# Patient Record
Sex: Male | Born: 1950 | Race: White | Hispanic: No | Marital: Married | Smoking: Never smoker
Health system: Southern US, Community
[De-identification: ages and names within clinical notes are randomized; demographics above are authoritative.]

## PROBLEM LIST (undated history)

## (undated) DIAGNOSIS — I483 Typical atrial flutter: Secondary | ICD-10-CM

## (undated) DIAGNOSIS — M4800 Spinal stenosis, site unspecified: Secondary | ICD-10-CM

## (undated) DIAGNOSIS — M1990 Unspecified osteoarthritis, unspecified site: Secondary | ICD-10-CM

## (undated) DIAGNOSIS — F419 Anxiety disorder, unspecified: Secondary | ICD-10-CM

## (undated) DIAGNOSIS — I509 Heart failure, unspecified: Secondary | ICD-10-CM

## (undated) DIAGNOSIS — I252 Old myocardial infarction: Secondary | ICD-10-CM

## (undated) DIAGNOSIS — I2510 Atherosclerotic heart disease of native coronary artery without angina pectoris: Secondary | ICD-10-CM

## (undated) DIAGNOSIS — I4819 Other persistent atrial fibrillation: Secondary | ICD-10-CM

## (undated) DIAGNOSIS — U071 COVID-19: Secondary | ICD-10-CM

## (undated) DIAGNOSIS — E669 Obesity, unspecified: Secondary | ICD-10-CM

## (undated) DIAGNOSIS — E785 Hyperlipidemia, unspecified: Secondary | ICD-10-CM

## (undated) DIAGNOSIS — Z87442 Personal history of urinary calculi: Secondary | ICD-10-CM

## (undated) DIAGNOSIS — K219 Gastro-esophageal reflux disease without esophagitis: Secondary | ICD-10-CM

## (undated) DIAGNOSIS — I10 Essential (primary) hypertension: Secondary | ICD-10-CM

## (undated) DIAGNOSIS — I480 Paroxysmal atrial fibrillation: Secondary | ICD-10-CM

## (undated) DIAGNOSIS — G4730 Sleep apnea, unspecified: Secondary | ICD-10-CM

## (undated) DIAGNOSIS — R0600 Dyspnea, unspecified: Secondary | ICD-10-CM

## (undated) HISTORY — DX: Atherosclerotic heart disease of native coronary artery without angina pectoris: I25.10

## (undated) HISTORY — DX: Obesity, unspecified: E66.9

## (undated) HISTORY — DX: Essential (primary) hypertension: I10

## (undated) HISTORY — DX: Other persistent atrial fibrillation: I48.19

## (undated) HISTORY — PX: CHOLECYSTECTOMY: SHX55

## (undated) HISTORY — PX: OTHER SURGICAL HISTORY: SHX169

## (undated) HISTORY — DX: Hyperlipidemia, unspecified: E78.5

## (undated) HISTORY — DX: Old myocardial infarction: I25.2

## (undated) HISTORY — PX: LUMBAR LAMINECTOMY: SHX95

## (undated) HISTORY — PX: CORONARY ARTERY BYPASS GRAFT: SHX141

## (undated) HISTORY — DX: Sleep apnea, unspecified: G47.30

## (undated) HISTORY — DX: Unspecified osteoarthritis, unspecified site: M19.90

## (undated) HISTORY — PX: CARPAL TUNNEL RELEASE: SHX101

## (undated) HISTORY — DX: Spinal stenosis, site unspecified: M48.00

---

## 2002-10-05 DIAGNOSIS — I252 Old myocardial infarction: Secondary | ICD-10-CM

## 2002-10-05 HISTORY — DX: Old myocardial infarction: I25.2

## 2009-10-05 HISTORY — PX: BACK SURGERY: SHX140

## 2010-08-01 ENCOUNTER — Encounter: Admission: RE | Admit: 2010-08-01 | Discharge: 2010-08-01 | Payer: Self-pay | Admitting: Neurosurgery

## 2010-08-21 ENCOUNTER — Ambulatory Visit: Payer: Self-pay | Admitting: Cardiovascular Disease

## 2010-08-21 ENCOUNTER — Inpatient Hospital Stay (HOSPITAL_COMMUNITY)
Admission: RE | Admit: 2010-08-21 | Discharge: 2010-08-26 | Payer: Self-pay | Source: Home / Self Care | Admitting: Neurosurgery

## 2010-08-21 ENCOUNTER — Ambulatory Visit: Payer: Self-pay | Admitting: Internal Medicine

## 2010-08-25 HISTORY — PX: TRANSTHORACIC ECHOCARDIOGRAM: SHX275

## 2010-09-08 ENCOUNTER — Ambulatory Visit: Payer: Self-pay | Admitting: Internal Medicine

## 2010-09-08 DIAGNOSIS — F329 Major depressive disorder, single episode, unspecified: Secondary | ICD-10-CM

## 2010-09-08 DIAGNOSIS — E785 Hyperlipidemia, unspecified: Secondary | ICD-10-CM | POA: Insufficient documentation

## 2010-09-08 DIAGNOSIS — I2510 Atherosclerotic heart disease of native coronary artery without angina pectoris: Secondary | ICD-10-CM | POA: Insufficient documentation

## 2010-09-08 DIAGNOSIS — I10 Essential (primary) hypertension: Secondary | ICD-10-CM | POA: Insufficient documentation

## 2010-09-08 DIAGNOSIS — I509 Heart failure, unspecified: Secondary | ICD-10-CM | POA: Insufficient documentation

## 2010-09-08 DIAGNOSIS — F32A Depression, unspecified: Secondary | ICD-10-CM | POA: Insufficient documentation

## 2010-09-08 DIAGNOSIS — K219 Gastro-esophageal reflux disease without esophagitis: Secondary | ICD-10-CM | POA: Insufficient documentation

## 2010-09-10 ENCOUNTER — Ambulatory Visit: Payer: Self-pay | Admitting: Cardiology

## 2010-09-10 ENCOUNTER — Encounter: Payer: Self-pay | Admitting: Internal Medicine

## 2010-09-11 DIAGNOSIS — I219 Acute myocardial infarction, unspecified: Secondary | ICD-10-CM | POA: Insufficient documentation

## 2010-09-11 DIAGNOSIS — M4800 Spinal stenosis, site unspecified: Secondary | ICD-10-CM | POA: Insufficient documentation

## 2010-09-11 DIAGNOSIS — M1990 Unspecified osteoarthritis, unspecified site: Secondary | ICD-10-CM | POA: Insufficient documentation

## 2010-09-12 ENCOUNTER — Ambulatory Visit: Payer: Self-pay | Admitting: Pulmonary Disease

## 2010-09-12 ENCOUNTER — Encounter: Payer: Self-pay | Admitting: Pulmonary Disease

## 2010-09-12 ENCOUNTER — Ambulatory Visit (HOSPITAL_BASED_OUTPATIENT_CLINIC_OR_DEPARTMENT_OTHER)
Admission: RE | Admit: 2010-09-12 | Discharge: 2010-09-12 | Payer: Self-pay | Source: Home / Self Care | Attending: Pulmonary Disease | Admitting: Pulmonary Disease

## 2010-09-12 DIAGNOSIS — G4733 Obstructive sleep apnea (adult) (pediatric): Secondary | ICD-10-CM | POA: Insufficient documentation

## 2010-09-22 ENCOUNTER — Telehealth (INDEPENDENT_AMBULATORY_CARE_PROVIDER_SITE_OTHER): Payer: Self-pay | Admitting: *Deleted

## 2010-10-01 ENCOUNTER — Ambulatory Visit: Payer: Self-pay | Admitting: Pulmonary Disease

## 2010-10-13 ENCOUNTER — Telehealth: Payer: Self-pay | Admitting: Pulmonary Disease

## 2010-10-16 ENCOUNTER — Ambulatory Visit: Payer: Self-pay | Admitting: Cardiology

## 2010-11-04 NOTE — Assessment & Plan Note (Signed)
Summary: New / [facility] / [facility] sched appt/cd   Vital Signs:  Patient profile:   60 year old male Height:      66 inches Weight:      233 pounds BMI:     37.74 O2 Sat:      97 % on Room air Temp:     98.0 degrees F oral Pulse rate:   56 / minute Pulse rhythm:   regular BP sitting:   130 / 66  (left arm) Cuff size:   large  Vitals Entered By: Bill Salinas CMA (September 08, 2010 4:02 PM)  O2 Flow:  Room air  Primary Care Provider:  Etta Grandchild MD   History of Present Illness: New to me he needs a new PCP. He is one month s/p microsurgery of L3 L4 with Dr. Lovell Sheehan for spinal stenosis and had an episode of CHF post-op but is doing much better now.  Hypertension History:      He complains of peripheral edema, but denies headache, chest pain, palpitations, orthopnea, PND, visual symptoms, neurologic problems, syncope, and side effects from treatment.  He notes no problems with any antihypertensive medication side effects.        Positive major cardiovascular risk factors include male age 105 years old or older, hyperlipidemia, and hypertension.  Negative major cardiovascular risk factors include negative family history for ischemic heart disease and non-tobacco-user status.        Positive history for target organ damage include ASHD (either angina/prior MI/prior CABG) and cardiac end organ damage (either CHF or LVH).  Further assessment for target organ damage reveals no history of stroke/TIA, peripheral vascular disease, renal insufficiency, or hypertensive retinopathy.     Preventive Screening-Counseling & Management  Alcohol-Tobacco     Alcohol drinks/day: <1     Alcohol type: beer     >5/day in last 3 mos: no     Alcohol Counseling: not indicated; use of alcohol is not excessive or problematic     Feels need to cut down: no     Feels annoyed by complaints: no     Feels guilty re: drinking: no     Needs 'eye opener' in am: no     Smoking Status: never     Tobacco  Counseling: not indicated; no tobacco use  Caffeine-Diet-Exercise     Does Patient Exercise: no  Hep-HIV-STD-Contraception     Hepatitis Risk: no risk noted     HIV Risk: no risk noted     STD Risk: no risk noted      Sexual History:  currently monogamous.        Drug Use:  no.        Blood Transfusions:  no.    Clinical Review Panels:  Prevention   Last Colonoscopy:  normal (10/05/2005)  Immunizations   Last Tetanus Booster:  Tdap (09/08/2010)   Last Flu Vaccine:  Historical (07/05/2010)   Last Pneumovax:  Historical (08/26/2010)  Diabetes Management   Last Flu Vaccine:  Historical (07/05/2010)   Last Pneumovax:  Historical (08/26/2010)   Medications Prior to Update: 1)  None  Current Medications (verified): 1)  Metoprolol Succinate 50 Mg Xr24h-Tab (Metoprolol Succinate) .... Take 1 Tablet By Mouth Two Times A Day 2)  Amlodipine Besylate 5 Mg Tabs (Amlodipine Besylate) .... Take 1 Tablet By Mouth Two Times A Day 3)  Lipitor 20 Mg Tabs (Atorvastatin Calcium) .... Take 1 Tablet By Mouth Two Times A Day 4)  Diovan  320 Mg Tabs (Valsartan) 5)  Citalopram Hydrobromide 20 Mg Tabs (Citalopram Hydrobromide) .... Take 1 Tablet By Mouth Once A Day 6)  Ecotrin 325 Mg Tbec (Aspirin) .... Take 1 Tablet By Mouth Once A Day 7)  Omeprazole 20 Mg Tbec (Omeprazole) .... 3 Qdtab 8)  Fish Oil 1000 9)  Coq-10 400mg  .... Take 1 Tablet By Mouth Once A Day 10)  Centrium Silver .... Take 1 Tablet By Mouth Once A Day 11)  Furosemide 40 Mg Tabs (Furosemide) .... One By Mouth Two Times A Day  Allergies (verified): 1)  ! Vancomycin  Past History:  Past Medical History: Congestive heart failure Coronary artery disease Depression GERD Hyperlipidemia Hypertension  Past Surgical History: Coronary artery bypass graft Cholecystectomy Hemorrhoidectomy Lumbar laminectomy  Family History: Family History Breast cancer 1st degree relative <50 Family History High cholesterol Family  History Hypertension Family History of Stroke M 1st degree relative <50  Social History: Occupation: Personnel officer Married Never Smoked Alcohol use-yes Drug use-no Regular exercise-no Smoking Status:  never Drug Use:  no Does Patient Exercise:  no Hepatitis Risk:  no risk noted HIV Risk:  no risk noted STD Risk:  no risk noted Sexual History:  currently monogamous Blood Transfusions:  no  Review of Systems       The patient complains of weight gain and peripheral edema.  The patient denies anorexia, fever, weight loss, chest pain, syncope, dyspnea on exertion, prolonged cough, headaches, hemoptysis, abdominal pain, hematuria, unusual weight change, and enlarged lymph nodes.    Physical Exam  General:  alert, well-developed, well-nourished, well-hydrated, appropriate dress, normal appearance, healthy-appearing, cooperative to examination, and overweight-appearing.   Head:  normocephalic, atraumatic, no abnormalities observed, and no abnormalities palpated.   Eyes:  vision grossly intact, pupils equal, pupils round, and pupils reactive to light.   Mouth:  Oral mucosa and oropharynx without lesions or exudates.  Teeth in good repair. Neck:  supple, full ROM, no masses, no thyromegaly, no thyroid nodules or tenderness, no JVD, normal carotid upstroke, no carotid bruits, no cervical lymphadenopathy, and no neck tenderness.   Lungs:  normal respiratory effort, no intercostal retractions, no accessory muscle use, normal breath sounds, no dullness, no fremitus, no crackles, and no wheezes.   Heart:  normal rate, regular rhythm, no murmur, no gallop, no rub, and no JVD.   Abdomen:  soft, non-tender, normal bowel sounds, no distention, no masses, no guarding, no rigidity, no rebound tenderness, no abdominal hernia, no inguinal hernia, no hepatomegaly, and no splenomegaly.   Msk:  normal ROM, no joint tenderness, no joint swelling, no joint warmth, no redness over joints, no joint deformities, no  joint instability, and no crepitation.   Pulses:  R and L carotid,radial,femoral,dorsalis pedis and posterior tibial pulses are full and equal bilaterally Extremities:  1+ left pedal edema and 1+ right pedal edema.   Neurologic:  alert & oriented X3, cranial nerves II-XII intact, strength normal in all extremities, sensation intact to light touch, sensation intact to pinprick, gait normal, DTRs symmetrical and normal, and Romberg negative.   Skin:  turgor normal, color normal, no rashes, no suspicious lesions, no ecchymoses, no petechiae, no purpura, no ulcerations, and no edema.   Cervical Nodes:  no anterior cervical adenopathy and no posterior cervical adenopathy.   Psych:  Cognition and judgment appear intact. Alert and cooperative with normal attention span and concentration. No apparent delusions, illusions, hallucinations   Impression & Recommendations:  Problem # 1:  HYPERTENSION (ICD-401.9) Assessment Improved  His  updated medication list for this problem includes:    Metoprolol Succinate 50 Mg Xr24h-tab (Metoprolol succinate) .Marland Kitchen... Take 1 tablet by mouth two times a day    Amlodipine Besylate 5 Mg Tabs (Amlodipine besylate) .Marland Kitchen... Take 1 tablet by mouth two times a day    Diovan 320 Mg Tabs (Valsartan)    Furosemide 40 Mg Tabs (Furosemide) ..... One by mouth two times a day  BP today: 130/66  10 Yr Risk Heart Disease: [name]  Problem # 2:  CONGESTIVE HEART FAILURE (ICD-428.0) Assessment: Unchanged  His updated medication list for this problem includes:    Metoprolol Succinate 50 Mg Xr24h-tab (Metoprolol succinate) .Marland Kitchen... Take 1 tablet by mouth two times a day    Diovan 320 Mg Tabs (Valsartan)    Ecotrin 325 Mg Tbec (Aspirin) .Marland Kitchen... Take 1 tablet by mouth once a day    Furosemide 40 Mg Tabs (Furosemide) ..... One by mouth two times a day  Complete Medication List: 1)  Metoprolol Succinate 50 Mg Xr24h-tab (Metoprolol succinate) .... Take 1 tablet by mouth two times a day 2)   Amlodipine Besylate 5 Mg Tabs (Amlodipine besylate) .... Take 1 tablet by mouth two times a day 3)  Lipitor 20 Mg Tabs (Atorvastatin calcium) .... Take 1 tablet by mouth two times a day 4)  Diovan 320 Mg Tabs (Valsartan) 5)  Citalopram Hydrobromide 20 Mg Tabs (Citalopram hydrobromide) .... Take 1 tablet by mouth once a day 6)  Ecotrin 325 Mg Tbec (Aspirin) .... Take 1 tablet by mouth once a day 7)  Omeprazole 20 Mg Tbec (Omeprazole) .... 3 qdtab 8)  Fish Oil 1000  9)  Coq-10 400mg   .... Take 1 tablet by mouth once a day 10)  Centrium Silver  .... Take 1 tablet by mouth once a day 11)  Furosemide 40 Mg Tabs (Furosemide) .... One by mouth two times a day  Other Orders: Tdap => 91yrs IM (16109) Admin 1st Vaccine (60454)  Hypertension Assessment/Plan:      The patient's hypertensive risk group is category C: Target organ damage and/or diabetes.  Today's blood pressure is 130/66.  His blood pressure goal is < 140/90.  Immunization & Chemoprophylaxis:    Tetanus vaccine: Tdap  (09/08/2010)    Influenza vaccine: Historical  (07/05/2010)    Pneumovax: Historical  (08/26/2010)  Patient Instructions: 1)  Please schedule a follow-up appointment in 3 months. 2)  Limit your Sodium (Salt) to less than 4 grams a day (slightly less than 1 teaspoon) to prevent fluid retention, swelling, or worsening or symptoms. 3)  It is important that you exercise regularly at least 20 minutes 5 times a week. If you develop chest pain, have severe difficulty breathing, or feel very tired , stop exercising immediately and seek medical attention. 4)  You need to lose weight. Consider a lower calorie diet and regular exercise.  5)  Check your Blood Pressure regularly. If it is above 130/80: you should make an appointment.   Orders Added: 1)  Tdap => 79yrs IM [90715] 2)  Admin 1st Vaccine [90471] 3)  New Patient Level III [99203]   Immunization History:  Influenza Immunization History:    Influenza:  historical  (07/05/2010)  Pneumovax Immunization History:    Pneumovax:  historical (08/26/2010)  Immunizations Administered:  Tetanus Vaccine:    Vaccine Type: Tdap    Site: left deltoid    Mfr: GlaxoSmithKline    Dose: 0.5 ml    Route: IM    Given by: Alvy Beal  Archie CMA    Exp. Date: 07/24/2012    Lot #: [id number]    VIS given: 08/22/08 version given September 08, 2010.   Immunization History:  Influenza Immunization History:    Influenza:  Historical (07/05/2010)  Pneumovax Immunization History:    Pneumovax:  Historical (08/26/2010)  Immunizations Administered:  Tetanus Vaccine:    Vaccine Type: Tdap    Site: left deltoid    Mfr: GlaxoSmithKline    Dose: 0.5 ml    Route: IM    Given by: Rock Nephew CMA    Exp. Date: 07/24/2012    Lot #: [id number]    VIS given: 08/22/08 version given September 08, 2010.  Preventive Care Screening  Colonoscopy:    Date:  10/05/2005    Results:  normal   Last Tetanus Booster:    Date:  10/05/1993    Results:  Historical

## 2010-11-05 ENCOUNTER — Ambulatory Visit: Payer: Self-pay | Admitting: Pulmonary Disease

## 2010-11-05 ENCOUNTER — Ambulatory Visit: Admit: 2010-11-05 | Payer: Self-pay | Admitting: Pulmonary Disease

## 2010-11-06 ENCOUNTER — Telehealth: Payer: Self-pay | Admitting: Pulmonary Disease

## 2010-11-06 NOTE — Assessment & Plan Note (Signed)
Summary: consult for possible osa   Visit Type:  Initial Consult Copy to:  [name] Peter MD Primary Sala Tague/Referring Skyelar Halliday:  Etta Grandchild MD  CC:  Sleep Consult. pt c/o not sleeping and snoring. Marland Kitchen  History of Present Illness: The pt is a 60y/lo male who I have been asked to see for possible osa.  He recently had back surgery, complicated by CHF, and the question was raised about osa.  The pt has been noted to have loud snoring, pauses in his breathing during sleep, and gasping arousals.  He goes to bed at 10pm, and arises at 515am.  He has frequent awakenings at night, and is not rested in the am's upon arising.  He works as an Personnel officer, and stays very busy.  However, he has definite sleep pressure during the day with periods of inactivity.  He will doze watching tv, reading, and has sleep pressure while driving longer distances.  His weight is up 20 pounds over the last 2 years, and his epworth score today is abnormal at 12.  Current Medications (verified): 1)  Metoprolol Succinate 50 Mg Xr24h-Tab (Metoprolol Succinate) .... Take 1 Tablet By Mouth Two Times A Day 2)  Amlodipine Besylate 5 Mg Tabs (Amlodipine Besylate) .... Take 1 Tablet By Mouth Two Times A Day 3)  Lipitor 20 Mg Tabs (Atorvastatin Calcium) .... Once Daily 4)  Diovan 320 Mg Tabs (Valsartan) .... Once Daily 5)  Citalopram Hydrobromide 20 Mg Tabs (Citalopram Hydrobromide) .... Take 1 Tablet By Mouth Once A Day 6)  Ecotrin 325 Mg Tbec (Aspirin) .... Take 1 Tablet By Mouth Once A Day 7)  Omeprazole 20 Mg Tbec (Omeprazole) .... One Tablet Once Every 3 Days 8)  Fish Oil 1000 Mg Caps (Omega-3 Fatty Acids) .... 3 Once Daily 9)  Coq-10 400mg  .... Take 1 Tablet By Mouth Once A Day 10)  Centrium Silver .... Take 1 Tablet By Mouth Once A Day 11)  Furosemide 40 Mg Tabs (Furosemide) .... Once Daily  Allergies (verified): 1)  ! 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 Family History Breast cancer:mother Family History High cholesterol Family History Hypertension: father and mother Family History of Stroke M 1st degree relative <50 CAD--father CVA--father  Social History: Reviewed history from 09/08/2010 and no changes required. Occupation: Personnel officer Married Never Smoked Alcohol use-yes Drug use-no Regular exercise-no  Review of Systems       The patient complains of shortness of breath with activity, acid heartburn, weight change, itching, anxiety, hand/feet swelling, and joint stiffness or pain.  The patient denies shortness of breath at rest, productive cough, non-productive cough, coughing up blood, chest pain, irregular heartbeats, indigestion, loss of appetite, abdominal pain, difficulty swallowing, sore throat, tooth/dental problems, headaches, nasal congestion/difficulty breathing through nose, sneezing, ear ache, depression, rash, change in color of mucus, and fever.    Vital Signs:  Patient profile:   60 year old male Height:      66 inches Weight:      230.25 pounds BMI:     37.30 O2 Sat:  97 % on Room air Temp:     98.1 degrees F oral Pulse rate:   53 / minute BP sitting:   122 / 70  (left arm) Cuff size:   large  Vitals Entered By: Carver Fila (September 12, 2010 8:46 AM)  O2 Flow:  Room air CC: Sleep Consult. pt c/o not sleeping, snoring.  Comments meds and allergies  updated Phone number updated Carver Fila  September 12, 2010 8:46 AM    Physical Exam  General:  obese male in nad Eyes:  PERRLA and EOMI.   Nose:  narrowed with turbinate hypertrophy, no discharge or purulence Mouth:  small post. pharyngeal space, elongation of soft palate and uvula. Neck:  no jvd, tmg, LN Lungs:  clear to auscultation, no wheezing or crackles. Heart:  rrr, no mrg Abdomen:  soft and nontender, bs+ Extremities:  mild pedal edema, no cyanosis  pulses intact distally Neurologic:  alert and oriented, does not appear sleepy currently. moves all 4.   Impression & Recommendations:  Problem # 1:  OBSTRUCTIVE SLEEP APNEA (ICD-327.23) the pt's history is very suspicious for significant osa.  He has loud snoring, nonrestorative sleep, daytime sleepiness, and is obese.  He also has underlying CV disease that puts him at risk for complications.  I have had a long discussion with the pt about sleep apnea, including its impact on QOL and CV health.  I think he needs to have a sleep study for diagnosis, and the pt is agreeable.    Medications Added to Medication List This Visit: 1)  Lipitor 20 Mg Tabs (Atorvastatin calcium) .... Once daily 2)  Diovan 320 Mg Tabs (Valsartan) .... Once daily 3)  Omeprazole 20 Mg Tbec (Omeprazole) .... One tablet once every 3 days 4)  Fish Oil 1000 Mg Caps (Omega-3 fatty acids) .... 3 once daily 5)  Furosemide 40 Mg Tabs (Furosemide) .... Once daily  Other Orders: Sleep Disorder Referral (Sleep Disorder) Consultation Level IV (16109)  Patient Instructions: 1)  will schedule for sleep study, and see you back afterwards to review the results. 2)  work on weight loss

## 2010-11-06 NOTE — Assessment & Plan Note (Signed)
Summary: rov to review sleep study   Visit Type:  Follow-up Copy to:  [name] Peter MD Primary Provider/Referring Provider:  Etta Grandchild MD  CC:  pt here to discuss sleep study results..  History of Present Illness: the pt comes in for review of his recent sleep study.  He was found to have an AHI of 72/hr and desat as low as 79%.  I have reviewed the study in detail with he and his wife, and answered all of their questions.    Current Medications (verified): 1)  Metoprolol Succinate 50 Mg Xr24h-Tab (Metoprolol Succinate) .... Take 1 Tablet By Mouth Two Times A Day 2)  Amlodipine Besylate 5 Mg Tabs (Amlodipine Besylate) .... Take 1 Tablet By Mouth Two Times A Day 3)  Lipitor 20 Mg Tabs (Atorvastatin Calcium) .... Once Daily 4)  Diovan 320 Mg Tabs (Valsartan) .... Once Daily 5)  Citalopram Hydrobromide 20 Mg Tabs (Citalopram Hydrobromide) .... Take 1 Tablet By Mouth Once A Day 6)  Ecotrin 325 Mg Tbec (Aspirin) .... Take 1 Tablet By Mouth Once A Day 7)  Omeprazole 20 Mg Tbec (Omeprazole) .... One Tablet Once Every 3 Days 8)  Fish Oil 1000 Mg Caps (Omega-3 Fatty Acids) .... 3 Once Daily 9)  Coq-10 400mg  .... Take 1 Tablet By Mouth Once A Day 10)  Centrium Silver .... Take 1 Tablet By Mouth Once A Day 11)  Furosemide 40 Mg Tabs (Furosemide) .... Once Daily  Allergies (verified): 1)  ! Vancomycin  Review of Systems       The patient complains of joint stiffness or pain.  The patient denies shortness of breath with activity, shortness of breath at rest, productive cough, non-productive cough, coughing up blood, chest pain, irregular heartbeats, acid heartburn, indigestion, loss of appetite, weight change, abdominal pain, difficulty swallowing, sore throat, tooth/dental problems, headaches, nasal congestion/difficulty breathing through nose, sneezing, itching, ear ache, anxiety, depression, hand/feet swelling, rash, change in color of mucus, and fever.    Vital Signs:  Patient profile:    60 year old male Height:      66 inches Weight:      231.13 pounds O2 Sat:      94 % on Room air Temp:     97.6 degrees F oral Pulse rate:   51 / minute BP sitting:   122 / 58  (left arm) Cuff size:   large  Vitals Entered By: Carver Fila (October 01, 2010 4:11 PM)  O2 Flow:  Room air CC: pt here to discuss sleep study results. Comments meds and allergies updated Phone number updated Carver Fila  October 01, 2010 4:16 PM     Physical Exam  General:  ow male in nad Nose:  no purulence or discharge noted. Extremities:  no significant edema, no cyanosis  Neurologic:  alert, oriented, moves all 4.   Impression & Recommendations:  Problem # 1:  OBSTRUCTIVE SLEEP APNEA (ICD-327.23) the pt has severe osa by his recent sleep study.  I have recommended treatment with cpap while working on weight loss, and the pt is agreeable.  I will set the patient up on cpap at a moderate pressure level to allow for desensitization, and will troubleshoot the device over the next 4-6weeks if needed.  The pt is to call me if having issues with tolerance.  Will then optimize the pressure once patient is able to wear cpap on a consistent basis.  Other Orders: Est. Patient Level III (04540) DME Referral (DME)  Patient  Instructions: 1)  will start on cpap at a moderate level.  Please call if having issues with tolerance 2)  work on weight loss 3)  followup with me in 5 weeks.

## 2010-11-06 NOTE — Progress Notes (Signed)
Summary: sleep test results---APPT SCHEDULED  Phone Note Call from Patient Call back at Home Phone [phone] Call back at Work Phone    Caller: Patient Call For: clance Reason for Call: Lab or Test Results Summary of Call: Request test results. Initial call taken by: Darletta Moll,  September 22, 2010 3:17 PM  Follow-up for Phone Call        Pt had sleep study doen on 09-12-10 and is requesting results. Please advsie if the results are in [facility] look-at folder. Thanks. Carron Curie CMA  September 22, 2010 4:30 PM   [facility], have you read this pt's sleep study results?  Please advise.  Thanks.  Aundra Millet Reynolds LPN  September 23, 2010 11:25 AM   Additional Follow-up for Phone Call Additional follow up Details #1::        go ahead and make a f/u visit with him to review.Marland KitchenMarland Kitchen?next week.  can add in my RN approval if needed. Additional Follow-up by: Barbaraann Share MD,  September 23, 2010 12:33 PM    Additional Follow-up for Phone Call Additional follow up Details #2::    Pt will come for OV to discuss sleep results on Wed., 10/01/2010 @ 4pm with KC.Michel Bickers [facility]  September 23, 2010 1:16 PM

## 2010-11-06 NOTE — Letter (Signed)
Summary: [facility]   Imported By: Lester Lathrop 09/22/2010 10:20:30  _____________________________________________________________________  External Attachment:    Type:   Image     Comment:   External Document

## 2010-11-06 NOTE — Progress Notes (Signed)
Summary: Sinus and eye infection  Phone Note Call from Patient Call back at Home Phone [phone] Call back at [facility] [phone]   Caller: Patient Summary of Call: Patient says using CPAP has resulted in sinus and eye infection.  [name] [name] at [facility] instructed patient to call Dr Shelle Iron for care.  Patient wants call back. Initial call taken by: Leonette Monarch,  October 13, 2010 2:11 PM  Follow-up for Phone Call        Spoke with pt.  He states that he has started using CPAP 3 days ago.  He states that ever since he started using he has had "eyellitis"- red, swollen eyes and also "sinuses are hurting"- he says that his face seems to be swollen when he wakes up, esp around his eyes.  He states that he has "stopped up my nose" before going to sleep and this has helped some.  Pls advise thanks Follow-up by: Vernie Murders,  October 13, 2010 3:43 PM  Additional Follow-up for Phone Call Additional follow up Details #1::        make sure that he has turned his heater on his humidifier up to keep nose moist.  The most common cause of "eyelitis"  is pulling his mask too tight or if it is too loose and blowing air into his eyes.  Needs to try adjusting his mask, turning heat up on humidifier, and using visine for his eyes.  If continues, needs ov. Additional Follow-up by: Barbaraann Share MD,  October 13, 2010 5:19 PM    Additional Follow-up for Phone Call Additional follow up Details #2::    Pt aware of above. Pt states that Mardelle Matte advised to try a new hose. Pt will try new hose and KC recs and will callback if needed. [name] [name] CMA  October 13, 2010 5:22 PM

## 2010-11-12 NOTE — Progress Notes (Signed)
Summary: nos appt  Phone Note Call from Patient   Caller: [email or web address]  Call For: [name] Summary of Call: Rsc nos from 2/1 to 2/14. Initial call taken by: Darletta Moll,  November 06, 2010 10:14 AM

## 2010-11-18 ENCOUNTER — Ambulatory Visit (INDEPENDENT_AMBULATORY_CARE_PROVIDER_SITE_OTHER): Payer: BC Managed Care – PPO | Admitting: Pulmonary Disease

## 2010-11-18 ENCOUNTER — Encounter: Payer: Self-pay | Admitting: Pulmonary Disease

## 2010-11-18 DIAGNOSIS — G4733 Obstructive sleep apnea (adult) (pediatric): Secondary | ICD-10-CM

## 2010-11-19 ENCOUNTER — Encounter: Payer: Self-pay | Admitting: Pulmonary Disease

## 2010-12-02 NOTE — Letter (Signed)
Summary: CMN for CPAP Supplies/[facility]  CMN for CPAP Supplies/[facility]   Imported By: Sherian Rein 11/26/2010 11:40:27  _____________________________________________________________________  External Attachment:    Type:   Image     Comment:   External Document

## 2010-12-02 NOTE — Assessment & Plan Note (Signed)
Summary: rov for management of osa   Copy to:  [name] Peter MD Primary Provider/Referring Provider:  Etta Grandchild MD  CC:  f/u appt for OSA.  States he is wearing his cpap machine every night.  Approx 7 hours per night.  States he still has problems with air blowing in his "sinuses and eyes" but states it has improved.  Marland Kitchen  History of Present Illness: the pt comes in today for f/u of his osa.  He has been on cpap since last visit, and feels that his sleep and daytime alertness is much improved.  He has been wearing cpap compliantly by his download, and his AHI is much improved.  He has no significant mask leaks.  He does c/o air "shooting into my tear ducts", causing eye irritation.  He also has been having ongoing nasal congestion despite adjustments to his heated humidifier.  He has known nasal obstruction, but has not responded to nasal ICS in the past.  He feels these irritate his nose more than anything else.    Medications Prior to Update: 1)  Metoprolol Succinate 50 Mg Xr24h-Tab (Metoprolol Succinate) .... Take 1 Tablet By Mouth Two Times A Day 2)  Amlodipine Besylate 5 Mg Tabs (Amlodipine Besylate) .... Take 1 Tablet By Mouth Two Times A Day 3)  Lipitor 20 Mg Tabs (Atorvastatin Calcium) .... Once Daily 4)  Diovan 320 Mg Tabs (Valsartan) .... Once Daily 5)  Citalopram Hydrobromide 20 Mg Tabs (Citalopram Hydrobromide) .... Take 1 Tablet By Mouth Once A Day 6)  Ecotrin 325 Mg Tbec (Aspirin) .... Take 1 Tablet By Mouth Once A Day 7)  Omeprazole 20 Mg Tbec (Omeprazole) .... One Tablet Once Every 3 Days 8)  Fish Oil 1000 Mg Caps (Omega-3 Fatty Acids) .... 3 Once Daily 9)  Coq-10 400mg  .... Take 1 Tablet By Mouth Once A Day 10)  Centrium Silver .... Take 1 Tablet By Mouth Once A Day 11)  Furosemide 40 Mg Tabs (Furosemide) .... Once Daily  Allergies (verified): 1)  ! Vancomycin  Past History:  Past medical, surgical, family and social histories (including risk factors) reviewed, and no  changes noted (except as noted below).  Past Medical History: Reviewed history from 09/12/2010 and no changes required.  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 Coronary artery bypass graft Cholecystectomy Hemorrhoidectomy Lumbar laminectomy biltaeral carpal tunnel surgery fistula surgery  Family History: Reviewed history from 09/12/2010 and no changes required. Family History Breast cancer:mother Family History High cholesterol Family History Hypertension: father and mother Family History of Stroke M 1st degree relative <50 CAD--father CVA--father  Social History: Reviewed history from 09/08/2010 and no changes required. Occupation: Personnel officer Married Never Smoked Alcohol use-yes Drug use-no Regular exercise-no  Review of Systems       The patient complains of nasal congestion/difficulty breathing through nose.  The patient denies shortness of breath with activity, shortness of breath at rest, productive cough, non-productive cough, coughing up blood, chest pain, irregular heartbeats, acid heartburn, indigestion, loss of appetite, weight change, abdominal pain, difficulty swallowing, sore throat, tooth/dental problems, headaches, sneezing, itching, ear ache, anxiety, depression, hand/feet swelling, joint stiffness or pain, rash, change in color of mucus, and fever.    Vital Signs:  Patient profile:   60 year old male Height:      66 inches Weight:      233.50 pounds O2 Sat:      96 % on Room air Temp:     97.7 degrees F oral Pulse rate:   48 / minute BP sitting:    134 / 72  (right arm) Cuff size:   large  Vitals Entered By: Arman Filter LPN (November 18, 2010 10:45 AM)  O2 Flow:  Room air CC: f/u appt for OSA.  States he is wearing his cpap machine every night.  Approx 7 hours per night.  States he still has problems with air blowing in his "sinuses and eyes" but states it has improved.   Comments Medications reviewed with patient Arman Filter LPN  November 18, 2010 10:52 AM    Physical Exam  General:  ow male in nad Nose:  significant edema or turbs, with significant obstruction bilat.   no skin breakdown or pressure necrosis from cpap mask Mouth:  clear, no exudates. Lungs:  clear Heart:  rrr Extremities:  mild edema, no cyanosis  Neurologic:  alert, appears more awake, moves all 4 without defiict.   Impression & Recommendations:  Problem # 1:  OBSTRUCTIVE SLEEP APNEA (ICD-327.23) the pt has adapted well to the cpap, and feels he is symptomatically much improved.  He is having issues with nasal congestion and "air thru tear duct", both of which I suspect are due to his anatomical abnormalities in his nose with obvious obstruction.  He has adjusted the heat on his humidifier appropriately.  We can work on nasal hygiene, but ultimately may require surgery to fix this issue.  He will consider.  In the meantime, will work on pressure optimization with the auto setting on his machine. Care Plan:  At this point, will arrange for the patient's machine to be changed over to auto mode for 2 weeks to optimize their pressure.  I will review the downloaded data once sent by dme, and also evaluate for compliance, leaks, and residual osa.  I will call the patient and dme to discuss the results, and have the patient's machine set appropriately.  This will serve as the pt's cpap pressure titration.  Other Orders: Est. Patient Level IV (16109) DME Referral (DME)  Patient Instructions: 1)  try afrin over the counter, 2 sprays each nostril each night  before putting cpap on. 2)  think about having nasal surgery to allow cpap to work more effectively without issues 3)  work on weight loss 4)  will have your dme put your machine on auto for 2 weeks with download to me to optimize your pressure. 5)  if you are doing well, followup with me in 6mos.

## 2010-12-16 LAB — CBC
MCH: 29.2 pg (ref 26.0–34.0)
MCHC: 34 g/dL (ref 30.0–36.0)
MCHC: 34.9 g/dL (ref 30.0–36.0)
Platelets: 203 10*3/uL (ref 150–400)
Platelets: 260 10*3/uL (ref 150–400)
RDW: 13.2 % (ref 11.5–15.5)

## 2010-12-16 LAB — MRSA PCR SCREENING: MRSA by PCR: NEGATIVE

## 2010-12-16 LAB — BASIC METABOLIC PANEL
BUN: 28 mg/dL — ABNORMAL HIGH (ref 6–23)
CO2: 28 mEq/L (ref 19–32)
CO2: 31 mEq/L (ref 19–32)
CO2: 32 mEq/L (ref 19–32)
Calcium: 8.6 mg/dL (ref 8.4–10.5)
Calcium: 9.9 mg/dL (ref 8.4–10.5)
Chloride: 98 mEq/L (ref 96–112)
Creatinine, Ser: 0.96 mg/dL (ref 0.4–1.5)
Creatinine, Ser: 1.51 mg/dL — ABNORMAL HIGH (ref 0.4–1.5)
GFR calc Af Amer: >60 mL/min (ref >60)
GFR calc Af Amer: >60 mL/min (ref >60)
GFR calc Af Amer: >60 mL/min (ref >60)
GFR calc non Af Amer: 48 mL/min — ABNORMAL LOW (ref >60)
GFR calc non Af Amer: >60 mL/min (ref >60)
GFR calc non Af Amer: >60 mL/min (ref >60)
Glucose, Bld: 154 mg/dL — ABNORMAL HIGH (ref 70–99)
Glucose, Bld: 93 mg/dL (ref 70–99)
Glucose, Bld: 99 mg/dL (ref 70–99)
Potassium: 3.4 mEq/L — ABNORMAL LOW (ref 3.5–5.1)
Potassium: 4.2 mEq/L (ref 3.5–5.1)
Sodium: 137 mEq/L (ref 135–145)
Sodium: 139 mEq/L (ref 135–145)

## 2010-12-16 LAB — COMPREHENSIVE METABOLIC PANEL
ALT: 16 U/L (ref 0–53)
Albumin: 2.9 g/dL — ABNORMAL LOW (ref 3.5–5.2)
Alkaline Phosphatase: 64 U/L (ref 39–117)
Glucose, Bld: 123 mg/dL — ABNORMAL HIGH (ref 70–99)
Potassium: 3.8 mEq/L (ref 3.5–5.1)
Sodium: 137 mEq/L (ref 135–145)
Total Protein: 6.2 g/dL (ref 6.0–8.3)

## 2010-12-16 LAB — BLOOD GAS, ARTERIAL
Acid-Base Excess: 5.3 mmol/L — ABNORMAL HIGH (ref 0.0–2.0)
Acid-Base Excess: 9.2 mmol/L — ABNORMAL HIGH (ref 0.0–2.0)
Bicarbonate: 28.9 mEq/L — ABNORMAL HIGH (ref 20.0–24.0)
Bicarbonate: 30.8 mEq/L — ABNORMAL HIGH (ref 20.0–24.0)
Bicarbonate: 33.5 mEq/L — ABNORMAL HIGH (ref 20.0–24.0)
Drawn by: 29017
O2 Content: 4 L/min
O2 Saturation: 94.6 %
Patient temperature: 98.6
TCO2: 32.6 mmol/L (ref 0–100)
TCO2: 35 mmol/L (ref 0–100)
pCO2 arterial: 48.7 mmHg — ABNORMAL HIGH (ref 35.0–45.0)
pCO2 arterial: 59.3 mmHg (ref 35.0–45.0)
pO2, Arterial: 74.6 mmHg — ABNORMAL LOW (ref 80.0–100.0)
pO2, Arterial: 82.2 mmHg (ref 80.0–100.0)

## 2010-12-16 LAB — D-DIMER, QUANTITATIVE: D-Dimer, Quant: 0.51 ug/mL-FEU — ABNORMAL HIGH (ref 0.00–0.48)

## 2010-12-16 LAB — CARDIAC PANEL(CRET KIN+CKTOT+MB+TROPI)
CK, MB: 19.9 ng/mL (ref 0.3–4.0)
CK, MB: 7.2 ng/mL (ref 0.3–4.0)
Total CK: 418 U/L — ABNORMAL HIGH (ref 7–232)
Troponin I: 0.03 ng/mL (ref 0.00–0.06)

## 2010-12-16 LAB — SURGICAL PCR SCREEN: Staphylococcus aureus: NEGATIVE

## 2010-12-16 LAB — BRAIN NATRIURETIC PEPTIDE: Pro B Natriuretic peptide (BNP): 310 pg/mL — ABNORMAL HIGH (ref 0.0–100.0)

## 2010-12-16 LAB — GLUCOSE, CAPILLARY: Glucose-Capillary: 117 mg/dL — ABNORMAL HIGH (ref 70–99)

## 2011-01-05 ENCOUNTER — Other Ambulatory Visit: Payer: Self-pay | Admitting: Pulmonary Disease

## 2011-01-05 DIAGNOSIS — G4733 Obstructive sleep apnea (adult) (pediatric): Secondary | ICD-10-CM

## 2011-01-19 ENCOUNTER — Telehealth: Payer: Self-pay | Admitting: Pulmonary Disease

## 2011-01-19 DIAGNOSIS — G4733 Obstructive sleep apnea (adult) (pediatric): Secondary | ICD-10-CM

## 2011-01-19 NOTE — Telephone Encounter (Signed)
Spoke w/ pt and advised order was sent to decrease pressure. Pt verbalized understanding and states he will give this a try and see if it helps.

## 2011-01-19 NOTE — Telephone Encounter (Signed)
Pt can be reached at [phone]. Pt states since cpap pressure was increased he can only sleep approx 3-4 hours at night. He says he can't get his mask tight enough and it is so tight that it has made his cheeks sore and the bridge of his nose sore. Air comes out the sides of the mask. Pls advise.

## 2011-01-19 NOTE — Telephone Encounter (Signed)
Let pt know that order sent to decrease pressure to 13cm

## 2011-01-19 NOTE — Telephone Encounter (Signed)
Called, spoke with pt.  States this is a pressure issue.  States he tries to tighten the mask so it does not leak from the pressure.  Even the times the mask is not leaking, states he still does not sleep as well as he was.  Requesting pressure to be decreased.  Ok with call back tomorrow once order has been placed.

## 2011-01-19 NOTE — Telephone Encounter (Signed)
We can decrease his cpap pressure a little bit and see how he does.  Is it a mask leak issue, or is it that he cannot tolerate the pressure?  If it is a mask leak issue, it is better to leave him on optimal pressure and work on a better mask fit.

## 2011-01-19 NOTE — Telephone Encounter (Signed)
lmomtcb x1 

## 2011-01-19 NOTE — Telephone Encounter (Signed)
Patient phoned stated that he was returning a call to triage, he can be [phone] .Vedia Coffer

## 2011-02-04 ENCOUNTER — Telehealth: Payer: Self-pay | Admitting: Cardiology

## 2011-02-04 NOTE — Telephone Encounter (Signed)
CALL PT REGARDING SWELLING IN BOTTOM OF LEGS. PT TAKING HIS FLUID PILL AND SAID LAST TIME HE WAS TOLD TO INCREASE IT DIDN'T REALLY MAKE THAT BIG OF A DIFFERENCE. MENTIONED THAT SUPPORT HOSE HAD BEEN SPOKEN ABOUT BUT HE NEEDS A SCRIPT FOR THIS. CHART PLACED IN BOX.

## 2011-02-04 NOTE — Telephone Encounter (Addendum)
Called stating he is continuing to have swelling in ankles and calf. States in AM swelling down; but by afternoon swollen again. Has reduced salt intake; keeping legs elevated. States when called in March and increased dose of Lasix x 3 days could not tell difference in decrease swelling. States he had back surgery in Nov and has problems since then. Per Dr. [name] decrease Amlodipine to 5 mg daily. BP has been 135/72 as average. Advised to continue to monitor BP, watch salt intake and keep legs elevated. Will call back if doesn't improve.

## 2011-02-14 ENCOUNTER — Other Ambulatory Visit: Payer: Self-pay | Admitting: Pulmonary Disease

## 2011-02-14 DIAGNOSIS — G4733 Obstructive sleep apnea (adult) (pediatric): Secondary | ICD-10-CM

## 2011-02-19 ENCOUNTER — Other Ambulatory Visit: Payer: Self-pay | Admitting: *Deleted

## 2011-02-19 MED ORDER — FUROSEMIDE 40 MG PO TABS: 40.0000 mg | ORAL_TABLET | Freq: Every day | ORAL | Status: DC

## 2011-02-19 NOTE — Telephone Encounter (Signed)
escribe medication per fax request  

## 2011-04-28 ENCOUNTER — Encounter: Payer: Self-pay | Admitting: Cardiology

## 2011-04-29 ENCOUNTER — Encounter: Payer: Self-pay | Admitting: Cardiology

## 2011-04-29 ENCOUNTER — Ambulatory Visit (INDEPENDENT_AMBULATORY_CARE_PROVIDER_SITE_OTHER): Payer: BC Managed Care – PPO | Admitting: Cardiology

## 2011-04-29 VITALS — BP 154/78 | HR 60 | Ht 66.0 in | Wt 238.8 lb

## 2011-04-29 DIAGNOSIS — I2510 Atherosclerotic heart disease of native coronary artery without angina pectoris: Secondary | ICD-10-CM

## 2011-04-29 DIAGNOSIS — I509 Heart failure, unspecified: Secondary | ICD-10-CM

## 2011-04-29 DIAGNOSIS — I10 Essential (primary) hypertension: Secondary | ICD-10-CM

## 2011-04-29 MED ORDER — FUROSEMIDE 40 MG PO TABS: 40.0000 mg | ORAL_TABLET | Freq: Every day | ORAL | Status: DC

## 2011-04-29 MED ORDER — METOPROLOL SUCCINATE ER 100 MG PO TB24: 100.0000 mg | ORAL_TABLET | Freq: Every day | ORAL | Status: DC

## 2011-04-29 MED ORDER — HYDROCHLOROTHIAZIDE 25 MG PO TABS: 25.0000 mg | ORAL_TABLET | Freq: Every day | ORAL | Status: DC

## 2011-04-29 MED ORDER — VALSARTAN 320 MG PO TABS: 320.0000 mg | ORAL_TABLET | Freq: Every day | ORAL | Status: DC

## 2011-04-29 MED ORDER — AMLODIPINE BESYLATE 5 MG PO TABS: 5.0000 mg | ORAL_TABLET | Freq: Every day | ORAL | Status: DC

## 2011-04-29 MED ORDER — ATORVASTATIN CALCIUM 20 MG PO TABS: 20.0000 mg | ORAL_TABLET | Freq: Every day | ORAL | Status: DC

## 2011-04-29 NOTE — Assessment & Plan Note (Signed)
Details of his prior surgery or not available. He did have a normal nuclear stress test in 2011. He remains asymptomatic. We need to focus on continued risk factor modification.

## 2011-04-29 NOTE — Assessment & Plan Note (Signed)
History of acute volume overload following back surgery. This is felt to be related to IV fluids and diastolic dysfunction. He has had no recurrent symptoms.

## 2011-04-29 NOTE — Progress Notes (Signed)
[name] [name] Date of Birth: 21-Sep-1951   History of Present Illness: Then he is seen today for followup. He still is not fully recover from his back surgery in November. He was making fairly good progress and able to ride a stationary bike and he broke a bone in his right foot. He has not been able to exercise. He admits that he is eating too much. He is also drinking soft drinks. His blood pressure at home has been running anywhere from 135-155 systolic. His back is still bothering him a fair amount. He has gained 10 pounds. He was having problems with increased lower extremity swelling in March. With reduction in his amlodipine dose this has improved. He states he is not eating any salt.  Current Outpatient Prescriptions on File Prior to Visit  Medication Sig Dispense Refill  . aspirin 325 MG EC tablet Take 325 mg by mouth daily.       . citalopram (CELEXA) 20 MG tablet Take 20 mg by mouth daily.        . Coenzyme Q10 (COQ10 PO) Take 400 mg by mouth daily.       . fish oil-omega-3 fatty acids 1000 MG capsule Take 3 g by mouth daily.        . hydrochlorothiazide 25 MG tablet Take 1 tablet (25 mg total) by mouth daily.  30 tablet  5  . Multiple Vitamin (MULTIVITAMIN) tablet Take 1 tablet by mouth daily.        Marland Kitchen omeprazole (PRILOSEC) 20 MG capsule Take 20 mg by mouth daily.        Marland Kitchen DISCONTD: amLODipine (NORVASC) 5 MG tablet Take 5 mg by mouth daily.        Marland Kitchen DISCONTD: atorvastatin (LIPITOR) 20 MG tablet Take 20 mg by mouth daily.        Marland Kitchen DISCONTD: furosemide (LASIX) 40 MG tablet Take 1 tablet (40 mg total) by mouth daily.  30 tablet  5  . DISCONTD: valsartan (DIOVAN) 320 MG tablet Take 320 mg by mouth daily.        Marland Kitchen DISCONTD: metoprolol (TOPROL-XL) 50 MG 24 hr tablet Take 50 mg by mouth 2 (two) times daily.          Allergies  Allergen Reactions  . Percocet (Oxycodone-Acetaminophen)   . Vancomycin     Past Medical History  Diagnosis Date  . Sleep apnea     on CPAP  .  Coronary artery disease   . MI, old 2004  . Hypertension   . Obesity   . OA (osteoarthritis)   . DJD (degenerative joint disease)   . Spinal stenosis   . Edema     Past Surgical History  Procedure Date  . Coronary artery bypass graft   . Back surgery 2011  . Transthoracic echocardiogram 08/25/2010    EF 55-60%  . Right foot fracture     History  Smoking status  . Never Smoker   Smokeless tobacco  . Not on file    History  Alcohol Use No    Family History  Problem Relation Age of Onset  . Breast cancer Mother   . Hypertension Mother   . Coronary artery disease Father   . Stroke Father   . Hypertension Father     Review of Systems: As noted in history of present illness.  All other systems were reviewed and are negative.  Physical Exam: BP 154/78  Pulse 60  Ht 5\' 6"  (1.676 m)  Wt 238 lb 12.8 oz (108.319 kg)  BMI 38.54 kg/m2 He is an obese white male in no acute distress. His HEENT exam is unremarkable. He has no JVD or bruits. Lungs are clear. Cardiac exam reveals a regular rate and rhythm without gallop, murmur, or click. Abdomen is obese, soft, nontender. He has no edema. His pedal pulses are good. He is wearing a soft bruit on the right foot. He is alert and oriented x3. Cranial nerves II through XII are intact. LABORATORY DATA:   Assessment / Plan:

## 2011-04-29 NOTE — Assessment & Plan Note (Signed)
Blood pressure is suboptimally controlled on multiple medications. I think we need to focus on lifestyle modification with increased aerobic activity and weight loss. We discussed at appropriate dietary measures for weight loss. We will followup again in 6 months. If his blood pressure is not improved we may need to consider adding additional medication. I will not increase his amlodipine further since this contributed to his swelling.

## 2011-04-29 NOTE — Patient Instructions (Signed)
Continue current medications.  You need to lose weight. Cut out the sodas and restrict your carbohydrate intake. Decrease your portion sizes.  Try and increase your aerobic activity.  I will see you again in 6 months with fasting lab work.

## 2011-05-19 ENCOUNTER — Ambulatory Visit (INDEPENDENT_AMBULATORY_CARE_PROVIDER_SITE_OTHER): Payer: BC Managed Care – PPO | Admitting: Pulmonary Disease

## 2011-05-19 ENCOUNTER — Encounter: Payer: Self-pay | Admitting: Pulmonary Disease

## 2011-05-19 VITALS — BP 148/70 | HR 53 | Temp 97.6°F | Ht 66.0 in | Wt 241.4 lb

## 2011-05-19 DIAGNOSIS — G4733 Obstructive sleep apnea (adult) (pediatric): Secondary | ICD-10-CM

## 2011-05-19 NOTE — Progress Notes (Signed)
  Subjective:    Patient ID: [name] [name], male    DOB: April 23, 1951, 60 y.o.   MRN: [id number]  HPI Patient comes in today for follow up of his known sleep apnea.  His been wearing CPAP compliantly at his optimal pressure, and has seen significant improvement in his sleep and daytime alertness.  He has had no significant issues with pressure or mask fit.  His current mask will leak at times, but this usually resolves with her cleaning or changing mask.  His wife states that she has not heard any breakthrough snoring or significant apnea.   Review of Systems  Constitutional: Negative for fever and unexpected weight change.  HENT: Negative for ear pain, nosebleeds, congestion, sore throat, rhinorrhea, sneezing, trouble swallowing, dental problem, postnasal drip and sinus pressure.   Eyes: Negative for redness and itching.  Respiratory: Negative for cough, chest tightness, shortness of breath and wheezing.   Cardiovascular: Negative for palpitations and leg swelling.  Gastrointestinal: Negative for nausea and vomiting.  Genitourinary: Negative for dysuria.  Musculoskeletal: Negative for joint swelling.  Skin: Negative for rash.  Neurological: Negative for headaches.  Hematological: Bruises/bleeds easily.  Psychiatric/Behavioral: Negative for dysphoric mood. The patient is not nervous/anxious.        Objective:   Physical Exam Obese male in nad No skin breakdown or pressure necrosis from cpap mask LE without edema, no cyanosis noted Alert, moves all 4.  Does not appear sleepy.        Assessment & Plan:

## 2011-05-19 NOTE — Assessment & Plan Note (Signed)
The patient is doing very well with his CPAP therapy, and is satisfied with his sleep and daytime alertness.  He is having no significant issues with his machine or mask.  I've encouraged him to work aggressively on weight loss, and to keep up with supplies on his CPAP machine.  He will follow up with me in one year.

## 2011-05-19 NOTE — Patient Instructions (Signed)
Continue with cpap, keep up with supplies and mask changes Work on weight loss followup with me in one year.

## 2011-09-11 ENCOUNTER — Ambulatory Visit (INDEPENDENT_AMBULATORY_CARE_PROVIDER_SITE_OTHER): Payer: BC Managed Care – PPO | Admitting: Cardiology

## 2011-09-11 ENCOUNTER — Encounter: Payer: Self-pay | Admitting: Cardiology

## 2011-09-11 VITALS — BP 172/86 | HR 60 | Ht 67.0 in | Wt 245.0 lb

## 2011-09-11 DIAGNOSIS — E785 Hyperlipidemia, unspecified: Secondary | ICD-10-CM

## 2011-09-11 DIAGNOSIS — I10 Essential (primary) hypertension: Secondary | ICD-10-CM

## 2011-09-11 DIAGNOSIS — I2510 Atherosclerotic heart disease of native coronary artery without angina pectoris: Secondary | ICD-10-CM

## 2011-09-11 DIAGNOSIS — E669 Obesity, unspecified: Secondary | ICD-10-CM

## 2011-09-11 LAB — LIPID PANEL
LDL Cholesterol: 97 mg/dL (ref 0–99)
Total CHOL/HDL Ratio: 4
Triglycerides: 97 mg/dL (ref 0.0–149.0)

## 2011-09-11 LAB — BASIC METABOLIC PANEL
BUN: 18 mg/dL (ref 6–23)
CO2: 26 mEq/L (ref 19–32)
Chloride: 107 mEq/L (ref 96–112)
Creatinine, Ser: 1 mg/dL (ref 0.4–1.5)
Potassium: 3.9 mEq/L (ref 3.5–5.1)

## 2011-09-11 LAB — HEPATIC FUNCTION PANEL
ALT: 29 U/L (ref 0–53)
AST: 26 U/L (ref 0–37)
Bilirubin, Direct: 0.1 mg/dL (ref 0.0–0.3)
Total Protein: 7.9 g/dL (ref 6.0–8.3)

## 2011-09-11 MED ORDER — FUROSEMIDE 40 MG PO TABS: 40.0000 mg | ORAL_TABLET | Freq: Every day | ORAL | Status: DC

## 2011-09-11 MED ORDER — METOPROLOL SUCCINATE ER 50 MG PO TB24: ORAL_TABLET | ORAL | Status: DC

## 2011-09-11 MED ORDER — HYDROCHLOROTHIAZIDE 25 MG PO TABS: 25.0000 mg | ORAL_TABLET | Freq: Every day | ORAL | Status: DC

## 2011-09-11 MED ORDER — IRBESARTAN 300 MG PO TABS: 300.0000 mg | ORAL_TABLET | Freq: Every day | ORAL | Status: DC

## 2011-09-11 MED ORDER — ATORVASTATIN CALCIUM 20 MG PO TABS: 20.0000 mg | ORAL_TABLET | Freq: Every day | ORAL | Status: DC

## 2011-09-11 MED ORDER — AMLODIPINE BESYLATE 5 MG PO TABS: 5.0000 mg | ORAL_TABLET | Freq: Every day | ORAL | Status: DC

## 2011-09-11 NOTE — Assessment & Plan Note (Signed)
He has chronic chest pain which has not changed significantly since his bypass surgery in 2004. He had a normal nuclear stress test in 2011. I recommended that he notify us if he has any change in the pattern of his symptoms. I had recommended followup in 6 months but the patient states he will not come back for a year and that if he has any problems he will notify us. The patient has very poor insight into his medical condition and I expect him to do poorly.

## 2011-09-11 NOTE — Progress Notes (Signed)
[name] [name] Date of Birth: Jul 21, 1951   History of Present Illness: [name] [name] is seen today for followup. He complains of persistent back pain that limits his activity. 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 He complains of a lot of arthritic pain. His blood pressures at home have been consistently elevated in the 150-160 systolic range. He eats out a lot. His sodium intake is high. He does have chronic chest pain on daily basis that is unchanged from his previous coronary bypass surgery.  Current Outpatient Prescriptions on File Prior to Visit  Medication Sig Dispense Refill  . aspirin 325 MG EC tablet Take 325 mg by mouth daily.       . citalopram (CELEXA) 20 MG tablet Take 20 mg by mouth daily.        . Coenzyme Q10 (COQ10 PO) Take 400 mg by mouth daily.       . fish oil-omega-3 fatty acids 1000 MG capsule Take 3 g by mouth daily.        . Multiple Vitamin (MULTIVITAMIN) tablet Take 1 tablet by mouth daily.        Marland Kitchen omeprazole (PRILOSEC) 20 MG capsule Take 20 mg by mouth daily as needed.       Marland Kitchen DISCONTD: amLODipine (NORVASC) 5 MG tablet Take 1 tablet (5 mg total) by mouth daily.  30 tablet  5  . DISCONTD: atorvastatin (LIPITOR) 20 MG tablet Take 1 tablet (20 mg total) by mouth daily.  30 tablet  5  . DISCONTD: furosemide (LASIX) 40 MG tablet Take 1 tablet (40 mg total) by mouth daily.  30 tablet  5  . DISCONTD: valsartan (DIOVAN) 320 MG tablet Take 1 tablet (320 mg total) by mouth daily.  30 tablet  5  . DISCONTD: hydrochlorothiazide 25 MG tablet Take 1 tablet (25 mg total) by mouth daily.  30 tablet  5    Allergies  Allergen Reactions  . Vancomycin     Past Medical History  Diagnosis Date  . Sleep apnea     on CPAP  . Coronary artery disease   . MI, old 2004  . Hypertension   . Obesity   . OA (osteoarthritis)   . DJD (degenerative joint disease)   . Spinal stenosis   . Edema     Past Surgical History  Procedure Date  . Coronary artery  bypass graft   . Back surgery 2011  . Transthoracic echocardiogram 08/25/2010    EF 55-60%  . Right foot fracture   . Hemorrhoidectomy   . Lumbar laminectomy   . Carpal tunnel release     bilateral  . Fistula surgery     History  Smoking status  . Never Smoker   Smokeless tobacco  . Not on file    History  Alcohol Use No    Family History  Problem Relation Age of Onset  . Breast cancer Mother   . Hypertension Mother   . Coronary artery disease Father   . Stroke Father   . Hypertension Father     Review of Systems: As noted in history of present illness.  All other systems were reviewed and are negative.  Physical Exam: BP 172/86  Pulse 60  Ht 5\' 7"  (1.702 m)  Wt 245 lb (111.131 kg)  BMI 38.37 kg/m2 He is an obese white male in no acute distress. His HEENT exam is unremarkable. He has no JVD or bruits. Lungs are clear.  Cardiac exam reveals a regular rate and rhythm without gallop, murmur, or click. Abdomen is obese, soft, nontender. He has no edema. His pedal pulses are good. He is wearing a soft bruit on the right foot. He is alert and oriented x3. Cranial nerves II through XII are intact. LABORATORY DATA:   Assessment / Plan:

## 2011-09-11 NOTE — Assessment & Plan Note (Signed)
We will follow up on fasting lab work today including chemistries and a lipid panel. Have encouraged him to lose weight. Given his limited activity I think he will have little success given his inability to make dietary changes.

## 2011-09-11 NOTE — Patient Instructions (Addendum)
We will check blood work today.  We will resume HCTZ 25 mg daily.  I will see you again in 1 year.

## 2011-09-11 NOTE — Assessment & Plan Note (Signed)
Blood pressure is poorly controlled. I recommended resuming HCTZ 25 mg daily. For formulary coverage we will switch his Diovan to Avapro 300 mg daily. He is already on high-dose Toprol and Norvasc. We discussed possibly adding additional therapy but he states that he has had high blood pressure since he was 60 years old and no doctor has ever been able to control it. He seems very poorly motivated to make any lifestyle changes. When we discussed his diet he states that he is doing the best that he can despite knowing that he is eating too much salt and fat. I discussed the deleterious effects this is having on his health with his increased weight gain and poor blood pressure control. He has no motivation to make any changes.

## 2011-09-16 ENCOUNTER — Telehealth: Payer: Self-pay | Admitting: *Deleted

## 2011-09-16 ENCOUNTER — Telehealth: Payer: Self-pay | Admitting: Cardiology

## 2011-09-16 NOTE — Telephone Encounter (Signed)
Fu call  Pt wants to talk to you again about blood work results

## 2011-09-16 NOTE — Telephone Encounter (Signed)
Notified of lab results. Will send to Dr. Sanda Linger

## 2011-09-16 NOTE — Telephone Encounter (Signed)
Called back to find out what glucose was. Advised was 82. Will mail him copy of labs

## 2011-09-16 NOTE — Telephone Encounter (Signed)
Message copied by Lorayne Bender on Wed Sep 16, 2011  4:35 PM ------      Message from: [name], PETER M      Created: Fri Sep 11, 2011  6:31 PM       Chemistries look good. Lipids not bad but LDL goal is 70. Needs to work on his diet and weight loss. Continue same meds.      Theron Arista [name]

## 2011-10-05 DIAGNOSIS — R079 Chest pain, unspecified: Secondary | ICD-10-CM

## 2011-10-19 ENCOUNTER — Ambulatory Visit (INDEPENDENT_AMBULATORY_CARE_PROVIDER_SITE_OTHER): Payer: BC Managed Care – PPO | Admitting: Internal Medicine

## 2011-10-19 VITALS — BP 138/76 | HR 52 | Temp 98.6°F | Wt 245.0 lb

## 2011-10-19 DIAGNOSIS — I10 Essential (primary) hypertension: Secondary | ICD-10-CM

## 2011-10-19 DIAGNOSIS — I2510 Atherosclerotic heart disease of native coronary artery without angina pectoris: Secondary | ICD-10-CM

## 2011-10-19 DIAGNOSIS — F3289 Other specified depressive episodes: Secondary | ICD-10-CM

## 2011-10-19 DIAGNOSIS — F329 Major depressive disorder, single episode, unspecified: Secondary | ICD-10-CM

## 2011-10-19 MED ORDER — CITALOPRAM HYDROBROMIDE 40 MG PO TABS: 40.0000 mg | ORAL_TABLET | Freq: Every day | ORAL | Status: DC

## 2011-10-19 NOTE — Patient Instructions (Signed)

## 2011-10-19 NOTE — Progress Notes (Signed)
  Subjective:    Patient ID: [name] [name], male    DOB: [birth year], 61 y.o.   MRN: [id number]  Hypertension This is a chronic problem. The current episode started more than 1 year ago. The problem has been gradually improving since onset. The problem is controlled. Pertinent negatives include no anxiety, blurred vision, chest pain, headaches, malaise/fatigue, neck pain, orthopnea, palpitations, peripheral edema, PND, shortness of breath or sweats. Past treatments include beta blockers, angiotensin blockers, calcium channel blockers and diuretics. The current treatment provides significant improvement. Compliance problems include exercise and diet.  Hypertensive end-organ damage includes CAD/MI and heart failure.      Review of Systems  Constitutional: Negative.  Negative for malaise/fatigue.  HENT: Negative.  Negative for neck pain.   Eyes: Negative.  Negative for blurred vision.  Respiratory: Negative for cough, chest tightness, shortness of breath, wheezing and stridor.   Cardiovascular: Negative for chest pain, palpitations, orthopnea, leg swelling and PND.  Gastrointestinal: Negative.   Genitourinary: Negative.   Musculoskeletal: Negative.  Negative for myalgias, back pain, joint swelling, arthralgias and gait problem.  Skin: Negative.   Neurological: Negative for headaches.  Psychiatric/Behavioral: Positive for dysphoric mood and agitation (irritable, he wants to try a higher dose of celexa). Negative for suicidal ideas, hallucinations, behavioral problems, confusion, sleep disturbance, self-injury and decreased concentration. The patient is not nervous/anxious and is not hyperactive.        Objective:   Physical Exam  Vitals reviewed. Constitutional: He is oriented to person, place, and time. He appears well-developed and well-nourished. No distress.  HENT:  Head: Normocephalic and atraumatic.  Mouth/Throat: Oropharynx is clear and moist. No oropharyngeal exudate.  Eyes:  Conjunctivae are normal. Right eye exhibits no discharge. Left eye exhibits no discharge. No scleral icterus.  Neck: Normal range of motion. Neck supple. No JVD present. No tracheal deviation present. No thyromegaly present.  Cardiovascular: Normal rate, regular rhythm, normal heart sounds and intact distal pulses.  Exam reveals no gallop and no friction rub.   No murmur heard. Pulmonary/Chest: Effort normal and breath sounds normal. No stridor. No respiratory distress. He has no wheezes. He has no rales. He exhibits no tenderness.  Abdominal: Soft. Bowel sounds are normal. He exhibits no distension and no mass. There is no tenderness. There is no rebound and no guarding.  Musculoskeletal: Normal range of motion. He exhibits no edema and no tenderness.  Lymphadenopathy:    He has no cervical adenopathy.  Neurological: He is oriented to person, place, and time.  Skin: Skin is warm and dry. No rash noted. He is not diaphoretic. No erythema. No pallor.  Psychiatric: He has a normal mood and affect. His behavior is normal. Judgment and thought content normal.     Lab Results  Component Value Date   WBC 12.6* 08/22/2010   HGB 11.8* 08/22/2010   HCT 34.7* 08/22/2010   PLT 203 08/22/2010   GLUCOSE 82 09/11/2011   CHOL 155 09/11/2011   TRIG 97.0 09/11/2011   HDL 38.80* 09/11/2011   LDLCALC 97 09/11/2011   ALT 29 09/11/2011   AST 26 09/11/2011   NA 141 09/11/2011   K 3.9 09/11/2011   CL 107 09/11/2011   CREATININE 1.0 09/11/2011   BUN 18 09/11/2011   CO2 26 09/11/2011   TSH 2.038 08/23/2010       Assessment & Plan:

## 2011-10-20 ENCOUNTER — Encounter: Payer: Self-pay | Admitting: Internal Medicine

## 2011-10-20 NOTE — Assessment & Plan Note (Signed)
Will try the higher dose of celexa

## 2011-10-20 NOTE — Assessment & Plan Note (Signed)
He was admitted recently at [facility] in [place] and he tells me that everything is ok

## 2011-10-20 NOTE — Assessment & Plan Note (Signed)
His Bp is well controlled 

## 2011-10-29 ENCOUNTER — Encounter: Payer: Self-pay | Admitting: Cardiology

## 2011-10-29 ENCOUNTER — Ambulatory Visit (INDEPENDENT_AMBULATORY_CARE_PROVIDER_SITE_OTHER): Payer: BC Managed Care – PPO | Admitting: Cardiology

## 2011-10-29 VITALS — BP 135/73 | HR 55 | Ht 67.0 in | Wt 244.0 lb

## 2011-10-29 DIAGNOSIS — Z951 Presence of aortocoronary bypass graft: Secondary | ICD-10-CM

## 2011-10-29 NOTE — Patient Instructions (Signed)
Continue all current medications. Your physician wants you to follow up in:  1 year.  You will receive a reminder letter in the mail one-two months in advance.  If you don't receive a letter, please call our office to schedule the follow up appointment   

## 2011-10-31 ENCOUNTER — Encounter: Payer: Self-pay | Admitting: Cardiology

## 2011-10-31 DIAGNOSIS — Z951 Presence of aortocoronary bypass graft: Secondary | ICD-10-CM | POA: Insufficient documentation

## 2011-10-31 NOTE — Progress Notes (Signed)
[name] Bottoms, MD, [facility] ABIM Board Certified in Adult Cardiovascular Medicine,Internal Medicine and Critical Care Medicine    CC: followup patient with recent hospitalization for chest pain  HPI:  The patient is a 61 year old male, former patient of Dr. [name].  He is status post coronary bypass grafting 2004 several years ago had a followup catheterization in [place], which showed nonobstructive coronary artery disease.  He was ruled out for myocardial infarction during her recent admission.  He had very atypical chest pain and shoulder pain.  There was no objective evidence of ischemia.  The patient has a long-standing history of chronic back pain which is dealing well with.  We decided he did not need any further agnostic testing after his recent hospitalization.  He has had no recurrent chest pain.  He has no shortness of breath.  He is unable to take nitroglycerin due to drop in blood pressure and side effects.  He is compliant with his medical regimen.  Financially he is in a difficult situation but is able to comply with his meds.  PMH: reviewed and listed in Problem List in Electronic Records (and see below) Past Medical History  Diagnosis Date  . Sleep apnea     on CPAP  . Coronary artery disease   . MI, old 2004  . Hypertension   . Obesity   . OA (osteoarthritis)   . DJD (degenerative joint disease)   . Spinal stenosis   . Edema    Past Surgical History  Procedure Date  . Coronary artery bypass graft     2004  . Back surgery 2011  . Transthoracic echocardiogram 08/25/2010    EF 55-60%  . Right foot fracture   . Hemorrhoidectomy   . Lumbar laminectomy   . Carpal tunnel release     bilateral  . Fistula surgery     Allergies/SH/FHX : available in Electronic Records for review  Allergies  Allergen Reactions  . Vancomycin Other (See Comments)    Was informed after heart surgery ? Breathing problem/patient unsure   History   Social History  . Marital Status:  Married    Spouse Name: [name]    Number of Children: 0  . Years of Education: [name]   Occupational History  . electrician    Social History Main Topics  . Smoking status: Never Smoker   . Smokeless tobacco: Never Used  . Alcohol Use: No  . Drug Use: No  . Sexually Active: Yes   Other Topics Concern  . Not on file   Social History Narrative  . No narrative on file   Family History  Problem Relation Age of Onset  . Breast cancer Mother   . Hypertension Mother   . Coronary artery disease Father   . Stroke Father   . Hypertension Father     Medications: Current Outpatient Prescriptions  Medication Sig Dispense Refill  . amLODipine (NORVASC) 5 MG tablet Take 1 tablet (5 mg total) by mouth daily.  30 tablet  11  . aspirin 325 MG EC tablet Take 325 mg by mouth daily.       Marland Kitchen atorvastatin (LIPITOR) 20 MG tablet Take 1 tablet (20 mg total) by mouth daily.  30 tablet  11  . citalopram (CELEXA) 40 MG tablet Take 1 tablet (40 mg total) by mouth daily.  90 tablet  3  . Coenzyme Q10 (COQ10 PO) Take 400 mg by mouth daily.       . fish oil-omega-3 fatty  acids 1000 MG capsule Take 3 g by mouth daily.        . furosemide (LASIX) 40 MG tablet Take 1 tablet (40 mg total) by mouth daily.  30 tablet  11  . hydrochlorothiazide (HYDRODIURIL) 25 MG tablet Take 1 tablet (25 mg total) by mouth daily.  30 tablet  11  . irbesartan (AVAPRO) 300 MG tablet Take 1 tablet (300 mg total) by mouth at bedtime.  30 tablet  11  . metoprolol (TOPROL-XL) 50 MG 24 hr tablet Take 50 mg twice a day  60 tablet  11  . Multiple Vitamin (MULTIVITAMIN) tablet Take 1 tablet by mouth daily.        Marland Kitchen omeprazole (PRILOSEC) 20 MG capsule Take 20 mg by mouth daily as needed.       Marland Kitchen DISCONTD: valsartan (DIOVAN) 320 MG tablet Take 1 tablet (320 mg total) by mouth daily.  30 tablet  5    ROS: No nausea or vomiting. No fever or chills.No melena or hematochezia.No bleeding.No claudication  Physical Exam: BP 135/73  Pulse 55   Ht 5\' 7"  (1.702 m)  Wt 244 lb (110.678 kg)  BMI 38.22 kg/m2 General:overweight white male in no distress Neck:normal carotid upstroke and no carotid bruits.no thyromegaly.  No nodular thyroid.  JVD is 5 cm Lungs:clear breath sounds bilaterally.  No wheezing Cardiac:regular rate and rhythm with normal S1, S2 and no murmur, rubs or gallops Vascular:no edema.  Normal distal pulses bilaterally Skin:warm and dry Physcologic:Normal affect  12lead ECG:not obtained Limited bedside ECHO:[name]   Patient Active Problem List  Diagnoses  . HYPERLIPIDEMIA  . DEPRESSION  . OBSTRUCTIVE SLEEP APNEA  . HYPERTENSION  . MYOCARDIAL INFARCTION  . CORONARY ARTERY DISEASE  . CONGESTIVE HEART FAILURE-normal ejection fraction 55-60%  . GERD  . DEGENERATIVE JOINT DISEASE  . SPINAL STENOSIS  . Status post coronary artery bypass graftingstatus post coronary bypass grafting 2004    PLAN   Patient is doing well after his recent hospitalization.  He has had no recurrent chest pain.  He is unable to tolerate nitroglycerin.  We make no adjustments in his medications.  There is no indication for formal ischemia testing, but I did tell the patient that he needs to adhere to therapeutic lifestyle changes.  Lipid panel can be followed by his primary care physician

## 2012-05-24 ENCOUNTER — Encounter: Payer: Self-pay | Admitting: Pulmonary Disease

## 2012-05-24 ENCOUNTER — Ambulatory Visit (INDEPENDENT_AMBULATORY_CARE_PROVIDER_SITE_OTHER): Payer: BC Managed Care – PPO | Admitting: Pulmonary Disease

## 2012-05-24 VITALS — BP 112/72 | HR 45 | Temp 97.8°F | Ht 66.0 in | Wt 234.6 lb

## 2012-05-24 DIAGNOSIS — G4733 Obstructive sleep apnea (adult) (pediatric): Secondary | ICD-10-CM

## 2012-05-24 NOTE — Progress Notes (Signed)
  Subjective:    Patient ID: [name] [name], male    DOB: 04-19-1951, 61 y.o.   MRN: [id number]  HPI The patient comes in today for followup of his known sleep apnea.  He is wearing CPAP compliantly, and is having no issues with his pressure or mask.  He feels that he is sleeping well, and has excellent daytime alertness.  He has lost 7 pounds since last visit.   Review of Systems  Constitutional: Negative for fever and unexpected weight change.  HENT: Negative for ear pain, nosebleeds, congestion, sore throat, rhinorrhea, sneezing, trouble swallowing, dental problem, postnasal drip and sinus pressure.   Eyes: Negative for redness and itching.  Respiratory: Negative for cough, chest tightness, shortness of breath and wheezing.   Cardiovascular: Negative for palpitations and leg swelling.  Gastrointestinal: Negative for nausea and vomiting.  Genitourinary: Negative for dysuria.  Musculoskeletal: Negative for joint swelling.  Skin: Negative for rash.  Neurological: Negative for headaches.  Hematological: Bruises/bleeds easily.       From medication  Psychiatric/Behavioral: Negative for dysphoric mood. The patient is nervous/anxious.        Kept under control with medication       Objective:   Physical Exam Overweight male in no acute distress No skin breakdown or pressure necrosis from the CPAP mask Lower extremities without significant edema, no cyanosis Alert and oriented, moves all 4 extremities.  Does not appear to be sleepy.       Assessment & Plan:

## 2012-05-24 NOTE — Assessment & Plan Note (Signed)
The patient is doing extremely well with CPAP, and feels that it has greatly helped his sleep and daytime alertness.  He has lost 7 pounds since the last visit, and I have asked him to continue with this.  I have also encouraged him to keep up with his mask changes and supplies.

## 2012-05-24 NOTE — Patient Instructions (Addendum)
Continue with cpap Work on weight loss.  You are doing great! followup with me in one year, but call if having issues with cpap.

## 2012-06-20 ENCOUNTER — Other Ambulatory Visit: Payer: Self-pay | Admitting: Internal Medicine

## 2012-08-19 ENCOUNTER — Other Ambulatory Visit: Payer: Self-pay | Admitting: *Deleted

## 2012-08-19 DIAGNOSIS — E785 Hyperlipidemia, unspecified: Secondary | ICD-10-CM

## 2012-08-19 DIAGNOSIS — Z79899 Other long term (current) drug therapy: Secondary | ICD-10-CM

## 2012-08-25 ENCOUNTER — Other Ambulatory Visit: Payer: Self-pay | Admitting: *Deleted

## 2012-08-25 ENCOUNTER — Other Ambulatory Visit: Payer: Self-pay | Admitting: Cardiology

## 2012-08-25 MED ORDER — FUROSEMIDE 40 MG PO TABS: 40.0000 mg | ORAL_TABLET | Freq: Every day | ORAL | Status: DC

## 2012-08-25 MED ORDER — HYDROCHLOROTHIAZIDE 25 MG PO TABS: 25.0000 mg | ORAL_TABLET | Freq: Every day | ORAL | Status: DC

## 2012-08-26 ENCOUNTER — Telehealth: Payer: Self-pay | Admitting: *Deleted

## 2012-08-26 NOTE — Telephone Encounter (Signed)
Message copied by Eustace Moore on Fri Aug 26, 2012  1:02 PM ------      Message from: Rollene Rotunda      Created: Thu Aug 25, 2012  7:59 PM       Lipids are OK but I will review further with him at the upcoming appt.

## 2012-08-26 NOTE — Telephone Encounter (Signed)
Patient informed. 

## 2012-09-07 ENCOUNTER — Encounter: Payer: Self-pay | Admitting: Internal Medicine

## 2012-09-07 ENCOUNTER — Ambulatory Visit (INDEPENDENT_AMBULATORY_CARE_PROVIDER_SITE_OTHER): Payer: BC Managed Care – PPO | Admitting: Internal Medicine

## 2012-09-07 VITALS — BP 120/80 | HR 60 | Temp 97.0°F | Resp 16 | Wt 241.5 lb

## 2012-09-07 DIAGNOSIS — F3289 Other specified depressive episodes: Secondary | ICD-10-CM

## 2012-09-07 DIAGNOSIS — F329 Major depressive disorder, single episode, unspecified: Secondary | ICD-10-CM

## 2012-09-07 DIAGNOSIS — E785 Hyperlipidemia, unspecified: Secondary | ICD-10-CM

## 2012-09-07 DIAGNOSIS — I10 Essential (primary) hypertension: Secondary | ICD-10-CM

## 2012-09-07 MED ORDER — CITALOPRAM HYDROBROMIDE 40 MG PO TABS
ORAL_TABLET | ORAL | Status: DC
Start: 2012-09-07 — End: 2013-08-11

## 2012-09-07 NOTE — Assessment & Plan Note (Signed)
He is tolerating lipitor well No labs were done at his request

## 2012-09-07 NOTE — Patient Instructions (Signed)

## 2012-09-07 NOTE — Assessment & Plan Note (Signed)
He is doing well on celexa 

## 2012-09-07 NOTE — Progress Notes (Signed)
Subjective:    Patient ID: [name] [name], male    DOB: [birth year], 61 y.o.   MRN: [id number]  Hypertension This is a chronic problem. The current episode started more than 1 year ago. The problem has been gradually improving since onset. The problem is controlled. Associated symptoms include peripheral edema. Pertinent negatives include no anxiety, blurred vision, chest pain, headaches, neck pain, orthopnea, palpitations, PND, shortness of breath or sweats. Risk factors for coronary artery disease include obesity. Past treatments include calcium channel blockers, diuretics and beta blockers. The current treatment provides significant improvement. Compliance problems include exercise and diet.  Hypertensive end-organ damage includes CAD/MI.      Review of Systems  Constitutional: Positive for unexpected weight change (weight gain). Negative for fever, chills, diaphoresis, activity change, appetite change and fatigue.  HENT: Negative.  Negative for neck pain.   Eyes: Negative.  Negative for blurred vision.  Respiratory: Negative for cough, chest tightness, shortness of breath, wheezing and stridor.   Cardiovascular: Negative for chest pain, palpitations, orthopnea and PND.  Gastrointestinal: Negative for nausea, vomiting, abdominal pain, diarrhea and constipation.  Genitourinary: Negative.   Musculoskeletal: Positive for arthralgias (hands). Negative for myalgias, back pain, joint swelling and gait problem.  Skin: Negative for color change, pallor, rash and wound.  Neurological: Negative for dizziness, syncope, weakness and headaches.  Hematological: Negative for adenopathy. Does not bruise/bleed easily.  Psychiatric/Behavioral: Negative for suicidal ideas, hallucinations, behavioral problems, confusion, sleep disturbance, self-injury, dysphoric mood, decreased concentration and agitation. The patient is not nervous/anxious and is not hyperactive.        Objective:   Physical Exam  Vitals  reviewed. Constitutional: He is oriented to person, place, and time. He appears well-developed and well-nourished. No distress.  HENT:  Head: Normocephalic and atraumatic.  Mouth/Throat: Oropharynx is clear and moist. No oropharyngeal exudate.  Eyes: Conjunctivae normal are normal. Right eye exhibits no discharge. Left eye exhibits no discharge. No scleral icterus.  Neck: Normal range of motion. Neck supple. No JVD present. No tracheal deviation present. No thyromegaly present.  Cardiovascular: Normal rate, regular rhythm, normal heart sounds and intact distal pulses.  Exam reveals no gallop and no friction rub.   No murmur heard. Pulmonary/Chest: Effort normal and breath sounds normal. No stridor. No respiratory distress. He has no wheezes. He has no rales. He exhibits no tenderness.  Abdominal: Soft. Bowel sounds are normal. He exhibits no distension and no mass. There is no tenderness. There is no rebound and no guarding.  Musculoskeletal: Normal range of motion. He exhibits edema (1+ pitting edema in BLE). He exhibits no tenderness.  Lymphadenopathy:    He has no cervical adenopathy.  Neurological: He is oriented to person, place, and time.  Skin: Skin is warm and dry. No rash noted. He is not diaphoretic. No erythema. No pallor.  Psychiatric: He has a normal mood and affect. His behavior is normal. Judgment and thought content normal.     Lab Results  Component Value Date   WBC 12.6* 08/22/2010   HGB 11.8* 08/22/2010   HCT 34.7* 08/22/2010   PLT 203 08/22/2010   GLUCOSE 82 09/11/2011   CHOL 155 09/11/2011   TRIG 97.0 09/11/2011   HDL 38.80* 09/11/2011   LDLCALC 97 09/11/2011   ALT 29 09/11/2011   AST 26 09/11/2011   NA 141 09/11/2011   K 3.9 09/11/2011   CL 107 09/11/2011   CREATININE 1.0 09/11/2011   BUN 18 09/11/2011   CO2 26 09/11/2011   TSH  2.038 08/23/2010       Assessment & Plan:

## 2012-09-07 NOTE — Assessment & Plan Note (Addendum)
His BP is well controlled No labs were done today at his request He was not willing to do a physical today, did not agree to having a PSA done

## 2012-09-08 ENCOUNTER — Ambulatory Visit: Payer: BC Managed Care – PPO | Admitting: Cardiology

## 2012-09-08 ENCOUNTER — Encounter: Payer: Self-pay | Admitting: Physician Assistant

## 2012-09-08 ENCOUNTER — Ambulatory Visit (INDEPENDENT_AMBULATORY_CARE_PROVIDER_SITE_OTHER): Payer: BC Managed Care – PPO | Admitting: Physician Assistant

## 2012-09-08 VITALS — BP 151/77 | HR 55 | Ht 66.0 in | Wt 240.0 lb

## 2012-09-08 DIAGNOSIS — I10 Essential (primary) hypertension: Secondary | ICD-10-CM

## 2012-09-08 DIAGNOSIS — E785 Hyperlipidemia, unspecified: Secondary | ICD-10-CM | POA: Insufficient documentation

## 2012-09-08 DIAGNOSIS — I2510 Atherosclerotic heart disease of native coronary artery without angina pectoris: Secondary | ICD-10-CM

## 2012-09-08 DIAGNOSIS — R001 Bradycardia, unspecified: Secondary | ICD-10-CM | POA: Insufficient documentation

## 2012-09-08 MED ORDER — AMLODIPINE BESYLATE 5 MG PO TABS: 5.0000 mg | ORAL_TABLET | Freq: Every day | ORAL | Status: DC

## 2012-09-08 MED ORDER — ASPIRIN EC 81 MG PO TBEC: 162.0000 mg | DELAYED_RELEASE_TABLET | Freq: Every day | ORAL | Status: DC

## 2012-09-08 MED ORDER — FUROSEMIDE 40 MG PO TABS: 40.0000 mg | ORAL_TABLET | Freq: Every day | ORAL | Status: DC

## 2012-09-08 MED ORDER — IRBESARTAN 300 MG PO TABS: 300.0000 mg | ORAL_TABLET | Freq: Every day | ORAL | Status: DC

## 2012-09-08 MED ORDER — METOPROLOL SUCCINATE ER 50 MG PO TB24: ORAL_TABLET | ORAL | Status: DC

## 2012-09-08 MED ORDER — ATORVASTATIN CALCIUM 20 MG PO TABS: 20.0000 mg | ORAL_TABLET | Freq: Every day | ORAL | Status: DC

## 2012-09-08 MED ORDER — HYDROCHLOROTHIAZIDE 25 MG PO TABS: 25.0000 mg | ORAL_TABLET | Freq: Every day | ORAL | Status: DC

## 2012-09-08 NOTE — Patient Instructions (Signed)
   Decrease Aspirin to 162mg  daily  Refills sent to pharmacy on cardiac medications Continue all current medications. Your physician wants you to follow up in:  1 year.  You will receive a reminder letter in the mail one-two months in advance.  If you don't receive a letter, please call our office to schedule the follow up appointment

## 2012-09-08 NOTE — Progress Notes (Signed)
Primary Cardiologist: Lewayne Bunting, MD   HPI: Scheduled annual followup.  Patient reports no significant change from baseline level of exercise tolerance. Of note, he says he has "severe arthritis", and consequently has chest pain "all the time". However, he denies any symptoms reminiscent of his initial presentation in 2004, prior to undergoing CABG at [facility].   12-lead EKG today, reviewed by me, indicates SB 55 bpm; NSST changes  Allergies  Allergen Reactions  . Vancomycin Other (See Comments)    Was informed after heart surgery ? Breathing problem/patient unsure    Current Outpatient Prescriptions  Medication Sig Dispense Refill  . amLODipine (NORVASC) 5 MG tablet Take 1 tablet (5 mg total) by mouth daily.  30 tablet  11  . aspirin 81 MG tablet Take 2 tablets (162 mg total) by mouth daily.      Marland Kitchen atorvastatin (LIPITOR) 20 MG tablet Take 1 tablet (20 mg total) by mouth daily.  30 tablet  11  . citalopram (CELEXA) 40 MG tablet Take 1 tablet every day  90 tablet  3  . Coenzyme Q10 (COQ10 PO) Take 400 mg by mouth daily.       . fish oil-omega-3 fatty acids 1000 MG capsule Take 3 g by mouth daily.        . furosemide (LASIX) 40 MG tablet Take 1 tablet (40 mg total) by mouth daily.  30 tablet  11  . hydrochlorothiazide (HYDRODIURIL) 25 MG tablet Take 1 tablet (25 mg total) by mouth daily.  30 tablet  11  . irbesartan (AVAPRO) 300 MG tablet Take 1 tablet (300 mg total) by mouth at bedtime.  30 tablet  11  . metoprolol succinate (TOPROL-XL) 50 MG 24 hr tablet Take 50 mg twice a day  60 tablet  11  . Multiple Vitamin (MULTIVITAMIN) tablet Take 1 tablet by mouth daily.        Marland Kitchen omeprazole (PRILOSEC) 20 MG capsule Take 20 mg by mouth daily as needed.       . [DISCONTINUED] amLODipine (NORVASC) 5 MG tablet Take 1 tablet (5 mg total) by mouth daily.  30 tablet  11  . [DISCONTINUED] aspirin 325 MG EC tablet Take 325 mg by mouth daily.       . [DISCONTINUED] atorvastatin (LIPITOR) 20 MG tablet Take  1 tablet (20 mg total) by mouth daily.  30 tablet  11  . [DISCONTINUED] furosemide (LASIX) 40 MG tablet Take 1 tablet (40 mg total) by mouth daily.  30 tablet  11  . [DISCONTINUED] hydrochlorothiazide (HYDRODIURIL) 25 MG tablet Take 1 tablet (25 mg total) by mouth daily.  30 tablet  1  . [DISCONTINUED] irbesartan (AVAPRO) 300 MG tablet Take 1 tablet (300 mg total) by mouth at bedtime.  30 tablet  11  . [DISCONTINUED] metoprolol (TOPROL-XL) 50 MG 24 hr tablet Take 50 mg twice a day  60 tablet  11  . [DISCONTINUED] valsartan (DIOVAN) 320 MG tablet Take 1 tablet (320 mg total) by mouth daily.  30 tablet  5    Past Medical History  Diagnosis Date  . Sleep apnea     on CPAP  . Coronary artery disease     CABG, 2004 [facility])  . MI, old 2004  . Hypertension   . Obesity   . OA (osteoarthritis)   . DJD (degenerative joint disease)   . Spinal stenosis   . Edema     Past Surgical History  Procedure Date  . Coronary artery bypass graft  2004  . Back surgery 2011  . Transthoracic echocardiogram 08/25/2010    EF 55-60%  . Right foot fracture   . Hemorrhoidectomy   . Lumbar laminectomy   . Carpal tunnel release     bilateral  . Fistula surgery     History   Social History  . Marital Status: Married    Spouse Name: [name]    Number of Children: 0  . Years of Education: [name]   Occupational History  . electrician    Social History Main Topics  . Smoking status: Never Smoker   . Smokeless tobacco: Never Used  . Alcohol Use: No  . Drug Use: No  . Sexually Active: Yes   Other Topics Concern  . Not on file   Social History Narrative  . No narrative on file    Family History  Problem Relation Age of Onset  . Breast cancer Mother   . Hypertension Mother   . Coronary artery disease Father   . Stroke Father   . Hypertension Father     ROS: no nausea, vomiting; no fever, chills; no melena, hematochezia; no claudication  PHYSICAL EXAM: BP 151/77  Pulse 55  Ht 5\' 6"   (1.676 m)  Wt 240 lb (108.863 kg)  BMI 38.74 kg/m2 GENERAL: 61 year old male, obese; NAD HEENT: NCAT, PERRLA, EOMI; sclera clear; no xanthelasma NECK: palpable bilateral carotid pulses, no bruits; no JVD; no TM LUNGS: CTA bilaterally CARDIAC: RRR (S1, S2); no significant murmurs; no rubs or gallops ABDOMEN: Protuberant EXTREMETIES: Trace bilateral peripheral edema SKIN: warm/dry; no obvious rash/lesions MUSCULOSKELETAL: no joint deformity NEURO: no focal deficit; NL affect   EKG: reviewed and available in Electronic Records   ASSESSMENT & PLAN:  CORONARY ARTERY DISEASE Quiescent on current medication regimen. Patient reports having had a negative stress test, per Dr. Hyacinth Meeker in [place], approximately 2 years ago, prior to undergoing lower back surgery. Therefore, no further workup indicated and we will reassess clinical status in one year, at which time he will establish with Dr. Antoine Poche. Of note, he agreed to cut back on ASA to 162, given that he has severe arthritis. If his chronic pain worsens with this reduced dose of ASA, however, then we've agreed to go back to the full dose. Patient does report some easy bruisability on this full dose, however, but no overt bleeding.  HYPERLIPIDEMIA We reviewed the results of the recent lipid profile, notable for LDL 93. I suggested increasing Lipitor to 40 daily; however, patient declined, citing adverse effects from the current dose of Lipitor. I then suggested switching to low dose Crestor, but pointing out that this is not available in generic form. He declined this recommendation, as well. Therefore, we agreed to continue with current dose Lipitor, while emphasizing the importance of a low saturated fat diet.  HYPERTENSION Followed by primary M.D.    Gene Kasra Melvin, PAC

## 2012-09-08 NOTE — Assessment & Plan Note (Signed)
We reviewed the results of the recent lipid profile, notable for LDL 93. I suggested increasing Lipitor to 40 daily; however, patient declined, citing adverse effects from the current dose of Lipitor. I then suggested switching to low dose Crestor, but pointing out that this is not available in generic form. He declined this recommendation, as well. Therefore, we agreed to continue with current dose Lipitor, while emphasizing the importance of a low saturated fat diet.

## 2012-09-08 NOTE — Assessment & Plan Note (Signed)
Followed by primary M.D. 

## 2012-09-08 NOTE — Assessment & Plan Note (Signed)
Quiescent on current medication regimen. Patient reports having had a negative stress test, per Dr. Hyacinth Meeker in [place], approximately 2 years ago, prior to undergoing lower back surgery. Therefore, no further workup indicated and we will reassess clinical status in one year, at which time he will establish with Dr. Antoine Poche. Of note, he agreed to cut back on ASA to 162, given that he has severe arthritis. If his chronic pain worsens with this reduced dose of ASA, however, then we've agreed to go back to the full dose. Patient does report some easy bruisability on this full dose, however, but no overt bleeding.

## 2012-10-25 ENCOUNTER — Ambulatory Visit: Payer: BC Managed Care – PPO | Admitting: Cardiology

## 2013-05-29 ENCOUNTER — Other Ambulatory Visit: Payer: Self-pay | Admitting: *Deleted

## 2013-05-29 ENCOUNTER — Telehealth: Payer: Self-pay | Admitting: Pulmonary Disease

## 2013-05-29 DIAGNOSIS — G4733 Obstructive sleep apnea (adult) (pediatric): Secondary | ICD-10-CM

## 2013-05-29 DIAGNOSIS — Z79899 Other long term (current) drug therapy: Secondary | ICD-10-CM

## 2013-05-29 DIAGNOSIS — E785 Hyperlipidemia, unspecified: Secondary | ICD-10-CM

## 2013-05-29 NOTE — Telephone Encounter (Signed)
Order has been sent to [facility]. Nothing further needed.  

## 2013-05-29 NOTE — Progress Notes (Signed)
Per patient request, fasting lab orders faxed to [facility]. Patient request to have this done before visit.

## 2013-05-30 ENCOUNTER — Ambulatory Visit (INDEPENDENT_AMBULATORY_CARE_PROVIDER_SITE_OTHER): Payer: BC Managed Care – PPO | Admitting: Pulmonary Disease

## 2013-05-30 ENCOUNTER — Encounter: Payer: Self-pay | Admitting: Pulmonary Disease

## 2013-05-30 VITALS — BP 128/82 | HR 52 | Temp 97.0°F | Ht 67.0 in | Wt 241.0 lb

## 2013-05-30 DIAGNOSIS — G4733 Obstructive sleep apnea (adult) (pediatric): Secondary | ICD-10-CM

## 2013-05-30 NOTE — Patient Instructions (Addendum)
Continue on cpap, and keep up with mask changes and supplies. Work on weight loss followup with me in one year.  

## 2013-05-30 NOTE — Progress Notes (Signed)
  Subjective:    Patient ID: [name] [name], male    DOB: 10-02-1951, 62 y.o.   MRN: [id number]  HPI Patient comes in today for followup of his obstructive sleep apnea.  He is wearing CPAP compliantly, and feels that he sleeps very well with the device with excellent daytime alertness.  He is due for a mask change and supplies, and tells me that his machine is working properly.  Of note, his weight is up 7 pounds since last visit.   Review of Systems  Constitutional: Negative for fever and unexpected weight change.  HENT: Negative for ear pain, nosebleeds, congestion, sore throat, rhinorrhea, sneezing, trouble swallowing, dental problem, postnasal drip and sinus pressure.   Eyes: Negative for redness and itching.  Respiratory: Negative for cough, chest tightness, shortness of breath and wheezing.   Cardiovascular: Negative for palpitations and leg swelling.  Gastrointestinal: Negative for nausea and vomiting.  Genitourinary: Negative for dysuria.  Musculoskeletal: Negative for joint swelling.  Skin: Negative for rash.  Neurological: Negative for headaches.  Hematological: Does not bruise/bleed easily.  Psychiatric/Behavioral: Negative for dysphoric mood. The patient is not nervous/anxious.        Objective:   Physical Exam Obese male in no acute distress Nose without purulence or discharge noted No skin breakdown or pressure necrosis from the CPAP mask Neck without lymphadenopathy or thyromegaly Lower extremities with minimal edema, no cyanosis Alert and oriented, does not appear to be sleepy, moves all 4 extremities.       Assessment & Plan:

## 2013-05-30 NOTE — Assessment & Plan Note (Signed)
The patient is wearing CPAP compliantly, and feels that it is helping his sleep and daytime alertness.  I've asked him to keep up with his mask changes and supplies, and needs to work aggressively on weight loss.  He will see me again in one year

## 2013-06-20 ENCOUNTER — Ambulatory Visit
Admission: RE | Admit: 2013-06-20 | Discharge: 2013-06-20 | Disposition: A | Discharge status: Home / Self Care | Payer: BC Managed Care – PPO | Source: Ambulatory Visit | Attending: Rheumatology | Admitting: Rheumatology

## 2013-06-20 ENCOUNTER — Other Ambulatory Visit: Payer: Self-pay | Admitting: Rheumatology

## 2013-06-20 DIAGNOSIS — M549 Dorsalgia, unspecified: Secondary | ICD-10-CM

## 2013-07-31 ENCOUNTER — Ambulatory Visit (INDEPENDENT_AMBULATORY_CARE_PROVIDER_SITE_OTHER): Payer: BC Managed Care – PPO | Admitting: Cardiology

## 2013-07-31 ENCOUNTER — Encounter: Payer: Self-pay | Admitting: Cardiology

## 2013-07-31 VITALS — BP 132/74 | HR 59 | Ht 66.0 in | Wt 244.1 lb

## 2013-07-31 DIAGNOSIS — I10 Essential (primary) hypertension: Secondary | ICD-10-CM

## 2013-07-31 DIAGNOSIS — E785 Hyperlipidemia, unspecified: Secondary | ICD-10-CM

## 2013-07-31 DIAGNOSIS — I25810 Atherosclerosis of coronary artery bypass graft(s) without angina pectoris: Secondary | ICD-10-CM

## 2013-07-31 MED ORDER — HYDROCHLOROTHIAZIDE 25 MG PO TABS: 25.0000 mg | ORAL_TABLET | Freq: Every day | ORAL | Status: DC

## 2013-07-31 MED ORDER — AMLODIPINE BESYLATE 5 MG PO TABS: 5.0000 mg | ORAL_TABLET | Freq: Every day | ORAL | Status: DC

## 2013-07-31 MED ORDER — FUROSEMIDE 40 MG PO TABS: 40.0000 mg | ORAL_TABLET | ORAL | Status: DC | PRN

## 2013-07-31 MED ORDER — METOPROLOL SUCCINATE ER 50 MG PO TB24: ORAL_TABLET | ORAL | Status: DC

## 2013-07-31 MED ORDER — IRBESARTAN 300 MG PO TABS: 300.0000 mg | ORAL_TABLET | Freq: Every day | ORAL | Status: DC

## 2013-07-31 MED ORDER — ATORVASTATIN CALCIUM 20 MG PO TABS: 20.0000 mg | ORAL_TABLET | Freq: Every day | ORAL | Status: DC

## 2013-07-31 NOTE — Patient Instructions (Signed)
Your physician recommends that you schedule a follow-up appointment in: 1 year with [name]. You should receive a letter in the mail in 10 months. If you do not receive this letter by August 2015 call our office to schedule this appointment.   Your physician has recommended you make the following change in your medication:  Change : Lasix 1 tablet by mouth if needed for swelling  Continue all other medications the same.   Refills for your cardiac medicine has been sent to your pharmacy.

## 2013-07-31 NOTE — Progress Notes (Signed)
Clinical Summary [name] [name] is a 62 y.o.male seen today for follow up of the following problems.   1. CAD - prior 2 vessel CABG in 2004 at [facility] - apparently had follow up cath in 2006 from [place] with patent grafts. Reports negative stress test in 2011 by Dr Hyacinth Meeker in [place].   - chronic atypical muskoloskeltal chest pain that is unchanged.  - no SOB or DOE. Fairly sedentary lifestyle because of chronic back pain, no significant symptoms at his regular level of activity.  - compliant with meds: ASA, atorva, irbresartan, toprol XL  2. HTN - checks bp at home every 2-3 weeks. Typically around 120s/70s. - compliant with meds  3. Hyperlipidemia - noted some side effects on higher lipitor doses, did not want to increase at last visit - reports labs drawn last week, results not in our system - compliant with current statin  4. OSA - compliant with CPAP  Past Medical History  Diagnosis Date  . Sleep apnea     on CPAP  . Coronary artery disease     2v CABG, 2004 [facility])  . MI, old 2004  . Hypertension   . Obesity   . OA (osteoarthritis)   . DJD (degenerative joint disease)   . Spinal stenosis   . Edema   . HLD (hyperlipidemia)   . Palpitations     Reported history of PVCs  . Chronic chest pain   . Chronic sinus bradycardia      Allergies  Allergen Reactions  . Vancomycin Other (See Comments)    Was informed after heart surgery ? Breathing problem/patient unsure     Current Outpatient Prescriptions  Medication Sig Dispense Refill  . amLODipine (NORVASC) 5 MG tablet Take 1 tablet (5 mg total) by mouth daily.  30 tablet  11  . aspirin 81 MG tablet Take 2 tablets (162 mg total) by mouth daily.      Marland Kitchen atorvastatin (LIPITOR) 20 MG tablet Take 1 tablet (20 mg total) by mouth daily.  30 tablet  11  . citalopram (CELEXA) 40 MG tablet Take 1 tablet every day  90 tablet  3  . Coenzyme Q10 (COQ10 PO) Take 400 mg by mouth daily.       . fish  oil-omega-3 fatty acids 1000 MG capsule Take 3 g by mouth daily.        . furosemide (LASIX) 40 MG tablet Take 1 tablet (40 mg total) by mouth daily.  30 tablet  11  . hydrochlorothiazide (HYDRODIURIL) 25 MG tablet Take 1 tablet (25 mg total) by mouth daily.  30 tablet  11  . irbesartan (AVAPRO) 300 MG tablet Take 1 tablet (300 mg total) by mouth at bedtime.  30 tablet  11  . metoprolol succinate (TOPROL-XL) 50 MG 24 hr tablet Take 50 mg twice a day  60 tablet  11  . Multiple Vitamin (MULTIVITAMIN) tablet Take 1 tablet by mouth daily.        Marland Kitchen omeprazole (PRILOSEC) 20 MG capsule Take 20 mg by mouth daily as needed.       . [DISCONTINUED] valsartan (DIOVAN) 320 MG tablet Take 1 tablet (320 mg total) by mouth daily.  30 tablet  5   No current facility-administered medications for this visit.     Past Surgical History  Procedure Laterality Date  . Coronary artery bypass graft      2004  . Back surgery  2011  . Transthoracic echocardiogram  08/25/2010  EF 55-60%  . Right foot fracture    . Hemorrhoidectomy    . Lumbar laminectomy    . Carpal tunnel release      bilateral  . Fistula surgery       Allergies  Allergen Reactions  . Vancomycin Other (See Comments)    Was informed after heart surgery ? Breathing problem/patient unsure      Family History  Problem Relation Age of Onset  . Breast cancer Mother   . Hypertension Mother   . Coronary artery disease Father   . Stroke Father   . Hypertension Father      Social History Mr. Moure reports that he has never smoked. He has never used smokeless tobacco. Mr. Thwaites reports that he does not drink alcohol.   Review of Systems CONSTITUTIONAL: No weight loss, fever, chills, weakness or fatigue.  HEENT: Eyes: No visual loss, blurred vision, double vision or yellow sclerae.No hearing loss, sneezing, congestion, runny nose or sore throat.  SKIN: No rash or itching.  CARDIOVASCULAR: per HPI RESPIRATORY: No shortness of  breath, cough or sputum.  GASTROINTESTINAL: No anorexia, nausea, vomiting or diarrhea. No abdominal pain or blood.  GENITOURINARY: No burning on urination, no polyuria NEUROLOGICAL: No headache, dizziness, syncope, paralysis, ataxia, numbness or tingling in the extremities. No change in bowel or bladder control.  MUSCULOSKELETAL: chronic back pain LYMPHATICS: No enlarged nodes. No history of splenectomy.  PSYCHIATRIC: No history of depression or anxiety.  ENDOCRINOLOGIC: No reports of sweating, cold or heat intolerance. No polyuria or polydipsia.  Marland Kitchen   Physical Examination p 59 bp 132/74 Wt 244 lbs BMI 39 Gen: resting comfortably, no acute distress HEENT: no scleral icterus, pupils equal round and reactive, no palptable cervical adenopathy,  CV: RRR, no m/r/g,no JVD, no carotid bruits Resp: Clear to auscultation bilaterally GI: abdomen is soft, non-tender, non-distended, normal bowel sounds, no hepatosplenomegaly MSK: extremities are warm, no edema.  Skin: warm, no rash Neuro:  no focal deficits Psych: appropriate affect      Assessment and Plan  1. CAD - no current symptoms - continue current medications, continue risk factor modficiation  2. HTN - at goal, continue current meds  3. Hyperlipidemia - will request recent labs to be sent over - continue current statin, patient reports previous side effects on high doses of statin  4. OSA - continue CPAP  Follow up 1 year      Antoine Poche, M.D., F.A.C.C.

## 2013-08-07 ENCOUNTER — Telehealth: Payer: Self-pay | Admitting: Cardiology

## 2013-08-07 NOTE — Telephone Encounter (Signed)
Patient is calling to see if he can get test results from labs that were done.

## 2013-08-09 NOTE — Telephone Encounter (Signed)
Pt informed of results. Pt requested a copy of labs to be sent to him. I mailed a copy to him 08-09-13 and informed pt that I would do so.       His LDL is above goal (it was 97), his goal is less than 70. We could get him there by increasing his lipitor, however he reports side effects on higher doses. He needs to continue diet and exercise to get it lower, or we may need to discuss increasing his statin dose at our follow up appointments.    Dina Rich MD

## 2013-08-11 ENCOUNTER — Encounter: Payer: Self-pay | Admitting: Internal Medicine

## 2013-08-11 ENCOUNTER — Ambulatory Visit: Payer: BC Managed Care – PPO | Admitting: Cardiology

## 2013-08-11 ENCOUNTER — Ambulatory Visit (INDEPENDENT_AMBULATORY_CARE_PROVIDER_SITE_OTHER): Payer: BC Managed Care – PPO | Admitting: Internal Medicine

## 2013-08-11 VITALS — BP 122/80 | HR 53 | Temp 97.6°F | Resp 16 | Ht 66.0 in | Wt 244.0 lb

## 2013-08-11 DIAGNOSIS — F3289 Other specified depressive episodes: Secondary | ICD-10-CM

## 2013-08-11 DIAGNOSIS — E785 Hyperlipidemia, unspecified: Secondary | ICD-10-CM

## 2013-08-11 DIAGNOSIS — F329 Major depressive disorder, single episode, unspecified: Secondary | ICD-10-CM

## 2013-08-11 DIAGNOSIS — I10 Essential (primary) hypertension: Secondary | ICD-10-CM

## 2013-08-11 DIAGNOSIS — Z0000 Encounter for general adult medical examination without abnormal findings: Secondary | ICD-10-CM

## 2013-08-11 MED ORDER — CITALOPRAM HYDROBROMIDE 40 MG PO TABS: ORAL_TABLET | ORAL | Status: DC

## 2013-08-11 NOTE — Assessment & Plan Note (Signed)
His BP is well controlled Today I will check his lytes and renal function 

## 2013-08-11 NOTE — Assessment & Plan Note (Signed)
Exam done, though it was limited at his request He refused a flu vax today Labs ordered He was referred for a screening colonoscopy Pt ed material was given

## 2013-08-11 NOTE — Progress Notes (Signed)
Pre visit review using our clinic review tool, if applicable. No additional management support is needed unless otherwise documented below in the visit note. 

## 2013-08-11 NOTE — Patient Instructions (Signed)
Health Maintenance, Males A healthy lifestyle and preventative care can promote health and wellness.  Maintain regular health, dental, and eye exams.  Eat a healthy diet. Foods like vegetables, fruits, whole grains, low-fat dairy products, and lean protein foods contain the nutrients you need without too many calories. Decrease your intake of foods high in solid fats, added sugars, and salt. Get information about a proper diet from your caregiver, if necessary.  Regular physical exercise is one of the most important things you can do for your health. Most adults should get at least 150 minutes of moderate-intensity exercise (any activity that increases your heart rate and causes you to sweat) each week. In addition, most adults need muscle-strengthening exercises on 2 or more days a week.   Maintain a healthy weight. The body mass index (BMI) is a screening tool to identify possible weight problems. It provides an estimate of body fat based on height and weight. Your caregiver can help determine your BMI, and can help you achieve or maintain a healthy weight. For adults 20 years and older:  A BMI below 18.5 is considered underweight.  A BMI of 18.5 to 24.9 is normal.  A BMI of 25 to 29.9 is considered overweight.  A BMI of 30 and above is considered obese.  Maintain normal blood lipids and cholesterol by exercising and minimizing your intake of saturated fat. Eat a balanced diet with plenty of fruits and vegetables. Blood tests for lipids and cholesterol should begin at age 20 and be repeated every 5 years. If your lipid or cholesterol levels are high, you are over 50, or you are a high risk for heart disease, you may need your cholesterol levels checked more frequently.Ongoing high lipid and cholesterol levels should be treated with medicines, if diet and exercise are not effective.  If you smoke, find out from your caregiver how to quit. If you do not use tobacco, do not start.  Lung  cancer screening is recommended for adults aged 55 80 years who are at high risk for developing lung cancer because of a history of smoking. Yearly low-dose computed tomography (CT) is recommended for people who have at least a 30-pack-year history of smoking and are a current smoker or have quit within the past 15 years. A pack year of smoking is smoking an average of 1 pack of cigarettes a day for 1 year (for example: 1 pack a day for 30 years or 2 packs a day for 15 years). Yearly screening should continue until the smoker has stopped smoking for at least 15 years. Yearly screening should also be stopped for people who develop a health problem that would prevent them from having lung cancer treatment.  If you choose to drink alcohol, do not exceed 2 drinks per day. One drink is considered to be 12 ounces (355 mL) of beer, 5 ounces (148 mL) of wine, or 1.5 ounces (44 mL) of liquor.  Avoid use of street drugs. Do not share needles with anyone. Ask for help if you need support or instructions about stopping the use of drugs.  High blood pressure causes heart disease and increases the risk of stroke. Blood pressure should be checked at least every 1 to 2 years. Ongoing high blood pressure should be treated with medicines if weight loss and exercise are not effective.  If you are 45 to 60 years old, ask your caregiver if you should take aspirin to prevent heart disease.  Diabetes screening involves taking a blood   sample to check your fasting blood sugar level. This should be done once every 3 years, after age 45, if you are within normal weight and without risk factors for diabetes. Testing should be considered at a younger age or be carried out more frequently if you are overweight and have at least 1 risk factor for diabetes.  Colorectal cancer can be detected and often prevented. Most routine colorectal cancer screening begins at the age of 50 and continues through age 75. However, your caregiver may  recommend screening at an earlier age if you have risk factors for colon cancer. On a yearly basis, your caregiver may provide home test kits to check for hidden blood in the stool. Use of a small camera at the end of a tube, to directly examine the colon (sigmoidoscopy or colonoscopy), can detect the earliest forms of colorectal cancer. Talk to your caregiver about this at age 50, when routine screening begins. Direct examination of the colon should be repeated every 5 to 10 years through age 75, unless early forms of pre-cancerous polyps or small growths are found.  Hepatitis C blood testing is recommended for all people born from 1945 through 1965 and any individual with known risks for hepatitis C.  Healthy men should no longer receive prostate-specific antigen (PSA) blood tests as part of routine cancer screening. Consult with your caregiver about prostate cancer screening.  Testicular cancer screening is not recommended for adolescents or adult males who have no symptoms. Screening includes self-exam, caregiver exam, and other screening tests. Consult with your caregiver about any symptoms you have or any concerns you have about testicular cancer.  Practice safe sex. Use condoms and avoid high-risk sexual practices to reduce the spread of sexually transmitted infections (STIs).  Use sunscreen. Apply sunscreen liberally and repeatedly throughout the day. You should seek shade when your shadow is shorter than you. Protect yourself by wearing long sleeves, pants, a wide-brimmed hat, and sunglasses year round, whenever you are outdoors.  Notify your caregiver of new moles or changes in moles, especially if there is a change in shape or color. Also notify your caregiver if a mole is larger than the size of a pencil eraser.  A one-time screening for abdominal aortic aneurysm (AAA) and surgical repair of large AAAs by sound wave imaging (ultrasonography) is recommended for ages 65 to 75 years who are  current or former smokers.  Stay current with your immunizations. Document Released: 03/19/2008 Document Revised: 01/16/2013 Document Reviewed: 02/16/2011 [employer] Patient Information 2014 [employer].  

## 2013-08-11 NOTE — Progress Notes (Signed)
Subjective:    Patient ID: [name] [name], male    DOB: [birth year], 62 y.o.   MRN: [id number]  Hypertension This is a chronic problem. The current episode started more than 1 year ago. The problem has been gradually improving since onset. The problem is controlled. Pertinent negatives include no anxiety, blurred vision, chest pain, headaches, malaise/fatigue, neck pain, orthopnea, palpitations, peripheral edema, PND, shortness of breath or sweats. Past treatments include calcium channel blockers, beta blockers, diuretics and angiotensin blockers. The current treatment provides significant improvement. Compliance problems include exercise and diet.       Review of Systems  Constitutional: Negative.  Negative for fever, chills, malaise/fatigue, diaphoresis and fatigue.  HENT: Negative.   Eyes: Negative.  Negative for blurred vision.  Respiratory: Negative.  Negative for cough, choking, chest tightness, shortness of breath and stridor.   Cardiovascular: Negative.  Negative for chest pain, palpitations, orthopnea, leg swelling and PND.  Gastrointestinal: Negative.  Negative for nausea, vomiting, abdominal pain, diarrhea, constipation and blood in stool.  Endocrine: Negative.   Genitourinary: Negative.   Musculoskeletal: Positive for arthralgias (knees) and back pain. Negative for gait problem, joint swelling, myalgias, neck pain and neck stiffness.  Skin: Negative.   Allergic/Immunologic: Negative.   Neurological: Negative.  Negative for headaches.  Hematological: Negative.  Negative for adenopathy. Does not bruise/bleed easily.  Psychiatric/Behavioral: Positive for dysphoric mood. Negative for suicidal ideas, hallucinations, behavioral problems, confusion, sleep disturbance, self-injury, decreased concentration and agitation. The patient is not nervous/anxious and is not hyperactive.        Objective:   Physical Exam  Vitals reviewed. Constitutional: He is oriented to person, place, and  time. He appears well-developed and well-nourished. No distress.  HENT:  Head: Normocephalic and atraumatic.  Mouth/Throat: Oropharynx is clear and moist. No oropharyngeal exudate.  Eyes: Conjunctivae are normal. Right eye exhibits no discharge. Left eye exhibits no discharge. No scleral icterus.  Neck: Normal range of motion. Neck supple. No JVD present. No tracheal deviation present. No thyromegaly present.  Cardiovascular: Normal rate, regular rhythm, normal heart sounds and intact distal pulses.  Exam reveals no gallop and no friction rub.   No murmur heard. Pulmonary/Chest: Effort normal and breath sounds normal. No stridor. No respiratory distress. He has no wheezes. He has no rales. He exhibits no tenderness.  Abdominal: Soft. Bowel sounds are normal. He exhibits no distension and no mass. There is no tenderness. There is no rebound and no guarding.  Genitourinary:  He did not agree to a full exam today  Musculoskeletal: Normal range of motion. He exhibits no edema and no tenderness.  Lymphadenopathy:    He has no cervical adenopathy.  Neurological: He is oriented to person, place, and time.  Skin: Skin is warm and dry. No rash noted. He is not diaphoretic. No erythema. No pallor.  Psychiatric: He has a normal mood and affect. His behavior is normal. Judgment and thought content normal.     Lab Results  Component Value Date   WBC 12.6* 08/22/2010   HGB 11.8* 08/22/2010   HCT 34.7* 08/22/2010   PLT 203 08/22/2010   GLUCOSE 82 09/11/2011   CHOL 155 09/11/2011   TRIG 97.0 09/11/2011   HDL 38.80* 09/11/2011   LDLCALC 97 09/11/2011   ALT 29 09/11/2011   AST 26 09/11/2011   NA 141 09/11/2011   K 3.9 09/11/2011   CL 107 09/11/2011   CREATININE 1.0 09/11/2011   BUN 18 09/11/2011   CO2 26 09/11/2011   TSH  2.038 08/23/2010       Assessment & Plan:

## 2013-08-11 NOTE — Assessment & Plan Note (Signed)
He is doing well on celexa 

## 2013-08-11 NOTE — Assessment & Plan Note (Signed)
FLP today 

## 2013-08-22 ENCOUNTER — Ambulatory Visit: Payer: BC Managed Care – PPO | Admitting: Cardiology

## 2013-09-15 ENCOUNTER — Encounter: Payer: Self-pay | Admitting: Internal Medicine

## 2014-01-31 ENCOUNTER — Telehealth: Payer: Self-pay | Admitting: Cardiology

## 2014-01-31 NOTE — Telephone Encounter (Signed)
LM on both #'s for pt to return call.

## 2014-01-31 NOTE — Telephone Encounter (Signed)
Informed pt that Allopurinol is not a medication that our MD's prescribed due to it not being a cardiac medication. Pt informed to contact PCP for medication. Pt verbalized understanding.

## 2014-01-31 NOTE — Telephone Encounter (Signed)
Daughter Roanna Raider) called today stating that he continues to have pain in right knee. History of gout. States that Dr. Hyacinth Meeker ([place], [place] -heart) Called in rx of Allopurinol 300 mg for patient. Patient is wanting to know if [name] can call this medication in for patient since his Old cardiologist gave to him. Please call patient at [phone]  [facility] pharmacy # [phone]

## 2014-05-08 ENCOUNTER — Telehealth: Payer: Self-pay | Admitting: Cardiology

## 2014-05-08 NOTE — Telephone Encounter (Signed)
[name] [name] has upcoming appt with [name] on 07-25-14. States we normally do blood work before appointment. Please advise and call patient.

## 2014-05-09 NOTE — Telephone Encounter (Signed)
Patient notified per last office note - no upcoming labs requested at that time.  Notified via voice mail.

## 2014-05-21 ENCOUNTER — Encounter: Payer: Self-pay | Admitting: Pulmonary Disease

## 2014-05-21 ENCOUNTER — Ambulatory Visit (INDEPENDENT_AMBULATORY_CARE_PROVIDER_SITE_OTHER): Payer: No Typology Code available for payment source | Admitting: Pulmonary Disease

## 2014-05-21 ENCOUNTER — Telehealth: Payer: Self-pay | Admitting: Internal Medicine

## 2014-05-21 VITALS — BP 116/82 | HR 52 | Temp 98.4°F | Ht 66.0 in | Wt 250.2 lb

## 2014-05-21 DIAGNOSIS — F329 Major depressive disorder, single episode, unspecified: Secondary | ICD-10-CM

## 2014-05-21 DIAGNOSIS — F3289 Other specified depressive episodes: Secondary | ICD-10-CM

## 2014-05-21 DIAGNOSIS — G4733 Obstructive sleep apnea (adult) (pediatric): Secondary | ICD-10-CM

## 2014-05-21 MED ORDER — CITALOPRAM HYDROBROMIDE 40 MG PO TABS: ORAL_TABLET | ORAL | Status: DC

## 2014-05-21 NOTE — Assessment & Plan Note (Signed)
The patient is doing very well with CPAP, and feels that it is helping his sleep and daytime alertness. I have asked him to continue on the device, to keep up with mask changes and supplies, and to work aggressively on weight loss. I will see him back in one year.

## 2014-05-21 NOTE — Telephone Encounter (Signed)
Pt came by to request Rx refill on CITALOPRAM HBR TABLETS. Pt states he is almost out. Please contact pt when request is reviewed.

## 2014-05-21 NOTE — Patient Instructions (Signed)
Stay on cpap, and keep up with mask changes and supplies. Work on weight loss the best you can followup with me again in one year.

## 2014-05-21 NOTE — Progress Notes (Signed)
   Subjective:    Patient ID: [name] [name], male    DOB: 29-Jun-1951, 63 y.o.   MRN: [id number]  HPI The patient comes in today for followup of his obstructive sleep apnea. He is wearing CPAP compliantly, and is having no issues with his mask fit or pressure. He sleeps well with the device, and feels that it helped his sleep and daytime alertness. He has gained 9 pounds since last visit, but has hurt his back and has been unable to do any type of activity.   Review of Systems  Constitutional: Negative for fever and unexpected weight change.  HENT: Negative for congestion, dental problem, ear pain, nosebleeds, postnasal drip, rhinorrhea, sinus pressure, sneezing, sore throat and trouble swallowing.   Eyes: Negative for redness and itching.  Respiratory: Negative for cough, chest tightness, shortness of breath and wheezing.   Cardiovascular: Negative for palpitations and leg swelling.  Gastrointestinal: Negative for nausea and vomiting.  Genitourinary: Negative for dysuria.  Musculoskeletal: Negative for joint swelling.  Skin: Negative for rash.  Neurological: Negative for headaches.  Hematological: Does not bruise/bleed easily.  Psychiatric/Behavioral: Negative for dysphoric mood. The patient is not nervous/anxious.        Objective:   Physical Exam Obese male in no acute distress Nose without purulence or discharge noted No skin breakdown or pressure necrosis from the CPAP mask Neck without lymphadenopathy or thyromegaly Lower extremities with mild edema, no cyanosis Alert and oriented, moves all 4 extremities.       Assessment & Plan:

## 2014-05-21 NOTE — Telephone Encounter (Signed)
Done, pt needs to schedule appt w/ PCP to receive further refills

## 2014-05-30 ENCOUNTER — Ambulatory Visit: Payer: BC Managed Care – PPO | Admitting: Pulmonary Disease

## 2014-06-15 ENCOUNTER — Encounter: Payer: Self-pay | Admitting: Internal Medicine

## 2014-06-15 ENCOUNTER — Ambulatory Visit (INDEPENDENT_AMBULATORY_CARE_PROVIDER_SITE_OTHER): Payer: PRIVATE HEALTH INSURANCE | Admitting: Internal Medicine

## 2014-06-15 VITALS — BP 138/62 | HR 52 | Temp 97.8°F | Resp 16 | Ht 66.0 in | Wt 247.0 lb

## 2014-06-15 DIAGNOSIS — K219 Gastro-esophageal reflux disease without esophagitis: Secondary | ICD-10-CM

## 2014-06-15 DIAGNOSIS — M5440 Lumbago with sciatica, unspecified side: Secondary | ICD-10-CM

## 2014-06-15 DIAGNOSIS — M545 Low back pain, unspecified: Secondary | ICD-10-CM

## 2014-06-15 DIAGNOSIS — I10 Essential (primary) hypertension: Secondary | ICD-10-CM

## 2014-06-15 DIAGNOSIS — Z23 Encounter for immunization: Secondary | ICD-10-CM

## 2014-06-15 DIAGNOSIS — E785 Hyperlipidemia, unspecified: Secondary | ICD-10-CM

## 2014-06-15 DIAGNOSIS — I219 Acute myocardial infarction, unspecified: Secondary | ICD-10-CM

## 2014-06-15 DIAGNOSIS — F329 Major depressive disorder, single episode, unspecified: Secondary | ICD-10-CM

## 2014-06-15 DIAGNOSIS — F3289 Other specified depressive episodes: Secondary | ICD-10-CM

## 2014-06-15 DIAGNOSIS — M4800 Spinal stenosis, site unspecified: Secondary | ICD-10-CM

## 2014-06-15 DIAGNOSIS — M5441 Lumbago with sciatica, right side: Secondary | ICD-10-CM

## 2014-06-15 MED ORDER — CITALOPRAM HYDROBROMIDE 40 MG PO TABS: ORAL_TABLET | ORAL | Status: DC

## 2014-06-15 NOTE — Assessment & Plan Note (Signed)
He saw a neurosurgeon and was told to have an MRI done as it appears that he has spinal stenosis MRI ordered

## 2014-06-15 NOTE — Progress Notes (Signed)
Pre visit review using our clinic review tool, if applicable. No additional management support is needed unless otherwise documented below in the visit note. 

## 2014-06-15 NOTE — Assessment & Plan Note (Signed)
His BP is well controlled Will check his lytes and renal function today

## 2014-06-15 NOTE — Assessment & Plan Note (Signed)
He is doing well on the statin Will recheck his FLP today 

## 2014-06-15 NOTE — Progress Notes (Signed)
Subjective:    Patient ID: [name] [name], male    DOB: 1951-06-20, 63 y.o.   MRN: [id number]  Back Pain This is a chronic problem. The current episode started more than 1 year ago. The problem occurs constantly. The problem has been gradually worsening since onset. The pain is present in the lumbar spine. The quality of the pain is described as aching and shooting. The pain radiates to the left thigh and right thigh. The pain is at a severity of 4/10. The pain is moderate. The pain is worse during the day. The symptoms are aggravated by bending, position and standing. Pertinent negatives include no abdominal pain, bladder incontinence, bowel incontinence, chest pain, dysuria, fever, headaches, leg pain, numbness, paresis, paresthesias, pelvic pain, perianal numbness, tingling, weakness or weight loss. Risk factors include obesity. He has tried NSAIDs and analgesics for the symptoms. The treatment provided moderate relief.      Review of Systems  Constitutional: Negative.  Negative for fever, chills, weight loss, diaphoresis, appetite change and fatigue.  HENT: Negative.   Eyes: Negative.   Respiratory: Negative.  Negative for cough, choking, chest tightness, shortness of breath and stridor.   Cardiovascular: Negative.  Negative for chest pain, palpitations and leg swelling.  Gastrointestinal: Negative.  Negative for nausea, vomiting, abdominal pain, diarrhea, constipation, blood in stool and bowel incontinence.  Endocrine: Negative.   Genitourinary: Negative.  Negative for bladder incontinence, dysuria and pelvic pain.  Musculoskeletal: Positive for arthralgias and back pain. Negative for gait problem, joint swelling, myalgias, neck pain and neck stiffness.  Skin: Negative.   Allergic/Immunologic: Negative.   Neurological: Negative.  Negative for tingling, weakness, numbness, headaches and paresthesias.  Hematological: Negative.  Negative for adenopathy. Does not bruise/bleed easily.    Psychiatric/Behavioral: Negative.        Objective:   Physical Exam  Vitals reviewed. Constitutional: He is oriented to person, place, and time. He appears well-developed and well-nourished. No distress.  HENT:  Head: Normocephalic and atraumatic.  Mouth/Throat: Oropharynx is clear and moist. No oropharyngeal exudate.  Eyes: Conjunctivae are normal. Right eye exhibits no discharge. Left eye exhibits no discharge. No scleral icterus.  Neck: Normal range of motion. Neck supple. No JVD present. No tracheal deviation present. No thyromegaly present.  Cardiovascular: Normal rate, regular rhythm, normal heart sounds and intact distal pulses.  Exam reveals no gallop and no friction rub.   No murmur heard. Pulmonary/Chest: Effort normal and breath sounds normal. No stridor. No respiratory distress. He has no wheezes. He has no rales. He exhibits no tenderness.  Abdominal: Soft. Bowel sounds are normal. He exhibits no distension and no mass. There is no tenderness. There is no rebound and no guarding.  Musculoskeletal: He exhibits no edema and no tenderness.       Lumbar back: He exhibits decreased range of motion. He exhibits no tenderness, no bony tenderness, no swelling, no edema, no deformity, no laceration, no pain, no spasm and normal pulse.  Lymphadenopathy:    He has no cervical adenopathy.  Neurological: He is alert and oriented to person, place, and time. He has normal strength. He displays abnormal reflex. He displays no atrophy and no tremor. No cranial nerve deficit or sensory deficit. He exhibits normal muscle tone. He displays a negative Romberg sign. He displays no seizure activity. Coordination and gait normal.  Reflex Scores:      Tricep reflexes are 1+ on the right side and 1+ on the left side.  Bicep reflexes are 1+ on the right side and 1+ on the left side.      Brachioradialis reflexes are 1+ on the right side and 1+ on the left side.      Patellar reflexes are 0 on the  right side and 0 on the left side.      Achilles reflexes are 1+ on the right side and 1+ on the left side. Neg SLR in BLE  Skin: Skin is warm and dry. No rash noted. He is not diaphoretic. No erythema. No pallor.  Psychiatric: He has a normal mood and affect. His behavior is normal. Judgment and thought content normal.     Lab Results  Component Value Date   WBC 12.6* 08/22/2010   HGB 11.8* 08/22/2010   HCT 34.7* 08/22/2010   PLT 203 08/22/2010   GLUCOSE 82 09/11/2011   CHOL 155 09/11/2011   TRIG 97.0 09/11/2011   HDL 38.80* 09/11/2011   LDLCALC 97 09/11/2011   ALT 29 09/11/2011   AST 26 09/11/2011   NA 141 09/11/2011   K 3.9 09/11/2011   CL 107 09/11/2011   CREATININE 1.0 09/11/2011   BUN 18 09/11/2011   CO2 26 09/11/2011   TSH 2.038 08/23/2010       Assessment & Plan:

## 2014-06-15 NOTE — Patient Instructions (Signed)
Back Pain, Adult Low back pain is very common. About 1 in 5 people have back pain.The cause of low back pain is rarely dangerous. The pain often gets better over time.About half of people with a sudden onset of back pain feel better in just 2 weeks. About 8 in 10 people feel better by 6 weeks.  CAUSES Some common causes of back pain include:  Strain of the muscles or ligaments supporting the spine.  Wear and tear (degeneration) of the spinal discs.  Arthritis.  Direct injury to the back. DIAGNOSIS Most of the time, the direct cause of low back pain is not known.However, back pain can be treated effectively even when the exact cause of the pain is unknown.Answering your caregiver's questions about your overall health and symptoms is one of the most accurate ways to make sure the cause of your pain is not dangerous. If your caregiver needs more information, he or she may order lab work or imaging tests (X-rays or MRIs).However, even if imaging tests show changes in your back, this usually does not require surgery. HOME CARE INSTRUCTIONS For many people, back pain returns.Since low back pain is rarely dangerous, it is often a condition that people can learn to manageon their own.   Remain active. It is stressful on the back to sit or stand in one place. Do not sit, drive, or stand in one place for more than 30 minutes at a time. Take short walks on level surfaces as soon as pain allows.Try to increase the length of time you walk each day.  Do not stay in bed.Resting more than 1 or 2 days can delay your recovery.  Do not avoid exercise or work.Your body is made to move.It is not dangerous to be active, even though your back may hurt.Your back will likely heal faster if you return to being active before your pain is gone.  Pay attention to your body when you bend and lift. Many people have less discomfortwhen lifting if they bend their knees, keep the load close to their bodies,and  avoid twisting. Often, the most comfortable positions are those that put less stress on your recovering back.  Find a comfortable position to sleep. Use a firm mattress and lie on your side with your knees slightly bent. If you lie on your back, put a pillow under your knees.  Only take over-the-counter or prescription medicines as directed by your caregiver. Over-the-counter medicines to reduce pain and inflammation are often the most helpful.Your caregiver may prescribe muscle relaxant drugs.These medicines help dull your pain so you can more quickly return to your normal activities and healthy exercise.  Put ice on the injured area.  Put ice in a plastic bag.  Place a towel between your skin and the bag.  Leave the ice on for 15-20 minutes, 03-04 times a day for the first 2 to 3 days. After that, ice and heat may be alternated to reduce pain and spasms.  Ask your caregiver about trying back exercises and gentle massage. This may be of some benefit.  Avoid feeling anxious or stressed.Stress increases muscle tension and can worsen back pain.It is important to recognize when you are anxious or stressed and learn ways to manage it.Exercise is a great option. SEEK MEDICAL CARE IF:  You have pain that is not relieved with rest or medicine.  You have pain that does not improve in 1 week.  You have new symptoms.  You are generally not feeling well. SEEK   IMMEDIATE MEDICAL CARE IF:   You have pain that radiates from your back into your legs.  You develop new bowel or bladder control problems.  You have unusual weakness or numbness in your arms or legs.  You develop nausea or vomiting.  You develop abdominal pain.  You feel faint. Document Released: 09/21/2005 Document Revised: 03/22/2012 Document Reviewed: 01/23/2014 Exit[employer] Patient Information 2015 [employer]. This information is not intended to replace advice given to you by your health care provider. Make sure you  discuss any questions you have with your health care provider.  

## 2014-06-27 ENCOUNTER — Ambulatory Visit
Admission: RE | Admit: 2014-06-27 | Discharge: 2014-06-27 | Disposition: A | Discharge status: Home / Self Care | Payer: PRIVATE HEALTH INSURANCE | Source: Ambulatory Visit | Attending: Internal Medicine | Admitting: Internal Medicine

## 2014-06-27 ENCOUNTER — Encounter: Payer: Self-pay | Admitting: Internal Medicine

## 2014-06-27 DIAGNOSIS — M5441 Lumbago with sciatica, right side: Secondary | ICD-10-CM

## 2014-06-29 DIAGNOSIS — M5416 Radiculopathy, lumbar region: Secondary | ICD-10-CM | POA: Insufficient documentation

## 2014-07-25 ENCOUNTER — Other Ambulatory Visit: Payer: Self-pay | Admitting: Cardiology

## 2014-07-25 ENCOUNTER — Encounter: Payer: Self-pay | Admitting: Cardiology

## 2014-07-25 ENCOUNTER — Ambulatory Visit (INDEPENDENT_AMBULATORY_CARE_PROVIDER_SITE_OTHER): Payer: PRIVATE HEALTH INSURANCE | Admitting: Cardiology

## 2014-07-25 VITALS — BP 153/80 | HR 51 | Ht 66.0 in | Wt 249.0 lb

## 2014-07-25 DIAGNOSIS — E785 Hyperlipidemia, unspecified: Secondary | ICD-10-CM

## 2014-07-25 DIAGNOSIS — R001 Bradycardia, unspecified: Secondary | ICD-10-CM

## 2014-07-25 DIAGNOSIS — I2510 Atherosclerotic heart disease of native coronary artery without angina pectoris: Secondary | ICD-10-CM

## 2014-07-25 DIAGNOSIS — I10 Essential (primary) hypertension: Secondary | ICD-10-CM

## 2014-07-25 LAB — COMPREHENSIVE METABOLIC PANEL
ALK PHOS: 79 U/L (ref 39–117)
ALT: 30 U/L (ref 0–53)
AST: 28 U/L (ref 0–37)
Albumin: 4.3 g/dL (ref 3.5–5.2)
BILIRUBIN TOTAL: 0.8 mg/dL (ref 0.2–1.2)
BUN: 19 mg/dL (ref 6–23)
CO2: 29 mEq/L (ref 19–32)
Calcium: 10 mg/dL (ref 8.4–10.5)
Chloride: 101 mEq/L (ref 96–112)
Creat: 0.98 mg/dL (ref 0.50–1.35)
Glucose, Bld: 99 mg/dL (ref 70–99)
Potassium: 5 mEq/L (ref 3.5–5.3)
SODIUM: 138 meq/L (ref 135–145)
TOTAL PROTEIN: 7.4 g/dL (ref 6.0–8.3)

## 2014-07-25 LAB — TSH: TSH: 2.948 u[IU]/mL (ref 0.350–4.500)

## 2014-07-25 LAB — LIPID PANEL
Cholesterol: 191 mg/dL (ref 0–200)
HDL: 39 mg/dL — ABNORMAL LOW (ref >39)
LDL CALC: 116 mg/dL — AB (ref 0–99)
Total CHOL/HDL Ratio: 4.9 Ratio
Triglycerides: 181 mg/dL — ABNORMAL HIGH (ref <150)
VLDL: 36 mg/dL (ref 0–40)

## 2014-07-25 LAB — HEMOGLOBIN A1C
Hgb A1c MFr Bld: 6 % — ABNORMAL HIGH (ref <5.7)
MEAN PLASMA GLUCOSE: 126 mg/dL — AB (ref <117)

## 2014-07-25 LAB — CBC
HCT: 42.2 % (ref 39.0–52.0)
Hemoglobin: 14.5 g/dL (ref 13.0–17.0)
MCH: 28.8 pg (ref 26.0–34.0)
MCHC: 34.4 g/dL (ref 30.0–36.0)
MCV: 83.7 fL (ref 78.0–100.0)
Platelets: 329 10*3/uL (ref 150–400)
RBC: 5.04 MIL/uL (ref 4.22–5.81)
RDW: 14 % (ref 11.5–15.5)
WBC: 8 10*3/uL (ref 4.0–10.5)

## 2014-07-25 MED ORDER — IRBESARTAN 300 MG PO TABS: 300.0000 mg | ORAL_TABLET | Freq: Every day | ORAL | Status: DC

## 2014-07-25 MED ORDER — FUROSEMIDE 40 MG PO TABS: 40.0000 mg | ORAL_TABLET | ORAL | Status: DC | PRN

## 2014-07-25 MED ORDER — METOPROLOL SUCCINATE ER 50 MG PO TB24: ORAL_TABLET | ORAL | Status: DC

## 2014-07-25 MED ORDER — AMLODIPINE BESYLATE 5 MG PO TABS: 5.0000 mg | ORAL_TABLET | Freq: Every day | ORAL | Status: DC

## 2014-07-25 MED ORDER — METOPROLOL SUCCINATE ER 50 MG PO TB24
ORAL_TABLET | ORAL | Status: DC
Start: 2014-07-25 — End: 2014-07-25

## 2014-07-25 MED ORDER — ATORVASTATIN CALCIUM 20 MG PO TABS: 20.0000 mg | ORAL_TABLET | Freq: Every day | ORAL | Status: DC

## 2014-07-25 MED ORDER — HYDROCHLOROTHIAZIDE 25 MG PO TABS: 25.0000 mg | ORAL_TABLET | Freq: Every day | ORAL | Status: DC

## 2014-07-25 NOTE — Patient Instructions (Addendum)
There were no changes to your medications. Continue as directed.  Labs for TSH, CMET, FLP, CBC, A1C Office will contact with results via phone or letter.   Refills sent to pharmacy. Your physician wants you to follow up in:  1 year.  You will receive a reminder letter in the mail one-two months in advance.  If you don't receive a letter, please call our office to schedule the follow up appointment.

## 2014-07-25 NOTE — Progress Notes (Addendum)
Clinical Summary [name] [name] is a [age] y.o.male seen today for follow up of the following medical problems.  1. CAD  - [facility]  - apparently had follow up cath in 2006 from [place] with patent grafts. Reports negative stress test in 2011 by Dr Hyacinth MeekerMiller in [place].  - chronic atypical muskoloskeltal chest pain that is unchanged.  - no SOB or DOE. Fairly sedentary lifestyle because of chronic back pain, no significant symptoms at his regular level of activity.  - compliant with meds  2. HTN  - checks bp occasionally, typically around 120-130s/60-70s.  - compliant with meds   3. Hyperlipidemia  - noted some side effects on higher lipitor doses, has tolerated lower dose well - no recent lipid panel in our system  4. OSA  - compliant with CPAP  Past Medical History  Diagnosis Date  . Sleep apnea     on CPAP  . Coronary artery disease     2v CABG, 2004 [facility]  . MI, old 2004  . Hypertension   . Obesity   . OA (osteoarthritis)   . DJD (degenerative joint disease)   . Spinal stenosis   . Edema   . HLD (hyperlipidemia)   . Palpitations     Reported history of PVCs  . Chronic chest pain   . Chronic sinus bradycardia      Allergies  Allergen Reactions  . Vancomycin Other (See Comments)    Was informed after heart surgery ? Breathing problem/patient unsure     Current Outpatient Prescriptions  Medication Sig Dispense Refill  . amLODipine (NORVASC) 5 MG tablet Take 1 tablet (5 mg total) by mouth daily.  30 tablet  11  . aspirin 81 MG tablet Take 2 tablets (162 mg total) by mouth daily.      Marland Kitchen. atorvastatin (LIPITOR) 20 MG tablet Take 1 tablet (20 mg total) by mouth daily.  30 tablet  11  . citalopram (CELEXA) 40 MG tablet Take 1 tablet every day  90 tablet  3  . Coenzyme Q10 (COQ10 PO) Take 400 mg by mouth daily.       . fish oil-omega-3 fatty acids 1000 MG capsule Take 3 g by mouth daily.        . furosemide (LASIX) 40  MG tablet Take 1 tablet (40 mg total) by mouth as needed (Swelling).  30 tablet  11  . hydrochlorothiazide (HYDRODIURIL) 25 MG tablet Take 1 tablet (25 mg total) by mouth daily.  30 tablet  11  . irbesartan (AVAPRO) 300 MG tablet Take 1 tablet (300 mg total) by mouth at bedtime.  30 tablet  11  . metoprolol succinate (TOPROL-XL) 50 MG 24 hr tablet Take 50 mg twice a day  60 tablet  11  . Multiple Vitamin (MULTIVITAMIN) tablet Take 1 tablet by mouth daily.        Marland Kitchen. omeprazole (PRILOSEC) 20 MG capsule Take 20 mg by mouth daily as needed.       . [DISCONTINUED] valsartan (DIOVAN) 320 MG tablet Take 1 tablet (320 mg total) by mouth daily.  30 tablet  5   No current facility-administered medications for this visit.     Past Surgical History  Procedure Laterality Date  . Coronary artery bypass graft      2004  . Back surgery  2011  . Transthoracic echocardiogram  08/25/2010    EF 55-60%  . Right foot fracture    .  Hemorrhoidectomy    . Lumbar laminectomy    . Carpal tunnel release      bilateral  . Fistula surgery       Allergies  Allergen Reactions  . Vancomycin Other (See Comments)    Was informed after heart surgery ? Breathing problem/patient unsure      Family History  Problem Relation Age of Onset  . Breast cancer Mother   . Hypertension Mother   . Coronary artery disease Father   . Stroke Father   . Hypertension Father      Social History [name] [name] reports that he has never smoked. He has never used smokeless tobacco. [name] [name] reports that he does not drink alcohol.   Review of Systems CONSTITUTIONAL: No weight loss, fever, chills, weakness or fatigue.  HEENT: Eyes: No visual loss, blurred vision, double vision or yellow sclerae.No hearing loss, sneezing, congestion, runny nose or sore throat.  SKIN: No rash or itching.  CARDIOVASCULAR: per HPI RESPIRATORY: No shortness of breath, cough or sputum.  GASTROINTESTINAL: No anorexia, nausea, vomiting or  diarrhea. No abdominal pain or blood.  GENITOURINARY: No burning on urination, no polyuria NEUROLOGICAL: No headache, dizziness, syncope, paralysis, ataxia, numbness or tingling in the extremities. No change in bowel or bladder control.  MUSCULOSKELETAL: + back pain, + bilateral knee pain LYMPHATICS: No enlarged nodes. No history of splenectomy.  PSYCHIATRIC: No history of depression or anxiety.  ENDOCRINOLOGIC: No reports of sweating, cold or heat intolerance. No polyuria or polydipsia.  Marland Kitchen   Physical Examination p 51 bp 153/80 Wt 249 lbs BMI 40 Gen: resting comfortably, no acute distress HEENT: no scleral icterus, pupils equal round and reactive, no palptable cervical adenopathy,  CV: RRR, no m/r/g, no JVD, no carotid bruits Resp: Clear to auscultation bilaterally GI: abdomen is soft, non-tender, non-distended, normal bowel sounds, no hepatosplenomegaly MSK: extremities are warm, no edema.  Skin: warm, no rash Neuro:  no focal deficits Psych: appropriate affect   07/25/14 Clinic EKG Sinus bradycardia, incomplete RBBB  Assessment and Plan  1. CAD  - no current symptoms  - continue current medications, continue risk factor modficiation   2. HTN  - elevated in clinic, at goal based on home numbers. Continue current meds  3. Hyperlipidemia  - continue current statin, patient reports previous side effects on high doses of statin   4. OSA  - continue CPAP  5. Sinus bradycardia - asymptomatic, no evidence of block on EKG. Continue current beta blocker dose at this time, if progresses likely wean in the future  F/u 1 year    [name] [name], M.D.

## 2014-07-26 ENCOUNTER — Telehealth: Payer: Self-pay | Admitting: *Deleted

## 2014-07-26 NOTE — Telephone Encounter (Signed)
Left message to return call 

## 2014-07-26 NOTE — Telephone Encounter (Signed)
Information relayed to patient

## 2014-07-26 NOTE — Telephone Encounter (Signed)
Message copied by Jerrye Beavers on Thu Jul 26, 2014  1:58 PM ------      Message from: [employer] F      Created: Thu Jul 26, 2014  1:14 PM       Labs overall look good. His cholesterol is above goal, however he has had side effects on higher doses of cholesterol medicine and therefore I would not make any changes. Continue to work on cutting back on fatty foods.            Dominga Ferry MD ------

## 2015-02-26 ENCOUNTER — Other Ambulatory Visit: Payer: Self-pay | Admitting: Internal Medicine

## 2015-03-25 ENCOUNTER — Telehealth: Payer: Self-pay | Admitting: *Deleted

## 2015-03-25 NOTE — Telephone Encounter (Signed)
Patient c/o chest pain rated #4 on a scale of 1-10 (10 being the greatest) with nausea. Patient said his BP is 137/83 & resting HR is 110 and feel like heart is racing. No c/o sob or dizziness. Patient said he did not have any nitroglycerin. Patient advised to go to the ED for chest pain with nausea. Patient verbalized understanding of plan.

## 2015-03-26 NOTE — Telephone Encounter (Signed)
Agree, he should be evaluated in ER  Dominga Ferry MD

## 2015-03-27 ENCOUNTER — Observation Stay (HOSPITAL_COMMUNITY)
Admission: EM | Admit: 2015-03-27 | Discharge: 2015-03-28 | Disposition: A | Discharge status: Home / Self Care | Payer: PRIVATE HEALTH INSURANCE | Attending: Cardiology | Admitting: Cardiology

## 2015-03-27 ENCOUNTER — Emergency Department (HOSPITAL_COMMUNITY): Payer: PRIVATE HEALTH INSURANCE

## 2015-03-27 ENCOUNTER — Encounter (HOSPITAL_COMMUNITY): Payer: Self-pay | Admitting: Emergency Medicine

## 2015-03-27 DIAGNOSIS — I2510 Atherosclerotic heart disease of native coronary artery without angina pectoris: Secondary | ICD-10-CM | POA: Diagnosis not present

## 2015-03-27 DIAGNOSIS — Z7982 Long term (current) use of aspirin: Secondary | ICD-10-CM | POA: Diagnosis not present

## 2015-03-27 DIAGNOSIS — I4891 Unspecified atrial fibrillation: Secondary | ICD-10-CM | POA: Diagnosis not present

## 2015-03-27 DIAGNOSIS — Z6841 Body Mass Index (BMI) 40.0 and over, adult: Secondary | ICD-10-CM | POA: Diagnosis not present

## 2015-03-27 DIAGNOSIS — I4510 Unspecified right bundle-branch block: Secondary | ICD-10-CM | POA: Diagnosis not present

## 2015-03-27 DIAGNOSIS — I252 Old myocardial infarction: Secondary | ICD-10-CM | POA: Diagnosis not present

## 2015-03-27 DIAGNOSIS — Z951 Presence of aortocoronary bypass graft: Secondary | ICD-10-CM | POA: Diagnosis not present

## 2015-03-27 DIAGNOSIS — R0789 Other chest pain: Secondary | ICD-10-CM | POA: Diagnosis not present

## 2015-03-27 DIAGNOSIS — G4733 Obstructive sleep apnea (adult) (pediatric): Secondary | ICD-10-CM | POA: Diagnosis not present

## 2015-03-27 DIAGNOSIS — Z881 Allergy status to other antibiotic agents status: Secondary | ICD-10-CM | POA: Insufficient documentation

## 2015-03-27 DIAGNOSIS — I10 Essential (primary) hypertension: Secondary | ICD-10-CM | POA: Diagnosis not present

## 2015-03-27 DIAGNOSIS — E785 Hyperlipidemia, unspecified: Secondary | ICD-10-CM | POA: Diagnosis present

## 2015-03-27 DIAGNOSIS — I480 Paroxysmal atrial fibrillation: Secondary | ICD-10-CM

## 2015-03-27 DIAGNOSIS — E669 Obesity, unspecified: Secondary | ICD-10-CM | POA: Insufficient documentation

## 2015-03-27 DIAGNOSIS — R0600 Dyspnea, unspecified: Secondary | ICD-10-CM

## 2015-03-27 DIAGNOSIS — G8929 Other chronic pain: Secondary | ICD-10-CM | POA: Diagnosis not present

## 2015-03-27 LAB — BASIC METABOLIC PANEL
ANION GAP: 13 (ref 5–15)
BUN: 18 mg/dL (ref 6–20)
CHLORIDE: 102 mmol/L (ref 101–111)
CO2: 24 mmol/L (ref 22–32)
Calcium: 10.3 mg/dL (ref 8.9–10.3)
Creatinine, Ser: 1.34 mg/dL — ABNORMAL HIGH (ref 0.61–1.24)
GFR calc Af Amer: >60 mL/min (ref >60)
GFR calc non Af Amer: 56 mL/min — ABNORMAL LOW (ref >60)
Glucose, Bld: 136 mg/dL — ABNORMAL HIGH (ref 65–99)
Potassium: 3.7 mmol/L (ref 3.5–5.1)
Sodium: 139 mmol/L (ref 135–145)

## 2015-03-27 LAB — MAGNESIUM: Magnesium: 1.8 mg/dL (ref 1.7–2.4)

## 2015-03-27 LAB — CBC
HEMATOCRIT: 45.8 % (ref 39.0–52.0)
HEMOGLOBIN: 16.1 g/dL (ref 13.0–17.0)
MCH: 29.1 pg (ref 26.0–34.0)
MCHC: 35.2 g/dL (ref 30.0–36.0)
MCV: 82.8 fL (ref 78.0–100.0)
PLATELETS: 317 10*3/uL (ref 150–400)
RBC: 5.53 MIL/uL (ref 4.22–5.81)
RDW: 13.7 % (ref 11.5–15.5)
WBC: 10.1 10*3/uL (ref 4.0–10.5)

## 2015-03-27 LAB — I-STAT TROPONIN, ED: Troponin i, poc: 0.01 ng/mL (ref 0.00–0.08)

## 2015-03-27 LAB — TROPONIN I: TROPONIN I: 0.06 ng/mL — AB (ref <0.031)

## 2015-03-27 LAB — BRAIN NATRIURETIC PEPTIDE: B Natriuretic Peptide: 69.9 pg/mL (ref 0.0–100.0)

## 2015-03-27 LAB — TSH: TSH: 4.606 u[IU]/mL — AB (ref 0.350–4.500)

## 2015-03-27 MED ORDER — DILTIAZEM LOAD VIA INFUSION
10.0000 mg | Freq: Once | INTRAVENOUS | Status: AC
  Administered 2015-03-27: 10 mg via INTRAVENOUS

## 2015-03-27 MED ORDER — ASPIRIN 325 MG PO TABS: 325.0000 mg | ORAL_TABLET | ORAL | Status: DC

## 2015-03-27 MED ORDER — FUROSEMIDE 10 MG/ML IJ SOLN
60.0000 mg | Freq: Once | INTRAMUSCULAR | Status: AC
  Administered 2015-03-27: 60 mg via INTRAVENOUS
  Filled 2015-03-27: qty 6

## 2015-03-27 MED ORDER — DEXTROSE 5 % IV SOLN
5.0000 mg/h | INTRAVENOUS | Status: DC
  Administered 2015-03-27: 5 mg/h via INTRAVENOUS

## 2015-03-27 MED ORDER — SODIUM CHLORIDE 0.9 % IV BOLUS (SEPSIS): 500.0000 mL | Freq: Once | INTRAVENOUS | Status: DC

## 2015-03-27 MED ORDER — NITROGLYCERIN 0.4 MG SL SUBL
0.4000 mg | SUBLINGUAL_TABLET | SUBLINGUAL | Status: DC | PRN
  Filled 2015-03-27: qty 1

## 2015-03-27 MED ORDER — LORAZEPAM 2 MG/ML IJ SOLN
0.5000 mg | Freq: Once | INTRAMUSCULAR | Status: AC
  Administered 2015-03-27: 0.5 mg via INTRAVENOUS
  Filled 2015-03-27: qty 1

## 2015-03-27 MED ORDER — LORAZEPAM 2 MG/ML IJ SOLN
1.0000 mg | Freq: Once | INTRAMUSCULAR | Status: AC
  Administered 2015-03-27: 1 mg via INTRAVENOUS
  Filled 2015-03-27: qty 1

## 2015-03-27 NOTE — ED Notes (Signed)
Pt requesting more ativan; reports feeling anxious. Cardizem increased per orders

## 2015-03-27 NOTE — ED Provider Notes (Signed)
CSN: [id number]     Arrival date & time 03/27/15  2058 History   First MD Initiated Contact with Patient 03/27/15 2115     Chief Complaint  Patient presents with  . Chest Pain     (Consider location/radiation/quality/duration/timing/severity/associated sxs/prior Treatment) HPI Patient presents with new dyspnea, chest pain, diaphoresis. Symptoms began earlier today, since onset has been persistent, worsening with exertion. No complete syncope, vomiting, confusion, disorientation. No fever, chills. Patient acknowledged a history of coronary disease, congestive heart failure, denies history of atrial fibrillation or other arrhythmia. He takes all medication as directed, including Lasix daily.  Past Medical History  Diagnosis Date  . Sleep apnea     on CPAP  . Coronary artery disease     2v CABG, 2004 [facility])  . MI, old 2004  . Hypertension   . Obesity   . OA (osteoarthritis)   . DJD (degenerative joint disease)   . Spinal stenosis   . Edema   . HLD (hyperlipidemia)   . Palpitations     Reported history of PVCs  . Chronic chest pain   . Chronic sinus bradycardia    Past Surgical History  Procedure Laterality Date  . Coronary artery bypass graft      2004  . Back surgery  2011  . Transthoracic echocardiogram  08/25/2010    EF 55-60%  . Right foot fracture    . Hemorrhoidectomy    . Lumbar laminectomy    . Carpal tunnel release      bilateral  . Fistula surgery     Family History  Problem Relation Age of Onset  . Breast cancer Mother   . Hypertension Mother   . Coronary artery disease Father   . Stroke Father   . Hypertension Father    History  Substance Use Topics  . Smoking status: Never Smoker   . Smokeless tobacco: Never Used  . Alcohol Use: No    Review of Systems  Constitutional:       Per HPI, otherwise negative  HENT:       Per HPI, otherwise negative  Respiratory:       Per HPI, otherwise negative  Cardiovascular:       Per HPI, otherwise  negative  Gastrointestinal: Negative for vomiting.  Endocrine:       Negative aside from HPI  Genitourinary:       Neg aside from HPI   Musculoskeletal:       Per HPI, otherwise negative  Skin: Positive for pallor.  Neurological: Positive for weakness. Negative for syncope.      Allergies  Vancomycin  Home Medications   Prior to Admission medications   Medication Sig Start Date End Date Taking? Authorizing Provider  amLODipine (NORVASC) 5 MG tablet Take 1 tablet (5 mg total) by mouth daily. 07/25/14  Yes Antoine Poche, MD  aspirin EC 81 MG tablet Take 81 mg by mouth 2 (two) times daily. 09/08/12  Yes Clide Deutscher Serpe, PA-C  atorvastatin (LIPITOR) 20 MG tablet Take 1 tablet (20 mg total) by mouth daily. 07/25/14  Yes Antoine Poche, MD  citalopram (CELEXA) 40 MG tablet Take 1 tablet by mouth every day 02/26/15  Yes Etta Grandchild, MD  Coenzyme Q10 (COQ10 PO) Take 400 mg by mouth daily.    Yes Historical Provider, MD  fish oil-omega-3 fatty acids 1000 MG capsule Take 3 g by mouth daily.     Yes Historical Provider, MD  furosemide (LASIX) 40 MG  tablet Take 1 tablet (40 mg total) by mouth as needed (Swelling). Patient taking differently: Take 40 mg by mouth daily.  07/25/14 07/25/15 Yes Antoine Poche, MD  hydrochlorothiazide (HYDRODIURIL) 25 MG tablet Take 1 tablet (25 mg total) by mouth daily. 07/25/14  Yes Antoine Poche, MD  irbesartan (AVAPRO) 300 MG tablet Take 1 tablet (300 mg total) by mouth at bedtime. 07/25/14 07/25/15 Yes Antoine Poche, MD  metoprolol succinate (TOPROL-XL) 50 MG 24 hr tablet Take 50 mg twice a day 07/25/14  Yes Antoine Poche, MD  Multiple Vitamin (MULTIVITAMIN) tablet Take 1 tablet by mouth daily.     Yes Historical Provider, MD  omeprazole (PRILOSEC) 20 MG capsule Take 20 mg by mouth 2 (two) times daily before a meal.    Yes Historical Provider, MD   BP 126/78 mmHg  Pulse 116  Temp(Src) 98.2 F (36.8 C) (Oral)  Resp 19  SpO2  96% Physical Exam  Constitutional: He is oriented to person, place, and time. He appears well-developed. No distress.  HENT:  Head: Normocephalic and atraumatic.  Eyes: Conjunctivae and EOM are normal.  Cardiovascular: An irregularly irregular rhythm present. Tachycardia present.   Pulmonary/Chest: Effort normal. No stridor. No respiratory distress.  Abdominal: He exhibits no distension.  Musculoskeletal: He exhibits no edema.  Neurological: He is alert and oriented to person, place, and time.  Skin: Skin is warm. He is diaphoretic.  Psychiatric: He has a normal mood and affect.  Nursing note and vitals reviewed.   ED Course  Procedures (including critical care time) Labs Review Labs Reviewed  BASIC METABOLIC PANEL - Abnormal; Notable for the following:    Glucose, Bld 136 (*)    Creatinine, Ser 1.34 (*)    GFR calc non Af Amer 56 (*)    All other components within normal limits  CBC  BRAIN NATRIURETIC PEPTIDE  TROPONIN I  MAGNESIUM  TSH  I-STAT TROPOININ, ED    Imaging Review Dg Chest Port 1 View  03/27/2015   CLINICAL DATA:  One day history of shortness of breath and chest pain  EXAM: PORTABLE CHEST - 1 VIEW  COMPARISON:  Chest radiograph August 23, 2010; chest CT August 23, 2010  FINDINGS: There is no edema or consolidation. Heart is upper normal in size with pulmonary vascularity within normal limits. Patient is status post coronary artery bypass grafting. No adenopathy. No pneumothorax. No appreciable bone lesions.  IMPRESSION: No edema or consolidation.   Electronically Signed   By: Bretta Bang III M.D.   On: 03/27/2015 21:49     EKG Interpretation   Date/Time:  Wednesday March 27 2015 21:06:36 EDT Ventricular Rate:  146 PR Interval:    QRS Duration: 92 QT Interval:  350 QTC Calculation: 545 R Axis:   61 Text Interpretation:  Atrial fibrillation with rapid ventricular response  Incomplete right bundle branch block ST depression, consider   subendocardial injury Abnormal ECG Atrial fibrillation with rapid  ventricular response Abnormal ekg Confirmed by Gerhard Munch  MD [phone])  on 03/27/2015 9:15:23 PM     On repeat exam the patient states that he feels better. Heart rate is now 115/120.  I discussed patient's case with our cardiology colleagues.  MDM   Final diagnoses:  Dyspnea   atrial fibrillation with rapid ventricular response This patient with history of coronary disease presents with new dyspnea, diaphoresis, weakness. Patient is found to have atrial fibrillation with rapid ventricular response. This is a new arrhythmia for the patient.  Patient improved substantially here after initiation of Cardizem drip, following initial bolus. Patient remained mildly tachycardic, both substantially diminished heart rate, improved clinical status. Patient required admission for further evaluation, management of his new arrhythmia.   CRITICAL CARE Performed by: Gerhard Munch Total critical care time: 40 Critical care time was exclusive of separately billable procedures and treating other patients. Critical care was necessary to treat or prevent imminent or life-threatening deterioration. Critical care was time spent personally by me on the following activities: development of treatment plan with patient and/or surrogate as well as nursing, discussions with consultants, evaluation of patient's response to treatment, examination of patient, obtaining history from patient or surrogate, ordering and performing treatments and interventions, ordering and review of laboratory studies, ordering and review of radiographic studies, pulse oximetry and re-evaluation of patient's condition.   Gerhard Munch, MD 03/27/15 2252

## 2015-03-27 NOTE — ED Notes (Signed)
Dr.Lockwood aware of pt's request for ativan

## 2015-03-27 NOTE — ED Notes (Signed)
Pt. reports central chest pain onset today with SOB and diaphoresis ,denies nausea or vomitting , history of CAD / CABG his cardiologist is Dr. Marylee Floras .

## 2015-03-28 ENCOUNTER — Observation Stay (HOSPITAL_COMMUNITY): Payer: PRIVATE HEALTH INSURANCE

## 2015-03-28 DIAGNOSIS — I480 Paroxysmal atrial fibrillation: Secondary | ICD-10-CM | POA: Diagnosis not present

## 2015-03-28 DIAGNOSIS — I4891 Unspecified atrial fibrillation: Secondary | ICD-10-CM

## 2015-03-28 DIAGNOSIS — R079 Chest pain, unspecified: Secondary | ICD-10-CM | POA: Diagnosis not present

## 2015-03-28 LAB — BASIC METABOLIC PANEL
ANION GAP: 11 (ref 5–15)
BUN: 18 mg/dL (ref 6–20)
CO2: 25 mmol/L (ref 22–32)
Calcium: 9.3 mg/dL (ref 8.9–10.3)
Chloride: 101 mmol/L (ref 101–111)
Creatinine, Ser: 0.98 mg/dL (ref 0.61–1.24)
GFR calc Af Amer: >60 mL/min (ref >60)
Glucose, Bld: 110 mg/dL — ABNORMAL HIGH (ref 65–99)
Potassium: 3.4 mmol/L — ABNORMAL LOW (ref 3.5–5.1)
SODIUM: 137 mmol/L (ref 135–145)

## 2015-03-28 LAB — MRSA PCR SCREENING: MRSA BY PCR: NEGATIVE

## 2015-03-28 LAB — TROPONIN I: TROPONIN I: 0.06 ng/mL — AB (ref <0.031)

## 2015-03-28 LAB — MAGNESIUM: MAGNESIUM: 2.3 mg/dL (ref 1.7–2.4)

## 2015-03-28 MED ORDER — DILTIAZEM HCL ER COATED BEADS 240 MG PO CP24
240.0000 mg | ORAL_CAPSULE | Freq: Every day | ORAL | Status: DC
  Administered 2015-03-28: 240 mg via ORAL
  Filled 2015-03-28: qty 1

## 2015-03-28 MED ORDER — MAGNESIUM SULFATE 2 GM/50ML IV SOLN
2.0000 g | Freq: Once | INTRAVENOUS | Status: AC
  Administered 2015-03-28: 2 g via INTRAVENOUS
  Filled 2015-03-28: qty 50

## 2015-03-28 MED ORDER — REGADENOSON 0.4 MG/5ML IV SOLN
INTRAVENOUS | Status: AC
  Administered 2015-03-28: 0.4 mg via INTRAVENOUS
  Filled 2015-03-28: qty 5

## 2015-03-28 MED ORDER — PANTOPRAZOLE SODIUM 40 MG PO TBEC
40.0000 mg | DELAYED_RELEASE_TABLET | Freq: Every day | ORAL | Status: DC
  Filled 2015-03-28: qty 1

## 2015-03-28 MED ORDER — POTASSIUM CHLORIDE ER 10 MEQ PO TBCR
40.0000 meq | EXTENDED_RELEASE_TABLET | Freq: Once | ORAL | Status: AC
  Administered 2015-03-28: 40 meq via ORAL
  Filled 2015-03-28: qty 4

## 2015-03-28 MED ORDER — ONDANSETRON HCL 4 MG/2ML IJ SOLN: 4.0000 mg | Freq: Four times a day (QID) | INTRAMUSCULAR | Status: DC | PRN

## 2015-03-28 MED ORDER — REGADENOSON 0.4 MG/5ML IV SOLN
0.4000 mg | Freq: Once | INTRAVENOUS | Status: AC
Start: 2015-03-28 — End: 2015-03-28
  Administered 2015-03-28: 0.4 mg via INTRAVENOUS
  Filled 2015-03-28: qty 5

## 2015-03-28 MED ORDER — APIXABAN 5 MG PO TABS: 5.0000 mg | ORAL_TABLET | Freq: Two times a day (BID) | ORAL | Status: DC

## 2015-03-28 MED ORDER — IRBESARTAN 300 MG PO TABS
300.0000 mg | ORAL_TABLET | Freq: Every day | ORAL | Status: DC
  Filled 2015-03-28: qty 1

## 2015-03-28 MED ORDER — ASPIRIN EC 81 MG PO TBEC
81.0000 mg | DELAYED_RELEASE_TABLET | Freq: Every day | ORAL | Status: DC
  Filled 2015-03-28: qty 1

## 2015-03-28 MED ORDER — NITROGLYCERIN 0.4 MG SL SUBL: 0.4000 mg | SUBLINGUAL_TABLET | SUBLINGUAL | Status: DC | PRN

## 2015-03-28 MED ORDER — APIXABAN 5 MG PO TABS
5.0000 mg | ORAL_TABLET | Freq: Two times a day (BID) | ORAL | Status: DC
  Administered 2015-03-28 (×2): 5 mg via ORAL
  Filled 2015-03-28 (×3): qty 1

## 2015-03-28 MED ORDER — METOPROLOL SUCCINATE ER 50 MG PO TB24
50.0000 mg | ORAL_TABLET | Freq: Two times a day (BID) | ORAL | Status: DC
  Administered 2015-03-28: 50 mg via ORAL
  Filled 2015-03-28 (×2): qty 1

## 2015-03-28 MED ORDER — DILTIAZEM HCL ER COATED BEADS 240 MG PO CP24: 240.0000 mg | ORAL_CAPSULE | Freq: Every day | ORAL | Status: DC

## 2015-03-28 MED ORDER — CITALOPRAM HYDROBROMIDE 40 MG PO TABS
40.0000 mg | ORAL_TABLET | Freq: Every day | ORAL | Status: DC
  Filled 2015-03-28: qty 1

## 2015-03-28 MED ORDER — ACETAMINOPHEN 325 MG PO TABS
650.0000 mg | ORAL_TABLET | ORAL | Status: DC | PRN
Start: 2015-03-28 — End: 2015-03-28

## 2015-03-28 MED ORDER — ATORVASTATIN CALCIUM 20 MG PO TABS
20.0000 mg | ORAL_TABLET | Freq: Every day | ORAL | Status: DC
  Filled 2015-03-28: qty 1

## 2015-03-28 MED ORDER — TECHNETIUM TC 99M SESTAMIBI GENERIC - CARDIOLITE
30.0000 | Freq: Once | INTRAVENOUS | Status: AC | PRN
  Administered 2015-03-28: 30 via INTRAVENOUS

## 2015-03-28 MED ORDER — ALPRAZOLAM 0.5 MG PO TABS
1.0000 mg | ORAL_TABLET | Freq: Once | ORAL | Status: AC
  Administered 2015-03-28: 1 mg via ORAL
  Filled 2015-03-28: qty 2

## 2015-03-28 MED ORDER — TECHNETIUM TC 99M SESTAMIBI GENERIC - CARDIOLITE
10.0000 | Freq: Once | INTRAVENOUS | Status: AC | PRN
  Administered 2015-03-28: 10 via INTRAVENOUS

## 2015-03-28 NOTE — Discharge Summary (Signed)
Discharge Summary   Patient ID: [name] [name],  MRN: [id number], DOB/AGE: 1951/06/23 64 y.o.  Admit date: 03/27/2015 Discharge date: 03/28/2015  Primary Care Provider: Sanda Linger Primary Cardiologist: [name]  Discharge Diagnoses Principal Problem:   Atrial fibrillation with RVR Active Problems:   Hyperlipidemia with target LDL less than 70   Obstructive sleep apnea   Essential hypertension   Coronary atherosclerosis   Allergies Allergies  Allergen Reactions  . Vancomycin Other (See Comments)    Was informed after heart surgery ? Breathing problem/patient unsure    Procedures  Echocardiogram 03/28/2015 LV EF: 50% -  55%  ------------------------------------------------------------------- Indications:   Atrial fibrillation - 427.31.  ------------------------------------------------------------------- History:  Risk factors: Obstructive sleep apnea. Hypertension. Dyslipidemia.  ------------------------------------------------------------------- Study Conclusions  - Left ventricle: The cavity size was normal. Wall thickness was increased in a pattern of mild LVH. Systolic function was normal. The estimated ejection fraction was in the range of 50% to 55%. Wall motion was normal; there were no regional wall motion abnormalities. Doppler parameters are consistent with high ventricular filling pressure. - Left atrium: The atrium was mildly dilated.    Myoview 03/28/2015  Study Result      No T wave inversion was noted during stress.  There was no ST segment deviation noted during stress.  The study is normal.  This is a low risk study.  The left ventricular ejection fraction is normal (55-65%).  Nuclear stress EF: 61%.  Normal Lexiscan Myoview study. No evidence of ischemia. Normal LV function with EF of 61%.       Hospital Course  The patient is a 64 yo male with PMH of CAD s/p CABG 2014, OSA, obesity, HTN, and HLD who  presented to [facility] on 03/27/2015 with chest discomfort and palpitation. He was noted to be in new onset of a-fib with RVR on arrival and was placed on IV diltiazem, his HCTZ and amlodipine were held. He was also placed on eliquis given CHA2DS2-VASC score of 2 (HTN, CAD).   He spontaneously converted around 3 AM in the morning of 03/28/2015 on IV diltiazem. He was transitioned to PO diltiazem. His serial troponin overnight has been borderline at 0.062. Given the mildly elevated troponin, new onset of atrial fibrillation, and recent chest discomfort, but decision was made to have the patient undergo Lexiscan stress test to rule out possible CAD which may be responsible for the new onset of atrial fibrillation. Myoview showed EF 61%, no evidence of ischemia, overall low risk study. Echocardiogram was also obtained on 03/28/2015 which showed EF 50-55%, no RWMA.  Patient is deemed stable for discharge from cardiology perspective. I have arranged 2-4 weeks follow-up in our [facility] office with [name]. I have discussed with the nursing staff who will contact case manager given the fact the patient was newly placed on Eliquis. I have given him 2 paper Rx for eliquis, one is 60 tablet without refill to use with coupon, and 2nd one is 60 tablet with 11 refills.   Discharge Vitals Blood pressure 124/63, pulse 92, temperature 97 F (36.1 C), temperature source Oral, resp. rate 21, height  (1.676 m), weight 251 lb 12.3 oz (114.2 kg), SpO2 98 %.  Filed Weights   03/28/15 0324  Weight: 251 lb 12.3 oz (114.2 kg)    Labs  CBC  Recent Labs  03/27/15 2111  WBC 10.1  HGB 16.1  HCT 45.8  MCV 82.8  PLT 317   Basic Metabolic Panel  Recent Labs  03/27/15 2111 03/27/15 2211 03/28/15 0517  NA 139  --  137  K 3.7  --  3.4*  CL 102  --  101  CO2 24  --  25  GLUCOSE 136*  --  110*  BUN 18  --  18  CREATININE 1.34*  --  0.98  CALCIUM 10.3  --  9.3  MG  --  1.8 2.3   Cardiac Enzymes  Recent Labs   03/27/15 2211 03/28/15 0517  TROPONINI 0.06* 0.06*   Thyroid Function Tests  Recent Labs  03/27/15 2210  TSH 4.606*    Disposition  Pt is being discharged home today in good condition.  Follow-up Plans & Appointments      Follow-up Information    Follow up with Dina Rich, MD On 04/10/2015.   Specialty:  Cardiology   Why:  2:00pm   Contact information:   [address] [place] [address] [phone]       Discharge Medications    Medication List    STOP taking these medications        amLODipine 5 MG tablet  Commonly known as:  NORVASC     aspirin EC 81 MG tablet     hydrochlorothiazide 25 MG tablet  Commonly known as:  HYDRODIURIL      TAKE these medications        apixaban 5 MG Tabs tablet  Commonly known as:  ELIQUIS  Take 1 tablet (5 mg total) by mouth 2 (two) times daily.     atorvastatin 20 MG tablet  Commonly known as:  LIPITOR  Take 1 tablet (20 mg total) by mouth daily.     citalopram 40 MG tablet  Commonly known as:  CELEXA  Take 1 tablet by mouth every day     COQ10 PO  Take 400 mg by mouth daily.     diltiazem 240 MG 24 hr capsule  Commonly known as:  CARDIZEM CD  Take 1 capsule (240 mg total) by mouth daily.     fish oil-omega-3 fatty acids 1000 MG capsule  Take 3 g by mouth daily.     furosemide 40 MG tablet  Commonly known as:  LASIX  Take 1 tablet (40 mg total) by mouth as needed (Swelling).     irbesartan 300 MG tablet  Commonly known as:  AVAPRO  Take 1 tablet (300 mg total) by mouth at bedtime.     metoprolol succinate 50 MG 24 hr tablet  Commonly known as:  TOPROL-XL  Take 50 mg twice a day     multivitamin tablet  Take 1 tablet by mouth daily.     omeprazole 20 MG capsule  Commonly known as:  PRILOSEC  Take 20 mg by mouth 2 (two) times daily before a meal.        Duration of Discharge Encounter   Greater than 30 minutes including physician time.  Ramond Dial PA-C Pager:  [phone] 03/28/2015, 3:54 PM

## 2015-03-28 NOTE — Progress Notes (Signed)
ANTICOAGULATION CONSULT NOTE - Initial Consult  Pharmacy Consult for Eliquis Indication: atrial fibrillation  Allergies  Allergen Reactions  . Vancomycin Other (See Comments)    Was informed after heart surgery ? Breathing problem/patient unsure     Vital Signs: Temp: 98.2 F (36.8 C) (06/22 2116) Temp Source: Oral (06/22 2116) BP: 124/73 mmHg (06/23 0130) Pulse Rate: 91 (06/23 0115)  Labs:  Recent Labs  03/27/15 2111 03/27/15 2211  HGB 16.1  --   HCT 45.8  --   PLT 317  --   CREATININE 1.34*  --   TROPONINI  --  0.06*     Medical History: Past Medical History  Diagnosis Date  . Sleep apnea     on CPAP  . Coronary artery disease     2v CABG, 2004 [facility])  . MI, old 2004  . Hypertension   . Obesity   . OA (osteoarthritis)   . DJD (degenerative joint disease)   . Spinal stenosis   . Edema   . HLD (hyperlipidemia)   . Palpitations     Reported history of PVCs  . Chronic chest pain   . Chronic sinus bradycardia     Medications:  Prescriptions prior to admission  Medication Sig Dispense Refill Last Dose  . amLODipine (NORVASC) 5 MG tablet Take 1 tablet (5 mg total) by mouth daily. 30 tablet 11 03/27/2015 at Unknown time  . aspirin EC 81 MG tablet Take 81 mg by mouth 2 (two) times daily.   03/27/2015 at Unknown time  . atorvastatin (LIPITOR) 20 MG tablet Take 1 tablet (20 mg total) by mouth daily. 30 tablet 11 03/27/2015 at Unknown time  . citalopram (CELEXA) 40 MG tablet Take 1 tablet by mouth every day 90 tablet 0 03/27/2015 at Unknown time  . Coenzyme Q10 (COQ10 PO) Take 400 mg by mouth daily.    03/27/2015 at Unknown time  . fish oil-omega-3 fatty acids 1000 MG capsule Take 3 g by mouth daily.     03/27/2015 at Unknown time  . furosemide (LASIX) 40 MG tablet Take 1 tablet (40 mg total) by mouth as needed (Swelling). (Patient taking differently: Take 40 mg by mouth daily. ) 30 tablet 11 03/27/2015 at Unknown time  . hydrochlorothiazide (HYDRODIURIL) 25 MG tablet  Take 1 tablet (25 mg total) by mouth daily. 30 tablet 11 03/27/2015 at Unknown time  . irbesartan (AVAPRO) 300 MG tablet Take 1 tablet (300 mg total) by mouth at bedtime. 30 tablet 11 03/27/2015 at Unknown time  . metoprolol succinate (TOPROL-XL) 50 MG 24 hr tablet Take 50 mg twice a day 180 tablet 4 03/27/2015 at Unknown time  . Multiple Vitamin (MULTIVITAMIN) tablet Take 1 tablet by mouth daily.     03/27/2015 at Unknown time  . omeprazole (PRILOSEC) 20 MG capsule Take 20 mg by mouth 2 (two) times daily before a meal.    03/27/2015 at Unknown time   Scheduled:  . aspirin EC  81 mg Oral Daily  . atorvastatin  20 mg Oral Daily  . citalopram  40 mg Oral Daily  . irbesartan  300 mg Oral QHS  . magnesium sulfate 1 - 4 g bolus IVPB  2 g Intravenous Once  . metoprolol succinate  50 mg Oral BID  . pantoprazole  40 mg Oral Daily  . potassium chloride  40 mEq Oral Once   Infusions:  . diltiazem (CARDIZEM) infusion 10 mg/hr (03/28/15 0017)    Assessment: 64yo male c/o CP associated w/  SOB and diaphoresis, found to be in Afib w/ RVR, to begin oral anticoag w/ DOAC.    Plan:  Will start Eliquis  PO BID, starting now per MD request, and begin anticoag education.  Vernard Gambles, PharmD, BCPS  03/28/2015,2:40 AM

## 2015-03-28 NOTE — H&P (Addendum)
PCP:   Sanda Linger, MD   Primary cardiologist: Dr. Dina Rich  Chief Complaint:  Palpitations, heart tracings, chest pain  HPI: This 64 year old male with past medical history of coronary artery disease, history of MI, status post CABG in 2014, sleep apnea, obesity, hypertension, hyperlipidemia who presented with palpitations, heart tracings and chest discomfort index symptoms started in the afternoon on 03/28/2015. However there are notes in Epic that patient called on 03/25/2015 complaining of chest pain and palpitations. Patient usually works out with weightlifting as well as stationary bike cycling 3 times a week. He had his usual work out earlier on day of presentation when he notices that his heart rate was never goes above 80 bpm was around 115 bpm. He didn't have any shortness of breath or chest pain associate with his workout. However later in the day history of feeling palpitations, sensation is diffuse heart is pounding as well as pressure type chest pain. In the emergency department patient was started on Cardizem drip, was given 60 mg of Lasix then ordered for 500 mL of normal saline. At the time of my evaluation patient's heart rate was somewhat better controlled and low 100s with occasional elevation up to 130s. He was complaining of minimal chest discomfort. History of reporting I was noted to be 0.06 which is slightly elevated.  Patient denied active symptoms  shortness of breath, PND orthopnea, lower extremity edema, lower extremity edema, nausea vomiting, dysuria, diarrhea.  Review of Systems:  12 systems were reviewed and were negative except mentioned in history of present illness  Past Medical History: Past Medical History  Diagnosis Date  . Sleep apnea     on CPAP  . Coronary artery disease     2v CABG, 2004 [facility])  . MI, old 2004  . Hypertension   . Obesity   . OA (osteoarthritis)   . DJD (degenerative joint disease)   . Spinal stenosis   . Edema   .  HLD (hyperlipidemia)   . Palpitations     Reported history of PVCs  . Chronic chest pain   . Chronic sinus bradycardia    Past Surgical History  Procedure Laterality Date  . Coronary artery bypass graft      2004  . Back surgery  2011  . Transthoracic echocardiogram  08/25/2010    EF 55-60%  . Right foot fracture    . Hemorrhoidectomy    . Lumbar laminectomy    . Carpal tunnel release      bilateral  . Fistula surgery      Medications: Prior to Admission medications   Medication Sig Start Date End Date Taking? Authorizing Provider  amLODipine (NORVASC) 5 MG tablet Take 1 tablet (5 mg total) by mouth daily. 07/25/14  Yes Antoine Poche, MD  aspirin EC 81 MG tablet Take 81 mg by mouth 2 (two) times daily. 09/08/12  Yes Clide Deutscher Serpe, PA-C  atorvastatin (LIPITOR) 20 MG tablet Take 1 tablet (20 mg total) by mouth daily. 07/25/14  Yes Antoine Poche, MD  citalopram (CELEXA) 40 MG tablet Take 1 tablet by mouth every day 02/26/15  Yes Etta Grandchild, MD  Coenzyme Q10 (COQ10 PO) Take 400 mg by mouth daily.    Yes Historical Provider, MD  fish oil-omega-3 fatty acids 1000 MG capsule Take 3 g by mouth daily.     Yes Historical Provider, MD  furosemide (LASIX) 40 MG tablet Take 1 tablet (40 mg total) by mouth as needed (Swelling).  Patient taking differently: Take 40 mg by mouth daily.  07/25/14 07/25/15 Yes Antoine Poche, MD  hydrochlorothiazide (HYDRODIURIL) 25 MG tablet Take 1 tablet (25 mg total) by mouth daily. 07/25/14  Yes Antoine Poche, MD  irbesartan (AVAPRO) 300 MG tablet Take 1 tablet (300 mg total) by mouth at bedtime. 07/25/14 07/25/15 Yes Antoine Poche, MD  metoprolol succinate (TOPROL-XL) 50 MG 24 hr tablet Take 50 mg twice a day 07/25/14  Yes Antoine Poche, MD  Multiple Vitamin (MULTIVITAMIN) tablet Take 1 tablet by mouth daily.     Yes Historical Provider, MD  omeprazole (PRILOSEC) 20 MG capsule Take 20 mg by mouth 2 (two) times daily before a meal.     Yes Historical Provider, MD    Allergies:   Allergies  Allergen Reactions  . Vancomycin Other (See Comments)    Was informed after heart surgery ? Breathing problem/patient unsure    Social History:  reports that he has never smoked. He has never used smokeless tobacco. He reports that he does not drink alcohol or use illicit drugs.  Family History: Family History  Problem Relation Age of Onset  . Breast cancer Mother   . Hypertension Mother   . Coronary artery disease Father   . Stroke Father   . Hypertension Father     PHYSICAL EXAM:  Filed Vitals:   03/27/15 2315 03/27/15 2330 03/27/15 2345 03/28/15 0000  BP: 134/80 123/78 135/89 122/78  Pulse: 133 130 126 107  Temp:      TempSrc:      Resp: 23 16 19 22   SpO2: 97% 96% 96% 94%   General:  Well appearing. No respiratory difficulty HEENT: normal Neck: supple. no JVD. Carotids 2+ bilat; no bruits. No lymphadenopathy or thryomegaly appreciated. Cor: PMI nondisplaced. irregularly irregular rate and rhythm No rubs, gallops or murmurs. Lungs: clear Abdomen: soft, nontender, nondistended. No hepatosplenomegaly. No bruits or masses. Good bowel sounds. Extremities: no cyanosis, clubbing, rash, edema Neuro: alert & oriented x 3, cranial nerves grossly intact. moves all 4 extremities w/o difficulty. Affect pleasant.  Labs on Admission:   Recent Labs  03/27/15 2111 03/27/15 2211  NA 139  --   K 3.7  --   CL 102  --   CO2 24  --   GLUCOSE 136*  --   BUN 18  --   CREATININE 1.34*  --   CALCIUM 10.3  --   MG  --  1.8   No results for input(s): AST, ALT, ALKPHOS, BILITOT, PROT, ALBUMIN in the last 72 hours. No results for input(s): LIPASE, AMYLASE in the last 72 hours.  Recent Labs  03/27/15 2111  WBC 10.1  HGB 16.1  HCT 45.8  MCV 82.8  PLT 317    Recent Labs  03/27/15 2211  TROPONINI 0.06*    Recent Labs  03/27/15 2210  TSH 4.606*   No results for input(s): VITAMINB12, FOLATE, FERRITIN, TIBC,  IRON, RETICCTPCT in the last 72 hours.  Radiological Exams on Admission (all images were personally reviewed and interpreted by me, radiology reports were reviewed as well): Dg Chest Port 1 View  03/27/2015   CLINICAL DATA:  One day history of shortness of breath and chest pain  EXAM: PORTABLE CHEST - 1 VIEW  COMPARISON:  Chest radiograph August 23, 2010; chest CT August 23, 2010  FINDINGS: There is no edema or consolidation. Heart is upper normal in size with pulmonary vascularity within normal limits. Patient is status post coronary  artery bypass grafting. No adenopathy. No pneumothorax. No appreciable bone lesions.  IMPRESSION: No edema or consolidation.   Electronically Signed   By: Bretta Bang III M.D.   On: 03/27/2015 21:49    EKG personally reviewed and interpreted by me: A Fib with RVR, incomplete right bundle branch block, slight ST depression suggestive of subendocardial ischemia  Assessment/Plan Present on Admission:  . Atrial fibrillation with RVR # Chest pain #Coronary artery disease status post CABG 2 #Hyperlipidemia #Hypertension  Patient presented with A. fib RVR. He appears to be slightly dehydrated as evidenced by a slightly elevated creatinine as well as hemoconcentration. His potassium was 3.7 and his magnesium was 1.8. However dehydrational electrolyte abnormalities are likely only contributed to his presentation. He has mild troponin elevation as well as chest pain associate with atrial fibrillation. I think his troponin elevation chest pain is likely demand ischemia however patient has known coronary artery disease and hasn't had any recent ischemic evaluation. It will be reasonable to proceed with noninvasive ischemic evaluation after he is out of atrial fibrillation.  His heart rate doesn't appear to be well controlled on 10 mg of diltiazem drip. I think given the first time atrial fibrillation he will benefit from TEE/guided cardioversion.  Plan: - TEE guided  cardioversion in a.m., nothing by mouth after midnight (TEE was not ordered) - Echocardiogram - Supplement potassium and magnesium - 1 L of Water by mouth on admission to the floor, patient was reluctant to get IV fluids - Continue Cardizem drip, hold amlodipine - Continue metoprolol, Lipitor, ARB for his hypertension and hyperlipidemia as well as coronary artery disease - Decrease aspirin to 81 mg daily - apixaban 5 mg twice a day, CHADS2-VAS score is 2 (HTN, CAD)  Asti Mackley 03/28/2015, 1:07 AM

## 2015-03-28 NOTE — Progress Notes (Signed)
  Echocardiogram 2D Echocardiogram has been performed.  [name] [name] 03/28/2015, 3:09 PM

## 2015-03-28 NOTE — Progress Notes (Signed)
Patient Name: [name] [name] Date of Encounter: 03/28/2015  Primary Cardiologist: [name]   Principal Problem:   Atrial fibrillation with RVR Active Problems:   Hyperlipidemia with target LDL less than 70   Obstructive sleep apnea   Essential hypertension   Coronary atherosclerosis    SUBJECTIVE  Denies any CP or SOB this morning. Has been having palpitation for yrs  CURRENT MEDS . apixaban  5 mg Oral BID  . aspirin EC  81 mg Oral Daily  . atorvastatin  20 mg Oral Daily  . citalopram  40 mg Oral Daily  . irbesartan  300 mg Oral QHS  . metoprolol succinate  50 mg Oral BID  . pantoprazole  40 mg Oral Daily    OBJECTIVE  Filed Vitals:   03/28/15 0115 03/28/15 0130 03/28/15 0225 03/28/15 0324  BP: 130/71 124/73 134/80 144/83  Pulse: 91  83 92  Temp:   98.4 F (36.9 C) 97.3 F (36.3 C)  TempSrc:   Oral Axillary  Resp: 19 23 21 21   Height:    5\' 6"  (1.676 m)  Weight:    251 lb 12.3 oz (114.2 kg)  SpO2: 95% 97% 95% 98%    Intake/Output Summary (Last 24 hours) at 03/28/15 0843 Last data filed at 03/28/15 0600  Gross per 24 hour  Intake   1120 ml  Output    675 ml  Net    445 ml   Filed Weights   03/28/15 0324  Weight: 251 lb 12.3 oz (114.2 kg)    PHYSICAL EXAM  General: Pleasant, NAD. Neuro: Alert and oriented X 3. Moves all extremities spontaneously. Psych: Normal affect. HEENT:  Normal  Neck: Supple without bruits or JVD. Lungs:  Resp regular and unlabored, CTA. Heart: RRR no s3, s4, or murmurs. Abdomen: Soft, non-tender, non-distended, BS + x 4.  Extremities: No clubbing, cyanosis or edema. DP/PT/Radials 2+ and equal bilaterally.  Accessory Clinical Findings  CBC  Recent Labs  03/27/15 2111  WBC 10.1  HGB 16.1  HCT 45.8  MCV 82.8  PLT 317   Basic Metabolic Panel  Recent Labs  03/27/15 2111 03/27/15 2211 03/28/15 0517  NA 139  --  137  K 3.7  --  3.4*  CL 102  --  101  CO2 24  --  25  GLUCOSE 136*  --  110*  BUN 18  --  18    CREATININE 1.34*  --  0.98  CALCIUM 10.3  --  9.3  MG  --  1.8 2.3   Cardiac Enzymes  Recent Labs  03/27/15 2211 03/28/15 0517  TROPONINI 0.06* 0.06*   Thyroid Function Tests  Recent Labs  03/27/15 2210  TSH 4.606*    TELE Converted to NSR around 2:53 am    ECG  No new EKG  Echocardiogram  pending    Radiology/Studies  Dg Chest Port 1 View  03/27/2015   CLINICAL DATA:  One day history of shortness of breath and chest pain  EXAM: PORTABLE CHEST - 1 VIEW  COMPARISON:  Chest radiograph August 23, 2010; chest CT August 23, 2010  FINDINGS: There is no edema or consolidation. Heart is upper normal in size with pulmonary vascularity within normal limits. Patient is status post coronary artery bypass grafting. No adenopathy. No pneumothorax. No appreciable bone lesions.  IMPRESSION: No edema or consolidation.   Electronically Signed   By: Bretta Bang III M.D.   On: 03/27/2015 21:49    ASSESSMENT AND  PLAN  1. PAF with RVR - converted overnight on IV diltiazem  - per pt has palpitation for yrs  - CHADS-VASC score 2 (HTN, CAD)  - continue metoprolol  BID, will transition IV diltiazem (currently at 10) to  long acting diltiazem  - started on eliquis  - pending echocardiogram, ideally will need outpatient myoview at some point as he has intermittent CP, pt live in [place] problematic, will discuss with MD  - likely discharge today  2. Elevated trop: flat trend, ?related to RVR  3. [place] high TSH: need to followup with PCP  4. Intermittent CP: unclear if related to PAF, states different compare to angina prior to CABG as sx very light  5. CAD s/p CABG 2014  6. OSA 7. Obesity 8. HTN 9. HLD  Signed, Azalee Course PA-C Pager: [phone]  History and all data above reviewed.  Patient examined.  I agree with the findings as above.  He is no longer having pain.  No SOB.  Converted spontaneously to NSR.  The patient exam reveals COR:RRR  ,  Lungs:  Clear  ,  Abd: Positive bowel sounds, no rebound no guarding, Ext No edema  .  All available labs, radiology testing, previous records reviewed. Agree with documented assessment and plan. Atrial fib:  Started on Eliquis.  Discuss risk benefits with the patient.  No ASA.  Added Cardizem and stopped the Norvasc.  Chest pain:  Elevated troponin is likely demand ischemia.  Lexiscan Myoview and echo today.  If negative the patient can go home.   Rollene Rotunda  8:59 AM  03/28/2015

## 2015-03-28 NOTE — Progress Notes (Signed)
CM called to speak with pt regarding Eliquis. CM provided pt with Eliquis pocket with 30 day free card. Pt verbally stated understanding usage of card. Chales Abrahams in [place] @ [phone] called per CM and verified medication is in stock, pt made aware. Benefit check in process, CM will make pt aware if results are available prior to d/c.  Gae Gallop RN,BSN, [place] [phone]

## 2015-03-28 NOTE — Discharge Instructions (Signed)
Information on my medicine - ELIQUIS (apixaban)  This medication education was reviewed with me or my healthcare representative as part of my discharge preparation.  The pharmacist that spoke with me during my hospital stay was:  Lennon Alstrom, [facility]  Why was Eliquis prescribed for you? Eliquis was prescribed for you to reduce the risk of a blood clot forming that can cause a stroke if you have a medical condition called atrial fibrillation (a type of irregular heartbeat).  What do You need to know about Eliquis ? Take your Eliquis TWICE DAILY - one tablet in the morning and one tablet in the evening with or without food. If you have difficulty swallowing the tablet whole please discuss with your pharmacist how to take the medication safely.  Take Eliquis exactly as prescribed by your doctor and DO NOT stop taking Eliquis without talking to the doctor who prescribed the medication.  Stopping may increase your risk of developing a stroke.  Refill your prescription before you run out.  After discharge, you should have regular check-up appointments with your healthcare provider that is prescribing your Eliquis.  In the future your dose may need to be changed if your kidney function or weight changes by a significant amount or as you get older.  What do you do if you miss a dose? If you miss a dose, take it as soon as you remember on the same day and resume taking twice daily.  Do not take more than one dose of ELIQUIS at the same time to make up a missed dose.  Important Safety Information A possible side effect of Eliquis is bleeding. You should call your healthcare provider right away if you experience any of the following: ? Bleeding from an injury or your nose that does not stop. ? Unusual colored urine (red or dark brown) or unusual colored stools (red or black). ? Unusual bruising for unknown reasons. ? A serious fall or if you hit your head (even if there is no bleeding).  Some  medicines may interact with Eliquis and might increase your risk of bleeding or clotting while on Eliquis. To help avoid this, consult your healthcare provider or pharmacist prior to using any new prescription or non-prescription medications, including herbals, vitamins, non-steroidal anti-inflammatory drugs (NSAIDs) and supplements.  This website has more information on Eliquis (apixaban): http://www.eliquis.com/eliquis/home  Atrial Fibrillation Atrial fibrillation is a condition that causes your heart to beat irregularly. It may also cause your heart to beat faster than normal. Atrial fibrillation can prevent your heart from pumping blood normally. It increases your risk of stroke and heart problems. HOME CARE  Take medications as told by your doctor.  Only take medications that your doctor says are safe. Some medications can make the condition worse or happen again.  If blood thinners were prescribed by your doctor, take them exactly as told. Too much can cause bleeding. Too little and you will not have the needed protection against stroke and other problems.  Perform blood tests at home if told by your doctor.  Perform blood tests exactly as told by your doctor.  Do not drink alcohol.  Do not drink beverages with caffeine such as coffee, soda, and some teas.  Maintain a healthy weight.  Do not use diet pills unless your doctor says they are safe. They may make heart problems worse.  Follow diet instructions as told by your doctor.  Exercise regularly as told by your doctor.  Keep all follow-up appointments. GET  HELP IF:  You notice a change in the speed, rhythm, or strength of your heartbeat.  You suddenly begin peeing (urinating) more often.  You get tired more easily when moving or exercising. GET HELP RIGHT AWAY IF:   You have chest or belly (abdominal) pain.  You feel sick to your stomach (nauseous).  You are short of breath.  You suddenly have swollen feet  and ankles.  You feel dizzy.  You face, arms, or legs feel numb or weak.  There is a change in your vision or speech. MAKE SURE YOU:   Understand these instructions.  Will watch your condition.  Will get help right away if you are not doing well or get worse. Document Released: 06/30/2008 Document Revised: 02/05/2014 Document Reviewed: 11/01/2012 [facility] Patient Information 2015 [place], [place]. This information is not intended to replace advice given to you by your health care provider. Make sure you discuss any questions you have with your health care provider.

## 2015-03-28 NOTE — ED Notes (Signed)
Provider bedside.

## 2015-03-28 NOTE — Progress Notes (Addendum)
Pt to d/c home, paged R. Barrett, PA at req of [place], [place] to inform ECHO resulted.

## 2015-03-29 ENCOUNTER — Telehealth: Payer: Self-pay | Admitting: Cardiology

## 2015-03-29 LAB — HEMOGLOBIN A1C
Hgb A1c MFr Bld: 6.2 % — ABNORMAL HIGH (ref 4.8–5.6)
Mean Plasma Glucose: 131 mg/dL

## 2015-03-29 NOTE — Telephone Encounter (Signed)
Pt has been placed on Eliquis per discharge summary 03/27/15 and questioned safety with taking this with arthritis pain medication, was unclear if pain medicine was NSAID or contained ASA. Pt will call back with specifics when he gets home.

## 2015-03-29 NOTE — Telephone Encounter (Signed)
Pt verified tylenol not NSAID was in arthritis medication. Pt told OK to take with Eliquis and to avoid ASA and NSAIDS, pt agreeable.

## 2015-03-29 NOTE — Telephone Encounter (Signed)
Please call patient in regards to his HCTZ, he was discharged from [facility] on 03/28/15

## 2015-04-10 ENCOUNTER — Encounter: Payer: Self-pay | Admitting: Cardiology

## 2015-04-10 ENCOUNTER — Ambulatory Visit (INDEPENDENT_AMBULATORY_CARE_PROVIDER_SITE_OTHER): Payer: PRIVATE HEALTH INSURANCE | Admitting: Cardiology

## 2015-04-10 VITALS — BP 138/70 | HR 48 | Ht 66.0 in | Wt 255.0 lb

## 2015-04-10 DIAGNOSIS — I480 Paroxysmal atrial fibrillation: Secondary | ICD-10-CM

## 2015-04-10 DIAGNOSIS — E785 Hyperlipidemia, unspecified: Secondary | ICD-10-CM

## 2015-04-10 DIAGNOSIS — I4891 Unspecified atrial fibrillation: Secondary | ICD-10-CM

## 2015-04-10 DIAGNOSIS — I2510 Atherosclerotic heart disease of native coronary artery without angina pectoris: Secondary | ICD-10-CM

## 2015-04-10 NOTE — Patient Instructions (Signed)
Your physician has requested that you regularly monitor and record your blood pressure readings at home. Please take your readings approximately 2 hours after medication daily for the next 2 weeks.  Bring readings back to office for MD review.  Continue all current medications. Follow up in  3 months

## 2015-04-10 NOTE — Progress Notes (Signed)
Clinical Summary Mr. Stefko is a 64 y.o.male seen today for follow up of the following medical problems.   1. CAD  - prior 2 vessel CABG in 2004 at [facility]  - apparently had follow up cath in 2006 from [place] with patent grafts. Reports negative stress test in 2011 by Dr Hyacinth Meeker in [place].  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 No significant SOB or DOE - compliant with meds  2. HTN  - checks bp occasionally, typically around 130-150s/80s  - compliant with meds   3. Hyperlipidemia  - noted some side effects on higher lipitor doses, has tolerated lower dose well - no recent lipid panel in our system  4. OSA  - compliant with CPAP  5. Afib - new diagnosis during 03/2015 admission. - notes some occasional palpitations at times, though fairly infrequently  - Started on eliquis 5mg  bid during recent admission, denies any bleeding issues.  Past Medical History  Diagnosis Date  . Sleep apnea     on CPAP  . Coronary artery disease     2v CABG, 2004 [facility])  . MI, old 2004  . Hypertension   . Obesity   . OA (osteoarthritis)   . DJD (degenerative joint disease)   . Spinal stenosis   . Edema   . HLD (hyperlipidemia)   . Palpitations     Reported history of PVCs  . Chronic chest pain   . Chronic sinus bradycardia      Allergies  Allergen Reactions  . Vancomycin Other (See Comments)    Was informed after heart surgery ? Breathing problem/patient unsure     Current Outpatient Prescriptions  Medication Sig Dispense Refill  . apixaban (ELIQUIS) 5 MG TABS tablet Take 1 tablet (5 mg total) by mouth 2 (two) times daily. 60 tablet 11  . atorvastatin (LIPITOR) 20 MG tablet Take 1 tablet (20 mg total) by mouth daily. 30 tablet 11  . citalopram (CELEXA) 40 MG tablet Take 1 tablet by mouth every day 90 tablet 0  . Coenzyme Q10 (COQ10 PO) Take 400 mg by  mouth daily.     Marland Kitchen diltiazem (CARDIZEM CD) 240 MG 24 hr capsule Take 1 capsule (240 mg total) by mouth daily. 30 capsule 5  . fish oil-omega-3 fatty acids 1000 MG capsule Take 3 g by mouth daily.      . furosemide (LASIX) 40 MG tablet Take 1 tablet (40 mg total) by mouth as needed (Swelling). (Patient taking differently: Take 40 mg by mouth daily. ) 30 tablet 11  . irbesartan (AVAPRO) 300 MG tablet Take 1 tablet (300 mg total) by mouth at bedtime. 30 tablet 11  . metoprolol succinate (TOPROL-XL) 50 MG 24 hr tablet Take 50 mg twice a day 180 tablet 4  . Multiple Vitamin (MULTIVITAMIN) tablet Take 1 tablet by mouth daily.      Marland Kitchen omeprazole (PRILOSEC) 20 MG capsule Take 20 mg by mouth 2 (two) times daily before a meal.     . [DISCONTINUED] valsartan (DIOVAN) 320 MG tablet Take 1 tablet (320 mg total) by mouth daily. 30 tablet 5   No current facility-administered medications for this visit.     Past Surgical History  Procedure Laterality Date  . Coronary artery bypass graft      2004  . Back surgery  2011  . Transthoracic echocardiogram  08/25/2010  EF 55-60%  . Right foot fracture    . Hemorrhoidectomy    . Lumbar laminectomy    . Carpal tunnel release      bilateral  . Fistula surgery       Allergies  Allergen Reactions  . Vancomycin Other (See Comments)    Was informed after heart surgery ? Breathing problem/patient unsure      Family History  Problem Relation Age of Onset  . Breast cancer Mother   . Hypertension Mother   . Coronary artery disease Father   . Stroke Father   . Hypertension Father      Social History Mr. Springborn reports that he has never smoked. He has never used smokeless tobacco. Mr. Loren reports that he does not drink alcohol.   Review of Systems CONSTITUTIONAL: No weight loss, fever, chills, weakness or fatigue.  HEENT: Eyes: No visual loss, blurred vision, double vision or yellow sclerae.No hearing loss, sneezing, congestion, runny nose or  sore throat.  SKIN: No rash or itching.  CARDIOVASCULAR: per HPI RESPIRATORY: No shortness of breath, cough or sputum.  GASTROINTESTINAL: No anorexia, nausea, vomiting or diarrhea. No abdominal pain or blood.  GENITOURINARY: No burning on urination, no polyuria NEUROLOGICAL: No headache, dizziness, syncope, paralysis, ataxia, numbness or tingling in the extremities. No change in bowel or bladder control.  MUSCULOSKELETAL: No muscle, back pain, joint pain or stiffness.  LYMPHATICS: No enlarged nodes. No history of splenectomy.  PSYCHIATRIC: No history of depression or anxiety.  ENDOCRINOLOGIC: No reports of sweating, cold or heat intolerance. No polyuria or polydipsia.  Marland Kitchen   Physical Examination Filed Vitals:   04/10/15 1342  BP: 138/70  Pulse: 48   Filed Vitals:   04/10/15 1342  Height:  (1.676 m)  Weight: 255 lb (115.667 kg)    Gen: resting comfortably, no acute distress HEENT: no scleral icterus, pupils equal round and reactive, no palptable cervical adenopathy,  CV: RRR, no m/r/g, no JVD Resp: Clear to auscultation bilaterally GI: abdomen is soft, non-tender, non-distended, normal bowel sounds, no hepatosplenomegaly MSK: extremities are warm, no edema.  Skin: warm, no rash Neuro:  no focal deficits Psych: appropriate affect   Diagnostic Studies 03/2015 echo Study Conclusions  - Left ventricle: The cavity size was normal. Wall thickness was increased in a pattern of mild LVH. Systolic function was normal. The estimated ejection fraction was in the range of 50% to 55%. Wall motion was normal; there were no regional wall motion abnormalities. Doppler parameters are consistent with high ventricular filling pressure. - Left atrium: The atrium was mildly dilated.   03/2015 Lexiscan MPI  No T wave inversion was noted during stress.  There was no ST segment deviation noted during stress.  The study is normal.  This is a low risk study.  The left  ventricular ejection fraction is normal (55-65%).  Nuclear stress EF: 61%.   Assessment and Plan  1. CAD  - no current symptoms  - continue current medications, continue risk factor modficiation   2. HTN  - at goal, continue current meds - he will keep bp log and submit in 2 weeks.   3. Hyperlipidemia  - continue current statin, patient reports previous side effects on high doses of statin   4. OSA  - continue CPAP  5. Afib - no significant current symptoms. Continue current meds. CHADS2Vasc score of 2, continue eliquis (HTN, CAD)  F/u 3 months      Antoine Poche, M.D.

## 2015-04-18 ENCOUNTER — Telehealth: Payer: Self-pay | Admitting: *Deleted

## 2015-04-18 NOTE — Telephone Encounter (Signed)
Pt wants to know if HR is ok in 90's when walking around house. HR has been running 49-77 resting normally BP 135/79. Pt denies chest pain/dizziness/SOB. Pt wants to know if HR is ok at 91 since he has not had it go up that high since starting diltiazem and being in hospital. Pt would like to be seen and discuss meds/BP/HR. Will forward to [name]

## 2015-04-19 NOTE — Telephone Encounter (Signed)
Pt aware and scheduled for 7/20 with [name]

## 2015-04-19 NOTE — Telephone Encounter (Signed)
HR in 90's with activity ok. Can make f/u appt with [name] to discuss further med management next week.

## 2015-04-23 ENCOUNTER — Telehealth: Payer: Self-pay

## 2015-04-23 NOTE — Telephone Encounter (Signed)
Prior auth for Eliquis 5 mg sent to [facility] via Cover My Meds.

## 2015-04-24 ENCOUNTER — Ambulatory Visit (INDEPENDENT_AMBULATORY_CARE_PROVIDER_SITE_OTHER): Payer: PRIVATE HEALTH INSURANCE | Admitting: Cardiology

## 2015-04-24 ENCOUNTER — Encounter: Payer: Self-pay | Admitting: Cardiology

## 2015-04-24 VITALS — BP 167/79 | HR 51 | Ht 66.0 in | Wt 250.8 lb

## 2015-04-24 DIAGNOSIS — I10 Essential (primary) hypertension: Secondary | ICD-10-CM | POA: Diagnosis not present

## 2015-04-24 MED ORDER — HYDROCHLOROTHIAZIDE 25 MG PO TABS: 25.0000 mg | ORAL_TABLET | Freq: Every day | ORAL | Status: DC

## 2015-04-24 MED ORDER — FUROSEMIDE 40 MG PO TABS: ORAL_TABLET | ORAL | Status: DC

## 2015-04-24 NOTE — Progress Notes (Signed)
Patient ID: [name] [name], male   DOB: 06-29-1951, 64 y.o.   MRN: [id number]     Clinical Summary [name] [name] is a 64 y.o.male seen today for follow up of the following medical problems. This is a focused visit on his history of HTN, for more detailed medical history please refer to prior clinic notes.    1. HTN  - has noted eleved bp's on his regular home checks, with SBP in 140-150s - compliant with meds        Past Medical History  Diagnosis Date  . Sleep apnea     on CPAP  . Coronary artery disease     2v CABG, 2004 [facility])  . MI, old 2004  . Hypertension   . Obesity   . OA (osteoarthritis)   . DJD (degenerative joint disease)   . Spinal stenosis   . Edema   . HLD (hyperlipidemia)   . Palpitations     Reported history of PVCs  . Chronic chest pain   . Chronic sinus bradycardia      Allergies  Allergen Reactions  . Vancomycin Other (See Comments)    Was informed after heart surgery ? Breathing problem/patient unsure     Current Outpatient Prescriptions  Medication Sig Dispense Refill  . acetaminophen (TYLENOL) 650 MG CR tablet Take 650 mg by mouth 3 (three) times a week.    Marland Kitchen apixaban (ELIQUIS) 5 MG TABS tablet Take 1 tablet (5 mg total) by mouth 2 (two) times daily. 60 tablet 11  . atorvastatin (LIPITOR) 20 MG tablet Take 1 tablet (20 mg total) by mouth daily. 30 tablet 11  . citalopram (CELEXA) 40 MG tablet Take 1 tablet by mouth every day 90 tablet 0  . Coenzyme Q10 (COQ10 PO) Take 400 mg by mouth daily.     Marland Kitchen diltiazem (CARDIZEM CD) 240 MG 24 hr capsule Take 1 capsule (240 mg total) by mouth daily. 30 capsule 5  . fish oil-omega-3 fatty acids 1000 MG capsule Take 3 g by mouth 2 (two) times daily.     . furosemide (LASIX) 40 MG tablet Take 1 tablet (40 mg total) by mouth as needed (Swelling). (Patient taking differently: Take 40 mg by mouth every other day. ) 30 tablet 11  . irbesartan (AVAPRO) 300 MG tablet Take 1 tablet (300 mg total) by mouth at  bedtime. 30 tablet 11  . metoprolol succinate (TOPROL-XL) 50 MG 24 hr tablet Take 50 mg twice a day 180 tablet 4  . Multiple Vitamin (MULTIVITAMIN) tablet Take 1 tablet by mouth daily.      Marland Kitchen omeprazole (PRILOSEC) 20 MG capsule Take 20 mg by mouth every other day.     . [DISCONTINUED] valsartan (DIOVAN) 320 MG tablet Take 1 tablet (320 mg total) by mouth daily. 30 tablet 5   No current facility-administered medications for this visit.     Past Surgical History  Procedure Laterality Date  . Coronary artery bypass graft      2004  . Back surgery  2011  . Transthoracic echocardiogram  08/25/2010    EF 55-60%  . Right foot fracture    . Hemorrhoidectomy    . Lumbar laminectomy    . Carpal tunnel release      bilateral  . Fistula surgery       Allergies  Allergen Reactions  . Vancomycin Other (See Comments)    Was informed after heart surgery ? Breathing problem/patient unsure      Family History  Problem Relation Age of Onset  . Breast cancer Mother   . Hypertension Mother   . Coronary artery disease Father   . Stroke Father   . Hypertension Father      Social History [name] [name] reports that he has never smoked. He has never used smokeless tobacco. [name] [name] reports that he does not drink alcohol.   Review of Systems CONSTITUTIONAL: No weight loss, fever, chills, weakness or fatigue.  HEENT: Eyes: No visual loss, blurred vision, double vision or yellow sclerae.No hearing loss, sneezing, congestion, runny nose or sore throat.  SKIN: No rash or itching.  CARDIOVASCULAR: no chest pain, no palpitations RESPIRATORY: No shortness of breath, cough or sputum.  GASTROINTESTINAL: No anorexia, nausea, vomiting or diarrhea. No abdominal pain or blood.  GENITOURINARY: No burning on urination, no polyuria NEUROLOGICAL: No headache, dizziness, syncope, paralysis, ataxia, numbness or tingling in the extremities. No change in bowel or bladder control.  MUSCULOSKELETAL: No  muscle, back pain, joint pain or stiffness.  LYMPHATICS: No enlarged nodes. No history of splenectomy.  PSYCHIATRIC: No history of depression or anxiety.  ENDOCRINOLOGIC: No reports of sweating, cold or heat intolerance. No polyuria or polydipsia.  Marland Kitchen   Physical Examination Filed Vitals:   04/24/15 1548  BP: 167/79  Pulse: 51   Filed Vitals:   04/24/15 1548  Height: 5\' 6"  (1.676 m)  Weight: 250 lb 12.8 oz (113.762 kg)    Gen: resting comfortably, no acute distress HEENT: no scleral icterus, pupils equal round and reactive, no palptable cervical adenopathy,  CV: RRR, no m/r/g, no jVD Resp: Clear to auscultation bilaterally GI: abdomen is soft, non-tender, non-distended, normal bowel sounds, no hepatosplenomegaly MSK: extremities are warm, no edema.  Skin: warm, no rash Neuro:  no focal deficits Psych: appropriate affect   Diagnostic Studies 03/2015 echo Study Conclusions  - Left ventricle: The cavity size was normal. Wall thickness was increased in a pattern of mild LVH. Systolic function was normal. The estimated ejection fraction was in the range of 50% to 55%. Wall motion was normal; there were no regional wall motion abnormalities. Doppler parameters are consistent with high ventricular filling pressure. - Left atrium: The atrium was mildly dilated.   03/2015 Lexiscan MPI  No T wave inversion was noted during stress.  There was no ST segment deviation noted during stress.  The study is normal.  This is a low risk study.  The left ventricular ejection fraction is normal (55-65%).  Nuclear stress EF: 61%.    Assessment and Plan  1. HTN - above goal, will start HCTZ 25mg  daily. Change his lasix to prn only for swellling. Keep bp log x 2 weeks and submit   F/u 2 months       Antoine Poche, M.D.

## 2015-04-24 NOTE — Patient Instructions (Signed)
Your physician recommends that you schedule a follow-up appointment in: 2 MONTHS WITH DR. BRANCH  Your physician has recommended you make the following change in your medication:   TAKE LASIX 40 MG DAILY AS NEEDED FOR SWELLING   START HCTZ 25 MG DAILY   Your physician has requested that you regularly monitor and record your blood pressure readings at home FOR 2 WEEKS AND CALL OUR OFFICE . Please use the same machine at the same time of day to check your readings and record them to bring to your follow-up visit.  WE HAVE GIVEN YOU SAMPLES OF ELIQUIS   Thank you for choosing [facility]!!

## 2015-05-07 ENCOUNTER — Telehealth: Payer: Self-pay

## 2015-05-07 NOTE — Telephone Encounter (Signed)
Approval of Eliquis 5 mg from [place]. Berkley Harvey #[id number]. Local pharmacy notified,

## 2015-05-22 ENCOUNTER — Ambulatory Visit: Payer: No Typology Code available for payment source | Admitting: Pulmonary Disease

## 2015-06-17 ENCOUNTER — Encounter: Payer: Self-pay | Admitting: Cardiology

## 2015-06-17 ENCOUNTER — Ambulatory Visit (INDEPENDENT_AMBULATORY_CARE_PROVIDER_SITE_OTHER): Payer: PRIVATE HEALTH INSURANCE | Admitting: Cardiology

## 2015-06-17 VITALS — BP 163/76 | HR 50 | Ht 66.0 in | Wt 250.4 lb

## 2015-06-17 DIAGNOSIS — I10 Essential (primary) hypertension: Secondary | ICD-10-CM

## 2015-06-17 DIAGNOSIS — I509 Heart failure, unspecified: Secondary | ICD-10-CM | POA: Diagnosis not present

## 2015-06-17 MED ORDER — APIXABAN 5 MG PO TABS: 5.0000 mg | ORAL_TABLET | Freq: Two times a day (BID) | ORAL | Status: DC

## 2015-06-17 MED ORDER — HYDROCHLOROTHIAZIDE 25 MG PO TABS: 25.0000 mg | ORAL_TABLET | Freq: Every day | ORAL | Status: DC

## 2015-06-17 MED ORDER — FUROSEMIDE 40 MG PO TABS: ORAL_TABLET | ORAL | Status: DC

## 2015-06-17 MED ORDER — IRBESARTAN 300 MG PO TABS: 300.0000 mg | ORAL_TABLET | Freq: Every day | ORAL | Status: DC

## 2015-06-17 MED ORDER — ATORVASTATIN CALCIUM 20 MG PO TABS: 20.0000 mg | ORAL_TABLET | Freq: Every day | ORAL | Status: DC

## 2015-06-17 MED ORDER — METOPROLOL SUCCINATE ER 50 MG PO TB24: ORAL_TABLET | ORAL | Status: DC

## 2015-06-17 MED ORDER — DILTIAZEM HCL ER COATED BEADS 240 MG PO CP24: 240.0000 mg | ORAL_CAPSULE | Freq: Every day | ORAL | Status: DC

## 2015-06-17 MED ORDER — SPIRONOLACTONE 25 MG PO TABS: 25.0000 mg | ORAL_TABLET | Freq: Every day | ORAL | Status: DC

## 2015-06-17 NOTE — Progress Notes (Signed)
Patient ID: CARLITOS BOTTINO, male   DOB: 1951/02/11, 64 y.o.   MRN: [id number]     Clinical Summary Mr. Ono is a 64 y.o.male seen today for follow up of the following medical problems. This is a focused visit on his history of HTN.   1. HTN  - last visit started on HCTZ  daily - home bp log shows SBPs 135-145/70-80s, on average systolis still in mid 140s - compliant with meds     Past Medical History  Diagnosis Date  . Sleep apnea     on CPAP  . Coronary artery disease     2v CABG, 2004 [facility])  . MI, old 2004  . Hypertension   . Obesity   . OA (osteoarthritis)   . DJD (degenerative joint disease)   . Spinal stenosis   . Edema   . HLD (hyperlipidemia)   . Palpitations     Reported history of PVCs  . Chronic chest pain   . Chronic sinus bradycardia      Allergies  Allergen Reactions  . Vancomycin Other (See Comments)    Was informed after heart surgery ? Breathing problem/patient unsure     Current Outpatient Prescriptions  Medication Sig Dispense Refill  . acetaminophen (TYLENOL) 650 MG CR tablet Take 650 mg by mouth 3 (three) times a week.    Marland Kitchen apixaban (ELIQUIS) 5 MG TABS tablet Take 1 tablet (5 mg total) by mouth 2 (two) times daily. 60 tablet 11  . atorvastatin (LIPITOR) 20 MG tablet Take 1 tablet (20 mg total) by mouth daily. 30 tablet 11  . citalopram (CELEXA) 40 MG tablet Take 1 tablet by mouth every day 90 tablet 0  . Coenzyme Q10 (COQ10 PO) Take 400 mg by mouth daily.     Marland Kitchen diltiazem (CARDIZEM CD) 240 MG 24 hr capsule Take 1 capsule (240 mg total) by mouth daily. 30 capsule 5  . fish oil-omega-3 fatty acids 1000 MG capsule Take 3 g by mouth 2 (two) times daily.     . furosemide (LASIX) 40 MG tablet Take 1 tab daily as needed for swellling 90 tablet 3  . hydrochlorothiazide (HYDRODIURIL) 25 MG tablet Take 1 tablet (25 mg total) by mouth daily. 90 tablet 3  . irbesartan (AVAPRO) 300 MG tablet Take 1 tablet (300 mg total) by mouth at bedtime. 30  tablet 11  . metoprolol succinate (TOPROL-XL) 50 MG 24 hr tablet Take 50 mg twice a day 180 tablet 4  . Multiple Vitamin (MULTIVITAMIN) tablet Take 1 tablet by mouth daily.      Marland Kitchen omeprazole (PRILOSEC) 20 MG capsule Take 20 mg by mouth every other day.     . [DISCONTINUED] valsartan (DIOVAN) 320 MG tablet Take 1 tablet (320 mg total) by mouth daily. 30 tablet 5   No current facility-administered medications for this visit.     Past Surgical History  Procedure Laterality Date  . Coronary artery bypass graft      2004  . Back surgery  2011  . Transthoracic echocardiogram  08/25/2010    EF 55-60%  . Right foot fracture    . Hemorrhoidectomy    . Lumbar laminectomy    . Carpal tunnel release      bilateral  . Fistula surgery       Allergies  Allergen Reactions  . Vancomycin Other (See Comments)    Was informed after heart surgery ? Breathing problem/patient unsure      Family History  Problem Relation  Age of Onset  . Breast cancer Mother   . Hypertension Mother   . Coronary artery disease Father   . Stroke Father   . Hypertension Father      Social History Mr. Morale reports that he has never smoked. He has never used smokeless tobacco. Mr. Marocco reports that he does not drink alcohol.   Review of Systems CONSTITUTIONAL: No weight loss, fever, chills, weakness or fatigue.  HEENT: Eyes: No visual loss, blurred vision, double vision or yellow sclerae.No hearing loss, sneezing, congestion, runny nose or sore throat.  SKIN: No rash or itching.  CARDIOVASCULAR: per HPI RESPIRATORY: No shortness of breath, cough or sputum.  GASTROINTESTINAL: No anorexia, nausea, vomiting or diarrhea. No abdominal pain or blood.  GENITOURINARY: No burning on urination, no polyuria NEUROLOGICAL: No headache, dizziness, syncope, paralysis, ataxia, numbness or tingling in the extremities. No change in bowel or bladder control.  MUSCULOSKELETAL: No muscle, back pain, joint pain or  stiffness.  LYMPHATICS: No enlarged nodes. No history of splenectomy.  PSYCHIATRIC: No history of depression or anxiety.  ENDOCRINOLOGIC: No reports of sweating, cold or heat intolerance. No polyuria or polydipsia.  Marland Kitchen   Physical Examination Filed Vitals:   06/17/15 0851  BP: 163/76  Pulse: 50   Filed Vitals:   06/17/15 0851  Height: 5\' 6"  (1.676 m)  Weight: 250 lb 6.4 oz (113.581 kg)    Gen: resting comfortably, no acute distress HEENT: no scleral icterus, pupils equal round and reactive, no palptable cervical adenopathy,  CV: RRR, no m/r/g, no JVD Resp: Clear to auscultation bilaterally GI: abdomen is soft, non-tender, non-distended, normal bowel sounds, no hepatosplenomegaly MSK: extremities are warm, no edema.  Skin: warm, no rash Neuro:  no focal deficits Psych: appropriate affect   Diagnostic Studies  03/2015 echo Study Conclusions  - Left ventricle: The cavity size was normal. Wall thickness was increased in a pattern of mild LVH. Systolic function was normal. The estimated ejection fraction was in the range of 50% to 55%. Wall motion was normal; there were no regional wall motion abnormalities. Doppler parameters are consistent with high ventricular filling pressure. - Left atrium: The atrium was mildly dilated.   03/2015 Lexiscan MPI  No T wave inversion was noted during stress.  There was no ST segment deviation noted during stress.  The study is normal.  This is a low risk study.  The left ventricular ejection fraction is normal (55-65%).  Nuclear stress EF: 61%.     Assessment and Plan  1. 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     F/u 2 months  Antoine Poche, M.D.

## 2015-06-17 NOTE — Patient Instructions (Signed)
Your physician recommends that you schedule a follow-up appointment in: 2 MONTHS WITH DR. BRANCH  Your physician has recommended you make the following change in your medication:   START ALDACTONE 25 MG DAILY  Your physician recommends that you return for lab work BMP/FREET4/T3  Thank you for choosing [facility]!!

## 2015-06-17 NOTE — Addendum Note (Signed)
Addended by: Burman Nieves T on: 06/17/2015 09:48 AM   Modules accepted: Orders

## 2015-06-28 ENCOUNTER — Telehealth: Payer: Self-pay | Admitting: *Deleted

## 2015-06-28 NOTE — Telephone Encounter (Signed)
Patient called to inform office that last week, he was started on a new medication (spironolactone) by our office visit. Patient c/o itching and tingling on the cubital area of left arm for the past 2-3 days. No c/o chest pain, dizziness or sob. Patient informed nurse that he wasn't bad but he just wanted to make [name] aware of this. Patient advised to apply some hydrocortisone cream to the area that's itching and also take some benadryl and if this didn't help his symptoms or his symptoms get worse to contact our office back. Patient verbalized understanding of plan.

## 2015-06-28 NOTE — Telephone Encounter (Signed)
Agree with your plan  Dominga Ferry MD

## 2015-07-15 ENCOUNTER — Other Ambulatory Visit: Payer: Self-pay | Admitting: Cardiology

## 2015-07-15 ENCOUNTER — Ambulatory Visit (INDEPENDENT_AMBULATORY_CARE_PROVIDER_SITE_OTHER): Payer: PRIVATE HEALTH INSURANCE

## 2015-07-15 ENCOUNTER — Telehealth: Payer: Self-pay | Admitting: Cardiology

## 2015-07-15 DIAGNOSIS — Z23 Encounter for immunization: Secondary | ICD-10-CM | POA: Diagnosis not present

## 2015-07-15 MED ORDER — APIXABAN 5 MG PO TABS: 5.0000 mg | ORAL_TABLET | Freq: Two times a day (BID) | ORAL | Status: DC

## 2015-07-15 NOTE — Telephone Encounter (Signed)
Wanting Eliquis samples

## 2015-07-15 NOTE — Telephone Encounter (Signed)
Samples available for pickup, pt aware

## 2015-07-16 LAB — BASIC METABOLIC PANEL
BUN: 22 mg/dL (ref 7–25)
CO2: 26 mmol/L (ref 20–31)
CREATININE: 1.2 mg/dL (ref 0.70–1.25)
Calcium: 10 mg/dL (ref 8.6–10.3)
Chloride: 100 mmol/L (ref 98–110)
Glucose, Bld: 121 mg/dL — ABNORMAL HIGH (ref 65–99)
Potassium: 4.8 mmol/L (ref 3.5–5.3)
Sodium: 137 mmol/L (ref 135–146)

## 2015-07-16 LAB — T3: T3, Total: 100.7 ng/dL (ref 80.0–204.0)

## 2015-07-16 LAB — T4, FREE: Free T4: 0.83 ng/dL (ref 0.80–1.80)

## 2015-07-18 ENCOUNTER — Institutional Professional Consult (permissible substitution): Payer: PRIVATE HEALTH INSURANCE | Admitting: Internal Medicine

## 2015-07-18 ENCOUNTER — Telehealth: Payer: Self-pay | Admitting: *Deleted

## 2015-07-18 NOTE — Telephone Encounter (Signed)
-----   Message from Antoine Poche, MD sent at 07/17/2015  2:10 PM EDT ----- Labs look good  Dominga Ferry MD

## 2015-07-18 NOTE — Telephone Encounter (Signed)
Pt aware, routed to pcp 

## 2015-07-30 ENCOUNTER — Ambulatory Visit: Payer: PRIVATE HEALTH INSURANCE | Admitting: Internal Medicine

## 2015-08-01 ENCOUNTER — Encounter: Payer: Self-pay | Admitting: Internal Medicine

## 2015-08-01 ENCOUNTER — Ambulatory Visit (INDEPENDENT_AMBULATORY_CARE_PROVIDER_SITE_OTHER): Payer: PRIVATE HEALTH INSURANCE | Admitting: Internal Medicine

## 2015-08-01 VITALS — BP 140/70 | HR 52 | Temp 97.8°F | Resp 16 | Ht 66.0 in | Wt 250.0 lb

## 2015-08-01 DIAGNOSIS — F329 Major depressive disorder, single episode, unspecified: Secondary | ICD-10-CM | POA: Diagnosis not present

## 2015-08-01 DIAGNOSIS — R739 Hyperglycemia, unspecified: Secondary | ICD-10-CM

## 2015-08-01 DIAGNOSIS — I2510 Atherosclerotic heart disease of native coronary artery without angina pectoris: Secondary | ICD-10-CM

## 2015-08-01 DIAGNOSIS — I10 Essential (primary) hypertension: Secondary | ICD-10-CM | POA: Diagnosis not present

## 2015-08-01 DIAGNOSIS — E785 Hyperlipidemia, unspecified: Secondary | ICD-10-CM

## 2015-08-01 DIAGNOSIS — F32A Depression, unspecified: Secondary | ICD-10-CM

## 2015-08-01 MED ORDER — CITALOPRAM HYDROBROMIDE 40 MG PO TABS: 40.0000 mg | ORAL_TABLET | Freq: Every day | ORAL | Status: DC

## 2015-08-01 NOTE — Progress Notes (Signed)
Pre visit review using our clinic review tool, if applicable. No additional management support is needed unless otherwise documented below in the visit note. 

## 2015-08-01 NOTE — Progress Notes (Signed)
Subjective:  Patient ID: [name] [name], male    DOB: 1951-01-18  Age: 64 y.o. MRN: [id number]  CC: Hypertension and Depression   HPI [name] [name] presents for f/up on HTN and celexa refill. He feels well and offers no complaints.  Outpatient Prescriptions Prior to Visit  Medication Sig Dispense Refill  . acetaminophen (TYLENOL) 650 MG CR tablet Take 650 mg by mouth 3 (three) times a week.    Marland Kitchen apixaban (ELIQUIS) 5 MG TABS tablet Take 1 tablet (5 mg total) by mouth 2 (two) times daily. 28 tablet 0  . atorvastatin (LIPITOR) 20 MG tablet Take 1 tablet (20 mg total) by mouth daily. 90 tablet 3  . Coenzyme Q10 (COQ10 PO) Take 400 mg by mouth daily.     Marland Kitchen diltiazem (CARDIZEM CD) 240 MG 24 hr capsule Take 1 capsule (240 mg total) by mouth daily. 30 capsule 5  . fish oil-omega-3 fatty acids 1000 MG capsule Take 3 g by mouth 2 (two) times daily.     . furosemide (LASIX) 40 MG tablet Take 1 tab daily as needed for swellling 90 tablet 3  . hydrochlorothiazide (HYDRODIURIL) 25 MG tablet Take 1 tablet (25 mg total) by mouth daily. 90 tablet 3  . irbesartan (AVAPRO) 300 MG tablet Take 1 tablet (300 mg total) by mouth at bedtime. 90 tablet 3  . metoprolol succinate (TOPROL-XL) 50 MG 24 hr tablet Take 50 mg twice a day 180 tablet 3  . Multiple Vitamin (MULTIVITAMIN) tablet Take 1 tablet by mouth daily.      Marland Kitchen omeprazole (PRILOSEC) 20 MG capsule Take 20 mg by mouth every other day.     . spironolactone (ALDACTONE) 25 MG tablet Take 1 tablet (25 mg total) by mouth daily. 90 tablet 3  . citalopram (CELEXA) 40 MG tablet Take 1 tablet by mouth every day 90 tablet 0   No facility-administered medications prior to visit.    ROS Review of Systems  Constitutional: Negative.  Negative for fever, chills, diaphoresis, appetite change and fatigue.  HENT: Negative.   Eyes: Negative.   Respiratory: Negative.  Negative for cough, choking, chest tightness, shortness of breath and stridor.   Cardiovascular:  Negative.  Negative for chest pain, palpitations and leg swelling.  Gastrointestinal: Negative.  Negative for nausea, vomiting, abdominal pain, diarrhea, constipation and blood in stool.  Endocrine: Negative.   Genitourinary: Negative.   Musculoskeletal: Negative.  Negative for myalgias, back pain, joint swelling and arthralgias.  Skin: Negative.  Negative for color change and rash.  Allergic/Immunologic: Negative.   Neurological: Negative.  Negative for dizziness, syncope, speech difficulty, light-headedness, numbness and headaches.  Hematological: Negative.  Negative for adenopathy. Does not bruise/bleed easily.  Psychiatric/Behavioral: Positive for dysphoric mood. Negative for confusion, sleep disturbance and decreased concentration. The patient is not nervous/anxious.     Objective:  BP 140/70 mmHg  Pulse 52  Temp(Src) 97.8 F (36.6 C) (Oral)  Resp 16  Ht 5\' 6"  (1.676 m)  Wt 250 lb (113.399 kg)  BMI 40.37 kg/m2  SpO2 97%  BP Readings from Last 3 Encounters:  08/01/15 140/70  06/17/15 163/76  04/24/15 167/79    Wt Readings from Last 3 Encounters:  08/01/15 250 lb (113.399 kg)  06/17/15 250 lb 6.4 oz (113.581 kg)  04/24/15 250 lb 12.8 oz (113.762 kg)    Physical Exam  Constitutional: He is oriented to person, place, and time. He appears well-developed and well-nourished. No distress.  HENT:  Mouth/Throat: Oropharynx is clear  and moist. No oropharyngeal exudate.  Eyes: Conjunctivae are normal. Right eye exhibits no discharge. Left eye exhibits no discharge. No scleral icterus.  Neck: Normal range of motion. Neck supple. No JVD present. No tracheal deviation present. No thyromegaly present.  Cardiovascular: Normal rate, regular rhythm, normal heart sounds and intact distal pulses.  Exam reveals no gallop and no friction rub.   No murmur heard. Pulmonary/Chest: Effort normal and breath sounds normal. No stridor. No respiratory distress. He has no wheezes. He has no rales. He  exhibits no tenderness.  Abdominal: Soft. Bowel sounds are normal. He exhibits no distension and no mass. There is no tenderness. There is no rebound and no guarding.  Musculoskeletal: Normal range of motion. He exhibits no edema or tenderness.  Lymphadenopathy:    He has no cervical adenopathy.  Neurological: He is oriented to person, place, and time.  Skin: Skin is warm and dry. No rash noted. He is not diaphoretic. No erythema. No pallor.  Psychiatric: He has a normal mood and affect. His behavior is normal. Judgment and thought content normal.  Vitals reviewed.   Lab Results  Component Value Date   WBC 10.1 03/27/2015   HGB 16.1 03/27/2015   HCT 45.8 03/27/2015   PLT 317 03/27/2015   GLUCOSE 121* 07/15/2015   CHOL 191 07/25/2014   TRIG 181* 07/25/2014   HDL 39* 07/25/2014   LDLCALC 116* 07/25/2014   ALT 30 07/25/2014   AST 28 07/25/2014   NA 137 07/15/2015   K 4.8 07/15/2015   CL 100 07/15/2015   CREATININE 1.20 07/15/2015   BUN 22 07/15/2015   CO2 26 07/15/2015   TSH 4.606* 03/27/2015   HGBA1C 6.2* 03/28/2015    Nm Myocar Multi W/spect W/wall Motion / Ef  03/28/2015   No T wave inversion was noted during stress.  There was no ST segment deviation noted during stress.  The study is normal.  This is a low risk study.  The left ventricular ejection fraction is normal (55-65%).  Nuclear stress EF: 61%.  Normal Lexiscan Myoview study. No evidence of ischemia.  Normal LV function with EF of 61%.   Dg Chest Port 1 View  03/27/2015  CLINICAL DATA:  One day history of shortness of breath and chest pain EXAM: PORTABLE CHEST - 1 VIEW COMPARISON:  Chest radiograph August 23, 2010; chest CT August 23, 2010 FINDINGS: There is no edema or consolidation. Heart is upper normal in size with pulmonary vascularity within normal limits. Patient is status post coronary artery bypass grafting. No adenopathy. No pneumothorax. No appreciable bone lesions. IMPRESSION: No edema or  consolidation. Electronically Signed   By: Bretta Bang III M.D.   On: 03/27/2015 21:49    Assessment & Plan:   [name] [name] was seen today for hypertension and depression.  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  Essential hypertension- his BP is well controled -     CBC with Differential/Platelet; Future  Hyperlipidemia with target LDL less than 70- FLP and TSH today, he is doing well on the statin -     TSH; Future  Hyperglycemia- I will check his  A1C to see if he has developed DM2 -     Hemoglobin A1c; Future   I have changed Mr. Garber's citalopram. I am also having him maintain his Coenzyme Q10 (COQ10 PO), multivitamin, omeprazole, fish oil-omega-3 fatty acids, acetaminophen, spironolactone, metoprolol succinate, irbesartan, hydrochlorothiazide, furosemide, diltiazem, atorvastatin, and apixaban.  Meds ordered this encounter  Medications  . citalopram (CELEXA) 40 MG tablet    Sig: Take 1 tablet (40 mg total) by mouth daily.    Dispense:  90 tablet    Refill:  3    Refill [id number]     Follow-up: Return in about 6 months (around 01/30/2016).  Sanda Linger, MD

## 2015-08-01 NOTE — Patient Instructions (Signed)

## 2015-08-19 ENCOUNTER — Ambulatory Visit (INDEPENDENT_AMBULATORY_CARE_PROVIDER_SITE_OTHER): Payer: PRIVATE HEALTH INSURANCE | Admitting: Cardiology

## 2015-08-19 ENCOUNTER — Encounter: Payer: Self-pay | Admitting: Cardiology

## 2015-08-19 ENCOUNTER — Other Ambulatory Visit: Payer: Self-pay | Admitting: *Deleted

## 2015-08-19 VITALS — BP 150/71 | HR 52 | Ht 66.0 in | Wt 253.0 lb

## 2015-08-19 DIAGNOSIS — I10 Essential (primary) hypertension: Secondary | ICD-10-CM | POA: Diagnosis not present

## 2015-08-19 DIAGNOSIS — E785 Hyperlipidemia, unspecified: Secondary | ICD-10-CM | POA: Diagnosis not present

## 2015-08-19 DIAGNOSIS — I2510 Atherosclerotic heart disease of native coronary artery without angina pectoris: Secondary | ICD-10-CM | POA: Diagnosis not present

## 2015-08-19 DIAGNOSIS — I480 Paroxysmal atrial fibrillation: Secondary | ICD-10-CM | POA: Diagnosis not present

## 2015-08-19 MED ORDER — APIXABAN 5 MG PO TABS: 5.0000 mg | ORAL_TABLET | Freq: Two times a day (BID) | ORAL | Status: DC

## 2015-08-19 NOTE — Progress Notes (Signed)
Patient ID: [name] [name], male   DOB: [birth year], 64 y.o.   MRN: [id number]     Clinical Summary [name] [name] is a 64 y.o.male seen today for follow up of the following medical problems.   1. 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  - home bp's 130s/70s, heart rate low 50s.  - compliant with meds   2. CAD  - prior 2 vessel CABG in 2004 at [facility]  - apparently had follow up cath in 2006 from [place] with patent grafts. Reports negative stress test in 2011 by [name] [name] in [place].  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 No significant SOB or DOE - compliant with meds  3. Hyperlipidemia  - noted some side effects on higher lipitor doses, has tolerated lower dose well - no recent lipid panel in our system  4. OSA  - compliant with CPAP  5. Afib - new diagnosis during 03/2015 admission. - notes some occasional palpitations at times, though fairly infrequently  - Started on eliquis 5mg  bid during recent admission, denies any bleeding issues.  Past Medical History  Diagnosis Date  . Sleep apnea     on CPAP  . Coronary artery disease     2v CABG, 2004 [facility])  . MI, old 2004  . Hypertension   . Obesity   . OA (osteoarthritis)   . DJD (degenerative joint disease)   . Spinal stenosis   . Edema   . HLD (hyperlipidemia)   . Palpitations     Reported history of PVCs  . Chronic chest pain   . Chronic sinus bradycardia      Allergies  Allergen Reactions  . Vancomycin Other (See Comments)    Was informed after heart surgery ? Breathing problem/patient unsure     Current Outpatient Prescriptions  Medication Sig Dispense Refill  . acetaminophen (TYLENOL) 650 MG CR tablet Take 650 mg by mouth 3 (three) times a week.    Marland Kitchen apixaban (ELIQUIS) 5 MG TABS tablet Take 1 tablet (5 mg total) by mouth 2 (two) times daily. 28 tablet 0  .  atorvastatin (LIPITOR) 20 MG tablet Take 1 tablet (20 mg total) by mouth daily. 90 tablet 3  . citalopram (CELEXA) 40 MG tablet Take 1 tablet (40 mg total) by mouth daily. 90 tablet 3  . Coenzyme Q10 (COQ10 PO) Take 400 mg by mouth daily.     Marland Kitchen diltiazem (CARDIZEM CD) 240 MG 24 hr capsule Take 1 capsule (240 mg total) by mouth daily. 30 capsule 5  . fish oil-omega-3 fatty acids 1000 MG capsule Take 3 g by mouth 2 (two) times daily.     . furosemide (LASIX) 40 MG tablet Take 1 tab daily as needed for swellling 90 tablet 3  . hydrochlorothiazide (HYDRODIURIL) 25 MG tablet Take 1 tablet (25 mg total) by mouth daily. 90 tablet 3  . irbesartan (AVAPRO) 300 MG tablet Take 1 tablet (300 mg total) by mouth at bedtime. 90 tablet 3  . metoprolol succinate (TOPROL-XL) 50 MG 24 hr tablet Take 50 mg twice a day 180 tablet 3  . Multiple Vitamin (MULTIVITAMIN) tablet Take 1 tablet by mouth daily.      Marland Kitchen omeprazole (PRILOSEC) 20 MG capsule Take 20 mg by mouth every other day.     . spironolactone (ALDACTONE) 25 MG tablet Take 1 tablet (25 mg  total) by mouth daily. 90 tablet 3  . [DISCONTINUED] valsartan (DIOVAN) 320 MG tablet Take 1 tablet (320 mg total) by mouth daily. 30 tablet 5   No current facility-administered medications for this visit.     Past Surgical History  Procedure Laterality Date  . Coronary artery bypass graft      2004  . Back surgery  2011  . Transthoracic echocardiogram  08/25/2010    EF 55-60%  . Right foot fracture    . Hemorrhoidectomy    . Lumbar laminectomy    . Carpal tunnel release      bilateral  . Fistula surgery       Allergies  Allergen Reactions  . Vancomycin Other (See Comments)    Was informed after heart surgery ? Breathing problem/patient unsure      Family History  Problem Relation Age of Onset  . Breast cancer Mother   . Hypertension Mother   . Coronary artery disease Father   . Stroke Father   . Hypertension Father      Social History Mr.  [name] reports that he has never smoked. He has never used smokeless tobacco. [name] [name] reports that he does not drink alcohol.   Review of Systems CONSTITUTIONAL: No weight loss, fever, chills, weakness or fatigue.  HEENT: Eyes: No visual loss, blurred vision, double vision or yellow sclerae.No hearing loss, sneezing, congestion, runny nose or sore throat.  SKIN: No rash or itching.  CARDIOVASCULAR: per hpi RESPIRATORY: No shortness of breath, cough or sputum.  GASTROINTESTINAL: No anorexia, nausea, vomiting or diarrhea. No abdominal pain or blood.  GENITOURINARY: No burning on urination, no polyuria NEUROLOGICAL: No headache, dizziness, syncope, paralysis, ataxia, numbness or tingling in the extremities. No change in bowel or bladder control.  MUSCULOSKELETAL: No muscle, back pain, joint pain or stiffness.  LYMPHATICS: No enlarged nodes. No history of splenectomy.  PSYCHIATRIC: No history of depression or anxiety.  ENDOCRINOLOGIC: No reports of sweating, cold or heat intolerance. No polyuria or polydipsia.  Marland Kitchen   Physical Examination Filed Vitals:   08/19/15 0858  BP: 150/71  Pulse: 52   Filed Vitals:   08/19/15 0858  Height:  (1.676 m)  Weight: 253 lb (114.76 kg)    Gen: resting comfortably, no acute distress HEENT: no scleral icterus, pupils equal round and reactive, no palptable cervical adenopathy,  CV: RRR, no m/r/g, nojvd Resp: Clear to auscultation bilaterally GI: abdomen is soft, non-tender, non-distended, normal bowel sounds, no hepatosplenomegaly MSK: extremities are warm, no edema.  Skin: warm, no rash Neuro:  no focal deficits Psych: appropriate affect   Diagnostic Studies 03/2015 echo Study Conclusions  - Left ventricle: The cavity size was normal. Wall thickness was increased in a pattern of mild LVH. Systolic function was normal. The estimated ejection fraction was in the range of 50% to 55%. Wall motion was normal; there were no regional  wall motion abnormalities. Doppler parameters are consistent with high ventricular filling pressure. - Left atrium: The atrium was mildly dilated.   03/2015 Lexiscan MPI  No T wave inversion was noted during stress.  There was no ST segment deviation noted during stress.  The study is normal.  This is a low risk study.  The left ventricular ejection fraction is normal (55-65%).  Nuclear stress EF: 61%.        Assessment and Plan  1. HTN - at goal based on home numbers, continue current meds   2. CAD  - no current symptoms  -  continue current medications  3. Hyperlipidemia  - continue current statin, patient reports previous side effects on high doses of statin   4. OSA  - continue CPAP  5. Afib - no significant current symptoms. Continue current meds. CHADS2Vasc score of 2, continue eliquis (HTN, CAD)  F/u 6 months  Antoine Poche, M.D.

## 2015-08-19 NOTE — Patient Instructions (Signed)
Your physician wants you to follow-up in: 6 months with Dr. Lurena Joiner will receive a reminder letter in the mail two months in advance. If you don't receive a letter, please call our office to schedule the follow-up appointment.  Your physician recommends that you continue on your current medications as directed. Please refer to the Current Medication list given to you today.  WE WILL CALL YOU WHEN WE HAVE MORE ELIQUIS SAMPLES   Thank you for choosing [facility]!!

## 2015-08-26 ENCOUNTER — Other Ambulatory Visit: Payer: Self-pay | Admitting: *Deleted

## 2015-08-26 MED ORDER — APIXABAN 5 MG PO TABS: 5.0000 mg | ORAL_TABLET | Freq: Two times a day (BID) | ORAL | Status: DC

## 2015-12-11 ENCOUNTER — Other Ambulatory Visit: Payer: Self-pay | Admitting: *Deleted

## 2015-12-11 MED ORDER — APIXABAN 5 MG PO TABS: 5.0000 mg | ORAL_TABLET | Freq: Two times a day (BID) | ORAL | Status: DC

## 2016-02-06 ENCOUNTER — Ambulatory Visit (INDEPENDENT_AMBULATORY_CARE_PROVIDER_SITE_OTHER): Payer: Medicare HMO | Admitting: Cardiology

## 2016-02-06 ENCOUNTER — Encounter: Payer: Self-pay | Admitting: Cardiology

## 2016-02-06 VITALS — BP 138/73 | HR 53 | Ht 66.0 in | Wt 255.8 lb

## 2016-02-06 DIAGNOSIS — I10 Essential (primary) hypertension: Secondary | ICD-10-CM | POA: Diagnosis not present

## 2016-02-06 DIAGNOSIS — E785 Hyperlipidemia, unspecified: Secondary | ICD-10-CM

## 2016-02-06 DIAGNOSIS — R739 Hyperglycemia, unspecified: Secondary | ICD-10-CM | POA: Diagnosis not present

## 2016-02-06 DIAGNOSIS — I2510 Atherosclerotic heart disease of native coronary artery without angina pectoris: Secondary | ICD-10-CM

## 2016-02-06 MED ORDER — IRBESARTAN 300 MG PO TABS: 300.0000 mg | ORAL_TABLET | Freq: Every day | ORAL | Status: DC

## 2016-02-06 MED ORDER — ATORVASTATIN CALCIUM 20 MG PO TABS: 20.0000 mg | ORAL_TABLET | Freq: Every day | ORAL | Status: DC

## 2016-02-06 MED ORDER — SPIRONOLACTONE 25 MG PO TABS: 25.0000 mg | ORAL_TABLET | Freq: Every day | ORAL | Status: DC

## 2016-02-06 MED ORDER — HYDROCHLOROTHIAZIDE 25 MG PO TABS: 25.0000 mg | ORAL_TABLET | Freq: Every day | ORAL | Status: DC

## 2016-02-06 MED ORDER — APIXABAN 5 MG PO TABS: 5.0000 mg | ORAL_TABLET | Freq: Two times a day (BID) | ORAL | Status: DC

## 2016-02-06 MED ORDER — METOPROLOL SUCCINATE ER 50 MG PO TB24: 50.0000 mg | ORAL_TABLET | Freq: Two times a day (BID) | ORAL | Status: DC

## 2016-02-06 MED ORDER — DILTIAZEM HCL ER COATED BEADS 240 MG PO CP24: 240.0000 mg | ORAL_CAPSULE | Freq: Every day | ORAL | Status: DC

## 2016-02-06 NOTE — Progress Notes (Signed)
Patient ID: [name] [name], male   DOB: 1951/01/17, 65 y.o.   MRN: [id number]     Clinical Summary [name] is a 65 y.o.male seen today for follow up of the following medical problems  1. HTN   - home bp's 130s/70s. He is taking his meds daily.  - reports some mild discomfort in nipples over the last several weeks, of note  he is on aldactone.   2. CAD  - prior 2 vessel CABG in 2004 at [facility]  - apparently had follow up cath in 2006 from [place] with patent grafts. Reports negative stress test in 2011 by Dr [name] [name] in [place]. 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 No significant SOB or DOE since last visit.   3. Hyperlipidemia  - noted some side effects on higher lipitor doses, has tolerated lower dose well  4. OSA  - compliant with CPAP  5. Afib - denies any significant palpitations - denies any bleeding issues.     Past Medical History  Diagnosis Date  . Sleep apnea     on CPAP  . Coronary artery disease     2v CABG, 2004 [facility])  . MI, old 2004  . Hypertension   . Obesity   . OA (osteoarthritis)   . DJD (degenerative joint disease)   . Spinal stenosis   . Edema   . HLD (hyperlipidemia)   . Palpitations     Reported history of PVCs  . Chronic chest pain   . Chronic sinus bradycardia      Allergies  Allergen Reactions  . Nsaids Other (See Comments)    On blood thinner  . Vancomycin Other (See Comments)    Was informed after heart surgery ? Breathing problem/patient unsure     Current Outpatient Prescriptions  Medication Sig Dispense Refill  . acetaminophen (TYLENOL) 650 MG CR tablet Take 650 mg by mouth 3 (three) times a week.    Marland Kitchen allopurinol (ZYLOPRIM) 100 MG tablet Take 100 mg by mouth daily as needed.    Marland Kitchen apixaban (ELIQUIS) 5 MG TABS tablet Take 1 tablet (5 mg total) by mouth 2 (two) times daily. 56 tablet 0  . atorvastatin  (LIPITOR) 20 MG tablet Take 1 tablet (20 mg total) by mouth daily. 90 tablet 3  . citalopram (CELEXA) 40 MG tablet Take 1 tablet (40 mg total) by mouth daily. 90 tablet 3  . Coenzyme Q10 (COQ10 PO) Take 400 mg by mouth daily.     Marland Kitchen diltiazem (CARDIZEM CD) 240 MG 24 hr capsule Take 1 capsule (240 mg total) by mouth daily. 30 capsule 5  . fish oil-omega-3 fatty acids 1000 MG capsule Take 3 g by mouth 2 (two) times daily.     . furosemide (LASIX) 40 MG tablet Take 40 mg by mouth daily as needed.    . hydrochlorothiazide (HYDRODIURIL) 25 MG tablet Take 1 tablet (25 mg total) by mouth daily. 90 tablet 3  . irbesartan (AVAPRO) 300 MG tablet Take 1 tablet (300 mg total) by mouth at bedtime. 90 tablet 3  . metoprolol succinate (TOPROL-XL) 50 MG 24 hr tablet Take 50 mg twice a day 180 tablet 3  . Multiple Vitamin (MULTIVITAMIN) tablet Take 1 tablet by mouth daily.      Marland Kitchen omeprazole (PRILOSEC) 20 MG capsule Take 20 mg by mouth every other day.     . spironolactone (ALDACTONE)  25 MG tablet Take 1 tablet (25 mg total) by mouth daily. 90 tablet 3  . [DISCONTINUED] valsartan (DIOVAN) 320 MG tablet Take 1 tablet (320 mg total) by mouth daily. 30 tablet 5   No current facility-administered medications for this visit.     Past Surgical History  Procedure Laterality Date  . Coronary artery bypass graft      2004  . Back surgery  2011  . Transthoracic echocardiogram  08/25/2010    EF 55-60%  . Right foot fracture    . Hemorrhoidectomy    . Lumbar laminectomy    . Carpal tunnel release      bilateral  . Fistula surgery       Allergies  Allergen Reactions  . Nsaids Other (See Comments)    On blood thinner  . Vancomycin Other (See Comments)    Was informed after heart surgery ? Breathing problem/patient unsure      Family History  Problem Relation Age of Onset  . Breast cancer Mother   . Hypertension Mother   . Coronary artery disease Father   . Stroke Father   . Hypertension Father       Social History [name] reports that he has never smoked. He has never used smokeless tobacco. [name] reports that he does not drink alcohol.   Review of Systems CONSTITUTIONAL: No weight loss, fever, chills, weakness or fatigue.  HEENT: Eyes: No visual loss, blurred vision, double vision or yellow sclerae.No hearing loss, sneezing, congestion, runny nose or sore throat.  SKIN: No rash or itching.  CARDIOVASCULAR: per HPI RESPIRATORY: No shortness of breath, cough or sputum.  GASTROINTESTINAL: No anorexia, nausea, vomiting or diarrhea. No abdominal pain or blood.  GENITOURINARY: No burning on urination, no polyuria NEUROLOGICAL: No headache, dizziness, syncope, paralysis, ataxia, numbness or tingling in the extremities. No change in bowel or bladder control.  MUSCULOSKELETAL: No muscle, back pain, joint pain or stiffness.  LYMPHATICS: No enlarged nodes. No history of splenectomy.  PSYCHIATRIC: No history of depression or anxiety.  ENDOCRINOLOGIC: No reports of sweating, cold or heat intolerance. No polyuria or polydipsia.  Marland Kitchen   Physical Examination Filed Vitals:   02/06/16 1037  BP: 138/73  Pulse: 53   Filed Vitals:   02/06/16 1037  Height:  (1.676 m)  Weight: 255 lb 12.8 oz (116.03 kg)    Gen: resting comfortably, no acute distress HEENT: no scleral icterus, pupils equal round and reactive, no palptable cervical adenopathy,  CV: RRR, no m/r/g, no jvd Resp: Clear to auscultation bilaterally GI: abdomen is soft, non-tender, non-distended, normal bowel sounds, no hepatosplenomegaly MSK: extremities are warm, no edema.  Skin: warm, no rash Neuro:  no focal deficits Psych: appropriate affect   Diagnostic Studies 03/2015 echo Study Conclusions  - Left ventricle: The cavity size was normal. Wall thickness was increased in a pattern of mild LVH. Systolic function was normal. The estimated ejection fraction was in the range of 50% to 55%. Wall motion  was normal; there were no regional wall motion abnormalities. Doppler parameters are consistent with high ventricular filling pressure. - Left atrium: The atrium was mildly dilated.   03/2015 Lexiscan MPI  No T wave inversion was noted during stress.  There was no ST segment deviation noted during stress.  The study is normal.  This is a low risk study.  The left ventricular ejection fraction is normal (55-65%).  Nuclear stress EF: 61%.    Assessment and Plan   1. HTN -  at goal based on his home numbers - recent nipple sensitivity, unclear if side effect of aldactone. He will try holding for a month and follow symptoms and bp's   2. CAD  - no current symptoms  - continue current meds  3. Hyperlipidemia  - continue current statin, did not tolerate higher dose statin - repeat lipid panel  4. OSA  - continue CPAP  5. Afib - no significant current symptoms.  - we will continue current meds. CHADS2Vasc score of 2, continue eliquis (HTN, CAD)  F/u 1 year     Antoine Poche, M.D.

## 2016-02-06 NOTE — Patient Instructions (Signed)
Your physician wants you to follow-up in: 1 YEAR WITH DR. BRANCH You will receive a reminder letter in the mail two months in advance. If you don't receive a letter, please call our office to schedule the follow-up appointment.  Your physician has recommended you make the following change in your medication:   STOP LASIX  HOLD ALDACTONE FOR 1 MONTH AND CALL us WITH AN UPDATE ON YOUR SYMPTOMS  Your physician recommends that you return for lab work PLEASE FAST FOR LAB WORK LIPIDS/CBC/BMP/HGBA1C/TSH/MG  Thank you for choosing [facility]!!

## 2016-02-07 DIAGNOSIS — R739 Hyperglycemia, unspecified: Secondary | ICD-10-CM | POA: Diagnosis not present

## 2016-02-07 DIAGNOSIS — I10 Essential (primary) hypertension: Secondary | ICD-10-CM | POA: Diagnosis not present

## 2016-02-07 DIAGNOSIS — E785 Hyperlipidemia, unspecified: Secondary | ICD-10-CM | POA: Diagnosis not present

## 2016-02-07 DIAGNOSIS — I509 Heart failure, unspecified: Secondary | ICD-10-CM | POA: Diagnosis not present

## 2016-02-13 ENCOUNTER — Telehealth: Payer: Self-pay | Admitting: *Deleted

## 2016-02-13 NOTE — Telephone Encounter (Signed)
Pt made aware

## 2016-02-13 NOTE — Telephone Encounter (Signed)
-----   Message from Antoine Poche, MD sent at 02/12/2016 12:55 PM EDT ----- Ok thanks, only the HgbA1c was routed to my inbox. Rest of labs look good, his triglycerides are a little high, encourage continued exercise, weight loss, and cutting back on fatty foods  Dominga Ferry MD ----- Message -----    From: Albertine Patricia, CMA    Sent: 02/12/2016  12:48 PM      To: Antoine Poche, MD  The other labs are scanned in as well - BMP TSH MG CBC LIPIDS

## 2016-02-27 LAB — NM MYOCAR MULTI W/SPECT W/WALL MOTION / EF
CHL CUP NUCLEAR SDS: 0
CHL CUP NUCLEAR SRS: 5
LV dias vol: 95 mL
LV sys vol: 38 mL
RATE: 0.25
SSS: 5
TID: 1.07

## 2016-03-06 ENCOUNTER — Telehealth: Payer: Self-pay | Admitting: *Deleted

## 2016-03-06 NOTE — Telephone Encounter (Signed)
Pt will take 12.5 mg of spironolactone - will update [place] next week

## 2016-03-06 NOTE — Telephone Encounter (Signed)
Pt f/u from [facility] regarding breast tenderness from Aldactone - says this has resolved however systolic BP at night has been running 140-150s with HR remaining in the 50s when exercising runs in the 80s. Advised that pt continue to monitor BP and would forward to [name] for further suggestions

## 2016-03-06 NOTE — Telephone Encounter (Signed)
We can try back starting a low dose of the aldactone back. I believe he was as on 25mg  daily, he can try starting back 12.5mg  daily (1/2 tablet) and seeing how is bp's do and if any breast tenderness return. Have him keep [place] updated  J Kimberly Coye MD

## 2016-03-11 ENCOUNTER — Telehealth: Payer: Self-pay | Admitting: Cardiology

## 2016-03-11 MED ORDER — APIXABAN 5 MG PO TABS: 5.0000 mg | ORAL_TABLET | Freq: Two times a day (BID) | ORAL | Status: DC

## 2016-03-11 NOTE — Telephone Encounter (Signed)
Pt f/u from telephone call from last week - says BP has been trending down with taking 12.5 mg of spironolactone 120s-130s/70s says HR is remaining 48-52 when exercising HR 70s. Denies any breast tenderness at this time. Advised pt to continue 12.5 mg spironolactone and monitor BP and if symptoms develop to call back. Will forward to [name] for further suggestions. Pt also requesting samples of Eliquis will have ready and pt will pick up Friday

## 2016-03-11 NOTE — Telephone Encounter (Signed)
Mr. Wawro called requesting to speak to [facility].  Requested a phone call.

## 2016-03-12 NOTE — Telephone Encounter (Signed)
I agree, monitor bp's and side effects back  aldactone 12.5mg  daily

## 2016-05-12 ENCOUNTER — Telehealth: Payer: Self-pay | Admitting: *Deleted

## 2016-05-12 NOTE — Telephone Encounter (Signed)
Pt now says he hasn't been taking spironolactone daily, He takes 12.5 mg every other day then will hold it several days when his breast are tender. Pt says he took 1/2 tab this morning after he had not taken any in several days.

## 2016-05-12 NOTE — Telephone Encounter (Signed)
Pt says that BP has been gradually going up with systolic remaining between 150s-160s. This AM was 160/80. Pt has been taking 12.5 mg of spironolactone as instructed. Denies any symptoms but is concerned about increase in BP. Will forward to [name]

## 2016-05-12 NOTE — Telephone Encounter (Signed)
We can try changing him to eplerenone 25mg  daily. It works just like spironolactone without causing breast tenderness. It may be more expensive depending on insurance, we can send in the Rx and find out. He would start this and stop spironolactone  Dominga Ferry MD

## 2016-05-12 NOTE — Telephone Encounter (Signed)
We can increase his aldactone back to 25mg  daily, verify he has not been having any breast tenderness as this is a possible side effect and the reason we had decreased it before.   J Deloris Mittag MD

## 2016-05-13 MED ORDER — EPLERENONE 25 MG PO TABS: 25.0000 mg | ORAL_TABLET | Freq: Every day | ORAL | 3 refills | Status: DC

## 2016-05-13 NOTE — Telephone Encounter (Signed)
We have a few other options. We can try changing his HCTZ to chorthalidone 25mg  daily. These medicines are in the same family but chlorthalidone has better bp effect. Are able to find out the price chlorthalidone 25mg  daily would be. Other options would be to a medicine called hydralazine, however this typically is a 3 times a day medication. I would at least consider changing to chlorthalidone if the price is ok for him    Dominga Ferry MD

## 2016-05-13 NOTE — Telephone Encounter (Signed)
Inspra sent to pharmacy, pt says he went to pick this up and to expensive, $65/monthly and pt is already paying over $100 monthly with all other medication. Pt says he can deal with breast tenderness if there is no other option.

## 2016-05-13 NOTE — Telephone Encounter (Signed)
Pt aware and Inspra sent to pharmacy. Pt will call back if medication is to expensive

## 2016-05-14 NOTE — Addendum Note (Signed)
Addended by: Burman Nieves T on: 05/14/2016 11:20 AM   Modules accepted: Orders

## 2016-05-14 NOTE — Telephone Encounter (Signed)
Pt agreeable to changing HCTZ to chlorthalidone 25 mg daily. Pt says he wants to start after he gets back from vacation so that he can monitor BP at home. Pt will call back after he returns from the beach. Will update med list but wait for pt to call back to send chlorthalidone to pharmacy

## 2016-06-01 MED ORDER — CHLORTHALIDONE 25 MG PO TABS: 25.0000 mg | ORAL_TABLET | Freq: Every day | ORAL | 3 refills | Status: DC

## 2016-06-01 NOTE — Addendum Note (Signed)
Addended by: Burman Nieves T on: 06/01/2016 04:57 PM   Modules accepted: Orders

## 2016-06-01 NOTE — Telephone Encounter (Signed)
Per pt request - sent chlorthalidone to pharmacy as requested. Pt will stop HCTZ

## 2016-06-12 ENCOUNTER — Telehealth: Payer: Self-pay | Admitting: Internal Medicine

## 2016-06-12 NOTE — Telephone Encounter (Signed)
CY we do not have any openings and patient has never seen you for sleep-would you suggest him seeing another sleep MD here? Thanks.

## 2016-06-15 NOTE — Telephone Encounter (Signed)
Spoke with pt. He has been scheduled with RA on 07/09/16 at 11am. Nothing further was needed.

## 2016-06-15 NOTE — Telephone Encounter (Signed)
Appropriate to let him see another sleep doctor. Hopefully someone can accomodate his requested visit date.

## 2016-06-17 DIAGNOSIS — L821 Other seborrheic keratosis: Secondary | ICD-10-CM | POA: Diagnosis not present

## 2016-06-17 DIAGNOSIS — L578 Other skin changes due to chronic exposure to nonionizing radiation: Secondary | ICD-10-CM | POA: Diagnosis not present

## 2016-06-17 DIAGNOSIS — Z85828 Personal history of other malignant neoplasm of skin: Secondary | ICD-10-CM | POA: Diagnosis not present

## 2016-06-17 DIAGNOSIS — L820 Inflamed seborrheic keratosis: Secondary | ICD-10-CM | POA: Diagnosis not present

## 2016-06-17 DIAGNOSIS — L570 Actinic keratosis: Secondary | ICD-10-CM | POA: Diagnosis not present

## 2016-06-17 DIAGNOSIS — L905 Scar conditions and fibrosis of skin: Secondary | ICD-10-CM | POA: Diagnosis not present

## 2016-07-09 ENCOUNTER — Ambulatory Visit (INDEPENDENT_AMBULATORY_CARE_PROVIDER_SITE_OTHER): Payer: Medicare HMO | Admitting: Pulmonary Disease

## 2016-07-09 ENCOUNTER — Ambulatory Visit (INDEPENDENT_AMBULATORY_CARE_PROVIDER_SITE_OTHER): Payer: Medicare HMO | Admitting: Internal Medicine

## 2016-07-09 ENCOUNTER — Encounter: Payer: Self-pay | Admitting: Pulmonary Disease

## 2016-07-09 ENCOUNTER — Encounter: Payer: Self-pay | Admitting: Internal Medicine

## 2016-07-09 VITALS — BP 128/68 | HR 55 | Temp 97.7°F | Resp 16 | Ht 66.0 in | Wt 254.8 lb

## 2016-07-09 DIAGNOSIS — Z23 Encounter for immunization: Secondary | ICD-10-CM

## 2016-07-09 DIAGNOSIS — N400 Enlarged prostate without lower urinary tract symptoms: Secondary | ICD-10-CM | POA: Diagnosis not present

## 2016-07-09 DIAGNOSIS — E785 Hyperlipidemia, unspecified: Secondary | ICD-10-CM

## 2016-07-09 DIAGNOSIS — R7989 Other specified abnormal findings of blood chemistry: Secondary | ICD-10-CM | POA: Insufficient documentation

## 2016-07-09 DIAGNOSIS — Z1159 Encounter for screening for other viral diseases: Secondary | ICD-10-CM | POA: Insufficient documentation

## 2016-07-09 DIAGNOSIS — R946 Abnormal results of thyroid function studies: Secondary | ICD-10-CM | POA: Diagnosis not present

## 2016-07-09 DIAGNOSIS — Z9119 Patient's noncompliance with other medical treatment and regimen: Secondary | ICD-10-CM

## 2016-07-09 DIAGNOSIS — Z91199 Patient's noncompliance with other medical treatment and regimen due to unspecified reason: Secondary | ICD-10-CM

## 2016-07-09 DIAGNOSIS — G4733 Obstructive sleep apnea (adult) (pediatric): Secondary | ICD-10-CM | POA: Diagnosis not present

## 2016-07-09 DIAGNOSIS — F325 Major depressive disorder, single episode, in full remission: Secondary | ICD-10-CM

## 2016-07-09 DIAGNOSIS — I4891 Unspecified atrial fibrillation: Secondary | ICD-10-CM

## 2016-07-09 DIAGNOSIS — R739 Hyperglycemia, unspecified: Secondary | ICD-10-CM

## 2016-07-09 DIAGNOSIS — M1000 Idiopathic gout, unspecified site: Secondary | ICD-10-CM | POA: Insufficient documentation

## 2016-07-09 DIAGNOSIS — Z0000 Encounter for general adult medical examination without abnormal findings: Secondary | ICD-10-CM | POA: Diagnosis not present

## 2016-07-09 DIAGNOSIS — R001 Bradycardia, unspecified: Secondary | ICD-10-CM | POA: Diagnosis not present

## 2016-07-09 MED ORDER — CITALOPRAM HYDROBROMIDE 40 MG PO TABS: 40.0000 mg | ORAL_TABLET | Freq: Every day | ORAL | 3 refills | Status: DC

## 2016-07-09 NOTE — Progress Notes (Signed)
Subjective:  Patient ID: [name] [name], male    DOB: [birth year]  Age: 65 y.o. MRN: [id number]  CC: Annual Exam and Hyperlipidemia   HPI [name] [name] presents for a CPX.  He requests a refill for Celexa. He tells me his depression symptoms are well controlled as he has had no recent episodes of anhedonia, feeling helpless or hopeless, SI or HI. He tells me he recently saw his cardiologist ([name]) and had lab work done. None of those labs are in his medical records and I have no copies of them and he did not bring any copies today. His only other complaint today is bilateral knee pain. He denies any recent episodes of chest pain, shortness of breath, weight changes, polyuria, polydipsia, or polyphagia.  Past Medical History:  Diagnosis Date  . Chronic chest pain   . Chronic sinus bradycardia   . Coronary artery disease    2v CABG, 2004 [facility])  . DJD (degenerative joint disease)   . Edema   . HLD (hyperlipidemia)   . Hypertension   . MI, old 2004  . OA (osteoarthritis)   . Obesity   . Palpitations    Reported history of PVCs  . Sleep apnea    on CPAP  . Spinal stenosis    Past Surgical History:  Procedure Laterality Date  . BACK SURGERY  2011  . CARPAL TUNNEL RELEASE     bilateral  . CORONARY ARTERY BYPASS GRAFT     2004  . fistula surgery    . hemorrhoidectomy    . LUMBAR LAMINECTOMY    . right foot fracture    . TRANSTHORACIC ECHOCARDIOGRAM  08/25/2010   EF 55-60%    reports that he has never smoked. He has never used smokeless tobacco. He reports that he does not drink alcohol or use drugs. family history includes Breast cancer in his mother; Coronary artery disease in his father; Hypertension in his father and mother; Stroke in his father. Allergies  Allergen Reactions  . Nsaids Other (See Comments)    On blood thinner  . Vancomycin Other (See Comments)    Was informed after heart surgery ? Breathing problem/patient unsure    Outpatient Medications  Prior to Visit  Medication Sig Dispense Refill  . acetaminophen (TYLENOL) 650 MG CR tablet Take 650 mg by mouth 3 (three) times a week.    Marland Kitchen allopurinol (ZYLOPRIM) 100 MG tablet Take 100 mg by mouth daily as needed.    Marland Kitchen apixaban (ELIQUIS) 5 MG TABS tablet Take 1 tablet (5 mg total) by mouth 2 (two) times daily. 56 tablet 0  . atorvastatin (LIPITOR) 20 MG tablet Take 1 tablet (20 mg total) by mouth daily. 90 tablet 3  . chlorthalidone (HYGROTON) 25 MG tablet Take 1 tablet (25 mg total) by mouth daily. 90 tablet 3  . diltiazem (CARDIZEM CD) 240 MG 24 hr capsule Take 1 capsule (240 mg total) by mouth daily. 90 capsule 3  . fish oil-omega-3 fatty acids 1000 MG capsule Take 3 g by mouth 2 (two) times daily.     . irbesartan (AVAPRO) 300 MG tablet Take 1 tablet (300 mg total) by mouth at bedtime. 90 tablet 3  . metoprolol succinate (TOPROL-XL) 50 MG 24 hr tablet Take 1 tablet (50 mg total) by mouth 2 (two) times daily. 180 tablet 3  . omeprazole (PRILOSEC) 20 MG capsule Take 20 mg by mouth every other day.     . citalopram (CELEXA) 40 MG  tablet Take 1 tablet (40 mg total) by mouth daily. 90 tablet 3  . Coenzyme Q10 (COQ10 PO) Take 400 mg by mouth daily.     . Multiple Vitamin (MULTIVITAMIN) tablet Take 1 tablet by mouth daily.       No facility-administered medications prior to visit.     ROS Review of Systems  Constitutional: Negative for appetite change, chills, diaphoresis, fatigue and fever.  HENT: Negative.  Negative for congestion and trouble swallowing.   Eyes: Negative.   Respiratory: Positive for apnea. Negative for cough, choking, chest tightness, shortness of breath, wheezing and stridor.   Cardiovascular: Negative.  Negative for chest pain, palpitations and leg swelling.  Gastrointestinal: Negative for abdominal pain, blood in stool, constipation, diarrhea, nausea and vomiting.  Endocrine: Negative.   Genitourinary: Negative.  Negative for difficulty urinating.  Musculoskeletal:  Positive for arthralgias. Negative for gait problem, joint swelling and myalgias.  Skin: Negative.  Negative for color change.  Allergic/Immunologic: Negative.   Neurological: Negative.  Negative for dizziness.  Hematological: Negative.  Negative for adenopathy. Does not bruise/bleed easily.  Psychiatric/Behavioral: Negative.  Negative for behavioral problems, confusion, sleep disturbance and suicidal ideas. The patient is not nervous/anxious.     Objective:  BP 128/68 (BP Location: Right Arm, Patient Position: Sitting, Cuff Size: Large)   Pulse (!) 55   Temp 97.7 F (36.5 C) (Oral)   Resp 16   Ht 5\' 6"  (1.676 m)   Wt 254 lb 12 oz (115.6 kg)   SpO2 97%   BMI 41.12 kg/m   BP Readings from Last 3 Encounters:  07/09/16 124/64  07/09/16 128/68  02/06/16 138/73    Wt Readings from Last 3 Encounters:  07/09/16 254 lb 6.4 oz (115.4 kg)  07/09/16 254 lb 12 oz (115.6 kg)  02/06/16 255 lb 12.8 oz (116 kg)    Physical Exam  Constitutional: He is oriented to person, place, and time. No distress.  HENT:  Mouth/Throat: Oropharynx is clear and moist. No oropharyngeal exudate.  Eyes: Conjunctivae are normal. Right eye exhibits no discharge. Left eye exhibits no discharge. No scleral icterus.  Neck: Normal range of motion. Neck supple. No JVD present. No tracheal deviation present. No thyromegaly present.  Cardiovascular: Normal rate, regular rhythm, normal heart sounds and intact distal pulses.  Exam reveals no gallop and no friction rub.   No murmur heard. Pulmonary/Chest: Effort normal and breath sounds normal. No stridor. No respiratory distress. He has no wheezes. He has no rales. He exhibits no tenderness.  Abdominal: Soft. Bowel sounds are normal. He exhibits no distension and no mass. There is no tenderness. There is no rebound and no guarding.  Genitourinary:  Genitourinary Comments: He refused to undress today for a complete physical exam  Musculoskeletal: Normal range of motion.  He exhibits no edema, tenderness or deformity.  Lymphadenopathy:    He has no cervical adenopathy.  Neurological: He is oriented to person, place, and time.  Skin: Skin is warm and dry. No rash noted. He is not diaphoretic. No erythema. No pallor.  Psychiatric: He has a normal mood and affect. His behavior is normal. Thought content normal.  Vitals reviewed.   Lab Results  Component Value Date   WBC 10.1 03/27/2015   HGB 16.1 03/27/2015   HCT 45.8 03/27/2015   PLT 317 03/27/2015   GLUCOSE 121 (H) 07/15/2015   CHOL 191 07/25/2014   TRIG 181 (H) 07/25/2014   HDL 39 (L) 07/25/2014   LDLCALC 116 (H) 07/25/2014  ALT 30 07/25/2014   AST 28 07/25/2014   NA 137 07/15/2015   K 4.8 07/15/2015   CL 100 07/15/2015   CREATININE 1.20 07/15/2015   BUN 22 07/15/2015   CO2 26 07/15/2015   TSH 4.606 (H) 03/27/2015   HGBA1C 6.2 (H) 03/28/2015    Nm Myocar Multi W/spect W/wall Motion / Ef  Result Date: 02/27/2016  No T wave inversion was noted during stress.  There was no ST segment deviation noted during stress.  The study is normal.  This is a low risk study.  The left ventricular ejection fraction is normal (55-65%).  Nuclear stress EF: 61%.  Normal Lexiscan Myoview study. No evidence of ischemia.  Normal LV function with EF of 61%.   Dg Chest Port 1 View  Result Date: 03/27/2015 CLINICAL DATA:  One day history of shortness of breath and chest pain EXAM: PORTABLE CHEST - 1 VIEW COMPARISON:  Chest radiograph August 23, 2010; chest CT August 23, 2010 FINDINGS: There is no edema or consolidation. Heart is upper normal in size with pulmonary vascularity within normal limits. Patient is status post coronary artery bypass grafting. No adenopathy. No pneumothorax. No appreciable bone lesions. IMPRESSION: No edema or consolidation. Electronically Signed   By: Bretta BangWilliam  Woodruff III M.D.   On: 03/27/2015 21:49    Assessment & Plan:   [name] [name] was seen today for annual exam and  hyperlipidemia.  Diagnoses and all orders for this visit:  Routine general medical examination at a health care facility  Elevated TSH- he refuses to do any lab work today to screen for thyroid disease so I discharge him from my practice. -     Thyroid peroxidase antibody; Future -     Thyroid Panel With TSH; Future -     CBC with Differential/Platelet; Future  Hyperglycemia- he is high risk for type 2 diabetes mellitus however he is refusing to do any lab work today, I will therefore discharge him from my practice. -     Hemoglobin A1c; Future -     Comprehensive metabolic panel; Future  BPH without urinary obstruction- he refused an examination of his prostate gland and a repeat PSA testing, will discharge him from our practice. -     PSA; Future  Chronic sinus bradycardia- he tells me this is followed by cardiology.  Hyperlipidemia with target LDL less than 70- he refused to do any lab work today. -     Lipid panel; Future  Need for hepatitis C screening test  Idiopathic gout, unspecified chronicity, unspecified site- as above -     Uric acid; Future  Major depressive disorder with single episode, in full remission (HCC)- we'll continue Celexa -     citalopram (CELEXA) 40 MG tablet; Take 1 tablet (40 mg total) by mouth daily.  Need for prophylactic vaccination and inoculation against influenza -     Flu Vaccine QUAD 36+ mos IM   I have discontinued Mr. Brayman's Coenzyme Q10 (COQ10 PO) and multivitamin. I am also having him maintain his omeprazole, fish oil-omega-3 fatty acids, acetaminophen, allopurinol, metoprolol succinate, irbesartan, diltiazem, atorvastatin, apixaban, chlorthalidone, and citalopram.  Meds ordered this encounter  Medications  . citalopram (CELEXA) 40 MG tablet    Sig: Take 1 tablet (40 mg total) by mouth daily.    Dispense:  90 tablet    Refill:  3    Refill [id number]    See AVS for instructions about healthy living and anticipatory  guidance.  Follow-up: Return in  about 6 months (around 01/07/2017).  Sanda Linger, MD

## 2016-07-09 NOTE — Patient Instructions (Signed)

## 2016-07-09 NOTE — Addendum Note (Signed)
Addended by: York Ram on: 07/09/2016 04:54 PM   Modules accepted: Orders

## 2016-07-09 NOTE — Patient Instructions (Signed)
CPAP is set at 13 cm and seems to be working well CPAP supplies will be renewed for a year We will send in prescription for new CPAP to [facility] pharmacy in [place]

## 2016-07-09 NOTE — Progress Notes (Signed)
   Subjective:    Patient ID: [name] [name], male    DOB: 04-01-1951, 65 y.o.   MRN: [id number]  HPI  Chief Complaint  Patient presents with  . Follow-up    Former [facility] pt, doing well on CPAP, new supplies needed, es:16    65 year old man for follow-up of severe OSA. He was last seen by my partner plan sent 2015 and then lost to follow-up. He is now obtained Medicare and would like to get some new supplies. 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  He reports using CPAP every night, wife has not complained of snoring. Epworth sleepiness score is 9 and he denies daytime naps or daytime somnolence Bedtime is around 11 PM, sleep latency is 15 minutes, he sleeps on his side with one contoured pillow, reports one nocturnal awakening, denies nocturia and is out of bed at 7:30 AM feeling rested without headaches with dryness of mouth. He is gained about 15 pounds in the last 2 years  There is no history suggestive of cataplexy, sleep paralysis or parasomnias  His current machine is about 63 years old and he requests a new machine   Significant tests/ events   PSG 09/2010 RDI 80/h Auto 12/2010:  Optimal cpap 14cm, great compliance  Past Medical History:  Diagnosis Date  . Chronic chest pain   . Chronic sinus bradycardia   . Coronary artery disease    2v CABG, 2004 [facility])  . DJD (degenerative joint disease)   . Edema   . HLD (hyperlipidemia)   . Hypertension   . MI, old 2004  . OA (osteoarthritis)   . Obesity   . Palpitations    Reported history of PVCs  . Sleep apnea    on CPAP  . Spinal stenosis      Review of Systems neg for any significant sore throat, dysphagia, itching, sneezing, nasal congestion or excess/ purulent secretions, fever, chills, sweats, unintended wt loss, pleuritic or exertional cp, hempoptysis, orthopnea pnd or change in chronic leg swelling. Also denies presyncope, palpitations, heartburn, abdominal pain, nausea,  vomiting, diarrhea or change in bowel or urinary habits, dysuria,hematuria, rash, arthralgias, visual complaints, headache, numbness weakness or ataxia.     Objective:   Physical Exam  Gen. Pleasant, obese, in no distress ENT - no lesions, no post nasal drip Neck: No JVD, no thyromegaly, no carotid bruits Lungs: no use of accessory muscles, no dullness to percussion, decreased without rales or rhonchi  Cardiovascular: Rhythm regular, heart sounds  normal, no murmurs or gallops, no peripheral edema Musculoskeletal: No deformities, no cyanosis or clubbing , no tremors       Assessment & Plan:

## 2016-07-09 NOTE — Assessment & Plan Note (Signed)
Cardiovascular benefits of treatment of OSA were discussed

## 2016-07-09 NOTE — Assessment & Plan Note (Signed)
CPAP is set at 13 cm and seems to be working well, no residual events on download CPAP supplies will be renewed for a year We will send in prescription for new CPAP to [facility] pharmacy in [place]  Weight loss encouraged, compliance with goal of at least 4-6 hrs every night is the expectation. Advised against medications with sedative side effects Cautioned against driving when sleepy - understanding that sleepiness will vary on a day to day basis

## 2016-07-09 NOTE — Assessment & Plan Note (Addendum)
He refused all vaccines and lab work today.   The written screening recommendations is given to patient and attached in the patent instructions or AVS.   The patient is here for annual Medicare wellness examination and management of other chronic and acute problems.   The risk factors are reflected in the social history.  The roster of all physicians providing medical care to patient - is listed in the Snapshot section of the chart.  Activities of daily living:  The patient is 100% inedpendent in all ADLs: dressing, toileting, feeding as well as independent mobility  Home safety : The patient has smoke detectors in the home. They wear seatbelts.No firearms at home ( firearms are present in the home, kept in a safe fashion). There is no violence in the home.   There is no risks for hepatitis, STDs or HIV. There is no   history of blood transfusion. They have no travel history to infectious disease endemic areas of the world.  The patient has (has not) seen their dentist in the last six month. They have (not) seen their eye doctor in the last year. They deny (admit to) any hearing difficulty and have not had audiologic testing in the last year.  They do not  have excessive sun exposure. Discussed the need for sun protection: hats, long sleeves and use of sunscreen if there is significant sun exposure.   Diet: the importance of a healthy diet is discussed. They do have a healthy (unhealthy-high fat/fast food) diet.  The patient has a regular exercise program.  The benefits of regular aerobic exercise were discussed.  Depression screen: there are no signs or vegative symptoms of depression- irritability, change in appetite, anhedonia, sadness/tearfullness.  Cognitive assessment: the patient manages all their financial and personal affairs and is actively engaged. They could relate day,date,year and events; recalled 3/3 objects at 3 minutes; performed clock-face test normally.  The following  portions of the patient's history were reviewed and updated as appropriate: allergies, current medications, past family history, past medical history,  past surgical history, past social history  and problem list.  Vision, hearing, body mass index were assessed and reviewed.   During the course of the visit the patient was educated and counseled about appropriate screening and preventive services including : fall prevention , diabetes screening, nutrition counseling, colorectal cancer screening, and recommended immunizations.

## 2016-07-09 NOTE — Progress Notes (Signed)
Pre visit review using our clinic review tool, if applicable. No additional management support is needed unless otherwise documented below in the visit note. 

## 2016-07-12 ENCOUNTER — Encounter: Payer: Self-pay | Admitting: Internal Medicine

## 2016-07-12 DIAGNOSIS — Z91199 Patient's noncompliance with other medical treatment and regimen due to unspecified reason: Secondary | ICD-10-CM | POA: Insufficient documentation

## 2016-07-12 DIAGNOSIS — Z9119 Patient's noncompliance with other medical treatment and regimen: Secondary | ICD-10-CM | POA: Insufficient documentation

## 2016-07-12 NOTE — Assessment & Plan Note (Signed)
He refuses to take any measures to lose weight

## 2016-07-13 ENCOUNTER — Encounter: Payer: Self-pay | Admitting: Internal Medicine

## 2016-07-13 NOTE — Progress Notes (Signed)
done

## 2016-07-14 DIAGNOSIS — G4733 Obstructive sleep apnea (adult) (pediatric): Secondary | ICD-10-CM | POA: Diagnosis not present

## 2016-07-20 ENCOUNTER — Telehealth: Payer: Self-pay | Admitting: Internal Medicine

## 2016-07-20 NOTE — Telephone Encounter (Signed)
Patient dismissed from [facility] by Sanda Linger MD , effective July 13, 2016. Dismissal letter sent out by certified / registered mail. DAJ

## 2016-07-23 ENCOUNTER — Other Ambulatory Visit: Payer: Self-pay | Admitting: *Deleted

## 2016-07-23 MED ORDER — IRBESARTAN 300 MG PO TABS: 300.0000 mg | ORAL_TABLET | Freq: Every day | ORAL | 3 refills | Status: DC

## 2016-07-23 NOTE — Telephone Encounter (Signed)
Patient called about the letter he got. He seem to understand. Patient states he would like to talk to the Dr.Jones about this. He is sorry he wasn't trying to upset Dr.Jones. He just didn't want to do no more blood work done. He states all his other doctors do blood work all the time. He said if you wanted to follow up with him, Please do. Thank you.

## 2016-07-23 NOTE — Telephone Encounter (Signed)
Please advise if needed.

## 2016-07-25 NOTE — Telephone Encounter (Signed)
I have no further advice on this. My dismissal of him stands.

## 2016-08-06 ENCOUNTER — Telehealth: Payer: Self-pay | Admitting: Internal Medicine

## 2016-08-06 NOTE — Telephone Encounter (Signed)
Received signed domestic return receipt verifying delivery of certified Dismissal Letter on July 23, 2016. Article number [id number] DAJ

## 2016-08-10 ENCOUNTER — Telehealth: Payer: Self-pay | Admitting: Cardiology

## 2016-08-10 NOTE — Telephone Encounter (Signed)
Pt says Friday had "an episode of a-fib with chest pain" lasting about an hour. Says this was worse episode he has had - has noticed some episodes but not lasted as long - denies SOB/dizziness. Pt also c/o weakness and fatigue - Will forward to [name]

## 2016-08-10 NOTE — Telephone Encounter (Signed)
PATIENT has question about medication

## 2016-08-12 NOTE — Telephone Encounter (Signed)
No available slots here or [place] with extender - do you want pt to come in for EKG nurse visit?

## 2016-08-12 NOTE — Telephone Encounter (Signed)
Can we get him in to see the PA this week in [place] or add on to [facility] DOD if there is room   Dominga Ferry MD

## 2016-08-13 NOTE — Telephone Encounter (Signed)
Pt made aware and scheduled for 11/16 with Herma Carson, PA

## 2016-08-13 NOTE — Telephone Encounter (Signed)
Is there anything next week we can get him?   Dominga Ferry MD

## 2016-08-14 DIAGNOSIS — G4733 Obstructive sleep apnea (adult) (pediatric): Secondary | ICD-10-CM | POA: Diagnosis not present

## 2016-08-18 ENCOUNTER — Encounter: Payer: Self-pay | Admitting: *Deleted

## 2016-08-18 DIAGNOSIS — L821 Other seborrheic keratosis: Secondary | ICD-10-CM | POA: Diagnosis not present

## 2016-08-18 DIAGNOSIS — L820 Inflamed seborrheic keratosis: Secondary | ICD-10-CM | POA: Diagnosis not present

## 2016-08-18 DIAGNOSIS — L570 Actinic keratosis: Secondary | ICD-10-CM | POA: Diagnosis not present

## 2016-08-18 DIAGNOSIS — Z85828 Personal history of other malignant neoplasm of skin: Secondary | ICD-10-CM | POA: Diagnosis not present

## 2016-08-18 DIAGNOSIS — L905 Scar conditions and fibrosis of skin: Secondary | ICD-10-CM | POA: Diagnosis not present

## 2016-08-19 ENCOUNTER — Inpatient Hospital Stay (HOSPITAL_COMMUNITY)
Admission: EM | Admit: 2016-08-19 | Discharge: 2016-08-22 | DRG: 310 | Disposition: A | Discharge status: Home / Self Care | Payer: Medicare HMO | Attending: Internal Medicine | Admitting: Internal Medicine

## 2016-08-19 ENCOUNTER — Emergency Department (HOSPITAL_COMMUNITY): Payer: Medicare HMO

## 2016-08-19 ENCOUNTER — Encounter (HOSPITAL_COMMUNITY): Payer: Self-pay | Admitting: *Deleted

## 2016-08-19 DIAGNOSIS — Z79899 Other long term (current) drug therapy: Secondary | ICD-10-CM

## 2016-08-19 DIAGNOSIS — Z6839 Body mass index (BMI) 39.0-39.9, adult: Secondary | ICD-10-CM

## 2016-08-19 DIAGNOSIS — R002 Palpitations: Secondary | ICD-10-CM | POA: Diagnosis present

## 2016-08-19 DIAGNOSIS — Z7901 Long term (current) use of anticoagulants: Secondary | ICD-10-CM | POA: Diagnosis not present

## 2016-08-19 DIAGNOSIS — Z951 Presence of aortocoronary bypass graft: Secondary | ICD-10-CM | POA: Diagnosis not present

## 2016-08-19 DIAGNOSIS — R079 Chest pain, unspecified: Secondary | ICD-10-CM | POA: Diagnosis not present

## 2016-08-19 DIAGNOSIS — Z8249 Family history of ischemic heart disease and other diseases of the circulatory system: Secondary | ICD-10-CM

## 2016-08-19 DIAGNOSIS — E785 Hyperlipidemia, unspecified: Secondary | ICD-10-CM | POA: Diagnosis present

## 2016-08-19 DIAGNOSIS — I480 Paroxysmal atrial fibrillation: Principal | ICD-10-CM | POA: Diagnosis present

## 2016-08-19 DIAGNOSIS — Z5181 Encounter for therapeutic drug level monitoring: Secondary | ICD-10-CM

## 2016-08-19 DIAGNOSIS — I4891 Unspecified atrial fibrillation: Secondary | ICD-10-CM

## 2016-08-19 DIAGNOSIS — I2510 Atherosclerotic heart disease of native coronary artery without angina pectoris: Secondary | ICD-10-CM | POA: Diagnosis not present

## 2016-08-19 DIAGNOSIS — R001 Bradycardia, unspecified: Secondary | ICD-10-CM | POA: Diagnosis present

## 2016-08-19 DIAGNOSIS — Z823 Family history of stroke: Secondary | ICD-10-CM

## 2016-08-19 DIAGNOSIS — G4733 Obstructive sleep apnea (adult) (pediatric): Secondary | ICD-10-CM

## 2016-08-19 DIAGNOSIS — R69 Illness, unspecified: Secondary | ICD-10-CM | POA: Diagnosis not present

## 2016-08-19 DIAGNOSIS — I10 Essential (primary) hypertension: Secondary | ICD-10-CM | POA: Diagnosis not present

## 2016-08-19 DIAGNOSIS — F419 Anxiety disorder, unspecified: Secondary | ICD-10-CM | POA: Diagnosis present

## 2016-08-19 DIAGNOSIS — Z9989 Dependence on other enabling machines and devices: Secondary | ICD-10-CM

## 2016-08-19 DIAGNOSIS — R0602 Shortness of breath: Secondary | ICD-10-CM | POA: Diagnosis not present

## 2016-08-19 DIAGNOSIS — E669 Obesity, unspecified: Secondary | ICD-10-CM | POA: Diagnosis present

## 2016-08-19 DIAGNOSIS — I252 Old myocardial infarction: Secondary | ICD-10-CM

## 2016-08-19 DIAGNOSIS — F411 Generalized anxiety disorder: Secondary | ICD-10-CM

## 2016-08-19 DIAGNOSIS — R42 Dizziness and giddiness: Secondary | ICD-10-CM | POA: Diagnosis not present

## 2016-08-19 HISTORY — DX: Paroxysmal atrial fibrillation: I48.0

## 2016-08-19 HISTORY — DX: Anxiety disorder, unspecified: F41.9

## 2016-08-19 LAB — I-STAT TROPONIN, ED
TROPONIN I, POC: 0.01 ng/mL (ref 0.00–0.08)
Troponin i, poc: 0.01 ng/mL (ref 0.00–0.08)

## 2016-08-19 LAB — CBC
HEMATOCRIT: 45.5 % (ref 39.0–52.0)
Hemoglobin: 15.7 g/dL (ref 13.0–17.0)
MCH: 29.1 pg (ref 26.0–34.0)
MCHC: 34.5 g/dL (ref 30.0–36.0)
MCV: 84.3 fL (ref 78.0–100.0)
Platelets: 295 10*3/uL (ref 150–400)
RBC: 5.4 MIL/uL (ref 4.22–5.81)
RDW: 13.8 % (ref 11.5–15.5)
WBC: 11 10*3/uL — AB (ref 4.0–10.5)

## 2016-08-19 LAB — BASIC METABOLIC PANEL
ANION GAP: 12 (ref 5–15)
BUN: 19 mg/dL (ref 6–20)
CALCIUM: 10.1 mg/dL (ref 8.9–10.3)
CO2: 21 mmol/L — ABNORMAL LOW (ref 22–32)
Chloride: 104 mmol/L (ref 101–111)
Creatinine, Ser: 1.16 mg/dL (ref 0.61–1.24)
GFR calc Af Amer: >60 mL/min (ref >60)
Glucose, Bld: 106 mg/dL — ABNORMAL HIGH (ref 65–99)
POTASSIUM: 4 mmol/L (ref 3.5–5.1)
SODIUM: 137 mmol/L (ref 135–145)

## 2016-08-19 LAB — MAGNESIUM: Magnesium: 2.1 mg/dL (ref 1.7–2.4)

## 2016-08-19 LAB — TSH: TSH: 3.869 u[IU]/mL (ref 0.350–4.500)

## 2016-08-19 LAB — BRAIN NATRIURETIC PEPTIDE: B Natriuretic Peptide: 166.9 pg/mL — ABNORMAL HIGH (ref 0.0–100.0)

## 2016-08-19 MED ORDER — PANTOPRAZOLE SODIUM 40 MG PO TBEC
40.0000 mg | DELAYED_RELEASE_TABLET | Freq: Every day | ORAL | Status: DC
  Administered 2016-08-20 – 2016-08-22 (×3): 40 mg via ORAL
  Filled 2016-08-19 (×3): qty 1

## 2016-08-19 MED ORDER — ONDANSETRON HCL 4 MG/2ML IJ SOLN: 4.0000 mg | Freq: Four times a day (QID) | INTRAMUSCULAR | Status: DC | PRN

## 2016-08-19 MED ORDER — CHLORTHALIDONE 25 MG PO TABS
25.0000 mg | ORAL_TABLET | Freq: Every day | ORAL | Status: DC
  Administered 2016-08-20 – 2016-08-22 (×3): 25 mg via ORAL
  Filled 2016-08-19 (×3): qty 1

## 2016-08-19 MED ORDER — APIXABAN 5 MG PO TABS
5.0000 mg | ORAL_TABLET | Freq: Two times a day (BID) | ORAL | Status: DC
  Administered 2016-08-19 – 2016-08-22 (×6): 5 mg via ORAL
  Filled 2016-08-19 (×6): qty 1

## 2016-08-19 MED ORDER — ACETAMINOPHEN ER 650 MG PO TBCR: 650.0000 mg | EXTENDED_RELEASE_TABLET | Freq: Three times a day (TID) | ORAL | Status: DC

## 2016-08-19 MED ORDER — ACETAMINOPHEN 325 MG PO TABS
650.0000 mg | ORAL_TABLET | ORAL | Status: DC | PRN
  Administered 2016-08-19: 650 mg via ORAL
  Filled 2016-08-19: qty 2

## 2016-08-19 MED ORDER — HYDROMORPHONE HCL 2 MG/ML IJ SOLN: 1.0000 mg | Freq: Once | INTRAMUSCULAR | Status: DC

## 2016-08-19 MED ORDER — SOTALOL HCL 80 MG PO TABS
80.0000 mg | ORAL_TABLET | Freq: Two times a day (BID) | ORAL | Status: DC
  Administered 2016-08-19 – 2016-08-22 (×6): 80 mg via ORAL
  Filled 2016-08-19 (×6): qty 1

## 2016-08-19 MED ORDER — IRBESARTAN 300 MG PO TABS
300.0000 mg | ORAL_TABLET | Freq: Every day | ORAL | Status: DC
  Administered 2016-08-19 – 2016-08-21 (×3): 300 mg via ORAL
  Filled 2016-08-19: qty 1
  Filled 2016-08-19 (×2): qty 2
  Filled 2016-08-19 (×2): qty 1

## 2016-08-19 MED ORDER — CITALOPRAM HYDROBROMIDE 20 MG PO TABS
40.0000 mg | ORAL_TABLET | Freq: Every day | ORAL | Status: DC
  Administered 2016-08-20: 40 mg via ORAL
  Filled 2016-08-19: qty 2

## 2016-08-19 MED ORDER — ALLOPURINOL 100 MG PO TABS
100.0000 mg | ORAL_TABLET | Freq: Every day | ORAL | Status: DC | PRN
Start: 2016-08-19 — End: 2016-08-19

## 2016-08-19 MED ORDER — DILTIAZEM HCL ER COATED BEADS 240 MG PO CP24
240.0000 mg | ORAL_CAPSULE | Freq: Every day | ORAL | Status: DC
  Administered 2016-08-20: 240 mg via ORAL
  Filled 2016-08-19: qty 1

## 2016-08-19 MED ORDER — SODIUM CHLORIDE 0.9 % IV BOLUS (SEPSIS): 1000.0000 mL | Freq: Once | INTRAVENOUS | Status: DC

## 2016-08-19 MED ORDER — ATORVASTATIN CALCIUM 20 MG PO TABS
20.0000 mg | ORAL_TABLET | Freq: Every day | ORAL | Status: DC
  Administered 2016-08-19: 20 mg via ORAL
  Filled 2016-08-19 (×2): qty 1

## 2016-08-19 NOTE — ED Notes (Signed)
ED Provider at bedside. 

## 2016-08-19 NOTE — ED Notes (Signed)
Repeat EKG given to MD

## 2016-08-19 NOTE — ED Provider Notes (Signed)
[facility]-EMERGENCY DEPT Provider Note   CSN: [id number] Arrival date & time: 08/19/16  1027     History   Chief Complaint Chief Complaint  Patient presents with  . Atrial Fibrillation  . Shortness of Breath    HPI [name] is a 65 y.o. male with a past medical history significant for coronary artery disease status post CABG, atrial fibrillation on Eliquis and diltiazem, hypertension, and hyperlipidemia who presents with fatigue, palpitations, shortness of breath, chest pain, and sensation of tachycardia. Patient is committed by spouse. They report that patient has been "feeling off" for the last week. They say that in the last 3 weeks, patient has had 3 episodes of palpitations, chest pain, and tachycardia. He says that he went to the gym today and, after riding on his bike, fell his heart rate increased. He says normally he runs in the 40s to 70s but measured his heart rate in the 160s. He says that he was very diaphoretic, short of breath, and experienced a tight and pressure-like chest pain in his central chest. He describes the pain as 8-9 out of 10 severity however, it has improved and is now a out of 10 in severity. On arrival, patient was tachycardic in the 130 and in atrial fibrillation.   Patient denies recent fevers, chills, congestion, cough, nausea, vomiting, constipation,, diarrhea, or dysuria. He does report worsened fatigue for the last few weeks.  Due to the patient's history of MI, CABG, and report of chest pain associated with diaphoresis, lightheadedness, and shortness of breath patient came to the ED for evaluation.   The history is provided by the patient, the spouse and medical records. No language interpreter was used.  Palpitations   This is a recurrent problem. The current episode started 3 to 5 hours ago. The problem occurs constantly. The problem has been gradually improving. The problem is associated with exercise. On average, each episode lasts 3 hours.  Associated symptoms include diaphoresis, chest pain, chest pressure, exertional chest pressure and shortness of breath. Pertinent negatives include no fever, no syncope, no abdominal pain, no nausea, no vomiting, no headaches, no back pain, no lower extremity edema, no dizziness, no weakness and no cough. He has tried nothing for the symptoms. The treatment provided no relief. Risk factors include obesity. His past medical history is significant for heart disease.    Past Medical History:  Diagnosis Date  . Atrial fibrillation (HCC)   . Chronic chest pain   . Chronic sinus bradycardia   . Coronary artery disease    2v CABG, 2004 [facility])  . DJD (degenerative joint disease)   . Edema   . HLD (hyperlipidemia)   . Hypertension   . MI, old 2004  . OA (osteoarthritis)   . Obesity   . Palpitations    Reported history of PVCs  . Sleep apnea    on CPAP  . Spinal stenosis     Patient Active Problem List   Diagnosis Date Noted  . Morbidly obese (HCC) 07/12/2016  . Medical non-compliance 07/12/2016  . Elevated TSH 07/09/2016  . BPH without urinary obstruction 07/09/2016  . Need for hepatitis C screening test 07/09/2016  . Idiopathic gout 07/09/2016  . Hyperglycemia 08/01/2015  . Atrial fibrillation with RVR (HCC) 03/28/2015  . Routine general medical examination at a health care facility 08/11/2013  . Chronic sinus bradycardia   . Obstructive sleep apnea 09/12/2010  . MYOCARDIAL INFARCTION 09/11/2010  . Spinal stenosis, unspecified region other than cervical 09/11/2010  .  Hyperlipidemia with target LDL less than 70 09/08/2010  . Depression 09/08/2010  . Essential hypertension 09/08/2010  . Coronary atherosclerosis 09/08/2010  . Congestive heart failure (HCC) 09/08/2010  . GERD 09/08/2010    Past Surgical History:  Procedure Laterality Date  . BACK SURGERY  2011  . CARPAL TUNNEL RELEASE     bilateral  . CORONARY ARTERY BYPASS GRAFT     2004  . fistula surgery    .  hemorrhoidectomy    . LUMBAR LAMINECTOMY    . right foot fracture    . TRANSTHORACIC ECHOCARDIOGRAM  08/25/2010   EF 55-60%       Home Medications    Prior to Admission medications   Medication Sig Start Date End Date Taking? Authorizing Provider  acetaminophen (TYLENOL) 650 MG CR tablet Take 650 mg by mouth 3 (three) times a week.    Historical Provider, MD  allopurinol (ZYLOPRIM) 100 MG tablet Take 100 mg by mouth daily as needed.    Historical Provider, MD  apixaban (ELIQUIS) 5 MG TABS tablet Take 1 tablet (5 mg total) by mouth 2 (two) times daily. 03/11/16   Antoine PocheJonathan F Branch, MD  atorvastatin (LIPITOR) 20 MG tablet Take 1 tablet (20 mg total) by mouth daily. 02/06/16   Antoine PocheJonathan F Branch, MD  chlorthalidone (HYGROTON) 25 MG tablet Take 1 tablet (25 mg total) by mouth daily. 06/01/16   Antoine PocheJonathan F Branch, MD  citalopram (CELEXA) 40 MG tablet Take 1 tablet (40 mg total) by mouth daily. 07/09/16   Etta Grandchildhomas L Jones, MD  diltiazem (CARDIZEM CD) 240 MG 24 hr capsule Take 1 capsule (240 mg total) by mouth daily. 02/06/16   Antoine PocheJonathan F Branch, MD  fish oil-omega-3 fatty acids 1000 MG capsule Take 3 g by mouth 2 (two) times daily.     Historical Provider, MD  irbesartan (AVAPRO) 300 MG tablet Take 1 tablet (300 mg total) by mouth at bedtime. 07/23/16 07/23/17  Antoine PocheJonathan F Branch, MD  metoprolol succinate (TOPROL-XL) 50 MG 24 hr tablet Take 1 tablet (50 mg total) by mouth 2 (two) times daily. 02/06/16   Antoine PocheJonathan F Branch, MD  omeprazole (PRILOSEC) 20 MG capsule Take 20 mg by mouth every other day.     Historical Provider, MD    Family History Family History  Problem Relation Age of Onset  . Breast cancer Mother   . Hypertension Mother   . Coronary artery disease Father   . Stroke Father   . Hypertension Father     Social History Social History  Substance Use Topics  . Smoking status: Never Smoker  . Smokeless tobacco: Never Used  . Alcohol use No     Allergies   Nsaids and  Vancomycin   Review of Systems Review of Systems  Constitutional: Positive for diaphoresis and fatigue. Negative for activity change, chills and fever.  HENT: Negative for congestion and rhinorrhea.   Eyes: Negative for visual disturbance.  Respiratory: Positive for chest tightness and shortness of breath. Negative for cough, wheezing and stridor.   Cardiovascular: Positive for chest pain and palpitations. Negative for leg swelling and syncope.  Gastrointestinal: Negative for abdominal distention, abdominal pain, blood in stool, constipation, diarrhea, nausea and vomiting.  Genitourinary: Negative for difficulty urinating, dysuria and flank pain.  Musculoskeletal: Negative for back pain and gait problem.  Skin: Negative for rash and wound.  Neurological: Positive for light-headedness. Negative for dizziness, weakness and headaches.  Psychiatric/Behavioral: Negative for agitation.  All other systems reviewed and are negative.  Physical Exam Updated Vital Signs BP 128/83   Pulse 92   Temp 98.3 F (36.8 C) (Oral)   Resp 12   SpO2 95%   Physical Exam  Constitutional: He is oriented to person, place, and time. He appears well-developed and well-nourished.  HENT:  Head: Normocephalic and atraumatic.  Mouth/Throat: Oropharynx is clear and moist. No oropharyngeal exudate.  Eyes: Conjunctivae are normal.  Neck: Neck supple.  Cardiovascular: Intact distal pulses.  An irregular rhythm present. Tachycardia present.  Exam reveals no gallop.   Murmur heard. Pulmonary/Chest: Effort normal and breath sounds normal. No respiratory distress. He has no wheezes. He has no rales. He exhibits no tenderness.  Abdominal: Soft. There is no tenderness.  Musculoskeletal: He exhibits no edema or tenderness.  Neurological: He is alert and oriented to person, place, and time. He displays normal reflexes. No cranial nerve deficit or sensory deficit. He exhibits normal muscle tone. Coordination normal.   Skin: Skin is warm and dry. Capillary refill takes less than 2 seconds. No erythema. No pallor.  Psychiatric: He has a normal mood and affect.  Nursing note and vitals reviewed.    ED Treatments / Results  Labs (all labs ordered are listed, but only abnormal results are displayed) Labs Reviewed  BASIC METABOLIC PANEL - Abnormal; Notable for the following:       Result Value   CO2 21 (*)    Glucose, Bld 106 (*)    All other components within normal limits  CBC - Abnormal; Notable for the following:    WBC 11.0 (*)    All other components within normal limits  BRAIN NATRIURETIC PEPTIDE - Abnormal; Notable for the following:    B Natriuretic Peptide 166.9 (*)    All other components within normal limits  MAGNESIUM  TSH  BASIC METABOLIC PANEL  CBC  I-STAT TROPOININ, ED  I-STAT TROPOININ, ED    EKG  EKG Interpretation  Date/Time:  Wednesday August 19 2016 10:30:01 EST Ventricular Rate:  100 PR Interval:    QRS Duration: 98 QT Interval:  360 QTC Calculation: 464 R Axis:   49 Text Interpretation:  Atrial fibrillation Inferior-posterior infarct , age undetermined Abnormal ECG When compared to prior, rate has slowed.  Abnormal ECG Confirmed by Rush Landmark MD, Calani Gick [phone]) on 08/19/2016 11:49:43 AM       Radiology Dg Chest 2 View  Result Date: 08/19/2016 CLINICAL DATA:  Chest pain, shortness of breath. EXAM: CHEST  2 VIEW COMPARISON:  Radiograph of March 27, 2015. FINDINGS: Stable cardiomediastinal silhouette. Status post coronary artery bypass graft. Atherosclerosis of thoracic aorta is noted. No pneumothorax or pleural effusion is noted. Mild bibasilar subsegmental atelectasis is noted. Bony thorax is unremarkable. IMPRESSION: Aortic atherosclerosis.  Mild bibasilar subsegmental atelectasis. Electronically Signed   By: Lupita Raider, M.D.   On: 08/19/2016 11:56    Procedures Procedures (including critical care time)  Medications Ordered in ED Medications   acetaminophen (TYLENOL) tablet 650 mg (not administered)  ondansetron (ZOFRAN) injection 4 mg (not administered)  sotalol (BETAPACE) tablet 80 mg (not administered)  apixaban (ELIQUIS) tablet 5 mg (not administered)  atorvastatin (LIPITOR) tablet 20 mg (not administered)  chlorthalidone (HYGROTON) tablet 25 mg (not administered)  citalopram (CELEXA) tablet 40 mg (40 mg Oral Not Given 08/19/16 1915)  diltiazem (CARDIZEM CD) 24 hr capsule 240 mg (240 mg Oral Not Given 08/19/16 1915)  irbesartan (AVAPRO) tablet 300 mg (not administered)  pantoprazole (PROTONIX) EC tablet 40 mg (40 mg Oral Not Given 08/19/16  1915)     Initial Impression / Assessment and Plan / ED Course  I have reviewed the triage vital signs and the nursing notes.  Pertinent labs & imaging results that were available during my care of the patient were reviewed by me and considered in my medical decision making (see chart for details).  Clinical Course     [name] [name] is a 64 y.o. male with a past medical history significant for coronary artery disease status post CABG, atrial fibrillation on Eliquis and diltiazem, hypertension, and hyperlipidemia who presents with fatigue, palpitations, shortness of breath, chest pain, and sensation of tachycardia.  History and exam are seen above.   In triage, EKG was performed and showed atrial fibrillation with a tachycardic rate. On initial evaluation, patient appeared in sinus rhythm on the monitor. EKG was reviewed. And patient was in sinus rhythm. On exam, patient had clear lungs, chest nontender, abdomen nontender. Patient does not appear diaphoretic. Patient had unremarkable neurologic exam. Patient had no significant lower extremity edema.   Based on patient's history of MI, description of his chest pain as crushing, associated with diaphoresis, palpitations, tachycardia, and shortness of breath, feel patient needs workup for chest pain with laboratory testing and chest x-ray.    Chest x-ray showed some segmental atelectasis but no evidence of pneumonia or other significant abnormality. B NP slightly elevated at 166. Initial troponin negative. CBC revealed mild leukocytosis of 11.0. No anemia. Metabolic panel showed slight decrease in CO2 of 21. Kidney function within normal limits and IBC was normal.   Given patient's high risk chest pain and history of MI, cardiology was called for further management recommendations. Anticipate following up on their recommendations. Pt said his pain has improved and he remains in sinus rhythm while awaiting cardiology eval.  Patient will be admitted for further management.   Final Clinical Impressions(s) / ED Diagnoses   Final diagnoses:  Atrial fibrillation with RVR (HCC)     Clinical Impression: 1. Atrial fibrillation with RVR (HCC)     Disposition: Admit to Internal medicine.    Heide Scales, MD 08/19/16 [phone]

## 2016-08-19 NOTE — ED Notes (Signed)
Patient transported to X-ray 

## 2016-08-19 NOTE — Consult Note (Signed)
Patient ID: [name] SimmondsDanny R [name] MRN: [id number], DOB/AGE: 1950/11/09   Admit date: 08/19/2016   Reason for Consult: Chest Pain Requesting MD: Dr. Rush Landmarkegeler, [facility]   Primary Physician: Sanda Lingerhomas Jones, MD Primary Cardiologist: Dr. Wyline MoodBranch   Pt. Profile:  65 year old obese male with a history of CAD status post 2 vessel CAB[facility] in [facility] at [facility] with low risk nuclear stress test in 2016, paroxysmal atrial fibrillation on rate control therapy with Metroprolol and Cardizem as well as anticoagulation therapy with Eliquis, obstructive sleep apnea fully compliant with CPAP, HTN and hyperlipidemia presenting to the ED with complaint of chest pain and tachypalpitations. Found to be in atrial fibrillation with RVR. He spontaneously converted to normal sinus rhythm in the ED.  Problem List  Past Medical History:  Diagnosis Date  . Atrial fibrillation (HCC)   . Chronic chest pain   . Chronic sinus bradycardia   . Coronary artery disease    2v CABG, 2004 [facility])  . DJD (degenerative joint disease)   . Edema   . HLD (hyperlipidemia)   . Hypertension   . MI, old 2004  . OA (osteoarthritis)   . Obesity   . Palpitations    Reported history of PVCs  . Sleep apnea    on CPAP  . Spinal stenosis     Past Surgical History:  Procedure Laterality Date  . BACK SURGERY  2011  . CARPAL TUNNEL RELEASE     bilateral  . CORONARY ARTERY BYPASS GRAFT     2004  . fistula surgery    . hemorrhoidectomy    . LUMBAR LAMINECTOMY    . right foot fracture    . TRANSTHORACIC ECHOCARDIOGRAM  08/25/2010   EF 55-60%     Allergies  Allergies  Allergen Reactions  . Nsaids Other (See Comments)    On blood thinner  . Vancomycin Other (See Comments)    Was informed after heart surgery ? Breathing problem/patient unsure    HPI  65 y/o male, followed by Dr. Wyline MoodBranch, with a h/o CAD s/p prior 2 vessel CAB[facility]. Apparently had follow up cath in 2006 in [place] with patent  grafts. Reports negative stress test in 2011 by Dr Hyacinth MeekerMiller in [place]. In 03/2015, he had a Lexiscan NST that was negative for ischemia.  EF was 61%. 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 He also has a h/o paroxsymal atrial fibrillation on Eliquis, OSA compliant with CPAP, HTN and HLD. Last seen by Dr. Wyline MoodBranch 02/06/16 and was stable. He was instructed to f/u in 1 year.   [facility] with a complaint of chest pain and tachypalpitations. He can tell whenever he goes in atrial fibrillation. He states that he has been troubled by multiple recurrent episodes of paroxysmal atrial fibrillation recently. He feels his heart racing when this happens. He gets slightly diaphoretic and mildly short of breath but denies any syncope/near-syncope. He reports full medication compliance. He is on rate control strategy consisting of Cardizem and Metroprolol. He's been fully compliant with Eilauis. No missed doses. He also states that he has been fully compliant with CPAP therapy every night. He does not refrain from caffeine. He drinks diet [facility] regularly. Occasionally will have a beer or 2. No recent fever, chills, nausea, vomiting or diarrhea.   Today when his episode occurred he reports his heart rate was in the 160s. He felt poorly with this. Subsequently,  he came into the emergency department. He was noted to be in atrial fib with a rapid ventricular response. This was a short episode that spontaneously resolved without intervention. Telemetry currently shows sinus rhythm. His heart rate is back to his baseline in the 50s. Palpitations resolved. He still has mild chest discomfort. It feels like a soreness in his chest. This is different from the anginal symptoms he had prior to undergoing bypass surgery  In 2004 which felt like heavy pressure. He reports that he is active. He works out at [employer] several days a week. He is able to exercise on the stationary bike 30 minutes  at a time without any exertional chest pain or dyspnea. He reports that he is frustrated given the recurrence rate of his symptoms. He is open to pursue other therapies to better control his arrhythmia.  Home Medications  Prior to Admission medications   Medication Sig Start Date End Date Taking? Authorizing Provider  acetaminophen (TYLENOL) 650 MG CR tablet Take 650 mg by mouth every 8 (eight) hours as needed for pain.    Yes Historical Provider, MD  allopurinol (ZYLOPRIM) 100 MG tablet Take 100 mg by mouth daily as needed (gout).    Yes Historical Provider, MD  apixaban (ELIQUIS) 5 MG TABS tablet Take 1 tablet (5 mg total) by mouth 2 (two) times daily. 03/11/16  Yes Antoine Poche, MD  atorvastatin (LIPITOR) 20 MG tablet Take 1 tablet (20 mg total) by mouth daily. 02/06/16  Yes Antoine Poche, MD  chlorthalidone (HYGROTON) 25 MG tablet Take 1 tablet (25 mg total) by mouth daily. 06/01/16  Yes Antoine Poche, MD  citalopram (CELEXA) 40 MG tablet Take 1 tablet (40 mg total) by mouth daily. 07/09/16  Yes Etta Grandchild, MD  Coenzyme Q10 (CO Q 10) 100 MG CAPS Take 400 mg by mouth daily.   Yes Historical Provider, MD  diltiazem (CARDIZEM CD) 240 MG 24 hr capsule Take 1 capsule (240 mg total) by mouth daily. 02/06/16  Yes Antoine Poche, MD  fish oil-omega-3 fatty acids 1000 MG capsule Take 3 g by mouth 2 (two) times daily.    Yes Historical Provider, MD  irbesartan (AVAPRO) 300 MG tablet Take 1 tablet (300 mg total) by mouth at bedtime. 07/23/16 07/23/17 Yes Antoine Poche, MD  metoprolol succinate (TOPROL-XL) 50 MG 24 hr tablet Take 1 tablet (50 mg total) by mouth 2 (two) times daily. 02/06/16  Yes Antoine Poche, MD  Multiple Vitamin (MULTIVITAMIN) tablet Take 1 tablet by mouth daily.   Yes Historical Provider, MD  omeprazole (PRILOSEC) 20 MG capsule Take 20 mg by mouth every other day.    Yes Historical Provider, MD    Family History  Family History  Problem Relation Age of Onset  .  Breast cancer Mother   . Hypertension Mother   . Coronary artery disease Father   . Stroke Father   . Hypertension Father     Social History  Social History   Social History  . Marital status: Married    Spouse name: [name]  . Number of children: 0  . 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 Smoking status: Never Smoker  . Smokeless tobacco: Never Used  . Alcohol use No  . Drug use: No  . Sexual activity: Yes   Other Topics Concern  . Not on file   Social History  Narrative  . No narrative on file     Review of Systems General:  No chills, fever, night sweats or weight changes.  Cardiovascular:  + chest pain, dyspnea on exertion, edema, orthopnea, palpitations, paroxysmal nocturnal dyspnea. Dermatological: No rash, lesions/masses Respiratory: No cough, dyspnea Urologic: No hematuria, dysuria Abdominal:   No nausea, vomiting, diarrhea, bright red blood per rectum, melena, or hematemesis Neurologic:  No visual changes, wkns, changes in mental status. All other systems reviewed and are otherwise negative except as noted above.  Physical Exam  Blood pressure 119/66, pulse (!) 50, temperature 97.4 F (36.3 C), temperature source Oral, resp. rate 12, SpO2 94 %.  General: Pleasant, NAD, obese  Psych: Normal affect. Neuro: Alert and oriented X 3. Moves all extremities spontaneously. HEENT: Normal  Neck: Supple without bruits or JVD. Lungs:  Resp regular and unlabored, CTA. Heart: RRR no s3, s4, or murmurs. Abdomen: Soft, non-tender, non-distended, BS + x 4.  Extremities: No clubbing, cyanosis or edema. DP/PT/Radials 2+ and equal bilaterally.  Labs  Troponin (Point of Care Test)  Recent Labs  08/19/16 1500  TROPIPOC 0.01   No results for input(s): CKTOTAL, CKMB, TROPONINI in the last 72 hours. Lab Results  Component Value Date   WBC 11.0 (H) 08/19/2016   HGB 15.7 08/19/2016   HCT  45.5 08/19/2016   MCV 84.3 08/19/2016   PLT 295 08/19/2016    Recent Labs Lab 08/19/16 1037  NA 137  K 4.0  CL 104  CO2 21*  BUN 19  CREATININE 1.16  CALCIUM 10.1  GLUCOSE 106*   Lab Results  Component Value Date   CHOL 191 07/25/2014   HDL 39 (L) 07/25/2014   LDLCALC 116 (H) 07/25/2014   TRIG 181 (H) 07/25/2014   Lab Results  Component Value Date   DDIMER (H) 08/23/2010    0.51        AT THE INHOUSE ESTABLISHED CUTOFF VALUE OF 0.48 ug/mL FEU, THIS ASSAY HAS BEEN DOCUMENTED IN THE LITERATURE TO HAVE A SENSITIVITY AND NEGATIVE PREDICTIVE VALUE OF AT LEAST 98 TO 99%.  THE TEST RESULT SHOULD BE CORRELATED WITH AN ASSESSMENT OF THE CLINICAL PROBABILITY OF DVT / VTE.     Radiology/Studies  Dg Chest 2 View  Result Date: 08/19/2016 CLINICAL DATA:  Chest pain, shortness of breath. EXAM: CHEST  2 VIEW COMPARISON:  Radiograph of March 27, 2015. FINDINGS: Stable cardiomediastinal silhouette. Status post coronary artery bypass graft. Atherosclerosis of thoracic aorta is noted. No pneumothorax or pleural effusion is noted. Mild bibasilar subsegmental atelectasis is noted. Bony thorax is unremarkable. IMPRESSION: Aortic atherosclerosis.  Mild bibasilar subsegmental atelectasis. Electronically Signed   By: Lupita Raider, M.D.   On: 08/19/2016 11:56    ECG  Sinus brady. 52 bpm   ASSESSMENT AND PLAN  1. Paroxysmal atrial fibrillation: Pt with a history of atrial fibrillation on rate control strategy with Metroprolol and Cardizem. Also on Eliquis for anticoagulation therapy. He has had frequent episodes of recurrent tachypalpitations and has been symptomatic with this. He initially presented to the ED with A. fib with RVR but spontaneously converted back to normal sinus rhythm. Telemetry currently shows sinus bradycardia with a rate in the 50s. K is WNL. Renal function stable. WBC slightly abnormal at 11.0. However he is afebrile. CXR unremarkable. POC troponin is negative. BNP  slightly abnormal at 166. There is little room to increase rate control agents given baseline HR in the 50s. Given he is in NSR now, we will d/c  from the ED and arrange f/u in the afib clinic. He will likely need an antiarrythmic. Continue Eliquis.    Signed, Robbie Lis, PA-C 08/19/2016, 4:07 PM

## 2016-08-19 NOTE — ED Notes (Signed)
This RN attempted IV was able to get blood for labs. Unsuccessful IV.

## 2016-08-19 NOTE — ED Triage Notes (Signed)
Pt reports hx of afib with controlled rate. Pt woke up this am with elevated HR and BP. Having palpitations and sob. ekg done on arrival. afib rvr rate 100-150 noted at triage.

## 2016-08-20 ENCOUNTER — Ambulatory Visit: Payer: Medicare HMO | Admitting: Physician Assistant

## 2016-08-20 DIAGNOSIS — Z5181 Encounter for therapeutic drug level monitoring: Secondary | ICD-10-CM

## 2016-08-20 DIAGNOSIS — R69 Illness, unspecified: Secondary | ICD-10-CM | POA: Diagnosis not present

## 2016-08-20 DIAGNOSIS — G4733 Obstructive sleep apnea (adult) (pediatric): Secondary | ICD-10-CM | POA: Diagnosis not present

## 2016-08-20 DIAGNOSIS — R002 Palpitations: Secondary | ICD-10-CM | POA: Diagnosis not present

## 2016-08-20 DIAGNOSIS — I4891 Unspecified atrial fibrillation: Secondary | ICD-10-CM | POA: Diagnosis not present

## 2016-08-20 DIAGNOSIS — I480 Paroxysmal atrial fibrillation: Secondary | ICD-10-CM | POA: Diagnosis not present

## 2016-08-20 DIAGNOSIS — Z951 Presence of aortocoronary bypass graft: Secondary | ICD-10-CM | POA: Diagnosis not present

## 2016-08-20 DIAGNOSIS — E785 Hyperlipidemia, unspecified: Secondary | ICD-10-CM | POA: Diagnosis not present

## 2016-08-20 DIAGNOSIS — Z79899 Other long term (current) drug therapy: Secondary | ICD-10-CM

## 2016-08-20 DIAGNOSIS — E669 Obesity, unspecified: Secondary | ICD-10-CM | POA: Diagnosis not present

## 2016-08-20 DIAGNOSIS — I2510 Atherosclerotic heart disease of native coronary artery without angina pectoris: Secondary | ICD-10-CM | POA: Diagnosis not present

## 2016-08-20 DIAGNOSIS — Z7901 Long term (current) use of anticoagulants: Secondary | ICD-10-CM | POA: Diagnosis not present

## 2016-08-20 DIAGNOSIS — I10 Essential (primary) hypertension: Secondary | ICD-10-CM | POA: Diagnosis not present

## 2016-08-20 LAB — BASIC METABOLIC PANEL
Anion gap: 10 (ref 5–15)
BUN: 22 mg/dL — AB (ref 6–20)
CALCIUM: 9.6 mg/dL (ref 8.9–10.3)
CO2: 24 mmol/L (ref 22–32)
CREATININE: 1.04 mg/dL (ref 0.61–1.24)
Chloride: 105 mmol/L (ref 101–111)
GFR calc Af Amer: >60 mL/min (ref >60)
GFR calc non Af Amer: >60 mL/min (ref >60)
Glucose, Bld: 104 mg/dL — ABNORMAL HIGH (ref 65–99)
Potassium: 4.1 mmol/L (ref 3.5–5.1)
SODIUM: 139 mmol/L (ref 135–145)

## 2016-08-20 LAB — CBC
HCT: 40.7 % (ref 39.0–52.0)
Hemoglobin: 13.4 g/dL (ref 13.0–17.0)
MCH: 28.3 pg (ref 26.0–34.0)
MCHC: 32.9 g/dL (ref 30.0–36.0)
MCV: 86 fL (ref 78.0–100.0)
PLATELETS: 276 10*3/uL (ref 150–400)
RBC: 4.73 MIL/uL (ref 4.22–5.81)
RDW: 14 % (ref 11.5–15.5)
WBC: 12.7 10*3/uL — AB (ref 4.0–10.5)

## 2016-08-20 MED ORDER — DILTIAZEM HCL ER COATED BEADS 120 MG PO CP24
120.0000 mg | ORAL_CAPSULE | Freq: Every day | ORAL | Status: DC
  Administered 2016-08-21 – 2016-08-22 (×2): 120 mg via ORAL
  Filled 2016-08-20 (×2): qty 1

## 2016-08-20 MED ORDER — ATORVASTATIN CALCIUM 20 MG PO TABS
20.0000 mg | ORAL_TABLET | Freq: Every day | ORAL | Status: DC
  Administered 2016-08-20 – 2016-08-21 (×2): 20 mg via ORAL
  Filled 2016-08-20: qty 1

## 2016-08-20 MED ORDER — SERTRALINE HCL 50 MG PO TABS: 25.0000 mg | ORAL_TABLET | Freq: Every day | ORAL | Status: DC

## 2016-08-20 MED ORDER — SERTRALINE HCL 50 MG PO TABS
25.0000 mg | ORAL_TABLET | Freq: Every day | ORAL | Status: DC
  Administered 2016-08-21 – 2016-08-22 (×2): 25 mg via ORAL
  Filled 2016-08-20 (×2): qty 1

## 2016-08-20 NOTE — Progress Notes (Signed)
EKG reviewed - there has been prolongation of the QTc to 509 msec. D/W Dr. Lewayne Bunting (cardiac EP) - suggested we try an additional dose tonight. Will d/c celexa as combination may prolong QT interval. Can switch to sertraline. Decrease diltiazem to 120 mg daily for bradycardia. Chlorthalidone added for BP control. Recheck EKG in am tomorrow.  Chrystie Nose, MD, [facility] Attending Cardiologist [facility]

## 2016-08-20 NOTE — Progress Notes (Signed)
Patient Name: [name] SimmondsDanny R Meth Date of Encounter: 08/20/2016  Primary Cardiologist: Dr. Yvonne KendallBranch  Hospital Problem List     Principal Problem:   Atrial fibrillation [facility](HCC) Active Problems:   Obstructive sleep apnea   Coronary atherosclerosis   Encounter for monitoring sotalol therapy    Subjective   Denies any chest discomfort or palpitations. Ambulated this morning with HR in the 50's - 80's. Anxious about his BP (checks this 20-25 daily at home).  Inpatient Medications    Scheduled Meds: . apixaban  5 mg Oral BID  . atorvastatin  20 mg Oral q1800  . chlorthalidone  25 mg Oral Daily  . citalopram  40 mg Oral Daily  . diltiazem  240 mg Oral Daily  . irbesartan  300 mg Oral QHS  . pantoprazole  40 mg Oral Daily  . sotalol  80 mg Oral Q12H   Continuous Infusions:  PRN Meds: acetaminophen, ondansetron (ZOFRAN) IV   Vital Signs    Vitals:   08/19/16 1845 08/19/16 2120 08/20/16 0600 08/20/16 0951  BP: 135/81 136/68 137/64 137/63  Pulse:      Resp: 18 (!) 22 18   Temp:  98.8 F (37.1 C) 97.9 F (36.6 C)   TempSrc:  Oral Oral   SpO2:  98% 97%   Weight:  239 lb (108.4 kg) 239 lb 6.4 oz (108.6 kg)   Height:  5\' 7"  (1.702 m)      Intake/Output Summary (Last 24 hours) at 08/20/16 1053 Last data filed at 08/19/16 2200  Gross per 24 hour  Intake              480 ml  Output                0 ml  Net              480 ml   Filed Weights   08/19/16 2120 08/20/16 0600  Weight: 239 lb (108.4 kg) 239 lb 6.4 oz (108.6 kg)    Physical Exam    GEN: Overweight Caucasian male appearing in no acute distress.  HEENT: Grossly normal.  Neck: Supple, no JVD, carotid bruits, or masses. Cardiac: RRR, no murmurs, rubs, or gallops. No clubbing, cyanosis, edema.  Radials/DP/PT 2+ and equal bilaterally.  Respiratory:  Respirations regular and unlabored, clear to auscultation bilaterally. GI: Soft, nontender, nondistended, BS + x 4. MS: no deformity or atrophy. Skin: warm and dry,  no rash. Neuro:  Strength and sensation are intact. Psych: AAOx3.  Normal affect.  Labs    CBC  Recent Labs  08/19/16 1037 08/20/16 0540  WBC 11.0* 12.7*  HGB 15.7 13.4  HCT 45.5 40.7  MCV 84.3 86.0  PLT 295 276   Basic Metabolic Panel  Recent Labs  08/19/16 1037 08/19/16 1241 08/20/16 0540  NA 137  --  139  K 4.0  --  4.1  CL 104  --  105  CO2 21*  --  24  GLUCOSE 106*  --  104*  BUN 19  --  22*  CREATININE 1.16  --  1.04  CALCIUM 10.1  --  9.6  MG  --  2.1  --    Liver Function Tests No results for input(s): AST, ALT, ALKPHOS, BILITOT, PROT, ALBUMIN in the last 72 hours. No results for input(s): LIPASE, AMYLASE in the last 72 hours. Cardiac Enzymes No results for input(s): CKTOTAL, CKMB, CKMBINDEX, TROPONINI in the last 72 hours. BNP Invalid input(s): POCBNP D-Dimer No results  for input(s): DDIMER in the last 72 hours. Hemoglobin A1C No results for input(s): HGBA1C in the last 72 hours. Fasting Lipid Panel No results for input(s): CHOL, HDL, LDLCALC, TRIG, CHOLHDL, LDLDIRECT in the last 72 hours. Thyroid Function Tests  Recent Labs  08/19/16 1942  TSH 3.869    Telemetry    Sinus bradycardia, HR in mid-40's to 50's.  - Personally Reviewed  ECG    Sinus bradycardia, HR 48, with QTc of 493. - Personally Reviewed  Radiology    Dg Chest 2 View: Result Date: 08/19/2016 CLINICAL DATA:  Chest pain, shortness of breath. EXAM: CHEST  2 VIEW COMPARISON:  Radiograph of March 27, 2015. FINDINGS: Stable cardiomediastinal silhouette. Status post coronary artery bypass graft. Atherosclerosis of thoracic aorta is noted. No pneumothorax or pleural effusion is noted. Mild bibasilar subsegmental atelectasis is noted. Bony thorax is unremarkable. IMPRESSION: Aortic atherosclerosis.  Mild bibasilar subsegmental atelectasis. Electronically Signed   By: Lupita Raider, M.D.   On: 08/19/2016 11:56   Cardiac Studies   Echocardiogram: 03/2015 - Left ventricle: The  cavity size was normal. Wall thickness was   increased in a pattern of mild LVH. Systolic function was normal.   The estimated ejection fraction was in the range of 50% to 55%.   Wall motion was normal; there were no regional wall motion   abnormalities. Doppler parameters are consistent with high   ventricular filling pressure. - Left atrium: The atrium was mildly dilated.  Patient Profile     65 yo male w/ PMH of CAD (s/p CABG in 2004), HTN, HLD, OSA (on CPAP) and PAF who presented to [facility] ED on 08/19/2016 for palpitations, found to be in atrial fibrillation with RVR prior to spontaneously converting. Has been admitted for initiation of Sotalol.   Assessment & Plan    1. Paroxysmal Atrial Fibrillation/ Sotalol Monitoring - presented in atrial fibrillation with RVR. Has been having multiple episodes of this at home occurring to the patient. Has been admitted for initiation of antiarrhythmic therapy with Sotalol.  - This patients CHA2DS2-VASc Score and unadjusted Ischemic Stroke Rate (% per year) is equal to 2.2 % stroke rate/year from a score of 2 (HTN, Vascular). Continue Eliquis 5mg  BID.  - started on Sotalol 80mg  BID. QTc up from 468 on admission to 493 this AM. Will obtain repeat EKG this afternoon. If QTc continuing to rise, consider EP evaluation. - has been bradycardiac overnight into the 40's. HR currently in the mid-50's. Toprol-XL has been discontinued. Currently on Cardizem CD 240mg  daily. Would recommend dose reduction but patient hesitant of this with variable BP readings at home.  2. CAD - s/p CABG in 2004. EKG without acute ischemic changes.  - continue statin therapy. No ASA secondary to need for Eliquis.  3. HTN - BP well-controlled at 110/53 - 141/83 in the past 24 hours.  - continue Avapro and Cardizem CD.   4. OSA  - continue CPAP  Signed, Ellsworth Lennox, PA  08/20/2016, 10:53 AM

## 2016-08-20 NOTE — Progress Notes (Signed)
Patient has home CPAP unit and does not need any assistance with setting it up for the night

## 2016-08-20 NOTE — Progress Notes (Signed)
  QTc at 509 ms this afternoon. Discussed with Dr. Rennis Golden and Dr. Ladona Ridgel with EP. Will continue with Sotalol at current dosing for now. Will discontinue Celexa due to QTc prolonging influence and switch to Zoloft 25mg  daily. Will recheck EKG in the morning along with BMET.   Signed, Ellsworth Lennox, PA-C 08/20/2016, 4:40 PM Pager: [phone]

## 2016-08-20 NOTE — H&P (Signed)
Patient ID: [name] [name] MRN: [id number], DOB/AGE: 12-31-1950   Admit date: 08/19/2016   Reason for Consult: Chest Pain Requesting MD: Dr. Rush Landmark, [facility] ED   Primary Physician: No primary care provider on file. Primary Cardiologist: [name]   Pt. Profile:  65 year old obese male with a history of CAD status post 2 vessel CABG surgery in 2004 at [facility] with low risk nuclear stress test in 2016, paroxysmal atrial fibrillation on rate control therapy with Metroprolol and Cardizem as well as anticoagulation therapy with Eliquis, obstructive sleep apnea fully compliant with CPAP, HTN and hyperlipidemia presenting to the ED with complaint of chest pain and tachypalpitations. Found to be in atrial fibrillation with RVR. He spontaneously converted to normal sinus rhythm in the ED.  Problem List  Past Medical History:  Diagnosis Date  . Atrial fibrillation (HCC)   . Chronic chest pain   . Chronic sinus bradycardia   . Coronary artery disease    2v CABG, 2004 [facility])  . DJD (degenerative joint disease)   . Edema   . HLD (hyperlipidemia)   . Hypertension   . MI, old 2004  . OA (osteoarthritis)   . Obesity   . Palpitations    Reported history of PVCs  . Sleep apnea    on CPAP  . Spinal stenosis     Past Surgical History:  Procedure Laterality Date  . BACK SURGERY  2011  . CARPAL TUNNEL RELEASE     bilateral  . CORONARY ARTERY BYPASS GRAFT     2004  . fistula surgery    . hemorrhoidectomy    . LUMBAR LAMINECTOMY    . right foot fracture    . TRANSTHORACIC ECHOCARDIOGRAM  08/25/2010   EF 55-60%     Allergies  Allergies  Allergen Reactions  . Nsaids Other (See Comments)    On blood thinner  . Vancomycin Other (See Comments)    Was informed after heart surgery ? Breathing problem/patient unsure    HPI  65 y/o male, followed by [name], with a h/o CAD s/p prior 2 vessel CABG in 2004 at [facility]. Apparently had follow up cath in 2006 in [place]  with patent grafts. Reports negative stress test in 2011 by Dr Hyacinth Meeker in [place]. In 03/2015, he had a Lexiscan NST that was negative for ischemia.  EF was 61%. 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 He also has a h/o paroxsymal atrial fibrillation on Eliquis, OSA compliant with CPAP, HTN and HLD. Last seen by [name] 02/06/16 and was stable. He was instructed to f/u in 1 year.   He now presents to the [facility] ED with a complaint of chest pain and tachypalpitations. He can tell whenever he goes in atrial fibrillation. He states that he has been troubled by multiple recurrent episodes of paroxysmal atrial fibrillation recently. He feels his heart racing when this happens. He gets slightly diaphoretic and mildly short of breath but denies any syncope/near-syncope. He reports full medication compliance. He is on rate control strategy consisting of Cardizem and Metroprolol. He's been fully compliant with Eilauis. No missed doses. He also states that he has been fully compliant with CPAP therapy every night. He does not refrain from caffeine. He drinks diet [facility] regularly. Occasionally will have a beer or 2. No recent fever, chills, nausea, vomiting or diarrhea.   Today when his episode occurred he reports his heart rate was in the 160s. He felt poorly  with this. Subsequently, he came into the emergency department. He was noted to be in atrial fib with a rapid ventricular response. This was a short episode that spontaneously resolved without intervention. Telemetry currently shows sinus rhythm. His heart rate is back to his baseline in the 50s. Palpitations resolved. He still has mild chest discomfort. It feels like a soreness in his chest. This is different from the anginal symptoms he had prior to undergoing bypass surgery  In 2004 which felt like heavy pressure. He reports that he is active. He works out at [employer] several days a week. He is able to exercise on the stationary bike  30 minutes at a time without any exertional chest pain or dyspnea. He reports that he is frustrated given the recurrence rate of his symptoms. He is open to pursue other therapies to better control his arrhythmia.  Home Medications  Prior to Admission medications   Medication Sig Start Date End Date Taking? Authorizing Provider  acetaminophen (TYLENOL) 650 MG CR tablet Take 650 mg by mouth every 8 (eight) hours as needed for pain.    Yes Historical Provider, MD  allopurinol (ZYLOPRIM) 100 MG tablet Take 100 mg by mouth daily as needed (gout).    Yes Historical Provider, MD  apixaban (ELIQUIS) 5 MG TABS tablet Take 1 tablet (5 mg total) by mouth 2 (two) times daily. 03/11/16  Yes Antoine Poche, MD  atorvastatin (LIPITOR) 20 MG tablet Take 1 tablet (20 mg total) by mouth daily. 02/06/16  Yes Antoine Poche, MD  chlorthalidone (HYGROTON) 25 MG tablet Take 1 tablet (25 mg total) by mouth daily. 06/01/16  Yes Antoine Poche, MD  citalopram (CELEXA) 40 MG tablet Take 1 tablet (40 mg total) by mouth daily. 07/09/16  Yes Etta Grandchild, MD  Coenzyme Q10 (CO Q 10) 100 MG CAPS Take 400 mg by mouth daily.   Yes Historical Provider, MD  diltiazem (CARDIZEM CD) 240 MG 24 hr capsule Take 1 capsule (240 mg total) by mouth daily. 02/06/16  Yes Antoine Poche, MD  fish oil-omega-3 fatty acids 1000 MG capsule Take 3 g by mouth 2 (two) times daily.    Yes Historical Provider, MD  irbesartan (AVAPRO) 300 MG tablet Take 1 tablet (300 mg total) by mouth at bedtime. 07/23/16 07/23/17 Yes Antoine Poche, MD  metoprolol succinate (TOPROL-XL) 50 MG 24 hr tablet Take 1 tablet (50 mg total) by mouth 2 (two) times daily. 02/06/16  Yes Antoine Poche, MD  Multiple Vitamin (MULTIVITAMIN) tablet Take 1 tablet by mouth daily.   Yes Historical Provider, MD  omeprazole (PRILOSEC) 20 MG capsule Take 20 mg by mouth every other day.    Yes Historical Provider, MD    Family History  Family History  Problem Relation Age of  Onset  . Breast cancer Mother   . Hypertension Mother   . Coronary artery disease Father   . Stroke Father   . Hypertension Father     Social History  Social History   Social History  . Marital status: Married    Spouse name: [name]  . Number of children: 0  . 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 Smoking status: Never Smoker  . Smokeless tobacco: Never Used  . Alcohol use No  . Drug use: No  . Sexual activity: Yes   Other Topics Concern  . Not on file  Social History Narrative  . No narrative on file     Review of Systems General:  No chills, fever, night sweats or weight changes.  Cardiovascular:  + chest pain, dyspnea on exertion, edema, orthopnea, palpitations, paroxysmal nocturnal dyspnea. Dermatological: No rash, lesions/masses Respiratory: No cough, dyspnea Urologic: No hematuria, dysuria Abdominal:   No nausea, vomiting, diarrhea, bright red blood per rectum, melena, or hematemesis Neurologic:  No visual changes, wkns, changes in mental status. All other systems reviewed and are otherwise negative except as noted above.  Physical Exam  Blood pressure 137/63, pulse (!) 53, temperature 97.9 F (36.6 C), temperature source Oral, resp. rate 18, height 5\' 7"  (1.702 m), weight 239 lb 6.4 oz (108.6 kg), SpO2 97 %.  General: Pleasant, NAD, obese  Psych: Normal affect. Neuro: Alert and oriented X 3. Moves all extremities spontaneously. HEENT: Normal  Neck: Supple without bruits or JVD. Lungs:  Resp regular and unlabored, CTA. Heart: RRR no s3, s4, or murmurs. Abdomen: Soft, non-tender, non-distended, BS + x 4.  Extremities: No clubbing, cyanosis or edema. DP/PT/Radials 2+ and equal bilaterally.  Labs  Troponin (Point of Care Test)  Recent Labs  08/19/16 1500  TROPIPOC 0.01   No results for input(s): CKTOTAL, CKMB, TROPONINI in the last 72 hours. Lab Results  Component  Value Date   WBC 12.7 (H) 08/20/2016   HGB 13.4 08/20/2016   HCT 40.7 08/20/2016   MCV 86.0 08/20/2016   PLT 276 08/20/2016     Recent Labs Lab 08/20/16 0540  NA 139  K 4.1  CL 105  CO2 24  BUN 22*  CREATININE 1.04  CALCIUM 9.6  GLUCOSE 104*   Lab Results  Component Value Date   CHOL 191 07/25/2014   HDL 39 (L) 07/25/2014   LDLCALC 116 (H) 07/25/2014   TRIG 181 (H) 07/25/2014   Lab Results  Component Value Date   DDIMER (H) 08/23/2010    0.51        AT THE INHOUSE ESTABLISHED CUTOFF VALUE OF 0.48 ug/mL FEU, THIS ASSAY HAS BEEN DOCUMENTED IN THE LITERATURE TO HAVE A SENSITIVITY AND NEGATIVE PREDICTIVE VALUE OF AT LEAST 98 TO 99%.  THE TEST RESULT SHOULD BE CORRELATED WITH AN ASSESSMENT OF THE CLINICAL PROBABILITY OF DVT / VTE.     Radiology/Studies  Dg Chest 2 View  Result Date: 08/19/2016 CLINICAL DATA:  Chest pain, shortness of breath. EXAM: CHEST  2 VIEW COMPARISON:  Radiograph of March 27, 2015. FINDINGS: Stable cardiomediastinal silhouette. Status post coronary artery bypass graft. Atherosclerosis of thoracic aorta is noted. No pneumothorax or pleural effusion is noted. Mild bibasilar subsegmental atelectasis is noted. Bony thorax is unremarkable. IMPRESSION: Aortic atherosclerosis.  Mild bibasilar subsegmental atelectasis. Electronically Signed   By: Lupita Raider, M.D.   On: 08/19/2016 11:56    ECG  Sinus brady. 52 bpm   ASSESSMENT AND PLAN  1. Paroxysmal atrial fibrillation: Pt with a history of atrial fibrillation on rate control strategy with Metroprolol and Cardizem. Also on Eliquis for anticoagulation therapy. He has had frequent episodes of recurrent tachypalpitations and has been symptomatic with this. He initially presented to the ED with A. fib with RVR but spontaneously converted back to normal sinus rhythm. Telemetry currently shows sinus bradycardia with a rate in the 50s. K is WNL. Renal function stable. WBC slightly abnormal at 11.0.  However he is afebrile. CXR unremarkable. POC troponin is negative. BNP slightly abnormal at 166. There is little room to increase rate control agents  given baseline HR in the 50s. Given he is in NSR now, we will d/c from the ED and arrange f/u in the afib clinic. He will likely need an antiarrythmic. Continue Eliquis.   Attending attestation:  Pt. Seen and examined. Agree with the Resident/NP/PA-C note as written. Mr. Jeanella Crazeierce is a 65 year old patient of Dr. branch in [place], who lives in [place]. Over the past several weeks she's had 2 or 3 episodes of paroxysmal A. fib which seem to be more symptomatic and occurring more frequently as well as lasting longer. He is on Eliquis and has a CHADSVASC score of 3. He uses metoprolol and Cardizem for rate control and also blood pressure control. He does note however significant fatigue, particularly when exercising. Even with high rate of exercise, he can only get his heart rate up into the upper 70s. Weight is also a factor at 254 pounds. Today he was found to be in atrial fibrillation with rapid ventricular response. He had a family member drive him to  from [place]. He was then noted to spontaneously convert to sinus rhythm in the emergency department and felt much better. We discussed options including having him come back possibly for an A. fib clinic appointment, or follow-up with his cardiologist (who is currently on vacation), or starting antiarrhythmic therapy. Since it is likely that he would need to be admitted anyhow for starting antiarrhythmic therapy, I would like to take this opportunity to do that. We discussed options and I feel that a good option for him could be sotalol 80 mg BID (can use the a-fib/flutter based order set), as cost is a significant issue. Need to monitor QTC by EKG's. He does have a history of coronary disease, but had prior CABG in 2004 and a negative nuclear stress test in June 2016. There is normal LV  function. I would recommend stopping his b-blocker and he may need an ACE-I or ARB for additional BP control. Continue Eliquis for anticoagulation. Will continue CPAP QHS - family has his machine with them.  Chrystie NoseKenneth C. Kaiah Hosea, MD, [facility] Attending Cardiologist [facility]  Lisette AbuKenneth C Laykin Rainone 08/20/2016, 11:18 AM

## 2016-08-21 ENCOUNTER — Encounter: Payer: Self-pay | Admitting: Pulmonary Disease

## 2016-08-21 DIAGNOSIS — E669 Obesity, unspecified: Secondary | ICD-10-CM | POA: Diagnosis not present

## 2016-08-21 DIAGNOSIS — R002 Palpitations: Secondary | ICD-10-CM | POA: Diagnosis not present

## 2016-08-21 DIAGNOSIS — Z951 Presence of aortocoronary bypass graft: Secondary | ICD-10-CM | POA: Diagnosis not present

## 2016-08-21 DIAGNOSIS — I4891 Unspecified atrial fibrillation: Secondary | ICD-10-CM | POA: Diagnosis not present

## 2016-08-21 DIAGNOSIS — I10 Essential (primary) hypertension: Secondary | ICD-10-CM | POA: Diagnosis not present

## 2016-08-21 DIAGNOSIS — I2510 Atherosclerotic heart disease of native coronary artery without angina pectoris: Secondary | ICD-10-CM | POA: Diagnosis not present

## 2016-08-21 DIAGNOSIS — G4733 Obstructive sleep apnea (adult) (pediatric): Secondary | ICD-10-CM | POA: Diagnosis not present

## 2016-08-21 DIAGNOSIS — Z7901 Long term (current) use of anticoagulants: Secondary | ICD-10-CM | POA: Diagnosis not present

## 2016-08-21 DIAGNOSIS — F411 Generalized anxiety disorder: Secondary | ICD-10-CM

## 2016-08-21 DIAGNOSIS — R69 Illness, unspecified: Secondary | ICD-10-CM | POA: Diagnosis not present

## 2016-08-21 DIAGNOSIS — Z5181 Encounter for therapeutic drug level monitoring: Secondary | ICD-10-CM | POA: Diagnosis not present

## 2016-08-21 DIAGNOSIS — E785 Hyperlipidemia, unspecified: Secondary | ICD-10-CM | POA: Diagnosis not present

## 2016-08-21 DIAGNOSIS — F419 Anxiety disorder, unspecified: Secondary | ICD-10-CM

## 2016-08-21 DIAGNOSIS — I480 Paroxysmal atrial fibrillation: Secondary | ICD-10-CM | POA: Diagnosis not present

## 2016-08-21 LAB — BASIC METABOLIC PANEL
Anion gap: 12 (ref 5–15)
BUN: 20 mg/dL (ref 6–20)
CHLORIDE: 103 mmol/L (ref 101–111)
CO2: 23 mmol/L (ref 22–32)
CREATININE: 1 mg/dL (ref 0.61–1.24)
Calcium: 9.7 mg/dL (ref 8.9–10.3)
GFR calc non Af Amer: >60 mL/min (ref >60)
Glucose, Bld: 100 mg/dL — ABNORMAL HIGH (ref 65–99)
Potassium: 3.6 mmol/L (ref 3.5–5.1)
Sodium: 138 mmol/L (ref 135–145)

## 2016-08-21 NOTE — Progress Notes (Signed)
Patient Name: [name] [name] Date of Encounter: 08/21/2016  Primary Cardiologist: Dr. Yvonne Kendall Problem List     Principal Problem:   Atrial fibrillation [facility]) Active Problems:   Obstructive sleep apnea   Coronary atherosclerosis   Encounter for monitoring sotalol therapy    Subjective  Feels great today- more energy. Remains in sinus. QTc of 492 today. Stopped celexa d/t QTc prolongation with sotalol and switched to sertraline. BP has been normal.   Inpatient Medications    Scheduled Meds: . apixaban  5 mg Oral BID  . atorvastatin  20 mg Oral q1800  . chlorthalidone  25 mg Oral Daily  . diltiazem  120 mg Oral Daily  . irbesartan  300 mg Oral QHS  . pantoprazole  40 mg Oral Daily  . sertraline  25 mg Oral Daily  . sotalol  80 mg Oral Q12H   Continuous Infusions:  PRN Meds: acetaminophen, ondansetron (ZOFRAN) IV   Vital Signs    Vitals:   08/20/16 0951 08/20/16 1300 08/20/16 1900 08/21/16 0300  BP: 137/63 128/69 136/78 126/70  Pulse:  (!) 59 (!) 52 (!) 51  Resp:  17 18 (!) 21  Temp:  98 F (36.7 C) 98 F (36.7 C) 97.7 F (36.5 C)  TempSrc:  Oral Oral Oral  SpO2:  98% 97% 96%  Weight:    250 lb 1.6 oz (113.4 kg)  Height:        Intake/Output Summary (Last 24 hours) at 08/21/16 0854 Last data filed at 08/21/16 0847  Gross per 24 hour  Intake              840 ml  Output                0 ml  Net              840 ml   Filed Weights   08/19/16 2120 08/20/16 0600 08/21/16 0300  Weight: 239 lb (108.4 kg) 239 lb 6.4 oz (108.6 kg) 250 lb 1.6 oz (113.4 kg)    Physical Exam    GEN: Overweight Caucasian male appearing in no acute distress.  HEENT: Grossly normal.  Neck: Supple, no JVD, carotid bruits, or masses. Cardiac: RRR, no murmurs, rubs, or gallops. No clubbing, cyanosis, edema.  Radials/DP/PT 2+ and equal bilaterally.  Respiratory:  Respirations regular and unlabored, clear to auscultation bilaterally. GI: Soft, nontender, nondistended, BS +  x 4. MS: no deformity or atrophy. Skin: warm and dry, no rash. Neuro:  Strength and sensation are intact. Psych: AAOx3.  Normal affect.  Labs    CBC  Recent Labs  08/19/16 1037 08/20/16 0540  WBC 11.0* 12.7*  HGB 15.7 13.4  HCT 45.5 40.7  MCV 84.3 86.0  PLT 295 276   Basic Metabolic Panel  Recent Labs  08/19/16 1241 08/20/16 0540 08/21/16 0451  NA  --  139 138  K  --  4.1 3.6  CL  --  105 103  CO2  --  24 23  GLUCOSE  --  104* 100*  BUN  --  22* 20  CREATININE  --  1.04 1.00  CALCIUM  --  9.6 9.7  MG 2.1  --   --    Liver Function Tests No results for input(s): AST, ALT, ALKPHOS, BILITOT, PROT, ALBUMIN in the last 72 hours. No results for input(s): LIPASE, AMYLASE in the last 72 hours. Cardiac Enzymes No results for input(s): CKTOTAL, CKMB, CKMBINDEX, TROPONINI in the last  72 hours. BNP Invalid input(s): POCBNP D-Dimer No results for input(s): DDIMER in the last 72 hours. Hemoglobin A1C No results for input(s): HGBA1C in the last 72 hours. Fasting Lipid Panel No results for input(s): CHOL, HDL, LDLCALC, TRIG, CHOLHDL, LDLDIRECT in the last 72 hours. Thyroid Function Tests  Recent Labs  08/19/16 1942  TSH 3.869    Telemetry    Sinus bradycardia  ECG    Sinus bradycardia, HR 51, incomplete RBBB, with QTc of 492  Radiology    Dg Chest 2 View: Result Date: 08/19/2016 CLINICAL DATA:  Chest pain, shortness of breath. EXAM: CHEST  2 VIEW COMPARISON:  Radiograph of March 27, 2015. FINDINGS: Stable cardiomediastinal silhouette. Status post coronary artery bypass graft. Atherosclerosis of thoracic aorta is noted. No pneumothorax or pleural effusion is noted. Mild bibasilar subsegmental atelectasis is noted. Bony thorax is unremarkable. IMPRESSION: Aortic atherosclerosis.  Mild bibasilar subsegmental atelectasis. Electronically Signed   By: Lupita RaiderJames  Green Jr, M.D.   On: 08/19/2016 11:56   Cardiac Studies   Echocardiogram: 03/2015 - Left ventricle: The  cavity size was normal. Wall thickness was   increased in a pattern of mild LVH. Systolic function was normal.   The estimated ejection fraction was in the range of 50% to 55%.   Wall motion was normal; there were no regional wall motion   abnormalities. Doppler parameters are consistent with high   ventricular filling pressure. - Left atrium: The atrium was mildly dilated.  Patient Profile     65 yo male w/ PMH of CAD (s/p CABG in 2004), HTN, HLD, OSA (on CPAP) and PAF who [facility] for palpitations, found to be in atrial fibrillation with RVR prior to spontaneously converting. Has been admitted for initiation of Sotalol.   Assessment & Plan    1. Paroxysmal Atrial Fibrillation/ Sotalol Monitoring - presented in atrial fibrillation with RVR. Has been having multiple episodes of this at home occurring to the patient. Has been admitted for initiation of antiarrhythmic therapy with Sotalol.  - This patients CHA2DS2-VASc Score and unadjusted Ischemic Stroke Rate (% per year) is equal to 2.2 % stroke rate/year from a score of 2 (HTN, Vascular). Continue Eliquis 5mg  BID.  - started on Sotalol 80mg  BID. Qtc seems to be stable -continue Sotalol 80 mg BID - 5th dose tonight (will be at steady state), anticipate d/c in the am tomorrow.  2. CAD - s/p CABG in 2004. EKG without acute ischemic changes.  - continue statin therapy. No ASA secondary to need for Eliquis.  3. HTN - BP well-controlled at 110/53 - 141/83 in the past 24 hours.  - continue Avapro and Cardizem CD.   4. OSA  - continue CPAP  5. Anxiety - switched celexa to sertraline d/t QTc prolongation with the former.  Chrystie NoseKenneth C. Eunie Lawn, MD, [facility] Attending Cardiologist [facility]  Chrystie NoseKenneth C Kyannah Climer, MD  08/21/2016, 8:54 AM

## 2016-08-21 NOTE — Progress Notes (Signed)
Patient has home CPAP unit and does not need any help with CPAP.

## 2016-08-22 ENCOUNTER — Encounter (HOSPITAL_COMMUNITY): Payer: Self-pay | Admitting: Physician Assistant

## 2016-08-22 DIAGNOSIS — E669 Obesity, unspecified: Secondary | ICD-10-CM

## 2016-08-22 DIAGNOSIS — F411 Generalized anxiety disorder: Secondary | ICD-10-CM

## 2016-08-22 DIAGNOSIS — Z7901 Long term (current) use of anticoagulants: Secondary | ICD-10-CM | POA: Diagnosis not present

## 2016-08-22 DIAGNOSIS — F419 Anxiety disorder, unspecified: Secondary | ICD-10-CM

## 2016-08-22 DIAGNOSIS — R002 Palpitations: Secondary | ICD-10-CM | POA: Diagnosis not present

## 2016-08-22 DIAGNOSIS — Z5181 Encounter for therapeutic drug level monitoring: Secondary | ICD-10-CM | POA: Diagnosis not present

## 2016-08-22 DIAGNOSIS — Z951 Presence of aortocoronary bypass graft: Secondary | ICD-10-CM | POA: Diagnosis not present

## 2016-08-22 DIAGNOSIS — I480 Paroxysmal atrial fibrillation: Secondary | ICD-10-CM | POA: Diagnosis not present

## 2016-08-22 DIAGNOSIS — I10 Essential (primary) hypertension: Secondary | ICD-10-CM | POA: Diagnosis not present

## 2016-08-22 DIAGNOSIS — I2510 Atherosclerotic heart disease of native coronary artery without angina pectoris: Secondary | ICD-10-CM | POA: Diagnosis not present

## 2016-08-22 DIAGNOSIS — E785 Hyperlipidemia, unspecified: Secondary | ICD-10-CM | POA: Diagnosis not present

## 2016-08-22 DIAGNOSIS — G4733 Obstructive sleep apnea (adult) (pediatric): Secondary | ICD-10-CM | POA: Diagnosis not present

## 2016-08-22 DIAGNOSIS — R69 Illness, unspecified: Secondary | ICD-10-CM | POA: Diagnosis not present

## 2016-08-22 DIAGNOSIS — I4891 Unspecified atrial fibrillation: Secondary | ICD-10-CM | POA: Diagnosis not present

## 2016-08-22 LAB — BASIC METABOLIC PANEL
ANION GAP: 11 (ref 5–15)
BUN: 19 mg/dL (ref 6–20)
CALCIUM: 9.5 mg/dL (ref 8.9–10.3)
CO2: 23 mmol/L (ref 22–32)
CREATININE: 0.96 mg/dL (ref 0.61–1.24)
Chloride: 105 mmol/L (ref 101–111)
GLUCOSE: 101 mg/dL — AB (ref 65–99)
Potassium: 3.7 mmol/L (ref 3.5–5.1)
Sodium: 139 mmol/L (ref 135–145)

## 2016-08-22 MED ORDER — SOTALOL HCL 80 MG PO TABS: 80.0000 mg | ORAL_TABLET | Freq: Two times a day (BID) | ORAL | 2 refills | Status: DC

## 2016-08-22 MED ORDER — DILTIAZEM HCL ER COATED BEADS 120 MG PO CP24: 120.0000 mg | ORAL_CAPSULE | Freq: Every day | ORAL | 6 refills | Status: DC

## 2016-08-22 MED ORDER — SERTRALINE HCL 25 MG PO TABS: 25.0000 mg | ORAL_TABLET | Freq: Every day | ORAL | 0 refills | Status: DC

## 2016-08-22 NOTE — Progress Notes (Signed)
Discharge education reviewed with patient. Patient has no questions at this time. IV dc. Pt discharged home with wife.

## 2016-08-22 NOTE — Discharge Summary (Signed)
Discharge Summary    Patient ID: [name] [name],  MRN: [id number], DOB/AGE: 06-Jul-1951 65 y.o.  Admit date: 08/19/2016 Discharge date: 08/22/2016  Primary Care Provider: No primary care provider on file. Primary Cardiologist: [name] in [place]   Discharge Diagnoses    Principal Problem:   Paroxysmal atrial fibrillation [facility]) Active Problems:   Obstructive sleep apnea   Coronary atherosclerosis   Chronic sinus bradycardia   Encounter for monitoring sotalol therapy   Obesity   Anxiety   Diagnostic Studies/Procedures    [name] _____________   History of Present Illness & Hospital Course    [name] [name] is a 65 y.o. male with history of CAD status post 2 vessel CABG surgery in 2004 at [facility] with low risk nuclear stress test in 2016, paroxysmal atrial fibrillation, obstructive sleep apnea fully compliant with CPAP, HTN, HLD, sinus bradycardia, obesity, spinal stenosis presented to [facility] with chest pain and tachypalpitations. He was found to be back in atrial fib with RVR and converted to NSR in the ED. His EKG was without ischemic changes. He was admitted for initiation of antiarrhythmic therapy with sotalol at 80mg  BID. His Toprol was stopped to allow for this. His diltiazem dose was decreased for sinus bradycardia. TSH was normal. Mg was 2.1. Potassium ranged 3.7-4.1 (per d/w Dr. Rennis Golden, no additional supp was necessary.) His Eliquis was continued. He is not on ASA due to need for Eliquis and no active ACS. His celexa was switched to sertraline due to QTc prolongation with the former. This morning he is maintaining NSR with QTc (slightly less by Dr. Blanchie Dessert measurement) and the patient feels great today. Dr. Rennis Golden has seen and examined the patient today and feels he is stable for discharge.  I have sent a message to our [facility] office's scheduler requesting a 1 week TOC follow-up appointment, and our office will call the patient with this information. He will need a BMET at this  visit. May also consider a CBC given mild leukocytosis this admission.  We also wished the patient a happy birthday as he turned 6 this admission.  _____________  Discharge Vitals Blood pressure (!) 116/58, pulse (!) 54, temperature 97.7 F (36.5 C), temperature source Oral, resp. rate (!) 21, height 5\' 7"  (1.702 m), weight 249 lb 3.2 oz (113 kg), SpO2 97 %.  Filed Weights   08/20/16 0600 08/21/16 0300 08/22/16 0455  Weight: 239 lb 6.4 oz (108.6 kg) 250 lb 1.6 oz (113.4 kg) 249 lb 3.2 oz (113 kg)    Labs & Radiologic Studies    CBC  Recent Labs  08/19/16 1037 08/20/16 0540  WBC 11.0* 12.7*  HGB 15.7 13.4  HCT 45.5 40.7  MCV 84.3 86.0  PLT 295 276   Basic Metabolic Panel  Recent Labs  08/19/16 1241  08/21/16 0451 08/22/16 0358  NA  --   < > 138 139  K  --   < > 3.6 3.7  CL  --   < > 103 105  CO2  --   < > 23 23  GLUCOSE  --   < > 100* 101*  BUN  --   < > 20 19  CREATININE  --   < > 1.00 0.96  CALCIUM  --   < > 9.7 9.5  MG 2.1  --   --   --   < > = values in this interval not displayed. Thyroid Function Tests  Recent Labs  08/19/16 1942  TSH  3.869   _____________  Dg Chest 2 View  Result Date: 08/19/2016 CLINICAL DATA:  Chest pain, shortness of breath. EXAM: CHEST  2 VIEW COMPARISON:  Radiograph of March 27, 2015. FINDINGS: Stable cardiomediastinal silhouette. Status post coronary artery bypass graft. Atherosclerosis of thoracic aorta is noted. No pneumothorax or pleural effusion is noted. Mild bibasilar subsegmental atelectasis is noted. Bony thorax is unremarkable. IMPRESSION: Aortic atherosclerosis.  Mild bibasilar subsegmental atelectasis. Electronically Signed   By: Lupita Raider, M.D.   On: 08/19/2016 11:56   Disposition   Pt is being discharged home today in good condition.  Follow-up Plans & Appointments    Follow-up Information    Dina Rich, MD Follow up.   Specialty:  Cardiology Why:  Dr. Verna Czech office will call you for a  follow-up appointment in 1 week - anticipate you will also have labwork at that time. Call the office if you have not heard back by Monday afternoon. Contact information: [address] [place] [address] [phone]          Discharge Instructions    Amb referral to AFIB Clinic    Complete by:  As directed    Diet - low sodium heart healthy    Complete by:  As directed    Increase activity slowly    Complete by:  As directed    Your citalopram was stopped and was changed to sertraline. Please follow up with your primary care provider for refills. Your diltiazem dose was decreased (new prescription). Your metoprolol was stopped. You were started on sotalol.      Discharge Medications     Medication List    STOP taking these medications   citalopram 40 MG tablet Commonly known as:  CELEXA   metoprolol succinate 50 MG 24 hr tablet Commonly known as:  TOPROL-XL     TAKE these medications   acetaminophen 650 MG CR tablet Commonly known as:  TYLENOL Take 650 mg by mouth every 8 (eight) hours as needed for pain.   allopurinol 100 MG tablet Commonly known as:  ZYLOPRIM Take 100 mg by mouth daily as needed (gout).   apixaban 5 MG Tabs tablet Commonly known as:  ELIQUIS Take 1 tablet (5 mg total) by mouth 2 (two) times daily.   atorvastatin 20 MG tablet Commonly known as:  LIPITOR Take 1 tablet (20 mg total) by mouth daily.   chlorthalidone 25 MG tablet Commonly known as:  HYGROTON Take 1 tablet (25 mg total) by mouth daily.   Co Q 10 100 MG Caps Take 400 mg by mouth daily.   diltiazem 120 MG 24 hr capsule Commonly known as:  CARDIZEM CD Take 1 capsule (120 mg total) by mouth daily. What changed:  medication strength  how much to take   fish oil-omega-3 fatty acids 1000 MG capsule Take 3 g by mouth 2 (two) times daily.   irbesartan 300 MG tablet Commonly known as:  AVAPRO Take 1 tablet (300 mg total) by mouth at bedtime.   multivitamin  tablet Take 1 tablet by mouth daily.   omeprazole 20 MG capsule Commonly known as:  PRILOSEC Take 20 mg by mouth every other day.   sertraline 25 MG tablet Commonly known as:  ZOLOFT Take 1 tablet (25 mg total) by mouth daily.   sotalol 80 MG tablet Commonly known as:  BETAPACE Take 1 tablet (80 mg total) by mouth every 12 (twelve) hours.        Allergies:  Allergies  Allergen Reactions  . Nsaids Other (See Comments)    On blood thinner  . Vancomycin Other (See Comments)    Was informed after heart surgery ? Breathing problem/patient unsure     Outstanding Labs/Studies   Will need BMET, consider CBC  Duration of Discharge Encounter   Greater than 30 minutes including physician time.  Signed, Laurann Montanaayna N Dunn PA-C 08/22/2016, 8:33 AM

## 2016-08-22 NOTE — Progress Notes (Signed)
Patient Name: TAUREAN BABICZ Date of Encounter: 08/22/2016  Primary Cardiologist: Dr. Yvonne Kendall Problem List     Principal Problem:   Atrial fibrillation [facility]) Active Problems:   Obstructive sleep apnea   Coronary atherosclerosis   Encounter for monitoring sotalol therapy    Subjective  Feels great today- more energy. No events overnight. Maintaining sinus - QTc 500 msec (slightly less by my measurement) - was 492 msec yesterday, so no significant difference on sotalol 80 mg BID. Blood pressure generally well controlled.   Inpatient Medications    Scheduled Meds: . apixaban  5 mg Oral BID  . atorvastatin  20 mg Oral q1800  . chlorthalidone  25 mg Oral Daily  . diltiazem  120 mg Oral Daily  . irbesartan  300 mg Oral QHS  . pantoprazole  40 mg Oral Daily  . sertraline  25 mg Oral Daily  . sotalol  80 mg Oral Q12H   Continuous Infusions:  PRN Meds: acetaminophen, ondansetron (ZOFRAN) IV   Vital Signs    Vitals:   08/21/16 1344 08/21/16 2048 08/21/16 2250 08/22/16 0455  BP: 125/66 (!) 149/79 (!) 141/60 (!) 116/58  Pulse: (!) 55 (!) 55  (!) 54  Resp: 18 18 20  (!) 21  Temp: 98 F (36.7 C) 97.8 F (36.6 C)  97.7 F (36.5 C)  TempSrc: Oral Oral  Oral  SpO2: 96% 98%  97%  Weight:    249 lb 3.2 oz (113 kg)  Height:        Intake/Output Summary (Last 24 hours) at 08/22/16 0740 Last data filed at 08/22/16 0504  Gross per 24 hour  Intake             1140 ml  Output             1375 ml  Net             -235 ml   Filed Weights   08/20/16 0600 08/21/16 0300 08/22/16 0455  Weight: 239 lb 6.4 oz (108.6 kg) 250 lb 1.6 oz (113.4 kg) 249 lb 3.2 oz (113 kg)    Physical Exam    GEN: Overweight Caucasian male appearing in no acute distress.  HEENT: Grossly normal.  Neck: Supple, no JVD, carotid bruits, or masses. Cardiac: RRR, no murmurs, rubs, or gallops. No clubbing, cyanosis, edema.  Radials/DP/PT 2+ and equal bilaterally.  Respiratory:  Respirations  regular and unlabored, clear to auscultation bilaterally. GI: Soft, nontender, nondistended, BS + x 4. MS: no deformity or atrophy. Skin: warm and dry, no rash. Neuro:  Strength and sensation are intact. Psych: AAOx3.  Normal affect.  Labs    CBC  Recent Labs  08/19/16 1037 08/20/16 0540  WBC 11.0* 12.7*  HGB 15.7 13.4  HCT 45.5 40.7  MCV 84.3 86.0  PLT 295 276   Basic Metabolic Panel  Recent Labs  08/19/16 1241  08/21/16 0451 08/22/16 0358  NA  --   < > 138 139  K  --   < > 3.6 3.7  CL  --   < > 103 105  CO2  --   < > 23 23  GLUCOSE  --   < > 100* 101*  BUN  --   < > 20 19  CREATININE  --   < > 1.00 0.96  CALCIUM  --   < > 9.7 9.5  MG 2.1  --   --   --   < > = values  in this interval not displayed. Liver Function Tests No results for input(s): AST, ALT, ALKPHOS, BILITOT, PROT, ALBUMIN in the last 72 hours. No results for input(s): LIPASE, AMYLASE in the last 72 hours. Cardiac Enzymes No results for input(s): CKTOTAL, CKMB, CKMBINDEX, TROPONINI in the last 72 hours. BNP Invalid input(s): POCBNP D-Dimer No results for input(s): DDIMER in the last 72 hours. Hemoglobin A1C No results for input(s): HGBA1C in the last 72 hours. Fasting Lipid Panel No results for input(s): CHOL, HDL, LDLCALC, TRIG, CHOLHDL, LDLDIRECT in the last 72 hours. Thyroid Function Tests  Recent Labs  08/19/16 1942  TSH 3.869    Telemetry    Sinus bradycardia  ECG    Sinus bradycardia, HR 51, incomplete RBBB, with QTc of 492  Radiology    Dg Chest 2 View: Result Date: 08/19/2016 CLINICAL DATA:  Chest pain, shortness of breath. EXAM: CHEST  2 VIEW COMPARISON:  Radiograph of March 27, 2015. FINDINGS: Stable cardiomediastinal silhouette. Status post coronary artery bypass graft. Atherosclerosis of thoracic aorta is noted. No pneumothorax or pleural effusion is noted. Mild bibasilar subsegmental atelectasis is noted. Bony thorax is unremarkable. IMPRESSION: Aortic atherosclerosis.   Mild bibasilar subsegmental atelectasis. Electronically Signed   By: Lupita RaiderJames  Green Jr, M.D.   On: 08/19/2016 11:56   Cardiac Studies   Echocardiogram: 03/2015 - Left ventricle: The cavity size was normal. Wall thickness was   increased in a pattern of mild LVH. Systolic function was normal.   The estimated ejection fraction was in the range of 50% to 55%.   Wall motion was normal; there were no regional wall motion   abnormalities. Doppler parameters are consistent with high   ventricular filling pressure. - Left atrium: The atrium was mildly dilated.  Patient Profile     65 yo male w/ PMH of CAD (s/p CABG in 2004), HTN, HLD, OSA (on CPAP) and PAF who [facility] for palpitations, found to be in atrial fibrillation with RVR prior to spontaneously converting. Has been admitted for initiation of Sotalol.   Assessment & Plan    1. Paroxysmal Atrial Fibrillation/ Sotalol Monitoring - presented in atrial fibrillation with RVR. Has been having multiple episodes of this at home occurring to the patient. Has been admitted for initiation of antiarrhythmic therapy with Sotalol.  - This patients CHA2DS2-VASc Score and unadjusted Ischemic Stroke Rate (% per year) is equal to 2.2 % stroke rate/year from a score of 2 (HTN, Vascular). Continue Eliquis 5mg  BID.  - stable Sotalol 80mg  BID. QTc stable. Has reached steady state. Maintaining sinus.  2. CAD - s/p CABG in 2004. EKG without acute ischemic changes.  - continue statin therapy. No ASA secondary to need for Eliquis.  3. HTN - BP well-controlled in the past 24 hours.  - continue Avapro, Cardizem CD and chlorthalidone.  4. OSA  - continue CPAP  5. Anxiety - switched celexa to sertraline d/t QTc prolongation with the former.  Ok for d/c home today. Follow-up with Dr. Wyline MoodBranch in [place].  Chrystie NoseKenneth C. Maverick Dieudonne, MD, [facility]ACC Attending Cardiologist [facility]  Chrystie NoseKenneth C Skylier Kretschmer, MD  08/22/2016, 7:40 AM

## 2016-08-24 ENCOUNTER — Telehealth: Payer: Self-pay | Admitting: Cardiology

## 2016-08-24 NOTE — Telephone Encounter (Signed)
Pt says he feels wonderful much more energy - says HR has been WNL went to the gym for light workout and felt good. Pt confirmed 11/22 appt with [name] w/EKG

## 2016-08-24 NOTE — Telephone Encounter (Signed)
TRANSITION OF CARE - post-hospital, initiation of sotalol --> needs to be in 1 week.  Schedule 11/22 @ 8:20 with Dr Wyline Mood

## 2016-08-26 ENCOUNTER — Other Ambulatory Visit: Payer: Self-pay | Admitting: *Deleted

## 2016-08-26 ENCOUNTER — Ambulatory Visit (INDEPENDENT_AMBULATORY_CARE_PROVIDER_SITE_OTHER): Payer: Medicare HMO | Admitting: Cardiology

## 2016-08-26 ENCOUNTER — Telehealth: Payer: Self-pay | Admitting: *Deleted

## 2016-08-26 ENCOUNTER — Encounter: Payer: Self-pay | Admitting: Cardiology

## 2016-08-26 VITALS — BP 133/71 | HR 59 | Ht 66.0 in | Wt 252.0 lb

## 2016-08-26 DIAGNOSIS — I10 Essential (primary) hypertension: Secondary | ICD-10-CM | POA: Diagnosis not present

## 2016-08-26 DIAGNOSIS — I2510 Atherosclerotic heart disease of native coronary artery without angina pectoris: Secondary | ICD-10-CM

## 2016-08-26 DIAGNOSIS — I4891 Unspecified atrial fibrillation: Secondary | ICD-10-CM

## 2016-08-26 DIAGNOSIS — I480 Paroxysmal atrial fibrillation: Secondary | ICD-10-CM | POA: Diagnosis not present

## 2016-08-26 DIAGNOSIS — E782 Mixed hyperlipidemia: Secondary | ICD-10-CM | POA: Diagnosis not present

## 2016-08-26 MED ORDER — CHLORTHALIDONE 25 MG PO TABS: 37.5000 mg | ORAL_TABLET | Freq: Every day | ORAL | 3 refills | Status: DC

## 2016-08-26 MED ORDER — SOTALOL HCL 80 MG PO TABS: 80.0000 mg | ORAL_TABLET | Freq: Two times a day (BID) | ORAL | 3 refills | Status: DC

## 2016-08-26 MED ORDER — METOPROLOL TARTRATE 25 MG PO TABS: 25.0000 mg | ORAL_TABLET | Freq: Three times a day (TID) | ORAL | 3 refills | Status: DC | PRN

## 2016-08-26 MED ORDER — APIXABAN 5 MG PO TABS: 5.0000 mg | ORAL_TABLET | Freq: Two times a day (BID) | ORAL | 0 refills | Status: DC

## 2016-08-26 NOTE — Progress Notes (Signed)
Clinical Summary [name] is a 65 y.o.male seen today for follow up of the followig medical problems.   1. HTN   - home bp's 140s-160s/90s - had gynecomastia on aldactone - beta blocker, stopped, dilt dose lowered during recent admission.   2. CAD  - [facility]  - [place] with [place] grafts. Reports negative stress test in 2011 [place] Dr [name] [name] in [place]. 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   3. Hyperlipidemia  - noted some side effects on higher lipitor doses, has tolerated lower dose well - compliant with statin  4. OSA  - compliant with CPAP  5. Afib - recent admission with afib with RVR - was started on sotalol, tolerated well.  - beta blocker stopped, dilt lowered due to bradycardia during admission. QTc 500 at discharge.   - isolated episode of palpitations, lasted about 5 minutes then resolved.    Past Medical History:  Diagnosis Date  . Anxiety   . Chronic chest pain   . Chronic sinus bradycardia   . Coronary artery disease    2v CABG, 2004 [facility]  . DJD (degenerative joint disease)   . Edema   . HLD (hyperlipidemia)   . Hypertension   . MI, old 2004  . OA (osteoarthritis)   . Obesity   . PAF (paroxysmal atrial fibrillation) (HCC)    a. started on sotalol 08/2016.  Marland Kitchen. Palpitations    Reported history of PVCs  . QT prolongation   . Sleep apnea    on CPAP  . Spinal stenosis      Allergies  Allergen Reactions  . Nsaids Other (See Comments)    On blood thinner  . Vancomycin Other (See Comments)    Was informed after heart surgery ? Breathing problem/patient unsure     Current Outpatient Prescriptions  Medication Sig Dispense Refill  . acetaminophen (TYLENOL) 650 MG CR tablet Take 650 mg by mouth every 8 (eight) hours as needed for pain.     Marland Kitchen.  allopurinol (ZYLOPRIM) 100 MG tablet Take 100 mg by mouth daily as needed (gout).     Marland Kitchen. apixaban (ELIQUIS) 5 MG TABS tablet Take 1 tablet (5 mg total) by mouth 2 (two) times daily. 56 tablet 0  . atorvastatin (LIPITOR) 20 MG tablet Take 1 tablet (20 mg total) by mouth daily. 90 tablet 3  . chlorthalidone (HYGROTON) 25 MG tablet Take 1 tablet (25 mg total) by mouth daily. 90 tablet 3  . Coenzyme Q10 (CO Q 10) 100 MG CAPS Take 400 mg by mouth daily.    Marland Kitchen. diltiazem (CARDIZEM CD) 120 MG 24 hr capsule Take 1 capsule (120 mg total) by mouth daily. 30 capsule 6  . fish oil-omega-3 fatty acids 1000 MG capsule Take 3 g by mouth 2 (two) times daily.     . irbesartan (AVAPRO) 300 MG tablet Take 1 tablet (300 mg total) by mouth at bedtime. 90 tablet 3  . Multiple Vitamin (MULTIVITAMIN) tablet Take 1 tablet by mouth daily.    Marland Kitchen. omeprazole (PRILOSEC) 20 MG capsule Take 20 mg by mouth every other day.     . sertraline (ZOLOFT) 25 MG tablet Take 1 tablet (25 mg total) by mouth daily. 30 tablet 0  . sotalol (BETAPACE) 80 MG tablet Take 1 tablet (80  mg total) by mouth every 12 (twelve) hours. 60 tablet 2   No current facility-administered medications for this visit.      Past Surgical History:  Procedure Laterality Date  . BACK SURGERY  2011  . CARPAL TUNNEL RELEASE     bilateral  . CORONARY ARTERY BYPASS GRAFT     2004  . fistula surgery    . hemorrhoidectomy    . LUMBAR LAMINECTOMY    . right foot fracture    . TRANSTHORACIC ECHOCARDIOGRAM  08/25/2010   EF 55-60%     Allergies  Allergen Reactions  . Nsaids Other (See Comments)    On blood thinner  . Vancomycin Other (See Comments)    Was informed after heart surgery ? Breathing problem/patient unsure      Family History  Problem Relation Age of Onset  . Breast cancer Mother   . Hypertension Mother   . Coronary artery disease Father   . Stroke Father   . Hypertension Father      Social History [name] reports that he has  never smoked. He has never used smokeless tobacco. [name] reports that he does not drink alcohol.   Review of Systems CONSTITUTIONAL: No weight loss, fever, chills, weakness or fatigue.  HEENT: Eyes: No visual loss, blurred vision, double vision or yellow sclerae.No hearing loss, sneezing, congestion, runny nose or sore throat.  SKIN: No rash or itching.  CARDIOVASCULAR: per hpi RESPIRATORY: No shortness of breath, cough or sputum.  GASTROINTESTINAL: No anorexia, nausea, vomiting or diarrhea. No abdominal pain or blood.  GENITOURINARY: No burning on urination, no polyuria NEUROLOGICAL: No headache, dizziness, syncope, paralysis, ataxia, numbness or tingling in the extremities. No change in bowel or bladder control.  MUSCULOSKELETAL: No muscle, back pain, joint pain or stiffness.  LYMPHATICS: No enlarged nodes. No history of splenectomy.  PSYCHIATRIC: No history of depression or anxiety.  ENDOCRINOLOGIC: No reports of sweating, cold or heat intolerance. No polyuria or polydipsia.  Marland Kitchen   Physical Examination Vitals:   08/26/16 0818  BP: 133/71  Pulse: (!) 59   Vitals:   08/26/16 0818  Weight: 252 lb (114.3 kg)  Height: 5\' 6"  (1.676 m)    Gen: resting comfortably, no acute distress HEENT: no scleral icterus, pupils equal round and reactive, no palptable cervical adenopathy,  CV: RRR, no m/r/g no jvd Resp: Clear to auscultation bilaterally GI: abdomen is soft, non-tender, non-distended, normal bowel sounds, no hepatosplenomegaly MSK: extremities are warm, no edema.  Skin: warm, no rash Neuro:  no focal deficits Psych: appropriate affect   Diagnostic Studies 03/2015 echo Study Conclusions  - Left ventricle: The cavity size was normal. Wall thickness was increased in a pattern of mild LVH. Systolic function was normal. The estimated ejection fraction was in the range of 50% to 55%. Wall motion was normal; there were no regional wall motion abnormalities. Doppler  parameters are consistent with high ventricular filling pressure. - Left atrium: The atrium was mildly dilated.   03/2015 Lexiscan MPI  No T wave inversion was noted during stress.  There was no ST segment deviation noted during stress.  The study is normal.  This is a low risk study.  The left ventricular ejection fraction is normal (55-65%).  Nuclear stress EF: 61%.    Assessment and Plan  1. HTN - elevated bp's, we will increase chlorthalidone to 37.5mg  daily. Check BMET/Mg in 2 weeks   2. CAD  - no current symptoms  - he will continue current  meds  3. Hyperlipidemia  - continue current statin, did not tolerate higher dose statin  4. OSA  - continue CPAP  5. Afib - recently started on sotalol, overall doing well. Continue current meds  - we will continue current meds. CHADS2Vasc score of 2, continue eliquis (HTN, CAD) - EKG shows SR, stable QTc      Antoine Poche, M.D.

## 2016-08-26 NOTE — Telephone Encounter (Signed)
Pt thinks he is back in A-fib - says HR 113-116 with some chest pain - he took PM dose of sotalol HR went to 80 then went back to 116 but no chest pain after taking sotalol - per [name] sent Lopressor 25mg  q8h prn and made appt 11/28 w/Afib clinic. Pt aware and given directions, phone, and gate code. Pt also aware that if symptoms worsen to report to ER.

## 2016-08-26 NOTE — Patient Instructions (Signed)
Your physician recommends that you schedule a follow-up appointment in: 3 MONTHS WITH DR. BRANCH   Your physician has recommended you make the following change in your medication:   INCREASE CHLORTHALIDONE 37.5 MG DAILY  Your physician recommends that you return for lab work in: 2 WEEKS SMP/CBC/MG  Your physician has requested that you regularly monitor and record your blood pressure readings at home FOR 2 WEEKS AND CALL us WITH READINGS. Please use the same machine at the same time of day to check your readings and record them to bring to your follow-up visit.  Thank you for choosing [facility]!!

## 2016-09-01 ENCOUNTER — Ambulatory Visit (HOSPITAL_COMMUNITY)
Admission: RE | Admit: 2016-09-01 | Discharge: 2016-09-01 | Disposition: A | Discharge status: Home / Self Care | Payer: Medicare HMO | Source: Ambulatory Visit | Attending: Nurse Practitioner | Admitting: Nurse Practitioner

## 2016-09-01 ENCOUNTER — Other Ambulatory Visit: Payer: Self-pay | Admitting: Cardiology

## 2016-09-01 ENCOUNTER — Encounter (HOSPITAL_COMMUNITY): Payer: Self-pay | Admitting: Nurse Practitioner

## 2016-09-01 VITALS — BP 136/80 | HR 53 | Ht 66.0 in | Wt 253.4 lb

## 2016-09-01 DIAGNOSIS — I4510 Unspecified right bundle-branch block: Secondary | ICD-10-CM | POA: Insufficient documentation

## 2016-09-01 DIAGNOSIS — I4891 Unspecified atrial fibrillation: Secondary | ICD-10-CM | POA: Diagnosis not present

## 2016-09-01 DIAGNOSIS — R001 Bradycardia, unspecified: Secondary | ICD-10-CM | POA: Diagnosis not present

## 2016-09-01 DIAGNOSIS — I480 Paroxysmal atrial fibrillation: Secondary | ICD-10-CM

## 2016-09-01 DIAGNOSIS — R9431 Abnormal electrocardiogram [ECG] [EKG]: Secondary | ICD-10-CM | POA: Diagnosis not present

## 2016-09-01 LAB — CBC
HCT: 44.2 % (ref 38.5–50.0)
HEMOGLOBIN: 14.7 g/dL (ref 13.2–17.1)
MCH: 28.6 pg (ref 27.0–33.0)
MCHC: 33.3 g/dL (ref 32.0–36.0)
MCV: 86 fL (ref 80.0–100.0)
MPV: 10.5 fL (ref 7.5–12.5)
PLATELETS: 332 10*3/uL (ref 140–400)
RBC: 5.14 MIL/uL (ref 4.20–5.80)
RDW: 14.1 % (ref 11.0–15.0)
WBC: 8.8 10*3/uL (ref 3.8–10.8)

## 2016-09-01 LAB — BASIC METABOLIC PANEL
BUN: 21 mg/dL (ref 7–25)
CALCIUM: 9.9 mg/dL (ref 8.6–10.3)
CHLORIDE: 106 mmol/L (ref 98–110)
CO2: 25 mmol/L (ref 20–31)
Creat: 1.02 mg/dL (ref 0.70–1.25)
GLUCOSE: 101 mg/dL — AB (ref 65–99)
POTASSIUM: 4.7 mmol/L (ref 3.5–5.3)
SODIUM: 139 mmol/L (ref 135–146)

## 2016-09-01 LAB — MAGNESIUM: Magnesium: 2 mg/dL (ref 1.5–2.5)

## 2016-09-01 NOTE — Progress Notes (Signed)
Primary Care Physician: No primary care provider on file. Referring Physician: Dr. Clide Deutscher is a 64 y.o. male with a h/o CAD status post 2 vessel CABG surgery in 2004 at [facility] with low risk nuclear stress test in 2016, paroxysmal atrial fibrillation, obstructive sleep apnea fully compliant with CPAP, HTN, HLD, sinus bradycardia, obesity, spinal stenosis, presented to [facility] with chest pain and tachypalpitations. He was found to be back in atrial fib with RVR and converted to NSR in the ED. His EKG was without ischemic changes. He was admitted for initiation of antiarrhythmic therapy with sotalol at 80mg  BID. His Toprol was stopped to allow for this. His diltiazem dose was decreased for sinus bradycardia. TSH was normal. Mg was 2.1. Potassium ranged 3.7-4.1.  Eliquis was continued. Not on ASA due to need for Eliquis and no active ACS. His celexa was switched to sertraline due to QTc prolongation with the former. Morning of d/c, he was maintaining NSR with QTc 500 msec.  He is in the afib clinic for f/u. He has had 3 episodes of afib since d/c. The shortest was 5 mins and the longest was almost 3 hours. Usually will return to SR  with a as needed metoprolol. He does have CPAP that he uses religiously. Occasional beer. Exercises 3x a week on a regular basis at the gym but struggles to lose weight.  Today, he denies symptoms of palpitations, chest pain, shortness of breath, orthopnea, PND, lower extremity edema, dizziness, presyncope, syncope, or neurologic sequela. The patient is tolerating medications without difficulties and is otherwise without complaint today.   Past Medical History:  Diagnosis Date  . Anxiety   . Chronic chest pain   . Chronic sinus bradycardia   . Coronary artery disease    2v CABG, 2004 [facility])  . DJD (degenerative joint disease)   . Edema   . HLD (hyperlipidemia)   . Hypertension   . MI, old 2004  . OA (osteoarthritis)   . Obesity   . PAF (paroxysmal  atrial fibrillation) (HCC)    a. started on sotalol 08/2016.  Marland Kitchen Palpitations    Reported history of PVCs  . QT prolongation   . Sleep apnea    on CPAP  . Spinal stenosis    Past Surgical History:  Procedure Laterality Date  . BACK SURGERY  2011  . CARPAL TUNNEL RELEASE     bilateral  . CORONARY ARTERY BYPASS GRAFT     2004  . fistula surgery    . hemorrhoidectomy    . LUMBAR LAMINECTOMY    . right foot fracture    . TRANSTHORACIC ECHOCARDIOGRAM  08/25/2010   EF 55-60%    Current Outpatient Prescriptions  Medication Sig Dispense Refill  . acetaminophen (TYLENOL) 650 MG CR tablet Take 650 mg by mouth every 8 (eight) hours as needed for pain.     Marland Kitchen apixaban (ELIQUIS) 5 MG TABS tablet Take 1 tablet (5 mg total) by mouth 2 (two) times daily. 56 tablet 0  . atorvastatin (LIPITOR) 20 MG tablet Take 1 tablet (20 mg total) by mouth daily. 90 tablet 3  . chlorthalidone (HYGROTON) 25 MG tablet Take 1.5 tablets (37.5 mg total) by mouth daily. 45 tablet 3  . Coenzyme Q10 (CO Q 10) 100 MG CAPS Take 400 mg by mouth daily.    Marland Kitchen diltiazem (CARDIZEM CD) 120 MG 24 hr capsule Take 1 capsule (120 mg total) by mouth daily. 30 capsule 6  .  fish oil-omega-3 fatty acids 1000 MG capsule Take 3 g by mouth 2 (two) times daily.     . irbesartan (AVAPRO) 300 MG tablet Take 1 tablet (300 mg total) by mouth at bedtime. 90 tablet 3  . metoprolol tartrate (LOPRESSOR) 25 MG tablet Take 1 tablet (25 mg total) by mouth every 8 (eight) hours as needed. 180 tablet 3  . Multiple Vitamin (MULTIVITAMIN) tablet Take 1 tablet by mouth daily.    Marland Kitchen. omeprazole (PRILOSEC) 20 MG capsule Take 20 mg by mouth every other day.     . sertraline (ZOLOFT) 25 MG tablet Take 1 tablet (25 mg total) by mouth daily. 30 tablet 0  . sotalol (BETAPACE) 80 MG tablet Take 1 tablet (80 mg total) by mouth every 12 (twelve) hours. 60 tablet 3   No current facility-administered medications for this encounter.     Allergies  Allergen  Reactions  . Nsaids Other (See Comments)    On blood thinner  . Vancomycin Other (See Comments)    Was informed after heart surgery ? Breathing problem/patient unsure    Social History   Social History  . Marital status: Married    Spouse name: [name]  . Number of children: 0  . 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 Smoking status: Never Smoker  . Smokeless tobacco: Never Used  . Alcohol use No  . Drug use: No  . Sexual activity: Yes   Other Topics Concern  . Not on file   Social History Narrative  . No narrative on file    Family History  Problem Relation Age of Onset  . Breast cancer Mother   . Hypertension Mother   . Coronary artery disease Father   . Stroke Father   . Hypertension Father     ROS- All systems are reviewed and negative except as per the HPI above  Physical Exam: Vitals:   09/01/16 0919  BP: 136/80  Pulse: (!) 53  Weight: 253 lb 6.4 oz (114.9 kg)  Height: 5\' 6"  (1.676 m)    GEN- The patient is well appearing, alert and oriented x 3 today.   Head- normocephalic, atraumatic Eyes-  Sclera clear, conjunctiva pink Ears- hearing intact Oropharynx- clear Neck- supple, no JVP Lymph- no cervical lymphadenopathy Lungs- Clear to ausculation bilaterally, normal work of breathing Heart- Regular rate and rhythm, no murmurs, rubs or gallops, PMI not laterally displaced GI- soft, NT, ND, + BS Extremities- no clubbing, cyanosis, or edema MS- no significant deformity or atrophy Skin- no rash or lesion Psych- euthymic mood, full affect Neuro- strength and sensation are intact  EKG-Sinus brady at 53 bpm, IRBBB, Pr int 168 ms, qrs int 102 ms, qtc 491 ms(stable) Epic records reviewed  Assessment and Plan: 1. Paroxysmal afib Recently loaded on sotalol with a few breakthrough episodes but not that rapid or sustained Continue sotalol, doubt could increase dose due to qtc  near 500 ms.  Continue to use as needed metoprolol for breakthrough episodes Discussed next step if afib burden increases, he is not interested in ablation at this time  2. Lifestyle issues Congratulated on regular gym schedule Try to lose 5-10% of current body weight Decrease beer to no more than 2 a week Continue regular use of cpap  F/u with Dr. Wyline MoodBranch 2/28 afib clinic as needed

## 2016-09-08 ENCOUNTER — Encounter (HOSPITAL_COMMUNITY): Payer: Self-pay | Admitting: Pharmacy Technician

## 2016-09-08 ENCOUNTER — Inpatient Hospital Stay (HOSPITAL_COMMUNITY)
Admission: EM | Admit: 2016-09-08 | Discharge: 2016-09-12 | DRG: 309 | Disposition: A | Discharge status: Home / Self Care | Payer: MEDICARE | Attending: Internal Medicine | Admitting: Internal Medicine

## 2016-09-08 ENCOUNTER — Emergency Department (HOSPITAL_COMMUNITY): Payer: MEDICARE

## 2016-09-08 DIAGNOSIS — Z8249 Family history of ischemic heart disease and other diseases of the circulatory system: Secondary | ICD-10-CM

## 2016-09-08 DIAGNOSIS — Z9989 Dependence on other enabling machines and devices: Secondary | ICD-10-CM

## 2016-09-08 DIAGNOSIS — E785 Hyperlipidemia, unspecified: Secondary | ICD-10-CM | POA: Diagnosis not present

## 2016-09-08 DIAGNOSIS — I25119 Atherosclerotic heart disease of native coronary artery with unspecified angina pectoris: Secondary | ICD-10-CM | POA: Diagnosis not present

## 2016-09-08 DIAGNOSIS — I252 Old myocardial infarction: Secondary | ICD-10-CM

## 2016-09-08 DIAGNOSIS — M1990 Unspecified osteoarthritis, unspecified site: Secondary | ICD-10-CM | POA: Diagnosis present

## 2016-09-08 DIAGNOSIS — Z951 Presence of aortocoronary bypass graft: Secondary | ICD-10-CM | POA: Diagnosis not present

## 2016-09-08 DIAGNOSIS — I4891 Unspecified atrial fibrillation: Secondary | ICD-10-CM | POA: Diagnosis present

## 2016-09-08 DIAGNOSIS — G4733 Obstructive sleep apnea (adult) (pediatric): Secondary | ICD-10-CM | POA: Diagnosis present

## 2016-09-08 DIAGNOSIS — M4800 Spinal stenosis, site unspecified: Secondary | ICD-10-CM | POA: Diagnosis present

## 2016-09-08 DIAGNOSIS — Z6841 Body Mass Index (BMI) 40.0 and over, adult: Secondary | ICD-10-CM

## 2016-09-08 DIAGNOSIS — I2510 Atherosclerotic heart disease of native coronary artery without angina pectoris: Secondary | ICD-10-CM | POA: Diagnosis present

## 2016-09-08 DIAGNOSIS — D72829 Elevated white blood cell count, unspecified: Secondary | ICD-10-CM | POA: Diagnosis not present

## 2016-09-08 DIAGNOSIS — I119 Hypertensive heart disease without heart failure: Secondary | ICD-10-CM | POA: Diagnosis present

## 2016-09-08 DIAGNOSIS — K219 Gastro-esophageal reflux disease without esophagitis: Secondary | ICD-10-CM | POA: Diagnosis present

## 2016-09-08 DIAGNOSIS — M25512 Pain in left shoulder: Secondary | ICD-10-CM | POA: Diagnosis not present

## 2016-09-08 DIAGNOSIS — I480 Paroxysmal atrial fibrillation: Principal | ICD-10-CM | POA: Diagnosis present

## 2016-09-08 DIAGNOSIS — R001 Bradycardia, unspecified: Secondary | ICD-10-CM | POA: Diagnosis present

## 2016-09-08 DIAGNOSIS — Z79899 Other long term (current) drug therapy: Secondary | ICD-10-CM

## 2016-09-08 DIAGNOSIS — F419 Anxiety disorder, unspecified: Secondary | ICD-10-CM | POA: Diagnosis not present

## 2016-09-08 DIAGNOSIS — Z881 Allergy status to other antibiotic agents status: Secondary | ICD-10-CM

## 2016-09-08 DIAGNOSIS — Z886 Allergy status to analgesic agent status: Secondary | ICD-10-CM

## 2016-09-08 DIAGNOSIS — R079 Chest pain, unspecified: Secondary | ICD-10-CM | POA: Diagnosis not present

## 2016-09-08 DIAGNOSIS — M79602 Pain in left arm: Secondary | ICD-10-CM | POA: Diagnosis not present

## 2016-09-08 DIAGNOSIS — Z981 Arthrodesis status: Secondary | ICD-10-CM

## 2016-09-08 DIAGNOSIS — Z7901 Long term (current) use of anticoagulants: Secondary | ICD-10-CM

## 2016-09-08 DIAGNOSIS — I4892 Unspecified atrial flutter: Secondary | ICD-10-CM | POA: Diagnosis present

## 2016-09-08 DIAGNOSIS — Z8679 Personal history of other diseases of the circulatory system: Secondary | ICD-10-CM

## 2016-09-08 DIAGNOSIS — I10 Essential (primary) hypertension: Secondary | ICD-10-CM | POA: Diagnosis present

## 2016-09-08 DIAGNOSIS — I4510 Unspecified right bundle-branch block: Secondary | ICD-10-CM | POA: Diagnosis present

## 2016-09-08 HISTORY — DX: Typical atrial flutter: I48.3

## 2016-09-08 LAB — COMPREHENSIVE METABOLIC PANEL
ALK PHOS: 74 U/L (ref 38–126)
ALT: 27 U/L (ref 17–63)
ANION GAP: 9 (ref 5–15)
AST: 27 U/L (ref 15–41)
Albumin: 3.4 g/dL — ABNORMAL LOW (ref 3.5–5.0)
BUN: 15 mg/dL (ref 6–20)
CALCIUM: 9.8 mg/dL (ref 8.9–10.3)
CHLORIDE: 106 mmol/L (ref 101–111)
CO2: 23 mmol/L (ref 22–32)
CREATININE: 1.13 mg/dL (ref 0.61–1.24)
Glucose, Bld: 133 mg/dL — ABNORMAL HIGH (ref 65–99)
Potassium: 4.3 mmol/L (ref 3.5–5.1)
Sodium: 138 mmol/L (ref 135–145)
Total Bilirubin: 0.5 mg/dL (ref 0.3–1.2)
Total Protein: 7.9 g/dL (ref 6.5–8.1)

## 2016-09-08 LAB — I-STAT TROPONIN, ED: Troponin i, poc: 0.01 ng/mL (ref 0.00–0.08)

## 2016-09-08 LAB — URINALYSIS, ROUTINE W REFLEX MICROSCOPIC
BILIRUBIN URINE: NEGATIVE
Glucose, UA: NEGATIVE mg/dL
Hgb urine dipstick: NEGATIVE
KETONES UR: NEGATIVE mg/dL
Leukocytes, UA: NEGATIVE
NITRITE: NEGATIVE
Protein, ur: NEGATIVE mg/dL
Specific Gravity, Urine: 1.013 (ref 1.005–1.030)
pH: 6 (ref 5.0–8.0)

## 2016-09-08 LAB — CBC
HCT: 43.2 % (ref 39.0–52.0)
Hemoglobin: 15.2 g/dL (ref 13.0–17.0)
MCH: 29.6 pg (ref 26.0–34.0)
MCHC: 35.2 g/dL (ref 30.0–36.0)
MCV: 84.2 fL (ref 78.0–100.0)
PLATELETS: 337 10*3/uL (ref 150–400)
RBC: 5.13 MIL/uL (ref 4.22–5.81)
RDW: 13.6 % (ref 11.5–15.5)
WBC: 13.4 10*3/uL — AB (ref 4.0–10.5)

## 2016-09-08 LAB — APTT: APTT: 34 s (ref 24–36)

## 2016-09-08 LAB — PROTIME-INR
INR: 1.13
Prothrombin Time: 14.6 seconds (ref 11.4–15.2)

## 2016-09-08 NOTE — ED Provider Notes (Signed)
[facility]-EMERGENCY DEPT Provider Note   CSN: [id number] Arrival date & time: 09/08/16  2055  History   Chief Complaint Chief Complaint  Patient presents with  . Chest Pain    HPI [name] [name] is a 65 y.o. male.  HPI  Primary Cardiologist: [name] Hx of CABG in 2004  Atrial fibrillation [facility]) diagnosed in 08/2016 Active Problems:   Obstructive sleep apnea   Coronary atherosclerosis   Patient comes to there ER with complaints of CP, feeling as if his chest would explode, that started around 6 pm. He had a low risk nuclear stress test in 2016. Today he was feeling poorly and having pain. On arrival to the ED his pain subsided as well as being in NSR on his initial EKG. He was on Metoprolol, Eliquis and Cardizem but they d/c'd his Metoprolol on 11/17 during his admission and recommended he start it PRN a fib episode and started him on Sotalol. He had 3 severe episodes today and he now feels fatigued. He denies being diaphoretic, nauseous or have syncope. He did feel some radiation down his arm but believes it could be from riding his riding lawn mower earlier today to cut the grass.   Past Medical History:  Diagnosis Date  . Anxiety   . Chronic chest pain   . Chronic sinus bradycardia   . Coronary artery disease    2v CABG, 2004 [facility])  . DJD (degenerative joint disease)   . Edema   . HLD (hyperlipidemia)   . Hypertension   . MI, old 2004  . OA (osteoarthritis)   . Obesity   . PAF (paroxysmal atrial fibrillation) (HCC)    a. started on sotalol 08/2016.  Marland Kitchen Palpitations    Reported history of PVCs  . QT prolongation   . Sleep apnea    on CPAP  . Spinal stenosis     Patient Active Problem List   Diagnosis Date Noted  . Chest pain 09/09/2016  . Leukocytosis 09/09/2016  . Obesity 08/22/2016  . Anxiety 08/22/2016  . Encounter for monitoring sotalol therapy 08/20/2016  . Paroxysmal atrial fibrillation (HCC) 08/19/2016  . Morbidly obese (HCC) 07/12/2016  . Medical  non-compliance 07/12/2016  . Elevated TSH 07/09/2016  . BPH without urinary obstruction 07/09/2016  . Need for hepatitis C screening test 07/09/2016  . Idiopathic gout 07/09/2016  . Hyperglycemia 08/01/2015  . Atrial fibrillation with RVR (HCC) 03/28/2015  . Routine general medical examination at a health care facility 08/11/2013  . Chronic sinus bradycardia   . Obstructive sleep apnea 09/12/2010  . MYOCARDIAL INFARCTION 09/11/2010  . Spinal stenosis, unspecified region other than cervical 09/11/2010  . Hyperlipidemia with target LDL less than 70 09/08/2010  . Depression 09/08/2010  . Essential hypertension 09/08/2010  . Coronary atherosclerosis 09/08/2010  . Congestive heart failure (HCC) 09/08/2010  . GERD 09/08/2010   Past Surgical History:  Procedure Laterality Date  . BACK SURGERY  2011  . CARPAL TUNNEL RELEASE     bilateral  . CORONARY ARTERY BYPASS GRAFT     2004  . fistula surgery    . hemorrhoidectomy    . LUMBAR LAMINECTOMY    . right foot fracture    . TRANSTHORACIC ECHOCARDIOGRAM  08/25/2010   EF 55-60%    Home Medications    Prior to Admission medications   Medication Sig Start Date End Date Taking? Authorizing Provider  acetaminophen (TYLENOL) 650 MG CR tablet Take 650 mg by mouth every 8 (eight) hours as needed  for pain.    Yes Historical Provider, MD  allopurinol (ZYLOPRIM) 100 MG tablet Take 100 mg by mouth daily as needed (for gout).   Yes Historical Provider, MD  apixaban (ELIQUIS) 5 MG TABS tablet Take 1 tablet (5 mg total) by mouth 2 (two) times daily. 08/26/16  Yes Antoine PocheJonathan F Branch, MD  atorvastatin (LIPITOR) 20 MG tablet Take 1 tablet (20 mg total) by mouth daily. 02/06/16  Yes Antoine PocheJonathan F Branch, MD  chlorthalidone (HYGROTON) 25 MG tablet Take 1.5 tablets (37.5 mg total) by mouth daily. 08/26/16 11/24/16 Yes Antoine PocheJonathan F Branch, MD  Coenzyme Q10 (CO Q 10) 100 MG CAPS Take 400 mg by mouth daily.   Yes Historical Provider, MD  diltiazem (CARDIZEM CD) 120  MG 24 hr capsule Take 1 capsule (120 mg total) by mouth daily. 08/22/16  Yes Dayna N Dunn, PA-C  fish oil-omega-3 fatty acids 1000 MG capsule Take 3 g by mouth 2 (two) times daily.    Yes Historical Provider, MD  irbesartan (AVAPRO) 300 MG tablet Take 1 tablet (300 mg total) by mouth at bedtime. 07/23/16 07/23/17 Yes Antoine PocheJonathan F Branch, MD  Multiple Vitamin (MULTIVITAMIN) tablet Take 1 tablet by mouth daily.   Yes Historical Provider, MD  omeprazole (PRILOSEC) 20 MG capsule Take 20 mg by mouth every other day.    Yes Historical Provider, MD  sertraline (ZOLOFT) 25 MG tablet Take 1 tablet (25 mg total) by mouth daily. 08/22/16  Yes Dayna N Dunn, PA-C  sotalol (BETAPACE) 80 MG tablet Take 1 tablet (80 mg total) by mouth every 12 (twelve) hours. 08/26/16  Yes Antoine PocheJonathan F Branch, MD  metoprolol tartrate (LOPRESSOR) 25 MG tablet Take 1 tablet (25 mg total) by mouth every 8 (eight) hours as needed. Patient not taking: Reported on 09/08/2016 08/26/16   Antoine PocheJonathan F Branch, MD   Family History Family History  Problem Relation Age of Onset  . Breast cancer Mother   . Hypertension Mother   . Coronary artery disease Father   . Stroke Father   . Hypertension Father    Social History Social History  Substance Use Topics  . Smoking status: Never Smoker  . Smokeless tobacco: Never Used  . Alcohol use No   Allergies   Nsaids and Vancomycin   Review of Systems Review of Systems Review of Systems All other systems negative except as documented in the HPI. All pertinent positives and negatives as reviewed in the HPI.   Physical Exam Updated Vital Signs BP 125/80   Pulse 79   Temp 97.5 F (36.4 C) (Oral)   Resp 17   Ht 5\' 6"  (1.676 m)   Wt 113.4 kg   SpO2 96%   BMI 40.35 kg/m   Physical Exam  Constitutional: He appears well-developed and well-nourished. No distress.  HENT:  Head: Normocephalic and atraumatic.  Nose: Nose normal.  Mouth/Throat: Uvula is midline, oropharynx is clear and  moist and mucous membranes are normal.  Eyes: Pupils are equal, round, and reactive to light.  Neck: Normal range of motion. Neck supple.  Cardiovascular: Normal rate.  An irregularly irregular rhythm present.  Pulmonary/Chest: Effort normal.  Abdominal: Soft.  No signs of abdominal distention  Musculoskeletal:  No LE swelling  Neurological: He is alert.  Acting at baseline  Skin: Skin is warm and dry. No rash noted.  Nursing note and vitals reviewed.    ED Treatments / Results  Labs (all labs ordered are listed, but only abnormal results are displayed) Labs Reviewed  COMPREHENSIVE METABOLIC PANEL - Abnormal; Notable for the following:       Result Value   Glucose, Bld 133 (*)    Albumin 3.4 (*)    All other components within normal limits  CBC - Abnormal; Notable for the following:    WBC 13.4 (*)    All other components within normal limits  APTT  PROTIME-INR  URINALYSIS, ROUTINE W REFLEX MICROSCOPIC  I-STAT TROPOININ, ED    EKG  EKG Interpretation  Date/Time:  Tuesday September 08 2016 20:57:59 EST Ventricular Rate:  57 PR Interval:  160 QRS Duration: 92 QT Interval:  462 QTC Calculation: 449 R Axis:   32 Text Interpretation: Sinus bradycardia with occasional Premature ventricular complexes Incomplete right bundle branch block Possible Inferior infarct , age undetermined Abnormal ECG No significant change since last tracing Confirmed by KNAPP  MD-J, JON [phone]) on 09/08/2016 9:56:21 PM      Radiology Dg Chest 2 View  Result Date: 09/08/2016 CLINICAL DATA:  Chest pain and dyspnea today. History CABG in atrial fibrillation. EXAM: CHEST  2 VIEW COMPARISON:  08/19/2016 FINDINGS: The heart is enlarged. The patient is status post median sternotomy. Mild interstitial prominence without overt pulmonary edema, pneumonic consolidation, effusion or pneumothorax. Minimal atelectasis at the lung bases left greater right. No acute osseous abnormality. IMPRESSION: Bibasilar  subsegmental atelectasis with stable cardiomegaly. Aortic atherosclerosis.  No significant change from prior. Electronically Signed   By: Tollie Eth M.D.   On: 09/08/2016 22:22    Procedures Procedures (including critical care time)  Medications Ordered in ED Medications - No data to display   Initial Impression / Assessment and Plan / ED Course  I have reviewed the triage vital signs and the nursing notes.  Pertinent labs & imaging results that were available during my care of the patient were reviewed by me and considered in my medical decision making (see chart for details).  Clinical Course     Pt is pain free in the ED- first Troponin is negative. Bloodwork/chest xray otherwise unremarkable.  I discussed case with Attending who recommend touching base with cardiology. I spoke with Dr. Kym Groom who recommends hospitalist cardiac obs due to severe CP. Since a fib is paroxysmal at the time cardioverting is not advised.   Dr. Antionette Char, MCadmits, obs, Tele, Tr[facility] hospitalist  Final Clinical Impressions(s) / ED Diagnoses   Final diagnoses:  Chest pain, unspecified type    New Prescriptions New Prescriptions   No medications on file     Marlon Pel, Cordelia Poche 09/09/16 0018    Linwood Dibbles, MD 09/09/16 [phone]

## 2016-09-08 NOTE — ED Triage Notes (Signed)
Pt reports to the ED with reports with Left sided chest pain that started today approx 1430. Pt reports feeling like his chest was going to explode and feeling his heart skip beats. Pt also reports L shoulder, L arm and neck pain that started the same time. Pt reports taking Metoprolol 25mg  approx 1700 and repeating a dose at 1930. Pt pain has since lessened but has not gone away. Pt with hx of hyperlipidemia, hypertension and previous CABG in 2004. Pt also newly diagnosed with AFIB.

## 2016-09-09 ENCOUNTER — Telehealth: Payer: Self-pay | Admitting: *Deleted

## 2016-09-09 ENCOUNTER — Encounter (HOSPITAL_COMMUNITY): Payer: Self-pay | Admitting: Family Medicine

## 2016-09-09 DIAGNOSIS — I10 Essential (primary) hypertension: Secondary | ICD-10-CM | POA: Diagnosis not present

## 2016-09-09 DIAGNOSIS — R079 Chest pain, unspecified: Secondary | ICD-10-CM | POA: Diagnosis not present

## 2016-09-09 DIAGNOSIS — K219 Gastro-esophageal reflux disease without esophagitis: Secondary | ICD-10-CM

## 2016-09-09 DIAGNOSIS — R001 Bradycardia, unspecified: Secondary | ICD-10-CM

## 2016-09-09 DIAGNOSIS — D72829 Elevated white blood cell count, unspecified: Secondary | ICD-10-CM | POA: Diagnosis present

## 2016-09-09 DIAGNOSIS — I208 Other forms of angina pectoris: Secondary | ICD-10-CM | POA: Diagnosis not present

## 2016-09-09 DIAGNOSIS — I480 Paroxysmal atrial fibrillation: Secondary | ICD-10-CM | POA: Diagnosis not present

## 2016-09-09 DIAGNOSIS — I2510 Atherosclerotic heart disease of native coronary artery without angina pectoris: Secondary | ICD-10-CM

## 2016-09-09 DIAGNOSIS — I483 Typical atrial flutter: Secondary | ICD-10-CM | POA: Diagnosis not present

## 2016-09-09 LAB — TROPONIN I
TROPONIN I: 0.03 ng/mL — AB (ref <0.03)
Troponin I: <0.03 ng/mL (ref <0.03)

## 2016-09-09 LAB — MRSA PCR SCREENING: MRSA BY PCR: NEGATIVE

## 2016-09-09 MED ORDER — DILTIAZEM HCL ER COATED BEADS 120 MG PO CP24
120.0000 mg | ORAL_CAPSULE | Freq: Every day | ORAL | Status: DC
  Administered 2016-09-09 – 2016-09-10 (×2): 120 mg via ORAL
  Filled 2016-09-09 (×2): qty 1

## 2016-09-09 MED ORDER — ONDANSETRON HCL 4 MG/2ML IJ SOLN: 4.0000 mg | Freq: Four times a day (QID) | INTRAMUSCULAR | Status: DC | PRN

## 2016-09-09 MED ORDER — NITROGLYCERIN 0.4 MG SL SUBL: 0.4000 mg | SUBLINGUAL_TABLET | SUBLINGUAL | Status: DC | PRN

## 2016-09-09 MED ORDER — CHLORTHALIDONE 25 MG PO TABS
37.5000 mg | ORAL_TABLET | Freq: Every day | ORAL | Status: DC
  Administered 2016-09-09 – 2016-09-12 (×4): 37.5 mg via ORAL
  Filled 2016-09-09 (×4): qty 1.5

## 2016-09-09 MED ORDER — IRBESARTAN 150 MG PO TABS
300.0000 mg | ORAL_TABLET | Freq: Every day | ORAL | Status: DC
  Administered 2016-09-09 – 2016-09-11 (×3): 300 mg via ORAL
  Filled 2016-09-09 (×4): qty 2

## 2016-09-09 MED ORDER — PANTOPRAZOLE SODIUM 40 MG PO TBEC
40.0000 mg | DELAYED_RELEASE_TABLET | Freq: Every day | ORAL | Status: DC
  Administered 2016-09-10 – 2016-09-12 (×3): 40 mg via ORAL
  Filled 2016-09-09 (×4): qty 1

## 2016-09-09 MED ORDER — ALPRAZOLAM 0.25 MG PO TABS
0.2500 mg | ORAL_TABLET | Freq: Two times a day (BID) | ORAL | Status: DC | PRN
  Administered 2016-09-09 – 2016-09-11 (×4): 0.25 mg via ORAL
  Filled 2016-09-09 (×4): qty 1

## 2016-09-09 MED ORDER — ACETAMINOPHEN 325 MG PO TABS: 650.0000 mg | ORAL_TABLET | ORAL | Status: DC | PRN

## 2016-09-09 MED ORDER — SOTALOL HCL 80 MG PO TABS
80.0000 mg | ORAL_TABLET | Freq: Two times a day (BID) | ORAL | Status: DC
  Administered 2016-09-09: 80 mg via ORAL
  Filled 2016-09-09 (×3): qty 1

## 2016-09-09 MED ORDER — AMIODARONE HCL 200 MG PO TABS
400.0000 mg | ORAL_TABLET | Freq: Two times a day (BID) | ORAL | Status: DC
  Administered 2016-09-09 – 2016-09-11 (×4): 400 mg via ORAL
  Filled 2016-09-09 (×4): qty 2

## 2016-09-09 MED ORDER — ATORVASTATIN CALCIUM 20 MG PO TABS
20.0000 mg | ORAL_TABLET | Freq: Every day | ORAL | Status: DC
  Administered 2016-09-09 – 2016-09-11 (×3): 20 mg via ORAL
  Filled 2016-09-09 (×4): qty 1

## 2016-09-09 MED ORDER — OMEGA-3-ACID ETHYL ESTERS 1 G PO CAPS
1.0000 g | ORAL_CAPSULE | Freq: Two times a day (BID) | ORAL | Status: DC
  Administered 2016-09-09 – 2016-09-12 (×7): 1 g via ORAL
  Filled 2016-09-09 (×7): qty 1

## 2016-09-09 MED ORDER — SERTRALINE HCL 50 MG PO TABS
25.0000 mg | ORAL_TABLET | Freq: Every day | ORAL | Status: DC
  Administered 2016-09-09 – 2016-09-12 (×4): 25 mg via ORAL
  Filled 2016-09-09 (×4): qty 1

## 2016-09-09 MED ORDER — APIXABAN 5 MG PO TABS
5.0000 mg | ORAL_TABLET | Freq: Two times a day (BID) | ORAL | Status: DC
  Administered 2016-09-09 – 2016-09-12 (×7): 5 mg via ORAL
  Filled 2016-09-09 (×7): qty 1

## 2016-09-09 MED ORDER — ADULT MULTIVITAMIN W/MINERALS CH
1.0000 | ORAL_TABLET | Freq: Every day | ORAL | Status: DC
  Administered 2016-09-09 – 2016-09-12 (×4): 1 via ORAL
  Filled 2016-09-09 (×4): qty 1

## 2016-09-09 MED ORDER — OXYCODONE-ACETAMINOPHEN 5-325 MG PO TABS: 2.0000 | ORAL_TABLET | Freq: Once | ORAL | Status: DC

## 2016-09-09 NOTE — Consult Note (Signed)
ELECTROPHYSIOLOGY CONSULT NOTE    Patient ID: [name] [name] MRN: [id number], DOB/AGE: Feb 02, 1951 65 y.o.  Admit date: 09/08/2016 Date of Consult: 09/09/2016   Primary Physician: No PCP Per Patient Primary Cardiologist: [name]  Reason for Consultation: PAFib  HPI: [name] [name] is a 65 y.o. male  CAD status post 2 vessel CABG surgery in 2004 at [facility] with low risk nuclear stress test in 2016, paroxysmal atrial fibrillation, obstructive sleep apnea fully compliant with CPAP, HTN, HLD  Prior to today, his last hospitalization recently, discharged 08/22/16, also with c/o CP, He was found to be back in atrial fib with RVR and converted to NSR in the ED. His EKG was without ischemic changes. He was admitted for initiation of antiarrhythmic therapy with sotalol at 80mg  BID. His Toprol was stopped to allow for this. His diltiazem dose was decreased for sinus bradycardia. TSH was normal, and was on Eliquis for a/c and r/o for ACS. His celexa was switched to sertraline due to QTc prolongation with the former, and discharged then maintaining NSR with QTc .    He f/u with Rudi Coco, NP 09/01/16 with reports of episodes of afib since d/c. The shortest was 5 mins and the longest was almost 3 hours. Usually will return to SR  with a as needed metoprolol. He does have CPAP that he uses religiously. Occasional beer. Exercises 3x a week on a regular basis at the gym but struggles to lose weight.  Given short duration of episodes and QTc of no up-titration of his sotalol was done and instructed to continue PRN metoprolol.  Discussed ablation as possible next step, though he was not interested at that visit.  He comes in now again with c/o CP.  He spontaneously CV to SR in the ED but reports of brief bursts of AF observed.  The patient reports that he knows the second he goes into AFib, and the episodes in the last couple months are more frequent and getting longer lasting.  Associated with  racing heart beats, chest pressure and weakness.  He states the chest pressure is exactly the same/symptoms are exactly the same as back in June 2106 when he was 1st diagnosed with AFib RVR (had a negative stress test.    He reports this event more scary since he has been having so much of it lately and his chest was so heavy he thought he was "going to die"  When he is not in AFib he feels very good.  Though obese, he states he loves going to the gym and will ride 6 miles at a good pace (usually takes him 1/2 hour) without any kind of symptoms or exertional incapacities.  He has not had syncope.  LABS: poc Trop 0.01 Trop I: 0.03, <0.03 K+ 4.3 BUN/Creat 15/1.13 H/H 15/43 WBC 13.4 Plts 337    Past Medical History:  Diagnosis Date  . Anxiety   . Chronic chest pain   . Chronic sinus bradycardia   . Coronary artery disease    2v CABG, 2004 [facility])  . DJD (degenerative joint disease)   . Edema   . HLD (hyperlipidemia)   . Hypertension   . MI, old 2004  . OA (osteoarthritis)   . Obesity   . PAF (paroxysmal atrial fibrillation) (HCC)    a. started on sotalol 08/2016.  Marland Kitchen Palpitations    Reported history of PVCs  . QT prolongation   . Sleep apnea    on CPAP  .  Spinal stenosis      Surgical History:  Past Surgical History:  Procedure Laterality Date  . BACK SURGERY  2011  . CARPAL TUNNEL RELEASE     bilateral  . CORONARY ARTERY BYPASS GRAFT     2004  . fistula surgery    . hemorrhoidectomy    . LUMBAR LAMINECTOMY    . right foot fracture    . TRANSTHORACIC ECHOCARDIOGRAM  08/25/2010   EF 55-60%     Prescriptions Prior to Admission  Medication Sig Dispense Refill Last Dose  . acetaminophen (TYLENOL) 650 MG CR tablet Take 650 mg by mouth every 8 (eight) hours as needed for pain.    Past Week at Unknown time  . allopurinol (ZYLOPRIM) 100 MG tablet Take 100 mg by mouth daily as needed (for gout).   unknown  . apixaban (ELIQUIS) 5 MG TABS tablet Take 1 tablet (5 mg total) by  mouth 2 (two) times daily. 56 tablet 0 09/08/2016 at 1800  . atorvastatin (LIPITOR) 20 MG tablet Take 1 tablet (20 mg total) by mouth daily. 90 tablet 3 09/08/2016 at Unknown time  . chlorthalidone (HYGROTON) 25 MG tablet Take 1.5 tablets (37.5 mg total) by mouth daily. 45 tablet 3 09/08/2016 at Unknown time  . Coenzyme Q10 (CO Q 10) 100 MG CAPS Take 400 mg by mouth daily.   09/08/2016 at Unknown time  . diltiazem (CARDIZEM CD) 120 MG 24 hr capsule Take 1 capsule (120 mg total) by mouth daily. 30 capsule 6 09/08/2016 at Unknown time  . fish oil-omega-3 fatty acids 1000 MG capsule Take 3 g by mouth 2 (two) times daily.    09/08/2016 at Unknown time  . irbesartan (AVAPRO) 300 MG tablet Take 1 tablet (300 mg total) by mouth at bedtime. 90 tablet 3 09/08/2016 at Unknown time  . Multiple Vitamin (MULTIVITAMIN) tablet Take 1 tablet by mouth daily.   09/08/2016 at Unknown time  . omeprazole (PRILOSEC) 20 MG capsule Take 20 mg by mouth every other day.    09/08/2016 at Unknown time  . sertraline (ZOLOFT) 25 MG tablet Take 1 tablet (25 mg total) by mouth daily. 30 tablet 0 09/08/2016 at Unknown time  . sotalol (BETAPACE) 80 MG tablet Take 1 tablet (80 mg total) by mouth every 12 (twelve) hours. 60 tablet 3 09/08/2016 at Unknown time  . metoprolol tartrate (LOPRESSOR) 25 MG tablet Take 1 tablet (25 mg total) by mouth every 8 (eight) hours as needed. (Patient not taking: Reported on 09/08/2016) 180 tablet 3 Not Taking at Unknown time    Inpatient Medications:  . apixaban  5 mg Oral BID  . atorvastatin  20 mg Oral Daily  . chlorthalidone  37.5 mg Oral Daily  . diltiazem  120 mg Oral Daily  . irbesartan  300 mg Oral QHS  . multivitamin with minerals  1 tablet Oral Daily  . omega-3 acid ethyl esters  1 g Oral BID  . oxyCODONE-acetaminophen  2 tablet Oral Once  . pantoprazole  40 mg Oral Daily  . sertraline  25 mg Oral Daily  . sotalol  80 mg Oral BID    Allergies:  Allergies  Allergen Reactions  . Nsaids Other  (See Comments)    On blood thinner  . Vancomycin Other (See Comments)    Was informed after heart surgery ? Breathing problem/patient unsure    Social History   Social History  . Marital status: Married    Spouse name: [name]  . Number of children:  0  . 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 Smoking status: Never Smoker  . Smokeless tobacco: Never Used  . Alcohol use No  . Drug use: No  . Sexual activity: Yes   Other Topics Concern  . Not on file   Social History Narrative  . No narrative on file     Family History  Problem Relation Age of Onset  . Breast cancer Mother   . Hypertension Mother   . Coronary artery disease Father   . Stroke Father   . Hypertension Father      Review of Systems: All other systems reviewed and are otherwise negative except as noted above.  Physical Exam: Vitals:   09/09/16 0750 09/09/16 0800 09/09/16 1229 09/09/16 1230  BP: 131/73 111/70 (!) 110/56 (!) 110/56  Pulse: (!) 58 64 (!) 54 (!) 53  Resp: (!) 21 19 (!) 24 (!) 23  Temp:   97.8 F (36.6 C)   TempSrc:   Oral   SpO2: 97% 99% 99% 98%  Weight:      Height:        GEN- The patient is well appearing, though somewhat anxious, alert and oriented x 3 today.   HEENT: normocephalic, atraumatic; sclera clear, conjunctiva pink; hearing intact; oropharynx clear; neck supple, no JVP Lymph- no cervical lymphadenopathy Lungs- Clear to ausculation bilaterally, normal work of breathing.  No wheezes, rales, rhonchi Heart- Regular rate and rhythm, no murmurs, rubs or gallops, PMI not laterally displaced GI- soft, non-tender, non-distended, bowel sounds present Extremities- no clubbing, cyanosis, trace edema MS- no significant deformity or atrophy Skin- warm and dry, no rash or lesion Psych- euthymic mood, full affect Neuro- no gross deficits observed  Labs:   Lab Results  Component Value Date   WBC 13.4  (H) 09/08/2016   HGB 15.2 09/08/2016   HCT 43.2 09/08/2016   MCV 84.2 09/08/2016   PLT 337 09/08/2016    Recent Labs Lab 09/08/16 2118  NA 138  K 4.3  CL 106  CO2 23  BUN 15  CREATININE 1.13  CALCIUM 9.8  PROT 7.9  BILITOT 0.5  ALKPHOS 74  ALT 27  AST 27  GLUCOSE 133*      Radiology/Studies:  Dg Chest 2 View Result Date: 09/08/2016 CLINICAL DATA:  Chest pain and dyspnea today. History CABG in atrial fibrillation. EXAM: CHEST  2 VIEW COMPARISON:  08/19/2016 FINDINGS: The heart is enlarged. The patient is status post median sternotomy. Mild interstitial prominence without overt pulmonary edema, pneumonic consolidation, effusion or pneumothorax. Minimal atelectasis at the lung bases left greater right. No acute osseous abnormality. IMPRESSION: Bibasilar subsegmental atelectasis with stable cardiomegaly. Aortic atherosclerosis.  No significant change from prior. Electronically Signed   By: Tollie Ethavid  Kwon M.D.   On: 09/08/2016 22:22     EKG:SB, 57bpm, PR 160ms, QRS 92bpm, QTc 449ms TELEMETRY: PAFib for approx 2 hours early this AM, SR since generally 50's  Prior cardiac testing: Dr. Wyline MoodBranch noted: apparently had follow up cath in 2006 from [place] with patent grafts. Reports negative stress test in 2011 by Dr Hyacinth MeekerMiller in [place]. - 03/28/2015 Lexiscan MPI no ischemia  03/28/15: TTE Study Conclusions - Left ventricle: The cavity size was normal. Wall thickness was   increased in a pattern of mild LVH. Systolic function was normal.   The estimated ejection fraction was in the range of 50% to 55%.   Wall motion  was normal; there were no regional wall motion   abnormalities. Doppler parameters are consistent with high   ventricular filling pressure. - Left atrium: The atrium was mildly dilated. 43mm   Assessment and Plan:   1. CP     Neg Trop x2     Likely secondary to AFib  2. PAFib     CHA2DS2Vasc is at least 2, on Eliquis     Currently on Sotalol 80mg  BID, hx of reported  QTc of at AF clinic     SB 50's      The patient reports that his episodes are becoming more frequent and longer lasting, and more symptomatic.  He would like to discuss with Dr. Johney Frame ablation procedure, understanding this is another treatment,not a cure, but would like to get an idea how successful he thinks his odds would be.    3. CAD     CP only with his Afib, otherwise asymptomatic     No ASA with NSAID allregy      Negative stress test June 2016  4. HTN     stable    Signed, Francis Dowse, PA-C 09/09/2016 3:13 PM  I have seen, examined the patient, and reviewed the above assessment and plan.  On exam, overweight, RRR.  Changes to above are made where necessary.  He has symptomatic paroxysmal atrial fibrillation.Therapeutic strategies for afib including medicine and ablation were discussed in detail with the patient today. Risk, benefits, and alternatives to EP study and radiofrequency ablation for afib were also discussed in detail today. These risks include but are not limited to stroke, bleeding, vascular damage, tamponade, perforation, damage to the esophagus, lungs, and other structures, pulmonary vein stenosis, worsening renal function, and death. The patient understands these risk and wishes to proceed.  I will arrange elective afib ablation as our schedule allows (Likely 4-6 weeks).  Will obtain echo to evaluate for structural heart abnormality.  Stop sotalol.  Start amiodarone 400mg  BID x 7 days then 200mg  BID x 14 days then 200mg  daily.  Risks of amiodarone discussed with the patient and he wishes to take this until after his ablation.    Ok to discharge to home in am after echo. Follow-up in AF clinic in 1-2 weeks.  Co Sign: Hillis Range, MD 09/09/2016 5:39 PM

## 2016-09-09 NOTE — H&P (Signed)
History and Physical    CAINE BARFIELD ZOX:[id number] DOB: [birth year] DOA: 09/08/2016  PCP: No PCP Per Patient   Patient coming from: Home  Chief Complaint: Chest pain, palpitations   HPI: [name] [name] is a 65 y.o. male with medical history significant for hypertension, hyperlipidemia, coronary artery disease with CABG in 2004, and paroxysmal atrial fibrillation who presents to the emergency department with complaints of severe chest pain with palpitations since approximately 2:30 PM on the day of his presentation. Patient reports occasional episodes of palpitations, usually resolving spontaneously within 1-2 hours. He takes metoprolol as needed for palpitations. He had an episode yesterday that was not associated with much chest pain and resolved in approximately one hour. He woke in his usual state and was doing well today until approximately 2:30 PM when he developed acute onset of severe left-sided chest pain with radiation to the left shoulder, left neck, and left arm. Pain was described as severe and pressure-like. The pain eased off spontaneously at several points throughout the day, but recurs. He took 2 doses of 25 mg metoprolol this evening with some mild improvement in his symptoms. He reports an allergy to aspirin. He denies any recent fevers or chills, cough or dyspnea, lower extremity edema or orthopnea.   ED Course: Upon arrival to the ED, patient is found to be afebrile, saturating well on room air, bradycardic in the mid to high 50s, and with vitals otherwise stable. EKG features a sinus bradycardia with rate 57, PVCs, and incomplete right bundle branch block. Chest x-ray is notable for bibasilar subsegmental atelectasis and stable cardiomegaly. Chemistry panel is unremarkable and CBC is notable for a leukocytosis to 13,400. INR is within the normal limits and initial troponin is reassuring at 0.01. Urinalysis is unremarkable. Cardiology was consulted by the ED physician and advises a  medical admission to rule out acute coronary syndrome and asked that cardiology be reconsulted in the morning as needed. Patient experienced resolution of his chest pain in the ED and has been in a sinus rhythm, with brief periods of apparent atrial fibrillation on the monitor per ED provider. He will be observed on the telemetry unit for ongoing evaluation and management of chest pain, likely secondary to paroxysmal atrial fibrillation, but with known CAD and concern for possible ACS.  Review of Systems:  All other systems reviewed and apart from HPI, are negative.  Past Medical History:  Diagnosis Date  . Anxiety   . Chronic chest pain   . Chronic sinus bradycardia   . Coronary artery disease    2v CABG, 2004 [facility])  . DJD (degenerative joint disease)   . Edema   . HLD (hyperlipidemia)   . Hypertension   . MI, old 2004  . OA (osteoarthritis)   . Obesity   . PAF (paroxysmal atrial fibrillation) (HCC)    a. started on sotalol 08/2016.  Marland Kitchen Palpitations    Reported history of PVCs  . QT prolongation   . Sleep apnea    on CPAP  . Spinal stenosis     Past Surgical History:  Procedure Laterality Date  . BACK SURGERY  2011  . CARPAL TUNNEL RELEASE     bilateral  . CORONARY ARTERY BYPASS GRAFT     2004  . fistula surgery    . hemorrhoidectomy    . LUMBAR LAMINECTOMY    . right foot fracture    . TRANSTHORACIC ECHOCARDIOGRAM  08/25/2010   EF 55-60%     reports that  he has never smoked. He has never used smokeless tobacco. He reports that he does not drink alcohol or use drugs.  Allergies  Allergen Reactions  . Nsaids Other (See Comments)    On blood thinner  . Vancomycin Other (See Comments)    Was informed after heart surgery ? Breathing problem/patient unsure    Family History  Problem Relation Age of Onset  . Breast cancer Mother   . Hypertension Mother   . Coronary artery disease Father   . Stroke Father   . Hypertension Father      Prior to Admission  medications   Medication Sig Start Date End Date Taking? Authorizing Provider  acetaminophen (TYLENOL) 650 MG CR tablet Take 650 mg by mouth every 8 (eight) hours as needed for pain.    Yes Historical Provider, MD  allopurinol (ZYLOPRIM) 100 MG tablet Take 100 mg by mouth daily as needed (for gout).   Yes Historical Provider, MD  apixaban (ELIQUIS) 5 MG TABS tablet Take 1 tablet (5 mg total) by mouth 2 (two) times daily. 08/26/16  Yes Antoine Poche, MD  atorvastatin (LIPITOR) 20 MG tablet Take 1 tablet (20 mg total) by mouth daily. 02/06/16  Yes Antoine Poche, MD  chlorthalidone (HYGROTON) 25 MG tablet Take 1.5 tablets (37.5 mg total) by mouth daily. 08/26/16 11/24/16 Yes Antoine Poche, MD  Coenzyme Q10 (CO Q 10) 100 MG CAPS Take 400 mg by mouth daily.   Yes Historical Provider, MD  diltiazem (CARDIZEM CD) 120 MG 24 hr capsule Take 1 capsule (120 mg total) by mouth daily. 08/22/16  Yes Dayna N Dunn, PA-C  fish oil-omega-3 fatty acids 1000 MG capsule Take 3 g by mouth 2 (two) times daily.    Yes Historical Provider, MD  irbesartan (AVAPRO) 300 MG tablet Take 1 tablet (300 mg total) by mouth at bedtime. 07/23/16 07/23/17 Yes Antoine Poche, MD  Multiple Vitamin (MULTIVITAMIN) tablet Take 1 tablet by mouth daily.   Yes Historical Provider, MD  omeprazole (PRILOSEC) 20 MG capsule Take 20 mg by mouth every other day.    Yes Historical Provider, MD  sertraline (ZOLOFT) 25 MG tablet Take 1 tablet (25 mg total) by mouth daily. 08/22/16  Yes Dayna N Dunn, PA-C  sotalol (BETAPACE) 80 MG tablet Take 1 tablet (80 mg total) by mouth every 12 (twelve) hours. 08/26/16  Yes Antoine Poche, MD  metoprolol tartrate (LOPRESSOR) 25 MG tablet Take 1 tablet (25 mg total) by mouth every 8 (eight) hours as needed. Patient not taking: Reported on 09/08/2016 08/26/16   Antoine Poche, MD    Physical Exam: Vitals:   09/08/16 2230 09/08/16 2300 09/08/16 2315 09/08/16 2345  BP: 117/63 119/71 113/69 125/80    Pulse: 61 70 76 79  Resp: 16 20 18 17   Temp:      TempSrc:      SpO2: 96% 96% 95% 96%  Weight:      Height:          Constitutional: NAD, calm, comfortable, obese Eyes: PERTLA, lids and conjunctivae normal ENMT: Mucous membranes are moist. Posterior pharynx clear of any exudate or lesions.   Neck: normal, supple, no masses, no thyromegaly Respiratory: clear to auscultation bilaterally, no wheezing, no crackles. Normal respiratory effort.    Cardiovascular: S1 & S2 heard, regular rate and rhythm. Trace BLE edema. No significant JVD. Abdomen: No distension, no tenderness, no masses palpated. Bowel sounds normal.  Musculoskeletal: no clubbing / cyanosis. No joint deformity  upper and lower extremities. Normal muscle tone.  Skin: no significant rashes, lesions, ulcers. Warm, dry, well-perfused. Neurologic: CN 2-12 grossly intact. Sensation intact, DTR normal. Strength 5/5 in all 4 limbs.  Psychiatric: Normal judgment and insight. Alert and oriented x 3. Normal mood and affect.     Labs on Admission: I have personally reviewed following labs and imaging studies  CBC:  Recent Labs Lab 09/08/16 2124  WBC 13.4*  HGB 15.2  HCT 43.2  MCV 84.2  PLT 337   Basic Metabolic Panel:  Recent Labs Lab 09/08/16 2118  NA 138  K 4.3  CL 106  CO2 23  GLUCOSE 133*  BUN 15  CREATININE 1.13  CALCIUM 9.8   GFR: Estimated Creatinine Clearance: 79.1 mL/min (by C-G formula based on SCr of 1.13 mg/dL). Liver Function Tests:  Recent Labs Lab 09/08/16 2118  AST 27  ALT 27  ALKPHOS 74  BILITOT 0.5  PROT 7.9  ALBUMIN 3.4*   No results for input(s): LIPASE, AMYLASE in the last 168 hours. No results for input(s): AMMONIA in the last 168 hours. Coagulation Profile:  Recent Labs Lab 09/08/16 2118  INR 1.13   Cardiac Enzymes: No results for input(s): CKTOTAL, CKMB, CKMBINDEX, TROPONINI in the last 168 hours. BNP (last 3 results) No results for input(s): PROBNP in the last 8760  hours. HbA1C: No results for input(s): HGBA1C in the last 72 hours. CBG: No results for input(s): GLUCAP in the last 168 hours. Lipid Profile: No results for input(s): CHOL, HDL, LDLCALC, TRIG, CHOLHDL, LDLDIRECT in the last 72 hours. Thyroid Function Tests: No results for input(s): TSH, T4TOTAL, FREET4, T3FREE, THYROIDAB in the last 72 hours. Anemia Panel: No results for input(s): VITAMINB12, FOLATE, FERRITIN, TIBC, IRON, RETICCTPCT in the last 72 hours. Urine analysis:    Component Value Date/Time   COLORURINE YELLOW 09/08/2016 2115   APPEARANCEUR CLEAR 09/08/2016 2115   LABSPEC 1.013 09/08/2016 2115   PHURINE 6.0 09/08/2016 2115   GLUCOSEU NEGATIVE 09/08/2016 2115   HGBUR NEGATIVE 09/08/2016 2115   BILIRUBINUR NEGATIVE 09/08/2016 2115   KETONESUR NEGATIVE 09/08/2016 2115   PROTEINUR NEGATIVE 09/08/2016 2115   NITRITE NEGATIVE 09/08/2016 2115   LEUKOCYTESUR NEGATIVE 09/08/2016 2115   Sepsis Labs: @LABRCNTIP (procalcitonin:4,lacticidven:4) )No results found for this or any previous visit (from the past 240 hour(s)).   Radiological Exams on Admission: Dg Chest 2 View  Result Date: 09/08/2016 CLINICAL DATA:  Chest pain and dyspnea today. History CABG in atrial fibrillation. EXAM: CHEST  2 VIEW COMPARISON:  08/19/2016 FINDINGS: The heart is enlarged. The patient is status post median sternotomy. Mild interstitial prominence without overt pulmonary edema, pneumonic consolidation, effusion or pneumothorax. Minimal atelectasis at the lung bases left greater right. No acute osseous abnormality. IMPRESSION: Bibasilar subsegmental atelectasis with stable cardiomegaly. Aortic atherosclerosis.  No significant change from prior. Electronically Signed   By: Tollie Eth M.D.   On: 09/08/2016 22:22    EKG: Independently reviewed. Sinus bradycardia (rate 57), PVCs, incomplete RBBB  Assessment/Plan  1. Chest pain, CAD  - Pt has hx of CAD s/p CABG in 2004 at [facility], followed by cardiologist, Dr.  Wyline Mood in [place]  - Normal, low-risk nuclear medicine study in June 2016 - Managed at home with Lipitor  - Initial troponin is 0.01 and no acute ischemic features evident on initial EKG; CXR negative for acute cardiopulmonary disease  - Suspect the pain is secondary to paroxysmal atrial fibrillation given the associated palpitations and improvement with metoprolol   - Plan to  observe on telemetry with serial troponin measurements and repeat EKG   2. Paroxysmal atrial fibrillation  - In a sinus rhythm on presentation  - CHADS-VASc at least 3 (age, HTN, CAD) - Continue AC with Eliquis; continue sotalol and diltiazem at current dosing  - Monitor on telemetry    3. Hypertension  - At goal currently  - Continue chlorthalidone and diltiazem as tolerated    4. GERD  - No EGD report on file - Continue daily PPI    5. Chronic sinus bradycardia  - HR is mid 50s in ED; pt takes metoprolol prn palpitations and took 25 mg tabs the evening of admission  - Monitor on telemetry, ruling-out ACS as above    6. Leukocytosis - WBC is 13,400 on admission without fever and with unremarkable CXR and UA - No apparent focus of infection  - Plan to culture if febrile, watch off of abx    DVT prophylaxis: Eliquis Code Status: Full  Family Communication: Wife updated at bedside Disposition Plan: Observe on telemetry Consults called: None Admission status: Observation    Briscoe Deutscherimothy S Opyd, MD [facility] Pager [phone]  If 7PM-7AM, please contact night-coverage [email or web address] Password [email or web address]  09/09/2016, 12:35 AM

## 2016-09-09 NOTE — Care Management Obs Status (Signed)
MEDICARE OBSERVATION STATUS NOTIFICATION   Patient Details  Name: [name] [name] MRN: [id number] Date of Birth: December 22, 1950   Medicare Observation Status Notification Given:  Yes    Hanley Hays, RN 09/09/2016, 9:59 AM

## 2016-09-09 NOTE — Progress Notes (Signed)
PROGRESS NOTE    [name] SimmondsDanny R [name]  ZOX:[id number] DOB: 1951/04/22 DOA: 09/08/2016 PCP: No PCP Per Patient   Brief Narrative: [name] [name] is a 65 y.o. male with medical history significant for hypertension, hyperlipidemia, coronary artery disease with CABG in 2004, and paroxysmal atrial fibrillation who presents to the emergency department with complaints of severe chest pain with palpitations since approximately 2:30 PM on the day of his presentation. Patient reports occasional episodes of palpitations, usually resolving spontaneously within 1-2 hours.   Patient had similar presentation last month, cardiologist started him on sotalol.    Assessment & Plan:   Principal Problem:   Chest pain Active Problems:   Essential hypertension   Coronary atherosclerosis   GERD   Chronic sinus bradycardia   Paroxysmal atrial fibrillation (HCC)   Leukocytosis   1-Chest pain; in setting of ? A fib. Troponin negative. Second admission for the same.  Started on sotalol last admission.  Cardiology, EP consulted.  Normal, low-risk nuclear medicine study in June 2016  2-A fib;continue with Cardizem, sotalol. Sinus rhythm this am.  EP consulted.  Continue with eliquis.   3-HTN; On Cardizem, sotalol.   4-Chronic sinus bradycardia  - HR is mid 50s in ED; pt takes metoprolol prn palpitations and took 25 mg tabs the evening of admission  - Monitor on telemetry, ruling-out ACS as above    5-Leukocytosis; no focus of infection. Afebrile. Follow trend.    DVT prophylaxis: Eliquis Code Status: Full code.  Family Communication: care discussed with patient and wife    Disposition Plan: home at time of discharge. Awaiting EP evaluation.   Consultants:   EP   Procedures: none   Antimicrobials: none   Subjective: He is feeling better.  Denies chest pain. Yesterday he felt palpitation and then chest pain   Objective: Vitals:   09/09/16 0500 09/09/16 0742 09/09/16 0750 09/09/16 0800    BP:  131/73 131/73 111/70  Pulse:  60 (!) 58 64  Resp:  20 (!) 21 19  Temp:  97.2 F (36.2 C)    TempSrc:  Oral    SpO2:  99% 97% 99%  Weight: 117.9 kg (260 lb)     Height:       No intake or output data in the 24 hours ending 09/09/16 0833 Filed Weights   09/08/16 2106 09/09/16 0500  Weight: 113.4 kg (250 lb) 117.9 kg (260 lb)    Examination:  General exam: Appears calm and comfortable  Respiratory system: Clear to auscultation. Respiratory effort normal. Cardiovascular system: S1 & S2 heard, RRR. No JVD, murmurs, rubs, gallops or clicks. No pedal edema. Gastrointestinal system: Abdomen is nondistended, soft and nontender. No organomegaly or masses felt. Normal bowel sounds heard. Central nervous system: Alert and oriented. No focal neurological deficits. Extremities: Symmetric 5 x 5 power. Skin: No rashes, lesions or ulcers Psychiatry: Judgement and insight appear normal. Mood & affect appropriate.     Data Reviewed: I have personally reviewed following labs and imaging studies  CBC:  Recent Labs Lab 09/08/16 2124  WBC 13.4*  HGB 15.2  HCT 43.2  MCV 84.2  PLT 337   Basic Metabolic Panel:  Recent Labs Lab 09/08/16 2118  NA 138  K 4.3  CL 106  CO2 23  GLUCOSE 133*  BUN 15  CREATININE 1.13  CALCIUM 9.8   GFR: Estimated Creatinine Clearance: 80.8 mL/min (by C-G formula based on SCr of 1.13 mg/dL). Liver Function Tests:  Recent Labs Lab 09/08/16 2118  AST 27  ALT 27  ALKPHOS 74  BILITOT 0.5  PROT 7.9  ALBUMIN 3.4*   No results for input(s): LIPASE, AMYLASE in the last 168 hours. No results for input(s): AMMONIA in the last 168 hours. Coagulation Profile:  Recent Labs Lab 09/08/16 2118  INR 1.13   Cardiac Enzymes:  Recent Labs Lab 09/09/16 0245  TROPONINI 0.03*   BNP (last 3 results) No results for input(s): PROBNP in the last 8760 hours. HbA1C: No results for input(s): HGBA1C in the last 72 hours. CBG: No results for input(s):  GLUCAP in the last 168 hours. Lipid Profile: No results for input(s): CHOL, HDL, LDLCALC, TRIG, CHOLHDL, LDLDIRECT in the last 72 hours. Thyroid Function Tests: No results for input(s): TSH, T4TOTAL, FREET4, T3FREE, THYROIDAB in the last 72 hours. Anemia Panel: No results for input(s): VITAMINB12, FOLATE, FERRITIN, TIBC, IRON, RETICCTPCT in the last 72 hours. Sepsis Labs: No results for input(s): PROCALCITON, LATICACIDVEN in the last 168 hours.  Recent Results (from the past 240 hour(s))  MRSA PCR Screening     Status: None   Collection Time: 09/09/16  4:53 AM  Result Value Ref Range Status   MRSA by PCR NEGATIVE NEGATIVE Final    Comment:        The GeneXpert MRSA Assay (FDA approved for NASAL specimens only), is one component of a comprehensive MRSA colonization surveillance program. It is not intended to diagnose MRSA infection nor to guide or monitor treatment for MRSA infections.          Radiology Studies: Dg Chest 2 View  Result Date: 09/08/2016 CLINICAL DATA:  Chest pain and dyspnea today. History CABG in atrial fibrillation. EXAM: CHEST  2 VIEW COMPARISON:  08/19/2016 FINDINGS: The heart is enlarged. The patient is status post median sternotomy. Mild interstitial prominence without overt pulmonary edema, pneumonic consolidation, effusion or pneumothorax. Minimal atelectasis at the lung bases left greater right. No acute osseous abnormality. IMPRESSION: Bibasilar subsegmental atelectasis with stable cardiomegaly. Aortic atherosclerosis.  No significant change from prior. Electronically Signed   By: Tollie Eth M.D.   On: 09/08/2016 22:22        Scheduled Meds: . apixaban  5 mg Oral BID  . atorvastatin  20 mg Oral Daily  . chlorthalidone  37.5 mg Oral Daily  . diltiazem  120 mg Oral Daily  . irbesartan  300 mg Oral QHS  . multivitamin with minerals  1 tablet Oral Daily  . omega-3 acid ethyl esters  1 g Oral BID  . oxyCODONE-acetaminophen  2 tablet Oral Once    . pantoprazole  40 mg Oral Daily  . sertraline  25 mg Oral Daily  . sotalol  80 mg Oral BID   Continuous Infusions:   LOS: 0 days    Time spent: 35 minutes.     Alba Cory, MD [facility] Pager [phone]  If 7PM-7AM, please contact night-coverage [email or web address] Password [facility] 09/09/2016, 8:33 AM

## 2016-09-09 NOTE — Telephone Encounter (Signed)
Pt made aware on VM

## 2016-09-09 NOTE — Telephone Encounter (Signed)
-----   Message from Antoine Poche, MD sent at 09/02/2016 12:28 PM EST ----- Labs look good  Dominga Ferry MD

## 2016-09-10 ENCOUNTER — Observation Stay (HOSPITAL_BASED_OUTPATIENT_CLINIC_OR_DEPARTMENT_OTHER): Payer: MEDICARE

## 2016-09-10 DIAGNOSIS — I4891 Unspecified atrial fibrillation: Secondary | ICD-10-CM | POA: Diagnosis not present

## 2016-09-10 DIAGNOSIS — I208 Other forms of angina pectoris: Secondary | ICD-10-CM | POA: Diagnosis not present

## 2016-09-10 DIAGNOSIS — Z6841 Body Mass Index (BMI) 40.0 and over, adult: Secondary | ICD-10-CM | POA: Diagnosis not present

## 2016-09-10 DIAGNOSIS — I4510 Unspecified right bundle-branch block: Secondary | ICD-10-CM | POA: Diagnosis not present

## 2016-09-10 DIAGNOSIS — F419 Anxiety disorder, unspecified: Secondary | ICD-10-CM | POA: Diagnosis not present

## 2016-09-10 DIAGNOSIS — K219 Gastro-esophageal reflux disease without esophagitis: Secondary | ICD-10-CM | POA: Diagnosis not present

## 2016-09-10 DIAGNOSIS — R079 Chest pain, unspecified: Secondary | ICD-10-CM | POA: Diagnosis not present

## 2016-09-10 DIAGNOSIS — I25119 Atherosclerotic heart disease of native coronary artery with unspecified angina pectoris: Secondary | ICD-10-CM | POA: Diagnosis not present

## 2016-09-10 DIAGNOSIS — M4800 Spinal stenosis, site unspecified: Secondary | ICD-10-CM | POA: Diagnosis not present

## 2016-09-10 DIAGNOSIS — I252 Old myocardial infarction: Secondary | ICD-10-CM | POA: Diagnosis not present

## 2016-09-10 DIAGNOSIS — I483 Typical atrial flutter: Secondary | ICD-10-CM | POA: Diagnosis not present

## 2016-09-10 DIAGNOSIS — M25512 Pain in left shoulder: Secondary | ICD-10-CM | POA: Diagnosis not present

## 2016-09-10 DIAGNOSIS — D72829 Elevated white blood cell count, unspecified: Secondary | ICD-10-CM | POA: Diagnosis not present

## 2016-09-10 DIAGNOSIS — I480 Paroxysmal atrial fibrillation: Secondary | ICD-10-CM | POA: Diagnosis not present

## 2016-09-10 DIAGNOSIS — I119 Hypertensive heart disease without heart failure: Secondary | ICD-10-CM | POA: Diagnosis not present

## 2016-09-10 DIAGNOSIS — M79602 Pain in left arm: Secondary | ICD-10-CM | POA: Diagnosis not present

## 2016-09-10 DIAGNOSIS — G4733 Obstructive sleep apnea (adult) (pediatric): Secondary | ICD-10-CM | POA: Diagnosis not present

## 2016-09-10 DIAGNOSIS — R001 Bradycardia, unspecified: Secondary | ICD-10-CM | POA: Diagnosis not present

## 2016-09-10 DIAGNOSIS — E785 Hyperlipidemia, unspecified: Secondary | ICD-10-CM | POA: Diagnosis not present

## 2016-09-10 DIAGNOSIS — M1990 Unspecified osteoarthritis, unspecified site: Secondary | ICD-10-CM | POA: Diagnosis not present

## 2016-09-10 DIAGNOSIS — Z951 Presence of aortocoronary bypass graft: Secondary | ICD-10-CM | POA: Diagnosis not present

## 2016-09-10 DIAGNOSIS — I10 Essential (primary) hypertension: Secondary | ICD-10-CM | POA: Diagnosis not present

## 2016-09-10 LAB — ECHOCARDIOGRAM COMPLETE
HEIGHTINCHES: 66 in
WEIGHTICAEL: 3859.2 [oz_av]

## 2016-09-10 MED ORDER — DILTIAZEM HCL ER COATED BEADS 120 MG PO CP24
120.0000 mg | ORAL_CAPSULE | Freq: Once | ORAL | Status: AC
  Administered 2016-09-10: 120 mg via ORAL
  Filled 2016-09-10: qty 1

## 2016-09-10 MED ORDER — DILTIAZEM HCL ER COATED BEADS 240 MG PO CP24
240.0000 mg | ORAL_CAPSULE | Freq: Every day | ORAL | Status: DC
  Administered 2016-09-11: 240 mg via ORAL
  Filled 2016-09-10: qty 1

## 2016-09-10 MED ORDER — AMIODARONE HCL 200 MG PO TABS
400.0000 mg | ORAL_TABLET | Freq: Once | ORAL | Status: AC
  Administered 2016-09-10: 400 mg via ORAL
  Filled 2016-09-10: qty 2

## 2016-09-10 MED ORDER — AMIODARONE HCL 400 MG PO TABS: ORAL_TABLET | ORAL | 0 refills | Status: DC

## 2016-09-10 MED ORDER — METOPROLOL TARTRATE 5 MG/5ML IV SOLN
2.5000 mg | Freq: Four times a day (QID) | INTRAVENOUS | Status: DC | PRN
  Administered 2016-09-10 – 2016-09-11 (×2): 2.5 mg via INTRAVENOUS
  Filled 2016-09-10 (×2): qty 5

## 2016-09-10 NOTE — Progress Notes (Signed)
Pt noted in Afib on the monitor, HR up to 140's while sitting in chair and eating lunch. Reports feeling "chest hurting" Both Dr. Sunnie Nielsen, Hospitalist and Dr. Johney Frame, Cardiologist called and made aware. Received new orders.

## 2016-09-10 NOTE — Progress Notes (Signed)
PROGRESS NOTE    [name] [name]  RXV:[id number] DOB: January 25, 1951 DOA: 09/08/2016 PCP: No PCP Per Patient   Brief Narrative: [name] [name] is a 65 y.o. male with medical history significant for hypertension, hyperlipidemia, coronary artery disease with CABG in 2004, and paroxysmal atrial fibrillation who presents to the emergency department with complaints of severe chest pain with palpitations since approximately 2:30 PM on the day of his presentation. Patient reports occasional episodes of palpitations, usually resolving spontaneously within 1-2 hours.   Patient had similar presentation last month, cardiologist started him on sotalol.    Assessment & Plan:   Principal Problem:   Chest pain Active Problems:   Essential hypertension   Coronary atherosclerosis   GERD   Chronic sinus bradycardia   Paroxysmal atrial fibrillation (HCC)   Leukocytosis   1-Chest pain; in setting of  A fib. Troponin negative. Second admission for the same.  Started on sotalol last admission. Discontinue by cardio. Started on amiodarone.  Cardiology, EP consulted.  Normal, low-risk nuclear medicine study in June 2016 Plan was for patient to be discharge 12-07, but he develops chest pain, went back to a fib RVR. IV lopressor ordered PRN. Cardiology will see patient also.   2-A fib;continue with Cardizem, sotalol. Sinus rhythm this am.  EP consulted.  Continue with eliquis.  Started on amiodarone.  IV lopressor PRN>   3-HTN; On Cardizem.   4-Chronic sinus bradycardia  - HR is mid 50s in ED; pt takes metoprolol prn palpitations and took 25 mg tabs the evening of admission  - Monitor on telemetry, ruling-out ACS as above    5-Leukocytosis; no focus of infection. Afebrile. Follow trend.    DVT prophylaxis: Eliquis Code Status: Full code.  Family Communication: care discussed with patient.    Disposition Plan: home at time of discharge. Awaiting EP evaluation.   Consultants:    EP   Procedures: none   Antimicrobials: none   Subjective: Was doing well this morning.  Develops A fib RVR again, chest pain.    Objective: Vitals:   09/10/16 0829 09/10/16 0830 09/10/16 1100 09/10/16 1358  BP: 139/68 139/68 128/64 (!) 155/114  Pulse: (!) 59 (!) 58 66 83  Resp: 15 17 (!) 21 (!) 25  Temp: 98.5 F (36.9 C)  98.4 F (36.9 C)   TempSrc: Oral  Oral   SpO2: 96% 97% 97% 100%  Weight:      Height:        Intake/Output Summary (Last 24 hours) at 09/10/16 1434 Last data filed at 09/10/16 0331  Gross per 24 hour  Intake                0 ml  Output              801 ml  Net             -801 ml   Filed Weights   09/08/16 2106 09/09/16 0500 09/10/16 0323  Weight: 113.4 kg (250 lb) 117.9 kg (260 lb) 109.4 kg (241 lb 3.2 oz)    Examination:  General exam: Appears calm and comfortable  Respiratory system: Clear to auscultation. Respiratory effort normal. Cardiovascular system: S1 & S2 heard, RRR. No JVD, murmurs, rubs, gallops or clicks. No pedal edema. Gastrointestinal system: Abdomen is nondistended, soft and nontender. No organomegaly or masses felt. Normal bowel sounds heard. Central nervous system: Alert and oriented. No focal neurological deficits. Extremities: Symmetric 5 x 5 power. Skin: No rashes, lesions or ulcers  Psychiatry: Judgement and insight appear normal. Mood & affect appropriate.     Data Reviewed: I have personally reviewed following labs and imaging studies  CBC:  Recent Labs Lab 09/08/16 2124  WBC 13.4*  HGB 15.2  HCT 43.2  MCV 84.2  PLT 337   Basic Metabolic Panel:  Recent Labs Lab 09/08/16 2118  NA 138  K 4.3  CL 106  CO2 23  GLUCOSE 133*  BUN 15  CREATININE 1.13  CALCIUM 9.8   GFR: Estimated Creatinine Clearance: 77.6 mL/min (by C-G formula based on SCr of 1.13 mg/dL). Liver Function Tests:  Recent Labs Lab 09/08/16 2118  AST 27  ALT 27  ALKPHOS 74  BILITOT 0.5  PROT 7.9  ALBUMIN 3.4*   No  results for input(s): LIPASE, AMYLASE in the last 168 hours. No results for input(s): AMMONIA in the last 168 hours. Coagulation Profile:  Recent Labs Lab 09/08/16 2118  INR 1.13   Cardiac Enzymes:  Recent Labs Lab 09/09/16 0245 09/09/16 0707 09/09/16 1410  TROPONINI 0.03* <0.03 <0.03   BNP (last 3 results) No results for input(s): PROBNP in the last 8760 hours. HbA1C: No results for input(s): HGBA1C in the last 72 hours. CBG: No results for input(s): GLUCAP in the last 168 hours. Lipid Profile: No results for input(s): CHOL, HDL, LDLCALC, TRIG, CHOLHDL, LDLDIRECT in the last 72 hours. Thyroid Function Tests: No results for input(s): TSH, T4TOTAL, FREET4, T3FREE, THYROIDAB in the last 72 hours. Anemia Panel: No results for input(s): VITAMINB12, FOLATE, FERRITIN, TIBC, IRON, RETICCTPCT in the last 72 hours. Sepsis Labs: No results for input(s): PROCALCITON, LATICACIDVEN in the last 168 hours.  Recent Results (from the past 240 hour(s))  MRSA PCR Screening     Status: None   Collection Time: 09/09/16  4:53 AM  Result Value Ref Range Status   MRSA by PCR NEGATIVE NEGATIVE Final    Comment:        The GeneXpert MRSA Assay (FDA approved for NASAL specimens only), is one component of a comprehensive MRSA colonization surveillance program. It is not intended to diagnose MRSA infection nor to guide or monitor treatment for MRSA infections.          Radiology Studies: Dg Chest 2 View  Result Date: 09/08/2016 CLINICAL DATA:  Chest pain and dyspnea today. History CABG in atrial fibrillation. EXAM: CHEST  2 VIEW COMPARISON:  08/19/2016 FINDINGS: The heart is enlarged. The patient is status post median sternotomy. Mild interstitial prominence without overt pulmonary edema, pneumonic consolidation, effusion or pneumothorax. Minimal atelectasis at the lung bases left greater right. No acute osseous abnormality. IMPRESSION: Bibasilar subsegmental atelectasis with stable  cardiomegaly. Aortic atherosclerosis.  No significant change from prior. Electronically Signed   By: Tollie Ethavid  Kwon M.D.   On: 09/08/2016 22:22        Scheduled Meds: . amiodarone  400 mg Oral BID  . apixaban  5 mg Oral BID  . atorvastatin  20 mg Oral Daily  . chlorthalidone  37.5 mg Oral Daily  . diltiazem  120 mg Oral Daily  . irbesartan  300 mg Oral QHS  . multivitamin with minerals  1 tablet Oral Daily  . omega-3 acid ethyl esters  1 g Oral BID  . oxyCODONE-acetaminophen  2 tablet Oral Once  . pantoprazole  40 mg Oral Daily  . sertraline  25 mg Oral Daily   Continuous Infusions:   LOS: 0 days    Time spent: 35 minutes.     Regalado,  Prentiss Bells, MD [facility] Pager [phone]  If 7PM-7AM, please contact night-coverage [email or web address] Password [facility] 09/10/2016, 2:34 PM

## 2016-09-10 NOTE — Progress Notes (Addendum)
SUBJECTIVE: The patient did well initially today but has since developed recurrent afib.  He has significant palpitations during afib which creates a sense of chest discomfort.  This improves with rate control of his afib.  Marland Kitchen. amiodarone  400 mg Oral BID  . apixaban  5 mg Oral BID  . atorvastatin  20 mg Oral Daily  . chlorthalidone  37.5 mg Oral Daily  . diltiazem  120 mg Oral Daily  . irbesartan  300 mg Oral QHS  . multivitamin with minerals  1 tablet Oral Daily  . omega-3 acid ethyl esters  1 g Oral BID  . oxyCODONE-acetaminophen  2 tablet Oral Once  . pantoprazole  40 mg Oral Daily  . sertraline  25 mg Oral Daily     OBJECTIVE: Physical Exam: Vitals:   09/10/16 0829 09/10/16 0830 09/10/16 1100 09/10/16 1358  BP: 139/68 139/68 128/64 (!) 155/114  Pulse: (!) 59 (!) 58 66 83  Resp: 15 17 (!) 21 (!) 25  Temp: 98.5 F (36.9 C)  98.4 F (36.9 C)   TempSrc: Oral  Oral   SpO2: 96% 97% 97% 100%  Weight:      Height:        Intake/Output Summary (Last 24 hours) at 09/10/16 1516 Last data filed at 09/10/16 0331  Gross per 24 hour  Intake                0 ml  Output              801 ml  Net             -801 ml    Telemetry reveals afib this afternoon with occasional 2:1 Atrial flutter  GEN- The patient is overweight appearing, alert and oriented x 3 today.   Head- normocephalic, atraumatic Eyes-  Sclera clear, conjunctiva pink Ears- hearing intact Oropharynx- clear Neck- supple,   Lungs- Clear to ausculation bilaterally, normal work of breathing Heart- irregular rate and rhythm, no murmurs, rubs or gallops, PMI not laterally displaced GI- soft, NT, ND, + BS Extremities- no clubbing, cyanosis, or edema Skin- no rash or lesion Psych- euthymic mood, full affect Neuro- strength and sensation are intact  LABS: Basic Metabolic Panel:  Recent Labs  40/98/1111/02/19 2118  NA 138  K 4.3  CL 106  CO2 23  GLUCOSE 133*  BUN 15  CREATININE 1.13  CALCIUM 9.8   Liver  Function Tests:  Recent Labs  09/08/16 2118  AST 27  ALT 27  ALKPHOS 74  BILITOT 0.5  PROT 7.9  ALBUMIN 3.4*   No results for input(s): LIPASE, AMYLASE in the last 72 hours. CBC:  Recent Labs  09/08/16 2124  WBC 13.4*  HGB 15.2  HCT 43.2  MCV 84.2  PLT 337   Cardiac Enzymes:  Recent Labs  09/09/16 0245 09/09/16 0707 09/09/16 1410  TROPONINI 0.03* <0.03 <0.03   RADIOLOGY: Dg Chest 2 View  Result Date: 09/08/2016 CLINICAL DATA:  Chest pain and dyspnea today. History CABG in atrial fibrillation. EXAM: CHEST  2 VIEW COMPARISON:  08/19/2016 FINDINGS: The heart is enlarged. The patient is status post median sternotomy. Mild interstitial prominence without overt pulmonary edema, pneumonic consolidation, effusion or pneumothorax. Minimal atelectasis at the lung bases left greater right. No acute osseous abnormality. IMPRESSION: Bibasilar subsegmental atelectasis with stable cardiomegaly. Aortic atherosclerosis.  No significant change from prior. Electronically Signed   By: Tollie Ethavid  Kwon M.D.   On: 09/08/2016 22:22   Dg Chest 2  View  Result Date: 08/19/2016 CLINICAL DATA:  Chest pain, shortness of breath. EXAM: CHEST  2 VIEW COMPARISON:  Radiograph of March 27, 2015. FINDINGS: Stable cardiomediastinal silhouette. Status post coronary artery bypass graft. Atherosclerosis of thoracic aorta is noted. No pneumothorax or pleural effusion is noted. Mild bibasilar subsegmental atelectasis is noted. Bony thorax is unremarkable. IMPRESSION: Aortic atherosclerosis.  Mild bibasilar subsegmental atelectasis. Electronically Signed   By: Lupita Raider, M.D.   On: 08/19/2016 11:56    ASSESSMENT AND PLAN:    1. Paroxysmal atrial fibrillation     The patient has symptomatic recurrent afib and also atrial flutter documented today.  He is very symptomatic.  He has significant fear related to afib.  I had a very long discussion with him today.  I have encouraged rate control.  We again discussed  risks of AAD therapy.  Amiodarone risks were discussed including but not limited to liver/lung/occular/thyroid toxicity and possibly even death.  He accepts risks and wishes to proceed. Continue oral amiodarone for now. Increase diltiazem for better rate control CHA2DS2Vasc is at least 2, on Eliquis Repeat echo pending  2. CAD     CP only with his Afib, otherwise asymptomatic     Negative stress test June 2016     Repeat echo pending  3. HTN     stable  EP to follow  Hillis Range, MD 09/10/2016 3:16 PM

## 2016-09-11 ENCOUNTER — Encounter (HOSPITAL_COMMUNITY): Payer: Self-pay | Admitting: Internal Medicine

## 2016-09-11 DIAGNOSIS — I10 Essential (primary) hypertension: Secondary | ICD-10-CM | POA: Diagnosis not present

## 2016-09-11 DIAGNOSIS — I480 Paroxysmal atrial fibrillation: Secondary | ICD-10-CM | POA: Diagnosis not present

## 2016-09-11 DIAGNOSIS — I483 Typical atrial flutter: Secondary | ICD-10-CM | POA: Diagnosis not present

## 2016-09-11 DIAGNOSIS — I208 Other forms of angina pectoris: Secondary | ICD-10-CM | POA: Diagnosis not present

## 2016-09-11 MED ORDER — AMIODARONE HCL 200 MG PO TABS
400.0000 mg | ORAL_TABLET | Freq: Three times a day (TID) | ORAL | Status: DC
  Administered 2016-09-11 – 2016-09-12 (×3): 400 mg via ORAL
  Filled 2016-09-11 (×3): qty 2

## 2016-09-11 MED ORDER — DILTIAZEM HCL ER COATED BEADS 120 MG PO CP24
120.0000 mg | ORAL_CAPSULE | Freq: Once | ORAL | Status: AC
  Administered 2016-09-11: 120 mg via ORAL
  Filled 2016-09-11: qty 1

## 2016-09-11 MED ORDER — DILTIAZEM HCL ER COATED BEADS 180 MG PO CP24
360.0000 mg | ORAL_CAPSULE | Freq: Every day | ORAL | Status: DC
  Administered 2016-09-12: 360 mg via ORAL
  Filled 2016-09-11: qty 2

## 2016-09-11 NOTE — Progress Notes (Signed)
Patient transferred to [id number] report called to Ray RN, family aware of transfer.  Jennefer Kopp, Kae Heller, RN

## 2016-09-11 NOTE — Progress Notes (Signed)
SUBJECTIVE: The patient felt well without symptoms with HR control, "as soon as I got out of bed it jumps up and I feel terrible"  . amiodarone  400 mg Oral BID  . apixaban  5 mg Oral BID  . atorvastatin  20 mg Oral Daily  . chlorthalidone  37.5 mg Oral Daily  . diltiazem  240 mg Oral Daily  . irbesartan  300 mg Oral QHS  . multivitamin with minerals  1 tablet Oral Daily  . omega-3 acid ethyl esters  1 g Oral BID  . oxyCODONE-acetaminophen  2 tablet Oral Once  . pantoprazole  40 mg Oral Daily  . sertraline  25 mg Oral Daily     OBJECTIVE: Physical Exam: Vitals:   09/10/16 2333 09/11/16 0259 09/11/16 0300 09/11/16 0735  BP: 109/64 109/68  117/69  Pulse: 69 (!) 58  60  Resp: 18 (!) 21  16  Temp: 98 F (36.7 C) 97.6 F (36.4 C)  98.3 F (36.8 C)  TempSrc: Oral Oral  Axillary  SpO2: 100% 100%  100%  Weight:   247 lb 1.6 oz (112.1 kg)   Height:        Intake/Output Summary (Last 24 hours) at 09/11/16 0853 Last data filed at 09/11/16 0300  Gross per 24 hour  Intake             1440 ml  Output                1 ml  Net             1439 ml    Telemetry reviewed by myself is AFib, 90'-110bpm, occ Atrial flutter  GEN- The patient is overweight, is in NAD, alert and oriented x 3 today.   Head- normocephalic, atraumatic Eyes-  Sclera clear, conjunctiva pink Ears- hearing intact Oropharynx- clear Neck- supple,   Lungs- Clear to ausculation bilaterally, normal work of breathing Heart- IRRR,  no murmurs, rubs or gallops, PMI not laterally displaced GI- soft, NT, ND Extremities- no clubbing, cyanosis, or edema Skin- no rash or lesion Psych- euthymic mood, full affect Neuro- strength and sensation are intact  LABS: Basic Metabolic Panel:  Recent Labs  38/46/65 2118  NA 138  K 4.3  CL 106  CO2 23  GLUCOSE 133*  BUN 15  CREATININE 1.13  CALCIUM 9.8   Liver Function Tests:  Recent Labs  09/08/16 2118  AST 27  ALT 27  ALKPHOS 74  BILITOT 0.5  PROT 7.9    ALBUMIN 3.4*   No results for input(s): LIPASE, AMYLASE in the last 72 hours. CBC:  Recent Labs  09/08/16 2124  WBC 13.4*  HGB 15.2  HCT 43.2  MCV 84.2  PLT 337   Cardiac Enzymes:  Recent Labs  09/09/16 0245 09/09/16 0707 09/09/16 1410  TROPONINI 0.03* <0.03 <0.03   09/10/16: TTE Study Conclusions - Left ventricle: The cavity size was mildly dilated. There was   mild concentric hypertrophy. Systolic function was normal. The   estimated ejection fraction was in the range of 60% to 65%. Wall   motion was normal; there were no regional wall motion   abnormalities. Features are consistent with a pseudonormal left   ventricular filling pattern, with concomitant abnormal relaxation   and increased filling pressure (grade 2 diastolic dysfunction).   Doppler parameters are consistent with high ventricular filling   pressure. - Pulmonary arteries: Systolic pressure could not be accurately   estimated.  No significant VHD described,  LA size 40mm    ASSESSMENT AND PLAN:    1. Paroxysmal atrial fibrillation     The patient has very symptomatic recurrent afib and also atrial flutter documented     He has significant fear related to afib.      Dr. Johney FrameAllred had a very long discussion the patient yesterday regarding treatment options     Encouraged rate control, discussed risks of AAD therapy.  Amiodarone risks were discussed including but not limited to liver/lung/occular/thyroid toxicity and possibly even death.  He accepted risks and wishes to proceed. Currently with PO amio load (400mg  BID), dilt at 240mg  daily CHA2DS2Vasc is at least 2, on Eliquis Echo as above  The patient expresses his frustration and fear of never being able to feel good again.  Feels like the medicines should have worked by now.  We had a lengthy discussion on time for amio particularly to load.  He has concerns of long term amio use and would like to pursue ablation in effort to minimize hopefully his  need the amio.  Though remains OK for short term use.  2. CAD (hx of CABG 2004)     CP only with his Afib, otherwise asymptomatic     Negative stress test June 2016     Repeat echo pending  3. HTN     stable  EP will continue to follow  Sheilah PigeonRenee Lynn Ursuy, PA-C 09/11/2016 8:53 AM   I have seen, examined the patient, and reviewed the above assessment and plan.  On exam, remains anxious.  Tachycardic irregular rhythm.  Changes to above are made where necessary.   Will increase diltiazem and amiodarone for rate control.  Given CAD, I have advised beta blocker.  He states "they make me feel horrible, I cannot take them". Will make NPO for possible cardioversion tomorrow. He is very stable for transfer to telemetry. As he has no other active issues, I am happy to place on cardiology/ EP service for the weekend.  Co Sign: Hillis RangeJames Nicolaos Mitrano, MD 09/11/2016 10:02 AM

## 2016-09-11 NOTE — Progress Notes (Signed)
Discussed with Dr Johney Frame, patient will be on EP service. Please call us back as needed.  Chesney Klimaszewski, Md.

## 2016-09-12 DIAGNOSIS — Z6841 Body Mass Index (BMI) 40.0 and over, adult: Secondary | ICD-10-CM | POA: Diagnosis not present

## 2016-09-12 DIAGNOSIS — I25119 Atherosclerotic heart disease of native coronary artery with unspecified angina pectoris: Secondary | ICD-10-CM | POA: Diagnosis not present

## 2016-09-12 DIAGNOSIS — I483 Typical atrial flutter: Secondary | ICD-10-CM

## 2016-09-12 DIAGNOSIS — Z951 Presence of aortocoronary bypass graft: Secondary | ICD-10-CM | POA: Diagnosis not present

## 2016-09-12 DIAGNOSIS — I208 Other forms of angina pectoris: Secondary | ICD-10-CM | POA: Diagnosis not present

## 2016-09-12 DIAGNOSIS — K219 Gastro-esophageal reflux disease without esophagitis: Secondary | ICD-10-CM | POA: Diagnosis not present

## 2016-09-12 DIAGNOSIS — G4733 Obstructive sleep apnea (adult) (pediatric): Secondary | ICD-10-CM | POA: Diagnosis not present

## 2016-09-12 DIAGNOSIS — I10 Essential (primary) hypertension: Secondary | ICD-10-CM | POA: Diagnosis not present

## 2016-09-12 DIAGNOSIS — I480 Paroxysmal atrial fibrillation: Secondary | ICD-10-CM | POA: Diagnosis not present

## 2016-09-12 DIAGNOSIS — D72829 Elevated white blood cell count, unspecified: Secondary | ICD-10-CM | POA: Diagnosis not present

## 2016-09-12 DIAGNOSIS — R001 Bradycardia, unspecified: Secondary | ICD-10-CM | POA: Diagnosis not present

## 2016-09-12 DIAGNOSIS — I119 Hypertensive heart disease without heart failure: Secondary | ICD-10-CM | POA: Diagnosis not present

## 2016-09-12 LAB — CBC
HCT: 41.3 % (ref 39.0–52.0)
HEMOGLOBIN: 14 g/dL (ref 13.0–17.0)
MCH: 28.6 pg (ref 26.0–34.0)
MCHC: 33.9 g/dL (ref 30.0–36.0)
MCV: 84.5 fL (ref 78.0–100.0)
PLATELETS: 296 10*3/uL (ref 150–400)
RBC: 4.89 MIL/uL (ref 4.22–5.81)
RDW: 13.8 % (ref 11.5–15.5)
WBC: 11.9 10*3/uL — AB (ref 4.0–10.5)

## 2016-09-12 LAB — BASIC METABOLIC PANEL
Anion gap: 11 (ref 5–15)
BUN: 20 mg/dL (ref 6–20)
CHLORIDE: 101 mmol/L (ref 101–111)
CO2: 26 mmol/L (ref 22–32)
Calcium: 9.4 mg/dL (ref 8.9–10.3)
Creatinine, Ser: 1.05 mg/dL (ref 0.61–1.24)
Glucose, Bld: 120 mg/dL — ABNORMAL HIGH (ref 65–99)
POTASSIUM: 3.8 mmol/L (ref 3.5–5.1)
SODIUM: 138 mmol/L (ref 135–145)

## 2016-09-12 MED ORDER — DILTIAZEM HCL ER COATED BEADS 360 MG PO CP24: 360.0000 mg | ORAL_CAPSULE | Freq: Every day | ORAL | 11 refills | Status: DC

## 2016-09-12 MED ORDER — AMIODARONE HCL 200 MG PO TABS: 400.0000 mg | ORAL_TABLET | Freq: Three times a day (TID) | ORAL | 6 refills | Status: DC

## 2016-09-12 MED ORDER — METOPROLOL TARTRATE 25 MG PO TABS
25.0000 mg | ORAL_TABLET | Freq: Once | ORAL | Status: AC
  Administered 2016-09-12: 25 mg via ORAL
  Filled 2016-09-12: qty 1

## 2016-09-12 NOTE — Progress Notes (Signed)
SUBJECTIVE: The patient is a little less anxious today.  Seems to be better tolerating his afib as V rates are better controlled.  He denies CP or SOB and wants to go home.  As he is intermittently in sinus, cardioversion has been cancelled.  Marland Kitchen amiodarone  400 mg Oral TID  . apixaban  5 mg Oral BID  . atorvastatin  20 mg Oral Daily  . chlorthalidone  37.5 mg Oral Daily  . diltiazem  360 mg Oral Daily  . irbesartan  300 mg Oral QHS  . multivitamin with minerals  1 tablet Oral Daily  . omega-3 acid ethyl esters  1 g Oral BID  . oxyCODONE-acetaminophen  2 tablet Oral Once  . pantoprazole  40 mg Oral Daily  . sertraline  25 mg Oral Daily     OBJECTIVE: Physical Exam: Vitals:   09/11/16 1800 09/11/16 1839 09/11/16 2016 09/12/16 0500  BP:  (!) 138/57 (!) 141/72 (!) 131/58  Pulse: 71 65 64 (!) 57  Resp: 19 18 18 18   Temp:  98.5 F (36.9 C) 97.7 F (36.5 C) 98.2 F (36.8 C)  TempSrc:  Oral Oral Axillary  SpO2: 95% 99% 98% 94%  Weight:  238 lb 9.6 oz (108.2 kg)  239 lb (108.4 kg)  Height:  5\' 5"  (1.651 m)      Intake/Output Summary (Last 24 hours) at 09/12/16 1045 Last data filed at 09/12/16 0600  Gross per 24 hour  Intake              125 ml  Output                0 ml  Net              125 ml    Telemetry reviewed and reveals AFib, 90'-110bpm though he was mostly in sinus yesterday afternoon and throughout the night  GEN- The patient is overweight, is in NAD, alert and oriented x 3 today.   Head- normocephalic, atraumatic Eyes-  Sclera clear, conjunctiva pink Ears- hearing intact Oropharynx- clear Neck- supple,   Lungs- Clear to ausculation bilaterally, normal work of breathing Heart- IRRR,  no murmurs, rubs or gallops, PMI not laterally displaced GI- soft, NT, ND Extremities- no clubbing, cyanosis, or edema Skin- no rash or lesion Psych- euthymic mood, full affect Neuro- strength and sensation are intact  LABS: Basic Metabolic Panel:  Recent Labs   09/12/16 0413  NA 138  K 3.8  CL 101  CO2 26  GLUCOSE 120*  BUN 20  CREATININE 1.05  CALCIUM 9.4   CBC:  Recent Labs  09/12/16 0413  WBC 11.9*  HGB 14.0  HCT 41.3  MCV 84.5  PLT 296   Cardiac Enzymes:  Recent Labs  09/09/16 1410  TROPONINI <0.03   09/10/16: TTE Study Conclusions - Left ventricle: The cavity size was mildly dilated. There was   mild concentric hypertrophy. Systolic function was normal. The   estimated ejection fraction was in the range of 60% to 65%. Wall   motion was normal; there were no regional wall motion   abnormalities. Features are consistent with a pseudonormal left   ventricular filling pattern, with concomitant abnormal relaxation   and increased filling pressure (grade 2 diastolic dysfunction).   Doppler parameters are consistent with high ventricular filling   pressure. - Pulmonary arteries: Systolic pressure could not be accurately   estimated.  No significant VHD described, LA size 15mm    ASSESSMENT AND PLAN:  1. Paroxysmal atrial fibrillation  The patient has very symptomatic recurrent afib and also atrial flutter.  He is doing much better with diltiazem and amiodarone for rate control.  He wants to go home. I would discharge on current medicines.  He can take metoprolol prn in addition for elevated V rates with symptoms.  Will schedule for AF ablation at the next available time which will be in January. CHA2DS2Vasc is at least 2, on Eliquis  Amiodarone should be 400mg  TID x 7 days then 400mg  BID x 7 days then 200mg  BID x 2 weeks then 200mg  daily.  2. CAD (hx of CABG 2004)     CP only with his Afib, otherwise asymptomatic     Negative stress test June 2016  3. HTN     stable  DC to home Follow-up in AF clinic this coming Wednesday.   Hillis RangeJames Leontae Bostock, MD 09/12/2016 10:45 AM

## 2016-09-12 NOTE — Discharge Summary (Signed)
Discharge Summary    Patient ID: [name] SimmondsDanny R Stay,  MRN: [id number], DOB/AGE: 63/06/1951 63 y.o.  Admit date: 09/08/2016 Discharge date: 09/12/2016  Primary Care Provider: No PCP Per Patient Primary Cardiologist: Dr. Wyline MoodBranch / Dr. Johney FrameAllred (EP)   Discharge Diagnoses    Principal Problem:   Atrial fibrillation with RVR (HCC) Active Problems:   Hyperlipidemia with target LDL less than 70   Obstructive sleep apnea   Essential hypertension   Coronary atherosclerosis   GERD   Chronic sinus bradycardia   Morbidly obese (HCC)   Paroxysmal atrial fibrillation (HCC)   Chest pain   Allergies Allergies  Allergen Reactions  . Nsaids Other (See Comments)    On blood thinner  . Vancomycin Other (See Comments)    Was informed after heart surgery ? Breathing problem/patient unsure     History of Present Illness     [name] [name] is a 65 y.o. male CAD s/p [facility] with low risk nuclear stress test in 2016, paroxysmal atrial fibrillation, obstructive sleep apnea fully compliant with CPAP, HTN, HLD who [facility]16 with chest pain and found to be in afib with RVR.   He was hospitalized recently for chest pain, discharged 08/22/16. He was found to be back in atrial fib with RVR and converted to NSR in the ED. His EKG was without ischemic changes. He was admitted for initiation of antiarrhythmic therapy with sotalol at 80mg  BID. His Toprol was stopped to allow for this. His diltiazem dose was decreased for sinus bradycardia. TSH was normal, and was on Eliquis for a/c and r/o for ACS. His celexa was switched to sertraline due to QTc prolongation with the former, and discharged then maintaining NSR with QTc 500msec.    He f/u with Rudi Cocoonna Carroll, NP 09/01/16 with reports ofepisodes of afib since d/c. The shortest was 5 mins and the longest was almost 3 hours. Usually will return to SR with an as needed metoprolol. He does have CPAP that he uses religiously.  Occasional beer. Exercises 3x a week on a regular basis at the gym but struggles to lose weight.  Given short duration of episodes and QTc of 500ms no up-titration of his sotalol was done and instructed to continue PRN metoprolol.  Discussed ablation as possible next step, though he was not interested at that visit.  He returned again 09/09/16 with c/o CP.  He spontaneously CV to SR in the ED but reports of brief bursts of AF observed.  The patient reports that he knows the second he goes into AFib, and the episodes in the last couple months are more frequent and getting longer lasting.  Associated with racing heart beats, chest pressure and weakness.  He states the chest pressure is exactly the same/symptoms are exactly the same as back in June 2016 when he was 1st diagnosed with AFib RVR (had a negative stress test.  He reports this event more scary since he has been having so much of it lately and his chest was so heavy he thought he was "going to die"  When he is not in AFib he feels very good.   He was admitted for further work up and management.   Hospital Course     Consultants: IM  1. Paroxysmal atrial fibrillation:The patient has very symptomatic recurrent afib and also atrial flutter. His sotolol was discontinued and he was started on Amiodarone oral load. Plan to continue amiodarone until planned ablation  sometime in January. He can take metoprolol prn in addition for elevated V rates with symptoms. He spontaneously converted on amiodarone load.  -- CHA2DS2Vasc is at least 2, on Eliquis. Continue Amiodarone 400mg  TID x 7 days then 400mg  BID x 7 days then 200mg  BID x 2 weeks then 200mg  daily. -- Cardizem was increased from 120 mg qd up to 360 mg qd. -- He sees Rudi Coco  In afib clinic on 12/18  2. CAD (hx of CABG 2004): CP only with his Afib, otherwise asymptomatic. Negative stress test June 2016  3. HTN: stable  4. Morbid obesity: Body mass index is 39.77 kg/m.   5. OSA:  compliant with CPAP   The patient has had an uncomplicated hospital course and is recovering well. He has been seen by Dr. Johney Frame today and deemed ready for discharge home. All follow-up appointments have been scheduled. Discharge medications are listed below.  _____________  Discharge Vitals Blood pressure (!) 131/58, pulse (!) 57, temperature 98.2 F (36.8 C), temperature source Axillary, resp. rate 18, height 5\' 5"  (1.651 m), weight 239 lb (108.4 kg), SpO2 94 %.  Filed Weights   09/11/16 0300 09/11/16 1839 09/12/16 0500  Weight: 247 lb 1.6 oz (112.1 kg) 238 lb 9.6 oz (108.2 kg) 239 lb (108.4 kg)    Labs & Radiologic Studies     CBC  Recent Labs  09/12/16 0413  WBC 11.9*  HGB 14.0  HCT 41.3  MCV 84.5  PLT 296   Basic Metabolic Panel  Recent Labs  09/12/16 0413  NA 138  K 3.8  CL 101  CO2 26  GLUCOSE 120*  BUN 20  CREATININE 1.05  CALCIUM 9.4   Cardiac Enzymes  Recent Labs  09/09/16 1410  TROPONINI <0.03    Dg Chest 2 View  Result Date: 09/08/2016 CLINICAL DATA:  Chest pain and dyspnea today. History CABG in atrial fibrillation. EXAM: CHEST  2 VIEW COMPARISON:  08/19/2016 FINDINGS: The heart is enlarged. The patient is status post median sternotomy. Mild interstitial prominence without overt pulmonary edema, pneumonic consolidation, effusion or pneumothorax. Minimal atelectasis at the lung bases left greater right. No acute osseous abnormality. IMPRESSION: Bibasilar subsegmental atelectasis with stable cardiomegaly. Aortic atherosclerosis.  No significant change from prior. Electronically Signed   By: Tollie Eth M.D.   On: 09/08/2016 22:22   Dg Chest 2 View  Result Date: 08/19/2016 CLINICAL DATA:  Chest pain, shortness of breath. EXAM: CHEST  2 VIEW COMPARISON:  Radiograph of March 27, 2015. FINDINGS: Stable cardiomediastinal silhouette. Status post coronary artery bypass graft. Atherosclerosis of thoracic aorta is noted. No pneumothorax or pleural effusion  is noted. Mild bibasilar subsegmental atelectasis is noted. Bony thorax is unremarkable. IMPRESSION: Aortic atherosclerosis.  Mild bibasilar subsegmental atelectasis. Electronically Signed   By: Lupita Raider, M.D.   On: 08/19/2016 11:56     Diagnostic Studies/Procedures    none _____________    Disposition   Pt is being discharged home today in good condition.  Follow-up Plans & Appointments    Follow-up Information    Hillis Range, MD Follow up in 1 week(s).   Specialty:  Cardiology Contact information: [address] [place] [address] [phone]        [facility] Follow up on 09/21/2016.   Specialty:  Cardiology Why:  10:30AM Contact information: [address] [id number] mc [place] [address] [phone]         Discharge Instructions  Diet - low sodium heart healthy    Complete by:  As directed    Increase activity slowly    Complete by:  As directed       Discharge Medications     Medication List    STOP taking these medications   sotalol 80 MG tablet Commonly known as:  BETAPACE     TAKE these medications   acetaminophen 650 MG CR tablet Commonly known as:  TYLENOL Take 650 mg by mouth every 8 (eight) hours as needed for pain.   allopurinol 100 MG tablet Commonly known as:  ZYLOPRIM Take 100 mg by mouth daily as needed (for gout).   amiodarone 200 MG tablet Commonly known as:  PACERONE Take 2 tablets (400 mg total) by mouth 3 (three) times daily. 400 mg tid x 1 wk, 400 mg bid x 1 wk, 200 mg bid x 2 wks, then 200 mg qd.   apixaban 5 MG Tabs tablet Commonly known as:  ELIQUIS Take 1 tablet (5 mg total) by mouth 2 (two) times daily.   atorvastatin 20 MG tablet Commonly known as:  LIPITOR Take 1 tablet (20 mg total) by mouth daily.   chlorthalidone 25 MG tablet Commonly known as:  HYGROTON Take 1.5 tablets (37.5 mg total) by mouth daily.   Co Q 10 100 MG  Caps Take 400 mg by mouth daily.   diltiazem 360 MG 24 hr capsule Commonly known as:  CARDIZEM CD Take 1 capsule (360 mg total) by mouth daily. What changed:  medication strength  how much to take   fish oil-omega-3 fatty acids 1000 MG capsule Take 3 g by mouth 2 (two) times daily.   irbesartan 300 MG tablet Commonly known as:  AVAPRO Take 1 tablet (300 mg total) by mouth at bedtime.   metoprolol tartrate 25 MG tablet Commonly known as:  LOPRESSOR Take 1 tablet (25 mg total) by mouth every 8 (eight) hours as needed. Notes to patient:  Last Dose given at 12:10 pm today   multivitamin tablet Take 1 tablet by mouth daily.   omeprazole 20 MG capsule Commonly known as:  PRILOSEC Take 20 mg by mouth every other day. Notes to patient:  As Before, As Directed   sertraline 25 MG tablet Commonly known as:  ZOLOFT Take 1 tablet (25 mg total) by mouth daily.          Outstanding Labs/Studies   none  Duration of Discharge Encounter   Greater than 30 minutes including physician time.  Signed, Leanna Battles 09/12/2016, 12:45 PM   Hillis Range MD, [facility] 09/12/2016 2:17 PM

## 2016-09-12 NOTE — Progress Notes (Signed)
Pt noted in Afib on monitor arounf 0640. Pt was SB throughout the night. Pt was up walking in hall. HR in 110-120s. HR in 70s at rest. Will continue to monitor and update day nurse.

## 2016-09-13 DIAGNOSIS — G4733 Obstructive sleep apnea (adult) (pediatric): Secondary | ICD-10-CM | POA: Diagnosis not present

## 2016-09-14 ENCOUNTER — Other Ambulatory Visit: Payer: Self-pay | Admitting: *Deleted

## 2016-09-14 ENCOUNTER — Other Ambulatory Visit: Payer: Self-pay | Admitting: Nurse Practitioner

## 2016-09-14 ENCOUNTER — Other Ambulatory Visit: Payer: Self-pay | Admitting: Physician Assistant

## 2016-09-14 ENCOUNTER — Telehealth: Payer: Self-pay | Admitting: Nurse Practitioner

## 2016-09-14 MED ORDER — SERTRALINE HCL 25 MG PO TABS: 25.0000 mg | ORAL_TABLET | Freq: Every day | ORAL | 1 refills | Status: DC

## 2016-09-14 NOTE — Telephone Encounter (Signed)
Called patient and rescheduled AF ablation to tomorrow 2nd case. TEE scheduled with Dr Jens Som for tomorrow morning at [facility]. Pt to arrive at [facility] tomorrow morning. NPO after midnight. Hold Eliquis tomorrow morning. Other meds ok to take with sip of water.   Gypsy Balsam, NP 09/14/2016 9:23 AM

## 2016-09-15 ENCOUNTER — Encounter (HOSPITAL_COMMUNITY)
Admission: RE | Disposition: A | Discharge status: Home / Self Care | Payer: Self-pay | Source: Ambulatory Visit | Attending: Cardiology

## 2016-09-15 ENCOUNTER — Ambulatory Visit (HOSPITAL_COMMUNITY): Payer: Medicare HMO | Admitting: Certified Registered"

## 2016-09-15 ENCOUNTER — Encounter (HOSPITAL_COMMUNITY): Payer: Self-pay

## 2016-09-15 ENCOUNTER — Ambulatory Visit (HOSPITAL_BASED_OUTPATIENT_CLINIC_OR_DEPARTMENT_OTHER)
Admission: RE | Admit: 2016-09-15 | Discharge: 2016-09-15 | Disposition: A | Discharge status: Home / Self Care | Payer: Medicare HMO | Source: Ambulatory Visit | Attending: Nurse Practitioner | Admitting: Nurse Practitioner

## 2016-09-15 ENCOUNTER — Ambulatory Visit (HOSPITAL_COMMUNITY)
Admission: RE | Admit: 2016-09-15 | Discharge: 2016-09-16 | Disposition: A | Discharge status: Home / Self Care | Payer: Medicare HMO | Source: Ambulatory Visit | Attending: Cardiology | Admitting: Cardiology

## 2016-09-15 DIAGNOSIS — F419 Anxiety disorder, unspecified: Secondary | ICD-10-CM | POA: Diagnosis not present

## 2016-09-15 DIAGNOSIS — I252 Old myocardial infarction: Secondary | ICD-10-CM | POA: Diagnosis not present

## 2016-09-15 DIAGNOSIS — I2510 Atherosclerotic heart disease of native coronary artery without angina pectoris: Secondary | ICD-10-CM | POA: Diagnosis not present

## 2016-09-15 DIAGNOSIS — Z881 Allergy status to other antibiotic agents status: Secondary | ICD-10-CM | POA: Diagnosis not present

## 2016-09-15 DIAGNOSIS — Z7901 Long term (current) use of anticoagulants: Secondary | ICD-10-CM | POA: Insufficient documentation

## 2016-09-15 DIAGNOSIS — E785 Hyperlipidemia, unspecified: Secondary | ICD-10-CM | POA: Diagnosis not present

## 2016-09-15 DIAGNOSIS — I483 Typical atrial flutter: Secondary | ICD-10-CM | POA: Diagnosis not present

## 2016-09-15 DIAGNOSIS — Z8249 Family history of ischemic heart disease and other diseases of the circulatory system: Secondary | ICD-10-CM | POA: Diagnosis not present

## 2016-09-15 DIAGNOSIS — I480 Paroxysmal atrial fibrillation: Secondary | ICD-10-CM | POA: Diagnosis not present

## 2016-09-15 DIAGNOSIS — M1990 Unspecified osteoarthritis, unspecified site: Secondary | ICD-10-CM | POA: Diagnosis not present

## 2016-09-15 DIAGNOSIS — I4891 Unspecified atrial fibrillation: Secondary | ICD-10-CM

## 2016-09-15 DIAGNOSIS — Z6839 Body mass index (BMI) 39.0-39.9, adult: Secondary | ICD-10-CM | POA: Insufficient documentation

## 2016-09-15 DIAGNOSIS — I10 Essential (primary) hypertension: Secondary | ICD-10-CM | POA: Insufficient documentation

## 2016-09-15 DIAGNOSIS — I509 Heart failure, unspecified: Secondary | ICD-10-CM | POA: Diagnosis not present

## 2016-09-15 DIAGNOSIS — K219 Gastro-esophageal reflux disease without esophagitis: Secondary | ICD-10-CM | POA: Diagnosis not present

## 2016-09-15 DIAGNOSIS — G4733 Obstructive sleep apnea (adult) (pediatric): Secondary | ICD-10-CM | POA: Diagnosis not present

## 2016-09-15 DIAGNOSIS — I110 Hypertensive heart disease with heart failure: Secondary | ICD-10-CM | POA: Diagnosis not present

## 2016-09-15 DIAGNOSIS — Z951 Presence of aortocoronary bypass graft: Secondary | ICD-10-CM | POA: Diagnosis not present

## 2016-09-15 HISTORY — PX: TEE WITHOUT CARDIOVERSION: SHX5443

## 2016-09-15 HISTORY — PX: ELECTROPHYSIOLOGIC STUDY: SHX172A

## 2016-09-15 LAB — POCT ACTIVATED CLOTTING TIME
ACTIVATED CLOTTING TIME: 274 s
Activated Clotting Time: 263 seconds
Activated Clotting Time: 180 seconds

## 2016-09-15 SURGERY — ATRIAL FIBRILLATION ABLATION
Anesthesia: Monitor Anesthesia Care

## 2016-09-15 SURGERY — ECHOCARDIOGRAM, TRANSESOPHAGEAL
Anesthesia: Moderate Sedation

## 2016-09-15 MED ORDER — SODIUM CHLORIDE 0.9% FLUSH
3.0000 mL | INTRAVENOUS | Status: DC | PRN
  Administered 2016-09-15: 3 mL via INTRAVENOUS
  Filled 2016-09-15: qty 3

## 2016-09-15 MED ORDER — PROTAMINE SULFATE 10 MG/ML IV SOLN
INTRAVENOUS | Status: DC | PRN
  Administered 2016-09-15: 30 mg via INTRAVENOUS

## 2016-09-15 MED ORDER — AMIODARONE HCL 200 MG PO TABS: 400.0000 mg | ORAL_TABLET | Freq: Three times a day (TID) | ORAL | Status: DC

## 2016-09-15 MED ORDER — AMIODARONE HCL 200 MG PO TABS
400.0000 mg | ORAL_TABLET | Freq: Three times a day (TID) | ORAL | Status: DC
  Administered 2016-09-15 – 2016-09-16 (×2): 400 mg via ORAL
  Filled 2016-09-15 (×2): qty 2

## 2016-09-15 MED ORDER — EPHEDRINE SULFATE 50 MG/ML IJ SOLN
INTRAMUSCULAR | Status: DC | PRN
  Administered 2016-09-15 (×2): 10 mg via INTRAVENOUS

## 2016-09-15 MED ORDER — DEXTROSE 5 % IV SOLN
INTRAVENOUS | Status: DC | PRN
  Administered 2016-09-15: 10 ug/min via INTRAVENOUS

## 2016-09-15 MED ORDER — SODIUM CHLORIDE 0.9 % IV SOLN: INTRAVENOUS | Status: DC

## 2016-09-15 MED ORDER — HEPARIN SODIUM (PORCINE) 1000 UNIT/ML IJ SOLN
INTRAMUSCULAR | Status: AC
  Filled 2016-09-15: qty 1

## 2016-09-15 MED ORDER — PHENYLEPHRINE HCL 10 MG/ML IJ SOLN
INTRAMUSCULAR | Status: DC | PRN
  Administered 2016-09-15: 40 ug via INTRAVENOUS
  Administered 2016-09-15 (×2): 80 ug via INTRAVENOUS
  Administered 2016-09-15: 40 ug via INTRAVENOUS
  Administered 2016-09-15 (×3): 80 ug via INTRAVENOUS
  Administered 2016-09-15: 40 ug via INTRAVENOUS

## 2016-09-15 MED ORDER — SODIUM CHLORIDE 0.9 % IV SOLN
INTRAVENOUS | Status: DC | PRN
  Administered 2016-09-15: 11:00:00 via INTRAVENOUS

## 2016-09-15 MED ORDER — SODIUM CHLORIDE 0.9 % IV SOLN: 250.0000 mL | INTRAVENOUS | Status: DC | PRN

## 2016-09-15 MED ORDER — MIDAZOLAM HCL 5 MG/ML IJ SOLN
INTRAMUSCULAR | Status: AC
  Filled 2016-09-15: qty 2

## 2016-09-15 MED ORDER — MIDAZOLAM HCL 10 MG/2ML IJ SOLN
INTRAMUSCULAR | Status: DC | PRN
  Administered 2016-09-15 (×3): 2 mg via INTRAVENOUS

## 2016-09-15 MED ORDER — IRBESARTAN 150 MG PO TABS
300.0000 mg | ORAL_TABLET | Freq: Every day | ORAL | Status: DC
  Administered 2016-09-15: 300 mg via ORAL
  Filled 2016-09-15: qty 2

## 2016-09-15 MED ORDER — SUCCINYLCHOLINE CHLORIDE 20 MG/ML IJ SOLN
INTRAMUSCULAR | Status: DC | PRN
  Administered 2016-09-15: 100 mg via INTRAVENOUS

## 2016-09-15 MED ORDER — HYDROCODONE-ACETAMINOPHEN 5-325 MG PO TABS: 1.0000 | ORAL_TABLET | ORAL | Status: DC | PRN

## 2016-09-15 MED ORDER — ONDANSETRON HCL 4 MG/2ML IJ SOLN: 4.0000 mg | Freq: Four times a day (QID) | INTRAMUSCULAR | Status: DC | PRN

## 2016-09-15 MED ORDER — PROMETHAZINE HCL 25 MG/ML IJ SOLN: 6.2500 mg | INTRAMUSCULAR | Status: DC | PRN

## 2016-09-15 MED ORDER — BUPIVACAINE HCL (PF) 0.25 % IJ SOLN
INTRAMUSCULAR | Status: DC | PRN
  Administered 2016-09-15: 15 mL

## 2016-09-15 MED ORDER — APIXABAN 5 MG PO TABS
5.0000 mg | ORAL_TABLET | Freq: Two times a day (BID) | ORAL | Status: DC
  Administered 2016-09-15 – 2016-09-16 (×2): 5 mg via ORAL
  Filled 2016-09-15 (×2): qty 1

## 2016-09-15 MED ORDER — ROCURONIUM BROMIDE 100 MG/10ML IV SOLN
INTRAVENOUS | Status: DC | PRN
  Administered 2016-09-15: 10 mg via INTRAVENOUS
  Administered 2016-09-15: 100 mg via INTRAVENOUS

## 2016-09-15 MED ORDER — HEPARIN SODIUM (PORCINE) 1000 UNIT/ML IJ SOLN
INTRAMUSCULAR | Status: DC | PRN
  Administered 2016-09-15: 1000 [IU] via INTRAVENOUS
  Administered 2016-09-15: 12000 [IU] via INTRAVENOUS
  Administered 2016-09-15: 1000 [IU] via INTRAVENOUS

## 2016-09-15 MED ORDER — OXYCODONE HCL 5 MG PO TABS: 5.0000 mg | ORAL_TABLET | Freq: Once | ORAL | Status: DC | PRN

## 2016-09-15 MED ORDER — IOPAMIDOL (ISOVUE-370) INJECTION 76%
INTRAVENOUS | Status: AC
  Filled 2016-09-15: qty 125

## 2016-09-15 MED ORDER — FENTANYL CITRATE (PF) 100 MCG/2ML IJ SOLN
INTRAMUSCULAR | Status: DC | PRN
  Administered 2016-09-15 (×2): 50 ug via INTRAVENOUS

## 2016-09-15 MED ORDER — HEPARIN SODIUM (PORCINE) 1000 UNIT/ML IJ SOLN
INTRAMUSCULAR | Status: DC | PRN
  Administered 2016-09-15: 4 mL via INTRAVENOUS
  Administered 2016-09-15: 3 mL via INTRAVENOUS

## 2016-09-15 MED ORDER — IOPAMIDOL (ISOVUE-370) INJECTION 76%
INTRAVENOUS | Status: DC | PRN
  Administered 2016-09-15: 3 mL via INTRAVENOUS
  Administered 2016-09-15: 100 mL via INTRAVENOUS

## 2016-09-15 MED ORDER — MEPERIDINE HCL 25 MG/ML IJ SOLN: 6.2500 mg | INTRAMUSCULAR | Status: DC | PRN

## 2016-09-15 MED ORDER — AMIODARONE HCL 200 MG PO TABS: 200.0000 mg | ORAL_TABLET | Freq: Two times a day (BID) | ORAL | Status: DC

## 2016-09-15 MED ORDER — BUPIVACAINE HCL (PF) 0.25 % IJ SOLN
INTRAMUSCULAR | Status: AC
  Filled 2016-09-15: qty 30

## 2016-09-15 MED ORDER — SODIUM CHLORIDE 0.9% FLUSH
3.0000 mL | Freq: Two times a day (BID) | INTRAVENOUS | Status: DC
  Administered 2016-09-15 – 2016-09-16 (×2): 3 mL via INTRAVENOUS

## 2016-09-15 MED ORDER — FENTANYL CITRATE (PF) 100 MCG/2ML IJ SOLN
INTRAMUSCULAR | Status: DC | PRN
  Administered 2016-09-15 (×2): 25 ug via INTRAVENOUS

## 2016-09-15 MED ORDER — CHLORTHALIDONE 25 MG PO TABS
37.5000 mg | ORAL_TABLET | Freq: Every day | ORAL | Status: DC
  Administered 2016-09-16: 37.5 mg via ORAL
  Filled 2016-09-15 (×2): qty 1.5

## 2016-09-15 MED ORDER — LACTATED RINGERS IV SOLN
INTRAVENOUS | Status: DC | PRN
  Administered 2016-09-15 (×2): via INTRAVENOUS

## 2016-09-15 MED ORDER — SERTRALINE HCL 25 MG PO TABS
25.0000 mg | ORAL_TABLET | Freq: Every day | ORAL | Status: DC
  Administered 2016-09-16: 25 mg via ORAL
  Filled 2016-09-15: qty 1

## 2016-09-15 MED ORDER — SUGAMMADEX SODIUM 500 MG/5ML IV SOLN
INTRAVENOUS | Status: DC | PRN
  Administered 2016-09-15: 220 mg via INTRAVENOUS

## 2016-09-15 MED ORDER — AMIODARONE HCL 200 MG PO TABS: 200.0000 mg | ORAL_TABLET | Freq: Every day | ORAL | Status: DC

## 2016-09-15 MED ORDER — MIDAZOLAM HCL 5 MG/5ML IJ SOLN
INTRAMUSCULAR | Status: DC | PRN
  Administered 2016-09-15 (×2): 1 mg via INTRAVENOUS

## 2016-09-15 MED ORDER — HYDROMORPHONE HCL 1 MG/ML IJ SOLN: 0.2500 mg | INTRAMUSCULAR | Status: DC | PRN

## 2016-09-15 MED ORDER — IRBESARTAN 150 MG PO TABS: 300.0000 mg | ORAL_TABLET | Freq: Every day | ORAL | Status: DC

## 2016-09-15 MED ORDER — AMIODARONE HCL 200 MG PO TABS: 400.0000 mg | ORAL_TABLET | Freq: Two times a day (BID) | ORAL | Status: DC

## 2016-09-15 MED ORDER — PROPOFOL 10 MG/ML IV BOLUS
INTRAVENOUS | Status: DC | PRN
  Administered 2016-09-15: 160 mg via INTRAVENOUS

## 2016-09-15 MED ORDER — BUTAMBEN-TETRACAINE-BENZOCAINE 2-2-14 % EX AERO
INHALATION_SPRAY | CUTANEOUS | Status: DC | PRN
  Administered 2016-09-15: 2 via TOPICAL

## 2016-09-15 MED ORDER — OXYCODONE HCL 5 MG/5ML PO SOLN: 5.0000 mg | Freq: Once | ORAL | Status: DC | PRN

## 2016-09-15 MED ORDER — FENTANYL CITRATE (PF) 100 MCG/2ML IJ SOLN
INTRAMUSCULAR | Status: AC
  Filled 2016-09-15: qty 2

## 2016-09-15 MED ORDER — SODIUM CHLORIDE 0.9 % IV SOLN
INTRAVENOUS | Status: DC
  Administered 2016-09-15: 500 mL via INTRAVENOUS

## 2016-09-15 MED ORDER — ISOPROTERENOL HCL 0.2 MG/ML IJ SOLN
INTRAMUSCULAR | Status: AC
  Filled 2016-09-15: qty 5

## 2016-09-15 MED ORDER — ACETAMINOPHEN 325 MG PO TABS
650.0000 mg | ORAL_TABLET | ORAL | Status: DC | PRN
  Administered 2016-09-15: 650 mg via ORAL
  Filled 2016-09-15 (×2): qty 2

## 2016-09-15 MED ORDER — DIPHENHYDRAMINE HCL 50 MG/ML IJ SOLN
INTRAMUSCULAR | Status: AC
  Filled 2016-09-15: qty 1

## 2016-09-15 SURGICAL SUPPLY — 22 items
BAG SNAP BAND KOVER 36X36 (MISCELLANEOUS) ×2 IMPLANT
BLANKET WARM UNDERBOD FULL ACC (MISCELLANEOUS) ×2 IMPLANT
CATH DIAG EXPO 6F VENT PIG (CATHETERS) ×2 IMPLANT
CATH NAVISTAR SMARTTOUCH DF (ABLATOR) ×2 IMPLANT
CATH SOUNDSTAR 3D IMAGING (CATHETERS) ×2 IMPLANT
CATH VARIABLE LASSO NAV 2515 (CATHETERS) ×2 IMPLANT
CATH WEBSTER BI DIR CS D-F CRV (CATHETERS) ×2 IMPLANT
COVER SWIFTLINK CONNECTOR (BAG) ×2 IMPLANT
HOVERMATT SINGLE USE (MISCELLANEOUS) ×2 IMPLANT
NEEDLE TRANSEP BRK 71CM 407200 (NEEDLE) ×2 IMPLANT
PACK EP LATEX FREE (CUSTOM PROCEDURE TRAY) ×1
PACK EP LF (CUSTOM PROCEDURE TRAY) ×1 IMPLANT
PAD DEFIB LIFELINK (PAD) ×2 IMPLANT
PATCH CARTO3 (PAD) ×2 IMPLANT
SHEATH AVANTI 11F 11CM (SHEATH) ×2 IMPLANT
SHEATH PINNACLE 7F 10CM (SHEATH) ×4 IMPLANT
SHEATH PINNACLE 9F 10CM (SHEATH) ×2 IMPLANT
SHEATH SWARTZ TS SL2 63CM 8.5F (SHEATH) ×2 IMPLANT
SHIELD RADPAD SCOOP 12X17 (MISCELLANEOUS) ×2 IMPLANT
SYR MEDRAD MARK V 150ML (SYRINGE) ×2 IMPLANT
TUBING CONTRAST HIGH PRESS 48 (TUBING) ×2 IMPLANT
TUBING SMART ABLATE COOLFLOW (TUBING) ×2 IMPLANT

## 2016-09-15 NOTE — H&P (Signed)
Susa SimmondsDanny R Taite  09/01/2016 9:00 AM  ATRIAL FIB OFFICE VISIT  MRN:  [id number]  Description: Male DOB: 20-Aug-1951 Provider: Newman Niponna C Carroll, NP Department: [facility]  Vitals   BP  136/80 (BP Location: Left Arm, Patient Position: Sitting, Cuff Size: Large)   Pulse    53   Ht  5\' 6"  (1.676 m)   Wt  253 lb 6.4 oz (114.9 kg)   BMI  40.90 kg/m    Progress Notes   Newman Niponna C Carroll, NP at 09/01/2016 2:06 PM   Status: Signed        Primary Care Physician: No primary care provider on file. Referring Physician: Dr. Clide DeutscherBranch   Raymont R Wadleigh is a 65 y.o. male with a h/o CAD status post 2 vessel [facility] with low risk nuclear stress test in 2016, paroxysmal atrial fibrillation, obstructive sleep apnea fully compliant with CPAP, HTN, HLD, sinus bradycardia, obesity, spinal stenosis, [facility] with chest pain and tachypalpitations. He was found to be back in atrial fib with RVR and converted to NSR in the ED. His EKG was without ischemic changes. He was admitted for initiation of antiarrhythmic therapy with sotalol at 80mg  BID. His Toprol was stopped to allow for this. His diltiazem dose was decreased for sinus bradycardia. TSH was normal. Mg was 2.1. Potassium ranged 3.7-4.1.  Eliquis was continued. Not on ASA due to need for Eliquis and no active ACS. His celexa was switched to sertraline due to QTc prolongation with the former. Morning of d/c, he was maintaining NSR with QTc 500 msec.  He is in the afib clinic for f/u. He has had 3 episodes of afib since d/c. The shortest was 5 mins and the longest was almost 3 hours. Usually will return to SR  with a as needed metoprolol. He does have CPAP that he uses religiously. Occasional beer. Exercises 3x a week on a regular basis at the gym but struggles to lose weight.  Today, he denies symptoms of palpitations, chest pain, shortness of breath, orthopnea, PND, lower extremity edema, dizziness, presyncope,  syncope, or neurologic sequela. The patient is tolerating medications without difficulties and is otherwise without complaint today.       Past Medical History:  Diagnosis Date  . Anxiety   . Chronic chest pain   . Chronic sinus bradycardia   . Coronary artery disease    [facility], [facility]  . DJD (degenerative joint disease)   . Edema   . HLD (hyperlipidemia)   . Hypertension   . MI, old 2004  . OA (osteoarthritis)   . Obesity   . PAF (paroxysmal atrial fibrillation) (HCC)    a. started on sotalol 08/2016.  Marland Kitchen. Palpitations    Reported history of PVCs  . QT prolongation   . Sleep apnea    on CPAP  . Spinal stenosis         Past Surgical History:  Procedure Laterality Date  . BACK SURGERY  2011  . CARPAL TUNNEL RELEASE     bilateral  . CORONARY ARTERY BYPASS GRAFT     2004  . fistula surgery    . hemorrhoidectomy    . LUMBAR LAMINECTOMY    . right foot fracture    . TRANSTHORACIC ECHOCARDIOGRAM  08/25/2010   EF 55-60%          Current Outpatient Prescriptions  Medication Sig Dispense Refill  . acetaminophen (TYLENOL) 650 MG CR tablet Take 650 mg  by mouth every 8 (eight) hours as needed for pain.     Marland Kitchen apixaban (ELIQUIS) 5 MG TABS tablet Take 1 tablet (5 mg total) by mouth 2 (two) times daily. 56 tablet 0  . atorvastatin (LIPITOR) 20 MG tablet Take 1 tablet (20 mg total) by mouth daily. 90 tablet 3  . chlorthalidone (HYGROTON) 25 MG tablet Take 1.5 tablets (37.5 mg total) by mouth daily. 45 tablet 3  . Coenzyme Q10 (CO Q 10) 100 MG CAPS Take 400 mg by mouth daily.    Marland Kitchen diltiazem (CARDIZEM CD) 120 MG 24 hr capsule Take 1 capsule (120 mg total) by mouth daily. 30 capsule 6  . fish oil-omega-3 fatty acids 1000 MG capsule Take 3 g by mouth 2 (two) times daily.     . irbesartan (AVAPRO) 300 MG tablet Take 1 tablet (300 mg total) by mouth at bedtime. 90 tablet 3  . metoprolol tartrate (LOPRESSOR) 25 MG tablet Take 1 tablet (25  mg total) by mouth every 8 (eight) hours as needed. 180 tablet 3  . Multiple Vitamin (MULTIVITAMIN) tablet Take 1 tablet by mouth daily.    Marland Kitchen omeprazole (PRILOSEC) 20 MG capsule Take 20 mg by mouth every other day.     . sertraline (ZOLOFT) 25 MG tablet Take 1 tablet (25 mg total) by mouth daily. 30 tablet 0  . sotalol (BETAPACE) 80 MG tablet Take 1 tablet (80 mg total) by mouth every 12 (twelve) hours. 60 tablet 3   No current facility-administered medications for this encounter.          Allergies  Allergen Reactions  . Nsaids Other (See Comments)    On blood thinner  . Vancomycin Other (See Comments)    Was informed after heart surgery ? Breathing problem/patient unsure    Social History        Social History  . Marital status: Married    Spouse name: [name]  . Number of children: 0  . Years of education: [name]       Occupational History  . electrician [employer]       Social History Main Topics  . Smoking status: Never Smoker  . Smokeless tobacco: Never Used  . Alcohol use No  . Drug use: No  . Sexual activity: Yes       Other Topics Concern  . Not on file      Social History Narrative  . No narrative on file         Family History  Problem Relation Age of Onset  . Breast cancer Mother   . Hypertension Mother   . Coronary artery disease Father   . Stroke Father   . Hypertension Father     ROS- All systems are reviewed and negative except as per the HPI above  Physical Exam:    Vitals:   09/01/16 0919  BP: 136/80  Pulse: (!) 53  Weight: 253 lb 6.4 oz (114.9 kg)  Height: 5\' 6"  (1.676 m)    GEN- The patient is well appearing, alert and oriented x 3 today.   Head- normocephalic, atraumatic Eyes-  Sclera clear, conjunctiva pink Ears- hearing intact Oropharynx- clear Neck- supple, no JVP Lymph- no cervical lymphadenopathy Lungs- Clear to ausculation bilaterally, normal work of breathing Heart-  Regular rate and rhythm, no murmurs, rubs or gallops, PMI not laterally displaced GI- soft, NT, ND, + BS Extremities- no clubbing, cyanosis, or edema MS- no significant deformity or atrophy Skin- no rash or lesion  Psych- euthymic mood, full affect Neuro- strength and sensation are intact  EKG-Sinus brady at 53 bpm, IRBBB, Pr int 168 ms, qrs int 102 ms, qtc 491 ms(stable) Epic records reviewed  Assessment and Plan: 1. Paroxysmal afib Recently loaded on sotalol with a few breakthrough episodes but not that rapid or sustained Continue sotalol, doubt could increase dose due to qtc near 500 ms.  Continue to use as needed metoprolol for breakthrough episodes Discussed next step if afib burden increases, he is not interested in ablation at this time  2. Lifestyle issues Congratulated on regular gym schedule Try to lose 5-10% of current body weight Decrease beer to no more than 2 a week Continue regular use of cpap  F/u with [name] 2/28 afib clinic as needed     For TEE prior to atrial fibrillation ablation. Olga Millers, MD

## 2016-09-15 NOTE — Interval H&P Note (Signed)
History and Physical Interval Note:  09/15/2016 10:40 AM  [name] [name]  has presented today for surgery, with the diagnosis of afib  The various methods of treatment have been discussed with the patient and family. After consideration of risks, benefits and other options for treatment, the patient has consented to  Procedure(s): Atrial Fibrillation Ablation ([name]) as a surgical intervention .  The patient's history has been reviewed, patient examined, no change in status, stable for surgery.  I have reviewed the patient's chart and labs.  Questions were answered to the patient's satisfaction.     Hillis Range

## 2016-09-15 NOTE — Discharge Instructions (Signed)
You have an appointment set up with the Atrial Fibrillation Clinic.  Multiple studies have shown that being followed by a dedicated atrial fibrillation clinic in addition to the standard care you receive from your other physicians improves health. We believe that enrollment in the atrial fibrillation clinic will allow us to better care for you.  ° °The phone number to the Atrial Fibrillation Clinic is [phone]. The clinic is staffed Monday through Friday from 8:30am to 5pm. ° °Parking Directions: The clinic is located in the Heart and Vascular Building connected to [facility]. °1)From [address] turn on to [address] and go to the 3rd entrance  (Heart and Vascular entrance) on the right. °2)Look to the right for Heart &Vascular Parking Garage. °3)A code for the entrance is required please call the clinic to receive this.   °4)Take the elevators to the 1st floor. Registration is in the room with the glass walls at the end of the hallway. ° °If you have any trouble parking or locating the clinic, please don’t hesitate to call [phone]. ° °No driving for 4 days. No lifting over 5 lbs for 1 week. No sexual activity for 1 week. You may return to work in 1 week. Keep procedure site clean & dry. If you notice increased pain, swelling, bleeding or pus, call/return!  You may shower, but no soaking baths/hot tubs/pools for 1 week.  ° °

## 2016-09-15 NOTE — Progress Notes (Addendum)
Site area:RFV x 3  Site Prior to Removal:  Level 0 Pressure Applied For: 30 min Manual:   yes Patient Status During Pull:   Post Pull Site:  Level 0 Post Pull Instructions Given: yes  Post Pull Pulses Present:  Dressing Applied:  tegaderm Bedrest begins @ 1550 till 2150 Comments:

## 2016-09-15 NOTE — Progress Notes (Signed)
  Echocardiogram Echocardiogram Transesophageal has been performed.  Leta Jungling M 09/15/2016, 9:26 AM

## 2016-09-15 NOTE — Transfer of Care (Signed)
Immediate Anesthesia Transfer of Care Note  Patient: [name] [name]  Procedure(s) Performed: Procedure(s): Atrial Fibrillation Ablation ([name])  Patient Location: Cath Lab  Anesthesia Type:General  Level of Consciousness: awake, alert  and patient cooperative  Airway & Oxygen Therapy: Patient Spontanous Breathing  Post-op Assessment: Report given to RN and Post -op Vital signs reviewed and stable  Post vital signs: Reviewed and stable  Last Vitals:  Vitals:   09/15/16 1445 09/15/16 1450  BP: (!) 138/53 (!) 136/56  Pulse: 70 69  Resp: 20 17  Temp: 36.2 C     Last Pain:  Vitals:   09/15/16 1447  TempSrc:   PainSc: 6          Complications: No apparent anesthesia complications

## 2016-09-15 NOTE — CV Procedure (Signed)
See full TEE report in [facility] Pt sedated with versed 6 mg and fentanyl 50 micrograms IV Normal LV function No LAA thrombus Full report to follow Olga Millers, MD

## 2016-09-15 NOTE — Anesthesia Preprocedure Evaluation (Addendum)
Anesthesia Evaluation  Patient identified by MRN, date of birth, ID band Patient awake    Reviewed: Allergy & Precautions, NPO status , Patient's Chart, lab work & pertinent test results, reviewed documented beta blocker date and time   Airway Mallampati: II  TM Distance: >3 FB Neck ROM: Full    Dental no notable dental hx.    Pulmonary sleep apnea and Continuous Positive Airway Pressure Ventilation ,    Pulmonary exam normal breath sounds clear to auscultation       Cardiovascular hypertension, Pt. on medications and Pt. on home beta blockers + CAD, + Past MI and +CHF  Normal cardiovascular exam+ dysrhythmias  Rhythm:Regular Rate:Normal     Neuro/Psych PSYCHIATRIC DISORDERS Anxiety Depression negative neurological ROS     GI/Hepatic Neg liver ROS, GERD  ,  Endo/Other  Morbid obesity  Renal/GU negative Renal ROS     Musculoskeletal  (+) Arthritis ,   Abdominal   Peds  Hematology negative hematology ROS (+)   Anesthesia Other Findings   Reproductive/Obstetrics negative OB ROS                           Anesthesia Physical Anesthesia Plan  ASA: III  Anesthesia Plan:    Post-op Pain Management:    Induction: Intravenous  Airway Management Planned:   Additional Equipment:   Intra-op Plan:   Post-operative Plan:   Informed Consent: I have reviewed the patients History and Physical, chart, labs and discussed the procedure including the risks, benefits and alternatives for the proposed anesthesia with the patient or authorized representative who has indicated his/her understanding and acceptance.   Dental advisory given  Plan Discussed with: CRNA  Anesthesia Plan Comments:        Anesthesia Quick Evaluation

## 2016-09-15 NOTE — Anesthesia Procedure Notes (Signed)
Procedure Name: Intubation Date/Time: 09/15/2016 11:34 AM Performed by: Rosiland Oz Pre-anesthesia Checklist: Patient identified, Emergency Drugs available, Suction available, Patient being monitored and Timeout performed Patient Re-evaluated:Patient Re-evaluated prior to inductionOxygen Delivery Method: Circle system utilized Preoxygenation: Pre-oxygenation with 100% oxygen Intubation Type: IV induction Ventilation: Mask ventilation without difficulty Laryngoscope Size: Miller and 3 Grade View: Grade I Tube size: 7.5 mm Number of attempts: 1 Airway Equipment and Method: Stylet Placement Confirmation: ETT inserted through vocal cords under direct vision,  positive ETCO2 and breath sounds checked- equal and bilateral Secured at: 23 cm Tube secured with: Tape Dental Injury: Teeth and Oropharynx as per pre-operative assessment

## 2016-09-15 NOTE — H&P (Signed)
ELECTROPHYSIOLOGY HISTORY AND PHYSICAL    Patient ID: [name] [name] MRN: [id number], DOB/AGE: Jul 18, 1951 65 y.o.  Admit date: 09/15/2016 Date of Consult: 09/15/2016  Primary Physician: No PCP Per Patient Primary Cardiologist: Branch Electrophysiologist: Lashaun Poch   CC:  Here for AF ablation   HPI:  [name] [name] is a 65 y.o. male with a past medical history significant for CAD s/p CABG, OSA on CPAP, hypertension, hyperlipidemia, and paroxysmal atrial fibrillation.  He presented to the hospital recently with recurrent symptomatic atrial fibrillation.  He is symptomatic with fatigue, palpitations, and shortness of breath.  He was seen by Dr Johney Frame and treatment options were discussed. He was started on amiodarone during that hospitalization with plans for elective AF ablation.   Since discharge, he reports one episode of AF that lasted a few minutes but otherwise has been maintaining SR.  He has not had chest pain, shortness of breath, fevers, chills, nausea or vomiting.  He reports compliance with medications including Eliquis.   Past Medical History:  Diagnosis Date  . Anxiety   . Chronic chest pain   . Chronic sinus bradycardia   . Coronary artery disease    2v CABG, 2004 [facility])  . DJD (degenerative joint disease)   . Edema   . HLD (hyperlipidemia)   . Hypertension   . MI, old 2004  . OA (osteoarthritis)   . Obesity   . PAF (paroxysmal atrial fibrillation) (HCC)    a. started on sotalol 08/2016.  Marland Kitchen Palpitations    Reported history of PVCs  . QT prolongation   . Sleep apnea    on CPAP  . Spinal stenosis   . Typical atrial flutter [facility])      Surgical History:  Past Surgical History:  Procedure Laterality Date  . BACK SURGERY  2011  . CARPAL TUNNEL RELEASE     bilateral  . CORONARY ARTERY BYPASS GRAFT     2004  . fistula surgery    . hemorrhoidectomy    . LUMBAR LAMINECTOMY    . right foot fracture    . TRANSTHORACIC ECHOCARDIOGRAM  08/25/2010   EF 55-60%     Prescriptions Prior to Admission  Medication Sig Dispense Refill Last Dose  . acetaminophen (TYLENOL) 650 MG CR tablet Take 650 mg by mouth every 8 (eight) hours as needed for pain.    Past Week at Unknown time  . amiodarone (PACERONE) 200 MG tablet Take 2 tablets (400 mg total) by mouth 3 (three) times daily. 400 mg tid x 1 wk, 400 mg bid x 1 wk, 200 mg bid x 2 wks, then 200 mg qd. 85 tablet 6 09/15/2016 at 0630  . apixaban (ELIQUIS) 5 MG TABS tablet Take 1 tablet (5 mg total) by mouth 2 (two) times daily. 56 tablet 0 09/14/2016 at Unknown time  . atorvastatin (LIPITOR) 20 MG tablet Take 1 tablet (20 mg total) by mouth daily. (Patient taking differently: Take 20 mg by mouth daily at 6 PM. ) 90 tablet 3 09/14/2016  . chlorthalidone (HYGROTON) 25 MG tablet Take 1.5 tablets (37.5 mg total) by mouth daily. 45 tablet 3 09/15/2016 at 0630  . Coenzyme Q10 (CO Q 10) 100 MG CAPS Take 100 mg by mouth daily.    09/15/2016 at 0630  . diltiazem (CARDIZEM CD) 360 MG 24 hr capsule Take 1 capsule (360 mg total) by mouth daily. 30 capsule 11 09/15/2016 at 0630  . fish oil-omega-3 fatty acids 1000 MG capsule Take 1 g by  mouth 2 (two) times daily.    09/15/2016 at 0630  . irbesartan (AVAPRO) 300 MG tablet Take 1 tablet (300 mg total) by mouth at bedtime. 90 tablet 3 09/14/2016 at Unknown time  . Multiple Vitamin (MULTIVITAMIN) tablet Take 1 tablet by mouth daily.   09/15/2016 at 0630  . omeprazole (PRILOSEC) 20 MG capsule Take 20 mg by mouth every other day.    09/14/2016 at Unknown time  . sertraline (ZOLOFT) 25 MG tablet Take 1 tablet (25 mg total) by mouth daily. 30 tablet 1 09/15/2016 at 0630    Inpatient Medications:   Allergies:  Allergies  Allergen Reactions  . Nsaids Other (See Comments)    On blood thinner  . Vancomycin Other (See Comments)    Was informed after heart surgery ? Breathing problem/patient unsure    Social History   Social History  . Marital status: Married    Spouse name:  [name]  . Number of children: 0  . 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 Smoking status: Never Smoker  . Smokeless tobacco: Never Used  . Alcohol use No  . Drug use: No  . Sexual activity: Yes   Other Topics Concern  . Not on file   Social History Narrative  . No narrative on file     Family History  Problem Relation Age of Onset  . Breast cancer Mother   . Hypertension Mother   . Coronary artery disease Father   . Stroke Father   . Hypertension Father      Review of Systems: All other systems reviewed and are otherwise negative except as noted above.  Physical Exam: Vitals:   09/15/16 0802  BP: (!) 176/75  Pulse: 63  Resp: 15  Temp: 97.8 F (36.6 C)  TempSrc: Oral  SpO2: 96%  Weight: 239 lb (108.4 kg)  Height: 5\' 5"  (1.651 m)    GEN- The patient is obese appearing, alert and oriented x 3 today.   HEENT: normocephalic, atraumatic; sclera clear, conjunctiva pink; hearing intact; oropharynx clear; neck supple  Lungs- Clear to ausculation bilaterally, normal work of breathing.  No wheezes, rales, rhonchi Heart- Regular rate and rhythm  GI- soft, non-tender, non-distended, bowel sounds present  Extremities- no clubbing, cyanosis, or edema  MS- no significant deformity or atrophy Skin- warm and dry, no rash or lesion Psych- euthymic mood, full affect Neuro- strength and sensation are intact  Labs:   Lab Results  Component Value Date   WBC 11.9 (H) 09/12/2016   HGB 14.0 09/12/2016   HCT 41.3 09/12/2016   MCV 84.5 09/12/2016   PLT 296 09/12/2016    Recent Labs Lab 09/08/16 2118 09/12/16 0413  NA 138 138  K 4.3 3.8  CL 106 101  CO2 23 26  BUN 15 20  CREATININE 1.13 1.05  CALCIUM 9.8 9.4  PROT 7.9  --   BILITOT 0.5  --   ALKPHOS 74  --   ALT 27  --   AST 27  --   GLUCOSE 133* 120*      Radiology/Studies: Dg Chest 2 View Result Date: 09/08/2016 CLINICAL DATA:   Chest pain and dyspnea today. History CABG in atrial fibrillation. EXAM: CHEST  2 VIEW COMPARISON:  08/19/2016 FINDINGS: The heart is enlarged. The patient is status post median sternotomy. Mild interstitial prominence without overt pulmonary edema, pneumonic consolidation, effusion or pneumothorax. Minimal atelectasis at the lung  bases left greater right. No acute osseous abnormality. IMPRESSION: Bibasilar subsegmental atelectasis with stable cardiomegaly. Aortic atherosclerosis.  No significant change from prior. Electronically Signed   By: Tollie Eth M.D.   On: 09/08/2016 22:22   Assessment/Plan: 1. Paroxysmal symptomatic atrial fibrillation The patient has symptomatic paroxysmal atrial fibrillation.  He has failed medical therapy with Sotalol and is now on amiodarone. Risk, benefits, and alternatives to EP study and radiofrequency ablation for afib were also discussed in detail today. These risks include but are not limited to stroke, bleeding, vascular damage, tamponade, perforation, damage to the esophagus, lungs, and other structures, pulmonary vein stenosis, worsening renal function, and death. The patient understands these risk and wishes to proceed. Will plan TEE followed by EPS/ablation later today.  He reports compliance with Eliquis without any missed doses Continue long term anticoagulation for CHADS2VASC of 2  2.  Obesity Body mass index is 39.77 kg/m. Weight loss encouraged  3.  OSA Compliant with CPAP  4.  CAD No recent ischemic symptoms Continue medical therapy   Gypsy Balsam, NP 09/15/2016 8:31 AM    I have seen, examined the patient, and reviewed the above assessment and plan.  On exam, RRR.   Changes to above are made where necessary.    He has afib and typical appearing atrial flutter.  Risk, benefits, and alternatives to EP study and radiofrequency ablation were also discussed in detail today. These risks include but are not limited to stroke, bleeding, vascular  damage, tamponade, perforation, damage to the esophagus, lungs, and other structures, pulmonary vein stenosis, worsening renal function, and death. The patient understands these risk and wishes to proceed at this time.  He reports compliance with anticoagulation without interruption.   TEE reviewed.  Co Sign: Hillis Range, MD 09/15/2016 10:39 AM

## 2016-09-16 ENCOUNTER — Encounter (HOSPITAL_COMMUNITY): Payer: Self-pay | Admitting: Internal Medicine

## 2016-09-16 DIAGNOSIS — I480 Paroxysmal atrial fibrillation: Secondary | ICD-10-CM

## 2016-09-16 DIAGNOSIS — I10 Essential (primary) hypertension: Secondary | ICD-10-CM | POA: Diagnosis not present

## 2016-09-16 DIAGNOSIS — Z881 Allergy status to other antibiotic agents status: Secondary | ICD-10-CM | POA: Diagnosis not present

## 2016-09-16 DIAGNOSIS — G4733 Obstructive sleep apnea (adult) (pediatric): Secondary | ICD-10-CM | POA: Diagnosis not present

## 2016-09-16 DIAGNOSIS — I2510 Atherosclerotic heart disease of native coronary artery without angina pectoris: Secondary | ICD-10-CM | POA: Diagnosis not present

## 2016-09-16 DIAGNOSIS — M1990 Unspecified osteoarthritis, unspecified site: Secondary | ICD-10-CM | POA: Diagnosis not present

## 2016-09-16 DIAGNOSIS — Z951 Presence of aortocoronary bypass graft: Secondary | ICD-10-CM | POA: Diagnosis not present

## 2016-09-16 DIAGNOSIS — E785 Hyperlipidemia, unspecified: Secondary | ICD-10-CM | POA: Diagnosis not present

## 2016-09-16 DIAGNOSIS — I483 Typical atrial flutter: Secondary | ICD-10-CM | POA: Diagnosis not present

## 2016-09-16 DIAGNOSIS — I252 Old myocardial infarction: Secondary | ICD-10-CM | POA: Diagnosis not present

## 2016-09-16 LAB — BASIC METABOLIC PANEL
Anion gap: 9 (ref 5–15)
BUN: 12 mg/dL (ref 6–20)
CO2: 24 mmol/L (ref 22–32)
Calcium: 8.9 mg/dL (ref 8.9–10.3)
Chloride: 103 mmol/L (ref 101–111)
Creatinine, Ser: 0.92 mg/dL (ref 0.61–1.24)
GFR calc Af Amer: >60 mL/min (ref >60)
GLUCOSE: 129 mg/dL — AB (ref 65–99)
POTASSIUM: 3.7 mmol/L (ref 3.5–5.1)
Sodium: 136 mmol/L (ref 135–145)

## 2016-09-16 MED ORDER — AMIODARONE HCL 200 MG PO TABS: 200.0000 mg | ORAL_TABLET | Freq: Two times a day (BID) | ORAL | 6 refills | Status: DC

## 2016-09-16 NOTE — Discharge Summary (Signed)
ELECTROPHYSIOLOGY PROCEDURE DISCHARGE SUMMARY    Patient ID: [name] [name],  MRN: [id number], DOB/AGE: 05-Mar-1951 65 y.o.  Admit date: 09/15/2016 Discharge date: 09/16/2016  Primary Care Physician: No PCP Per Patient Primary Cardiologist: Branch Electrophysiologist: Hillis Range, MD  Primary Discharge Diagnosis:  Paroxysmal atrial fibrillation and atrial flutter status post ablation this admission   Secondary Discharge Diagnosis:  1.  CAD s/p CABG 2.  OSA on CPAP 3.  Hypertension 4.  Hyperlipidemia  Procedures This Admission:  1.  Electrophysiology study and radiofrequency catheter ablation on 09/15/16 by Dr Hillis Range.  This study demonstrated sinus rhythm upon presentation; rotational Angiography reveals a moderate sized left atrium with four separate pulmonary veins without evidence of pulmonary vein stenosis; successful electrical isolation and anatomical encircling of all four pulmonary veins with radiofrequency current; cavo-tricuspid isthmus ablation was performed with complete bidirectional isthmus block achieved; no inducible arrhythmias following ablation both on and off of Isuprel; no early apparent complications..    Brief HPI: [name] [name] is a 65 y.o. male with a history of paroxysmal atrial fibrillation.  They have failed medical therapy with sotalol. Risks, benefits, and alternatives to catheter ablation of atrial fibrillation were reviewed with the patient who wished to proceed.  The patient underwent TEE prior to the procedure which demonstrated normal LV function and no LAA thrombus.    Hospital Course:  The patient was admitted and underwent EPS/RFCA of atrial fibrillation with details as outlined above.  They were monitored on telemetry overnight which demonstrated sinus rhythm.  Groin was without complication on the day of discharge.  The patient was examined and considered to be stable for discharge.  Wound care and restrictions were reviewed with the  patient.  The patient will be seen back by Rudi Coco, NP in 4 weeks and Dr Johney Frame in 12 weeks for post ablation follow up.   This patients CHA2DS2-VASc Score and unadjusted Ischemic Stroke Rate (% per year) is equal to 2.2 % stroke rate/year from a score of 2 Above score calculated as 1 point each if present [CHF, HTN, DM, Vascular=MI/PAD/Aortic Plaque, Age if 65-74, or Male] Above score calculated as 2 points each if present [Age > 75, or Stroke/TIA/TE]   Physical Exam: Vitals:   09/16/16 0000 09/16/16 0005 09/16/16 0400 09/16/16 0727  BP:  119/60 121/63 137/77  Pulse:  65 (!) 56 66  Resp:  18 20 12   Temp: 97.6 F (36.4 C)  97.8 F (36.6 C) 97.7 F (36.5 C)  TempSrc: Oral  Oral Oral  SpO2:  95% 94% 95%  Weight:      Height:        GEN- The patient is well appearing, alert and oriented x 3 today.   HEENT: normocephalic, atraumatic; sclera clear, conjunctiva pink; hearing intact; oropharynx clear; neck supple  Lungs- Clear to ausculation bilaterally, normal work of breathing.  No wheezes, rales, rhonchi Heart- Regular rate and rhythm, no murmurs, rubs or gallops  GI- soft, non-tender, non-distended, bowel sounds present  Extremities- no clubbing, cyanosis, or edema; DP/PT/radial pulses 2+ bilaterally, groin without hematoma/bruit MS- no significant deformity or atrophy Skin- warm and dry, no rash or lesion Psych- euthymic mood, full affect Neuro- strength and sensation are intact   Labs:   Lab Results  Component Value Date   WBC 11.9 (H) 09/12/2016   HGB 14.0 09/12/2016   HCT 41.3 09/12/2016   MCV 84.5 09/12/2016   PLT 296 09/12/2016     Recent Labs Lab  09/16/16 0300  NA 136  K 3.7  CL 103  CO2 24  BUN 12  CREATININE 0.92  CALCIUM 8.9  GLUCOSE 129*     Discharge Medications:    Medication List    TAKE these medications   acetaminophen 650 MG CR tablet Commonly known as:  TYLENOL Take 650 mg by mouth every 8 (eight) hours as needed for pain.     amiodarone 200 MG tablet Commonly known as:  PACERONE Take 1 tablet (200 mg total) by mouth 2 (two) times daily. Take 200mg  twice daily for 2 weeks then 200mg  daily What changed:  how much to take  when to take this  additional instructions   apixaban 5 MG Tabs tablet Commonly known as:  ELIQUIS Take 1 tablet (5 mg total) by mouth 2 (two) times daily.   atorvastatin 20 MG tablet Commonly known as:  LIPITOR Take 1 tablet (20 mg total) by mouth daily. What changed:  when to take this   chlorthalidone 25 MG tablet Commonly known as:  HYGROTON Take 1.5 tablets (37.5 mg total) by mouth daily.   Co Q 10 100 MG Caps Take 100 mg by mouth daily.   diltiazem 360 MG 24 hr capsule Commonly known as:  CARDIZEM CD Take 1 capsule (360 mg total) by mouth daily.   fish oil-omega-3 fatty acids 1000 MG capsule Take 1 g by mouth 2 (two) times daily.   irbesartan 300 MG tablet Commonly known as:  AVAPRO Take 1 tablet (300 mg total) by mouth at bedtime.   multivitamin tablet Take 1 tablet by mouth daily.   omeprazole 20 MG capsule Commonly known as:  PRILOSEC Take 20 mg by mouth every other day.   sertraline 25 MG tablet Commonly known as:  ZOLOFT Take 1 tablet (25 mg total) by mouth daily.       Disposition:  Discharge Instructions    Diet - low sodium heart healthy    Complete by:  As directed    Increase activity slowly    Complete by:  As directed      Follow-up Information    [facility] Follow up on 10/13/2016.   Specialty:  Cardiology Why:  at 11:30AM  Contact information: [address] [id number] [place] [id number] [phone]       Hillis RangeJames Lateria Alderman, MD Follow up on 12/21/2016.   Specialty:  Cardiology Why:  at 10:45AM  Contact information: [address] [place] [address] [phone]           Duration of Discharge Encounter: Greater than 30 minutes including physician  time.  Signed, Gypsy BalsamAmber Seiler, NP 09/16/2016 8:57 AM   I have seen, examined the patient, and reviewed the above assessment and plan.  On exam, RRR.  Changes to above are made where necessary.    Co Sign: Hillis RangeJames Caisen Mangas, MD 09/16/2016 9:16 AM

## 2016-09-16 NOTE — Progress Notes (Signed)
Discharge instructions reviewed with patient and wife at bedside, all questions answered. Pt removed from monitor and PIV removed. Pt alert and oriented at time of discharge. Pt discharged to home with wife.

## 2016-09-17 NOTE — Anesthesia Postprocedure Evaluation (Signed)
Anesthesia Post Note  Patient: [name] [name]  Procedure(s) Performed: Procedure(s) (LRB): Atrial Fibrillation Ablation ([name])  Patient location during evaluation: PACU Anesthesia Type: General Level of consciousness: sedated and patient cooperative Pain management: pain level controlled Vital Signs Assessment: post-procedure vital signs reviewed and stable Respiratory status: spontaneous breathing Cardiovascular status: stable Anesthetic complications: no     Last Vitals:  Vitals:   09/16/16 0400 09/16/16 0727  BP: 121/63 137/77  Pulse: (!) 56 66  Resp: 20 12  Temp: 36.6 C 36.5 C    Last Pain:  Vitals:   09/16/16 0727  TempSrc: Oral  PainSc:    Pain Goal:                 Lewie Loron

## 2016-09-21 ENCOUNTER — Ambulatory Visit (HOSPITAL_COMMUNITY): Payer: Medicare HMO | Admitting: Nurse Practitioner

## 2016-09-21 ENCOUNTER — Ambulatory Visit (HOSPITAL_COMMUNITY)
Admission: RE | Admit: 2016-09-21 | Discharge: 2016-09-21 | Disposition: A | Discharge status: Home / Self Care | Payer: Medicare HMO | Source: Ambulatory Visit | Attending: Nurse Practitioner | Admitting: Nurse Practitioner

## 2016-09-21 ENCOUNTER — Encounter (HOSPITAL_COMMUNITY): Payer: Self-pay | Admitting: Nurse Practitioner

## 2016-09-21 VITALS — BP 146/72 | HR 144 | Ht 65.0 in | Wt 251.8 lb

## 2016-09-21 DIAGNOSIS — Z6841 Body Mass Index (BMI) 40.0 and over, adult: Secondary | ICD-10-CM | POA: Insufficient documentation

## 2016-09-21 DIAGNOSIS — Z823 Family history of stroke: Secondary | ICD-10-CM | POA: Diagnosis not present

## 2016-09-21 DIAGNOSIS — E669 Obesity, unspecified: Secondary | ICD-10-CM | POA: Insufficient documentation

## 2016-09-21 DIAGNOSIS — I4891 Unspecified atrial fibrillation: Secondary | ICD-10-CM | POA: Diagnosis present

## 2016-09-21 DIAGNOSIS — F419 Anxiety disorder, unspecified: Secondary | ICD-10-CM | POA: Insufficient documentation

## 2016-09-21 DIAGNOSIS — I252 Old myocardial infarction: Secondary | ICD-10-CM | POA: Diagnosis not present

## 2016-09-21 DIAGNOSIS — E785 Hyperlipidemia, unspecified: Secondary | ICD-10-CM | POA: Diagnosis not present

## 2016-09-21 DIAGNOSIS — Z881 Allergy status to other antibiotic agents status: Secondary | ICD-10-CM | POA: Diagnosis not present

## 2016-09-21 DIAGNOSIS — I480 Paroxysmal atrial fibrillation: Secondary | ICD-10-CM | POA: Diagnosis not present

## 2016-09-21 DIAGNOSIS — Z8249 Family history of ischemic heart disease and other diseases of the circulatory system: Secondary | ICD-10-CM | POA: Diagnosis not present

## 2016-09-21 DIAGNOSIS — G4733 Obstructive sleep apnea (adult) (pediatric): Secondary | ICD-10-CM | POA: Insufficient documentation

## 2016-09-21 DIAGNOSIS — Z7901 Long term (current) use of anticoagulants: Secondary | ICD-10-CM | POA: Diagnosis not present

## 2016-09-21 DIAGNOSIS — Z9889 Other specified postprocedural states: Secondary | ICD-10-CM | POA: Diagnosis not present

## 2016-09-21 DIAGNOSIS — M1990 Unspecified osteoarthritis, unspecified site: Secondary | ICD-10-CM | POA: Insufficient documentation

## 2016-09-21 DIAGNOSIS — Z888 Allergy status to other drugs, medicaments and biological substances status: Secondary | ICD-10-CM | POA: Diagnosis not present

## 2016-09-21 DIAGNOSIS — M4800 Spinal stenosis, site unspecified: Secondary | ICD-10-CM | POA: Diagnosis not present

## 2016-09-21 DIAGNOSIS — Z79899 Other long term (current) drug therapy: Secondary | ICD-10-CM | POA: Diagnosis not present

## 2016-09-21 DIAGNOSIS — I4892 Unspecified atrial flutter: Secondary | ICD-10-CM | POA: Insufficient documentation

## 2016-09-21 DIAGNOSIS — Z803 Family history of malignant neoplasm of breast: Secondary | ICD-10-CM | POA: Insufficient documentation

## 2016-09-21 DIAGNOSIS — Z951 Presence of aortocoronary bypass graft: Secondary | ICD-10-CM | POA: Insufficient documentation

## 2016-09-21 DIAGNOSIS — I2510 Atherosclerotic heart disease of native coronary artery without angina pectoris: Secondary | ICD-10-CM | POA: Diagnosis not present

## 2016-09-21 DIAGNOSIS — I10 Essential (primary) hypertension: Secondary | ICD-10-CM | POA: Insufficient documentation

## 2016-09-21 DIAGNOSIS — R69 Illness, unspecified: Secondary | ICD-10-CM | POA: Diagnosis not present

## 2016-09-21 NOTE — Patient Instructions (Addendum)
Your physician has recommended you make the following change in your medication:  1)Increase amiodarone to 400mg  twice a day until cardioversion then reduce to 200mg  twice a day   Cardioversion scheduled for Thursday, December 21st  - Arrive at the [facility] and go to admitting at 9:30AM  -Do not eat or drink anything after midnight the night prior to your procedure.  - Take all your medication with a sip of water prior to arrival.  - You will not be able to drive home after your procedure.

## 2016-09-21 NOTE — Progress Notes (Signed)
Primary Care Physician: No PCP Per Patient Referring Physician: Dr. Clide Deutscher is a 65 y.o. male with a h/o CAD status post 2 vessel CABG surgery in 2004 at [facility] with low risk nuclear stress test in 2016, paroxysmal atrial fibrillation, obstructive sleep apnea fully compliant with CPAP, HTN, HLD, sinus bradycardia, obesity, spinal stenosis, presented to [facility] with chest pain and tachypalpitations. He was found to be back in atrial fib with RVR and converted to NSR in the ED. His EKG was without ischemic changes. He was admitted for initiation of antiarrhythmic therapy with sotalol at 80mg  BID. His Toprol was stopped to allow for this. His diltiazem dose was decreased for sinus bradycardia. TSH was normal. Mg was 2.1. Potassium ranged 3.7-4.1.  Eliquis was continued. Not on ASA due to need for Eliquis and no active ACS. His celexa was switched to sertraline due to QTc prolongation with the former. Morning of d/c, he was maintaining NSR with QTc 500 msec.  He is in the afib clinic for f/u. He has had 3 episodes of afib since d/c. The shortest was 5 mins and the longest was almost 3 hours. Usually will return to SR  with a as needed metoprolol. He does have CPAP that he uses religiously. Occasional beer. Exercises 3x a week on a regular basis at the gym but struggles to lose weight.  Return to afib clinic 12/18. Since I saw patient, he continued to have breakthrough afib and ultimately was changed to amiodarone from sotalol  and had an ablation 12/12. He called and asked to be seen today for increased heart rate more noticeable with exertion for the last 3 days. Ekg shows aflutter at 144 bpm. He takes prn metoprolol very begrudgingly due to BB making him feel very fatigued.   Today, he denies symptoms of chest pain, orthopnea, PND, lower extremity edema, dizziness, presyncope, syncope, or neurologic sequela. Positive for palpitations and exertional dyspnea.The patient is tolerating  medications without difficulties and is otherwise without complaint today.   Past Medical History:  Diagnosis Date  . Anxiety   . Chronic chest pain   . Chronic sinus bradycardia   . Coronary artery disease    2v CABG, 2004 [facility])  . DJD (degenerative joint disease)   . Edema   . HLD (hyperlipidemia)   . Hypertension   . MI, old 2004  . OA (osteoarthritis)   . Obesity   . PAF (paroxysmal atrial fibrillation) (HCC)    a. started on sotalol 08/2016.  Marland Kitchen Palpitations    Reported history of PVCs  . QT prolongation   . Sleep apnea    on CPAP  . Spinal stenosis   . Typical atrial flutter [facility])    Past Surgical History:  Procedure Laterality Date  . BACK SURGERY  2011  . CARPAL TUNNEL RELEASE     bilateral  . CORONARY ARTERY BYPASS GRAFT     2004  . ELECTROPHYSIOLOGIC STUDY [name] 09/15/2016   Procedure: Atrial Fibrillation Ablation;  Surgeon: Hillis Range, MD;  Location: [facility] INVASIVE CV LAB;  Service: Cardiovascular;  Laterality: [name];  . fistula surgery    . hemorrhoidectomy    . LUMBAR LAMINECTOMY    . right foot fracture    . TEE WITHOUT CARDIOVERSION [name] 09/15/2016   Procedure: TRANSESOPHAGEAL ECHOCARDIOGRAM (TEE);  Surgeon: Lewayne Bunting, MD;  Location: [facility] ENDOSCOPY;  Service: Cardiovascular;  Laterality: [name];  . TRANSTHORACIC ECHOCARDIOGRAM  08/25/2010   EF 55-60%  Current Outpatient Prescriptions  Medication Sig Dispense Refill  . acetaminophen (TYLENOL) 650 MG CR tablet Take 650 mg by mouth every 8 (eight) hours as needed for pain.     Marland Kitchen. amiodarone (PACERONE) 200 MG tablet Take 1 tablet (200 mg total) by mouth 2 (two) times daily. Take 200mg  twice daily for 2 weeks then 200mg  daily 85 tablet 6  . apixaban (ELIQUIS) 5 MG TABS tablet Take 1 tablet (5 mg total) by mouth 2 (two) times daily. 56 tablet 0  . atorvastatin (LIPITOR) 20 MG tablet Take 1 tablet (20 mg total) by mouth daily. (Patient taking differently: Take 20 mg by mouth daily at 6 PM. ) 90 tablet 3  .  chlorthalidone (HYGROTON) 25 MG tablet Take 1.5 tablets (37.5 mg total) by mouth daily. 45 tablet 3  . Coenzyme Q10 (CO Q 10) 100 MG CAPS Take 100 mg by mouth daily.     Marland Kitchen. diltiazem (CARDIZEM CD) 360 MG 24 hr capsule Take 1 capsule (360 mg total) by mouth daily. 30 capsule 11  . fish oil-omega-3 fatty acids 1000 MG capsule Take 1 g by mouth 2 (two) times daily.     . irbesartan (AVAPRO) 300 MG tablet Take 1 tablet (300 mg total) by mouth at bedtime. 90 tablet 3  . metoprolol tartrate (LOPRESSOR) 25 MG tablet Take 25 mg by mouth as needed.    . Multiple Vitamin (MULTIVITAMIN) tablet Take 1 tablet by mouth daily.    Marland Kitchen. omeprazole (PRILOSEC) 20 MG capsule Take 20 mg by mouth every other day.     . sertraline (ZOLOFT) 25 MG tablet Take 1 tablet (25 mg total) by mouth daily. 30 tablet 1   No current facility-administered medications for this encounter.     Allergies  Allergen Reactions  . Nsaids Other (See Comments)    On blood thinner  . Vancomycin Other (See Comments)    Was informed after heart surgery ? Breathing problem/patient unsure    Social History   Social History  . Marital status: Married    Spouse name: [name]  . Number of children: 0  . 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 Smoking status: Never Smoker  . Smokeless tobacco: Never Used  . Alcohol use No  . Drug use: No  . Sexual activity: Yes   Other Topics Concern  . Not on file   Social History Narrative  . No narrative on file    Family History  Problem Relation Age of Onset  . Breast cancer Mother   . Hypertension Mother   . Coronary artery disease Father   . Stroke Father   . Hypertension Father     ROS- All systems are reviewed and negative except as per the HPI above  Physical Exam: Vitals:   09/21/16 1018  BP: (!) 146/72  Pulse: (!) 144  Weight: 251 lb 12.8 oz (114.2 kg)  Height: 5\' 5"  (1.651 m)    GEN- The  patient is well appearing, alert and oriented x 3 today.   Head- normocephalic, atraumatic Eyes-  Sclera clear, conjunctiva pink Ears- hearing intact Oropharynx- clear Neck- supple, no JVP Lymph- no cervical lymphadenopathy Lungs- Clear to ausculation bilaterally, normal work of breathing Heart- Rapid, regular   rate and rhythm, no murmurs, rubs or gallops, PMI not laterally displaced GI- soft, NT, ND, + BS Extremities- no clubbing, cyanosis, or edema MS- no significant deformity or  atrophy Skin- no rash or lesion Psych- euthymic mood, full affect Neuro- strength and sensation are intact  EKG- Aflutter at 144 bpm, EKG reads critical test acute MI- pt is not having chest pain and ekg changes are thought secondary to flutter waves. Epic records reviewed  Assessment and Plan: 1. Paroxysmal afib S/p ablation 12/12 with return of a flutter with rvr He does not tolerate increased doses of metoprolol due to fatigue Increase amiodarone to 400 mg bid until cardioversion, then reduce to 200 mg bid, pt is aware will to reduce to 200 mg daily Cardioversion will be scheduled next available which is 12/21 States NO missed doses of apixaban  Bmet/cbc up to date Pt reassured as he is quite anxious   2. Lifestyle issues Congratulated on regular gym schedule Try to lose 5-10% of current body weight Decrease beer to no more than 2 a week Continue regular use of cpap  F/u afib clinic one week post cardioversion  Lupita Leash C. [facility] [address] [place], [place] [address] [phone]

## 2016-09-22 ENCOUNTER — Telehealth: Payer: Self-pay | Admitting: *Deleted

## 2016-09-22 DIAGNOSIS — G4733 Obstructive sleep apnea (adult) (pediatric): Secondary | ICD-10-CM | POA: Diagnosis not present

## 2016-09-22 MED ORDER — SERTRALINE HCL 25 MG PO TABS: 25.0000 mg | ORAL_TABLET | Freq: Every day | ORAL | 3 refills | Status: DC

## 2016-09-22 NOTE — Telephone Encounter (Signed)
Medication sent to pharmacy  

## 2016-09-22 NOTE — Telephone Encounter (Signed)
-----   Message from Antoine Poche, MD sent at 09/22/2016 12:48 PM EST ----- That is ok with me   Dominga Ferry MD ----- Message ----- From: Albertine Patricia, CMA Sent: 09/22/2016  12:03 PM To: Antoine Poche, MD  Can we send in refills for 6 months supply of Zoloft until pt can establish with primary? According to hospital notes Dr. Rennis Golden restarted this and pt says that he was told we could refill this for him until established with pcp  [facility]

## 2016-09-24 ENCOUNTER — Encounter (HOSPITAL_COMMUNITY): Payer: Self-pay | Admitting: Certified Registered Nurse Anesthetist

## 2016-09-24 ENCOUNTER — Ambulatory Visit (HOSPITAL_COMMUNITY)
Admission: RE | Admit: 2016-09-24 | Discharge: 2016-09-24 | Disposition: A | Discharge status: Home / Self Care | Payer: Medicare HMO | Source: Ambulatory Visit | Attending: Nurse Practitioner | Admitting: Nurse Practitioner

## 2016-09-24 ENCOUNTER — Ambulatory Visit (HOSPITAL_COMMUNITY): Admission: RE | Admit: 2016-09-24 | Payer: Medicare HMO | Source: Ambulatory Visit | Admitting: Internal Medicine

## 2016-09-24 ENCOUNTER — Encounter (HOSPITAL_COMMUNITY): Admission: RE | Payer: Self-pay | Source: Ambulatory Visit

## 2016-09-24 DIAGNOSIS — Z79899 Other long term (current) drug therapy: Secondary | ICD-10-CM | POA: Insufficient documentation

## 2016-09-24 DIAGNOSIS — I4891 Unspecified atrial fibrillation: Secondary | ICD-10-CM | POA: Diagnosis not present

## 2016-09-24 SURGERY — CARDIOVERSION
Anesthesia: Monitor Anesthesia Care

## 2016-09-24 NOTE — Progress Notes (Addendum)
Pt in for ekg prior to dccv to assess rhythm. Pt presented today in NSR 66. DCCV was canceled. Pt was instructed to return to his normal dosing of Amiodarone of 200mg  BID for rest of load.   EKG reviewed and is SR. DCCV cancelled. Continue amiodarone at usual load. He did increase amio to 400 mg bid x 2 days and now will continue 200 mg bid and then 200 mg a day per instructions. Pt is s/p ablation

## 2016-10-02 ENCOUNTER — Ambulatory Visit (HOSPITAL_COMMUNITY): Payer: Medicare HMO | Admitting: Nurse Practitioner

## 2016-10-13 ENCOUNTER — Ambulatory Visit (HOSPITAL_COMMUNITY)
Admit: 2016-10-13 | Discharge: 2016-10-13 | Disposition: A | Discharge status: Home / Self Care | Payer: Medicare HMO | Source: Ambulatory Visit | Attending: Nurse Practitioner | Admitting: Nurse Practitioner

## 2016-10-13 ENCOUNTER — Encounter (HOSPITAL_COMMUNITY): Payer: Self-pay | Admitting: Nurse Practitioner

## 2016-10-13 VITALS — BP 164/84 | HR 67 | Ht 65.0 in | Wt 256.8 lb

## 2016-10-13 DIAGNOSIS — I480 Paroxysmal atrial fibrillation: Secondary | ICD-10-CM | POA: Diagnosis not present

## 2016-10-13 DIAGNOSIS — Z6841 Body Mass Index (BMI) 40.0 and over, adult: Secondary | ICD-10-CM | POA: Diagnosis not present

## 2016-10-13 DIAGNOSIS — Z853 Personal history of malignant neoplasm of breast: Secondary | ICD-10-CM | POA: Insufficient documentation

## 2016-10-13 DIAGNOSIS — I10 Essential (primary) hypertension: Secondary | ICD-10-CM | POA: Insufficient documentation

## 2016-10-13 DIAGNOSIS — Z823 Family history of stroke: Secondary | ICD-10-CM | POA: Diagnosis not present

## 2016-10-13 DIAGNOSIS — Z79899 Other long term (current) drug therapy: Secondary | ICD-10-CM | POA: Insufficient documentation

## 2016-10-13 DIAGNOSIS — E785 Hyperlipidemia, unspecified: Secondary | ICD-10-CM | POA: Diagnosis not present

## 2016-10-13 DIAGNOSIS — R69 Illness, unspecified: Secondary | ICD-10-CM | POA: Diagnosis not present

## 2016-10-13 DIAGNOSIS — G4733 Obstructive sleep apnea (adult) (pediatric): Secondary | ICD-10-CM | POA: Insufficient documentation

## 2016-10-13 DIAGNOSIS — Z8249 Family history of ischemic heart disease and other diseases of the circulatory system: Secondary | ICD-10-CM | POA: Insufficient documentation

## 2016-10-13 DIAGNOSIS — E669 Obesity, unspecified: Secondary | ICD-10-CM | POA: Diagnosis not present

## 2016-10-13 DIAGNOSIS — I2510 Atherosclerotic heart disease of native coronary artery without angina pectoris: Secondary | ICD-10-CM | POA: Diagnosis not present

## 2016-10-13 DIAGNOSIS — Z7901 Long term (current) use of anticoagulants: Secondary | ICD-10-CM | POA: Diagnosis not present

## 2016-10-13 DIAGNOSIS — Z951 Presence of aortocoronary bypass graft: Secondary | ICD-10-CM | POA: Diagnosis not present

## 2016-10-13 DIAGNOSIS — Z881 Allergy status to other antibiotic agents status: Secondary | ICD-10-CM | POA: Insufficient documentation

## 2016-10-13 DIAGNOSIS — I252 Old myocardial infarction: Secondary | ICD-10-CM | POA: Insufficient documentation

## 2016-10-13 DIAGNOSIS — Z888 Allergy status to other drugs, medicaments and biological substances status: Secondary | ICD-10-CM | POA: Insufficient documentation

## 2016-10-13 DIAGNOSIS — I4891 Unspecified atrial fibrillation: Secondary | ICD-10-CM | POA: Diagnosis present

## 2016-10-13 DIAGNOSIS — M1990 Unspecified osteoarthritis, unspecified site: Secondary | ICD-10-CM | POA: Diagnosis not present

## 2016-10-13 DIAGNOSIS — F419 Anxiety disorder, unspecified: Secondary | ICD-10-CM | POA: Insufficient documentation

## 2016-10-13 NOTE — Progress Notes (Signed)
Primary Care Physician: No PCP Per Patient Referring Physician: Dr. Clide Deutscher is a 66 y.o. male with a h/o CAD status post 2 vessel CABG surgery in 2004 at [facility] with low risk nuclear stress test in 2016, paroxysmal atrial fibrillation, obstructive sleep apnea fully compliant with CPAP, HTN, HLD, sinus bradycardia, obesity, spinal stenosis, presented to [facility] with chest pain and tachypalpitations. He was found to be back in atrial fib with RVR and converted to NSR in the ED. His EKG was without ischemic changes. He was admitted for initiation of antiarrhythmic therapy with sotalol at 80mg  BID. His Toprol was stopped to allow for this. His diltiazem dose was decreased for sinus bradycardia. TSH was normal. Mg was 2.1. Potassium ranged 3.7-4.1.  Eliquis was continued. Not on ASA due to need for Eliquis and no active ACS. His celexa was switched to sertraline due to QTc prolongation with the former. Morning of d/c, he was maintaining NSR with QTc 500 msec.  He is in the afib clinic for f/u. He has had 3 episodes of afib since d/c. The shortest was 5 mins and the longest was almost 3 hours. Usually will return to SR  with a as needed metoprolol. He does have CPAP that he uses religiously. Occasional beer. Exercises 3x a week on a regular basis at the gym but struggles to lose weight.  Return to afib clinic 12/18. Since I saw patient, he continued to have breakthrough afib and ultimately was changed to amiodarone from sotalol  and had an ablation 12/12. He called and asked to be seen today for increased heart rate more noticeable with exertion for the last 3 days. Ekg shows aflutter at 144 bpm. He takes prn metoprolol very begrudgingly due to BB making him feel very fatigued.   F/u one month after ablation. He did convert spontaneously after last visit and di not need cardioversion. Today, he reports that he has had a few episodes of afib lasting 30 mins to 3-4 hours. Not as fast as prior to  ablation and does not feel   as uncomfortable with epiosdes. He stills remains anxious but reassured. BP's at home are in 130 range. No swallowing or groin issues. He will reduce amiodarone to 200 mg daily.  Today, he denies symptoms of chest pain, orthopnea, PND, lower extremity edema, dizziness, presyncope, syncope, or neurologic sequela. Positive for palpitations and exertional dyspnea. Periodic afib episodes.The patient is tolerating medications without difficulties and is otherwise without complaint today.   Past Medical History:  Diagnosis Date  . Anxiety   . Chronic chest pain   . Chronic sinus bradycardia   . Coronary artery disease    2v CABG, 2004 [facility])  . DJD (degenerative joint disease)   . Edema   . HLD (hyperlipidemia)   . Hypertension   . MI, old 2004  . OA (osteoarthritis)   . Obesity   . PAF (paroxysmal atrial fibrillation) (HCC)    a. started on sotalol 08/2016.  Marland Kitchen Palpitations    Reported history of PVCs  . QT prolongation   . Sleep apnea    on CPAP  . Spinal stenosis   . Typical atrial flutter [facility])    Past Surgical History:  Procedure Laterality Date  . BACK SURGERY  2011  . CARPAL TUNNEL RELEASE     bilateral  . CORONARY ARTERY BYPASS GRAFT     2004  . ELECTROPHYSIOLOGIC STUDY [name] 09/15/2016   Procedure: Atrial Fibrillation  Ablation;  Surgeon: Hillis Range, MD;  Location: [facility] INVASIVE CV LAB;  Service: Cardiovascular;  Laterality: [name];  . fistula surgery    . hemorrhoidectomy    . LUMBAR LAMINECTOMY    . right foot fracture    . TEE WITHOUT CARDIOVERSION [name] 09/15/2016   Procedure: TRANSESOPHAGEAL ECHOCARDIOGRAM (TEE);  Surgeon: Lewayne Bunting, MD;  Location: [facility] ENDOSCOPY;  Service: Cardiovascular;  Laterality: [name];  . TRANSTHORACIC ECHOCARDIOGRAM  08/25/2010   EF 55-60%    Current Outpatient Prescriptions  Medication Sig Dispense Refill  . acetaminophen (TYLENOL) 650 MG CR tablet Take 650 mg by mouth every 8 (eight) hours as needed for pain.      Marland Kitchen amiodarone (PACERONE) 200 MG tablet Take 1 tablet (200 mg total) by mouth 2 (two) times daily. Take 200mg  twice daily for 2 weeks then 200mg  daily 85 tablet 6  . apixaban (ELIQUIS) 5 MG TABS tablet Take 1 tablet (5 mg total) by mouth 2 (two) times daily. 56 tablet 0  . atorvastatin (LIPITOR) 20 MG tablet Take 1 tablet (20 mg total) by mouth daily. (Patient taking differently: Take 20 mg by mouth daily at 6 PM. ) 90 tablet 3  . chlorthalidone (HYGROTON) 25 MG tablet Take 1.5 tablets (37.5 mg total) by mouth daily. 45 tablet 3  . Coenzyme Q10 (CO Q 10) 100 MG CAPS Take 100 mg by mouth daily.     Marland Kitchen diltiazem (CARDIZEM CD) 360 MG 24 hr capsule Take 1 capsule (360 mg total) by mouth daily. 30 capsule 11  . fish oil-omega-3 fatty acids 1000 MG capsule Take 1 g by mouth 2 (two) times daily.     . irbesartan (AVAPRO) 300 MG tablet Take 1 tablet (300 mg total) by mouth at bedtime. 90 tablet 3  . metoprolol tartrate (LOPRESSOR) 25 MG tablet Take 25 mg by mouth as needed.    . Multiple Vitamin (MULTIVITAMIN) tablet Take 1 tablet by mouth daily.    Marland Kitchen omeprazole (PRILOSEC) 20 MG capsule Take 20 mg by mouth every other day.     . sertraline (ZOLOFT) 25 MG tablet Take 1 tablet (25 mg total) by mouth daily. 90 tablet 3   No current facility-administered medications for this encounter.     Allergies  Allergen Reactions  . Nsaids Other (See Comments)    On blood thinner  . Vancomycin Other (See Comments)    Was informed after heart surgery ? Breathing problem/patient unsure    Social History   Social History  . Marital status: Married    Spouse name: [name]  . Number of children: 0  . 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 Smoking status: Never Smoker  . Smokeless tobacco: Never Used  . Alcohol use No  . Drug use: No  . Sexual activity: Yes   Other Topics Concern  . Not on file   Social History Narrative  .  No narrative on file    Family History  Problem Relation Age of Onset  . Breast cancer Mother   . Hypertension Mother   . Coronary artery disease Father   . Stroke Father   . Hypertension Father     ROS- All systems are reviewed and negative except as per the HPI above  Physical Exam: Vitals:   10/13/16 1112  BP: (!) 164/84  Pulse: 67  Weight: 256 lb 12.8 oz (116.5 kg)  Height: 5\' 5"  (1.651  m)    GEN- The patient is well appearing, alert and oriented x 3 today.   Head- normocephalic, atraumatic Eyes-  Sclera clear, conjunctiva pink Ears- hearing intact Oropharynx- clear Neck- supple, no JVP Lymph- no cervical lymphadenopathy Lungs- Clear to ausculation bilaterally, normal work of breathing Heart-  regular rate and rhythm, no murmurs, rubs or gallops, PMI not laterally displaced GI- soft, NT, ND, + BS Extremities- no clubbing, cyanosis, or edema MS- no significant deformity or atrophy Skin- no rash or lesion Psych- euthymic mood, full affect Neuro- strength and sensation are intact  EKG-NSR at 67 bpm, IRBBB, inferior infarct, age undetermined, pr int 172 ms, qrs int 104 ms , qtc 490 ms Epic records reviewed  Assessment and Plan: 1. Paroxysmal afib S/p ablation 12/12,having a few breakthrough episodes but short lived He  Is using low dose BB when this occurs Decrease  amiodarone to 200 mg qd tommorrow  Continue apixaban without missed doses Pt reassured as he is quite anxious   2. Lifestyle issues Congratulated on regular gym schedule Try to lose 5-10% of current body weight Decrease beer to no more than 2 a week Continue regular use of cpap  F/u afib clinic as needed  [name] 2/28 Dr. Johney Frame 3/19  Elvina Sidle. [facility] Fol[facility] [address] [place], [place] [address] [phone]

## 2016-10-14 DIAGNOSIS — G4733 Obstructive sleep apnea (adult) (pediatric): Secondary | ICD-10-CM | POA: Diagnosis not present

## 2016-10-23 ENCOUNTER — Telehealth (HOSPITAL_COMMUNITY): Payer: Self-pay | Admitting: Nurse Practitioner

## 2016-10-23 NOTE — Telephone Encounter (Signed)
Pt is still having breakthrough afib and is now loaded on amiodarone 200 mg a day.He has not taken metoprolol daily due to fatigue in the past but has taken as needed. The afib seems to be triggered at the gym. Discussed that he will try to take metoprolol 12.5 mg bid for the next week and see how that works to offset afib. He will not go to gym next week but try to restart the following week. He will let me know how he is doing after starts BB daily.

## 2016-11-06 ENCOUNTER — Telehealth (HOSPITAL_COMMUNITY): Payer: Self-pay | Admitting: *Deleted

## 2016-11-06 NOTE — Telephone Encounter (Signed)
Patient called in stating since start metoprolol 12.5mg  BID he has had no recurrence of afib. He has been back in the gym the past week some chest pain with exercise with HR in the 80-90 range. When at rest his HR is in the 48-50 range but is asymptomatic with these readings. Discussed with Rudi Coco NP who recommended to continue metoprolol twice a day until he sees Dr. Johney Frame for follow up in March. Pt verbalized understanding.

## 2016-11-14 DIAGNOSIS — G4733 Obstructive sleep apnea (adult) (pediatric): Secondary | ICD-10-CM | POA: Diagnosis not present

## 2016-12-02 ENCOUNTER — Ambulatory Visit: Payer: Medicare HMO | Admitting: Cardiology

## 2016-12-12 DIAGNOSIS — G4733 Obstructive sleep apnea (adult) (pediatric): Secondary | ICD-10-CM | POA: Diagnosis not present

## 2016-12-21 ENCOUNTER — Ambulatory Visit (INDEPENDENT_AMBULATORY_CARE_PROVIDER_SITE_OTHER): Payer: Medicare HMO | Admitting: Internal Medicine

## 2016-12-21 ENCOUNTER — Encounter: Payer: Self-pay | Admitting: Internal Medicine

## 2016-12-21 VITALS — BP 170/84 | HR 57 | Ht 66.0 in | Wt 258.6 lb

## 2016-12-21 DIAGNOSIS — I480 Paroxysmal atrial fibrillation: Secondary | ICD-10-CM

## 2016-12-21 DIAGNOSIS — I4892 Unspecified atrial flutter: Secondary | ICD-10-CM

## 2016-12-21 DIAGNOSIS — I10 Essential (primary) hypertension: Secondary | ICD-10-CM

## 2016-12-21 DIAGNOSIS — Z79899 Other long term (current) drug therapy: Secondary | ICD-10-CM | POA: Diagnosis not present

## 2016-12-21 DIAGNOSIS — I2510 Atherosclerotic heart disease of native coronary artery without angina pectoris: Secondary | ICD-10-CM | POA: Diagnosis not present

## 2016-12-21 NOTE — Patient Instructions (Addendum)
Medication Instructions: - Your physician has recommended you make the following change in your medication:  1) Decrease amiodarone 200 mg- take 1/2 tablet (100 mg) by mouth once daily 2) Change lopressor (metoprolol tartrate) 25 mg- take 1/2 tablet (12.5 mg) by mouth twice daily AS NEEDED only  Labwork: - none ordered  Procedures/Testing: - none ordered  Follow-Up: - Your physician recommends that you schedule a follow-up appointment in: 3 months with Dr. Johney Frame.    Any Additional Special Instructions Will Be Listed Below (If Applicable).     If you need a refill on your cardiac medications before your next appointment, please call your pharmacy.

## 2016-12-21 NOTE — Progress Notes (Signed)
PCP: No PCP Per Patient Primary Cardiologist:  Dr Clide Deutscher is a 66 y.o. male who presents today for routine electrophysiology followup.  Since his recent afib ablation, the patient reports doing very well.  AF is well controlled currently (ERAF during first month noted).  Denies procedure related complications.  He is working on lifestyle change.  Today, he denies symptoms of palpitations, chest pain, shortness of breath,  lower extremity edema, dizziness, presyncope, or syncope.  The patient is otherwise without complaint today.   Past Medical History:  Diagnosis Date  . Anxiety   . Chronic chest pain   . Chronic sinus bradycardia   . Coronary artery disease    2v CABG, 2004 [facility])  . DJD (degenerative joint disease)   . Edema   . HLD (hyperlipidemia)   . Hypertension   . MI, old 2004  . OA (osteoarthritis)   . Obesity   . PAF (paroxysmal atrial fibrillation) (HCC)    a. started on sotalol 08/2016.  Marland Kitchen Palpitations    Reported history of PVCs  . QT prolongation   . Sleep apnea    on CPAP  . Spinal stenosis   . Typical atrial flutter [facility])    Past Surgical History:  Procedure Laterality Date  . BACK SURGERY  2011  . CARPAL TUNNEL RELEASE     bilateral  . CORONARY ARTERY BYPASS GRAFT     2004  . ELECTROPHYSIOLOGIC STUDY [name] 09/15/2016   Procedure: Atrial Fibrillation Ablation;  Surgeon: Hillis Range, MD;  Location: [facility] INVASIVE CV LAB;  Service: Cardiovascular;  Laterality: [name];  . fistula surgery    . hemorrhoidectomy    . LUMBAR LAMINECTOMY    . right foot fracture    . TEE WITHOUT CARDIOVERSION [name] 09/15/2016   Procedure: TRANSESOPHAGEAL ECHOCARDIOGRAM (TEE);  Surgeon: Lewayne Bunting, MD;  Location: [facility] ENDOSCOPY;  Service: Cardiovascular;  Laterality: [name];  . TRANSTHORACIC ECHOCARDIOGRAM  08/25/2010   EF 55-60%    ROS- all systems are reviewed and negatives except as per HPI above  Current Outpatient Prescriptions  Medication Sig Dispense Refill  .  acetaminophen (TYLENOL) 650 MG CR tablet Take 650 mg by mouth every 8 (eight) hours as needed for pain.     Marland Kitchen amiodarone (PACERONE) 200 MG tablet Take 200 mg by mouth daily.    Marland Kitchen apixaban (ELIQUIS) 5 MG TABS tablet Take 1 tablet (5 mg total) by mouth 2 (two) times daily. 56 tablet 0  . atorvastatin (LIPITOR) 20 MG tablet Take 1 tablet (20 mg total) by mouth daily. 90 tablet 3  . chlorthalidone (HYGROTON) 25 MG tablet Take 1.5 tablets (37.5 mg total) by mouth daily. 45 tablet 3  . Coenzyme Q10 (CO Q 10) 100 MG CAPS Take 100 mg by mouth daily.     Marland Kitchen diltiazem (CARDIZEM CD) 360 MG 24 hr capsule Take 1 capsule (360 mg total) by mouth daily. 30 capsule 11  . fish oil-omega-3 fatty acids 1000 MG capsule Take 1 g by mouth 2 (two) times daily.     . irbesartan (AVAPRO) 300 MG tablet Take 1 tablet (300 mg total) by mouth at bedtime. 90 tablet 3  . metoprolol tartrate (LOPRESSOR) 25 MG tablet Take 12.5 mg by mouth 2 (two) times daily.     . Multiple Vitamin (MULTIVITAMIN) tablet Take 1 tablet by mouth daily.    Marland Kitchen omeprazole (PRILOSEC) 20 MG capsule Take 20 mg by mouth every other day.     . sertraline (  ZOLOFT) 25 MG tablet Take 1 tablet (25 mg total) by mouth daily. 90 tablet 3   No current facility-administered medications for this visit.     Physical Exam: Vitals:   12/21/16 1102  BP: (!) 170/84  Pulse: (!) 57  SpO2: 96%  Weight: 258 lb 9.6 oz (117.3 kg)  Height: 5\' 6"  (1.676 m)    GEN- The patient is overweight appearing, alert and oriented x 3 today.   Head- normocephalic, atraumatic Eyes-  Sclera clear, conjunctiva pink Ears- hearing intact Oropharynx- clear Lungs- Clear to ausculation bilaterally, normal work of breathing Heart- Regular rate and rhythm, no murmurs, rubs or gallops, PMI not laterally displaced GI- soft, NT, ND, + BS Extremities- no clubbing, cyanosis, or edema  ekg today reveals sinus rhythm 57 bpm, PR 176 msec, incomplete RBBB  Assessment and Plan:  1. afib/  atrial flutter Maintaining sinus rhythm post ablation Reduce amiodarone to 100mg  daily today Continue eliquis Consider stopping amiodarone upon return in 3 months Change metoprolol to prn per patient request  2.  Hypertensive cardiovascular disease Elevated today He feels that it is better controlled at home and does not wish to make changes May benefit from switching diltiazem to amlodipine if afib remains controlled  3. Obesity Body mass index is 41.74 kg/m. Continue to work on lifestyle modification He is going to the gym which is reassuring  4. OSA Compliance with CPAP encouraged  5. CAD No ischemic symptoms  Follow--up with Dr Wyline Mood as scheduled Return to see me in 3 months  Hillis Range MD, [facility] 12/21/2016 11:58 AM

## 2016-12-22 ENCOUNTER — Encounter: Payer: Self-pay | Admitting: Cardiology

## 2016-12-22 ENCOUNTER — Other Ambulatory Visit: Payer: Self-pay | Admitting: Cardiology

## 2016-12-22 ENCOUNTER — Ambulatory Visit (INDEPENDENT_AMBULATORY_CARE_PROVIDER_SITE_OTHER): Payer: Medicare HMO | Admitting: Cardiology

## 2016-12-22 VITALS — BP 161/72 | HR 56 | Ht 66.0 in | Wt 261.0 lb

## 2016-12-22 DIAGNOSIS — E782 Mixed hyperlipidemia: Secondary | ICD-10-CM

## 2016-12-22 DIAGNOSIS — G4733 Obstructive sleep apnea (adult) (pediatric): Secondary | ICD-10-CM

## 2016-12-22 DIAGNOSIS — I10 Essential (primary) hypertension: Secondary | ICD-10-CM

## 2016-12-22 DIAGNOSIS — I2510 Atherosclerotic heart disease of native coronary artery without angina pectoris: Secondary | ICD-10-CM | POA: Diagnosis not present

## 2016-12-22 MED ORDER — CHLORTHALIDONE 25 MG PO TABS: 37.5000 mg | ORAL_TABLET | Freq: Every day | ORAL | 3 refills | Status: DC

## 2016-12-22 MED ORDER — APIXABAN 5 MG PO TABS: 5.0000 mg | ORAL_TABLET | Freq: Two times a day (BID) | ORAL | 3 refills | Status: DC

## 2016-12-22 MED ORDER — DILTIAZEM HCL ER COATED BEADS 360 MG PO CP24: 360.0000 mg | ORAL_CAPSULE | Freq: Every day | ORAL | 3 refills | Status: DC

## 2016-12-22 MED ORDER — SERTRALINE HCL 25 MG PO TABS: 25.0000 mg | ORAL_TABLET | Freq: Every day | ORAL | 0 refills | Status: DC

## 2016-12-22 MED ORDER — IRBESARTAN 300 MG PO TABS: 300.0000 mg | ORAL_TABLET | Freq: Every day | ORAL | 3 refills | Status: DC

## 2016-12-22 MED ORDER — ATORVASTATIN CALCIUM 20 MG PO TABS
20.0000 mg | ORAL_TABLET | Freq: Every day | ORAL | 3 refills | Status: DC
Start: 2016-12-22 — End: 2017-09-07

## 2016-12-22 NOTE — Progress Notes (Signed)
Clinical Summary [name] [name] is a 66 y.o.male seen today for follow up of the followig medical problems.   1. HTN   - had gynecomastia on aldactone -Clinic numbers are often elevated. Since last visit  home bp's 130s/70s.   2. CAD  - prior 2 vessel CABG in 2004 at [facility]  - apparently had follow up cath in 2006 from [place] with patent grafts. Reports negative stress test in 2011 by Dr Hyacinth Meeker in [place]. 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 Hyperlipidemia  - noted some side effects on higher lipitor doses, has tolerated lower dose well  4. OSA  - compliant with CPAP  5. Afib - s/p ablation. He had failed rate control as well as multiple antiarrhythmics - amio reduced to 100mg  yesterday by EP at f/u - continues on eliquis  - no recent palpitations  Past Medical History:  Diagnosis Date  . Anxiety   . Chronic chest pain   . Chronic sinus bradycardia   . Coronary artery disease    2v CABG, 2004 [facility])  . DJD (degenerative joint disease)   . Edema   . HLD (hyperlipidemia)   . Hypertension   . MI, old 2004  . OA (osteoarthritis)   . Obesity   . PAF (paroxysmal atrial fibrillation) (HCC)    a. started on sotalol 08/2016.  Marland Kitchen Palpitations    Reported history of PVCs  . QT prolongation   . Sleep apnea    on CPAP  . Spinal stenosis   . Typical atrial flutter (HCC)      Allergies  Allergen Reactions  . Nsaids Other (See Comments)    On blood thinner  . Vancomycin Other (See Comments)    Was informed after heart surgery ? Breathing problem/patient unsure     Current Outpatient Prescriptions  Medication Sig Dispense Refill  . acetaminophen (TYLENOL) 650 MG CR tablet Take 650 mg by mouth every 8 (eight) hours as needed for pain.     Marland Kitchen amiodarone (PACERONE) 200 MG tablet Take 1/2 tablet (100 mg) by mouth once daily     . apixaban (ELIQUIS) 5 MG TABS tablet Take 1 tablet (5 mg total) by mouth 2 (two) times daily. 56 tablet 0  . atorvastatin (LIPITOR) 20 MG tablet Take 1 tablet (20 mg total) by mouth daily. 90 tablet 3  . chlorthalidone (HYGROTON) 25 MG tablet TAKE 1.5 TABLETS (37.5 MG TOTAL) BY MOUTH DAILY. 45 tablet 3  . Coenzyme Q10 (CO Q 10) 100 MG CAPS Take 100 mg by mouth daily.     Marland Kitchen diltiazem (CARDIZEM CD) 360 MG 24 hr capsule Take 1 capsule (360 mg total) by mouth daily. 30 capsule 11  . fish oil-omega-3 fatty acids 1000 MG capsule Take 1 g by mouth 2 (two) times daily.     . irbesartan (AVAPRO) 300 MG tablet Take 1 tablet (300 mg total) by mouth at bedtime. 90 tablet 3  . metoprolol tartrate (LOPRESSOR) 25 MG tablet Take 1/2 tablet (12.5 mg) by mouth twice daily  AS NEEDED    . Multiple Vitamin (MULTIVITAMIN) tablet Take 1 tablet by mouth daily.    Marland Kitchen omeprazole (PRILOSEC) 20 MG capsule Take 20 mg by mouth every other day.     . sertraline (ZOLOFT) 25 MG tablet Take 1 tablet (25 mg total) by mouth daily. 90 tablet  3   No current facility-administered medications for this visit.      Past Surgical History:  Procedure Laterality Date  . BACK SURGERY  2011  . CARPAL TUNNEL RELEASE     bilateral  . CORONARY ARTERY BYPASS GRAFT     2004  . ELECTROPHYSIOLOGIC STUDY [name] 09/15/2016   Procedure: Atrial Fibrillation Ablation;  Surgeon: Hillis Range, MD;  Location: [facility] INVASIVE CV LAB;  Service: Cardiovascular;  Laterality: [name];  . fistula surgery    . hemorrhoidectomy    . LUMBAR LAMINECTOMY    . right foot fracture    . TEE WITHOUT CARDIOVERSION [name] 09/15/2016   Procedure: TRANSESOPHAGEAL ECHOCARDIOGRAM (TEE);  Surgeon: Lewayne Bunting, MD;  Location: [facility] ENDOSCOPY;  Service: Cardiovascular;  Laterality: [name];  . TRANSTHORACIC ECHOCARDIOGRAM  08/25/2010   EF 55-60%     Allergies  Allergen Reactions  . Nsaids Other (See Comments)    On blood thinner  . Vancomycin Other (See Comments)    Was  informed after heart surgery ? Breathing problem/patient unsure      Family History  Problem Relation Age of Onset  . Breast cancer Mother   . Hypertension Mother   . Coronary artery disease Father   . Stroke Father   . Hypertension Father      Social History [name] [name] reports that he has never smoked. He has never used smokeless tobacco. [name] [name] reports that he does not drink alcohol.   Review of Systems CONSTITUTIONAL: No weight loss, fever, chills, weakness or fatigue.  HEENT: Eyes: No visual loss, blurred vision, double vision or yellow sclerae.No hearing loss, sneezing, congestion, runny nose or sore throat.  SKIN: No rash or itching.  CARDIOVASCULAR: per hpi  RESPIRATORY: No shortness of breath, cough or sputum.  GASTROINTESTINAL: No anorexia, nausea, vomiting or diarrhea. No abdominal pain or blood.  GENITOURINARY: No burning on urination, no polyuria NEUROLOGICAL: No headache, dizziness, syncope, paralysis, ataxia, numbness or tingling in the extremities. No change in bowel or bladder control.  MUSCULOSKELETAL: No muscle, back pain, joint pain or stiffness.  LYMPHATICS: No enlarged nodes. No history of splenectomy.  PSYCHIATRIC: No history of depression or anxiety.  ENDOCRINOLOGIC: No reports of sweating, cold or heat intolerance. No polyuria or polydipsia.  Marland Kitchen   Physical Examination Vitals:   12/22/16 0902  BP: (!) 161/72  Pulse: (!) 56   Vitals:   12/22/16 0902  Weight: 261 lb (118.4 kg)  Height: 5\' 6"  (1.676 m)    Gen: resting comfortably, no acute distress HEENT: no scleral icterus, pupils equal round and reactive, no palptable cervical adenopathy,  CV: RRR, no m/r/g, no jvd Resp: Clear to auscultation bilaterally GI: abdomen is soft, non-tender, non-distended, normal bowel sounds, no hepatosplenomegaly MSK: extremities are warm, no edema.  Skin: warm, no rash Neuro:  no focal deficits Psych: appropriate affect   Diagnostic Studies 03/2015  echo Study Conclusions  - Left ventricle: The cavity size was normal. Wall thickness was increased in a pattern of mild LVH. Systolic function was normal. The estimated ejection fraction was in the range of 50% to 55%. Wall motion was normal; there were no regional wall motion abnormalities. Doppler parameters are consistent with high ventricular filling pressure. - Left atrium: The atrium was mildly dilated.   03/2015 Lexiscan MPI  No T wave inversion was noted during stress.  There was no ST segment deviation noted during stress.  The study is normal.  This is a low risk study.  The left  ventricular ejection fraction is normal (55-65%).  Nuclear stress EF: 61%.    Assessment and Plan   1. HTN - often with elevated clinic numbers, home bp's are at goal - continue current meds   2. CAD  - no current symptoms  - continue current meds at this time.   3. Hyperlipidemia  - continue current statin, did not tolerate higher dose statin - we will repeat lipid panel  4. OSA  - continue CPAP  5. Afib -doing well s/p ablation.  - continue current meds, continue to follow with EP.   6. Morbid obesity - counseled on weight loss and dietary changes  Antoine Poche, M.D

## 2016-12-22 NOTE — Patient Instructions (Signed)
Medication Instructions:  Continue all current medications.  Labwork: none  Testing/Procedures: none  Follow-Up: Your physician wants you to follow up in: 6 months.  You will receive a reminder letter in the mail one-two months in advance.  If you don't receive a letter, please call our office to schedule the follow up appointment   Any Other Special Instructions Will Be Listed Below (If Applicable).  List of primary physicians provided today.   If you need a refill on your cardiac medications before your next appointment, please call your pharmacy.

## 2017-01-05 DIAGNOSIS — G4733 Obstructive sleep apnea (adult) (pediatric): Secondary | ICD-10-CM | POA: Diagnosis not present

## 2017-01-12 DIAGNOSIS — G4733 Obstructive sleep apnea (adult) (pediatric): Secondary | ICD-10-CM | POA: Diagnosis not present

## 2017-01-26 ENCOUNTER — Telehealth: Payer: Self-pay | Admitting: *Deleted

## 2017-01-26 NOTE — Telephone Encounter (Signed)
Patient wants to know if he can start glucosamine chondroitin taking 3 tablets daily with food for his knee and joint pain.

## 2017-01-26 NOTE — Telephone Encounter (Signed)
Patient informed. 

## 2017-01-26 NOTE — Telephone Encounter (Signed)
Ok to take  J Syenna Nazir MD 

## 2017-02-11 DIAGNOSIS — G4733 Obstructive sleep apnea (adult) (pediatric): Secondary | ICD-10-CM | POA: Diagnosis not present

## 2017-02-16 DIAGNOSIS — L905 Scar conditions and fibrosis of skin: Secondary | ICD-10-CM | POA: Diagnosis not present

## 2017-02-16 DIAGNOSIS — L720 Epidermal cyst: Secondary | ICD-10-CM | POA: Diagnosis not present

## 2017-02-16 DIAGNOSIS — L821 Other seborrheic keratosis: Secondary | ICD-10-CM | POA: Diagnosis not present

## 2017-02-16 DIAGNOSIS — Z85828 Personal history of other malignant neoplasm of skin: Secondary | ICD-10-CM | POA: Diagnosis not present

## 2017-02-16 DIAGNOSIS — L570 Actinic keratosis: Secondary | ICD-10-CM | POA: Diagnosis not present

## 2017-02-18 ENCOUNTER — Telehealth: Payer: Self-pay | Admitting: Cardiology

## 2017-02-18 MED ORDER — APIXABAN 5 MG PO TABS: 5.0000 mg | ORAL_TABLET | Freq: Two times a day (BID) | ORAL | 0 refills | Status: DC

## 2017-02-18 NOTE — Telephone Encounter (Signed)
Pt aware samples available for pick up

## 2017-02-18 NOTE — Telephone Encounter (Signed)
Would like some samples of Eliquis

## 2017-03-14 DIAGNOSIS — G4733 Obstructive sleep apnea (adult) (pediatric): Secondary | ICD-10-CM | POA: Diagnosis not present

## 2017-03-18 ENCOUNTER — Encounter: Payer: Self-pay | Admitting: Internal Medicine

## 2017-03-18 ENCOUNTER — Ambulatory Visit (INDEPENDENT_AMBULATORY_CARE_PROVIDER_SITE_OTHER): Payer: Medicare HMO | Admitting: Internal Medicine

## 2017-03-18 VITALS — BP 138/68 | HR 61 | Ht 66.0 in | Wt 250.0 lb

## 2017-03-18 DIAGNOSIS — I480 Paroxysmal atrial fibrillation: Secondary | ICD-10-CM

## 2017-03-18 DIAGNOSIS — I10 Essential (primary) hypertension: Secondary | ICD-10-CM

## 2017-03-18 DIAGNOSIS — G4733 Obstructive sleep apnea (adult) (pediatric): Secondary | ICD-10-CM | POA: Diagnosis not present

## 2017-03-18 MED ORDER — SERTRALINE HCL 25 MG PO TABS: 25.0000 mg | ORAL_TABLET | Freq: Every day | ORAL | 3 refills | Status: DC

## 2017-03-18 NOTE — Progress Notes (Signed)
PCP: Patient, No Pcp Per Primary Cardiologist: Dr Clide Deutscher is a 66 y.o. male who presents today for routine electrophysiology followup.  Since last being seen in our clinic, the patient reports doing very well.  No afib.  Very pleased with results of his ablation.  Today, he denies symptoms of palpitations, chest pain, shortness of breath,  lower extremity edema, dizziness, presyncope, or syncope.  The patient is otherwise without complaint today.   Past Medical History:  Diagnosis Date  . Anxiety   . Chronic chest pain   . Chronic sinus bradycardia   . Coronary artery disease    2v CABG, 2004 [facility])  . DJD (degenerative joint disease)   . Edema   . HLD (hyperlipidemia)   . Hypertension   . MI, old 2004  . OA (osteoarthritis)   . Obesity   . PAF (paroxysmal atrial fibrillation) (HCC)    a. started on sotalol 08/2016.  Marland Kitchen Palpitations    Reported history of PVCs  . QT prolongation   . Sleep apnea    on CPAP  . Spinal stenosis   . Typical atrial flutter [facility])    Past Surgical History:  Procedure Laterality Date  . BACK SURGERY  2011  . CARPAL TUNNEL RELEASE     bilateral  . CORONARY ARTERY BYPASS GRAFT     2004  . ELECTROPHYSIOLOGIC STUDY [name] 09/15/2016   Procedure: Atrial Fibrillation Ablation;  Surgeon: Hillis Range, MD;  Location: [facility] INVASIVE CV LAB;  Service: Cardiovascular;  Laterality: [name];  . fistula surgery    . hemorrhoidectomy    . LUMBAR LAMINECTOMY    . right foot fracture    . TEE WITHOUT CARDIOVERSION [name] 09/15/2016   Procedure: TRANSESOPHAGEAL ECHOCARDIOGRAM (TEE);  Surgeon: Lewayne Bunting, MD;  Location: [facility] ENDOSCOPY;  Service: Cardiovascular;  Laterality: [name];  . TRANSTHORACIC ECHOCARDIOGRAM  08/25/2010   EF 55-60%    ROS- all systems are reviewed and negatives except as per HPI above  Current Outpatient Prescriptions  Medication Sig Dispense Refill  . acetaminophen (TYLENOL) 650 MG CR tablet Take 650 mg by mouth every 8 (eight)  hours as needed for pain.     Marland Kitchen amiodarone (PACERONE) 200 MG tablet Take 1/2 tablet (100 mg) by mouth once daily    . apixaban (ELIQUIS) 5 MG TABS tablet Take 1 tablet (5 mg total) by mouth 2 (two) times daily. 42 tablet 0  . atorvastatin (LIPITOR) 20 MG tablet Take 1 tablet (20 mg total) by mouth daily. 90 tablet 3  . chlorthalidone (HYGROTON) 25 MG tablet Take 1.5 tablets (37.5 mg total) by mouth daily. 135 tablet 3  . Coenzyme Q10 (CO Q 10) 100 MG CAPS Take 100 mg by mouth daily.     Marland Kitchen diltiazem (CARDIZEM CD) 360 MG 24 hr capsule Take 1 capsule (360 mg total) by mouth daily. 90 capsule 3  . fish oil-omega-3 fatty acids 1000 MG capsule Take 1 g by mouth 2 (two) times daily.     . Glucosamine-Chondroit-Vit C-Mn (GLUCOSAMINE 1500 COMPLEX) CAPS Take 2 capsules by mouth daily.    . irbesartan (AVAPRO) 300 MG tablet Take 1 tablet (300 mg total) by mouth at bedtime. 90 tablet 3  . metoprolol tartrate (LOPRESSOR) 25 MG tablet Take 1/2 tablet (12.5 mg) by mouth twice daily  AS NEEDED    . Multiple Vitamin (MULTIVITAMIN) tablet Take 1 tablet by mouth daily.    Marland Kitchen omeprazole (PRILOSEC) 20 MG capsule Take 20 mg  by mouth every other day.     . sertraline (ZOLOFT) 25 MG tablet Take 1 tablet (25 mg total) by mouth daily. 90 tablet 0   No current facility-administered medications for this visit.     Physical Exam: Vitals:   03/18/17 0856  BP: 138/68  Pulse: 61  SpO2: 97%  Weight: 250 lb (113.4 kg)  Height: 5\' 6"  (1.676 m)    GEN- The patient is well appearing, alert and oriented x 3 today.   Head- normocephalic, atraumatic Eyes-  Sclera clear, conjunctiva pink Ears- hearing intact Oropharynx- clear Lungs- Clear to ausculation bilaterally, normal work of breathing Heart- Regular rate and rhythm, no murmurs, rubs or gallops, PMI not laterally displaced GI- soft, NT, ND, + BS Extremities- no clubbing, cyanosis, or edema  EKG tracing ordered today is personally reviewed and shows sinus rhythm 54  bpm, PR 170 msec, incomplete RBBB  Assessment and Plan:  1. Atrial fibrillation/ atrial flutter Doing well s/p ablation Stop amiodarone Continue eliquis  2. Hypertensive cardiovascular disease Stable No change required today  3. Obesity Body mass index is 40.35 kg/m. Lifestyle modification encouraged  4. OSA complaince with CPAP encouraged  5. CAD No ischemic symptoms  6. Anxiety He asks that I refill his zoloft which was started in the hospital when I treated his afib.  He has no PCP.   I was very clear that I expect him to obtain a PCP but will refill the medicine until he gets one.  Follow--up with Dr Wyline Mood in 3 months I will see in 6 months    Hillis Range MD, [facility] 03/18/2017 9:08 AM

## 2017-03-18 NOTE — Patient Instructions (Addendum)
Medication Instructions:  Your physician has recommended you make the following change in your medication:  1) Stop Amiodarone   Labwork: None ordered   Testing/Procedures: None ordered   Follow-Up: Your physician recommends that you schedule a follow-up appointment in: 3 months with Dr Wyline Mood and 6 months with Dr Johney Frame   Any Other Special Instructions Will Be Listed Below (If Applicable).     If you need a refill on your cardiac medications before your next appointment, please call your pharmacy.

## 2017-03-19 ENCOUNTER — Other Ambulatory Visit: Payer: Self-pay | Admitting: Cardiology

## 2017-03-29 ENCOUNTER — Ambulatory Visit: Payer: Medicare HMO | Admitting: Internal Medicine

## 2017-04-13 DIAGNOSIS — G4733 Obstructive sleep apnea (adult) (pediatric): Secondary | ICD-10-CM | POA: Diagnosis not present

## 2017-05-10 DIAGNOSIS — M170 Bilateral primary osteoarthritis of knee: Secondary | ICD-10-CM | POA: Diagnosis not present

## 2017-05-10 DIAGNOSIS — M25561 Pain in right knee: Secondary | ICD-10-CM | POA: Diagnosis not present

## 2017-05-10 DIAGNOSIS — M25562 Pain in left knee: Secondary | ICD-10-CM | POA: Diagnosis not present

## 2017-05-10 DIAGNOSIS — R262 Difficulty in walking, not elsewhere classified: Secondary | ICD-10-CM | POA: Diagnosis not present

## 2017-05-12 ENCOUNTER — Telehealth: Payer: Self-pay | Admitting: *Deleted

## 2017-05-12 DIAGNOSIS — G4733 Obstructive sleep apnea (adult) (pediatric): Secondary | ICD-10-CM | POA: Diagnosis not present

## 2017-05-12 NOTE — Telephone Encounter (Signed)
Pt requesting clearance for knee replacement - hasn't been scheduled yet [facility] orthopaedic) - does pt need appt for clearance? Has f/u scheduled for September however pt would like to have replacement done ASAP since he is in pain.

## 2017-05-13 NOTE — Telephone Encounter (Signed)
If he has not had any new episodes of chest pain or SOB since our last visit ok to proceed with surgery. He would need to hold his eliquis starting 2 days before surgery and resume the day after surgery   Dina Rich MD

## 2017-05-14 DIAGNOSIS — G4733 Obstructive sleep apnea (adult) (pediatric): Secondary | ICD-10-CM | POA: Diagnosis not present

## 2017-05-17 NOTE — Telephone Encounter (Signed)
Pt made aware and will call back after consultation with orthopaedic surgeon to fax message from Dr Wyline Mood - no recent episodes of SOB/CP - pt says he has been doing very well

## 2017-05-31 DIAGNOSIS — M170 Bilateral primary osteoarthritis of knee: Secondary | ICD-10-CM | POA: Diagnosis not present

## 2017-06-01 ENCOUNTER — Telehealth: Payer: Self-pay | Admitting: Cardiology

## 2017-06-01 NOTE — Telephone Encounter (Signed)
Mr. Katzenmeyer called requesting that a surgical clearance be faxed to [facility] Fax # [phone] office # [phone]

## 2017-06-01 NOTE — Telephone Encounter (Signed)
Left message to return call 

## 2017-06-01 NOTE — Telephone Encounter (Signed)
Patient notified that note from 05/12/2017 will be faxed along with clearance form today.

## 2017-06-14 DIAGNOSIS — G4733 Obstructive sleep apnea (adult) (pediatric): Secondary | ICD-10-CM | POA: Diagnosis not present

## 2017-06-22 ENCOUNTER — Ambulatory Visit (INDEPENDENT_AMBULATORY_CARE_PROVIDER_SITE_OTHER): Payer: Medicare HMO | Admitting: Cardiology

## 2017-06-22 ENCOUNTER — Encounter: Payer: Self-pay | Admitting: Cardiology

## 2017-06-22 VITALS — BP 164/88 | HR 58 | Ht 66.0 in | Wt 248.0 lb

## 2017-06-22 DIAGNOSIS — E782 Mixed hyperlipidemia: Secondary | ICD-10-CM | POA: Diagnosis not present

## 2017-06-22 DIAGNOSIS — I10 Essential (primary) hypertension: Secondary | ICD-10-CM

## 2017-06-22 DIAGNOSIS — I4891 Unspecified atrial fibrillation: Secondary | ICD-10-CM | POA: Diagnosis not present

## 2017-06-22 DIAGNOSIS — Z01810 Encounter for preprocedural cardiovascular examination: Secondary | ICD-10-CM

## 2017-06-22 DIAGNOSIS — Z23 Encounter for immunization: Secondary | ICD-10-CM | POA: Diagnosis not present

## 2017-06-22 DIAGNOSIS — I2510 Atherosclerotic heart disease of native coronary artery without angina pectoris: Secondary | ICD-10-CM

## 2017-06-22 MED ORDER — APIXABAN 5 MG PO TABS: 5.0000 mg | ORAL_TABLET | Freq: Two times a day (BID) | ORAL | 0 refills | Status: DC

## 2017-06-22 NOTE — Progress Notes (Signed)
Clinical Summary [name] [name] is a 66 y.o.male seen today for follow up of the followig medical problems.   1. HTN   - had gynecomastia on aldactone -Clinic numbers are often elevated.  - home bp's around 140s/80s early in AM prior to meds. Typically 130s/70s later in the day. In the evenings 120s-130s/60s-80s    2. CAD  - prior 2 vessel CABG in 2004 at [facility]  - apparently had follow up cath in 2006 from [place] with patent grafts. Reports negative stress test in 2011 by Dr Hyacinth Meeker in [place]. - 03/2015 Lexiscan MPI no ischemia - long history of chronic atypical muskoloskeltal chest pain   - denies any recent chest pain since last visit - working out at gym 3 days a week. Does handbike x 10 minutes without troubles. Pushmower x 20 minutes.   3. Hyperlipidemia  - noted some side effects on higher lipitor doses, has tolerated lower dose well  4. OSA  - compliant with CPAP  5. Afib - s/p ablation. He had failed rate control as well as multiple antiarrhythmics - no recent palpitations. No bleeding on eliquis  6. Preopertative evaluation - being considered for knee surgery   7. Diastolic dysfunction - grade II by recent US.  - no recent SOB/DOE/LE edema   Past Medical History:  Diagnosis Date  . Anxiety   . Chronic chest pain   . Chronic sinus bradycardia   . Coronary artery disease    2v CABG, 2004 [facility])  . DJD (degenerative joint disease)   . Edema   . HLD (hyperlipidemia)   . Hypertension   . MI, old 2004  . OA (osteoarthritis)   . Obesity   . PAF (paroxysmal atrial fibrillation) (HCC)    a. started on sotalol 08/2016.  Marland Kitchen Palpitations    Reported history of PVCs  . QT prolongation   . Sleep apnea    on CPAP  . Spinal stenosis   . Typical atrial flutter (HCC)      Allergies  Allergen Reactions  . Nsaids Other (See Comments)    On blood thinner  . Vancomycin Other (See Comments)    Was informed after heart  surgery ? Breathing problem/patient unsure     Current Outpatient Prescriptions  Medication Sig Dispense Refill  . acetaminophen (TYLENOL) 650 MG CR tablet Take 650 mg by mouth every 8 (eight) hours as needed for pain.     Marland Kitchen apixaban (ELIQUIS) 5 MG TABS tablet Take 1 tablet (5 mg total) by mouth 2 (two) times daily. 42 tablet 0  . atorvastatin (LIPITOR) 20 MG tablet Take 1 tablet (20 mg total) by mouth daily. 90 tablet 3  . chlorthalidone (HYGROTON) 25 MG tablet Take 1.5 tablets (37.5 mg total) by mouth daily. 135 tablet 3  . Coenzyme Q10 (CO Q 10) 100 MG CAPS Take 100 mg by mouth daily.     Marland Kitchen diltiazem (CARDIZEM CD) 360 MG 24 hr capsule Take 1 capsule (360 mg total) by mouth daily. 90 capsule 3  . fish oil-omega-3 fatty acids 1000 MG capsule Take 1 g by mouth 2 (two) times daily.     . Glucosamine-Chondroit-Vit C-Mn (GLUCOSAMINE 1500 COMPLEX) CAPS Take 2 capsules by mouth daily.    . irbesartan (AVAPRO) 300 MG tablet Take 1 tablet (300 mg total) by mouth at bedtime. 90 tablet 3  . metoprolol tartrate (LOPRESSOR) 25 MG tablet Take 1/2 tablet (12.5 mg) by mouth twice daily  AS NEEDED    .  Multiple Vitamin (MULTIVITAMIN) tablet Take 1 tablet by mouth daily.    Marland Kitchen omeprazole (PRILOSEC) 20 MG capsule Take 20 mg by mouth every other day.     . sertraline (ZOLOFT) 25 MG tablet Take 1 tablet (25 mg total) by mouth daily. 90 tablet 3   No current facility-administered medications for this visit.      Past Surgical History:  Procedure Laterality Date  . BACK SURGERY  2011  . CARPAL TUNNEL RELEASE     bilateral  . CORONARY ARTERY BYPASS GRAFT     2004  . ELECTROPHYSIOLOGIC STUDY [name] 09/15/2016   Procedure: Atrial Fibrillation Ablation;  Surgeon: Hillis Range, MD;  Location: [facility] INVASIVE CV LAB;  Service: Cardiovascular;  Laterality: [name];  . fistula surgery    . hemorrhoidectomy    . LUMBAR LAMINECTOMY    . right foot fracture    . TEE WITHOUT CARDIOVERSION [name] 09/15/2016   Procedure:  TRANSESOPHAGEAL ECHOCARDIOGRAM (TEE);  Surgeon: Lewayne Bunting, MD;  Location: [facility] ENDOSCOPY;  Service: Cardiovascular;  Laterality: [name];  . TRANSTHORACIC ECHOCARDIOGRAM  08/25/2010   EF 55-60%     Allergies  Allergen Reactions  . Nsaids Other (See Comments)    On blood thinner  . Vancomycin Other (See Comments)    Was informed after heart surgery ? Breathing problem/patient unsure      Family History  Problem Relation Age of Onset  . Breast cancer Mother   . Hypertension Mother   . Coronary artery disease Father   . Stroke Father   . Hypertension Father      Social History [name] [name] reports that he has never smoked. He has never used smokeless tobacco. [name] [name] reports that he does not drink alcohol.   Review of Systems CONSTITUTIONAL: No weight loss, fever, chills, weakness or fatigue.  HEENT: Eyes: No visual loss, blurred vision, double vision or yellow sclerae.No hearing loss, sneezing, congestion, runny nose or sore throat.  SKIN: No rash or itching.  CARDIOVASCULAR: per hpi RESPIRATORY: No shortness of breath, cough or sputum.  GASTROINTESTINAL: No anorexia, nausea, vomiting or diarrhea. No abdominal pain or blood.  GENITOURINARY: No burning on urination, no polyuria NEUROLOGICAL: No headache, dizziness, syncope, paralysis, ataxia, numbness or tingling in the extremities. No change in bowel or bladder control.  MUSCULOSKELETAL: No muscle, back pain, joint pain or stiffness.  LYMPHATICS: No enlarged nodes. No history of splenectomy.  PSYCHIATRIC: No history of depression or anxiety.  ENDOCRINOLOGIC: No reports of sweating, cold or heat intolerance. No polyuria or polydipsia.  Marland Kitchen   Physical Examination Vitals:   06/22/17 1053  BP: (!) 164/88  Pulse: (!) 58  SpO2: 97%   Vitals:   06/22/17 1053  Weight: 248 lb (112.5 kg)  Height: 5\' 6"  (1.676 m)    Gen: resting comfortably, no acute distress HEENT: no scleral icterus, pupils equal round and reactive,  no palptable cervical adenopathy,  CV: RRR, no /mr/g, no jvd Resp: Clear to auscultation bilaterally GI: abdomen is soft, non-tender, non-distended, normal bowel sounds, no hepatosplenomegaly MSK: extremities are warm, no edema.  Skin: warm, no rash Neuro:  no focal deficits Psych: appropriate affect   Diagnostic Studies 03/2015 echo Study Conclusions  - Left ventricle: The cavity size was normal. Wall thickness was increased in a pattern of mild LVH. Systolic function was normal. The estimated ejection fraction was in the range of 50% to 55%. Wall motion was normal; there were no regional wall motion abnormalities. Doppler parameters are consistent with high  ventricular filling pressure. - Left atrium: The atrium was mildly dilated.   03/2015 Lexiscan MPI  No T wave inversion was noted during stress.  There was no ST segment deviation noted during stress.  The study is normal.  This is a low risk study.  The left ventricular ejection fraction is normal (55-65%).  Nuclear stress EF: 61%.     Assessment and Plan  1. HTN - often with elevated clinic numbers - home numbers overall at goal. Some what anxious checking his home bp's frequently at home, I have asked him to check no more than 2-3 times a week.  - continue current meds  2. CAD  - no current symptoms  - he will conitnue current meds  3. Hyperlipidemia  - continue current statin, did not tolerate higher dose statin   4. OSA  - continue CPAP  5. Afib -doing well s/p ablation.  - no recent symptoms, conitnue current meds  6. Preoperative evaluation - tolerates greater than regularly without troubles .Fairly recent echo and stress were unremarkable - recommend proceeding with surgery, hold eliquis 2 days prior and start 1 day after    F/u 1 year  Antoine Poche, M.D.

## 2017-06-22 NOTE — Patient Instructions (Signed)

## 2017-06-25 NOTE — Progress Notes (Signed)
06-13-17 Cardiac clearance on chart from [name]  03-18-17 [facility]) EKG  09-10-16 (EP[facility]) ECHO  08-19-16 ([facility]) CXR

## 2017-06-28 NOTE — Patient Instructions (Addendum)
[name] [name]  06/28/2017   Your procedure is scheduled on: 07-07-17  Report to [facility] Main  Entrance Take [place]  Elevators to 3rd floor to  Short Stay Center at 9:00 AM.   Call this number if you have problems the morning of surgery [phone]   Remember: ONLY 1 PERSON MAY GO WITH YOU TO SHORT STAY TO GET  READY MORNING OF YOUR SURGERY.  Do not eat food or drink liquids :After Midnight.     Take these medicines the morning of surgery with A SIP OF WATER: Diltiazem (Cardizem), Omeprazole (Prilosec), and Sertraline (Zoloft)                                You may not have any metal on your body including hair pins and              piercings  Do not wear jewelry, make-up, lotions, powders or perfumes, deodorant             Men may shave face and neck.   Do not bring valuables to the hospital. [facility] IS NOT             RESPONSIBLE   FOR VALUABLES.  Contacts, dentures or bridgework may not be worn into surgery.  Leave suitcase in the car. After surgery it may be brought to your room.   Please bring your mask and tubing. PER YOUR MD INSTRUCTION, PLEASE STOP YOUR ELIQUIS 2 DAYS PRIOR TO SURGERY!                Please read over the following fact sheets you were given: _____________________________________________________________________             [facility] - Preparing for Surgery Before surgery, you can play an important role.  Because skin is not sterile, your skin needs to be as free of germs as possible.  You can reduce the number of germs on your skin by washing with CHG (chlorahexidine gluconate) soap before surgery.  CHG is an antiseptic cleaner which kills germs and bonds with the skin to continue killing germs even after washing. Please DO NOT use if you have an allergy to CHG or antibacterial soaps.  If your skin becomes reddened/irritated stop using the CHG and inform your nurse when you arrive at Short Stay. Do not shave (including legs  and underarms) for at least 48 hours prior to the first CHG shower.  You may shave your face/neck. Please follow these instructions carefully:  1.  Shower with CHG Soap the night before surgery and the  morning of Surgery.  2.  If you choose to wash your hair, wash your hair first as usual with your  normal  shampoo.  3.  After you shampoo, rinse your hair and body thoroughly to remove the  shampoo.                           4.  Use CHG as you would any other liquid soap.  You can apply chg directly  to the skin and wash                       Gently with a scrungie or clean washcloth.  5.  Apply the CHG Soap to your  body ONLY FROM THE NECK DOWN.   Do not use on face/ open                           Wound or open sores. Avoid contact with eyes, ears mouth and genitals (private parts).                       Wash face,  Genitals (private parts) with your normal soap.             6.  Wash thoroughly, paying special attention to the area where your surgery  will be performed.  7.  Thoroughly rinse your body with warm water from the neck down.  8.  DO NOT shower/wash with your normal soap after using and rinsing off  the CHG Soap.                9.  Pat yourself dry with a clean towel.            10.  Wear clean pajamas.            11.  Place clean sheets on your bed the night of your first shower and do not  sleep with pets. Day of Surgery : Do not apply any lotions/deodorants the morning of surgery.  Please wear clean clothes to the hospital/surgery center.  FAILURE TO FOLLOW THESE INSTRUCTIONS MAY RESULT IN THE CANCELLATION OF YOUR SURGERY PATIENT SIGNATURE_________________________________  NURSE SIGNATURE__________________________________  ________________________________________________________________________   Adam Phenix  An incentive spirometer is a tool that can help keep your lungs clear and active. This tool measures how well you are filling your lungs with each breath.  Taking long deep breaths may help reverse or decrease the chance of developing breathing (pulmonary) problems (especially infection) following:  A long period of time when you are unable to move or be active. BEFORE THE PROCEDURE   If the spirometer includes an indicator to show your best effort, your nurse or respiratory therapist will set it to a desired goal.  If possible, sit up straight or lean slightly forward. Try not to slouch.  Hold the incentive spirometer in an upright position. INSTRUCTIONS FOR USE  1. Sit on the edge of your bed if possible, or sit up as far as you can in bed or on a chair. 2. Hold the incentive spirometer in an upright position. 3. Breathe out normally. 4. Place the mouthpiece in your mouth and seal your lips tightly around it. 5. Breathe in slowly and as deeply as possible, raising the piston or the ball toward the top of the column. 6. Hold your breath for 3-5 seconds or for as long as possible. Allow the piston or ball to fall to the bottom of the column. 7. Remove the mouthpiece from your mouth and breathe out normally. 8. Rest for a few seconds and repeat Steps 1 through 7 at least 10 times every 1-2 hours when you are awake. Take your time and take a few normal breaths between deep breaths. 9. The spirometer may include an indicator to show your best effort. Use the indicator as a goal to work toward during each repetition. 10. After each set of 10 deep breaths, practice coughing to be sure your lungs are clear. If you have an incision (the cut made at the time of surgery), support your incision when coughing by placing a pillow or rolled up towels firmly against it.  Once you are able to get out of bed, walk around indoors and cough well. You may stop using the incentive spirometer when instructed by your caregiver.  RISKS AND COMPLICATIONS  Take your time so you do not get dizzy or light-headed.  If you are in pain, you may need to take or ask for pain  medication before doing incentive spirometry. It is harder to take a deep breath if you are having pain. AFTER USE  Rest and breathe slowly and easily.  It can be helpful to keep track of a log of your progress. Your caregiver can provide you with a simple table to help with this. If you are using the spirometer at home, follow these instructions: [place] IF:   You are having difficultly using the spirometer.  You have trouble using the spirometer as often as instructed.  Your pain medication is not giving enough relief while using the spirometer.  You develop fever of 100.5 F (38.1 C) or higher. SEEK IMMEDIATE MEDICAL CARE IF:   You cough up bloody sputum that had not been present before.  You develop fever of 102 F (38.9 C) or greater.  You develop worsening pain at or near the incision site. MAKE SURE YOU:   Understand these instructions.  Will watch your condition.  Will get help right away if you are not doing well or get worse. Document Released: 02/01/2007 Document Revised: 12/14/2011 Document Reviewed: 04/04/2007 Exit[employer] Patient Information 2014 [employer], [place].   ________________________________________________________________________  WHAT IS A BLOOD TRANSFUSION? Blood Transfusion Information  A transfusion is the replacement of blood or some of its parts. Blood is made up of multiple cells which provide different functions.  Red blood cells carry oxygen and are used for blood loss replacement.  White blood cells fight against infection.  Platelets control bleeding.  Plasma helps clot blood.  Other blood products are available for specialized needs, such as hemophilia or other clotting disorders. BEFORE THE TRANSFUSION  Who gives blood for transfusions?   Healthy volunteers who are fully evaluated to make sure their blood is safe. This is blood bank blood. Transfusion therapy is the safest it has ever been in the practice of medicine.  Before blood is taken from a donor, a complete history is taken to make sure that person has no history of diseases nor engages in risky social behavior (examples are intravenous drug use or sexual activity with multiple partners). The donor's travel history is screened to minimize risk of transmitting infections, such as malaria. The donated blood is tested for signs of infectious diseases, such as HIV and hepatitis. The blood is then tested to be sure it is compatible with you in order to minimize the chance of a transfusion reaction. If you or a relative donates blood, this is often done in anticipation of surgery and is not appropriate for emergency situations. It takes many days to process the donated blood. RISKS AND COMPLICATIONS Although transfusion therapy is very safe and saves many lives, the main dangers of transfusion include:   Getting an infectious disease.  Developing a transfusion reaction. This is an allergic reaction to something in the blood you were given. Every precaution is taken to prevent this. The decision to have a blood transfusion has been considered carefully by your caregiver before blood is given. Blood is not given unless the benefits outweigh the risks. AFTER THE TRANSFUSION  Right after receiving a blood transfusion, you will usually feel much better and more energetic.  This is especially true if your red blood cells have gotten low (anemic). The transfusion raises the level of the red blood cells which carry oxygen, and this usually causes an energy increase.  The nurse administering the transfusion will monitor you carefully for complications. HOME CARE INSTRUCTIONS  No special instructions are needed after a transfusion. You may find your energy is better. Speak with your caregiver about any limitations on activity for underlying diseases you may have. SEEK MEDICAL CARE IF:   Your condition is not improving after your transfusion.  You develop redness or  irritation at the intravenous (IV) site. SEEK IMMEDIATE MEDICAL CARE IF:  Any of the following symptoms occur over the next 12 hours:  Shaking chills.  You have a temperature by mouth above 102 F (38.9 C), not controlled by medicine.  Chest, back, or muscle pain.  People around you feel you are not acting correctly or are confused.  Shortness of breath or difficulty breathing.  Dizziness and fainting.  You get a rash or develop hives.  You have a decrease in urine output.  Your urine turns a dark color or changes to pink, red, or brown. Any of the following symptoms occur over the next 10 days:  You have a temperature by mouth above 102 F (38.9 C), not controlled by medicine.  Shortness of breath.  Weakness after normal activity.  The white part of the eye turns yellow (jaundice).  You have a decrease in the amount of urine or are urinating less often.  Your urine turns a dark color or changes to pink, red, or brown. Document Released: 09/18/2000 Document Revised: 12/14/2011 Document Reviewed: 05/07/2008 [facility] Patient Information 2014 [place], [place].  _______________________________________________________________________

## 2017-06-29 NOTE — H&P (Signed)
TOTAL KNEE ADMISSION H&P  Patient is being admitted for left total knee arthroplasty.  Subjective:  Chief Complaint:left knee pain.  HPI: [name] [name], 66 y.o. male, has a history of pain and functional disability in the left knee due to arthritis and has failed non-surgical conservative treatments for greater than 12 weeks to includeNSAID's and/or analgesics, corticosteriod injections, flexibility and strengthening excercises and activity modification.  Onset of symptoms was gradual, starting >10 years ago with gradually worsening course since that time. The patient noted no past surgery on the left knee(s).  Patient currently rates pain in the left knee(s) at 7 out of 10 with activity. Patient has night pain, worsening of pain with activity and weight bearing, pain that interferes with activities of daily living, pain with passive range of motion, crepitus and joint swelling.  Patient has evidence of periarticular osteophytes and joint space narrowing by imaging studies. There is no active infection.  Patient Active Problem List   Diagnosis Date Noted  . A-fib (HCC) 09/15/2016  . Chest pain 09/09/2016  . Obesity 08/22/2016  . Anxiety 08/22/2016  . Encounter for monitoring sotalol therapy 08/20/2016  . Paroxysmal atrial fibrillation (HCC) 08/19/2016  . Morbidly obese (HCC) 07/12/2016  . Medical non-compliance 07/12/2016  . Elevated TSH 07/09/2016  . BPH without urinary obstruction 07/09/2016  . Need for hepatitis C screening test 07/09/2016  . Idiopathic gout 07/09/2016  . Hyperglycemia 08/01/2015  . Atrial fibrillation with RVR (HCC) 03/28/2015  . Routine general medical examination at a health care facility 08/11/2013  . Chronic sinus bradycardia   . Obstructive sleep apnea 09/12/2010  . MYOCARDIAL INFARCTION 09/11/2010  . Spinal stenosis, unspecified region other than cervical 09/11/2010  . Hyperlipidemia with target LDL less than 70 09/08/2010  . Depression 09/08/2010  .  Essential hypertension 09/08/2010  . Coronary atherosclerosis 09/08/2010  . Congestive heart failure (HCC) 09/08/2010  . GERD 09/08/2010   Past Medical History:  Diagnosis Date  . Anxiety   . Chronic chest pain   . Chronic sinus bradycardia   . Coronary artery disease    2v CABG, 2004 [facility])  . DJD (degenerative joint disease)   . Edema   . HLD (hyperlipidemia)   . Hypertension   . MI, old 2004  . OA (osteoarthritis)   . Obesity   . PAF (paroxysmal atrial fibrillation) (HCC)    a. started on sotalol 08/2016.  Marland Kitchen Palpitations    Reported history of PVCs  . QT prolongation   . Sleep apnea    on CPAP  . Spinal stenosis   . Typical atrial flutter [facility])     Past Surgical History:  Procedure Laterality Date  . BACK SURGERY  2011  . CARPAL TUNNEL RELEASE     bilateral  . CORONARY ARTERY BYPASS GRAFT     2004  . ELECTROPHYSIOLOGIC STUDY [name] 09/15/2016   Procedure: Atrial Fibrillation Ablation;  Surgeon: Hillis Range, MD;  Location: [facility] INVASIVE CV LAB;  Service: Cardiovascular;  Laterality: [name];  . fistula surgery    . hemorrhoidectomy    . LUMBAR LAMINECTOMY    . right foot fracture    . TEE WITHOUT CARDIOVERSION [name] 09/15/2016   Procedure: TRANSESOPHAGEAL ECHOCARDIOGRAM (TEE);  Surgeon: Lewayne Bunting, MD;  Location: [facility] ENDOSCOPY;  Service: Cardiovascular;  Laterality: [name];  . TRANSTHORACIC ECHOCARDIOGRAM  08/25/2010   EF 55-60%     Current Outpatient Prescriptions:  .  acetaminophen (TYLENOL) 650 MG CR tablet, Take 650 mg by mouth every 8 (  eight) hours as needed for pain.  Marland Kitchen  apixaban (ELIQUIS) 5 MG TABS tablet, Take 1 tablet (5 mg total) by mouth 2 (two) times daily., Disp: 28 tablet, Rfl: 0 .  atorvastatin (LIPITOR) 20 MG tablet, Take 1 tablet (20 mg total) by mouth daily., Disp: 90 tablet, Rfl: 3 .  chlorthalidone (HYGROTON) 25 MG tablet, Take 37.5 mg by mouth daily., Disp: , Rfl:  .  Coenzyme Q10 (CO Q 10) 100 MG CAPS, Take 100 mg by mouth daily. , Disp: , Rfl:  .   diltiazem (CARDIZEM CD) 360 MG 24 hr capsule, Take 1 capsule (360 mg total) by mouth daily., Disp: 90 capsule,  .  fish oil-omega-3 fatty acids 1000 MG capsule, Take 1 g by mouth 2 (two) times daily. , Disp: , Rfl:  .  irbesartan (AVAPRO) 300 MG tablet, Take 1 tablet (300 mg total) by mouth at bedtime., Disp: 90 tablet, Rfl: 3 .  metoprolol tartrate (LOPRESSOR) 25 MG tablet, Take 1/2 tablet (12.5 mg) by mouth twice daily  AS NEEDED, Disp:  .  Multiple Vitamin (MULTIVITAMIN) tablet, Take 1 tablet by mouth daily., Disp: , Rfl:  .  omeprazole (PRILOSEC) 20 MG capsule, Take 20 mg by mouth every other day. , Disp: , Rfl:  .  PRESCRIPTION MEDICATION, Pt uses CPAP machine at bedtime, Disp: , Rfl:  .  sertraline (ZOLOFT) 25 MG tablet, Take 1 tablet (25 mg total) by mouth daily., Disp: 90 tablet, Rfl: 3  Allergies  Allergen Reactions  . Nsaids Other (See Comments)    On blood thinner  . Vancomycin Other (See Comments)    Was informed after heart surgery ? Breathing problem/patient unsure    Social History  Substance Use Topics  . Smoking status: Never Smoker  . Smokeless tobacco: Never Used  . Alcohol use No    Family History  Problem Relation Age of Onset  . Breast cancer Mother   . Hypertension Mother   . Coronary artery disease Father   . Stroke Father   . Hypertension Father      Review of Systems  Constitutional: Negative.   HENT: Positive for hearing loss.   Eyes: Negative.   Respiratory: Negative.   Cardiovascular: Negative.   Gastrointestinal: Negative.   Genitourinary: Negative.   Musculoskeletal: Positive for joint pain and myalgias. Negative for back pain, falls and neck pain.  Skin: Negative.   Neurological: Negative.   Endo/Heme/Allergies: Negative.   Psychiatric/Behavioral: Negative.     Objective:  Physical Exam  Constitutional: He is oriented to person, place, and time. He appears well-developed. No distress.  Morbidly obese  HENT:  Head: Normocephalic and  atraumatic.  Right Ear: External ear normal.  Left Ear: External ear normal.  Nose: Nose normal.  Mouth/Throat: Oropharynx is clear and moist.  Eyes: Conjunctivae and EOM are normal.  Neck: Normal range of motion. Neck supple.  Cardiovascular: Normal rate, regular rhythm, normal heart sounds and intact distal pulses.   No murmur heard. Respiratory: Effort normal and breath sounds normal. No respiratory distress. He has no wheezes.  GI: Soft. Bowel sounds are normal. He exhibits no distension. There is no tenderness.  Musculoskeletal:       Right hip: Normal.       Left hip: Normal.       Right knee: He exhibits decreased range of motion and swelling. He exhibits no effusion and no erythema. Tenderness found. Medial joint line and lateral joint line tenderness noted.  Left knee: He exhibits decreased range of motion and swelling. He exhibits no effusion and no erythema. Tenderness found. Medial joint line and lateral joint line tenderness noted.  Neurological: He is alert and oriented to person, place, and time. He has normal strength. No sensory deficit.  Skin: No rash noted. He is not diaphoretic. No erythema.  Psychiatric: He has a normal mood and affect. His behavior is normal.    Vitals  Weight: 245 lb Height: 66in Body Surface Area: 2.18 m Body Mass Index: 39.54 kg/m  Pulse: 72 (Regular)  BP: 168/84 (Sitting, Left Arm, Standard)  Imaging Review Plain radiographs demonstrate severe degenerative joint disease of the left knee(s). The overall alignment ismild varus. The bone quality appears to be good for age and reported activity level.  Assessment/Plan:  End stage primary osteoarthritis, left knee   The patient history, physical examination, clinical judgment of the provider and imaging studies are consistent with end stage degenerative joint disease of the left knee(s) and total knee arthroplasty is deemed medically necessary. The treatment options including  medical management, injection therapy arthroscopy and arthroplasty were discussed at length. The risks and benefits of total knee arthroplasty were presented and reviewed. The risks due to aseptic loosening, infection, stiffness, patella tracking problems, thromboembolic complications and other imponderables were discussed. The patient acknowledged the explanation, agreed to proceed with the plan and consent was signed. Patient is being admitted for inpatient treatment for surgery, pain control, PT, OT, prophylactic antibiotics, VTE prophylaxis, progressive ambulation and ADL's and discharge planning. The patient is planning to be discharged home with home health services   PCP/Cardio: [name] Pulm: [facility] DME: needs walker; has elevated commode seats Therapy Plans: HHPT then outpatient therapy starting 10/17 Home with wife Other concerns: avoid bolus of fluid due to history of CHF Topical TXA   Dimitri Ped, PA-C

## 2017-06-30 ENCOUNTER — Encounter (HOSPITAL_COMMUNITY): Payer: Self-pay

## 2017-06-30 ENCOUNTER — Encounter (HOSPITAL_COMMUNITY)
Admission: RE | Admit: 2017-06-30 | Discharge: 2017-06-30 | Disposition: A | Discharge status: Home / Self Care | Payer: Medicare HMO | Source: Ambulatory Visit | Attending: Orthopedic Surgery | Admitting: Orthopedic Surgery

## 2017-06-30 ENCOUNTER — Ambulatory Visit (HOSPITAL_COMMUNITY)
Admission: RE | Admit: 2017-06-30 | Discharge: 2017-06-30 | Disposition: A | Discharge status: Home / Self Care | Payer: Medicare HMO | Source: Ambulatory Visit | Attending: Surgical | Admitting: Surgical

## 2017-06-30 DIAGNOSIS — M1712 Unilateral primary osteoarthritis, left knee: Secondary | ICD-10-CM | POA: Insufficient documentation

## 2017-06-30 DIAGNOSIS — Z01818 Encounter for other preprocedural examination: Secondary | ICD-10-CM | POA: Diagnosis not present

## 2017-06-30 DIAGNOSIS — I700 Atherosclerosis of aorta: Secondary | ICD-10-CM | POA: Insufficient documentation

## 2017-06-30 LAB — SURGICAL PCR SCREEN
MRSA, PCR: NEGATIVE
STAPHYLOCOCCUS AUREUS: NEGATIVE

## 2017-06-30 LAB — APTT: aPTT: 34 seconds (ref 24–36)

## 2017-06-30 LAB — URINALYSIS, ROUTINE W REFLEX MICROSCOPIC
Bilirubin Urine: NEGATIVE
Glucose, UA: NEGATIVE mg/dL
Hgb urine dipstick: NEGATIVE
Ketones, ur: NEGATIVE mg/dL
Leukocytes, UA: NEGATIVE
Nitrite: NEGATIVE
Protein, ur: NEGATIVE mg/dL
Specific Gravity, Urine: 1.014 (ref 1.005–1.030)
pH: 7 (ref 5.0–8.0)

## 2017-06-30 LAB — CBC WITH DIFFERENTIAL/PLATELET
Basophils Absolute: 0 10*3/uL (ref 0.0–0.1)
Basophils Relative: 0 %
Eosinophils Absolute: 0.2 10*3/uL (ref 0.0–0.7)
Eosinophils Relative: 1 %
HCT: 43 % (ref 39.0–52.0)
Hemoglobin: 14.8 g/dL (ref 13.0–17.0)
Lymphocytes Relative: 20 %
Lymphs Abs: 2.2 10*3/uL (ref 0.7–4.0)
MCH: 29.5 pg (ref 26.0–34.0)
MCHC: 34.4 g/dL (ref 30.0–36.0)
MCV: 85.7 fL (ref 78.0–100.0)
Monocytes Absolute: 1.1 10*3/uL — ABNORMAL HIGH (ref 0.1–1.0)
Monocytes Relative: 10 %
Neutro Abs: 7.8 10*3/uL — ABNORMAL HIGH (ref 1.7–7.7)
Neutrophils Relative %: 69 %
Platelets: 308 10*3/uL (ref 150–400)
RBC: 5.02 MIL/uL (ref 4.22–5.81)
RDW: 13.6 % (ref 11.5–15.5)
WBC: 11.3 10*3/uL — ABNORMAL HIGH (ref 4.0–10.5)

## 2017-06-30 LAB — COMPREHENSIVE METABOLIC PANEL
ALT: 22 U/L (ref 17–63)
AST: 33 U/L (ref 15–41)
Albumin: 3.8 g/dL (ref 3.5–5.0)
Alkaline Phosphatase: 78 U/L (ref 38–126)
Anion gap: 10 (ref 5–15)
BUN: 23 mg/dL — ABNORMAL HIGH (ref 6–20)
CO2: 27 mmol/L (ref 22–32)
Calcium: 9.7 mg/dL (ref 8.9–10.3)
Chloride: 102 mmol/L (ref 101–111)
Creatinine, Ser: 1.15 mg/dL (ref 0.61–1.24)
GFR calc Af Amer: >60 mL/min (ref >60)
GFR calc non Af Amer: >60 mL/min (ref >60)
Glucose, Bld: 105 mg/dL — ABNORMAL HIGH (ref 65–99)
Potassium: 4.5 mmol/L (ref 3.5–5.1)
Sodium: 139 mmol/L (ref 135–145)
Total Bilirubin: 1.2 mg/dL (ref 0.3–1.2)
Total Protein: 8.1 g/dL (ref 6.5–8.1)

## 2017-06-30 LAB — PROTIME-INR
INR: 1.14
Prothrombin Time: 14.5 seconds (ref 11.4–15.2)

## 2017-06-30 LAB — ABO/RH: ABO/RH(D): O POS

## 2017-07-06 ENCOUNTER — Other Ambulatory Visit: Payer: Self-pay | Admitting: *Deleted

## 2017-07-06 MED ORDER — METOPROLOL TARTRATE 25 MG PO TABS: ORAL_TABLET | ORAL | 1 refills | Status: DC

## 2017-07-07 ENCOUNTER — Inpatient Hospital Stay (HOSPITAL_COMMUNITY)
Admission: RE | Admit: 2017-07-07 | Discharge: 2017-07-09 | DRG: 470 | Disposition: A | Discharge status: Home Health | Payer: Medicare HMO | Source: Ambulatory Visit | Attending: Orthopedic Surgery | Admitting: Orthopedic Surgery

## 2017-07-07 ENCOUNTER — Inpatient Hospital Stay (HOSPITAL_COMMUNITY): Payer: Medicare HMO | Admitting: Registered Nurse

## 2017-07-07 ENCOUNTER — Encounter (HOSPITAL_COMMUNITY): Payer: Self-pay | Admitting: *Deleted

## 2017-07-07 ENCOUNTER — Encounter (HOSPITAL_COMMUNITY)
Admission: RE | Disposition: A | Discharge status: Home Health | Payer: Self-pay | Source: Ambulatory Visit | Attending: Orthopedic Surgery

## 2017-07-07 ENCOUNTER — Inpatient Hospital Stay (HOSPITAL_COMMUNITY): Payer: Medicare HMO

## 2017-07-07 DIAGNOSIS — Z471 Aftercare following joint replacement surgery: Secondary | ICD-10-CM | POA: Diagnosis not present

## 2017-07-07 DIAGNOSIS — Z7901 Long term (current) use of anticoagulants: Secondary | ICD-10-CM | POA: Diagnosis not present

## 2017-07-07 DIAGNOSIS — Z79899 Other long term (current) drug therapy: Secondary | ICD-10-CM

## 2017-07-07 DIAGNOSIS — G8918 Other acute postprocedural pain: Secondary | ICD-10-CM | POA: Diagnosis not present

## 2017-07-07 DIAGNOSIS — F419 Anxiety disorder, unspecified: Secondary | ICD-10-CM | POA: Diagnosis present

## 2017-07-07 DIAGNOSIS — I252 Old myocardial infarction: Secondary | ICD-10-CM | POA: Diagnosis not present

## 2017-07-07 DIAGNOSIS — M24562 Contracture, left knee: Secondary | ICD-10-CM | POA: Diagnosis not present

## 2017-07-07 DIAGNOSIS — I483 Typical atrial flutter: Secondary | ICD-10-CM | POA: Diagnosis not present

## 2017-07-07 DIAGNOSIS — G8929 Other chronic pain: Secondary | ICD-10-CM | POA: Diagnosis present

## 2017-07-07 DIAGNOSIS — Z9119 Patient's noncompliance with other medical treatment and regimen: Secondary | ICD-10-CM

## 2017-07-07 DIAGNOSIS — K219 Gastro-esophageal reflux disease without esophagitis: Secondary | ICD-10-CM | POA: Diagnosis present

## 2017-07-07 DIAGNOSIS — Z96652 Presence of left artificial knee joint: Secondary | ICD-10-CM

## 2017-07-07 DIAGNOSIS — Z951 Presence of aortocoronary bypass graft: Secondary | ICD-10-CM | POA: Diagnosis not present

## 2017-07-07 DIAGNOSIS — N400 Enlarged prostate without lower urinary tract symptoms: Secondary | ICD-10-CM | POA: Diagnosis present

## 2017-07-07 DIAGNOSIS — E785 Hyperlipidemia, unspecified: Secondary | ICD-10-CM | POA: Diagnosis present

## 2017-07-07 DIAGNOSIS — G4733 Obstructive sleep apnea (adult) (pediatric): Secondary | ICD-10-CM | POA: Diagnosis present

## 2017-07-07 DIAGNOSIS — I2510 Atherosclerotic heart disease of native coronary artery without angina pectoris: Secondary | ICD-10-CM | POA: Diagnosis present

## 2017-07-07 DIAGNOSIS — M25762 Osteophyte, left knee: Secondary | ICD-10-CM | POA: Diagnosis present

## 2017-07-07 DIAGNOSIS — F329 Major depressive disorder, single episode, unspecified: Secondary | ICD-10-CM | POA: Diagnosis present

## 2017-07-07 DIAGNOSIS — Z881 Allergy status to other antibiotic agents status: Secondary | ICD-10-CM

## 2017-07-07 DIAGNOSIS — H9190 Unspecified hearing loss, unspecified ear: Secondary | ICD-10-CM | POA: Diagnosis present

## 2017-07-07 DIAGNOSIS — M1712 Unilateral primary osteoarthritis, left knee: Secondary | ICD-10-CM | POA: Diagnosis not present

## 2017-07-07 DIAGNOSIS — Z886 Allergy status to analgesic agent status: Secondary | ICD-10-CM | POA: Diagnosis not present

## 2017-07-07 DIAGNOSIS — I110 Hypertensive heart disease with heart failure: Secondary | ICD-10-CM | POA: Diagnosis present

## 2017-07-07 DIAGNOSIS — I480 Paroxysmal atrial fibrillation: Secondary | ICD-10-CM | POA: Diagnosis not present

## 2017-07-07 DIAGNOSIS — M1000 Idiopathic gout, unspecified site: Secondary | ICD-10-CM | POA: Diagnosis present

## 2017-07-07 DIAGNOSIS — Z6839 Body mass index (BMI) 39.0-39.9, adult: Secondary | ICD-10-CM

## 2017-07-07 DIAGNOSIS — Z8249 Family history of ischemic heart disease and other diseases of the circulatory system: Secondary | ICD-10-CM

## 2017-07-07 DIAGNOSIS — Z419 Encounter for procedure for purposes other than remedying health state, unspecified: Secondary | ICD-10-CM

## 2017-07-07 DIAGNOSIS — M25562 Pain in left knee: Secondary | ICD-10-CM | POA: Diagnosis present

## 2017-07-07 DIAGNOSIS — R69 Illness, unspecified: Secondary | ICD-10-CM | POA: Diagnosis not present

## 2017-07-07 DIAGNOSIS — I509 Heart failure, unspecified: Secondary | ICD-10-CM | POA: Diagnosis present

## 2017-07-07 HISTORY — PX: TOTAL KNEE ARTHROPLASTY: SHX125

## 2017-07-07 LAB — TYPE AND SCREEN
ABO/RH(D): O POS
Antibody Screen: NEGATIVE

## 2017-07-07 SURGERY — ARTHROPLASTY, KNEE, TOTAL
Anesthesia: Spinal | Site: Knee | Laterality: Left

## 2017-07-07 MED ORDER — HYDROMORPHONE HCL-NACL 0.5-0.9 MG/ML-% IV SOSY: 0.2500 mg | PREFILLED_SYRINGE | INTRAVENOUS | Status: DC | PRN

## 2017-07-07 MED ORDER — METHOCARBAMOL 1000 MG/10ML IJ SOLN
500.0000 mg | Freq: Four times a day (QID) | INTRAVENOUS | Status: DC | PRN
  Administered 2017-07-07: 500 mg via INTRAVENOUS
  Filled 2017-07-07: qty 550

## 2017-07-07 MED ORDER — MENTHOL 3 MG MT LOZG: 1.0000 | LOZENGE | OROMUCOSAL | Status: DC | PRN

## 2017-07-07 MED ORDER — SODIUM CHLORIDE 0.9 % IR SOLN
Status: DC | PRN
Start: 2017-07-07 — End: 2017-07-07
  Administered 2017-07-07: 1000 mL

## 2017-07-07 MED ORDER — TRANEXAMIC ACID 1000 MG/10ML IV SOLN: 1000.0000 mg | INTRAVENOUS | Status: DC

## 2017-07-07 MED ORDER — TRANEXAMIC ACID 1000 MG/10ML IV SOLN
2000.0000 mg | Freq: Once | INTRAVENOUS | Status: DC
  Filled 2017-07-07: qty 20

## 2017-07-07 MED ORDER — PROPOFOL 10 MG/ML IV BOLUS
INTRAVENOUS | Status: AC
  Filled 2017-07-07: qty 20

## 2017-07-07 MED ORDER — SODIUM CHLORIDE 0.9 % IV SOLN
INTRAVENOUS | Status: DC | PRN
  Administered 2017-07-07: 500 mL

## 2017-07-07 MED ORDER — PHENYLEPHRINE 40 MCG/ML (10ML) SYRINGE FOR IV PUSH (FOR BLOOD PRESSURE SUPPORT)
PREFILLED_SYRINGE | INTRAVENOUS | Status: DC | PRN
  Administered 2017-07-07: 80 ug via INTRAVENOUS

## 2017-07-07 MED ORDER — FERROUS SULFATE 325 (65 FE) MG PO TABS
325.0000 mg | ORAL_TABLET | Freq: Three times a day (TID) | ORAL | Status: DC
  Administered 2017-07-08 – 2017-07-09 (×3): 325 mg via ORAL
  Filled 2017-07-07 (×3): qty 1

## 2017-07-07 MED ORDER — SODIUM CHLORIDE 0.9 % IV SOLN
INTRAVENOUS | Status: AC
  Filled 2017-07-07: qty 500000

## 2017-07-07 MED ORDER — ACETAMINOPHEN 10 MG/ML IV SOLN
INTRAVENOUS | Status: AC
  Filled 2017-07-07: qty 100

## 2017-07-07 MED ORDER — CHLORHEXIDINE GLUCONATE 4 % EX LIQD: 60.0000 mL | Freq: Once | CUTANEOUS | Status: DC

## 2017-07-07 MED ORDER — OXYCODONE HCL 5 MG/5ML PO SOLN: 5.0000 mg | Freq: Once | ORAL | Status: DC | PRN

## 2017-07-07 MED ORDER — MIDAZOLAM HCL 2 MG/2ML IJ SOLN
2.0000 mg | Freq: Once | INTRAMUSCULAR | Status: AC
  Administered 2017-07-07: 2 mg via INTRAVENOUS

## 2017-07-07 MED ORDER — ROPIVACAINE HCL 5 MG/ML IJ SOLN
INTRAMUSCULAR | Status: DC | PRN
  Administered 2017-07-07: 20 mL via PERINEURAL

## 2017-07-07 MED ORDER — ACETAMINOPHEN 325 MG PO TABS: 650.0000 mg | ORAL_TABLET | Freq: Four times a day (QID) | ORAL | Status: DC | PRN

## 2017-07-07 MED ORDER — ONDANSETRON HCL 4 MG PO TABS: 4.0000 mg | ORAL_TABLET | Freq: Four times a day (QID) | ORAL | Status: DC | PRN

## 2017-07-07 MED ORDER — PROPOFOL 10 MG/ML IV BOLUS
INTRAVENOUS | Status: AC
  Filled 2017-07-07: qty 40

## 2017-07-07 MED ORDER — MIDAZOLAM HCL 2 MG/2ML IJ SOLN
INTRAMUSCULAR | Status: AC
Start: 2017-07-07 — End: 2017-07-07
  Administered 2017-07-07: 2 mg via INTRAVENOUS
  Filled 2017-07-07: qty 2

## 2017-07-07 MED ORDER — SODIUM CHLORIDE 0.9 % IJ SOLN
INTRAMUSCULAR | Status: DC | PRN
  Administered 2017-07-07: 20 mL

## 2017-07-07 MED ORDER — EPHEDRINE 5 MG/ML INJ
INTRAVENOUS | Status: AC
  Filled 2017-07-07: qty 10

## 2017-07-07 MED ORDER — ONDANSETRON HCL 4 MG/2ML IJ SOLN
4.0000 mg | Freq: Four times a day (QID) | INTRAMUSCULAR | Status: DC | PRN
  Administered 2017-07-07: 4 mg via INTRAVENOUS
  Filled 2017-07-07: qty 2

## 2017-07-07 MED ORDER — BISACODYL 5 MG PO TBEC: 5.0000 mg | DELAYED_RELEASE_TABLET | Freq: Every day | ORAL | Status: DC | PRN

## 2017-07-07 MED ORDER — DEXAMETHASONE SODIUM PHOSPHATE 10 MG/ML IJ SOLN
INTRAMUSCULAR | Status: DC | PRN
  Administered 2017-07-07: 10 mg via INTRAVENOUS

## 2017-07-07 MED ORDER — LIDOCAINE 2% (20 MG/ML) 5 ML SYRINGE
INTRAMUSCULAR | Status: DC | PRN
  Administered 2017-07-07: 40 mg via INTRAVENOUS

## 2017-07-07 MED ORDER — LACTATED RINGERS IV SOLN
INTRAVENOUS | Status: DC
  Administered 2017-07-07: 17:00:00 via INTRAVENOUS

## 2017-07-07 MED ORDER — OXYCODONE HCL 5 MG PO TABS: 5.0000 mg | ORAL_TABLET | Freq: Once | ORAL | Status: DC | PRN

## 2017-07-07 MED ORDER — PHENYLEPHRINE 40 MCG/ML (10ML) SYRINGE FOR IV PUSH (FOR BLOOD PRESSURE SUPPORT)
PREFILLED_SYRINGE | INTRAVENOUS | Status: AC
  Filled 2017-07-07: qty 20

## 2017-07-07 MED ORDER — FENTANYL CITRATE (PF) 100 MCG/2ML IJ SOLN
100.0000 ug | Freq: Once | INTRAMUSCULAR | Status: AC
  Administered 2017-07-07: 100 ug via INTRAVENOUS

## 2017-07-07 MED ORDER — LACTATED RINGERS IV SOLN
INTRAVENOUS | Status: DC
  Administered 2017-07-07 (×2): via INTRAVENOUS

## 2017-07-07 MED ORDER — ONDANSETRON HCL 4 MG/2ML IJ SOLN
INTRAMUSCULAR | Status: AC
  Filled 2017-07-07: qty 2

## 2017-07-07 MED ORDER — HYDROCODONE-ACETAMINOPHEN 5-325 MG PO TABS
1.0000 | ORAL_TABLET | ORAL | Status: DC | PRN
  Administered 2017-07-07 – 2017-07-08 (×4): 2 via ORAL
  Filled 2017-07-07 (×4): qty 2

## 2017-07-07 MED ORDER — FENTANYL CITRATE (PF) 100 MCG/2ML IJ SOLN
INTRAMUSCULAR | Status: AC
  Administered 2017-07-07: 100 ug via INTRAVENOUS
  Filled 2017-07-07: qty 2

## 2017-07-07 MED ORDER — CEFAZOLIN SODIUM-DEXTROSE 2-4 GM/100ML-% IV SOLN
2.0000 g | INTRAVENOUS | Status: AC
  Administered 2017-07-07: 2 g via INTRAVENOUS
  Filled 2017-07-07: qty 100

## 2017-07-07 MED ORDER — OXYCODONE-ACETAMINOPHEN 5-325 MG PO TABS
2.0000 | ORAL_TABLET | ORAL | Status: DC | PRN
  Administered 2017-07-08 – 2017-07-09 (×6): 2 via ORAL
  Filled 2017-07-07 (×6): qty 2

## 2017-07-07 MED ORDER — LIDOCAINE 2% (20 MG/ML) 5 ML SYRINGE
INTRAMUSCULAR | Status: AC
  Filled 2017-07-07: qty 5

## 2017-07-07 MED ORDER — ALUM & MAG HYDROXIDE-SIMETH 200-200-20 MG/5ML PO SUSP: 30.0000 mL | ORAL | Status: DC | PRN

## 2017-07-07 MED ORDER — PROPOFOL 10 MG/ML IV BOLUS
INTRAVENOUS | Status: DC | PRN
Start: 2017-07-07 — End: 2017-07-07
  Administered 2017-07-07: 20 mg via INTRAVENOUS

## 2017-07-07 MED ORDER — ACETAMINOPHEN 650 MG RE SUPP: 650.0000 mg | Freq: Four times a day (QID) | RECTAL | Status: DC | PRN

## 2017-07-07 MED ORDER — DEXAMETHASONE SODIUM PHOSPHATE 10 MG/ML IJ SOLN
INTRAMUSCULAR | Status: AC
  Filled 2017-07-07: qty 1

## 2017-07-07 MED ORDER — ONDANSETRON HCL 4 MG/2ML IJ SOLN
INTRAMUSCULAR | Status: DC | PRN
  Administered 2017-07-07: 4 mg via INTRAVENOUS

## 2017-07-07 MED ORDER — SODIUM CHLORIDE 0.9 % IJ SOLN
INTRAMUSCULAR | Status: AC
  Filled 2017-07-07: qty 20

## 2017-07-07 MED ORDER — APIXABAN 2.5 MG PO TABS
2.5000 mg | ORAL_TABLET | Freq: Two times a day (BID) | ORAL | Status: DC
  Administered 2017-07-08 – 2017-07-09 (×3): 2.5 mg via ORAL
  Filled 2017-07-07 (×3): qty 1

## 2017-07-07 MED ORDER — PHENOL 1.4 % MT LIQD: 1.0000 | OROMUCOSAL | Status: DC | PRN

## 2017-07-07 MED ORDER — PROMETHAZINE HCL 25 MG/ML IJ SOLN: 6.2500 mg | INTRAMUSCULAR | Status: DC | PRN

## 2017-07-07 MED ORDER — ACETAMINOPHEN 10 MG/ML IV SOLN
1000.0000 mg | Freq: Once | INTRAVENOUS | Status: AC
  Administered 2017-07-07: 1000 mg via INTRAVENOUS

## 2017-07-07 MED ORDER — BUPIVACAINE HCL (PF) 0.25 % IJ SOLN
INTRAMUSCULAR | Status: AC
  Filled 2017-07-07: qty 30

## 2017-07-07 MED ORDER — BUPIVACAINE LIPOSOME 1.3 % IJ SUSP
INTRAMUSCULAR | Status: DC | PRN
  Administered 2017-07-07: 20 mL

## 2017-07-07 MED ORDER — BUPIVACAINE IN DEXTROSE 0.75-8.25 % IT SOLN
INTRATHECAL | Status: DC | PRN
  Administered 2017-07-07: 2 mL via INTRATHECAL

## 2017-07-07 MED ORDER — FLEET ENEMA 7-19 GM/118ML RE ENEM: 1.0000 | ENEMA | Freq: Once | RECTAL | Status: DC | PRN

## 2017-07-07 MED ORDER — PROPOFOL 500 MG/50ML IV EMUL
INTRAVENOUS | Status: DC | PRN
  Administered 2017-07-07: 75 ug/kg/min via INTRAVENOUS

## 2017-07-07 MED ORDER — METHOCARBAMOL 500 MG PO TABS
500.0000 mg | ORAL_TABLET | Freq: Four times a day (QID) | ORAL | Status: DC | PRN
  Administered 2017-07-07 – 2017-07-08 (×3): 500 mg via ORAL
  Filled 2017-07-07 (×3): qty 1

## 2017-07-07 MED ORDER — BUPIVACAINE LIPOSOME 1.3 % IJ SUSP
20.0000 mL | Freq: Once | INTRAMUSCULAR | Status: DC
  Filled 2017-07-07 (×2): qty 20

## 2017-07-07 MED ORDER — CEFAZOLIN SODIUM-DEXTROSE 1-4 GM/50ML-% IV SOLN
1.0000 g | Freq: Four times a day (QID) | INTRAVENOUS | Status: AC
  Administered 2017-07-07 – 2017-07-08 (×2): 1 g via INTRAVENOUS
  Filled 2017-07-07 (×2): qty 50

## 2017-07-07 MED ORDER — HYDROMORPHONE HCL-NACL 0.5-0.9 MG/ML-% IV SOSY
0.5000 mg | PREFILLED_SYRINGE | INTRAVENOUS | Status: DC | PRN
  Administered 2017-07-07 – 2017-07-08 (×5): 0.5 mg via INTRAVENOUS
  Filled 2017-07-07 (×5): qty 1

## 2017-07-07 MED ORDER — POLYETHYLENE GLYCOL 3350 17 G PO PACK: 17.0000 g | PACK | Freq: Every day | ORAL | Status: DC | PRN

## 2017-07-07 SURGICAL SUPPLY — 63 items
BAG DECANTER FOR FLEXI CONT (MISCELLANEOUS) ×2 IMPLANT
BAG ZIPLOCK 12X15 (MISCELLANEOUS) IMPLANT
BANDAGE ACE 4X5 VEL STRL LF (GAUZE/BANDAGES/DRESSINGS) ×2 IMPLANT
BANDAGE ACE 6X5 VEL STRL LF (GAUZE/BANDAGES/DRESSINGS) ×2 IMPLANT
BLADE SAG 18X100X1.27 (BLADE) ×2 IMPLANT
BLADE SAW SGTL 11.0X1.19X90.0M (BLADE) ×2 IMPLANT
BNDG COHESIVE 4X5 TAN STRL (GAUZE/BANDAGES/DRESSINGS) ×2 IMPLANT
BNDG GAUZE ELAST 4 BULKY (GAUZE/BANDAGES/DRESSINGS) ×4 IMPLANT
BONE CEMENT GENTAMICIN (Cement) ×4 IMPLANT
CAP KNEE TOTAL 3 SIGMA ×2 IMPLANT
CEMENT BONE GENTAMICIN 40 (Cement) ×2 IMPLANT
COVER SURGICAL LIGHT HANDLE (MISCELLANEOUS) ×2 IMPLANT
CUFF TOURN SGL QUICK 34 (TOURNIQUET CUFF) ×1
CUFF TRNQT CYL 34X4X40X1 (TOURNIQUET CUFF) ×1 IMPLANT
DECANTER SPIKE VIAL GLASS SM (MISCELLANEOUS) ×2 IMPLANT
DRAPE INCISE IOBAN 66X45 STRL (DRAPES) ×2 IMPLANT
DRAPE U-SHAPE 47X51 STRL (DRAPES) ×2 IMPLANT
DRSG ADAPTIC 3X8 NADH LF (GAUZE/BANDAGES/DRESSINGS) ×2 IMPLANT
DRSG PAD ABDOMINAL 8X10 ST (GAUZE/BANDAGES/DRESSINGS) ×4 IMPLANT
DURAPREP 26ML APPLICATOR (WOUND CARE) ×2 IMPLANT
ELECT BLADE TIP CTD 4 INCH (ELECTRODE) ×2 IMPLANT
ELECT REM PT RETURN 15FT ADLT (MISCELLANEOUS) ×2 IMPLANT
EVACUATOR 1/8 PVC DRAIN (DRAIN) ×2 IMPLANT
FACESHIELD WRAPAROUND (MASK) ×2 IMPLANT
GAUZE SPONGE 4X4 12PLY STRL (GAUZE/BANDAGES/DRESSINGS) ×2 IMPLANT
GLOVE BIOGEL PI IND STRL 6.5 (GLOVE) ×1 IMPLANT
GLOVE BIOGEL PI IND STRL 8.5 (GLOVE) ×1 IMPLANT
GLOVE BIOGEL PI INDICATOR 6.5 (GLOVE) ×1
GLOVE BIOGEL PI INDICATOR 8.5 (GLOVE) ×1
GLOVE ECLIPSE 8.0 STRL XLNG CF (GLOVE) ×4 IMPLANT
GLOVE SURG SS PI 6.5 STRL IVOR (GLOVE) ×2 IMPLANT
GOWN STRL REUS W/TWL LRG LVL3 (GOWN DISPOSABLE) ×2 IMPLANT
GOWN STRL REUS W/TWL XL LVL3 (GOWN DISPOSABLE) ×2 IMPLANT
HANDPIECE INTERPULSE COAX TIP (DISPOSABLE) ×1
HEMOSTAT SPONGE AVITENE ULTRA (HEMOSTASIS) ×6 IMPLANT
IMMOBILIZER KNEE 20 (SOFTGOODS) ×2
IMMOBILIZER KNEE 20 THIGH 36 (SOFTGOODS) ×1 IMPLANT
MANIFOLD NEPTUNE II (INSTRUMENTS) ×2 IMPLANT
NEEDLE HYPO 21X1.5 SAFETY (NEEDLE) ×2 IMPLANT
NEEDLE HYPO 22GX1.5 SAFETY (NEEDLE) ×2 IMPLANT
NS IRRIG 1000ML POUR BTL (IV SOLUTION) ×2 IMPLANT
PACK TOTAL KNEE CUSTOM (KITS) ×2 IMPLANT
PAD ABD 8X10 STRL (GAUZE/BANDAGES/DRESSINGS) ×4 IMPLANT
PADDING CAST COTTON 6X4 STRL (CAST SUPPLIES) ×2 IMPLANT
PENCIL SMOKE EVAC W/HOLSTER (ELECTROSURGICAL) ×2 IMPLANT
POSITIONER SURGICAL ARM (MISCELLANEOUS) ×2 IMPLANT
SET HNDPC FAN SPRY TIP SCT (DISPOSABLE) ×1 IMPLANT
SET PAD KNEE POSITIONER (MISCELLANEOUS) ×2 IMPLANT
SPONGE LAP 18X18 X RAY DECT (DISPOSABLE) ×2 IMPLANT
STRIP CLOSURE SKIN 1/2X4 (GAUZE/BANDAGES/DRESSINGS) ×2 IMPLANT
SUT BONE WAX W31G (SUTURE) IMPLANT
SUT MNCRL AB 4-0 PS2 18 (SUTURE) ×2 IMPLANT
SUT VIC AB 1 CT1 27 (SUTURE) ×2
SUT VIC AB 1 CT1 27XBRD ANTBC (SUTURE) ×2 IMPLANT
SUT VIC AB 2-0 CT1 27 (SUTURE) ×3
SUT VIC AB 2-0 CT1 TAPERPNT 27 (SUTURE) ×3 IMPLANT
SUT VLOC 180 0 24IN GS25 (SUTURE) ×2 IMPLANT
SYR 20CC LL (SYRINGE) ×4 IMPLANT
TOWER CARTRIDGE SMART MIX (DISPOSABLE) ×2 IMPLANT
TRAY FOLEY W/METER SILVER 16FR (SET/KITS/TRAYS/PACK) ×2 IMPLANT
WATER STERILE IRR 1500ML POUR (IV SOLUTION) ×2 IMPLANT
WRAP KNEE MAXI GEL POST OP (GAUZE/BANDAGES/DRESSINGS) ×2 IMPLANT
YANKAUER SUCT BULB TIP 10FT TU (MISCELLANEOUS) ×2 IMPLANT

## 2017-07-07 NOTE — Transfer of Care (Addendum)
Immediate Anesthesia Transfer of Care Note  Patient: [name] [name]  Procedure(s) Performed: Procedure(s): LEFT TOTAL KNEE ARTHROPLASTY (Left)  Patient Location: PACU  Anesthesia Type:SAB  Level of Consciousness:  sedated, patient cooperative and responds to stimulation  Airway & Oxygen Therapy:Patient Spontanous Breathing and Patient connected to face mask oxgen  Post-op Assessment:  Report given to PACU RN and Post -op Vital signs reviewed and stable  Post vital signs:  Reviewed and stable  Last Vitals:  Vitals:   07/07/17 1100 07/07/17 1103  BP:  140/78  Pulse: (!) 53 (!) 52  Resp: 10 14  Temp:    SpO2: 94% 97%    Complications: No apparent anesthesia complications

## 2017-07-07 NOTE — Anesthesia Preprocedure Evaluation (Signed)
Anesthesia Evaluation  Patient identified by MRN, date of birth, ID band Patient awake    Reviewed: Allergy & Precautions, NPO status , Patient's Chart, lab work & pertinent test results, reviewed documented beta blocker date and time   Airway Mallampati: II  TM Distance: >3 FB Neck ROM: Full    Dental no notable dental hx.    Pulmonary sleep apnea and Continuous Positive Airway Pressure Ventilation ,    Pulmonary exam normal breath sounds clear to auscultation       Cardiovascular hypertension, Pt. on medications and Pt. on home beta blockers + CAD, + Past MI and +CHF  Normal cardiovascular exam Rhythm:Regular Rate:Normal     Neuro/Psych PSYCHIATRIC DISORDERS Anxiety Depression negative neurological ROS     GI/Hepatic Neg liver ROS, GERD  ,  Endo/Other  Morbid obesity  Renal/GU negative Renal ROS     Musculoskeletal  (+) Arthritis ,   Abdominal   Peds  Hematology negative hematology ROS (+)   Anesthesia Other Findings   Reproductive/Obstetrics negative OB ROS                             Anesthesia Physical  Anesthesia Plan  ASA: III  Anesthesia Plan: Spinal   Post-op Pain Management:  Regional for Post-op pain   Induction: Intravenous  PONV Risk Score and Plan: 1 and Ondansetron and Midazolam  Airway Management Planned: Simple Face Mask  Additional Equipment: None  Intra-op Plan:   Post-operative Plan:   Informed Consent: I have reviewed the patients History and Physical, chart, labs and discussed the procedure including the risks, benefits and alternatives for the proposed anesthesia with the patient or authorized representative who has indicated his/her understanding and acceptance.   Dental advisory given  Plan Discussed with: CRNA  Anesthesia Plan Comments:         Anesthesia Quick Evaluation

## 2017-07-07 NOTE — Interval H&P Note (Signed)
History and Physical Interval Note:  07/07/2017 10:36 AM  [name] [name]  has presented today for surgery, with the diagnosis of Left knee osteoarthritis   The various methods of treatment have been discussed with the patient and family. After consideration of risks, benefits and other options for treatment, the patient has consented to  Procedure(s): LEFT TOTAL KNEE ARTHROPLASTY (Left) as a surgical intervention .  The patient's history has been reviewed, patient examined, no change in status, stable for surgery.  I have reviewed the patient's chart and labs.  Questions were answered to the patient's satisfaction.     [name]

## 2017-07-07 NOTE — Progress Notes (Signed)
Assisted Dr. Miller with left, ultrasound guided, transabdominal plane block. Side rails up, monitors on throughout procedure. See vital signs in flow sheet. Tolerated Procedure well. 

## 2017-07-07 NOTE — Brief Op Note (Signed)
07/07/2017  1:17 PM  PATIENT:  [name] [name]  66 y.o. male  PRE-OPERATIVE DIAGNOSIS:  Left kneePrimary osteoarthritis with Flexion Contractures.  POST-OPERATIVE DIAGNOSIS:  Left knee Primary  Osteoarthritis with Flexion Contractures.   PROCEDURE:  Procedure(s): LEFT TOTAL KNEE ARTHROPLASTY (Left)  SURGEON:  Surgeon(s) and Role:    Ranee Gosselin, MD - Primary  PHYSICIAN ASSISTANT: Dimitri Ped PA  ASSISTANTS: Dimitri Ped PA ANESTHESIA:   spinal and Adductor Block.  EBL:  Total I/O In: 1000 [I.V.:1000] Out: 300 [Urine:300]  BLOOD ADMINISTERED:none  DRAINS: (One) Hemovact drain(s) in the Left Knee with  Suction Open   LOCAL MEDICATIONS USED:  OTHER 20cc of Exparel mixed with 20cc of Normal Saline.  SPECIMEN:  No Specimen  DISPOSITION OF SPECIMEN:  [name]  COUNTS:  YES  TOURNIQUET:  * Missing tourniquet times found for documented tourniquets in log:  [id number] *  DICTATION: .Other Dictation: Dictation Number [id number]  PLAN OF CARE: Admit to inpatient   PATIENT DISPOSITION:  Stable in OR   Delay start of Pharmacological VTE agent (>24hrs) due to surgical blood loss or risk of bleeding: yes

## 2017-07-07 NOTE — Anesthesia Procedure Notes (Signed)
Spinal  Patient location during procedure: OR Start time: 07/07/2017 11:20 AM End time: 07/07/2017 11:25 AM Staffing Anesthesiologist: Anitra Lauth RAY Performed: anesthesiologist  Preanesthetic Checklist Completed: patient identified, site marked, surgical consent, pre-op evaluation, timeout performed, IV checked, risks and benefits discussed and monitors and equipment checked Spinal Block Patient position: sitting Prep: ChloraPrep Patient monitoring: heart rate, cardiac monitor, continuous pulse ox and blood pressure Approach: midline Location: L3-4 Injection technique: single-shot Needle Needle type: Quincke  Needle gauge: 22 G Needle length: 9 cm

## 2017-07-07 NOTE — Op Note (Signed)
[name], [name]                ACCOUNT NO.:  [id number]  MEDICAL RECORD NO.:  [id number]  LOCATION:                                 FACILITY:  PHYSICIAN:  [name] [name]. Demika Langenderfer, M.D.     DATE OF BIRTH:  DATE OF PROCEDURE:  07/07/2017 DATE OF DISCHARGE:                              OPERATIVE REPORT   SURGEON:  [name] [name]. [name] [name], M.D.  ASSISTANT:  [name] [name], [place].  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  OPERATIONS: 1. Release of flexion contracture, left knee. 2. Left total knee arthroplasty utilizing DePuy system, all 3     components were cemented.  The gentamicin was used in the cement.     The sizes used was a size 3 right femoral component posterior     cruciate sacrificing type.  The tibial tray was a size 3.  The     insert size 3, 10 mm thickness.  The patella was a size 38 with 3     pegs.  DESCRIPTION OF PROCEDURE:  Under spinal anesthesia, routine orthopedic prep and draping of the left lower extremity were carried out.  He first had an adductor block.  At this particular time, the leg was exsanguinated and Esmarch.  Tourniquet was elevated to 350 mmHg. Following that, the leg was flexed and placed in the [facility] knee holder. Prior to doing this, we did the appropriate time-out and also marked the appropriate left leg in the holding area.  An incision was made anteriorly.  Two flaps were created.  I then carried out a median parapatellar incision, reflected patella laterally, and did medial and lateral meniscectomies and excised the anterior and posterior cruciate ligaments.  Note, the knee was extremely tight.  I did a nice medial release as well.  This time, initial drill hole was made in the femoral canal.  The guide rod was inserted.  We then removed that, thoroughly irrigated out the canal.  I then removed 11 mm  thickness off the distal femur.  We then measured the femur to be a size 3.  We did our appropriate anterior, posterior, and chamfering cuts for a size 3 left femoral component.  At that particular time, attention then was directed to the tibia.  We utilized the oscillating saw, removed the tibial spines.  We then completed our meniscectomies.  We then made our initial drill hole in the tibial plateau as we did.  There was a large ridge just about centimeter under the plateau and the drill somewhat deviated, and we had a small hole proximally posteriorly in the tibia, so we elected not to go any further and then we elected to go ahead and use the external device to cut our tibial plateau for safety sake.  We then took our approximately 4 mm thickness off the tibial plateau utilizing the medial side of the knee as a guide.  At this particular time, we then inserted our lamina spreaders, removed the posterior spurs in the femoral condyles and searched the area and made sure there were no other  spurs and they were not.  We then proceeded to continue to prepare our tibial plateau.  We then made our keel cuts in the usual fashion.  I then made our distal femoral notch cut.  We then inserted our trial components and had excellent function with a 10 mm thickness insert.  We measured the patella to be a size 38.  We did a resurfacing procedure on the patella in usual fashion.  Three drill holes were made then in the patella.  We then removed all the devices thoroughly, water picked out the knee, dried the knee out, and because of that small hole proximal and posteriorly, I decided to pack that area first with Gelfoam, so no cement would leak posteriorly.  We then cemented all 3 components in simultaneously.  We removed all loose pieces of cement __________ I then took a lateral x-ray to make sure there was no cement that extravasated posteriorly and there was not.  Following that, we irrigated out  the knee, removed all loose pieces of cement, water picked the knee out again and then inserted our permanent rotating platform size 310 mm thickness, reduced the knee and had excellent function.  I then thoroughly irrigated out the knee again, and following that, we went on and closed the wound over Hemovac drain.  The sterile dressings were applied __________ before we closed, we utilized the tranexamic acid locally.  He had 2 g of Ancef preop and 1 g of Tylenol IV __________ dressings were applied after the wound was closed.          ______________________________ [name] [name] [name] [name], M.D.     RAG/MEDQ  D:  07/07/2017  T:  07/07/2017  Job:  [id number]

## 2017-07-07 NOTE — Anesthesia Procedure Notes (Signed)
Anesthesia Regional Block: Adductor canal block   Pre-Anesthetic Checklist: ,, timeout performed, Correct Patient, Correct Site, Correct Laterality, Correct Procedure, Correct Position, site marked, Risks and benefits discussed,  Surgical consent,  Pre-op evaluation,  At surgeon's request and post-op pain management  Laterality: Left  Prep: chloraprep       Needles:  Injection technique: Single-shot  Needle Type: Stimiplex     Needle Length: 9cm  Needle Gauge: 21     Additional Needles:   Procedures:,,,, ultrasound used (permanent image in chart),,,,  Narrative:  Start time: 07/07/2017 10:51 AM End time: 07/07/2017 10:56 AM Injection made incrementally with aspirations every 5 mL.  Performed by: Personally  Anesthesiologist: Anitra Lauth RAY

## 2017-07-07 NOTE — Anesthesia Postprocedure Evaluation (Signed)
Anesthesia Post Note  Patient: [name] [name]  Procedure(s) Performed: LEFT TOTAL KNEE ARTHROPLASTY (Left Knee)     Patient location during evaluation: PACU Anesthesia Type: Spinal Level of consciousness: oriented and awake and alert Pain management: pain level controlled Vital Signs Assessment: post-procedure vital signs reviewed and stable Respiratory status: spontaneous breathing and respiratory function stable Cardiovascular status: blood pressure returned to baseline and stable Postop Assessment: no headache, no backache and no apparent nausea or vomiting Anesthetic complications: no    Last Vitals:  Vitals:   07/07/17 1515 07/07/17 1530  BP: 124/62 126/61  Pulse: (!) 51 (!) 54  Resp: 19 19  Temp:  36.8 C  SpO2: 98% 98%    Last Pain:  Vitals:   07/07/17 1530  TempSrc:   PainSc: 0-No pain    LLE Motor Response: Purposeful movement (07/07/17 1530) LLE Sensation: Decreased;No pain (07/07/17 1530) RLE Motor Response: Purposeful movement (07/07/17 1530) RLE Sensation: Decreased;No pain (07/07/17 1530)      Lowella Curb

## 2017-07-07 NOTE — Progress Notes (Signed)
Patient has home CPAP. [employer] called

## 2017-07-08 LAB — BASIC METABOLIC PANEL
ANION GAP: 9 (ref 5–15)
BUN: 18 mg/dL (ref 6–20)
CALCIUM: 9.1 mg/dL (ref 8.9–10.3)
CO2: 26 mmol/L (ref 22–32)
Chloride: 100 mmol/L — ABNORMAL LOW (ref 101–111)
Creatinine, Ser: 0.98 mg/dL (ref 0.61–1.24)
GFR calc Af Amer: >60 mL/min (ref >60)
GLUCOSE: 173 mg/dL — AB (ref 65–99)
Potassium: 3.8 mmol/L (ref 3.5–5.1)
Sodium: 135 mmol/L (ref 135–145)

## 2017-07-08 LAB — CBC
HCT: 41 % (ref 39.0–52.0)
Hemoglobin: 14.3 g/dL (ref 13.0–17.0)
MCH: 29.5 pg (ref 26.0–34.0)
MCHC: 34.9 g/dL (ref 30.0–36.0)
MCV: 84.7 fL (ref 78.0–100.0)
PLATELETS: 295 10*3/uL (ref 150–400)
RBC: 4.84 MIL/uL (ref 4.22–5.81)
RDW: 13.2 % (ref 11.5–15.5)
WBC: 16.8 10*3/uL — AB (ref 4.0–10.5)

## 2017-07-08 MED ORDER — FERROUS SULFATE 325 (65 FE) MG PO TABS: 325.0000 mg | ORAL_TABLET | Freq: Two times a day (BID) | ORAL | 0 refills | Status: DC

## 2017-07-08 MED ORDER — OXYCODONE-ACETAMINOPHEN 5-325 MG PO TABS: 1.0000 | ORAL_TABLET | ORAL | 0 refills | Status: DC | PRN

## 2017-07-08 MED ORDER — METHOCARBAMOL 500 MG PO TABS: 500.0000 mg | ORAL_TABLET | Freq: Four times a day (QID) | ORAL | 0 refills | Status: DC | PRN

## 2017-07-08 MED ORDER — DIPHENHYDRAMINE HCL 25 MG PO CAPS
25.0000 mg | ORAL_CAPSULE | Freq: Four times a day (QID) | ORAL | Status: DC | PRN
  Administered 2017-07-08 – 2017-07-09 (×2): 25 mg via ORAL
  Filled 2017-07-08 (×2): qty 1

## 2017-07-08 NOTE — Evaluation (Signed)
Occupational Therapy Evaluation Patient Details Name: [name] [name] MRN: [id number] DOB: 04/08/1951 Today's Date: 07/08/2017    History of Present Illness s/p L TKA   Clinical Impression   This 66 year old man was admitted for the above sx. All education was completed. No further OT is needed at this time    Follow Up Recommendations  Supervision/Assistance - 24 hour    Equipment Recommendations  None recommended by OT    Recommendations for Other Services       Precautions / Restrictions Precautions Precautions: Fall;Knee Required Braces or Orthoses: Knee Immobilizer - Left Knee Immobilizer - Left: Discontinue once straight leg raise with < 10 degree lag. Able to do SLR Restrictions Weight Bearing Restrictions: No Other Position/Activity Restrictions: WBAT      Mobility Bed Mobility Overal bed mobility: Needs Assistance Bed Mobility: Supine to Sit     Supine to sit: Supervision     General bed mobility comments: cues to use hands.  Pt tended to use momentum to get up  Transfers Overall transfer level: Needs assistance Equipment used: Rolling walker (2 wheeled) Transfers: Sit to/from Stand Sit to Stand: Min guard         General transfer comment: for safety    Balance                                           ADL either performed or assessed with clinical judgement   ADL Overall ADL's : Needs assistance/impaired Eating/Feeding: Independent   Grooming: Supervision/safety;Standing   Upper Body Bathing: Set up;Sitting   Lower Body Bathing: Minimal assistance;Sit to/from stand   Upper Body Dressing : Set up;Sitting   Lower Body Dressing: Moderate assistance;Sit to/from stand   Toilet Transfer: Min guard;Ambulation;BSC;RW;Comfort height toilet   Toileting- Clothing Manipulation and Hygiene: Min guard;Sit to/from stand   Tub/ Engineer, structural: Walk-in shower;Ambulation;Min guard;3 in 1     General ADL Comments: wife will  assist pt as needed. cues for sequence when ambulating:  this was pt's first time OOB.  Able to perform bathroom transfers and verbalizes understanding of safety as well as precautions     Vision         Perception     Praxis      Pertinent Vitals/Pain Pain Assessment: 0-10 Pain Score: 3  Pain Location: L knee and thigh Pain Descriptors / Indicators: Sore Pain Intervention(s): Limited activity within patient's tolerance;Monitored during session;Premedicated before session;Repositioned;Ice applied     Hand Dominance     Extremity/Trunk Assessment Upper Extremity Assessment Upper Extremity Assessment: Defer to OT evaluation          Communication Communication Communication: No difficulties   Cognition Arousal/Alertness: Awake/alert Behavior During Therapy: WFL for tasks assessed/performed Overall Cognitive Status: Within Functional Limits for tasks assessed                                     General Comments       Exercises    Shoulder Instructions      Home Living Family/patient expects to be discharged to:: Private residence Living Arrangements: Spouse/significant other Available Help at Discharge: Family Type of Home: House Home Access: Level entry     Home Layout: One level  Prior Functioning/Environment Level of Independence: Independent                 OT Problem List:        OT Treatment/Interventions:      OT Goals(Current goals can be found in the care plan section) Acute Rehab OT Goals Patient Stated Goal: home   OT Frequency: Min 2X/week   Barriers to D/C:            Co-evaluation              AM-PAC PT "6 Clicks" Daily Activity     Outcome Measure Help from another person eating meals?: None Help from another person taking care of personal grooming?: A Little Help from another person toileting, which includes using toliet, bedpan, or urinal?: A Little Help from  another person bathing (including washing, rinsing, drying)?: A Little Help from another person to put on and taking off regular upper body clothing?: A Little Help from another person to put on and taking off regular lower body clothing?: A Lot 6 Click Score: 18   End of Session    Activity Tolerance: Patient tolerated treatment well Patient left: in chair;with call bell/phone within reach;with family/visitor present  OT Visit Diagnosis: Pain Pain - Right/Left: Left Pain - part of body: Knee                Time: 1002-1027 OT Time Calculation (min): 25 min Charges:  OT General Charges $OT Visit: 1 Visit OT Evaluation $OT Eval Low Complexity: 1 Low OT Treatments $Self Care/Home Management : 8-22 mins G-Codes:     [place], OTR/L [phone] 07/08/2017  [name] [name] 07/08/2017, 12:44 PM

## 2017-07-08 NOTE — Progress Notes (Signed)
Subjective: 1 Day Post-Op Procedure(s) (LRB): LEFT TOTAL KNEE ARTHROPLASTY (Left) Patient reports pain as 1 on 0-10 scale. Dressing changed and Hemovac DCd. Calf is soft and good Dorsiflexion of his foot.   Objective: Vital signs in last 24 hours: Temp:  [97.6 F (36.4 C)-98.5 F (36.9 C)] 98.1 F (36.7 C) (10/04 0548) Pulse Rate:  [48-69] 69 (10/04 0548) Resp:  [10-23] 16 (10/04 0548) BP: (91-192)/(51-114) 136/64 (10/04 0548) SpO2:  [89 %-99 %] 94 % (10/04 0548) Weight:  [112 kg (247 lb)] 112 kg (247 lb) (10/03 0909)  Intake/Output from previous day: 10/03 0701 - 10/04 0700 In: 2720 [P.O.:480; I.V.:2240] Out: 2320 [Urine:2000; Drains:170; Blood:150] Intake/Output this shift: No intake/output data recorded.   Recent Labs  07/08/17 0451  HGB 14.3    Recent Labs  07/08/17 0451  WBC 16.8*  RBC 4.84  HCT 41.0  PLT 295    Recent Labs  07/08/17 0451  NA 135  K 3.8  CL 100*  CO2 26  BUN 18  CREATININE 0.98  GLUCOSE 173*  CALCIUM 9.1   No results for input(s): LABPT, INR in the last 72 hours.  Dorsiflexion/Plantar flexion intact No cellulitis present Compartment soft  Assessment/Plan: 1 Day Post-Op Procedure(s) (LRB): LEFT TOTAL KNEE ARTHROPLASTY (Left) Up with therapy  Ona Roehrs A 07/08/2017, 7:24 AM

## 2017-07-08 NOTE — Evaluation (Signed)
Physical Therapy Evaluation Patient Details Name: [name] [name] MRN: [id number] DOB: 1951-04-05 Today's Date: 07/08/2017   History of Present Illness  s/p L TKA  Clinical Impression  Pt is s/p TKA resulting in the deficits listed below (see PT Problem List).  Pt will benefit from skilled PT to increase their independence and safety with mobility to allow discharge to the venue listed below.      Follow Up Recommendations DC plan and follow up therapy as arranged by surgeon;Outpatient PT    Equipment Recommendations  Other (comment) (TBD)    Recommendations for Other Services       Precautions / Restrictions Precautions Precautions: Fall;Knee Required Braces or Orthoses: Knee Immobilizer - Left Knee Immobilizer - Left: Discontinue once straight leg raise with < 10 degree lag Restrictions Weight Bearing Restrictions: No Other Position/Activity Restrictions: WBAT      Mobility  Bed Mobility               General bed mobility comments: OOB with OT  Transfers Overall transfer level: Needs assistance Equipment used: Rolling walker (2 wheeled) Transfers: Sit to/from Stand              Ambulation/Gait Ambulation/Gait assistance: Min guard Ambulation Distance (Feet): 160 Feet Assistive device: Rolling walker (2 wheeled) Gait Pattern/deviations: Step-to pattern;Decreased stance time - left     General Gait Details: cues for sequence and RW position  Stairs            Wheelchair Mobility    Modified Rankin (Stroke Patients Only)       Balance                                             Pertinent Vitals/Pain Pain Assessment: 0-10 Pain Score: 3  Pain Location: L knee and thigh Pain Descriptors / Indicators: Sore Pain Intervention(s): Limited activity within patient's tolerance;Monitored during session;Premedicated before session;Ice applied    Home Living Family/patient expects to be discharged to:: Private  residence Living Arrangements: Spouse/significant other Available Help at Discharge: Family Type of Home: House Home Access: Level entry     Home Layout: One level        Prior Function Level of Independence: Independent               Hand Dominance        Extremity/Trunk Assessment   Upper Extremity Assessment Upper Extremity Assessment: Defer to OT evaluation    Lower Extremity Assessment Lower Extremity Assessment: LLE deficits/detail LLE Deficits / Details: hip flexion and knee extension 2+/5; df 2/5, AAROM grossly 10* to 45* knee flexion       Communication   Communication: No difficulties  Cognition Arousal/Alertness: Awake/alert Behavior During Therapy: WFL for tasks assessed/performed Overall Cognitive Status: Within Functional Limits for tasks assessed                                        General Comments      Exercises Total Joint Exercises Ankle Circles/Pumps: AROM;Both;10 reps Quad Sets: 10 reps;Both;AROM Heel Slides: AROM;10 reps;Left Straight Leg Raises: AROM;Left;10 reps   Assessment/Plan    PT Assessment Patient needs continued PT services  PT Problem List Decreased strength;Decreased range of motion;Decreased mobility;Pain       PT Treatment Interventions DME instruction;Gait  training;Functional mobility training;Therapeutic activities;Therapeutic exercise;Patient/family education    PT Goals (Current goals can be found in the Care Plan section)  Acute Rehab PT Goals Patient Stated Goal: home  PT Goal Formulation: With patient Time For Goal Achievement: 07/12/17 Potential to Achieve Goals: Good    Frequency 7X/week   Barriers to discharge        Co-evaluation               AM-PAC PT "6 Clicks" Daily Activity  Outcome Measure Difficulty turning over in bed (including adjusting bedclothes, sheets and blankets)?: A Little Difficulty moving from lying on back to sitting on the side of the bed? : A  Little Difficulty sitting down on and standing up from a chair with arms (e.g., wheelchair, bedside commode, etc,.)?: A Little Help needed moving to and from a bed to chair (including a wheelchair)?: A Little Help needed walking in hospital room?: A Little Help needed climbing 3-5 steps with a railing? : A Little 6 Click Score: 18    End of Session Equipment Utilized During Treatment: Gait belt Activity Tolerance: Patient tolerated treatment well Patient left: with call bell/phone within reach;with family/visitor present;in chair Nurse Communication: Mobility status PT Visit Diagnosis: Difficulty in walking, not elsewhere classified (R26.2)    Time: 1053-1110 PT Time Calculation (min) (ACUTE ONLY): 17 min   Charges:   PT Evaluation $PT Eval Low Complexity: 1 Low     PT G CodesDrucilla Chalet, PT Pager: [phone] 07/08/2017   Drucilla Chalet 07/08/2017, 12:07 PM

## 2017-07-08 NOTE — Progress Notes (Signed)
   07/08/17 1400  PT Visit Information  Last PT Received On 07/08/17  Assistance Needed +1  History of Present Illness s/p L TKA  Subjective Data  Patient Stated Goal home   Precautions  Precautions Fall;Knee  Required Braces or Orthoses Knee Immobilizer - Left  Knee Immobilizer - Left Discontinue once straight leg raise with < 10 degree lag  Restrictions  Weight Bearing Restrictions No  Other Position/Activity Restrictions WBAT  Pain Assessment  Pain Score 4  Pain Location L knee and thigh  Pain Descriptors / Indicators Sore  Pain Intervention(s) Limited activity within patient's tolerance;Monitored during session;Premedicated before session;Ice applied  Cognition  Arousal/Alertness Awake/alert  Behavior During Therapy WFL for tasks assessed/performed  Overall Cognitive Status Within Functional Limits for tasks assessed  Transfers  Overall transfer level Needs assistance  Equipment used Rolling walker (2 wheeled)  Transfers Sit to/from Stand  Sit to Stand Supervision  General transfer comment for safety, cues for hand placement  Ambulation/Gait  Ambulation/Gait assistance Min guard  Ambulation Distance (Feet) 120 Feet  Assistive device Rolling walker (2 wheeled)  Gait Pattern/deviations Step-to pattern;Decreased stance time - left  General Gait Details cues for sequence and RW position  Gait velocity pt doing numerous exercisees whil amb in hallway--hamstring curls, hip flexor stretch, squats, knee flexion  Total Joint Exercises  Ankle Circles/Pumps AROM;Both;10 reps  Heel Slides AROM;10 reps;Left  Straight Leg Raises AROM;Left;10 reps  Knee Flexion AROM;10 reps;Left  PT - End of Session  Equipment Utilized During Treatment Gait belt  Activity Tolerance Patient tolerated treatment well  Patient left with call bell/phone within reach;with family/visitor present;in chair  Nurse Communication Mobility status  PT - Assessment/Plan  PT Plan Current plan remains appropriate   PT Visit Diagnosis Difficulty in walking, not elsewhere classified (R26.2)  PT Frequency (ACUTE ONLY) 7X/week  Follow Up Recommendations DC plan and follow up therapy as arranged by surgeon;Outpatient PT  PT equipment Rolling walker with 5" wheels  AM-PAC PT "6 Clicks" Daily Activity Outcome Measure  Difficulty turning over in bed (including adjusting bedclothes, sheets and blankets)? 3  Difficulty moving from lying on back to sitting on the side of the bed?  3  Difficulty sitting down on and standing up from a chair with arms (e.g., wheelchair, bedside commode, etc,.)? 3  Help needed moving to and from a bed to chair (including a wheelchair)? 3  Help needed walking in hospital room? 3  Help needed climbing 3-5 steps with a railing?  3  6 Click Score 18  Mobility G Code  CK  PT Goal Progression  Progress towards PT goals Progressing toward goals  Acute Rehab PT Goals  PT Goal Formulation With patient  Time For Goal Achievement 07/12/17  Potential to Achieve Goals Good  PT Time Calculation  PT Start Time (ACUTE ONLY) 1411  PT Stop Time (ACUTE ONLY) 1434  PT Time Calculation (min) (ACUTE ONLY) 23 min  PT General Charges  $$ ACUTE PT VISIT 1 Visit  PT Treatments  $Gait Training 8-22 mins  $Therapeutic Exercise 8-22 mins

## 2017-07-08 NOTE — Discharge Instructions (Signed)

## 2017-07-08 NOTE — Progress Notes (Signed)
Discharge plan:  HHPT ordered and choice offered. [facility] chosen and referral faxed [phone]) phone [phone]). RW requested and order received. [facility] alerted of need for RW. Sandford Craze RN,BSN,NCM [phone]

## 2017-07-08 NOTE — Addendum Note (Signed)
Addendum  created 07/08/17 0710 by Elyn Peers, CRNA   Charge Capture section accepted

## 2017-07-09 LAB — BASIC METABOLIC PANEL
ANION GAP: 11 (ref 5–15)
BUN: 20 mg/dL (ref 6–20)
CO2: 27 mmol/L (ref 22–32)
CREATININE: 0.88 mg/dL (ref 0.61–1.24)
Calcium: 9.3 mg/dL (ref 8.9–10.3)
Chloride: 99 mmol/L — ABNORMAL LOW (ref 101–111)
GFR calc Af Amer: >60 mL/min (ref >60)
GLUCOSE: 148 mg/dL — AB (ref 65–99)
Potassium: 3.7 mmol/L (ref 3.5–5.1)
Sodium: 137 mmol/L (ref 135–145)

## 2017-07-09 LAB — CBC
HCT: 41 % (ref 39.0–52.0)
HEMOGLOBIN: 14.2 g/dL (ref 13.0–17.0)
MCH: 29.2 pg (ref 26.0–34.0)
MCHC: 34.6 g/dL (ref 30.0–36.0)
MCV: 84.4 fL (ref 78.0–100.0)
PLATELETS: 271 10*3/uL (ref 150–400)
RBC: 4.86 MIL/uL (ref 4.22–5.81)
RDW: 13.3 % (ref 11.5–15.5)
WBC: 15.5 10*3/uL — ABNORMAL HIGH (ref 4.0–10.5)

## 2017-07-09 NOTE — Progress Notes (Signed)
Physical Therapy Treatment Patient Details Name: [name] [name] MRN: [id number] DOB: 1951/06/13 Today's Date: 07/09/2017    History of Present Illness s/p L TKA    PT Comments    Making excellent progress, feels ready to D/C today   Follow Up Recommendations  DC plan and follow up therapy as arranged by surgeon;Outpatient PT     Equipment Recommendations  Rolling walker with 5" wheels    Recommendations for Other Services       Precautions / Restrictions Precautions Precautions: Fall;Knee Required Braces or Orthoses: Knee Immobilizer - Left Knee Immobilizer - Left: Discontinue once straight leg raise with < 10 degree lag Restrictions Weight Bearing Restrictions: No Other Position/Activity Restrictions: WBAT    Mobility  Bed Mobility               General bed mobility comments: in recliner  Transfers Overall transfer level: Modified independent Equipment used: Rolling walker (2 wheeled)                Ambulation/Gait Ambulation/Gait assistance: Supervision;Modified independent (Device/Increase time) Ambulation Distance (Feet): 150 Feet Assistive device: Rolling walker (2 wheeled) Gait Pattern/deviations: Step-through pattern Gait velocity: pt doing numerous exercises whil amb in hallway--hamstring curls, hip flexor stretch, squats, knee flexion   General Gait Details: cues for gait progression, heel strike, terminal knee extension   Stairs            Wheelchair Mobility    Modified Rankin (Stroke Patients Only)       Balance                                            Cognition Arousal/Alertness: Awake/alert Behavior During Therapy: WFL for tasks assessed/performed Overall Cognitive Status: Within Functional Limits for tasks assessed                                        Exercises Total Joint Exercises Ankle Circles/Pumps: AROM;Both;10 reps Quad Sets: 10 reps;Both;AROM Towel Squeeze:  AROM;Both;10 reps Heel Slides: AROM;10 reps;Left Hip ABduction/ADduction: AROM;Strengthening;Left;10 reps Straight Leg Raises: AROM;Left;10 reps Knee Flexion: AROM;10 reps;Left Goniometric ROM: 10*- 90*    General Comments        Pertinent Vitals/Pain Pain Score: 3  Pain Location: L knee and thigh Pain Descriptors / Indicators: Sore Pain Intervention(s): Monitored during session;Ice applied    Home Living                      Prior Function            PT Goals (current goals can now be found in the care plan section) Acute Rehab PT Goals Patient Stated Goal: home  PT Goal Formulation: With patient Time For Goal Achievement: 07/12/17 Potential to Achieve Goals: Good Progress towards PT goals: Progressing toward goals    Frequency    7X/week      PT Plan Current plan remains appropriate    Co-evaluation              AM-PAC PT "6 Clicks" Daily Activity  Outcome Measure  Difficulty turning over in bed (including adjusting bedclothes, sheets and blankets)?: A Little Difficulty moving from lying on back to sitting on the side of the bed? : A Little Difficulty sitting down on and standing up  from a chair with arms (e.g., wheelchair, bedside commode, etc,.)?: A Little Help needed moving to and from a bed to chair (including a wheelchair)?: A Little Help needed walking in hospital room?: A Little Help needed climbing 3-5 steps with a railing? : A Little 6 Click Score: 18    End of Session Equipment Utilized During Treatment: Gait belt Activity Tolerance: Patient tolerated treatment well Patient left: with call bell/phone within reach;with family/visitor present;in chair Nurse Communication: Mobility status PT Visit Diagnosis: Difficulty in walking, not elsewhere classified (R26.2)     Time: 6979-4801 PT Time Calculation (min) (ACUTE ONLY): 24 min  Charges:  $Gait Training: 8-22 mins $Therapeutic Exercise: 8-22 mins                    G Codes:           Caley Ciaramitaro 07/09/2017, 9:56 AM

## 2017-07-09 NOTE — Progress Notes (Signed)
Subjective: 2 Days Post-Op Procedure(s) (LRB): LEFT TOTAL KNEE ARTHROPLASTY (Left) Patient reports pain as 1 on 0-10 scale. Doing very well. Ready for DC   Objective: Vital signs in last 24 hours: Temp:  [97.6 F (36.4 C)-98.1 F (36.7 C)] 98.1 F (36.7 C) (10/05 0534) Pulse Rate:  [58-63] 58 (10/05 0534) Resp:  [16] 16 (10/05 0534) BP: (146-172)/(69-78) 172/78 (10/05 0534) SpO2:  [93 %-95 %] 95 % (10/05 0534)  Intake/Output from previous day: 10/04 0701 - 10/05 0700 In: 1490 [P.O.:1370; I.V.:120] Out: 2125 [Urine:2125] Intake/Output this shift: No intake/output data recorded.   Recent Labs  07/08/17 0451 07/09/17 0512  HGB 14.3 14.2    Recent Labs  07/08/17 0451 07/09/17 0512  WBC 16.8* 15.5*  RBC 4.84 4.86  HCT 41.0 41.0  PLT 295 271    Recent Labs  07/08/17 0451 07/09/17 0512  NA 135 137  K 3.8 3.7  CL 100* 99*  CO2 26 27  BUN 18 20  CREATININE 0.98 0.88  GLUCOSE 173* 148*  CALCIUM 9.1 9.3   No results for input(s): LABPT, INR in the last 72 hours.  Dorsiflexion/Plantar flexion intact  Assessment/Plan: 2 Days Post-Op Procedure(s) (LRB): LEFT TOTAL KNEE ARTHROPLASTY (Left) Up with therapy Discharge today  Vickye Astorino A 07/09/2017, 7:09 AM

## 2017-07-09 NOTE — Progress Notes (Signed)
RN reviewed discharge instructions with patient and family. All questions answered.   Paperwork and prescriptions given.   NT rolled patient down with all belongings to family.

## 2017-07-11 IMAGING — DX DG CHEST 2V
2 series · 2 of 2 positions shown · non-contrast
Comparison: 08/19/2016

CLINICAL DATA: Chest pain and dyspnea today. History CABG in atrial
fibrillation.

EXAM:
CHEST  2 VIEW

[w chest lat]
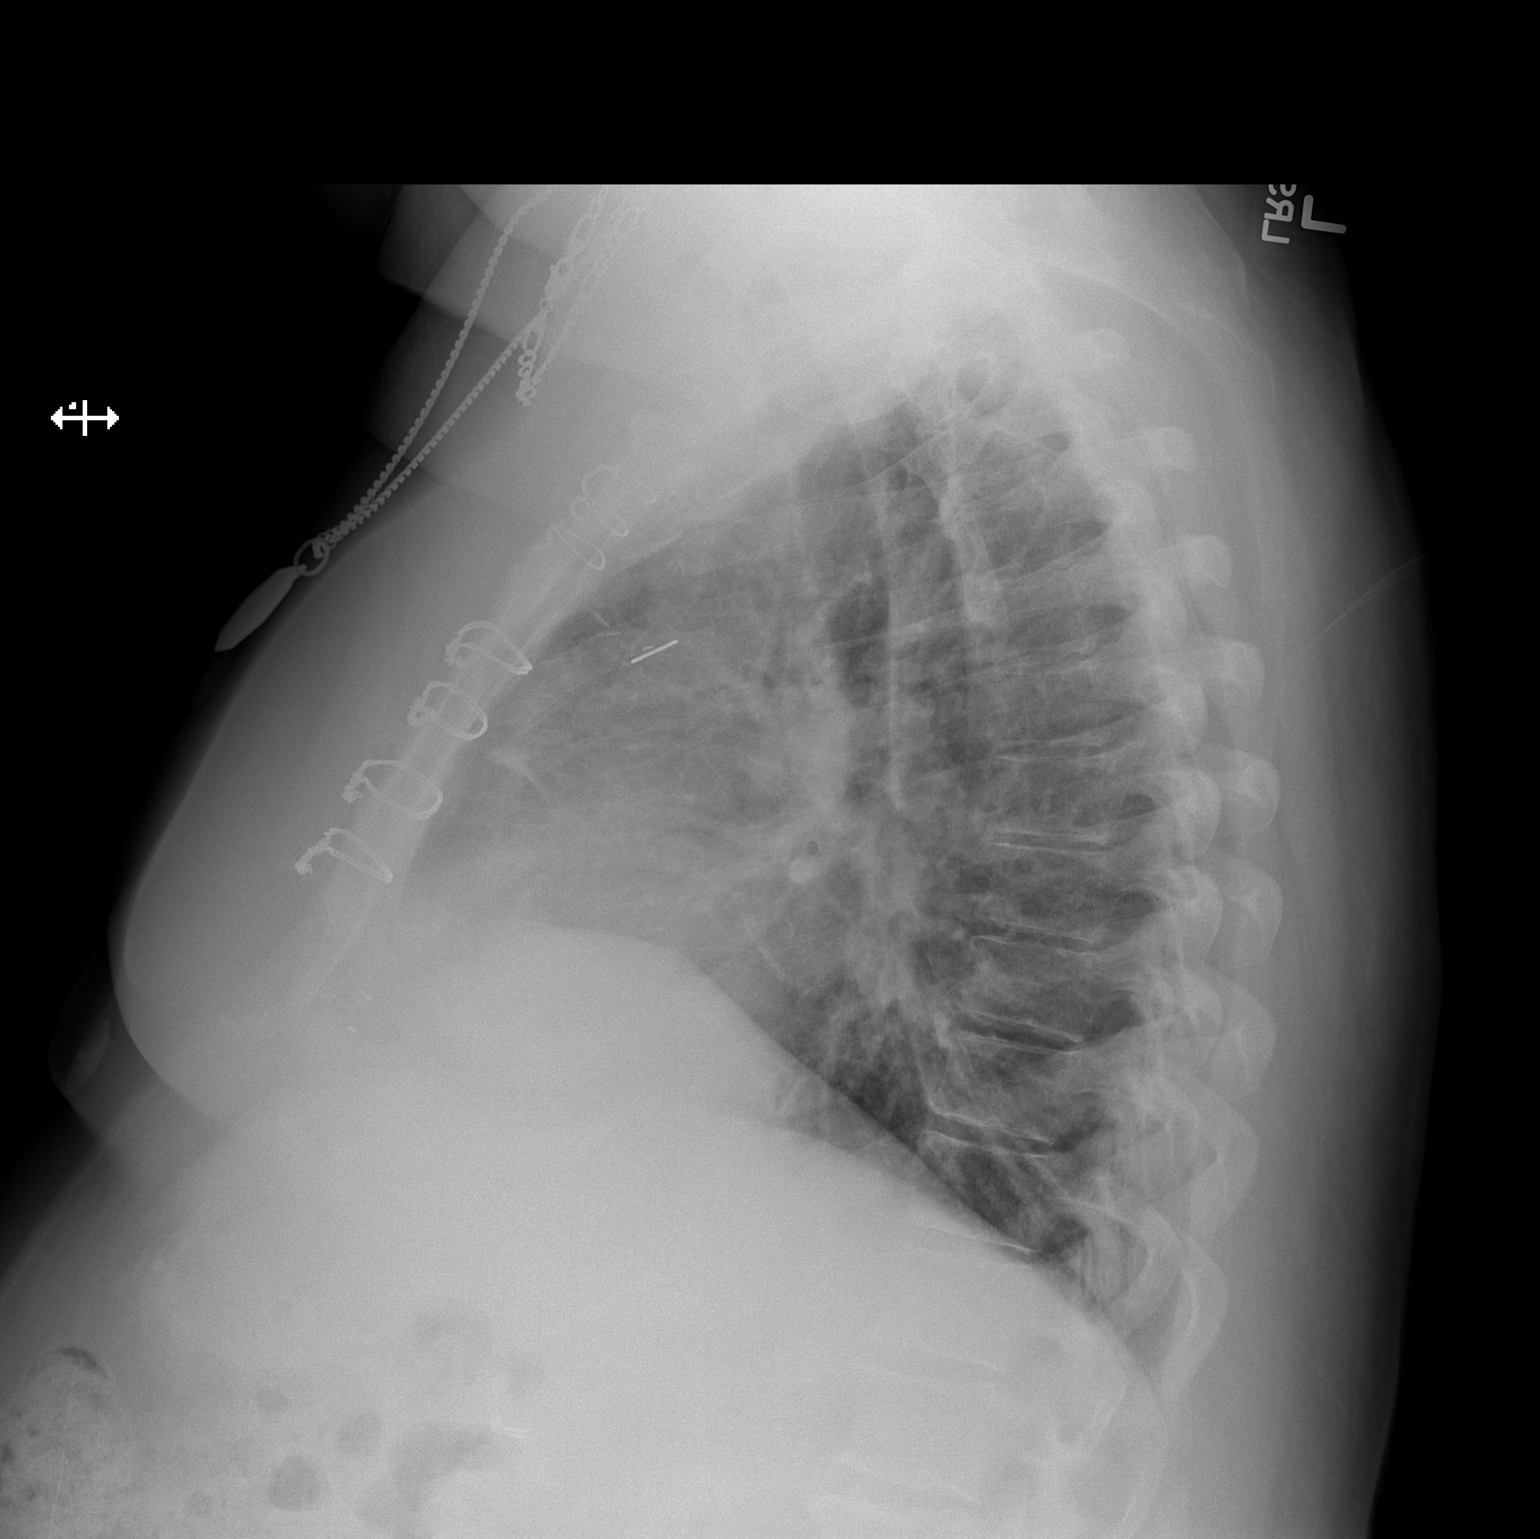

[w chest pa]
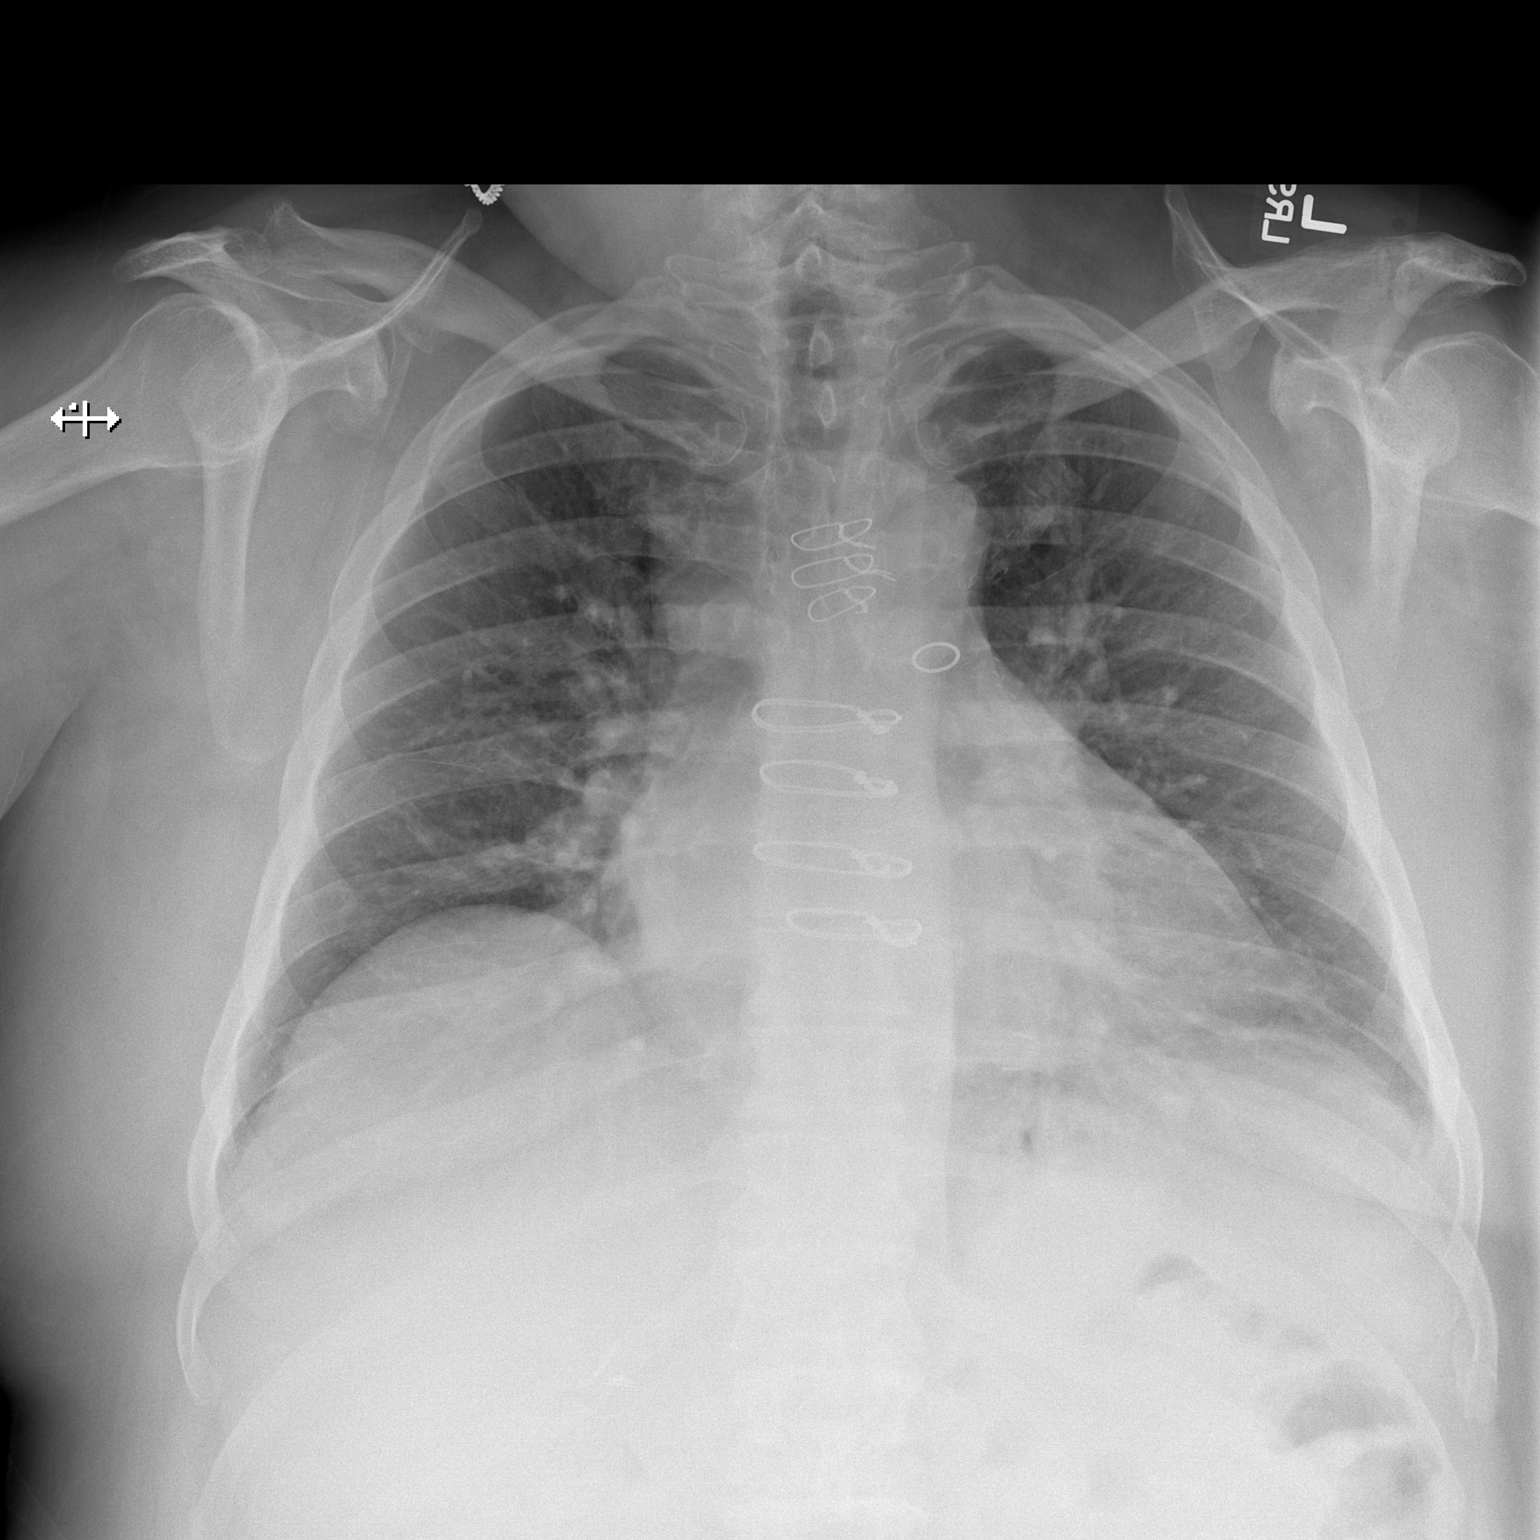

[2 of 2 positions shown; findings below may reference images not displayed]

FINDINGS: The heart is enlarged. The patient is status post median sternotomy.
Mild interstitial prominence without overt pulmonary edema,
pneumonic consolidation, effusion or pneumothorax. Minimal
atelectasis at the lung bases left greater right. No acute osseous
abnormality.
IMPRESSION: Bibasilar subsegmental atelectasis with stable cardiomegaly.

Aortic atherosclerosis.  No significant change from prior.

## 2017-07-12 DIAGNOSIS — I110 Hypertensive heart disease with heart failure: Secondary | ICD-10-CM | POA: Diagnosis not present

## 2017-07-12 DIAGNOSIS — R269 Unspecified abnormalities of gait and mobility: Secondary | ICD-10-CM | POA: Diagnosis not present

## 2017-07-12 DIAGNOSIS — G4733 Obstructive sleep apnea (adult) (pediatric): Secondary | ICD-10-CM | POA: Diagnosis not present

## 2017-07-12 DIAGNOSIS — I509 Heart failure, unspecified: Secondary | ICD-10-CM | POA: Diagnosis not present

## 2017-07-12 DIAGNOSIS — I2510 Atherosclerotic heart disease of native coronary artery without angina pectoris: Secondary | ICD-10-CM | POA: Diagnosis not present

## 2017-07-12 DIAGNOSIS — I480 Paroxysmal atrial fibrillation: Secondary | ICD-10-CM | POA: Diagnosis not present

## 2017-07-12 DIAGNOSIS — R69 Illness, unspecified: Secondary | ICD-10-CM | POA: Diagnosis not present

## 2017-07-12 DIAGNOSIS — Z471 Aftercare following joint replacement surgery: Secondary | ICD-10-CM | POA: Diagnosis not present

## 2017-07-12 NOTE — Discharge Summary (Signed)
Physician Discharge Summary   Patient ID: [name] [name] MRN: [id number] DOB/AGE: 11-03-1950 66 y.o.  Admit date: 07/07/2017 Discharge date: 07/09/2017  Primary Diagnosis: Primary osteoarthritis left knee   Admission Diagnoses:  Past Medical History:  Diagnosis Date  . Anxiety   . Chronic chest pain   . Chronic sinus bradycardia   . Coronary artery disease    2v CABG, 2004 [facility])  . DJD (degenerative joint disease)   . Edema   . HLD (hyperlipidemia)   . Hypertension   . MI, old 2004  . OA (osteoarthritis)   . Obesity   . PAF (paroxysmal atrial fibrillation) ([place])    a. started on sotalol 08/2016.  Marland Kitchen Palpitations    Reported history of PVCs  . QT prolongation   . Sleep apnea    on CPAP  . Spinal stenosis   . Typical atrial flutter [facility])    Discharge Diagnoses:   Active Problems:   Hx of total knee replacement, left  Estimated body mass index is 39.87 kg/m as calculated from the following:   Height as of this encounter: _0  (1.676 m).   Weight as of this encounter: 112 kg (247 lb).  Procedure:  Procedure(s) (LRB): LEFT TOTAL KNEE ARTHROPLASTY (Left)   Consults: None  HPI: [name] [name], 66 y.o. male, has a history of pain and functional disability in the left knee due to arthritis and has failed non-surgical conservative treatments for greater than 12 weeks to includeNSAID's and/or analgesics, corticosteriod injections, flexibility and strengthening excercises and activity modification.  Onset of symptoms was gradual, starting >10 years ago with gradually worsening course since that time. The patient noted no past surgery on the left knee(s).  Patient currently rates pain in the left knee(s) at 7 out of 10 with activity. Patient has night pain, worsening of pain with activity and weight bearing, pain that interferes with activities of daily living, pain with passive range of motion, crepitus and joint swelling.  Patient has evidence of periarticular osteophytes and  joint space narrowing by imaging studies. There is no active infection.  Laboratory Data: Admission on 07/07/2017, Discharged on 07/09/2017  Component Date Value Ref Range Status  . WBC 07/08/2017 16.8* 4.0 - 10.5 K/uL Final  . RBC 07/08/2017 4.84  4.22 - 5.81 MIL/uL Final  . Hemoglobin 07/08/2017 14.3  13.0 - 17.0 g/dL Final  . HCT 07/08/2017 41.0  39.0 - 52.0 % Final  . MCV 07/08/2017 84.7  78.0 - 100.0 fL Final  . MCH 07/08/2017 29.5  26.0 - 34.0 pg Final  . MCHC 07/08/2017 34.9  30.0 - 36.0 g/dL Final  . RDW 07/08/2017 13.2  11.5 - 15.5 % Final  . Platelets 07/08/2017 295  150 - 400 K/uL Final  . Sodium 07/08/2017 135  135 - 145 mmol/L Final  . Potassium 07/08/2017 3.8  3.5 - 5.1 mmol/L Final  . Chloride 07/08/2017 100* 101 - 111 mmol/L Final  . CO2 07/08/2017 26  22 - 32 mmol/L Final  . Glucose, Bld 07/08/2017 173* 65 - 99 mg/dL Final  . BUN 07/08/2017 18  6 - 20 mg/dL Final  . Creatinine, Ser 07/08/2017 0.98  0.61 - 1.24 mg/dL Final  . Calcium 07/08/2017 9.1  8.9 - 10.3 mg/dL Final  . GFR calc non Af Amer 07/08/2017 >60  >60 mL/min Final  . GFR calc Af Amer 07/08/2017 >60  >60 mL/min Final   Comment: (NOTE) The eGFR has been calculated using the CKD EPI equation.  This calculation has not been validated in all clinical situations. eGFR's persistently <60 mL/min signify possible Chronic Kidney Disease.   . Anion gap 07/08/2017 9  5 - 15 Final  . WBC 07/09/2017 15.5* 4.0 - 10.5 K/uL Final  . RBC 07/09/2017 4.86  4.22 - 5.81 MIL/uL Final  . Hemoglobin 07/09/2017 14.2  13.0 - 17.0 g/dL Final  . HCT 07/09/2017 41.0  39.0 - 52.0 % Final  . MCV 07/09/2017 84.4  78.0 - 100.0 fL Final  . MCH 07/09/2017 29.2  26.0 - 34.0 pg Final  . MCHC 07/09/2017 34.6  30.0 - 36.0 g/dL Final  . RDW 07/09/2017 13.3  11.5 - 15.5 % Final  . Platelets 07/09/2017 271  150 - 400 K/uL Final  . Sodium 07/09/2017 137  135 - 145 mmol/L Final  . Potassium 07/09/2017 3.7  3.5 - 5.1 mmol/L Final  . Chloride  07/09/2017 99* 101 - 111 mmol/L Final  . CO2 07/09/2017 27  22 - 32 mmol/L Final  . Glucose, Bld 07/09/2017 148* 65 - 99 mg/dL Final  . BUN 07/09/2017 20  6 - 20 mg/dL Final  . Creatinine, Ser 07/09/2017 0.88  0.61 - 1.24 mg/dL Final  . Calcium 07/09/2017 9.3  8.9 - 10.3 mg/dL Final  . GFR calc non Af Amer 07/09/2017 >60  >60 mL/min Final  . GFR calc Af Amer 07/09/2017 >60  >60 mL/min Final   Comment: (NOTE) The eGFR has been calculated using the CKD EPI equation. This calculation has not been validated in all clinical situations. eGFR's persistently <60 mL/min signify possible Chronic Kidney Disease.   Georgiann Hahn gap 07/09/2017 11  5 - 15 Final  Hospital Outpatient Visit on 06/30/2017  Component Date Value Ref Range Status  . aPTT 06/30/2017 34  24 - 36 seconds Final  . WBC 06/30/2017 11.3* 4.0 - 10.5 K/uL Final  . RBC 06/30/2017 5.02  4.22 - 5.81 MIL/uL Final  . Hemoglobin 06/30/2017 14.8  13.0 - 17.0 g/dL Final  . HCT 06/30/2017 43.0  39.0 - 52.0 % Final  . MCV 06/30/2017 85.7  78.0 - 100.0 fL Final  . MCH 06/30/2017 29.5  26.0 - 34.0 pg Final  . MCHC 06/30/2017 34.4  30.0 - 36.0 g/dL Final  . RDW 06/30/2017 13.6  11.5 - 15.5 % Final  . Platelets 06/30/2017 308  150 - 400 K/uL Final  . Neutrophils Relative % 06/30/2017 69  % Final  . Neutro Abs 06/30/2017 7.8* 1.7 - 7.7 K/uL Final  . Lymphocytes Relative 06/30/2017 20  % Final  . Lymphs Abs 06/30/2017 2.2  0.7 - 4.0 K/uL Final  . Monocytes Relative 06/30/2017 10  % Final  . Monocytes Absolute 06/30/2017 1.1* 0.1 - 1.0 K/uL Final  . Eosinophils Relative 06/30/2017 1  % Final  . Eosinophils Absolute 06/30/2017 0.2  0.0 - 0.7 K/uL Final  . Basophils Relative 06/30/2017 0  % Final  . Basophils Absolute 06/30/2017 0.0  0.0 - 0.1 K/uL Final  . Sodium 06/30/2017 139  135 - 145 mmol/L Final  . Potassium 06/30/2017 4.5  3.5 - 5.1 mmol/L Final  . Chloride 06/30/2017 102  101 - 111 mmol/L Final  . CO2 06/30/2017 27  22 - 32 mmol/L Final    . Glucose, Bld 06/30/2017 105* 65 - 99 mg/dL Final  . BUN 06/30/2017 23* 6 - 20 mg/dL Final  . Creatinine, Ser 06/30/2017 1.15  0.61 - 1.24 mg/dL Final  . Calcium 06/30/2017 9.7  8.9 -  10.3 mg/dL Final  . Total Protein 06/30/2017 8.1  6.5 - 8.1 g/dL Final  . Albumin 06/30/2017 3.8  3.5 - 5.0 g/dL Final  . AST 06/30/2017 33  15 - 41 U/L Final  . ALT 06/30/2017 22  17 - 63 U/L Final  . Alkaline Phosphatase 06/30/2017 78  38 - 126 U/L Final  . Total Bilirubin 06/30/2017 1.2  0.3 - 1.2 mg/dL Final  . GFR calc non Af Amer 06/30/2017 >60  >60 mL/min Final  . GFR calc Af Amer 06/30/2017 >60  >60 mL/min Final   Comment: (NOTE) The eGFR has been calculated using the CKD EPI equation. This calculation has not been validated in all clinical situations. eGFR's persistently <60 mL/min signify possible Chronic Kidney Disease.   . Anion gap 06/30/2017 10  5 - 15 Final  . Prothrombin Time 06/30/2017 14.5  11.4 - 15.2 seconds Final  . INR 06/30/2017 1.14   Final  . ABO/RH(D) 06/30/2017 O POS   Final  . Antibody Screen 06/30/2017 NEG   Final  . Sample Expiration 06/30/2017 07/10/2017   Final  . Extend sample reason 06/30/2017 NO TRANSFUSIONS OR PREGNANCY IN THE PAST 3 MONTHS   Final  . Color, Urine 06/30/2017 YELLOW  YELLOW Final  . APPearance 06/30/2017 CLEAR  CLEAR Final  . Specific Gravity, Urine 06/30/2017 1.014  1.005 - 1.030 Final  . pH 06/30/2017 7.0  5.0 - 8.0 Final  . Glucose, UA 06/30/2017 NEGATIVE  NEGATIVE mg/dL Final  . Hgb urine dipstick 06/30/2017 NEGATIVE  NEGATIVE Final  . Bilirubin Urine 06/30/2017 NEGATIVE  NEGATIVE Final  . Ketones, ur 06/30/2017 NEGATIVE  NEGATIVE mg/dL Final  . Protein, ur 06/30/2017 NEGATIVE  NEGATIVE mg/dL Final  . Nitrite 06/30/2017 NEGATIVE  NEGATIVE Final  . Leukocytes, UA 06/30/2017 NEGATIVE  NEGATIVE Final  . MRSA, PCR 06/30/2017 NEGATIVE  NEGATIVE Final  . Staphylococcus aureus 06/30/2017 NEGATIVE  NEGATIVE Final   Comment: (NOTE) The Xpert SA  Assay (FDA approved for NASAL specimens in patients 82 years of age and older), is one component of a comprehensive surveillance program. It is not intended to diagnose infection nor to guide or monitor treatment.   . ABO/RH(D) 06/30/2017 O POS   Final     X-Rays:Dg Chest 2 View  Result Date: 06/30/2017 CLINICAL DATA:  66 year old male under preoperative evaluation prior to knee arthroplasty. No current complaints. EXAM: CHEST  2 VIEW COMPARISON:  Chest x-ray 09/08/2016. FINDINGS: Lung volumes are normal. No consolidative airspace disease. No pleural effusions. No pneumothorax. No pulmonary nodule or mass noted. Pulmonary vasculature and the cardiomediastinal silhouette are within normal limits. Atherosclerosis in the thoracic aorta. Status post median sternotomy for CABG. IMPRESSION: 1. No radiographic evidence of acute cardiopulmonary disease. 2. Aortic atherosclerosis. Electronically Signed   By: Vinnie Langton M.D.   On: 06/30/2017 14:09   Dg Knee Left Port  Result Date: 07/07/2017 CLINICAL DATA:  Intraoperative knee replacement. EXAM: PORTABLE LEFT KNEE - 1-2 VIEW COMPARISON:  Earlier same day FINDINGS: Single intraoperative lateral view shows femoral and tibial components in position. No radiographically detectable complication. IMPRESSION: Total knee arthroplasty in progress. Femoral and tibial components in place. Electronically Signed   By: Nelson Chimes M.D.   On: 07/07/2017 13:19   Dg Knee Left Port  Result Date: 07/07/2017 CLINICAL DATA:  Intraoperative knee replacement EXAM: PORTABLE LEFT KNEE - 1-2 VIEW COMPARISON:  None. FINDINGS: Tibial portion of the prosthesis is noted in place. The femoral component of the prosthesis is  not present at this time. Correlate with the operative findings. IMPRESSION: Intraoperative knee replacement. Electronically Signed   By: Inez Catalina M.D.   On: 07/07/2017 12:50    EKG: Orders placed or performed in visit on 03/18/17  . EKG 12-Lead      Hospital Course: [name] [name] is a 66 y.o. who was admitted to [facility]. They were brought to the operating room on 07/07/2017 and underwent Procedure(s): LEFT TOTAL KNEE ARTHROPLASTY.  Patient tolerated the procedure well and was later transferred to the recovery room and then to the orthopaedic floor for postoperative care.  They were given PO and IV analgesics for pain control following their surgery.  They were given 24 hours of postoperative antibiotics of  Anti-infectives    Start     Dose/Rate Route Frequency Ordered Stop   07/07/17 1800  ceFAZolin (ANCEF) IVPB 1 g/50 mL premix     1 g 100 mL/hr over 30 Minutes Intravenous Every 6 hours 07/07/17 1558 07/08/17 0053   07/07/17 1310  polymyxin B 500,000 Units, bacitracin 50,000 Units in sodium chloride 0.9 % 500 mL irrigation  Status:  Discontinued       As needed 07/07/17 1333 07/07/17 1348   07/07/17 0849  ceFAZolin (ANCEF) IVPB 2g/100 mL premix     2 g 200 mL/hr over 30 Minutes Intravenous On call to O.R. 07/07/17 7782 07/07/17 1125     and started on DVT prophylaxis in the form of Eliquis.   PT and OT were ordered for total joint protocol.  Discharge planning consulted to help with postop disposition and equipment needs.  Patient had a fair night on the evening of surgery.  They started to get up OOB with therapy on day one. Hemovac drain was pulled without difficulty.  Continued to work with therapy into day two.  Dressing was changed on day two and the incision was clean and dry.  The patient had progressed with therapy and meeting their goals.  Incision was healing well.  Patient was seen in rounds and was ready to go home.   Diet: Cardiac diet Activity:WBAT Follow-up:in 2 weeks Disposition - Home Discharged Condition: stable   Discharge Instructions    Call MD / Call 911    Complete by:  As directed    If you experience chest pain or shortness of breath, CALL 911 and be transported to the hospital emergency  room.  If you develope a fever above 101 F, pus (white drainage) or increased drainage or redness at the wound, or calf pain, call your surgeon's office.   Constipation Prevention    Complete by:  As directed    Drink plenty of fluids.  Prune juice may be helpful.  You may use a stool softener, such as Colace (over the counter) 100 mg twice a day.  Use MiraLax (over the counter) for constipation as needed.   Diet - low sodium heart healthy    Complete by:  As directed    Discharge instructions    Complete by:  As directed    INSTRUCTIONS AFTER JOINT REPLACEMENT   Remove items at home which could result in a fall. This includes throw rugs or furniture in walking pathways ICE to the affected joint every three hours while awake for 30 minutes at a time, for at least the first 3-5 days, and then as needed for pain and swelling.  Continue to use ice for pain and swelling. You may notice swelling that will progress  down to the foot and ankle.  This is normal after surgery.  Elevate your leg when you are not up walking on it.   Continue to use the breathing machine you got in the hospital (incentive spirometer) which will help keep your temperature down.  It is common for your temperature to cycle up and down following surgery, especially at night when you are not up moving around and exerting yourself.  The breathing machine keeps your lungs expanded and your temperature down.   DIET:  As you were doing prior to hospitalization, we recommend a well-balanced diet.  DRESSING / WOUND CARE / SHOWERING  You may change your dressing every day with sterile gauze.  Please use good hand washing techniques before changing the dressing.  Do not use any lotions or creams on the incision until instructed by your surgeon.  ACTIVITY  Increase activity slowly as tolerated, but follow the weight bearing instructions below.   No driving for 6 weeks or until further direction given by your physician.  You cannot  drive while taking narcotics.  No lifting or carrying greater than 10 lbs. until further directed by your surgeon. Avoid periods of inactivity such as sitting longer than an hour when not asleep. This helps prevent blood clots.  You may return to work once you are authorized by your doctor.     WEIGHT BEARING   Weight bearing as tolerated with assist device (walker, cane, etc) as directed, use it as long as suggested by your surgeon or therapist, typically at least 4-6 weeks.   EXERCISES  Results after joint replacement surgery are often greatly improved when you follow the exercise, range of motion and muscle strengthening exercises prescribed by your doctor. Safety measures are also important to protect the joint from further injury. Any time any of these exercises cause you to have increased pain or swelling, decrease what you are doing until you are comfortable again and then slowly increase them. If you have problems or questions, call your caregiver or physical therapist for advice.   Rehabilitation is important following a joint replacement. After just a few days of immobilization, the muscles of the leg can become weakened and shrink (atrophy).  These exercises are designed to build up the tone and strength of the thigh and leg muscles and to improve motion. Often times heat used for twenty to thirty minutes before working out will loosen up your tissues and help with improving the range of motion but do not use heat for the first two weeks following surgery (sometimes heat can increase post-operative swelling).   These exercises can be done on a training (exercise) mat, on the floor, on a table or on a bed. Use whatever works the best and is most comfortable for you.    Use music or television while you are exercising so that the exercises are a pleasant break in your day. This will make your life better with the exercises acting as a break in your routine that you can look forward to.    Perform all exercises about fifteen times, three times per day or as directed.  You should exercise both the operative leg and the other leg as well.  Exercises include:   Quad Sets - Tighten up the muscle on the front of the thigh (Quad) and hold for 5-10 seconds.   Straight Leg Raises - With your knee straight (if you were given a brace, keep it on), lift the leg to 60 degrees, hold  for 3 seconds, and slowly lower the leg.  Perform this exercise against resistance later as your leg gets stronger.  Leg Slides: Lying on your back, slowly slide your foot toward your buttocks, bending your knee up off the floor (only go as far as is comfortable). Then slowly slide your foot back down until your leg is flat on the floor again.  Angel Wings: Lying on your back spread your legs to the side as far apart as you can without causing discomfort.  Hamstring Strength:  Lying on your back, push your heel against the floor with your leg straight by tightening up the muscles of your buttocks.  Repeat, but this time bend your knee to a comfortable angle, and push your heel against the floor.  You may put a pillow under the heel to make it more comfortable if necessary.   A rehabilitation program following joint replacement surgery can speed recovery and prevent re-injury in the future due to weakened muscles. Contact your doctor or a physical therapist for more information on knee rehabilitation.    CONSTIPATION  Constipation is defined medically as fewer than three stools per week and severe constipation as less than one stool per week.  Even if you have a regular bowel pattern at home, your normal regimen is likely to be disrupted due to multiple reasons following surgery.  Combination of anesthesia, postoperative narcotics, change in appetite and fluid intake all can affect your bowels.   YOU MUST use at least one of the following options; they are listed in order of increasing strength to get the job done.   They are all available over the counter, and you may need to use some, POSSIBLY even all of these options:    Drink plenty of fluids (prune juice may be helpful) and high fiber foods Colace 100 mg by mouth twice a day  Senokot for constipation as directed and as needed Dulcolax (bisacodyl), take with full glass of water  Miralax (polyethylene glycol) once or twice a day as needed.  If you have tried all these things and are unable to have a bowel movement in the first 3-4 days after surgery call either your surgeon or your primary doctor.    If you experience loose stools or diarrhea, hold the medications until you stool forms back up.  If your symptoms do not get better within 1 week or if they get worse, check with your doctor.  If you experience "the worst abdominal pain ever" or develop nausea or vomiting, please contact the office immediately for further recommendations for treatment.   ITCHING:  If you experience itching with your medications, try taking only a single pain pill, or even half a pain pill at a time.  You can also use Benadryl over the counter for itching or also to help with sleep.   TED HOSE STOCKINGS:  Use stockings on both legs until for at least 2 weeks or as directed by physician office. They may be removed at night for sleeping.  MEDICATIONS:  See your medication summary on the "After Visit Summary" that nursing will review with you.  You may have some home medications which will be placed on hold until you complete the course of blood thinner medication.  It is important for you to complete the blood thinner medication as prescribed.  PRECAUTIONS:  If you experience chest pain or shortness of breath - call 911 immediately for transfer to the hospital emergency department.   If you develop  a fever greater that 101 F, purulent drainage from wound, increased redness or drainage from wound, foul odor from the wound/dressing, or calf pain - CONTACT YOUR SURGEON.                                                    FOLLOW-UP APPOINTMENTS:  If you do not already have a post-op appointment, please call the office for an appointment to be seen by your surgeon.  Guidelines for how soon to be seen are listed in your "After Visit Summary", but are typically between 1-4 weeks after surgery.   MAKE SURE YOU:  Understand these instructions.  Get help right away if you are not doing well or get worse.    Thank you for letting us be a part of your medical care team.  It is a privilege we respect greatly.  We hope these instructions will help you stay on track for a fast and full recovery!   Increase activity slowly as tolerated    Complete by:  As directed      Allergies as of 07/09/2017      Reactions   Nsaids Other (See Comments)   On blood thinner   Vancomycin Other (See Comments)   Was informed after heart surgery ? Breathing problem/patient unsure      Medication List    STOP taking these medications   acetaminophen 650 MG CR tablet Commonly known as:  TYLENOL     TAKE these medications   apixaban 5 MG Tabs tablet Commonly known as:  ELIQUIS Take 1 tablet (5 mg total) by mouth 2 (two) times daily.   atorvastatin 20 MG tablet Commonly known as:  LIPITOR Take 1 tablet (20 mg total) by mouth daily.   chlorthalidone 25 MG tablet Commonly known as:  HYGROTON Take 37.5 mg by mouth daily.   Co Q 10 100 MG Caps Take 100 mg by mouth daily.   diltiazem 360 MG 24 hr capsule Commonly known as:  CARDIZEM CD Take 1 capsule (360 mg total) by mouth daily. Notes to patient:  Resume afternoon 10/5   ferrous sulfate 325 (65 FE) MG tablet Take 1 tablet (325 mg total) by mouth 2 (two) times daily with a meal.   fish oil-omega-3 fatty acids 1000 MG capsule Take 1 g by mouth 2 (two) times daily. Notes to patient:  Home regimen   irbesartan 300 MG tablet Commonly known as:  AVAPRO Take 1 tablet (300 mg total) by mouth at bedtime.   methocarbamol 500 MG  tablet Commonly known as:  ROBAXIN Take 1 tablet (500 mg total) by mouth every 6 (six) hours as needed for muscle spasms. Notes to patient:  Muscle relaxer   metoprolol tartrate 25 MG tablet Commonly known as:  LOPRESSOR Take 1/2 tablet (12.5 mg) by mouth twice daily  AS NEEDED   multivitamin tablet Take 1 tablet by mouth daily.   omeprazole 20 MG capsule Commonly known as:  PRILOSEC Take 20 mg by mouth every other day.   oxyCODONE-acetaminophen 5-325 MG tablet Commonly known as:  PERCOCET/ROXICET Take 1-2 tablets by mouth every 4 (four) hours as needed for moderate pain.   PRESCRIPTION MEDICATION Pt uses CPAP machine at bedtime   sertraline 25 MG tablet Commonly known as:  ZOLOFT Take 1 tablet (25 mg total) by mouth daily.  Follow-up Information    Latanya Maudlin, MD. Schedule an appointment as soon as possible for a visit in 2 week(s).   Specialty:  Orthopedic Surgery Contact information: [address] [place] [address] [phone]        [facility] [place] Follow up.   Why:  For home health physical therapy. [phone] Contact information: [place] [id number] [phone]           Signed: Arlee Muslim, PA-C Orthopaedic Surgery 07/12/2017, 7:13 AM

## 2017-07-14 DIAGNOSIS — G4733 Obstructive sleep apnea (adult) (pediatric): Secondary | ICD-10-CM | POA: Diagnosis not present

## 2017-07-14 DIAGNOSIS — I110 Hypertensive heart disease with heart failure: Secondary | ICD-10-CM | POA: Diagnosis not present

## 2017-07-14 DIAGNOSIS — I509 Heart failure, unspecified: Secondary | ICD-10-CM | POA: Diagnosis not present

## 2017-07-14 DIAGNOSIS — R69 Illness, unspecified: Secondary | ICD-10-CM | POA: Diagnosis not present

## 2017-07-14 DIAGNOSIS — I2510 Atherosclerotic heart disease of native coronary artery without angina pectoris: Secondary | ICD-10-CM | POA: Diagnosis not present

## 2017-07-14 DIAGNOSIS — Z471 Aftercare following joint replacement surgery: Secondary | ICD-10-CM | POA: Diagnosis not present

## 2017-07-14 DIAGNOSIS — I480 Paroxysmal atrial fibrillation: Secondary | ICD-10-CM | POA: Diagnosis not present

## 2017-07-15 ENCOUNTER — Ambulatory Visit: Payer: Medicare HMO | Admitting: Pulmonary Disease

## 2017-07-19 DIAGNOSIS — Z471 Aftercare following joint replacement surgery: Secondary | ICD-10-CM | POA: Diagnosis not present

## 2017-07-19 DIAGNOSIS — I509 Heart failure, unspecified: Secondary | ICD-10-CM | POA: Diagnosis not present

## 2017-07-19 DIAGNOSIS — R69 Illness, unspecified: Secondary | ICD-10-CM | POA: Diagnosis not present

## 2017-07-19 DIAGNOSIS — I110 Hypertensive heart disease with heart failure: Secondary | ICD-10-CM | POA: Diagnosis not present

## 2017-07-19 DIAGNOSIS — I480 Paroxysmal atrial fibrillation: Secondary | ICD-10-CM | POA: Diagnosis not present

## 2017-07-19 DIAGNOSIS — I2510 Atherosclerotic heart disease of native coronary artery without angina pectoris: Secondary | ICD-10-CM | POA: Diagnosis not present

## 2017-07-20 DIAGNOSIS — Z96652 Presence of left artificial knee joint: Secondary | ICD-10-CM | POA: Diagnosis not present

## 2017-07-21 DIAGNOSIS — I110 Hypertensive heart disease with heart failure: Secondary | ICD-10-CM | POA: Diagnosis not present

## 2017-07-21 DIAGNOSIS — I509 Heart failure, unspecified: Secondary | ICD-10-CM | POA: Diagnosis not present

## 2017-07-21 DIAGNOSIS — R69 Illness, unspecified: Secondary | ICD-10-CM | POA: Diagnosis not present

## 2017-07-21 DIAGNOSIS — Z471 Aftercare following joint replacement surgery: Secondary | ICD-10-CM | POA: Diagnosis not present

## 2017-07-21 DIAGNOSIS — I480 Paroxysmal atrial fibrillation: Secondary | ICD-10-CM | POA: Diagnosis not present

## 2017-07-21 DIAGNOSIS — I2510 Atherosclerotic heart disease of native coronary artery without angina pectoris: Secondary | ICD-10-CM | POA: Diagnosis not present

## 2017-07-23 DIAGNOSIS — I2510 Atherosclerotic heart disease of native coronary artery without angina pectoris: Secondary | ICD-10-CM | POA: Diagnosis not present

## 2017-07-23 DIAGNOSIS — I509 Heart failure, unspecified: Secondary | ICD-10-CM | POA: Diagnosis not present

## 2017-07-23 DIAGNOSIS — I480 Paroxysmal atrial fibrillation: Secondary | ICD-10-CM | POA: Diagnosis not present

## 2017-07-23 DIAGNOSIS — I110 Hypertensive heart disease with heart failure: Secondary | ICD-10-CM | POA: Diagnosis not present

## 2017-07-23 DIAGNOSIS — Z471 Aftercare following joint replacement surgery: Secondary | ICD-10-CM | POA: Diagnosis not present

## 2017-07-23 DIAGNOSIS — R69 Illness, unspecified: Secondary | ICD-10-CM | POA: Diagnosis not present

## 2017-07-26 DIAGNOSIS — R69 Illness, unspecified: Secondary | ICD-10-CM | POA: Diagnosis not present

## 2017-07-26 DIAGNOSIS — I110 Hypertensive heart disease with heart failure: Secondary | ICD-10-CM | POA: Diagnosis not present

## 2017-07-26 DIAGNOSIS — I509 Heart failure, unspecified: Secondary | ICD-10-CM | POA: Diagnosis not present

## 2017-07-26 DIAGNOSIS — Z471 Aftercare following joint replacement surgery: Secondary | ICD-10-CM | POA: Diagnosis not present

## 2017-07-26 DIAGNOSIS — I2510 Atherosclerotic heart disease of native coronary artery without angina pectoris: Secondary | ICD-10-CM | POA: Diagnosis not present

## 2017-07-26 DIAGNOSIS — I480 Paroxysmal atrial fibrillation: Secondary | ICD-10-CM | POA: Diagnosis not present

## 2017-07-28 DIAGNOSIS — I110 Hypertensive heart disease with heart failure: Secondary | ICD-10-CM | POA: Diagnosis not present

## 2017-07-28 DIAGNOSIS — I509 Heart failure, unspecified: Secondary | ICD-10-CM | POA: Diagnosis not present

## 2017-07-28 DIAGNOSIS — R69 Illness, unspecified: Secondary | ICD-10-CM | POA: Diagnosis not present

## 2017-07-28 DIAGNOSIS — I480 Paroxysmal atrial fibrillation: Secondary | ICD-10-CM | POA: Diagnosis not present

## 2017-07-28 DIAGNOSIS — Z471 Aftercare following joint replacement surgery: Secondary | ICD-10-CM | POA: Diagnosis not present

## 2017-07-28 DIAGNOSIS — I2510 Atherosclerotic heart disease of native coronary artery without angina pectoris: Secondary | ICD-10-CM | POA: Diagnosis not present

## 2017-08-02 DIAGNOSIS — Z471 Aftercare following joint replacement surgery: Secondary | ICD-10-CM | POA: Diagnosis not present

## 2017-08-02 DIAGNOSIS — Z96652 Presence of left artificial knee joint: Secondary | ICD-10-CM | POA: Diagnosis not present

## 2017-08-16 ENCOUNTER — Encounter: Payer: Self-pay | Admitting: Pulmonary Disease

## 2017-08-16 ENCOUNTER — Ambulatory Visit: Payer: Medicare HMO | Admitting: Pulmonary Disease

## 2017-08-16 VITALS — BP 132/72 | HR 53 | Ht 66.0 in | Wt 253.2 lb

## 2017-08-16 DIAGNOSIS — I10 Essential (primary) hypertension: Secondary | ICD-10-CM | POA: Diagnosis not present

## 2017-08-16 DIAGNOSIS — Z96652 Presence of left artificial knee joint: Secondary | ICD-10-CM | POA: Diagnosis not present

## 2017-08-16 DIAGNOSIS — G4733 Obstructive sleep apnea (adult) (pediatric): Secondary | ICD-10-CM | POA: Diagnosis not present

## 2017-08-16 DIAGNOSIS — Z471 Aftercare following joint replacement surgery: Secondary | ICD-10-CM | POA: Diagnosis not present

## 2017-08-16 NOTE — Assessment & Plan Note (Signed)
Well controlled 

## 2017-08-16 NOTE — Assessment & Plan Note (Signed)
  CPAP is set at 13 cm and seems to be working well. CPAP supplies will be renewed for a year.  Weight loss encouraged, compliance with goal of at least 4-6 hrs every night is the expectation. Advised against medications with sedative side effects Cautioned against driving when sleepy - understanding that sleepiness will vary on a day to day basis

## 2017-08-16 NOTE — Patient Instructions (Signed)
CPAP is set at 13 cm & is working well CPAP supplies will be renewed x 1 year

## 2017-08-16 NOTE — Progress Notes (Signed)
   Subjective:    Patient ID: [name] [name], male    DOB: 1951/08/01, 66 y.o.   MRN: [id number]  HPI  66 year old man for follow-up of severe OSA. 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  He obtained a new machine in 2017 and really loves it. He is very compliant with his machine and uses it 6-8 hours every night.  Denies any problems with mask or pressure.  He does complain of mild dryness and keeps a glass of water by his bedside. He has noticed good improvement in his daytime somnolence and fatigue ever since he started on CPAP.  CPAP download was reviewed and this shows excellent control of events on 13 cm with minimal leak.  Compliance is excellent at close to 8 hours every night  He underwent right knee replacement and surgery was uneventful, anesthesia was careful about his OSA history His weight is unchanged and A. fib is controlled, he remains on anticoagulation  Significant tests/ events   PSG 09/2010 RDI 80/h Auto 12/2010:  Optimal cpap 14cm, great compliance  Review of Systems Patient denies significant dyspnea,cough, hemoptysis,  chest pain, palpitations, pedal edema, orthopnea, paroxysmal nocturnal dyspnea, lightheadedness, nausea, vomiting, abdominal or  leg pains      Objective:   Physical Exam   Gen. Pleasant, obese, in no distress ENT - no lesions, no post nasal drip Neck: No JVD, no thyromegaly, no carotid bruits Lungs: no use of accessory muscles, no dullness to percussion, decreased without rales or rhonchi  Cardiovascular: Rhythm regular, heart sounds  normal, no murmurs or gallops, no peripheral edema Musculoskeletal: No deformities, no cyanosis or clubbing , no tremors        Assessment & Plan:

## 2017-08-17 DIAGNOSIS — G4733 Obstructive sleep apnea (adult) (pediatric): Secondary | ICD-10-CM | POA: Diagnosis not present

## 2017-08-18 ENCOUNTER — Telehealth: Payer: Self-pay | Admitting: Internal Medicine

## 2017-08-18 NOTE — Telephone Encounter (Signed)
New message    Patient calling receive his Dec recall letter.  Patient stating he doing fine since the ablation and recent knee replacement .  Wants to know should he make an appt or call back when he having issues.

## 2017-08-19 NOTE — Telephone Encounter (Signed)
Spoke with patient about December recall appointment and encouraged him to schedule his follow up.

## 2017-08-24 ENCOUNTER — Other Ambulatory Visit: Payer: Self-pay | Admitting: *Deleted

## 2017-08-24 MED ORDER — CHLORTHALIDONE 25 MG PO TABS: 37.5000 mg | ORAL_TABLET | Freq: Every day | ORAL | 1 refills | Status: DC

## 2017-09-06 DIAGNOSIS — Z471 Aftercare following joint replacement surgery: Secondary | ICD-10-CM | POA: Diagnosis not present

## 2017-09-06 DIAGNOSIS — Z96652 Presence of left artificial knee joint: Secondary | ICD-10-CM | POA: Diagnosis not present

## 2017-09-07 ENCOUNTER — Other Ambulatory Visit: Payer: Self-pay | Admitting: *Deleted

## 2017-09-07 ENCOUNTER — Other Ambulatory Visit: Payer: Self-pay

## 2017-09-07 MED ORDER — APIXABAN 5 MG PO TABS: 5.0000 mg | ORAL_TABLET | Freq: Two times a day (BID) | ORAL | 3 refills | Status: DC

## 2017-09-07 MED ORDER — IRBESARTAN 300 MG PO TABS: 300.0000 mg | ORAL_TABLET | Freq: Every day | ORAL | 3 refills | Status: DC

## 2017-09-07 MED ORDER — APIXABAN 5 MG PO TABS
5.0000 mg | ORAL_TABLET | Freq: Two times a day (BID) | ORAL | 3 refills | Status: DC
Start: 2017-09-07 — End: 2017-09-07

## 2017-09-07 MED ORDER — ATORVASTATIN CALCIUM 20 MG PO TABS: 20.0000 mg | ORAL_TABLET | Freq: Every day | ORAL | 3 refills | Status: DC

## 2017-09-24 DIAGNOSIS — Z471 Aftercare following joint replacement surgery: Secondary | ICD-10-CM | POA: Diagnosis not present

## 2017-09-24 DIAGNOSIS — Z96652 Presence of left artificial knee joint: Secondary | ICD-10-CM | POA: Diagnosis not present

## 2017-09-29 DIAGNOSIS — G8929 Other chronic pain: Secondary | ICD-10-CM | POA: Insufficient documentation

## 2017-09-29 DIAGNOSIS — R609 Edema, unspecified: Secondary | ICD-10-CM | POA: Insufficient documentation

## 2017-09-29 DIAGNOSIS — I10 Essential (primary) hypertension: Secondary | ICD-10-CM | POA: Insufficient documentation

## 2017-09-29 DIAGNOSIS — R079 Chest pain, unspecified: Secondary | ICD-10-CM

## 2017-09-29 DIAGNOSIS — G4730 Sleep apnea, unspecified: Secondary | ICD-10-CM | POA: Insufficient documentation

## 2017-09-29 DIAGNOSIS — I480 Paroxysmal atrial fibrillation: Secondary | ICD-10-CM | POA: Insufficient documentation

## 2017-09-29 DIAGNOSIS — R002 Palpitations: Secondary | ICD-10-CM | POA: Insufficient documentation

## 2017-09-29 DIAGNOSIS — I2510 Atherosclerotic heart disease of native coronary artery without angina pectoris: Secondary | ICD-10-CM | POA: Insufficient documentation

## 2017-09-29 DIAGNOSIS — M4800 Spinal stenosis, site unspecified: Secondary | ICD-10-CM | POA: Insufficient documentation

## 2017-09-29 DIAGNOSIS — M1990 Unspecified osteoarthritis, unspecified site: Secondary | ICD-10-CM | POA: Insufficient documentation

## 2017-09-29 DIAGNOSIS — E785 Hyperlipidemia, unspecified: Secondary | ICD-10-CM | POA: Insufficient documentation

## 2017-09-29 DIAGNOSIS — I483 Typical atrial flutter: Secondary | ICD-10-CM | POA: Insufficient documentation

## 2017-09-29 DIAGNOSIS — R9431 Abnormal electrocardiogram [ECG] [EKG]: Secondary | ICD-10-CM | POA: Insufficient documentation

## 2017-10-06 DIAGNOSIS — G4733 Obstructive sleep apnea (adult) (pediatric): Secondary | ICD-10-CM | POA: Diagnosis not present

## 2017-10-20 ENCOUNTER — Ambulatory Visit: Payer: Medicare HMO | Admitting: Internal Medicine

## 2017-10-20 ENCOUNTER — Encounter: Payer: Self-pay | Admitting: Internal Medicine

## 2017-10-20 VITALS — BP 144/86 | HR 62 | Ht 66.0 in | Wt 257.0 lb

## 2017-10-20 DIAGNOSIS — I480 Paroxysmal atrial fibrillation: Secondary | ICD-10-CM

## 2017-10-20 DIAGNOSIS — I2510 Atherosclerotic heart disease of native coronary artery without angina pectoris: Secondary | ICD-10-CM | POA: Diagnosis not present

## 2017-10-20 NOTE — Patient Instructions (Signed)
Medication Instructions:  Your physician recommends that you continue on your current medications as directed. Please refer to the Current Medication list given to you today.   Labwork: None ordered.  Testing/Procedures: None ordered.  Follow-Up: Your physician recommends that you schedule a follow-up appointment as needed with Dr Allred.   Any Other Special Instructions Will Be Listed Below (If Applicable).     If you need a refill on your cardiac medications before your next appointment, please call your pharmacy.   

## 2017-10-20 NOTE — Progress Notes (Signed)
PCP: Patient, No Pcp Per Primary Cardiologist: Dr Wyline Mood Primary EP: Dr Johney Frame  [name] [name] is a 67 y.o. male who presents today for routine electrophysiology followup.  Since last being seen in our clinic, the patient reports doing very well.   Feels that his afib has been well controlled.  He has had knee surgery within the past 6 months.  He is now able to better exercise without limitation. Today, he denies symptoms of palpitations, chest pain, shortness of breath,  lower extremity edema, dizziness, presyncope, or syncope.  The patient is otherwise without complaint today.   Past Medical History:  Diagnosis Date  . Anxiety   . Chronic chest pain   . Chronic sinus bradycardia   . Coronary artery disease    2v CABG, 2004 [facility])  . DJD (degenerative joint disease)   . Edema   . HLD (hyperlipidemia)   . Hypertension   . MI, old 2004  . OA (osteoarthritis)   . Obesity   . PAF (paroxysmal atrial fibrillation) (HCC)    a. started on sotalol 08/2016.  Marland Kitchen Palpitations    Reported history of PVCs  . QT prolongation   . Sleep apnea    on CPAP  . Spinal stenosis   . Typical atrial flutter [facility])    Past Surgical History:  Procedure Laterality Date  . BACK SURGERY  2011  . CARPAL TUNNEL RELEASE     bilateral  . CORONARY ARTERY BYPASS GRAFT     2004  . ELECTROPHYSIOLOGIC STUDY [name] 09/15/2016   Procedure: Atrial Fibrillation Ablation;  Surgeon: Hillis Range, MD;  Location: [facility] INVASIVE CV LAB;  Service: Cardiovascular;  Laterality: [name];  . fistula surgery    . hemorrhoidectomy    . LUMBAR LAMINECTOMY    . right foot fracture    . TEE WITHOUT CARDIOVERSION [name] 09/15/2016   Procedure: TRANSESOPHAGEAL ECHOCARDIOGRAM (TEE);  Surgeon: Lewayne Bunting, MD;  Location: [facility] ENDOSCOPY;  Service: Cardiovascular;  Laterality: [name];  . TOTAL KNEE ARTHROPLASTY Left 07/07/2017   Procedure: LEFT TOTAL KNEE ARTHROPLASTY;  Surgeon: Ranee Gosselin, MD;  Location: [facility] ORS;  Service: Orthopedics;   Laterality: Left;  . TRANSTHORACIC ECHOCARDIOGRAM  08/25/2010   EF 55-60%    ROS- all systems are reviewed and negatives except as per HPI above  Current Outpatient Medications  Medication Sig Dispense Refill  . apixaban (ELIQUIS) 5 MG TABS tablet Take 1 tablet (5 mg total) by mouth 2 (two) times daily. 180 tablet 3  . atorvastatin (LIPITOR) 20 MG tablet Take 1 tablet (20 mg total) by mouth daily. 90 tablet 3  . chlorthalidone (HYGROTON) 25 MG tablet Take 1.5 tablets (37.5 mg total) by mouth daily. 135 tablet 1  . Coenzyme Q10 (CO Q 10) 100 MG CAPS Take 100 mg by mouth daily.     Marland Kitchen diltiazem (CARDIZEM CD) 360 MG 24 hr capsule Take 1 capsule (360 mg total) by mouth daily. 90 capsule 3  . fish oil-omega-3 fatty acids 1000 MG capsule Take 1 g by mouth 2 (two) times daily.     . irbesartan (AVAPRO) 300 MG tablet Take 1 tablet (300 mg total) by mouth at bedtime. 90 tablet 3  . metoprolol tartrate (LOPRESSOR) 25 MG tablet Take 1/2 tablet (12.5 mg) by mouth twice daily  AS NEEDED 180 tablet 1  . Multiple Vitamin (MULTIVITAMIN) tablet Take 1 tablet by mouth daily.    Marland Kitchen omeprazole (PRILOSEC) 20 MG capsule Take 20 mg by mouth every other day.     Marland Kitchen  PRESCRIPTION MEDICATION Pt uses CPAP machine at bedtime    . sertraline (ZOLOFT) 25 MG tablet Take 1 tablet (25 mg total) by mouth daily. 90 tablet 3   No current facility-administered medications for this visit.     Physical Exam: Vitals:   10/20/17 1050  BP: (!) 144/86  Pulse: 62  Weight: 257 lb (116.6 kg)  Height: 5\' 6"  (1.676 m)    GEN- The patient is well appearing, alert and oriented x 3 today.   Head- normocephalic, atraumatic Eyes-  Sclera clear, conjunctiva pink Ears- hearing intact Oropharynx- clear Lungs- Clear to ausculation bilaterally, normal work of breathing Heart- Regular rate and rhythm, no murmurs, rubs or gallops, PMI not laterally displaced GI- soft, NT, ND, + BS Extremities- no clubbing, cyanosis, or edema  EKG  tracing ordered today is personally reviewed and shows sinus rhythm 62 bpm, incomplete RBBB, LVH  Assessment and Plan:  1. Atrial fibrillation/ atrial flutter Well controlled post ablation off AAD therapy On eliquis for chads2vasc score of at least 2  2. Hypertensive cardiovascular disease Stable No change required today  3. Obesity Body mass index is 41.48 kg/m. Lifestyle modification again encouraged  4. OSA Compliance with CPAP encouraged  5. CAD No ischemic symptoms  6. Anxiety He asks that I refill in Zoloft.  As I have told him previously, neither Dr Wyline Mood nor I will fill this.  I was very clear with him previously and again today that he needs to establish with primary care regarding this and other issues.  Unfortunately, he does not wish to seek primary care.  I have told him that I cannot fill his zoloft.  He does not like seeing doctors.  He wishes to see me as needed.  I worry that he will have additional afib without appropriate lifestyle change. Follow-up with Dr Wyline Mood as scheduled  Hillis Range MD, [facility] 10/20/2017 11:07 AM

## 2017-11-03 ENCOUNTER — Other Ambulatory Visit: Payer: Self-pay | Admitting: *Deleted

## 2017-11-03 MED ORDER — OMEPRAZOLE 20 MG PO CPDR: 20.0000 mg | DELAYED_RELEASE_CAPSULE | ORAL | 1 refills | Status: DC

## 2017-11-03 MED ORDER — SERTRALINE HCL 25 MG PO TABS: 25.0000 mg | ORAL_TABLET | Freq: Every day | ORAL | 3 refills | Status: DC

## 2017-11-03 MED ORDER — IRBESARTAN 300 MG PO TABS: 300.0000 mg | ORAL_TABLET | Freq: Every day | ORAL | 1 refills | Status: DC

## 2017-11-03 MED ORDER — DILTIAZEM HCL ER COATED BEADS 360 MG PO CP24: 360.0000 mg | ORAL_CAPSULE | Freq: Every day | ORAL | 1 refills | Status: DC

## 2017-11-03 MED ORDER — METOPROLOL TARTRATE 25 MG PO TABS: ORAL_TABLET | ORAL | 1 refills | Status: DC

## 2017-11-03 MED ORDER — ATORVASTATIN CALCIUM 20 MG PO TABS: 20.0000 mg | ORAL_TABLET | Freq: Every day | ORAL | 1 refills | Status: DC

## 2017-11-03 MED ORDER — APIXABAN 5 MG PO TABS: 5.0000 mg | ORAL_TABLET | Freq: Two times a day (BID) | ORAL | 1 refills | Status: DC

## 2017-11-03 MED ORDER — CHLORTHALIDONE 25 MG PO TABS: 37.5000 mg | ORAL_TABLET | Freq: Every day | ORAL | 1 refills | Status: DC

## 2018-02-04 ENCOUNTER — Inpatient Hospital Stay (HOSPITAL_COMMUNITY)
Admission: EM | Admit: 2018-02-04 | Discharge: 2018-02-07 | DRG: 287 | Disposition: A | Discharge status: Home / Self Care | Payer: Medicare HMO | Attending: Internal Medicine | Admitting: Internal Medicine

## 2018-02-04 ENCOUNTER — Encounter: Payer: Self-pay | Admitting: Internal Medicine

## 2018-02-04 ENCOUNTER — Emergency Department (HOSPITAL_COMMUNITY): Payer: Medicare HMO

## 2018-02-04 ENCOUNTER — Other Ambulatory Visit: Payer: Self-pay

## 2018-02-04 ENCOUNTER — Ambulatory Visit (INDEPENDENT_AMBULATORY_CARE_PROVIDER_SITE_OTHER): Payer: Medicare HMO | Admitting: Internal Medicine

## 2018-02-04 VITALS — BP 148/91 | HR 82 | Ht 66.0 in | Wt 249.4 lb

## 2018-02-04 DIAGNOSIS — I200 Unstable angina: Secondary | ICD-10-CM | POA: Diagnosis not present

## 2018-02-04 DIAGNOSIS — Z7901 Long term (current) use of anticoagulants: Secondary | ICD-10-CM

## 2018-02-04 DIAGNOSIS — K219 Gastro-esophageal reflux disease without esophagitis: Secondary | ICD-10-CM | POA: Diagnosis present

## 2018-02-04 DIAGNOSIS — G4733 Obstructive sleep apnea (adult) (pediatric): Secondary | ICD-10-CM

## 2018-02-04 DIAGNOSIS — I480 Paroxysmal atrial fibrillation: Secondary | ICD-10-CM

## 2018-02-04 DIAGNOSIS — Z951 Presence of aortocoronary bypass graft: Secondary | ICD-10-CM

## 2018-02-04 DIAGNOSIS — I483 Typical atrial flutter: Secondary | ICD-10-CM | POA: Diagnosis present

## 2018-02-04 DIAGNOSIS — R531 Weakness: Secondary | ICD-10-CM | POA: Diagnosis not present

## 2018-02-04 DIAGNOSIS — R69 Illness, unspecified: Secondary | ICD-10-CM | POA: Diagnosis not present

## 2018-02-04 DIAGNOSIS — I10 Essential (primary) hypertension: Secondary | ICD-10-CM | POA: Diagnosis present

## 2018-02-04 DIAGNOSIS — R0602 Shortness of breath: Secondary | ICD-10-CM | POA: Diagnosis not present

## 2018-02-04 DIAGNOSIS — R110 Nausea: Secondary | ICD-10-CM | POA: Diagnosis not present

## 2018-02-04 DIAGNOSIS — I119 Hypertensive heart disease without heart failure: Secondary | ICD-10-CM | POA: Diagnosis not present

## 2018-02-04 DIAGNOSIS — Z6841 Body Mass Index (BMI) 40.0 and over, adult: Secondary | ICD-10-CM | POA: Diagnosis not present

## 2018-02-04 DIAGNOSIS — I252 Old myocardial infarction: Secondary | ICD-10-CM | POA: Diagnosis not present

## 2018-02-04 DIAGNOSIS — E785 Hyperlipidemia, unspecified: Secondary | ICD-10-CM | POA: Diagnosis present

## 2018-02-04 DIAGNOSIS — Z96652 Presence of left artificial knee joint: Secondary | ICD-10-CM | POA: Diagnosis present

## 2018-02-04 DIAGNOSIS — Z8249 Family history of ischemic heart disease and other diseases of the circulatory system: Secondary | ICD-10-CM | POA: Diagnosis not present

## 2018-02-04 DIAGNOSIS — I25110 Atherosclerotic heart disease of native coronary artery with unstable angina pectoris: Secondary | ICD-10-CM | POA: Diagnosis not present

## 2018-02-04 DIAGNOSIS — F419 Anxiety disorder, unspecified: Secondary | ICD-10-CM | POA: Diagnosis present

## 2018-02-04 DIAGNOSIS — I481 Persistent atrial fibrillation: Secondary | ICD-10-CM | POA: Diagnosis not present

## 2018-02-04 DIAGNOSIS — E669 Obesity, unspecified: Secondary | ICD-10-CM | POA: Diagnosis present

## 2018-02-04 DIAGNOSIS — R079 Chest pain, unspecified: Secondary | ICD-10-CM | POA: Diagnosis not present

## 2018-02-04 LAB — CBC
HEMATOCRIT: 47 % (ref 39.0–52.0)
Hemoglobin: 16.3 g/dL (ref 13.0–17.0)
MCH: 29.7 pg (ref 26.0–34.0)
MCHC: 34.7 g/dL (ref 30.0–36.0)
MCV: 85.6 fL (ref 78.0–100.0)
PLATELETS: 370 10*3/uL (ref 150–400)
RBC: 5.49 MIL/uL (ref 4.22–5.81)
RDW: 13.9 % (ref 11.5–15.5)
WBC: 12.9 10*3/uL — ABNORMAL HIGH (ref 4.0–10.5)

## 2018-02-04 LAB — BASIC METABOLIC PANEL
Anion gap: 11 (ref 5–15)
BUN: 18 mg/dL (ref 6–20)
CHLORIDE: 103 mmol/L (ref 101–111)
CO2: 27 mmol/L (ref 22–32)
CREATININE: 1.26 mg/dL — AB (ref 0.61–1.24)
Calcium: 10.1 mg/dL (ref 8.9–10.3)
GFR calc Af Amer: >60 mL/min (ref >60)
GFR calc non Af Amer: 59 mL/min — ABNORMAL LOW (ref >60)
GLUCOSE: 101 mg/dL — AB (ref 65–99)
POTASSIUM: 4.7 mmol/L (ref 3.5–5.1)
Sodium: 141 mmol/L (ref 135–145)

## 2018-02-04 LAB — I-STAT TROPONIN, ED: Troponin i, poc: 0.02 ng/mL (ref 0.00–0.08)

## 2018-02-04 LAB — TROPONIN I: Troponin I: <0.03 ng/mL (ref <0.03)

## 2018-02-04 LAB — BRAIN NATRIURETIC PEPTIDE: B Natriuretic Peptide: 149.8 pg/mL — ABNORMAL HIGH (ref 0.0–100.0)

## 2018-02-04 LAB — HEPARIN LEVEL (UNFRACTIONATED): Heparin Unfractionated: >2.2 IU/mL — ABNORMAL HIGH (ref 0.30–0.70)

## 2018-02-04 LAB — APTT: aPTT: 35 seconds (ref 24–36)

## 2018-02-04 MED ORDER — IRBESARTAN 150 MG PO TABS
300.0000 mg | ORAL_TABLET | Freq: Every day | ORAL | Status: DC
  Administered 2018-02-04 – 2018-02-06 (×3): 300 mg via ORAL
  Filled 2018-02-04 (×3): qty 2

## 2018-02-04 MED ORDER — AMIODARONE HCL IN DEXTROSE 360-4.14 MG/200ML-% IV SOLN
30.0000 mg/h | INTRAVENOUS | Status: DC
  Administered 2018-02-04 – 2018-02-05 (×2): 30 mg/h via INTRAVENOUS
  Filled 2018-02-04 (×3): qty 200

## 2018-02-04 MED ORDER — ADULT MULTIVITAMIN W/MINERALS CH
1.0000 | ORAL_TABLET | Freq: Every day | ORAL | Status: DC
  Administered 2018-02-05 – 2018-02-07 (×3): 1 via ORAL
  Filled 2018-02-04 (×3): qty 1

## 2018-02-04 MED ORDER — METOPROLOL TARTRATE 12.5 MG HALF TABLET: 12.5000 mg | ORAL_TABLET | Freq: Two times a day (BID) | ORAL | Status: DC | PRN

## 2018-02-04 MED ORDER — CO Q 10 100 MG PO CAPS: 400.0000 mg | ORAL_CAPSULE | Freq: Every day | ORAL | Status: DC

## 2018-02-04 MED ORDER — SERTRALINE HCL 50 MG PO TABS
25.0000 mg | ORAL_TABLET | Freq: Every day | ORAL | Status: DC
  Administered 2018-02-05 – 2018-02-07 (×3): 25 mg via ORAL
  Filled 2018-02-04 (×3): qty 1

## 2018-02-04 MED ORDER — DILTIAZEM HCL ER COATED BEADS 180 MG PO CP24
360.0000 mg | ORAL_CAPSULE | Freq: Every day | ORAL | Status: DC
  Administered 2018-02-05 – 2018-02-07 (×3): 360 mg via ORAL
  Filled 2018-02-04 (×3): qty 2

## 2018-02-04 MED ORDER — ACETAMINOPHEN 325 MG PO TABS
650.0000 mg | ORAL_TABLET | ORAL | Status: DC | PRN
  Administered 2018-02-07 (×2): 650 mg via ORAL
  Filled 2018-02-04 (×2): qty 2

## 2018-02-04 MED ORDER — AMIODARONE HCL IN DEXTROSE 360-4.14 MG/200ML-% IV SOLN
60.0000 mg/h | Freq: Once | INTRAVENOUS | Status: AC
  Administered 2018-02-04: 60 mg/h via INTRAVENOUS
  Filled 2018-02-04: qty 200

## 2018-02-04 MED ORDER — ONDANSETRON HCL 4 MG/2ML IJ SOLN: 4.0000 mg | Freq: Four times a day (QID) | INTRAMUSCULAR | Status: DC | PRN

## 2018-02-04 MED ORDER — PANTOPRAZOLE SODIUM 40 MG PO TBEC
40.0000 mg | DELAYED_RELEASE_TABLET | Freq: Every day | ORAL | Status: DC
  Administered 2018-02-04 – 2018-02-07 (×4): 40 mg via ORAL
  Filled 2018-02-04 (×4): qty 1

## 2018-02-04 MED ORDER — NITROGLYCERIN 0.4 MG SL SUBL: 0.4000 mg | SUBLINGUAL_TABLET | SUBLINGUAL | Status: DC | PRN

## 2018-02-04 MED ORDER — ATORVASTATIN CALCIUM 20 MG PO TABS
20.0000 mg | ORAL_TABLET | Freq: Every day | ORAL | Status: DC
  Administered 2018-02-06: 20 mg via ORAL
  Filled 2018-02-04 (×2): qty 1

## 2018-02-04 MED ORDER — CHLORTHALIDONE 25 MG PO TABS
37.5000 mg | ORAL_TABLET | Freq: Every day | ORAL | Status: DC
  Administered 2018-02-05 – 2018-02-07 (×3): 37.5 mg via ORAL
  Filled 2018-02-04 (×3): qty 1.5

## 2018-02-04 MED ORDER — HEPARIN (PORCINE) IN NACL 100-0.45 UNIT/ML-% IJ SOLN
1400.0000 [IU]/h | INTRAMUSCULAR | Status: DC
  Administered 2018-02-04: 1300 [IU]/h via INTRAVENOUS
  Administered 2018-02-06 – 2018-02-07 (×2): 1400 [IU]/h via INTRAVENOUS
  Filled 2018-02-04 (×4): qty 250

## 2018-02-04 MED ORDER — OMEGA-3-ACID ETHYL ESTERS 1 G PO CAPS
1.0000 g | ORAL_CAPSULE | Freq: Two times a day (BID) | ORAL | Status: DC
  Administered 2018-02-04 – 2018-02-07 (×6): 1 g via ORAL
  Filled 2018-02-04 (×6): qty 1

## 2018-02-04 NOTE — Progress Notes (Signed)
Patient's home CPAP was set up at the bedside and is ready for use.

## 2018-02-04 NOTE — ED Triage Notes (Signed)
Pt arrives with family from PCP for evaluation of chest pain with afib. He is scheduled with a cath Monday. He has been in afib for several weeks. 6/10 at present to central chest.

## 2018-02-04 NOTE — ED Notes (Signed)
Patient taken to [facility] on telemetry

## 2018-02-04 NOTE — Patient Instructions (Addendum)
Medication Instructions:   Your physician recommends that you continue on your current medications as directed. Please refer to the Current Medication list given to you today.  Labwork:  NONE  Testing/Procedures:  NONE  Follow-Up:  Your physician recommends that you schedule a follow-up appointment in: to be determined after hospital visit.  Go to [facility] ED now.  Any Other Special Instructions Will Be Listed Below (If Applicable).  If you need a refill on your cardiac medications before your next appointment, please call your pharmacy.

## 2018-02-04 NOTE — Progress Notes (Addendum)
ANTICOAGULATION CONSULT NOTE - Initial Consult  Pharmacy Consult for Heparin Indication: atrial fibrillation  Allergies  Allergen Reactions  . Nsaids Other (See Comments)    On blood thinner  . Vancomycin Other (See Comments)    Was informed after heart surgery ? Breathing problem/patient unsure   Patient Measurements: Height: 5\' 6"  (167.6 cm) Weight: 249 lb (112.9 kg) IBW/kg (Calculated) : 63.8 Heparin Dosing Weight: 89.7 kg  Vital Signs: Temp: 97.8 F (36.6 C) (05/03 1505) Temp Source: Oral (05/03 1505) BP: 113/64 (05/03 1630) Pulse Rate: 70 (05/03 1630)  Labs: Recent Labs    02/04/18 1512  HGB 16.3  HCT 47.0  PLT 370  CREATININE 1.26*   Estimated Creatinine Clearance: 69.9 mL/min (A) (by C-G formula based on SCr of 1.26 mg/dL (H)).  Assessment: 71 yoM presenting with unstable angina, on Eliquis PTA for h/o afib. Plan for cardiac cath on Monday, 5/6. Pharmacy consulted to dose IV heparin in the interim. Last dose of Eliquis taken 5/3 @ 0730. Will dose IV heparin via aPTTs given impact of Eliquis on anti-Xa levels. CBC WNL, no bleeding noted.  Goal of Therapy:  Heparin level 0.3-0.7 units/ml Monitor platelets by anticoagulation protocol: Yes   Plan:  Will check baseline heparin level and aPTT now No heparin bolus Start heparin gtt at 1300 units/hr  Check heparin level and aPTT with AM labs Daily heparin level/aPTT and CBC Monitor for s/sx of bleeding  Jmari Pelc N. Zigmund Daniel, PharmD PGY1 Pharmacy Resident Pager: [phone] 02/04/2018,5:20 PM

## 2018-02-04 NOTE — ED Provider Notes (Signed)
[facility] EMERGENCY DEPARTMENT Provider Note   CSN: [id number] Arrival date & time: 02/04/18  1500     History   Chief Complaint Chief Complaint  Patient presents with  . Chest Pain  . Atrial Fibrillation    HPI [name] [name] is a 67 y.o. male.  HPI 2 weeks of chest pain, palpitations, dyspnea. He is here with his wife and daughter who assist with the HPI. They will note that the patient was generally in his usual state of health until about 2 weeks ago, now since that time, in spite of talking with his cardiology team, taking more beta-blocker, as needed for episodes of palpitations and chest pain, he remains intermittently symptomatic, without clear precipitating, alleviating, exacerbating factors beyond activity. He describes a heaviness across his anterior chest, as well as palpitations, particularly with exertion. He continues to take all medication as directed including Xarelto. He has a notable history of prior ablation for A. fib, but has persistent paroxysmal arrhythmia, which was previously controlled with beta-blocker. Patient has had no catheterization in greater than 10 years, no echocardiogram in about 18 months.  Past Medical History:  Diagnosis Date  . Anxiety   . Chronic chest pain   . Chronic sinus bradycardia   . Coronary artery disease    2v CABG, 2004 [facility])  . DJD (degenerative joint disease)   . Edema   . HLD (hyperlipidemia)   . Hypertension   . MI, old 2004  . OA (osteoarthritis)   . Obesity   . PAF (paroxysmal atrial fibrillation) (HCC)    a. started on sotalol 08/2016.  Marland Kitchen Palpitations    Reported history of PVCs  . QT prolongation   . Sleep apnea    on CPAP  . Spinal stenosis   . Typical atrial flutter [facility])     Patient Active Problem List   Diagnosis Date Noted  . Typical atrial flutter (HCC)   . Spinal stenosis   . Sleep apnea   . QT prolongation   . Palpitations   . PAF (paroxysmal atrial fibrillation)  (HCC)   . OA (osteoarthritis)   . Hypertension   . HLD (hyperlipidemia)   . Edema   . DJD (degenerative joint disease)   . Coronary artery disease   . Chronic chest pain   . 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 A-fib (HCC) 09/15/2016  . Chest pain 09/09/2016  . Obesity 08/22/2016  . Anxiety 08/22/2016  . Encounter for monitoring sotalol therapy 08/20/2016  . Morbidly obese (HCC) 07/12/2016  . Medical non-compliance 07/12/2016  . Elevated TSH 07/09/2016  . BPH without urinary obstruction 07/09/2016  . Need for hepatitis C screening test 07/09/2016  . Idiopathic gout 07/09/2016  . Hyperglycemia 08/01/2015  . Atrial fibrillation with RVR (HCC) 03/28/2015  . Routine general medical examination at a health care facility 08/11/2013  . Chronic sinus bradycardia   . Obstructive sleep apnea 09/12/2010  . MYOCARDIAL INFARCTION 09/11/2010  . Spinal stenosis, unspecified region other than cervical 09/11/2010  . Hyperlipidemia with target LDL less than 70 09/08/2010  . Depression 09/08/2010  . Essential hypertension 09/08/2010  . Coronary atherosclerosis 09/08/2010  . Congestive heart failure (HCC) 09/08/2010  . GERD 09/08/2010  . MI, old 10/05/2002    Past Surgical History:  Procedure Laterality Date  . BACK SURGERY  2011  . CARPAL TUNNEL RELEASE     bilateral  . CORONARY ARTERY BYPASS GRAFT     2004  .  ELECTROPHYSIOLOGIC STUDY [name] 09/15/2016   Procedure: Atrial Fibrillation Ablation;  Surgeon: Hillis Range, MD;  Location[facility] INVASIVE CV LAB;  Service: Cardiovascular;  Laterality: [name];  . fistula surgery    . hemorrhoidectomy    . LUMBAR LAMINECTOMY    . right foot fracture    . TEE WITHOUT CARDIOVERSION [name] 09/15/2016   Procedure: TRANSESOPHAGEAL ECHOCARDIOGRAM (TEE);  Surgeon: Lewayne Bunting, MD;  Location: [facility] ENDOSCOPY;  Service: Cardiovascular;  Laterality: [name];  . TOTAL KNEE ARTHROPLASTY Left 07/07/2017   Procedure: LEFT TOTAL KNEE ARTHROPLASTY;  Surgeon:  Ranee Gosselin, MD;  Location: [facility] ORS;  Service: Orthopedics;  Laterality: Left;  . TRANSTHORACIC ECHOCARDIOGRAM  08/25/2010   EF 55-60%        Home Medications    Prior to Admission medications   Medication Sig Start Date End Date Taking? Authorizing Provider  acetaminophen (TYLENOL) 650 MG CR tablet Take 650 mg by mouth every 8 (eight) hours as needed for pain.   Yes [provider]  apixaban (ELIQUIS) 5 MG TABS tablet Take 1 tablet (5 mg total) by mouth 2 (two) times daily. 11/03/17  Yes BranchDorothe Pea, MD  atorvastatin (LIPITOR) 20 MG tablet Take 1 tablet (20 mg total) by mouth daily. Patient taking differently: Take 20 mg by mouth daily at 6 PM.  11/03/17  Yes Branch, Dorothe Pea, MD  chlorthalidone (HYGROTON) 25 MG tablet Take 1.5 tablets (37.5 mg total) by mouth daily. 11/03/17  Yes BranchDorothe Pea, MD  Coenzyme Q10 (CO Q 10) 100 MG CAPS Take 400 mg by mouth daily.    Yes [provider]  diltiazem (CARDIZEM CD) 360 MG 24 hr capsule Take 1 capsule (360 mg total) by mouth daily. 11/03/17  Yes BranchDorothe Pea, MD  fish oil-omega-3 fatty acids 1000 MG capsule Take 1 g by mouth 2 (two) times daily.    Yes [provider]  irbesartan (AVAPRO) 300 MG tablet Take 1 tablet (300 mg total) by mouth at bedtime. 11/03/17 11/03/18 Yes Branch, Dorothe Pea, MD  metoprolol tartrate (LOPRESSOR) 25 MG tablet Take 12.5 mg by mouth 2 (two) times daily as needed (a-fib).    Yes [provider]  Multiple Vitamin (MULTIVITAMIN) tablet Take 1 tablet by mouth daily.   Yes [provider]  omeprazole (PRILOSEC) 20 MG capsule Take 1 capsule (20 mg total) by mouth every other day. 11/03/17  Yes BranchDorothe Pea, MD  PRESCRIPTION MEDICATION Pt uses CPAP machine at bedtime   Yes [provider]  sertraline (ZOLOFT) 25 MG tablet Take 1 tablet (25 mg total) by mouth daily. 11/03/17  Yes BranchDorothe Pea, MD    Family History Family History  Problem  Relation Age of Onset  . Breast cancer Mother   . Hypertension Mother   . Coronary artery disease Father   . Stroke Father   . Hypertension Father     Social History Social History   Tobacco Use  . Smoking status: Never Smoker  . Smokeless tobacco: Never Used  Substance Use Topics  . Alcohol use: No    Alcohol/week: 0.0 oz  . Drug use: No     Allergies   Nsaids and Vancomycin   Review of Systems Review of Systems  Constitutional:       Per HPI, otherwise negative  HENT:       Per HPI, otherwise negative  Respiratory:       Per HPI, otherwise negative  Cardiovascular:  Per HPI, otherwise negative  Gastrointestinal: Positive for nausea. Negative for vomiting.  Endocrine:       Negative aside from HPI  Genitourinary:       Neg aside from HPI   Musculoskeletal:       Per HPI, otherwise negative  Skin: Negative.   Neurological: Positive for weakness. Negative for syncope.     Physical Exam Updated Vital Signs BP 129/73   Pulse 70   Temp 97.8 F (36.6 C) (Oral)   Resp (!) 21   Ht 5\' 6"  (1.676 m)   Wt 112.9 kg (249 lb)   SpO2 98%   BMI 40.19 kg/m   Physical Exam  Constitutional: He is oriented to person, place, and time. He appears well-developed. No distress.  HENT:  Head: Normocephalic and atraumatic.  Eyes: Conjunctivae and EOM are normal.  Cardiovascular: Normal rate. An irregularly irregular rhythm present.  Pulmonary/Chest: Effort normal. No stridor. No respiratory distress.  Abdominal: He exhibits no distension. There is no tenderness.  Musculoskeletal: He exhibits no edema.  Neurological: He is alert and oriented to person, place, and time.  Skin: Skin is warm and dry.  Psychiatric: He has a normal mood and affect.  Nursing note and vitals reviewed.    ED Treatments / Results  Labs (all labs ordered are listed, but only abnormal results are displayed) Labs Reviewed  BASIC METABOLIC PANEL - Abnormal; Notable for the following  components:      Result Value   Glucose, Bld 101 (*)    Creatinine, Ser 1.26 (*)    GFR calc non Af Amer 59 (*)    All other components within normal limits  CBC - Abnormal; Notable for the following components:   WBC 12.9 (*)    All other components within normal limits  BRAIN NATRIURETIC PEPTIDE  I-STAT TROPONIN, ED    EKG EKG Interpretation  Date/Time:  Friday Feb 04 2018 15:05:47 EDT Ventricular Rate:  75 PR Interval:    QRS Duration: 96 QT Interval:  406 QTC Calculation: 453 R Axis:   40 Text Interpretation:  Atrial flutter with variable A-V block Incomplete right bundle branch block ST-t wave abnormality Abnormal ekg Confirmed by Gerhard Munch [phone]) on 02/04/2018 3:07:58 PM   Radiology Dg Chest 2 View  Result Date: 02/04/2018 CLINICAL DATA:  Chest pain and shortness of breath. History of atrial fibrillation. EXAM: CHEST - 2 VIEW COMPARISON:  Chest x-ray dated June 30, 2017. FINDINGS: Stable mild cardiomegaly status post CABG. Normal pulmonary vascularity. Minimal lower lobe subsegmental atelectasis bilaterally. No focal consolidation, pleural effusion, or pneumothorax. No acute osseous abnormality. IMPRESSION: No active cardiopulmonary disease. Electronically Signed   By: Obie Dredge M.D.   On: 02/04/2018 15:43    Procedures Procedures (including critical care time)  Medications Ordered in ED Medications  amiodarone (NEXTERONE PREMIX) 360-4.14 MG/200ML-% (1.8 mg/mL) IV infusion (has no administration in time range)     Initial Impression / Assessment and Plan / ED Course  I have reviewed the triage vital signs and the nursing notes.  Pertinent labs & imaging results that were available during my care of the patient were reviewed by me and considered in my medical decision making (see chart for details).  5:18 PM Spoke with patient's cardiologist. Given concern for unstable angina, and the patient's intolerance of his episodic A. fib, patient is  scheduled for catheterization in 2 days. Patient has been anticoagulated, with Eliquis, this will be held, and he is starting a heparin drip  for concern of unstable angina.  For additional rhythm and rate control patient is starting an amiodarone drip as well, per his cardiologist.  On repeat exam the patient notes that he feels better than earlier today, is aware of all findings thus far, including initial troponin which is normal, reassuring. On admission, the patient's BNP value is pending, he remains awake, alert, with initial reassuring labs. However, given the patient's new chest pain and dyspnea with exertion, persistent episodes of A. fib, he will require admission for further evaluation and management.  Final Clinical Impressions(s) / ED Diagnoses  Unstable angina   CRITICAL CARE Performed by: Gerhard Munch Total critical care time: 35 minutes Critical care time was exclusive of separately billable procedures and treating other patients. Critical care was necessary to treat or prevent imminent or life-threatening deterioration. Critical care was time spent personally by me on the following activities: development of treatment plan with patient and/or surrogate as well as nursing, discussions with consultants, evaluation of patient's response to treatment, examination of patient, obtaining history from patient or surrogate, ordering and performing treatments and interventions, ordering and review of laboratory studies, ordering and review of radiographic studies, pulse oximetry and re-evaluation of patient's condition.    Gerhard Munch, MD 02/04/18 [id number]

## 2018-02-04 NOTE — H&P (Signed)
PCP: Patient, No Pcp Per Primary Cardiologist: Dr Wyline Mood Primary EP: Dr Johney Frame  [name] [name] is a 67 y.o. male who presents today for urgent electrophysiology followup. He has symptoms of palpitations chest pain, and SOB intermittently for the past 2 weeks.  He thinks that he has been having some afib episodes for the past 2 months.  Episodes have increased in frequency and duration since that time.  Over the past 2-3 days, his chest pain has also increased in frequency and duration.  He feels that he has SOB and CP even when in sinus rhythm.  Today, he denies symptoms of lower extremity edema, dizziness, presyncope, or syncope.  The patient is otherwise without complaint today.   Past Medical History:  Diagnosis Date  . Anxiety   . Chronic chest pain   . Chronic sinus bradycardia   . Coronary artery disease    2v CABG, 2004 [facility])  . DJD (degenerative joint disease)   . Edema   . HLD (hyperlipidemia)   . Hypertension   . MI, old 2004  . OA (osteoarthritis)   . Obesity   . PAF (paroxysmal atrial fibrillation) (HCC)    a. started on sotalol 08/2016.  Marland Kitchen Palpitations    Reported history of PVCs  . QT prolongation   . Sleep apnea    on CPAP  . Spinal stenosis   . Typical atrial flutter [facility])    Past Surgical History:  Procedure Laterality Date  . BACK SURGERY  2011  . CARPAL TUNNEL RELEASE     bilateral  . CORONARY ARTERY BYPASS GRAFT     2004  . ELECTROPHYSIOLOGIC STUDY [name] 09/15/2016   Procedure: Atrial Fibrillation Ablation;  Surgeon: Hillis Range, MD;  Location: [facility] INVASIVE CV LAB;  Service: Cardiovascular;  Laterality: [name];  . fistula surgery    . hemorrhoidectomy    . LUMBAR LAMINECTOMY    . right foot fracture    . TEE WITHOUT CARDIOVERSION [name] 09/15/2016   Procedure: TRANSESOPHAGEAL ECHOCARDIOGRAM (TEE);  Surgeon: Lewayne Bunting, MD;  Location: [facility] ENDOSCOPY;  Service: Cardiovascular;  Laterality: [name];  . TOTAL KNEE ARTHROPLASTY Left 07/07/2017   Procedure:  LEFT TOTAL KNEE ARTHROPLASTY;  Surgeon: Ranee Gosselin, MD;  Location: [facility] ORS;  Service: Orthopedics;  Laterality: Left;  . TRANSTHORACIC ECHOCARDIOGRAM  08/25/2010   EF 55-60%    ROS- all systems are reviewed and negatives except as per HPI above  Current Facility-Administered Medications  Medication Dose Route Frequency Provider Last Rate Last Dose  . heparin ADULT infusion 100 units/mL (25000 units/22mL sodium chloride 0.45%)  1,300 Units/hr Intravenous Continuous Gerhard Munch, MD       Current Outpatient Medications  Medication Sig Dispense Refill  . acetaminophen (TYLENOL) 650 MG CR tablet Take 650 mg by mouth every 8 (eight) hours as needed for pain.    Marland Kitchen apixaban (ELIQUIS) 5 MG TABS tablet Take 1 tablet (5 mg total) by mouth 2 (two) times daily. 180 tablet 1  . atorvastatin (LIPITOR) 20 MG tablet Take 1 tablet (20 mg total) by mouth daily. (Patient taking differently: Take 20 mg by mouth daily at 6 PM. ) 90 tablet 1  . chlorthalidone (HYGROTON) 25 MG tablet Take 1.5 tablets (37.5 mg total) by mouth daily. 135 tablet 1  . Coenzyme Q10 (CO Q 10) 100 MG CAPS Take 400 mg by mouth daily.     Marland Kitchen diltiazem (CARDIZEM CD) 360 MG 24 hr capsule Take 1 capsule (360 mg total) by mouth daily.  90 capsule 1  . fish oil-omega-3 fatty acids 1000 MG capsule Take 1 g by mouth 2 (two) times daily.     . irbesartan (AVAPRO) 300 MG tablet Take 1 tablet (300 mg total) by mouth at bedtime. 90 tablet 1  . metoprolol tartrate (LOPRESSOR) 25 MG tablet Take 12.5 mg by mouth 2 (two) times daily as needed (a-fib).     . Multiple Vitamin (MULTIVITAMIN) tablet Take 1 tablet by mouth daily.    Marland Kitchen omeprazole (PRILOSEC) 20 MG capsule Take 1 capsule (20 mg total) by mouth every other day. 90 capsule 1  . PRESCRIPTION MEDICATION Pt uses CPAP machine at bedtime    . sertraline (ZOLOFT) 25 MG tablet Take 1 tablet (25 mg total) by mouth daily. 90 tablet 3    Physical Exam: Vitals:   02/04/18 1600 02/04/18 1630  02/04/18 1745 02/04/18 1845  BP: 129/73 113/64 108/66 113/64  Pulse: 70 70 76 60  Resp: (!) 21 20 15  (!) 24  Temp:      TempSrc:      SpO2: 98% 96% 97% 96%  Weight:      Height:        GEN- The patient is well appearing, alert and oriented x 3 today.   Head- normocephalic, atraumatic Eyes-  Sclera clear, conjunctiva pink Ears- hearing intact Oropharynx- clear Lungs- Clear to ausculation bilaterally, normal work of breathing Heart- irregular rate and rhythm, no murmurs, rubs or gallops, PMI not laterally displaced GI- soft, NT, ND, + BS Extremities- no clubbing, cyanosis, or edema Psych- anxious  EKG tracing ordered today is personally reviewed and shows afib, V rate 87 bpm, RBBB, no acute ST/T changes  Assessment and Plan:  1. Unstable angina He has had progressive SOB and CP worrisome for unstable angina. I would therefore admit emergently to [facility] for further management.  He is clear in his decision to decline EMS transportation.  His wife and daughter are with him today and feel that they can safely drive him to the ER.    I will alert the cardiology team at [facility] that he will be coming. Would plan to stop eliquis and start on IV heparin. Cycle cardiac markers and proceed with cath on Monday.  2. Persistent afib/ atrial flutter He has done well since his prior ablation.  Unfortunately, his afib has returned and he is quite symptomatic. On eliquis for chads2vasc score of 2. He now has recurrent afib post ablation.   Hold eliquis for cath (as above)  Start IV heparin Start on IV amiodarone Once he is more clinically stable, I will plan discussions of repeat ablation as an outpatient. Hopefully we can treat with amiodarone for the interim.  3. HTN Elevated today Admit to [facility] as above  4. Obesity Body mass index is 40.19 kg/m. Wt Readings from Last 3 Encounters:  02/04/18 249 lb (112.9 kg)  02/04/18 249 lb 6.4 oz (113.1 kg)  10/20/17 257 lb (116.6  kg)  lifestyle modification encouraged  5. OSA Compliant with CPAP   Very complicated patient at risk for decompensation.  A high level of decision making was required for this encounter.  Hillis Range MD, [facility] 02/04/2018 7:17 PM

## 2018-02-04 NOTE — Progress Notes (Signed)
PCP: Patient, No Pcp Per Primary Cardiologist: Dr Wyline Mood Primary EP: Dr Johney Frame  [name] [name] is a 67 y.o. male who presents today for urgent electrophysiology followup. He has symptoms of palpitations chest pain, and SOB intermittently for the past 2 weeks.  He thinks that he has been having some afib episodes for the past 2 months.  Episodes have increased in frequency and duration since that time.  Over the past 2-3 days, his chest pain has also increased in frequency and duration.  He feels that he has SOB and CP even when in sinus rhythm.  Today, he denies symptoms of lower extremity edema, dizziness, presyncope, or syncope.  The patient is otherwise without complaint today.   Past Medical History:  Diagnosis Date  . Anxiety   . Chronic chest pain   . Chronic sinus bradycardia   . Coronary artery disease    2v CABG, 2004 [facility])  . DJD (degenerative joint disease)   . Edema   . HLD (hyperlipidemia)   . Hypertension   . MI, old 2004  . OA (osteoarthritis)   . Obesity   . PAF (paroxysmal atrial fibrillation) (HCC)    a. started on sotalol 08/2016.  Marland Kitchen Palpitations    Reported history of PVCs  . QT prolongation   . Sleep apnea    on CPAP  . Spinal stenosis   . Typical atrial flutter [facility])    Past Surgical History:  Procedure Laterality Date  . BACK SURGERY  2011  . CARPAL TUNNEL RELEASE     bilateral  . CORONARY ARTERY BYPASS GRAFT     2004  . ELECTROPHYSIOLOGIC STUDY [name] 09/15/2016   Procedure: Atrial Fibrillation Ablation;  Surgeon: Hillis Range, MD;  Location: [facility] INVASIVE CV LAB;  Service: Cardiovascular;  Laterality: [name];  . fistula surgery    . hemorrhoidectomy    . LUMBAR LAMINECTOMY    . right foot fracture    . TEE WITHOUT CARDIOVERSION [name] 09/15/2016   Procedure: TRANSESOPHAGEAL ECHOCARDIOGRAM (TEE);  Surgeon: Lewayne Bunting, MD;  Location: [facility] ENDOSCOPY;  Service: Cardiovascular;  Laterality: [name];  . TOTAL KNEE ARTHROPLASTY Left 07/07/2017   Procedure:  LEFT TOTAL KNEE ARTHROPLASTY;  Surgeon: Ranee Gosselin, MD;  Location: [facility] ORS;  Service: Orthopedics;  Laterality: Left;  . TRANSTHORACIC ECHOCARDIOGRAM  08/25/2010   EF 55-60%    ROS- all systems are reviewed and negatives except as per HPI above  Current Outpatient Medications  Medication Sig Dispense Refill  . acetaminophen (TYLENOL) 650 MG CR tablet Take 650 mg by mouth every 8 (eight) hours as needed for pain.    Marland Kitchen apixaban (ELIQUIS) 5 MG TABS tablet Take 1 tablet (5 mg total) by mouth 2 (two) times daily. 180 tablet 1  . atorvastatin (LIPITOR) 20 MG tablet Take 1 tablet (20 mg total) by mouth daily. 90 tablet 1  . chlorthalidone (HYGROTON) 25 MG tablet Take 1.5 tablets (37.5 mg total) by mouth daily. 135 tablet 1  . Coenzyme Q10 (CO Q 10) 100 MG CAPS Take 400 mg by mouth daily.     Marland Kitchen diltiazem (CARDIZEM CD) 360 MG 24 hr capsule Take 1 capsule (360 mg total) by mouth daily. 90 capsule 1  . fish oil-omega-3 fatty acids 1000 MG capsule Take 1 g by mouth 2 (two) times daily.     . irbesartan (AVAPRO) 300 MG tablet Take 1 tablet (300 mg total) by mouth at bedtime. 90 tablet 1  . metoprolol tartrate (LOPRESSOR) 25 MG tablet  Take 12.5 mg by mouth 2 (two) times daily as needed (a-fib).     . Multiple Vitamin (MULTIVITAMIN) tablet Take 1 tablet by mouth daily.    Marland Kitchen omeprazole (PRILOSEC) 20 MG capsule Take 1 capsule (20 mg total) by mouth every other day. 90 capsule 1  . PRESCRIPTION MEDICATION Pt uses CPAP machine at bedtime    . sertraline (ZOLOFT) 25 MG tablet Take 1 tablet (25 mg total) by mouth daily. 90 tablet 3   No current facility-administered medications for this visit.     Physical Exam: Vitals:   02/04/18 1304  BP: (!) 148/91  Pulse: 82  SpO2: 97%  Weight: 249 lb 6.4 oz (113.1 kg)  Height: 5\' 6"  (1.676 m)    GEN- The patient is well appearing, alert and oriented x 3 today.   Head- normocephalic, atraumatic Eyes-  Sclera clear, conjunctiva pink Ears- hearing  intact Oropharynx- clear Lungs- Clear to ausculation bilaterally, normal work of breathing Heart- irregular rate and rhythm, no murmurs, rubs or gallops, PMI not laterally displaced GI- soft, NT, ND, + BS Extremities- no clubbing, cyanosis, or edema Psych- anxious  EKG tracing ordered today is personally reviewed and shows afib, V rate 87 bpm, RBBB, no acute ST/T changes  Assessment and Plan:  1. Unstable angina He has had progressive SOB and CP worrisome for unstable angina. I would therefore admit emergently to [facility] for further management.  He is clear in his decision to decline EMS transportation.  His wife and daughter are with him today and feel that they can safely drive him to the ER.    I will alert the cardiology team at [facility] that he will be coming. Would plan to stop eliquis and start on IV heparin. Cycle cardiac markers and proceed with cath on Monday.  2. Persistent afib/ atrial flutter He has done well since his prior ablation.  Unfortunately, his afib has returned and he is quite symptomatic. On eliquis for chads2vasc score of 2. He now has recurrent afib post ablation.   Hold eliquis for cath (as above)  Start IV heparin Start on IV amiodarone Once he is more clinically stable, I will plan discussions of repeat ablation as an outpatient. Hopefully we can treat with amiodarone for the interim.  3. HTN Elevated today Admit to [facility] as above  4. Obesity Body mass index is 40.25 kg/m. Wt Readings from Last 3 Encounters:  02/04/18 249 lb 6.4 oz (113.1 kg)  10/20/17 257 lb (116.6 kg)  08/16/17 253 lb 3.2 oz (114.9 kg)  lifestyle modification encouraged  5. OSA Compliant with CPAP   Very complicated patient at risk for decompensation.  A high level of decision making was required for this encounter.  Hillis Range MD, [facility] 02/04/2018 1:37 PM

## 2018-02-04 NOTE — ED Notes (Signed)
ED Provider at bedside. 

## 2018-02-05 ENCOUNTER — Other Ambulatory Visit: Payer: Self-pay

## 2018-02-05 ENCOUNTER — Encounter (HOSPITAL_COMMUNITY): Payer: Self-pay | Admitting: Internal Medicine

## 2018-02-05 DIAGNOSIS — I200 Unstable angina: Secondary | ICD-10-CM

## 2018-02-05 DIAGNOSIS — I481 Persistent atrial fibrillation: Secondary | ICD-10-CM

## 2018-02-05 DIAGNOSIS — I119 Hypertensive heart disease without heart failure: Secondary | ICD-10-CM

## 2018-02-05 LAB — BASIC METABOLIC PANEL
ANION GAP: 12 (ref 5–15)
BUN: 17 mg/dL (ref 6–20)
CHLORIDE: 101 mmol/L (ref 101–111)
CO2: 24 mmol/L (ref 22–32)
Calcium: 9.5 mg/dL (ref 8.9–10.3)
Creatinine, Ser: 1.16 mg/dL (ref 0.61–1.24)
GFR calc Af Amer: >60 mL/min (ref >60)
Glucose, Bld: 137 mg/dL — ABNORMAL HIGH (ref 65–99)
POTASSIUM: 3.6 mmol/L (ref 3.5–5.1)
SODIUM: 137 mmol/L (ref 135–145)

## 2018-02-05 LAB — CBC
HEMATOCRIT: 44.8 % (ref 39.0–52.0)
HEMOGLOBIN: 15.4 g/dL (ref 13.0–17.0)
MCH: 29.3 pg (ref 26.0–34.0)
MCHC: 34.4 g/dL (ref 30.0–36.0)
MCV: 85.2 fL (ref 78.0–100.0)
Platelets: 307 10*3/uL (ref 150–400)
RBC: 5.26 MIL/uL (ref 4.22–5.81)
RDW: 13.9 % (ref 11.5–15.5)
WBC: 11.7 10*3/uL — AB (ref 4.0–10.5)

## 2018-02-05 LAB — TROPONIN I: Troponin I: <0.03 ng/mL (ref <0.03)

## 2018-02-05 LAB — LIPID PANEL
CHOL/HDL RATIO: 5.4 ratio
Cholesterol: 157 mg/dL (ref 0–200)
HDL: 29 mg/dL — AB (ref >40)
LDL CALC: 102 mg/dL — AB (ref 0–99)
TRIGLYCERIDES: 131 mg/dL (ref <150)
VLDL: 26 mg/dL (ref 0–40)

## 2018-02-05 LAB — APTT
APTT: 87 s — AB (ref 24–36)
aPTT: 63 seconds — ABNORMAL HIGH (ref 24–36)

## 2018-02-05 LAB — HEPARIN LEVEL (UNFRACTIONATED): Heparin Unfractionated: 2.04 IU/mL — ABNORMAL HIGH (ref 0.30–0.70)

## 2018-02-05 LAB — HIV ANTIBODY (ROUTINE TESTING W REFLEX): HIV Screen 4th Generation wRfx: NONREACTIVE

## 2018-02-05 MED ORDER — AMIODARONE HCL 200 MG PO TABS
400.0000 mg | ORAL_TABLET | Freq: Two times a day (BID) | ORAL | Status: DC
  Administered 2018-02-06 – 2018-02-07 (×3): 400 mg via ORAL
  Filled 2018-02-05 (×3): qty 2

## 2018-02-05 NOTE — Progress Notes (Signed)
ANTICOAGULATION CONSULT NOTE - Follow Up Consult  Pharmacy Consult for heparin Indication: USAP and Afib  Labs: Recent Labs    02/04/18 1512 02/04/18 1805 02/04/18 2137 02/05/18 0227  HGB 16.3  --   --   --   HCT 47.0  --   --   --   PLT 370  --   --   --   APTT  --  35  --  63*  HEPARINUNFRC  --  >2.20*  --   --   CREATININE 1.26*  --   --   --   TROPONINI  --   --  <0.03  --     Assessment: 67yo male subtherapeutic on heparin with initial dosing while Eliquis on hold for CP evaluation.  Goal of Therapy:  aPTT 66-102 seconds   Plan:  Will increase heparin gtt by 1 units/kg/hr to 1400 units/hr and check level in 6 hours.    Vernard Gambles, PharmD, BCPS  02/05/2018,3:21 AM

## 2018-02-05 NOTE — Progress Notes (Signed)
ANTICOAGULATION CONSULT NOTE - Follow-up  Pharmacy Consult for Heparin Indication: atrial fibrillation  Allergies  Allergen Reactions  . Nsaids Other (See Comments)    On blood thinner  . Vancomycin Other (See Comments)    Was informed after heart surgery ? Breathing problem/patient unsure   Patient Measurements: Height: 5\' 6"  (167.6 cm) Weight: 248 lb 6.4 oz (112.7 kg) IBW/kg (Calculated) : 63.8 Heparin Dosing Weight: 89.7 kg  Vital Signs: Temp: 97.7 F (36.5 C) (05/04 0656) Temp Source: Oral (05/04 0656) BP: 139/70 (05/04 0656) Pulse Rate: 67 (05/04 0656)  Labs: Recent Labs    02/04/18 1512 02/04/18 1805 02/04/18 2137 02/05/18 0227 02/05/18 0948  HGB 16.3  --   --   --  15.4  HCT 47.0  --   --   --  44.8  PLT 370  --   --   --  307  APTT  --  35  --  63* 87*  HEPARINUNFRC  --  >2.20*  --  2.04*  --   CREATININE 1.26*  --   --   --  1.16  TROPONINI  --   --  <0.03 <0.03 <0.03   Estimated Creatinine Clearance: 75.9 mL/min (by C-G formula based on SCr of 1.16 mg/dL).  Assessment: 76 yoM presenting with unstable angina, on Eliquis PTA for h/o afib. Plan for cardiac cath on Monday, 5/6. Pharmacy consulted to dose IV heparin in the interim. Last dose of Eliquis taken 5/3 @ 0730. Will dose IV heparin via aPTTs given impact of Eliquis on anti-Xa levels. CBC WNL, no bleeding noted.  aPTT this AM therapeutic at 87.    Goal of Therapy:  Heparin level 0.3-0.7 units/ml Monitor platelets by anticoagulation protocol: Yes  aPTT 66-102 seconds  Plan:  Continue heparin gtt at 1400 units/hr  Daily heparin level/aPTT and CBC Monitor for s/sx of bleeding  Adline Potter, PharmD Pharmacy Resident Pager: [phone]

## 2018-02-05 NOTE — Progress Notes (Signed)
Progress Note   Subjective   Doing well today, the patient denies CP or SOB.  No new concerns  Inpatient Medications    Scheduled Meds: . [START ON 02/06/2018] amiodarone  400 mg Oral BID  . atorvastatin  20 mg Oral q1800  . chlorthalidone  37.5 mg Oral Daily  . diltiazem  360 mg Oral Daily  . irbesartan  300 mg Oral QHS  . multivitamin with minerals  1 tablet Oral Daily  . omega-3 acid ethyl esters  1 g Oral BID  . pantoprazole  40 mg Oral Daily  . sertraline  25 mg Oral Daily   Continuous Infusions: . amiodarone 30 mg/hr (02/04/18 2349)  . heparin 1,400 Units/hr (02/05/18 0338)   PRN Meds: acetaminophen, metoprolol tartrate, nitroGLYCERIN, ondansetron (ZOFRAN) IV   Vital Signs    Vitals:   02/04/18 1915 02/04/18 2047 02/04/18 2055 02/05/18 0656  BP: 120/70  (!) 144/76 139/70  Pulse: 67  68 67  Resp: (!) 23  19 12   Temp:   97.8 F (36.6 C) 97.7 F (36.5 C)  TempSrc:   Oral Oral  SpO2: 96%   99%  Weight:  248 lb 8 oz (112.7 kg)  248 lb 6.4 oz (112.7 kg)  Height:  5\' 6"  (1.676 m)      Intake/Output Summary (Last 24 hours) at 02/05/2018 0941 Last data filed at 02/05/2018 7939 Gross per 24 hour  Intake 1175.48 ml  Output 450 ml  Net 725.48 ml   Filed Weights   02/04/18 1505 02/04/18 2047 02/05/18 0656  Weight: 249 lb (112.9 kg) 248 lb 8 oz (112.7 kg) 248 lb 6.4 oz (112.7 kg)    Telemetry    afib has converted to sinus rhythm - Personally Reviewed  Physical Exam   GEN- The patient is well appearing, alert and oriented x 3 today.   Head- normocephalic, atraumatic Eyes-  Sclera clear, conjunctiva pink Ears- hearing intact Oropharynx- clear Neck- supple, Lungs- Clear to ausculation bilaterally, normal work of breathing Heart- Regular rate and rhythm  GI- soft, NT, ND, + BS Extremities- no clubbing, cyanosis, or edema  MS- no significant deformity or atrophy Skin- no rash or lesion Psych- very anxious Neuro- strength and sensation are intact   Labs      Chemistry Recent Labs  Lab 02/04/18 1512  NA 141  K 4.7  CL 103  CO2 27  GLUCOSE 101*  BUN 18  CREATININE 1.26*  CALCIUM 10.1  GFRNONAA 59*  GFRAA >60  ANIONGAP 11     Hematology Recent Labs  Lab 02/04/18 1512  WBC 12.9*  RBC 5.49  HGB 16.3  HCT 47.0  MCV 85.6  MCH 29.7  MCHC 34.7  RDW 13.9  PLT 370    Cardiac Enzymes Recent Labs  Lab 02/04/18 2137 02/05/18 0227  TROPONINI <0.03 <0.03    Recent Labs  Lab 02/04/18 1519  TROPIPOC 0.02        Assessment & Plan    1.  Persistent afib He has converted to sinus with IV amiodarone Continue IV amiodarone until tomorrow am and then convert to oral On heparin drip for now,  Resume eliquis post cath  2. Unstable angina Pt with significant chest pain over the past 2 weeks.  Tn not suggestive of MI. Will plan cath on Monday.  S/p prior CABG in 2004 Continue on IV heparin over the weekend.  3. Hypertensive cardiovascular disease Stable No change required today  4. OSA Compliant with CPAP  5. Obesity Body mass index is 40.09 kg/m.   Hillis Range MD, [facility] 02/05/2018 9:41 AM

## 2018-02-06 LAB — HEPARIN LEVEL (UNFRACTIONATED): Heparin Unfractionated: 0.84 IU/mL — ABNORMAL HIGH (ref 0.30–0.70)

## 2018-02-06 LAB — APTT: aPTT: 77 seconds — ABNORMAL HIGH (ref 24–36)

## 2018-02-06 MED ORDER — ALPRAZOLAM 0.25 MG PO TABS
0.2500 mg | ORAL_TABLET | Freq: Every evening | ORAL | Status: DC | PRN
  Administered 2018-02-06: 0.25 mg via ORAL
  Filled 2018-02-06: qty 1

## 2018-02-06 NOTE — Progress Notes (Signed)
ANTICOAGULATION CONSULT NOTE - Follow-up  Pharmacy Consult for Heparin Indication: atrial fibrillation  Allergies  Allergen Reactions  . Nsaids Other (See Comments)    On blood thinner  . Vancomycin Other (See Comments)    Was informed after heart surgery ? Breathing problem/patient unsure   Patient Measurements: Height: 5\' 6"  (167.6 cm) Weight: 249 lb 8 oz (113.2 kg) IBW/kg (Calculated) : 63.8 Heparin Dosing Weight: 89.7 kg  Vital Signs: Temp: 97.6 F (36.4 C) (05/05 0544) Temp Source: Oral (05/05 0544) BP: 117/87 (05/05 0544) Pulse Rate: 63 (05/05 0544)  Labs: Recent Labs    02/04/18 1512  02/04/18 1805 02/04/18 2137 02/05/18 0227 02/05/18 0948 02/06/18 0330  HGB 16.3  --   --   --   --  15.4  --   HCT 47.0  --   --   --   --  44.8  --   PLT 370  --   --   --   --  307  --   APTT  --    < > 35  --  63* 87* 77*  HEPARINUNFRC  --   --  >2.20*  --  2.04*  --  0.84*  CREATININE 1.26*  --   --   --   --  1.16  --   TROPONINI  --   --   --  <0.03 <0.03 <0.03  --    < > = values in this interval not displayed.   Estimated Creatinine Clearance: 76.1 mL/min (by C-G formula based on SCr of 1.16 mg/dL).  Assessment: 71 yoM presenting with unstable angina, on Eliquis PTA for h/o afib. Plan for cardiac cath on Monday, 5/6. Pharmacy consulted to dose IV heparin in the interim. Last dose of Eliquis taken 5/3 @ 0730. Will dose IV heparin via aPTTs given impact of Eliquis on anti-Xa levels. CBC WNL, no bleeding noted.  aPTT this AM therapeutic at 77, HL 0.84   Goal of Therapy:  Heparin level 0.3-0.7 units/ml Monitor platelets by anticoagulation protocol: Yes  aPTT 66-102 seconds  Plan:  Continue heparin gtt at 1400 units/hr  Daily heparin level/aPTT and CBC Monitor for s/sx of bleeding  Adline Potter, PharmD Pharmacy Resident Pager: [phone]

## 2018-02-06 NOTE — H&P (View-Only) (Signed)
Progress Note  Patient Name: [name] [name] Date of Encounter: 02/06/2018  Primary Cardiologist: Wyline Mood Primary Electrophysiologist: Cleopatra Sardo  Subjective   No chest pain or SOB. Remains in sinus rhythm on amiodarone infusion and heparin gtt. Labs pending this morning. For LHC 5/6 (orders placed). Vitals stable. Anxious for LHC 5/6, asks for antianxiety this evening to help him sleep.    Inpatient Medications    Scheduled Meds: . amiodarone  400 mg Oral BID  . atorvastatin  20 mg Oral q1800  . chlorthalidone  37.5 mg Oral Daily  . diltiazem  360 mg Oral Daily  . irbesartan  300 mg Oral QHS  . multivitamin with minerals  1 tablet Oral Daily  . omega-3 acid ethyl esters  1 g Oral BID  . pantoprazole  40 mg Oral Daily  . sertraline  25 mg Oral Daily   Continuous Infusions: . amiodarone 30 mg/hr (02/05/18 2149)  . heparin 1,400 Units/hr (02/05/18 0338)   PRN Meds: acetaminophen, metoprolol tartrate, nitroGLYCERIN, ondansetron (ZOFRAN) IV   Vital Signs    Vitals:   02/05/18 0656 02/05/18 1723 02/05/18 2000 02/06/18 0544  BP: 139/70 138/76 134/61 117/87  Pulse: 67 68 66 63  Resp: 12 (!) 28 18 17   Temp: 97.7 F (36.5 C) 98 F (36.7 C) 98.1 F (36.7 C) 97.6 F (36.4 C)  TempSrc: Oral Oral Oral Oral  SpO2: 99% 96% 97% 99%  Weight: 248 lb 6.4 oz (112.7 kg)   249 lb 8 oz (113.2 kg)  Height:        Intake/Output Summary (Last 24 hours) at 02/06/2018 0741 Last data filed at 02/06/2018 0700 Gross per 24 hour  Intake 1696.8 ml  Output 700 ml  Net 996.8 ml   Filed Weights   02/04/18 2047 02/05/18 0656 02/06/18 0544  Weight: 248 lb 8 oz (112.7 kg) 248 lb 6.4 oz (112.7 kg) 249 lb 8 oz (113.2 kg)    Telemetry    NSR to sinus bradycardia, rates mid 50s to mid 60s bpm - Personally Reviewed  ECG    n/a - Personally Reviewed  Physical Exam   GEN: No acute distress.   Neck: No JVD. Cardiac: RRR, no murmurs, rubs, or gallops.  Respiratory: Clear to auscultation  bilaterally.  GI: Soft, nontender, non-distended.   MS: No edema; No deformity. Neuro:  Alert and oriented x 3; Nonfocal.  Psych: Normal affect.  Labs    Chemistry Recent Labs  Lab 02/04/18 1512 02/05/18 0948  NA 141 137  K 4.7 3.6  CL 103 101  CO2 27 24  GLUCOSE 101* 137*  BUN 18 17  CREATININE 1.26* 1.16  CALCIUM 10.1 9.5  GFRNONAA 59* >60  GFRAA >60 >60  ANIONGAP 11 12     Hematology Recent Labs  Lab 02/04/18 1512 02/05/18 0948  WBC 12.9* 11.7*  RBC 5.49 5.26  HGB 16.3 15.4  HCT 47.0 44.8  MCV 85.6 85.2  MCH 29.7 29.3  MCHC 34.7 34.4  RDW 13.9 13.9  PLT 370 307    Cardiac Enzymes Recent Labs  Lab 02/04/18 2137 02/05/18 0227 02/05/18 0948  TROPONINI <0.03 <0.03 <0.03    Recent Labs  Lab 02/04/18 1519  TROPIPOC 0.02     BNP Recent Labs  Lab 02/04/18 1512  BNP 149.8*     DDimer No results for input(s): DDIMER in the last 168 hours.   Radiology    Dg Chest 2 View  Result Date: 02/04/2018 IMPRESSION: No active cardiopulmonary  disease. Electronically Signed   By: Obie Dredge M.D.   On: 02/04/2018 15:43    Cardiac Studies   LHC pending  Patient Profile     67 y.o. male with history of CAD s/p 2-vessel CABG in 2004 at [facility], persistent Afib/flutter s/p prior ablation, asymptomatic bradycardia, HTN, HLD, chronic chest pain, obesity, OSA on CPAP, spinal stenosis, OA, and anxiety who was admitted to [facility] on 5/3 with unstable angina and persistent Afib/flutter.  Assessment & Plan    1. Unstable angina/CAD s/p CABG in 2004: -Chest pain free -Ruled out -Heparin gtt -For LHC 5/6 -Aggressive risk factor modification with secondary prevention -Risks and benefits of cardiac catheterization have been discussed with the patient including risks of bleeding, bruising, infection, kidney damage, stroke, heart attack, and death. The patient understands these risks and is willing to proceed with the procedure. All questions have been answered  and concerns listened to  2. Persistent Afib/flutter s/p remote ablation: -Remains in sinus rhythm with heart rates from the mid 50s to 60s bpm -Continue heparin gtt through cardiac cath on 5/6, then transition back to PTA Eliquis once stable from a cardiac cath perspective -Transition from IV amiodarone to PO 400 mg bid this morning  -Continue rate control with Cardizem and prn metoprolol  -CHADS2VASc at least (HTN, vascular disease)  3. Hypertensive heart disease: -Blood pressure well controlled -Continue chlorthalidone, Cardizem, and irbesartan   4. 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 Obesity/OSA: -Compliant with CPAP -Weight loss advised   6. Anxiety: -Will add on prn Xanax for this evening -Continue PTA Zoloft    For questions or updates, please contact [facility] Please consult [email or web address] for contact info under Cardiology/STEMI.    Signed, Eula Listen, PA-C [facility] Pager: [phone] 02/06/2018, 7:41 AM   I have seen, examined the patient, and reviewed the above assessment and plan.  Changes to above are made where necessary.  On exam, anxious, RRR.  Remains in sinus.  Now on oral amiodarone. Would continue amiodarone 400mg  BID x 1 week then 200mg  BID x 3 weeks then 200mg  daily. Planned for cath in am. General cardiology to manage this admission. Would have him follow-up in AF clinic 3-5 days post discharge.   Co Sign: Hillis Range, MD 02/06/2018 10:06 AM

## 2018-02-06 NOTE — Progress Notes (Addendum)
  Progress Note  Patient Name: [name] [name] Date of Encounter: 02/06/2018  Primary Cardiologist: Branch Primary Electrophysiologist: Johnica Armwood  Subjective   No chest pain or SOB. Remains in sinus rhythm on amiodarone infusion and heparin gtt. Labs pending this morning. For LHC 5/6 (orders placed). Vitals stable. Anxious for LHC 5/6, asks for antianxiety this evening to help him sleep.    Inpatient Medications    Scheduled Meds: . amiodarone  400 mg Oral BID  . atorvastatin  20 mg Oral q1800  . chlorthalidone  37.5 mg Oral Daily  . diltiazem  360 mg Oral Daily  . irbesartan  300 mg Oral QHS  . multivitamin with minerals  1 tablet Oral Daily  . omega-3 acid ethyl esters  1 g Oral BID  . pantoprazole  40 mg Oral Daily  . sertraline  25 mg Oral Daily   Continuous Infusions: . amiodarone 30 mg/hr (02/05/18 2149)  . heparin 1,400 Units/hr (02/05/18 0338)   PRN Meds: acetaminophen, metoprolol tartrate, nitroGLYCERIN, ondansetron (ZOFRAN) IV   Vital Signs    Vitals:   02/05/18 0656 02/05/18 1723 02/05/18 2000 02/06/18 0544  BP: 139/70 138/76 134/61 117/87  Pulse: 67 68 66 63  Resp: 12 (!) 28 18 17  Temp: 97.7 F (36.5 C) 98 F (36.7 C) 98.1 F (36.7 C) 97.6 F (36.4 C)  TempSrc: Oral Oral Oral Oral  SpO2: 99% 96% 97% 99%  Weight: 248 lb 6.4 oz (112.7 kg)   249 lb 8 oz (113.2 kg)  Height:        Intake/Output Summary (Last 24 hours) at 02/06/2018 0741 Last data filed at 02/06/2018 0700 Gross per 24 hour  Intake 1696.8 ml  Output 700 ml  Net 996.8 ml   Filed Weights   02/04/18 2047 02/05/18 0656 02/06/18 0544  Weight: 248 lb 8 oz (112.7 kg) 248 lb 6.4 oz (112.7 kg) 249 lb 8 oz (113.2 kg)    Telemetry    NSR to sinus bradycardia, rates mid 50s to mid 60s bpm - Personally Reviewed  ECG    n/a - Personally Reviewed  Physical Exam   GEN: No acute distress.   Neck: No JVD. Cardiac: RRR, no murmurs, rubs, or gallops.  Respiratory: Clear to auscultation  bilaterally.  GI: Soft, nontender, non-distended.   MS: No edema; No deformity. Neuro:  Alert and oriented x 3; Nonfocal.  Psych: Normal affect.  Labs    Chemistry Recent Labs  Lab 02/04/18 1512 02/05/18 0948  NA 141 137  K 4.7 3.6  CL 103 101  CO2 27 24  GLUCOSE 101* 137*  BUN 18 17  CREATININE 1.26* 1.16  CALCIUM 10.1 9.5  GFRNONAA 59* >60  GFRAA >60 >60  ANIONGAP 11 12     Hematology Recent Labs  Lab 02/04/18 1512 02/05/18 0948  WBC 12.9* 11.7*  RBC 5.49 5.26  HGB 16.3 15.4  HCT 47.0 44.8  MCV 85.6 85.2  MCH 29.7 29.3  MCHC 34.7 34.4  RDW 13.9 13.9  PLT 370 307    Cardiac Enzymes Recent Labs  Lab 02/04/18 2137 02/05/18 0227 02/05/18 0948  TROPONINI <0.03 <0.03 <0.03    Recent Labs  Lab 02/04/18 1519  TROPIPOC 0.02     BNP Recent Labs  Lab 02/04/18 1512  BNP 149.8*     DDimer No results for input(s): DDIMER in the last 168 hours.   Radiology    Dg Chest 2 View  Result Date: 02/04/2018 IMPRESSION: No active cardiopulmonary   disease. Electronically Signed   By: William T Derry M.D.   On: 02/04/2018 15:43    Cardiac Studies   LHC pending  Patient Profile     67 y.o. male with history of CAD s/p 2-vessel CABG in 2004 at [facility], persistent Afib/flutter s/p prior ablation, asymptomatic bradycardia, HTN, HLD, chronic chest pain, obesity, OSA on CPAP, spinal stenosis, OA, and anxiety who was admitted to [facility] on 5/3 with unstable angina and persistent Afib/flutter.  Assessment & Plan    1. Unstable angina/CAD s/p CABG in 2004: -Chest pain free -Ruled out -Heparin gtt -For LHC 5/6 -Aggressive risk factor modification with secondary prevention -Risks and benefits of cardiac catheterization have been discussed with the patient including risks of bleeding, bruising, infection, kidney damage, stroke, heart attack, and death. The patient understands these risks and is willing to proceed with the procedure. All questions have been answered  and concerns listened to  2. Persistent Afib/flutter s/p remote ablation: -Remains in sinus rhythm with heart rates from the mid 50s to 60s bpm -Continue heparin gtt through cardiac cath on 5/6, then transition back to PTA Eliquis once stable from a cardiac cath perspective -Transition from IV amiodarone to PO 400 mg bid this morning  -Continue rate control with Cardizem and prn metoprolol  -CHADS2VASc at least (HTN, vascular disease)  3. Hypertensive heart disease: -Blood pressure well controlled -Continue chlorthalidone, Cardizem, and irbesartan   4. 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 Obesity/OSA: -Compliant with CPAP -Weight loss advised   6. Anxiety: -Will add on prn Xanax for this evening -Continue PTA Zoloft    For questions or updates, please contact [facility] Please consult [email or web address] for contact info under Cardiology/STEMI.    Signed, Ryan Dunn, PA-C [facility] Pager: [phone] 02/06/2018, 7:41 AM   I have seen, examined the patient, and reviewed the above assessment and plan.  Changes to above are made where necessary.  On exam, anxious, RRR.  Remains in sinus.  Now on oral amiodarone. Would continue amiodarone 400mg BID x 1 week then 200mg BID x 3 weeks then 200mg daily. Planned for cath in am. General cardiology to manage this admission. Would have him follow-up in AF clinic 3-5 days post discharge.   Co Sign: Lyniah Fujita, MD 02/06/2018 10:06 AM   

## 2018-02-06 NOTE — Progress Notes (Signed)
Patient has a home CPAP unit at bedside. No assistance needed from RT.

## 2018-02-07 ENCOUNTER — Encounter (HOSPITAL_COMMUNITY): Payer: Self-pay | Admitting: Cardiology

## 2018-02-07 ENCOUNTER — Ambulatory Visit (HOSPITAL_COMMUNITY): Admission: RE | Admit: 2018-02-07 | Payer: Medicare HMO | Source: Ambulatory Visit | Admitting: Cardiology

## 2018-02-07 ENCOUNTER — Inpatient Hospital Stay (HOSPITAL_COMMUNITY)
Admission: EM | Disposition: A | Discharge status: Home / Self Care | Payer: Self-pay | Source: Home / Self Care | Attending: Internal Medicine

## 2018-02-07 DIAGNOSIS — I10 Essential (primary) hypertension: Secondary | ICD-10-CM | POA: Diagnosis not present

## 2018-02-07 DIAGNOSIS — I200 Unstable angina: Secondary | ICD-10-CM | POA: Diagnosis not present

## 2018-02-07 DIAGNOSIS — I25110 Atherosclerotic heart disease of native coronary artery with unstable angina pectoris: Secondary | ICD-10-CM | POA: Diagnosis not present

## 2018-02-07 DIAGNOSIS — E785 Hyperlipidemia, unspecified: Secondary | ICD-10-CM | POA: Diagnosis not present

## 2018-02-07 DIAGNOSIS — I480 Paroxysmal atrial fibrillation: Secondary | ICD-10-CM | POA: Diagnosis not present

## 2018-02-07 HISTORY — PX: LEFT HEART CATH AND CORS/GRAFTS ANGIOGRAPHY: CATH118250

## 2018-02-07 LAB — HEPARIN LEVEL (UNFRACTIONATED): Heparin Unfractionated: 0.68 IU/mL (ref 0.30–0.70)

## 2018-02-07 LAB — POCT ACTIVATED CLOTTING TIME: ACTIVATED CLOTTING TIME: 114 s

## 2018-02-07 SURGERY — LEFT HEART CATH AND CORS/GRAFTS ANGIOGRAPHY
Anesthesia: LOCAL

## 2018-02-07 MED ORDER — SODIUM CHLORIDE 0.9% FLUSH: 3.0000 mL | INTRAVENOUS | Status: DC | PRN

## 2018-02-07 MED ORDER — FENTANYL CITRATE (PF) 100 MCG/2ML IJ SOLN
INTRAMUSCULAR | Status: AC
  Filled 2018-02-07: qty 2

## 2018-02-07 MED ORDER — SODIUM CHLORIDE 0.9% FLUSH: 3.0000 mL | Freq: Two times a day (BID) | INTRAVENOUS | Status: DC

## 2018-02-07 MED ORDER — HEPARIN (PORCINE) IN NACL 2-0.9 UNITS/ML
INTRAMUSCULAR | Status: AC | PRN
  Administered 2018-02-07 (×2): 500 mL via INTRA_ARTERIAL

## 2018-02-07 MED ORDER — HEPARIN (PORCINE) IN NACL 1000-0.9 UT/500ML-% IV SOLN
INTRAVENOUS | Status: AC
  Filled 2018-02-07: qty 1000

## 2018-02-07 MED ORDER — LIDOCAINE HCL (PF) 1 % IJ SOLN
INTRAMUSCULAR | Status: DC | PRN
  Administered 2018-02-07: 8 mL via INTRADERMAL

## 2018-02-07 MED ORDER — AMIODARONE HCL 200 MG PO TABS: ORAL_TABLET | ORAL | 0 refills | Status: DC

## 2018-02-07 MED ORDER — MIDAZOLAM HCL 2 MG/2ML IJ SOLN
INTRAMUSCULAR | Status: AC
  Filled 2018-02-07: qty 2

## 2018-02-07 MED ORDER — SODIUM CHLORIDE 0.9 % WEIGHT BASED INFUSION: 1.0000 mL/kg/h | INTRAVENOUS | Status: DC

## 2018-02-07 MED ORDER — MIDAZOLAM HCL 2 MG/2ML IJ SOLN
INTRAMUSCULAR | Status: DC | PRN
  Administered 2018-02-07: 1 mg via INTRAVENOUS
  Administered 2018-02-07: 2 mg via INTRAVENOUS

## 2018-02-07 MED ORDER — SODIUM CHLORIDE 0.9 % IV SOLN: 250.0000 mL | INTRAVENOUS | Status: DC | PRN

## 2018-02-07 MED ORDER — IOPAMIDOL (ISOVUE-370) INJECTION 76%
INTRAVENOUS | Status: AC
  Filled 2018-02-07: qty 125

## 2018-02-07 MED ORDER — ASPIRIN 81 MG PO CHEW
81.0000 mg | CHEWABLE_TABLET | ORAL | Status: AC
  Administered 2018-02-07: 81 mg via ORAL
  Filled 2018-02-07: qty 1

## 2018-02-07 MED ORDER — SODIUM CHLORIDE 0.9 % WEIGHT BASED INFUSION
1.0000 mL/kg/h | INTRAVENOUS | Status: DC
  Administered 2018-02-07: 1 mL/kg/h via INTRAVENOUS

## 2018-02-07 MED ORDER — SODIUM CHLORIDE 0.9 % WEIGHT BASED INFUSION: 3.0000 mL/kg/h | INTRAVENOUS | Status: DC

## 2018-02-07 MED ORDER — FENTANYL CITRATE (PF) 100 MCG/2ML IJ SOLN
INTRAMUSCULAR | Status: DC | PRN
  Administered 2018-02-07 (×2): 25 ug via INTRAVENOUS

## 2018-02-07 MED ORDER — APIXABAN 5 MG PO TABS: 5.0000 mg | ORAL_TABLET | Freq: Two times a day (BID) | ORAL | Status: DC

## 2018-02-07 MED ORDER — IOPAMIDOL (ISOVUE-370) INJECTION 76%
INTRAVENOUS | Status: DC | PRN
  Administered 2018-02-07: 105 mL via INTRA_ARTERIAL

## 2018-02-07 MED ORDER — LIDOCAINE HCL (PF) 1 % IJ SOLN
INTRAMUSCULAR | Status: AC
  Filled 2018-02-07: qty 30

## 2018-02-07 MED ORDER — NITROGLYCERIN 0.4 MG SL SUBL: 0.4000 mg | SUBLINGUAL_TABLET | SUBLINGUAL | 1 refills | Status: DC | PRN

## 2018-02-07 SURGICAL SUPPLY — 12 items
CATH INFINITI 5 FR IM (CATHETERS) ×2 IMPLANT
CATH INFINITI 5FR MULTPACK ANG (CATHETERS) ×2 IMPLANT
COVER PRB 48X5XTLSCP FOLD TPE (BAG) ×1 IMPLANT
COVER PROBE 5X48 (BAG) ×1
HOVERMATT SINGLE USE (MISCELLANEOUS) ×2 IMPLANT
KIT HEART LEFT (KITS) ×2 IMPLANT
PACK CARDIAC CATHETERIZATION (CUSTOM PROCEDURE TRAY) ×2 IMPLANT
SHEATH PINNACLE 5F 10CM (SHEATH) ×2 IMPLANT
SYR MEDRAD MARK V 150ML (SYRINGE) ×2 IMPLANT
TRANSDUCER W/STOPCOCK (MISCELLANEOUS) ×2 IMPLANT
WIRE EMERALD 3MM-J .035X150CM (WIRE) ×2 IMPLANT
WIRE EMERALD 3MM-J .035X260CM (WIRE) ×2 IMPLANT

## 2018-02-07 NOTE — Progress Notes (Addendum)
Progress Note  Patient Name: [name] [name] Date of Encounter: 02/07/2018  Primary Cardiologist: Dina Rich, MD  Subjective   Feeling well, but anxious about cath today.   Inpatient Medications    Scheduled Meds: . amiodarone  400 mg Oral BID  . atorvastatin  20 mg Oral q1800  . chlorthalidone  37.5 mg Oral Daily  . diltiazem  360 mg Oral Daily  . irbesartan  300 mg Oral QHS  . multivitamin with minerals  1 tablet Oral Daily  . omega-3 acid ethyl esters  1 g Oral BID  . pantoprazole  40 mg Oral Daily  . sertraline  25 mg Oral Daily  . sodium chloride flush  3 mL Intravenous Q12H   Continuous Infusions: . sodium chloride    . sodium chloride    . heparin 1,400 Units/hr (02/07/18 0503)   PRN Meds: sodium chloride, acetaminophen, ALPRAZolam, metoprolol tartrate, nitroGLYCERIN, ondansetron (ZOFRAN) IV, sodium chloride flush   Vital Signs    Vitals:   02/06/18 0544 02/06/18 1534 02/06/18 2208 02/07/18 0423  BP: 117/87 138/65 (!) 144/64 137/65  Pulse: 63 64 66 79  Resp: 17 17 (!) 21   Temp: 97.6 F (36.4 C) 97.7 F (36.5 C) 97.8 F (36.6 C) 99 F (37.2 C)  TempSrc: Oral Oral Oral Oral  SpO2: 99% 97% 98% 97%  Weight: 249 lb 8 oz (113.2 kg)   248 lb 12.8 oz (112.9 kg)  Height:        Intake/Output Summary (Last 24 hours) at 02/07/2018 0747 Last data filed at 02/07/2018 0429 Gross per 24 hour  Intake 712 ml  Output 2125 ml  Net -1413 ml   Filed Weights   02/05/18 0656 02/06/18 0544 02/07/18 0423  Weight: 248 lb 6.4 oz (112.7 kg) 249 lb 8 oz (113.2 kg) 248 lb 12.8 oz (112.9 kg)    Telemetry    SR - Personally Reviewed  ECG    [name] - Personally Reviewed  Physical Exam   General: Well developed, well nourished, obese older W male appearing in no acute distress. Head: Normocephalic, atraumatic.  Neck: Supple without bruits, JVD. Lungs:  Resp regular and unlabored, CTA. Heart: RRR, S1, S2, no S3, S4, or murmur; no rub. Abdomen: Soft, non-tender,  non-distended with normoactive bowel sounds.  Extremities: No clubbing, cyanosis, edema. Distal pedal pulses are 2+ bilaterally. Neuro: Alert and oriented X 3. Moves all extremities spontaneously. Psych: Normal affect.  Labs    Chemistry Recent Labs  Lab 02/04/18 1512 02/05/18 0948  NA 141 137  K 4.7 3.6  CL 103 101  CO2 27 24  GLUCOSE 101* 137*  BUN 18 17  CREATININE 1.26* 1.16  CALCIUM 10.1 9.5  GFRNONAA 59* >60  GFRAA >60 >60  ANIONGAP 11 12     Hematology Recent Labs  Lab 02/04/18 1512 02/05/18 0948  WBC 12.9* 11.7*  RBC 5.49 5.26  HGB 16.3 15.4  HCT 47.0 44.8  MCV 85.6 85.2  MCH 29.7 29.3  MCHC 34.7 34.4  RDW 13.9 13.9  PLT 370 307    Cardiac Enzymes Recent Labs  Lab 02/04/18 2137 02/05/18 0227 02/05/18 0948  TROPONINI <0.03 <0.03 <0.03    Recent Labs  Lab 02/04/18 1519  TROPIPOC 0.02     BNP Recent Labs  Lab 02/04/18 1512  BNP 149.8*     DDimer No results for input(s): DDIMER in the last 168 hours.    Radiology    No results found.  Cardiac Studies  [name]   Patient Profile     67 y.o. male with history of CAD s/p 2-vessel CABG in 2004 at [facility], persistent Afib/flutter s/p prior ablation, asymptomatic bradycardia, HTN, HLD, chronic chest pain, obesity, OSA on CPAP, spinal stenosis, OA, and anxiety who was admitted to [facility] on 5/3 with unstable angina and persistent Afib/flutter.  Assessment & Plan    1. Unstable angina/CAD s/p CABG in 2004: Chest pain free, and ruled out. Remains on heparin gtt with plans for cath today.  -- The patient understands that risks included but are not limited to stroke (1 in 1000), death (1 in 1000), kidney failure [usually temporary] (1 in 500), bleeding (1 in 200), allergic reaction [possibly serious] (1 in 200).   2. Persistent Afib/flutter s/p remote ablation: Remains in sinus rhythm  -- Continue heparin gtt through cardiac cath on 5/6, then transition back to PTA Eliquis once stable from a  cardiac cath perspective -- on amiodraone 400 mg bid along with Cardizem and prn metoprolol  -- CHADS2VASc at least 2 (HTN, vascular disease)  3. Hypertensive heart disease: Blood pressure well controlled -- Continue chlorthalidone, Cardizem, and irbesartan   4. HLD: LDL of 102 from 02/05/2018 --He does not want to titrate Lipitor at this time secondary to myalgias with higher doses  -- Would benefit from evaluation at the [place] lipid clinic for possible PCSK-9 inhibitor as an outpatient   5. Obesity/OSA: Compliant with CPAP  6. Anxiety: on prn Xanax  -Continue PTA Zoloft   Signed, Laverda Page, NP  02/07/2018, 7:47 AM  Pager # [phone]   For questions or updates, please contact [facility] Please consult [email or web address] for contact info under Cardiology/STEMI.    I have examined the patient and reviewed assessment and plan and discussed with patient.  Agree with above as stated.  Cath showed patent grafts.  No PCI done.  Groin stable.  We discussed discharge later today. He is wondering is he will see Dr. Johney Frame while in the hospital regarding his AFib.  Will see what is EP plan.  Restart Eliquis tomorrow for stroke prevention.    Lance Muss

## 2018-02-07 NOTE — Interval H&P Note (Signed)
History and Physical Interval Note:  02/07/2018 8:13 AM  [name] [name]  has presented today for surgery, with the diagnosis of cp  The various methods of treatment have been discussed with the patient and family. After consideration of risks, benefits and other options for treatment, the patient has consented to  Procedure(s): LEFT HEART CATH AND CORS/GRAFTS ANGIOGRAPHY ([name]) as a surgical intervention .  The patient's history has been reviewed, patient examined, no change in status, stable for surgery.  I have reviewed the patient's chart and labs.  Questions were answered to the patient's satisfaction.   Cath Lab Visit (complete for each Cath Lab visit)  Clinical Evaluation Leading to the Procedure:   ACS: Yes.    Non-ACS:    Anginal Classification: CCS IV  Anti-ischemic medical therapy: Minimal Therapy (1 class of medications)  Non-Invasive Test Results: No non-invasive testing performed  Prior CABG: Previous CABG        Theron Arista [facility] 02/07/2018 8:13 AM

## 2018-02-07 NOTE — Progress Notes (Signed)
Patient refused pre-cath fluid started Aat 0600 "until talk to a Doctor"d/t to prior fluid overload issue he had and "transfer yo ICU" Geoffry Paradise, NP rounding reinforced the rationale of having IVF pre-cath. Agreed to hydration and IVF started at 0749. Mercede Rollo Ruthe Mannan, BSN, RN

## 2018-02-07 NOTE — Progress Notes (Signed)
Site area: rt groin fa sheath Site Prior to Removal:  Level 0 Pressure Applied For: 20 minutes Manual:   yes Patient Status During Pull:  stable Post Pull Site:  Level  0 Post Pull Instructions Given:  yes Post Pull Pulses Present: palpable Dressing Applied:  Gauze and tegaderm Bedrest begins @ 0940 Comments:   

## 2018-02-07 NOTE — Discharge Summary (Addendum)
Discharge Summary    Patient ID: [name] [name],  MRN: [id number], DOB/AGE: 1951-09-09 67 y.o.  Admit date: 02/04/2018 Discharge date: 02/07/2018  Primary Care Provider: Patient, No Pcp Per Primary Cardiologist: [name]  Discharge Diagnoses    Principal Problem:   Unstable angina [facility]) Active Problems:   Hyperlipidemia with target LDL less than 70   Obstructive sleep apnea   Obesity   Typical atrial flutter (HCC)   Hypertension   Allergies Allergies  Allergen Reactions  . Nsaids Other (See Comments)    On blood thinner  . Vancomycin Other (See Comments)    Was informed after heart surgery ? Breathing problem/patient unsure    Diagnostic Studies/Procedures    Cath: 02/07/18  Conclusion     Prox LAD lesion is 100% stenosed.  Prox Cx to Mid Cx lesion is 60% stenosed.  Post Atrio lesion is 100% stenosed.  SVG graft was visualized by angiography and is large.  The graft exhibits minimal luminal irregularities.  LIMA graft was visualized by angiography and is normal in caliber.  The graft exhibits no disease.  The left ventricular systolic function is normal.  LV end diastolic pressure is normal.  The left ventricular ejection fraction is 55-65% by visual estimate.   1. 2 vessel occlusive CAD    - 100% proximal LAD    - 60% mid LCx    - 100% PLOM with left to right collaterals. 2. Patent LIMA to the LAD 3. Patent SVG to the diagonal 4. Normal LV function 5. Normal LVEDP  Plan: continue medical management. May resume Eliquis this evening if no bleeding problems. The PLOM CTO is not suitable for PCI.  _____________   History of Present Illness     67 y.o. male who presented to the office on 02/04/18 for urgent electrophysiology followup. He had symptoms of palpitations chest pain, and SOB intermittently for the past 2 weeks prior to presenting to the hospital.  He felt that he had been having some afib episodes for the past 2 months. Episodes had  been increasing in frequency and duration since that time. Over the past 2-3 days prior to office visit, his chest pain had also increased in frequency and duration.  He felt that he had SOB and CP even when in sinus rhythm. He was found to be in persistent afib/atrial flutter with elevated heart rates. Given his symptoms he was sent to the hospital for direct admission.   Hospital Course     He was started on IV heparin and IV amiodarone. Troponins were cycled and negative. He underwent cardiac cath noted above with 2 vessel disease with patent LIMA-LAD, and SVG-Diag with normal LV function, and LVEDP. It was felt the [facility] CTO was not suitable for PCI. LDL noted at 102, but patient did not want to further titrate his statin as he had hx of myalgias with higher doses. Consider referral to the Lipid clinic for possible PCSK9s. He did convert to SR with IV amiodarone and was switched to oral dosing with 400mg  BID for 1 week, then 200mg  BID for 3 weeks then 200mg  daily. Will plan to have him resume his Eliquis the morning post cath. Follow up arranged in the Afib clinic prior to discharge.   [name] [name] was seen by Dr. Eldridge Dace and determined stable for discharge home. Follow up in the office has been arranged. Medications are listed below.   _____________  Discharge Vitals Blood pressure (!) 117/52, pulse 75,  temperature 98.4 F (36.9 C), temperature source Oral, resp. rate (!) 25, height 5\' 6"  (1.676 m), weight 248 lb 12.8 oz (112.9 kg), SpO2 97 %.  Filed Weights   02/05/18 0656 02/06/18 0544 02/07/18 0423  Weight: 248 lb 6.4 oz (112.7 kg) 249 lb 8 oz (113.2 kg) 248 lb 12.8 oz (112.9 kg)    Labs & Radiologic Studies    CBC Recent Labs    02/04/18 1512 02/05/18 0948  WBC 12.9* 11.7*  HGB 16.3 15.4  HCT 47.0 44.8  MCV 85.6 85.2  PLT 370 307   Basic Metabolic Panel Recent Labs    16/10/96 1512 02/05/18 0948  NA 141 137  K 4.7 3.6  CL 103 101  CO2 27 24  GLUCOSE 101* 137*  BUN  18 17  CREATININE 1.26* 1.16  CALCIUM 10.1 9.5   Liver Function Tests No results for input(s): AST, ALT, ALKPHOS, BILITOT, PROT, ALBUMIN in the last 72 hours. No results for input(s): LIPASE, AMYLASE in the last 72 hours. Cardiac Enzymes Recent Labs    02/04/18 2137 02/05/18 0227 02/05/18 0948  TROPONINI <0.03 <0.03 <0.03   BNP Invalid input(s): POCBNP D-Dimer No results for input(s): DDIMER in the last 72 hours. Hemoglobin A1C No results for input(s): HGBA1C in the last 72 hours. Fasting Lipid Panel Recent Labs    02/05/18 0227  CHOL 157  HDL 29*  LDLCALC 102*  TRIG 131  CHOLHDL 5.4   Thyroid Function Tests No results for input(s): TSH, T4TOTAL, T3FREE, THYROIDAB in the last 72 hours.  Invalid input(s): FREET3 _____________  Dg Chest 2 View  Result Date: 02/04/2018 CLINICAL DATA:  Chest pain and shortness of breath. History of atrial fibrillation. EXAM: CHEST - 2 VIEW COMPARISON:  Chest x-ray dated June 30, 2017. FINDINGS: Stable mild cardiomegaly status post CABG. Normal pulmonary vascularity. Minimal lower lobe subsegmental atelectasis bilaterally. No focal consolidation, pleural effusion, or pneumothorax. No acute osseous abnormality. IMPRESSION: No active cardiopulmonary disease. Electronically Signed   By: Obie Dredge M.D.   On: 02/04/2018 15:43   Disposition   Pt is being discharged home today in good condition.  Follow-up Plans & Appointments    Follow-up Information    Newman Nip, NP Follow up on 02/14/2018.   Specialties:  Nurse Practitioner, Cardiology Why:  at 11am for your follow up appt.  Contact information: [address] [place] [address] [phone]          Discharge Instructions    Call MD for:  redness, tenderness, or signs of infection (pain, swelling, redness, odor or green/yellow discharge around incision site)   Complete by:  As directed    Diet - low sodium heart healthy   Complete by:  As directed    Discharge  instructions   Complete by:  As directed    Groin Site Care Refer to this sheet in the next few weeks. These instructions provide you with information on caring for yourself after your procedure. Your caregiver may also give you more specific instructions. Your treatment has been planned according to current medical practices, but problems sometimes occur. Call your caregiver if you have any problems or questions after your procedure. HOME CARE INSTRUCTIONS You may shower 24 hours after the procedure. Remove the bandage (dressing) and gently wash the site with plain soap and water. Gently pat the site dry.  Do not apply powder or lotion to the site.  Do not sit in a bathtub, swimming pool, or whirlpool for  5 to 7 days.  No bending, squatting, or lifting anything over 10 pounds (4.5 kg) as directed by your caregiver.  Inspect the site at least twice daily.  Do not drive home if you are discharged the same day of the procedure. Have someone else drive you.  You may drive 24 hours after the procedure unless otherwise instructed by your caregiver.  What to expect: Any bruising will usually fade within 1 to 2 weeks.  Blood that collects in the tissue (hematoma) may be painful to the touch. It should usually decrease in size and tenderness within 1 to 2 weeks.  SEEK IMMEDIATE MEDICAL CARE IF: You have unusual pain at the groin site or down the affected leg.  You have redness, warmth, swelling, or pain at the groin site.  You have drainage (other than a small amount of blood on the dressing).  You have chills.  You have a fever or persistent symptoms for more than 72 hours.  You have a fever and your symptoms suddenly get worse.  Your leg becomes pale, cool, tingly, or numb.  You have heavy bleeding from the site. Hold pressure on the site. .  Please restart your Eliquis tomorrow morning.   Increase activity slowly   Complete by:  As directed      Discharge Medications     Medication List      TAKE these medications   acetaminophen 650 MG CR tablet Commonly known as:  TYLENOL Take 650 mg by mouth every 8 (eight) hours as needed for pain.   amiodarone 200 MG tablet Commonly known as:  PACERONE Take 400mg  twice a day for one week, then 200mg  twice a day for 3 weeks, then 200mg  daily.   apixaban 5 MG Tabs tablet Commonly known as:  ELIQUIS Take 1 tablet (5 mg total) by mouth 2 (two) times daily.   atorvastatin 20 MG tablet Commonly known as:  LIPITOR Take 1 tablet (20 mg total) by mouth daily. What changed:  when to take this   chlorthalidone 25 MG tablet Commonly known as:  HYGROTON Take 1.5 tablets (37.5 mg total) by mouth daily.   Co Q 10 100 MG Caps Take 400 mg by mouth daily.   diltiazem 360 MG 24 hr capsule Commonly known as:  CARDIZEM CD Take 1 capsule (360 mg total) by mouth daily.   fish oil-omega-3 fatty acids 1000 MG capsule Take 1 g by mouth 2 (two) times daily.   irbesartan 300 MG tablet Commonly known as:  AVAPRO Take 1 tablet (300 mg total) by mouth at bedtime.   metoprolol tartrate 25 MG tablet Commonly known as:  LOPRESSOR Take 12.5 mg by mouth 2 (two) times daily as needed (a-fib).   multivitamin tablet Take 1 tablet by mouth daily.   nitroGLYCERIN 0.4 MG SL tablet Commonly known as:  NITROSTAT Place 1 tablet (0.4 mg total) under the tongue every 5 (five) minutes x 3 doses as needed for chest pain.   omeprazole 20 MG capsule Commonly known as:  PRILOSEC Take 1 capsule (20 mg total) by mouth every other day.   PRESCRIPTION MEDICATION Pt uses CPAP machine at bedtime   sertraline 25 MG tablet Commonly known as:  ZOLOFT Take 1 tablet (25 mg total) by mouth daily.       Outstanding Labs/Studies   [name]   Duration of Discharge Encounter   Greater than 30 minutes including physician time.  Signed, Laverda Page NP-C 02/07/2018, 2:21 PM   I have  examined the patient and reviewed assessment and plan and discussed with  patient.  Agree with above as stated.  No PCI needed. Had discussion with Dr. Johney Frame per the patient's request.  No need for further inpatient admission.  Maintaining NSR at this time.  Amiodarone called in.    He is very appreciative of the care he received.    Lance Muss

## 2018-02-07 NOTE — Discharge Instructions (Signed)

## 2018-02-08 NOTE — Consult Note (Signed)
            [facility] CM Primary Care Navigator  02/08/2018  [name] [name] 12-25-1950 [id number]   Attempt toseepatient at the bedside to identify possible discharge needsbuthe was already dischargedhome.  Patient was seen and treated for chest pain and shortness of breath, persistent atrial fibrillation/ atrial flutter with elevated heart rates, unstable angina.  Primary care provider's office (Dr. Rudi Coco with [facility])is listed as providingtransition of care (TOC)follow-up.   Patient has discharge instruction to follow-up withcardiology/ primary care provider on 02/14/18.   For additional questions please contact:  Karin Golden A. Stephonie Wilcoxen, BSN, RN-BC [facility] PRIMARY CARE Navigator Cell: [phone]

## 2018-02-09 MED FILL — Heparin Sod (Porcine)-NaCl IV Soln 1000 Unit/500ML-0.9%: INTRAVENOUS | Qty: 1000 | Status: AC

## 2018-02-14 ENCOUNTER — Encounter (HOSPITAL_COMMUNITY): Payer: Self-pay | Admitting: Nurse Practitioner

## 2018-02-14 ENCOUNTER — Ambulatory Visit (HOSPITAL_COMMUNITY)
Admit: 2018-02-14 | Discharge: 2018-02-14 | Disposition: A | Discharge status: Home / Self Care | Payer: Medicare HMO | Attending: Nurse Practitioner | Admitting: Nurse Practitioner

## 2018-02-14 VITALS — BP 162/74 | HR 54 | Ht 66.0 in | Wt 252.2 lb

## 2018-02-14 DIAGNOSIS — Z823 Family history of stroke: Secondary | ICD-10-CM | POA: Diagnosis not present

## 2018-02-14 DIAGNOSIS — M1990 Unspecified osteoarthritis, unspecified site: Secondary | ICD-10-CM | POA: Diagnosis not present

## 2018-02-14 DIAGNOSIS — Z886 Allergy status to analgesic agent status: Secondary | ICD-10-CM | POA: Diagnosis not present

## 2018-02-14 DIAGNOSIS — I480 Paroxysmal atrial fibrillation: Secondary | ICD-10-CM | POA: Diagnosis not present

## 2018-02-14 DIAGNOSIS — G4733 Obstructive sleep apnea (adult) (pediatric): Secondary | ICD-10-CM | POA: Insufficient documentation

## 2018-02-14 DIAGNOSIS — I252 Old myocardial infarction: Secondary | ICD-10-CM | POA: Insufficient documentation

## 2018-02-14 DIAGNOSIS — F419 Anxiety disorder, unspecified: Secondary | ICD-10-CM | POA: Insufficient documentation

## 2018-02-14 DIAGNOSIS — Z951 Presence of aortocoronary bypass graft: Secondary | ICD-10-CM | POA: Diagnosis not present

## 2018-02-14 DIAGNOSIS — Z803 Family history of malignant neoplasm of breast: Secondary | ICD-10-CM | POA: Diagnosis not present

## 2018-02-14 DIAGNOSIS — I10 Essential (primary) hypertension: Secondary | ICD-10-CM | POA: Insufficient documentation

## 2018-02-14 DIAGNOSIS — Z96652 Presence of left artificial knee joint: Secondary | ICD-10-CM | POA: Diagnosis not present

## 2018-02-14 DIAGNOSIS — Z9989 Dependence on other enabling machines and devices: Secondary | ICD-10-CM | POA: Diagnosis not present

## 2018-02-14 DIAGNOSIS — I2510 Atherosclerotic heart disease of native coronary artery without angina pectoris: Secondary | ICD-10-CM | POA: Insufficient documentation

## 2018-02-14 DIAGNOSIS — Z881 Allergy status to other antibiotic agents status: Secondary | ICD-10-CM | POA: Insufficient documentation

## 2018-02-14 DIAGNOSIS — E669 Obesity, unspecified: Secondary | ICD-10-CM | POA: Insufficient documentation

## 2018-02-14 DIAGNOSIS — I483 Typical atrial flutter: Secondary | ICD-10-CM | POA: Insufficient documentation

## 2018-02-14 DIAGNOSIS — Z8249 Family history of ischemic heart disease and other diseases of the circulatory system: Secondary | ICD-10-CM | POA: Diagnosis not present

## 2018-02-14 DIAGNOSIS — Z9889 Other specified postprocedural states: Secondary | ICD-10-CM | POA: Insufficient documentation

## 2018-02-14 DIAGNOSIS — Z79899 Other long term (current) drug therapy: Secondary | ICD-10-CM | POA: Diagnosis not present

## 2018-02-14 DIAGNOSIS — Z7901 Long term (current) use of anticoagulants: Secondary | ICD-10-CM | POA: Diagnosis not present

## 2018-02-14 DIAGNOSIS — R69 Illness, unspecified: Secondary | ICD-10-CM | POA: Diagnosis not present

## 2018-02-14 DIAGNOSIS — E785 Hyperlipidemia, unspecified: Secondary | ICD-10-CM | POA: Diagnosis not present

## 2018-02-14 NOTE — Progress Notes (Signed)
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 He was seen in [place] with Dr. Johney Frame 5/3 and was having afib with RVR and chest pain. He was directed to go to [facility] for admission. He was started on  amiodarone and had a LHC, which showed stable coronary disease.   In the afib clinic for f/u, he is in SR. He feels improved. No further chest pain. He is asking if he might have repeat ablation and will get f/u with Dr. Johney Frame to discuss. Per his note of 5/3, short term amio and repeat ablation  seemed to be the plan.  Today, he denies symptoms of palpitations, chest pain, shortness of breath, orthopnea, PND, lower extremity edema, dizziness, presyncope, syncope, or neurologic sequela. The patient is tolerating medications without difficulties and is otherwise without complaint today.   Past Medical History:  Diagnosis Date  . Anxiety   . Coronary artery disease    2v CABG, 2004 [facility])  . DJD (degenerative joint disease)   . HLD (hyperlipidemia)   . Hypertension   . MI, old 2004  . OA (osteoarthritis)   . Obesity   . PAF (paroxysmal atrial fibrillation) (HCC)    a. started on sotalol 08/2016.  Marland Kitchen Sleep apnea    on CPAP  . Spinal stenosis   . Typical atrial flutter [facility])    Past Surgical History:  Procedure Laterality Date  . BACK SURGERY  2011  . CARPAL TUNNEL RELEASE     bilateral  . CORONARY ARTERY BYPASS GRAFT     2004  . ELECTROPHYSIOLOGIC STUDY [name] 09/15/2016   Procedure: Atrial Fibrillation Ablation;  Surgeon: Hillis Range, MD;  Location: [facility] INVASIVE CV LAB;  Service: Cardiovascular;  Laterality: [name];  . fistula surgery    . hemorrhoidectomy    . LEFT HEART CATH AND CORS/GRAFTS ANGIOGRAPHY [name] 02/07/2018   Procedure: LEFT HEART CATH AND CORS/GRAFTS ANGIOGRAPHY;  Surgeon: [name], Peter M, MD;  Location: [facility] INVASIVE CV LAB;  Service: Cardiovascular;   Laterality: [name];  . LUMBAR LAMINECTOMY    . right foot fracture    . TEE WITHOUT CARDIOVERSION [name] 09/15/2016   Procedure: TRANSESOPHAGEAL ECHOCARDIOGRAM (TEE);  Surgeon: Lewayne Bunting, MD;  Location: [facility] ENDOSCOPY;  Service: Cardiovascular;  Laterality: [name];  . TOTAL KNEE ARTHROPLASTY Left 07/07/2017   Procedure: LEFT TOTAL KNEE ARTHROPLASTY;  Surgeon: Ranee Gosselin, MD;  Location: [facility] ORS;  Service: Orthopedics;  Laterality: Left;  . TRANSTHORACIC ECHOCARDIOGRAM  08/25/2010   EF 55-60%    Current Outpatient Medications  Medication Sig Dispense Refill  . acetaminophen (TYLENOL) 650 MG CR tablet Take 650 mg by mouth every 8 (eight) hours as needed for pain.    Marland Kitchen amiodarone (PACERONE) 200 MG tablet Take 400mg  twice a day for one week, then 200mg  twice a day for 3 weeks, then 200mg  daily. 90 tablet 0  . apixaban (ELIQUIS) 5 MG TABS tablet Take 1 tablet (5 mg total) by mouth 2 (two) times daily. 180 tablet 1  . atorvastatin (LIPITOR) 20 MG tablet Take 1 tablet (20 mg total) by mouth daily. (Patient taking differently: Take 20 mg by mouth daily at 6 PM. ) 90 tablet 1  . chlorthalidone (HYGROTON) 25 MG tablet Take 1.5 tablets (37.5 mg total) by mouth daily. 135 tablet 1  . Coenzyme Q10 (CO Q 10) 100 MG CAPS Take  400 mg by mouth daily.     Marland Kitchen diltiazem (CARDIZEM CD) 360 MG 24 hr capsule Take 1 capsule (360 mg total) by mouth daily. 90 capsule 1  . fish oil-omega-3 fatty acids 1000 MG capsule Take 1 g by mouth 2 (two) times daily.     . irbesartan (AVAPRO) 300 MG tablet Take 1 tablet (300 mg total) by mouth at bedtime. 90 tablet 1  . metoprolol tartrate (LOPRESSOR) 25 MG tablet Take 12.5 mg by mouth 2 (two) times daily as needed (a-fib).     . Multiple Vitamin (MULTIVITAMIN) tablet Take 1 tablet by mouth daily.    . nitroGLYCERIN (NITROSTAT) 0.4 MG SL tablet Place 1 tablet (0.4 mg total) under the tongue every 5 (five) minutes x 3 doses as needed for chest pain. 25 tablet 1  . omeprazole  (PRILOSEC) 20 MG capsule Take 1 capsule (20 mg total) by mouth every other day. 90 capsule 1  . PRESCRIPTION MEDICATION Pt uses CPAP machine at bedtime    . sertraline (ZOLOFT) 25 MG tablet Take 1 tablet (25 mg total) by mouth daily. 90 tablet 3   No current facility-administered medications for this encounter.     Allergies  Allergen Reactions  . Nsaids Other (See Comments)    On blood thinner  . Vancomycin Other (See Comments)    Was informed after heart surgery ? Breathing problem/patient unsure    Social History   Socioeconomic History  . Marital status: Married    Spouse name: Not on file  . Number of children: 0  . Years of education: Not on file  . Highest education level: Not on file  Occupational History  . Occupation: Architect: [employer]  Social Needs  . Financial resource strain: Not on file  . Food insecurity:    Worry: Not on file    Inability: Not on file  . Transportation needs:    Medical: Not on file    Non-medical: Not on file  Tobacco Use  . Smoking status: Never Smoker  . Smokeless tobacco: Never Used  Substance and Sexual Activity  . Alcohol use: No    Alcohol/week: 0.0 oz  . Drug use: No  . Sexual activity: Yes    Partners: Female  Lifestyle  . Physical activity:    Days per week: 7 days    Minutes per session: 30 min  . Stress: Only a little  Relationships  . Social connections:    Talks on phone: Not on file    Gets together: Not on file    Attends religious service: Not on file    Active member of club or organization: Not on file    Attends meetings of clubs or organizations: Not on file    Relationship status: Not on file  . Intimate partner violence:    Fear of current or ex partner: Not on file    Emotionally abused: Not on file    Physically abused: Not on file    Forced sexual activity: Not on file  Other Topics Concern  . Not on file  Social History Narrative  . Not on file    Family  History  Problem Relation Age of Onset  . Breast cancer Mother   . Hypertension Mother   . Coronary artery disease Father   . Stroke Father   . Hypertension Father     ROS- All systems are reviewed and negative except as per the HPI above  Physical Exam: Vitals:   02/14/18 1040  BP: (!) 162/74  Pulse: (!) 54  Weight: 252 lb 3.2 oz (114.4 kg)  Height: 5\' 6"  (1.676 m)   Wt Readings from Last 3 Encounters:  02/14/18 252 lb 3.2 oz (114.4 kg)  02/07/18 248 lb 12.8 oz (112.9 kg)  02/04/18 249 lb 6.4 oz (113.1 kg)    Labs: Lab Results  Component Value Date   NA 137 02/05/2018   K 3.6 02/05/2018   CL 101 02/05/2018   CO2 24 02/05/2018   GLUCOSE 137 (H) 02/05/2018   BUN 17 02/05/2018   CREATININE 1.16 02/05/2018   CALCIUM 9.5 02/05/2018   MG 2.0 09/01/2016   Lab Results  Component Value Date   INR 1.14 06/30/2017   Lab Results  Component Value Date   CHOL 157 02/05/2018   HDL 29 (L) 02/05/2018   LDLCALC 102 (H) 02/05/2018   TRIG 131 02/05/2018     GEN- The patient is well appearing, alert and oriented x 3 today.   Head- normocephalic, atraumatic Eyes-  Sclera clear, conjunctiva pink Ears- hearing intact Oropharynx- clear Neck- supple, no JVP Lymph- no cervical lymphadenopathy Lungs- Clear to ausculation bilaterally, normal work of breathing Heart- Regular rate and rhythm, no murmurs, rubs or gallops, PMI not laterally displaced GI- soft, NT, ND, + BS Extremities- no clubbing, cyanosis, or edema MS- no significant deformity or atrophy Skin- no rash or lesion Psych- euthymic mood, full affect Neuro- strength and sensation are intact  EKG-Sinus brady at 54 bpm, pr int 196 ms, qrs int 106 ms, qtc 462 ms Epic records reviewed    Assessment and Plan: 1. Paroxysmal afib In SR  with amiodarone, discussed to be a bridge to repeat ablation Will reduce amio to 200 mg bid tommorrow which he will continue 3 weeks and then reduce to 200 mg qd Continue eliquis 5 mg  bid for a chadvasc score of at least 2  2. CAD No further chest pain Cath showed stable CAD, medical management recommended Continue statin  3. HTN States white coat hypertension BP at home around 130 mg systolic per pt  F/u with Dr. Johney Frame 6/10 in the afib clinic   Lupita Leash C. Matthew Folks [facility] [address] [place], [place] [address] [phone]

## 2018-02-17 ENCOUNTER — Telehealth (HOSPITAL_COMMUNITY): Payer: Self-pay | Admitting: *Deleted

## 2018-02-17 MED ORDER — FUROSEMIDE 20 MG PO TABS
ORAL_TABLET | ORAL | 0 refills | Status: DC
Start: 2018-02-17 — End: 2018-05-25

## 2018-02-17 NOTE — Telephone Encounter (Signed)
Patient called in stating since Monday he has noticed increased swelling in hands and ankles. He is not having any shortness of breath or issues with orthopnea. He watches his diet of salty foods and fluid intake. Discussed with Gypsy Balsam, NP -- will give PRN lasix for 2 days then only as needed. PT in agreement with plan.

## 2018-03-01 ENCOUNTER — Other Ambulatory Visit: Payer: Self-pay | Admitting: Cardiology

## 2018-03-14 ENCOUNTER — Encounter (HOSPITAL_COMMUNITY): Payer: Self-pay | Admitting: Nurse Practitioner

## 2018-03-14 ENCOUNTER — Ambulatory Visit (HOSPITAL_COMMUNITY)
Admission: RE | Admit: 2018-03-14 | Discharge: 2018-03-14 | Disposition: A | Discharge status: Home / Self Care | Payer: Medicare HMO | Source: Ambulatory Visit | Attending: Nurse Practitioner | Admitting: Nurse Practitioner

## 2018-03-14 VITALS — BP 142/66 | HR 68 | Temp 99.1°F | Ht 66.0 in | Wt 251.0 lb

## 2018-03-14 DIAGNOSIS — I10 Essential (primary) hypertension: Secondary | ICD-10-CM

## 2018-03-14 DIAGNOSIS — M1990 Unspecified osteoarthritis, unspecified site: Secondary | ICD-10-CM | POA: Insufficient documentation

## 2018-03-14 DIAGNOSIS — F419 Anxiety disorder, unspecified: Secondary | ICD-10-CM | POA: Insufficient documentation

## 2018-03-14 DIAGNOSIS — I483 Typical atrial flutter: Secondary | ICD-10-CM | POA: Diagnosis not present

## 2018-03-14 DIAGNOSIS — I252 Old myocardial infarction: Secondary | ICD-10-CM | POA: Diagnosis not present

## 2018-03-14 DIAGNOSIS — I4581 Long QT syndrome: Secondary | ICD-10-CM | POA: Insufficient documentation

## 2018-03-14 DIAGNOSIS — I481 Persistent atrial fibrillation: Secondary | ICD-10-CM | POA: Insufficient documentation

## 2018-03-14 DIAGNOSIS — E669 Obesity, unspecified: Secondary | ICD-10-CM | POA: Insufficient documentation

## 2018-03-14 DIAGNOSIS — G4733 Obstructive sleep apnea (adult) (pediatric): Secondary | ICD-10-CM

## 2018-03-14 DIAGNOSIS — Z79899 Other long term (current) drug therapy: Secondary | ICD-10-CM | POA: Insufficient documentation

## 2018-03-14 DIAGNOSIS — I4819 Other persistent atrial fibrillation: Secondary | ICD-10-CM

## 2018-03-14 DIAGNOSIS — E785 Hyperlipidemia, unspecified: Secondary | ICD-10-CM | POA: Insufficient documentation

## 2018-03-14 DIAGNOSIS — Z7901 Long term (current) use of anticoagulants: Secondary | ICD-10-CM | POA: Insufficient documentation

## 2018-03-14 DIAGNOSIS — Z951 Presence of aortocoronary bypass graft: Secondary | ICD-10-CM | POA: Insufficient documentation

## 2018-03-14 DIAGNOSIS — I2510 Atherosclerotic heart disease of native coronary artery without angina pectoris: Secondary | ICD-10-CM | POA: Insufficient documentation

## 2018-03-14 DIAGNOSIS — R69 Illness, unspecified: Secondary | ICD-10-CM | POA: Diagnosis not present

## 2018-03-14 MED ORDER — AMIODARONE HCL 200 MG PO TABS: 200.0000 mg | ORAL_TABLET | Freq: Every day | ORAL | 3 refills | Status: DC

## 2018-03-14 NOTE — Progress Notes (Signed)
Electrophysiology Office Note   Date:  03/14/2018   ID:  TAHER VANNOTE, DOB [birth year], MRN [id number]  PCP:  Patient, No Pcp Per    Primary Electrophysiologist: Hillis Range, MD    CC: afib   History of Present Illness: [name] [name] is a 67 y.o. male who presents today for afib clinic follow-up.   He was recently hospitalized with recurrent afib and chest pain.  Cath was reviewed.  Medical management advised.  He is doing better.  He is very anxious.  He worries about risks of amiodarone long term and wishes to stop this medicine if possible. He has mild edema for which he has been taking diuretics.  Today, he denies symptoms of palpitations, chest pain, shortness of breath, orthopnea, PND, claudication, dizziness, presyncope, syncope, bleeding, or neurologic sequela. The patient is tolerating medications without difficulties and is otherwise without complaint today.    Past Medical History:  Diagnosis Date  . Anxiety   . Coronary artery disease    2v CABG, 2004 [facility])  . DJD (degenerative joint disease)   . HLD (hyperlipidemia)   . Hypertension   . MI, old 2004  . OA (osteoarthritis)   . Obesity   . PAF (paroxysmal atrial fibrillation) (HCC)    a. started on sotalol 08/2016.  Marland Kitchen Sleep apnea    on CPAP  . Spinal stenosis   . Typical atrial flutter [facility])    Past Surgical History:  Procedure Laterality Date  . BACK SURGERY  2011  . CARPAL TUNNEL RELEASE     bilateral  . CORONARY ARTERY BYPASS GRAFT     2004  . ELECTROPHYSIOLOGIC STUDY [name] 09/15/2016   Procedure: Atrial Fibrillation Ablation;  Surgeon: Hillis Range, MD;  Location: [facility] INVASIVE CV LAB;  Service: Cardiovascular;  Laterality: [name];  . fistula surgery    . hemorrhoidectomy    . LEFT HEART CATH AND CORS/GRAFTS ANGIOGRAPHY [name] 02/07/2018   Procedure: LEFT HEART CATH AND CORS/GRAFTS ANGIOGRAPHY;  Surgeon: [name], Peter M, MD;  Location: [facility] INVASIVE CV LAB;  Service: Cardiovascular;  Laterality: [name];  .  LUMBAR LAMINECTOMY    . right foot fracture    . TEE WITHOUT CARDIOVERSION [name] 09/15/2016   Procedure: TRANSESOPHAGEAL ECHOCARDIOGRAM (TEE);  Surgeon: Lewayne Bunting, MD;  Location: [facility] ENDOSCOPY;  Service: Cardiovascular;  Laterality: [name];  . TOTAL KNEE ARTHROPLASTY Left 07/07/2017   Procedure: LEFT TOTAL KNEE ARTHROPLASTY;  Surgeon: Ranee Gosselin, MD;  Location: [facility] ORS;  Service: Orthopedics;  Laterality: Left;  . TRANSTHORACIC ECHOCARDIOGRAM  08/25/2010   EF 55-60%     Current Outpatient Medications  Medication Sig Dispense Refill  . acetaminophen (TYLENOL) 650 MG CR tablet Take 650 mg by mouth every 8 (eight) hours as needed for pain.    Marland Kitchen amiodarone (PACERONE) 200 MG tablet Take 200 mg by mouth daily.    Marland Kitchen apixaban (ELIQUIS) 5 MG TABS tablet Take 1 tablet (5 mg total) by mouth 2 (two) times daily. 180 tablet 1  . atorvastatin (LIPITOR) 20 MG tablet Take 1 tablet (20 mg total) by mouth daily. (Patient taking differently: Take 20 mg by mouth daily at 6 PM. ) 90 tablet 1  . chlorthalidone (HYGROTON) 25 MG tablet Take 1.5 tablets (37.5 mg total) by mouth daily. 135 tablet 1  . Coenzyme Q10 (CO Q 10) 100 MG CAPS Take 400 mg by mouth daily.     Marland Kitchen diltiazem (CARDIZEM CD) 360 MG 24 hr capsule Take 1 capsule (360 mg total) by  mouth daily. 90 capsule 1  . fish oil-omega-3 fatty acids 1000 MG capsule Take 1 g by mouth 2 (two) times daily.     . irbesartan (AVAPRO) 300 MG tablet Take 1 tablet (300 mg total) by mouth at bedtime. 90 tablet 1  . metoprolol tartrate (LOPRESSOR) 25 MG tablet Take 12.5 mg by mouth 2 (two) times daily as needed (a-fib).     . Multiple Vitamin (MULTIVITAMIN) tablet Take 1 tablet by mouth daily.    . nitroGLYCERIN (NITROSTAT) 0.4 MG SL tablet Place 1 tablet (0.4 mg total) under the tongue every 5 (five) minutes x 3 doses as needed for chest pain. 25 tablet 1  . omeprazole (PRILOSEC) 20 MG capsule Take 1 capsule (20 mg total) by mouth every other day. 90 capsule 1  .  PRESCRIPTION MEDICATION Pt uses CPAP machine at bedtime    . sertraline (ZOLOFT) 25 MG tablet Take 1 tablet (25 mg total) by mouth daily. 90 tablet 3  . furosemide (LASIX) 20 MG tablet Take 1 tablet by mouth daily for 2 days then only as needed for swelling/weight gain (Patient not taking: Reported on 03/14/2018) 10 tablet 0   No current facility-administered medications for this encounter.     Allergies:   Nsaids and Vancomycin   Social History:  The patient  reports that he has never smoked. He has never used smokeless tobacco. He reports that he does not drink alcohol or use drugs.   Family History:  The patient's  family history includes Breast cancer in his mother; Coronary artery disease in his father; Hypertension in his father and mother; Stroke in his father.    ROS:  Please see the history of present illness.   All other systems are personally reviewed and negative.    PHYSICAL EXAM: VS:  BP (!) 142/66 (BP Location: Right Arm, Patient Position: Sitting, Cuff Size: Large)   Pulse 68   Temp 99.1 F (37.3 C) (Oral)   Ht 5\' 6"  (1.676 m)   Wt 251 lb (113.9 kg)   BMI 40.51 kg/m  , BMI Body mass index is 40.51 kg/m. GEN: Well nourished, well developed, in no acute distress  HEENT: normal  Neck: no JVD, carotid bruits, or masses Cardiac: RRR; no murmurs, rubs, or gallops,no edema  Respiratory:  clear to auscultation bilaterally, normal work of breathing GI: soft, nontender, nondistended, + BS MS: no deformity or atrophy  Skin: warm and dry  Neuro:  Strength and sensation are intact Psych: euthymic mood, full affect  EKG:  EKG is ordered today. The ekg ordered today is personally reviewed and shows sinus rhythm 68 bpm, PR 190 msec, QRS 116 msec, Qtc 482 msec, incomplete RBBB   Recent Labs: 06/30/2017: ALT 22 02/04/2018: B Natriuretic Peptide 149.8 02/05/2018: BUN 17; Creatinine, Ser 1.16; Hemoglobin 15.4; Platelets 307; Potassium 3.6; Sodium 137  personally reviewed   Lipid  Panel     Component Value Date/Time   CHOL 157 02/05/2018 0227   TRIG 131 02/05/2018 0227   HDL 29 (L) 02/05/2018 0227   CHOLHDL 5.4 02/05/2018 0227   VLDL 26 02/05/2018 0227   LDLCALC 102 (H) 02/05/2018 0227   personally reviewed   Wt Readings from Last 3 Encounters:  03/14/18 251 lb (113.9 kg)  02/14/18 252 lb 3.2 oz (114.4 kg)  02/07/18 248 lb 12.8 oz (112.9 kg)      Other studies personally reviewed: Additional studies/ records that were reviewed today include: prior AF clinic notes, recent hospital records,  prior echo  Review of the above records today demonstrates: as above   ASSESSMENT AND PLAN:  1.  Persistent afib Doing reasonably well Worries about risks of amiodarone long term.  Chads2vasc score is 2.  he is anticoagulated with eliquis. Therapeutic strategies for afib including medicine and ablation were discussed in detail with the patient today. Risk, benefits, and alternatives to EP study and radiofrequency ablation for afib were also discussed in detail today. These risks include but are not limited to stroke, bleeding, vascular damage, tamponade, perforation, damage to the esophagus, lungs, and other structures, pulmonary vein stenosis, worsening renal function, and death. The patient understands these risk and wishes to proceed.  We will therefore proceed with catheter ablation at the next available time.  Carto, ICE, anesthesia are requested for the procedure.  Will also obtain cardiac CT prior to the procedure to exclude LAA thrombus and further evaluate atrial anatomy.  2. OSA Compliant with CPAP  3. HTN Stable No change required today  4. CAD No ischemic symptoms Medical management   Current medicines are reviewed at length with the patient today.   The patient does not have concerns regarding his medicines.  The following changes were made today:  none  Labs/ tests ordered today include:  Orders Placed This Encounter  Procedures  . EKG 12-Lead       Signed, Hillis Range, MD  03/14/2018 10:25 AM     [facility] [address] [place] [address] [phone] (office) [phone] (fax)

## 2018-03-15 ENCOUNTER — Other Ambulatory Visit (HOSPITAL_COMMUNITY): Payer: Self-pay | Admitting: *Deleted

## 2018-03-15 ENCOUNTER — Encounter (HOSPITAL_COMMUNITY): Payer: Self-pay | Admitting: *Deleted

## 2018-03-15 ENCOUNTER — Other Ambulatory Visit: Payer: Self-pay

## 2018-03-15 ENCOUNTER — Ambulatory Visit (HOSPITAL_COMMUNITY)
Admission: EM | Admit: 2018-03-15 | Discharge: 2018-03-15 | Disposition: A | Discharge status: Home / Self Care | Payer: Medicare HMO | Attending: Family Medicine | Admitting: Family Medicine

## 2018-03-15 ENCOUNTER — Encounter (HOSPITAL_COMMUNITY): Payer: Self-pay | Admitting: Emergency Medicine

## 2018-03-15 DIAGNOSIS — N390 Urinary tract infection, site not specified: Secondary | ICD-10-CM | POA: Diagnosis not present

## 2018-03-15 DIAGNOSIS — N410 Acute prostatitis: Secondary | ICD-10-CM

## 2018-03-15 DIAGNOSIS — Z79899 Other long term (current) drug therapy: Secondary | ICD-10-CM | POA: Insufficient documentation

## 2018-03-15 DIAGNOSIS — I4819 Other persistent atrial fibrillation: Secondary | ICD-10-CM

## 2018-03-15 LAB — POCT URINALYSIS DIP (DEVICE)
BILIRUBIN URINE: NEGATIVE
Glucose, UA: NEGATIVE mg/dL
Ketones, ur: NEGATIVE mg/dL
Nitrite: POSITIVE — AB
Protein, ur: 100 mg/dL — AB
Specific Gravity, Urine: 1.02 (ref 1.005–1.030)
UROBILINOGEN UA: 1 mg/dL (ref 0.0–1.0)
pH: 5.5 (ref 5.0–8.0)

## 2018-03-15 MED ORDER — SULFAMETHOXAZOLE-TRIMETHOPRIM 800-160 MG PO TABS: 1.0000 | ORAL_TABLET | Freq: Two times a day (BID) | ORAL | 0 refills | Status: AC

## 2018-03-15 NOTE — Discharge Instructions (Signed)
Start bactrim as directed. Continue to monitor your symptoms, if experiencing worsening weakness, dizziness, passing out, go to the emergency department for further evaluation. Otherwise, follow up with your PCP for recheck in 7 days.

## 2018-03-15 NOTE — ED Provider Notes (Signed)
[facility]    CSN: [id number] Arrival date & time: 03/15/18  1000     History   Chief Complaint Chief Complaint  Patient presents with  . Urinary Frequency    HPI [name] [name] is a 67 y.o. male.   67 year old male with history of afib on eliquis, BPH, CAD, HLD, HTN comes in for 3 day history of UTI symptoms. States he did have a dose of lasix 3 days ago as he anticipated standing the whole day for a graduation. However, since then, he has had urinary frequency, dysuria, urine odor. Denies hematuria. Denies abdominal pain, nausea, vomiting. Has had subjective fever and chills starting 2 days ago. States has not felt like he had an appetite, and therefore has not been eating as much. Denies back pain, constipation, diarrhea. Had normal BM yesterday. Denies URI symptoms such as cough, congestion, sore throat. Does have posterior headache that is dull, worsened with sneezing/coughing. Tylenol with some relief. Denies worse headache in the world. Denies chest pain, palpitations. Had dizziness yesterday that resolved. States he does feel weak, as if he cannot walk, denies syncope. States has shortness of breath at baseline, denies worsening. Denies leg swelling, orthopnea.      Past Medical History:  Diagnosis Date  . Anxiety   . Coronary artery disease    2v CABG, 2004 [facility])  . DJD (degenerative joint disease)   . HLD (hyperlipidemia)   . Hypertension   . MI, old 2004  . OA (osteoarthritis)   . Obesity   . PAF (paroxysmal atrial fibrillation) (HCC)    a. started on sotalol 08/2016.  Marland Kitchen Sleep apnea    on CPAP  . Spinal stenosis   . Typical atrial flutter [facility])     Patient Active Problem List   Diagnosis Date Noted  . Unstable angina (HCC) 02/04/2018  . Typical atrial flutter (HCC)   . Spinal stenosis   . Sleep apnea   . QT prolongation   . Palpitations   . PAF (paroxysmal atrial fibrillation) (HCC)   . OA (osteoarthritis)   . Hypertension   . HLD  (hyperlipidemia)   . Edema   . DJD (degenerative joint disease)   . Coronary artery disease   . Chronic chest pain   . 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 A-fib (HCC) 09/15/2016  . Chest pain 09/09/2016  . Obesity 08/22/2016  . Anxiety 08/22/2016  . Encounter for monitoring sotalol therapy 08/20/2016  . Morbidly obese (HCC) 07/12/2016  . Medical non-compliance 07/12/2016  . Elevated TSH 07/09/2016  . BPH without urinary obstruction 07/09/2016  . Need for hepatitis C screening test 07/09/2016  . Idiopathic gout 07/09/2016  . Hyperglycemia 08/01/2015  . Atrial fibrillation with RVR (HCC) 03/28/2015  . Routine general medical examination at a health care facility 08/11/2013  . Chronic sinus bradycardia   . Obstructive sleep apnea 09/12/2010  . MYOCARDIAL INFARCTION 09/11/2010  . Spinal stenosis, unspecified region other than cervical 09/11/2010  . Hyperlipidemia with target LDL less than 70 09/08/2010  . Depression 09/08/2010  . Essential hypertension 09/08/2010  . Coronary atherosclerosis 09/08/2010  . Congestive heart failure (HCC) 09/08/2010  . GERD 09/08/2010  . MI, old 10/05/2002    Past Surgical History:  Procedure Laterality Date  . BACK SURGERY  2011  . CARPAL TUNNEL RELEASE     bilateral  . CORONARY ARTERY BYPASS GRAFT     2004  . ELECTROPHYSIOLOGIC STUDY [name] 09/15/2016  Procedure: Atrial Fibrillation Ablation;  Surgeon: Hillis Range, MD;  Location: [facility] INVASIVE CV LAB;  Service: Cardiovascular;  Laterality: [name];  . fistula surgery    . hemorrhoidectomy    . LEFT HEART CATH AND CORS/GRAFTS ANGIOGRAPHY [name] 02/07/2018   Procedure: LEFT HEART CATH AND CORS/GRAFTS ANGIOGRAPHY;  Surgeon: [name], Peter M, MD;  Location: [facility] INVASIVE CV LAB;  Service: Cardiovascular;  Laterality: [name];  . LUMBAR LAMINECTOMY    . right foot fracture    . TEE WITHOUT CARDIOVERSION [name] 09/15/2016   Procedure: TRANSESOPHAGEAL ECHOCARDIOGRAM (TEE);  Surgeon: Lewayne Bunting, MD;   Location: [facility] ENDOSCOPY;  Service: Cardiovascular;  Laterality: [name];  . TOTAL KNEE ARTHROPLASTY Left 07/07/2017   Procedure: LEFT TOTAL KNEE ARTHROPLASTY;  Surgeon: Ranee Gosselin, MD;  Location: [facility]S;  Service: Orthopedics;  Laterality: Left;  . TRANSTHORACIC ECHOCARDIOGRAM  08/25/2010   EF 55-60%       Home Medications    Prior to Admission medications   Medication Sig Start Date End Date Taking? Authorizing Provider  acetaminophen (TYLENOL) 650 MG CR tablet Take 650 mg by mouth every 8 (eight) hours as needed for pain.   Yes [provider]  amiodarone (PACERONE) 200 MG tablet Take 1 tablet (200 mg total) by mouth daily. 03/14/18  Yes Allred, Fayrene Fearing, MD  apixaban (ELIQUIS) 5 MG TABS tablet Take 1 tablet (5 mg total) by mouth 2 (two) times daily. 11/03/17  Yes BranchDorothe Pea, MD  atorvastatin (LIPITOR) 20 MG tablet Take 1 tablet (20 mg total) by mouth daily. Patient taking differently: Take 20 mg by mouth daily at 6 PM.  11/03/17  Yes Branch, Dorothe Pea, MD  chlorthalidone (HYGROTON) 25 MG tablet Take 1.5 tablets (37.5 mg total) by mouth daily. 11/03/17  Yes BranchDorothe Pea, MD  Coenzyme Q10 (CO Q 10) 100 MG CAPS Take 400 mg by mouth daily.    Yes [provider]  diltiazem (CARDIZEM CD) 360 MG 24 hr capsule Take 1 capsule (360 mg total) by mouth daily. 11/03/17  Yes BranchDorothe Pea, MD  fish oil-omega-3 fatty acids 1000 MG capsule Take 1 g by mouth 2 (two) times daily.    Yes [provider]  furosemide (LASIX) 20 MG tablet Take 1 tablet by mouth daily for 2 days then only as needed for swelling/weight gain 02/17/18  Yes Seiler, [facility] K, NP  irbesartan (AVAPRO) 300 MG tablet Take 1 tablet (300 mg total) by mouth at bedtime. 11/03/17 11/03/18 Yes Branch, Dorothe Pea, MD  metoprolol tartrate (LOPRESSOR) 25 MG tablet Take 12.5 mg by mouth 2 (two) times daily as needed (a-fib).    Yes [provider]  Multiple Vitamin (MULTIVITAMIN) tablet Take 1 tablet  by mouth daily.   Yes [provider]  omeprazole (PRILOSEC) 20 MG capsule Take 1 capsule (20 mg total) by mouth every other day. 11/03/17  Yes BranchDorothe Pea, MD  PRESCRIPTION MEDICATION Pt uses CPAP machine at bedtime   Yes [provider]  sertraline (ZOLOFT) 25 MG tablet Take 1 tablet (25 mg total) by mouth daily. 11/03/17  Yes Branch, Dorothe Pea, MD  nitroGLYCERIN (NITROSTAT) 0.4 MG SL tablet Place 1 tablet (0.4 mg total) under the tongue every 5 (five) minutes x 3 doses as needed for chest pain. 02/07/18   Arty Baumgartner, NP  sulfamethoxazole-trimethoprim (BACTRIM DS,SEPTRA DS) 800-160 MG tablet Take 1 tablet by mouth 2 (two) times daily for 14 days. 03/15/18 03/29/18  Belinda Fisher, PA-C  Family History Family History  Problem Relation Age of Onset  . Breast cancer Mother   . Hypertension Mother   . Coronary artery disease Father   . Stroke Father   . Hypertension Father     Social History Social History   Tobacco Use  . Smoking status: Never Smoker  . Smokeless tobacco: Never Used  Substance Use Topics  . Alcohol use: No    Alcohol/week: 0.0 oz  . Drug use: No     Allergies   Nsaids and Vancomycin   Review of Systems Review of Systems  Reason unable to perform ROS: See HPI as above.     Physical Exam Triage Vital Signs ED Triage Vitals  Enc Vitals Group     BP 03/15/18 1017 126/86     Pulse Rate 03/15/18 1017 (!) 120     Resp --      Temp 03/15/18 1017 98.9 F (37.2 C)     Temp Source 03/15/18 1017 Oral     SpO2 03/15/18 1017 94 %     Weight --      Height --      Head Circumference --      Peak Flow --      Pain Score 03/15/18 1011 0     Pain Loc --      Pain Edu? --      Excl. in GC? --    No data found.  Updated Vital Signs BP 126/86 (BP Location: Right Arm)   Pulse (!) 120   Temp 98.9 F (37.2 C) (Oral)   SpO2 94%   Physical Exam  Constitutional: He is oriented to person, place, and time. He appears well-developed  and well-nourished. No distress.  HENT:  Head: Normocephalic and atraumatic.  Right Ear: Tympanic membrane, external ear and ear canal normal. Tympanic membrane is not erythematous and not bulging.  Left Ear: Tympanic membrane, external ear and ear canal normal. Tympanic membrane is not erythematous and not bulging.  Nose: Nose normal. Right sinus exhibits no maxillary sinus tenderness and no frontal sinus tenderness. Left sinus exhibits no maxillary sinus tenderness and no frontal sinus tenderness.  Mouth/Throat: Uvula is midline, oropharynx is clear and moist and mucous membranes are normal.  Eyes: Pupils are equal, round, and reactive to light. Conjunctivae and EOM are normal.  Neck: Normal range of motion. Neck supple.  Cardiovascular: Regular rhythm and normal heart sounds. Tachycardia present. Exam reveals no gallop and no friction rub.  No murmur heard. Pulmonary/Chest: Effort normal and breath sounds normal. He has no decreased breath sounds. He has no wheezes. He has no rhonchi. He has no rales.  Abdominal: Soft. Bowel sounds are normal. He exhibits no mass. There is no tenderness. There is no rebound and no guarding.  Lymphadenopathy:    He has no cervical adenopathy.  Neurological: He is alert and oriented to person, place, and time. He has normal strength. He is not disoriented. Coordination and gait normal. GCS eye subscore is 4. GCS verbal subscore is 5. GCS motor subscore is 6.  Able to get on and off exam table on own without problems.   Skin: Skin is warm and dry.  Psychiatric: He has a normal mood and affect. His behavior is normal. Judgment normal.     UC Treatments / Results  Labs (all labs ordered are listed, but only abnormal results are displayed) Labs Reviewed  POCT URINALYSIS DIP (DEVICE) - Abnormal; Notable for the following components:  Result Value   Hgb urine dipstick MODERATE (*)    Protein, ur 100 (*)    Nitrite POSITIVE (*)    Leukocytes, UA SMALL  (*)    All other components within normal limits  URINE CULTURE    EKG None  Radiology No results found.  Procedures Procedures (including critical care time)  Medications Ordered in UC Medications - No data to display  Initial Impression / Assessment and Plan / UC Course  I have reviewed the triage vital signs and the nursing notes.  Pertinent labs & imaging results that were available during my care of the patient were reviewed by me and considered in my medical decision making (see chart for details).    Will cover for prostatitis given age, history of BPH.  Start Bactrim as directed.  Return precautions given.  Otherwise follow-up in 7 days with PCP for recheck.  Final Clinical Impressions(s) / UC Diagnoses   Final diagnoses:  Lower urinary tract infectious disease    ED Prescriptions    Medication Sig Dispense Auth. Provider   sulfamethoxazole-trimethoprim (BACTRIM DS,SEPTRA DS) 800-160 MG tablet Take 1 tablet by mouth 2 (two) times daily for 14 days. 28 tablet Threasa Alpha, [place] 03/15/18 1121

## 2018-03-15 NOTE — ED Triage Notes (Signed)
Pt has been experiencing urinary frequency and urinary odor x3 days.  He also reports hot & cold flashes and a headache.

## 2018-03-16 ENCOUNTER — Telehealth (HOSPITAL_COMMUNITY): Payer: Self-pay | Admitting: *Deleted

## 2018-03-16 NOTE — Telephone Encounter (Signed)
Pt notified of afib ablation scheduling for 7/3. Cardiac CT prior to. NPO after MN. No meds morning of procedure. Labs on 6/21. Pt verbalized understanding of instructions.

## 2018-03-17 ENCOUNTER — Ambulatory Visit (HOSPITAL_COMMUNITY)
Admission: RE | Admit: 2018-03-17 | Discharge: 2018-03-17 | Disposition: A | Discharge status: Home / Self Care | Payer: Medicare HMO | Source: Ambulatory Visit | Attending: Internal Medicine | Admitting: Internal Medicine

## 2018-03-17 DIAGNOSIS — I509 Heart failure, unspecified: Secondary | ICD-10-CM | POA: Insufficient documentation

## 2018-03-17 DIAGNOSIS — G4733 Obstructive sleep apnea (adult) (pediatric): Secondary | ICD-10-CM | POA: Insufficient documentation

## 2018-03-17 DIAGNOSIS — I110 Hypertensive heart disease with heart failure: Secondary | ICD-10-CM | POA: Diagnosis not present

## 2018-03-17 DIAGNOSIS — E785 Hyperlipidemia, unspecified: Secondary | ICD-10-CM | POA: Insufficient documentation

## 2018-03-17 DIAGNOSIS — I4819 Other persistent atrial fibrillation: Secondary | ICD-10-CM

## 2018-03-17 DIAGNOSIS — I4892 Unspecified atrial flutter: Secondary | ICD-10-CM | POA: Insufficient documentation

## 2018-03-17 DIAGNOSIS — I219 Acute myocardial infarction, unspecified: Secondary | ICD-10-CM | POA: Diagnosis not present

## 2018-03-17 DIAGNOSIS — I481 Persistent atrial fibrillation: Secondary | ICD-10-CM | POA: Insufficient documentation

## 2018-03-17 LAB — URINE CULTURE

## 2018-03-17 NOTE — Progress Notes (Signed)
*  PRELIMINARY RESULTS* Echocardiogram 2D Echocardiogram has been performed.  Stacey Drain 03/17/2018, 9:29 AM

## 2018-03-18 ENCOUNTER — Telehealth (HOSPITAL_COMMUNITY): Payer: Self-pay

## 2018-03-18 NOTE — Telephone Encounter (Signed)
Urine culture was positive for E. Coli and was given Bactrim at urgent care visit. Pt contacted and made aware, educated on completing antibiotic and to follow up if symptoms are persistent. Verbalized understanding.  

## 2018-03-25 ENCOUNTER — Other Ambulatory Visit: Payer: Medicare HMO | Admitting: *Deleted

## 2018-03-25 DIAGNOSIS — I481 Persistent atrial fibrillation: Secondary | ICD-10-CM | POA: Diagnosis not present

## 2018-03-25 DIAGNOSIS — I4819 Other persistent atrial fibrillation: Secondary | ICD-10-CM

## 2018-03-25 LAB — CBC WITH DIFFERENTIAL/PLATELET
Basophils Absolute: 0.1 10*3/uL (ref 0.0–0.2)
Basos: 1 %
EOS (ABSOLUTE): 0.4 10*3/uL (ref 0.0–0.4)
EOS: 4 %
HEMATOCRIT: 41 % (ref 37.5–51.0)
Hemoglobin: 14.1 g/dL (ref 13.0–17.7)
IMMATURE GRANULOCYTES: 2 %
Immature Grans (Abs): 0.2 10*3/uL — ABNORMAL HIGH (ref 0.0–0.1)
LYMPHS ABS: 2.6 10*3/uL (ref 0.7–3.1)
Lymphs: 21 %
MCH: 28.9 pg (ref 26.6–33.0)
MCHC: 34.4 g/dL (ref 31.5–35.7)
MCV: 84 fL (ref 79–97)
MONOS ABS: 0.8 10*3/uL (ref 0.1–0.9)
Monocytes: 6 %
NEUTROS PCT: 66 %
Neutrophils Absolute: 8.2 10*3/uL — ABNORMAL HIGH (ref 1.4–7.0)
PLATELETS: 443 10*3/uL (ref 150–450)
RBC: 4.88 x10E6/uL (ref 4.14–5.80)
RDW: 14 % (ref 12.3–15.4)
WBC: 12.3 10*3/uL — AB (ref 3.4–10.8)

## 2018-03-25 LAB — BASIC METABOLIC PANEL
BUN/Creatinine Ratio: 14 (ref 10–24)
BUN: 20 mg/dL (ref 8–27)
CHLORIDE: 102 mmol/L (ref 96–106)
CO2: 21 mmol/L (ref 20–29)
Calcium: 9.9 mg/dL (ref 8.6–10.2)
Creatinine, Ser: 1.46 mg/dL — ABNORMAL HIGH (ref 0.76–1.27)
GFR, EST AFRICAN AMERICAN: 58 mL/min/{1.73_m2} — AB (ref >59)
GFR, EST NON AFRICAN AMERICAN: 50 mL/min/{1.73_m2} — AB (ref >59)
Glucose: 109 mg/dL — ABNORMAL HIGH (ref 65–99)
Potassium: 4.5 mmol/L (ref 3.5–5.2)
SODIUM: 137 mmol/L (ref 134–144)

## 2018-03-31 ENCOUNTER — Encounter (HOSPITAL_COMMUNITY): Payer: Self-pay | Admitting: Emergency Medicine

## 2018-03-31 ENCOUNTER — Other Ambulatory Visit: Payer: Self-pay

## 2018-03-31 ENCOUNTER — Ambulatory Visit (HOSPITAL_COMMUNITY): Payer: Medicare HMO

## 2018-03-31 ENCOUNTER — Ambulatory Visit (HOSPITAL_COMMUNITY)
Admission: EM | Admit: 2018-03-31 | Discharge: 2018-03-31 | Disposition: A | Discharge status: Home / Self Care | Payer: Medicare HMO | Attending: Family Medicine | Admitting: Family Medicine

## 2018-03-31 ENCOUNTER — Ambulatory Visit (HOSPITAL_COMMUNITY)
Admission: RE | Admit: 2018-03-31 | Discharge: 2018-03-31 | Disposition: A | Discharge status: Home / Self Care | Payer: Medicare HMO | Source: Ambulatory Visit | Attending: Internal Medicine | Admitting: Internal Medicine

## 2018-03-31 DIAGNOSIS — I480 Paroxysmal atrial fibrillation: Secondary | ICD-10-CM | POA: Diagnosis not present

## 2018-03-31 DIAGNOSIS — Z8249 Family history of ischemic heart disease and other diseases of the circulatory system: Secondary | ICD-10-CM | POA: Insufficient documentation

## 2018-03-31 DIAGNOSIS — Z96652 Presence of left artificial knee joint: Secondary | ICD-10-CM | POA: Insufficient documentation

## 2018-03-31 DIAGNOSIS — Z09 Encounter for follow-up examination after completed treatment for conditions other than malignant neoplasm: Secondary | ICD-10-CM | POA: Diagnosis not present

## 2018-03-31 DIAGNOSIS — R079 Chest pain, unspecified: Secondary | ICD-10-CM | POA: Diagnosis not present

## 2018-03-31 DIAGNOSIS — Z79899 Other long term (current) drug therapy: Secondary | ICD-10-CM | POA: Diagnosis not present

## 2018-03-31 DIAGNOSIS — Z87438 Personal history of other diseases of male genital organs: Secondary | ICD-10-CM

## 2018-03-31 DIAGNOSIS — M47814 Spondylosis without myelopathy or radiculopathy, thoracic region: Secondary | ICD-10-CM | POA: Insufficient documentation

## 2018-03-31 DIAGNOSIS — I110 Hypertensive heart disease with heart failure: Secondary | ICD-10-CM | POA: Diagnosis not present

## 2018-03-31 DIAGNOSIS — K219 Gastro-esophageal reflux disease without esophagitis: Secondary | ICD-10-CM | POA: Insufficient documentation

## 2018-03-31 DIAGNOSIS — M1990 Unspecified osteoarthritis, unspecified site: Secondary | ICD-10-CM | POA: Insufficient documentation

## 2018-03-31 DIAGNOSIS — R001 Bradycardia, unspecified: Secondary | ICD-10-CM | POA: Diagnosis not present

## 2018-03-31 DIAGNOSIS — F329 Major depressive disorder, single episode, unspecified: Secondary | ICD-10-CM | POA: Diagnosis not present

## 2018-03-31 DIAGNOSIS — G4733 Obstructive sleep apnea (adult) (pediatric): Secondary | ICD-10-CM | POA: Insufficient documentation

## 2018-03-31 DIAGNOSIS — N390 Urinary tract infection, site not specified: Secondary | ICD-10-CM | POA: Diagnosis not present

## 2018-03-31 DIAGNOSIS — I219 Acute myocardial infarction, unspecified: Secondary | ICD-10-CM | POA: Diagnosis not present

## 2018-03-31 DIAGNOSIS — N400 Enlarged prostate without lower urinary tract symptoms: Secondary | ICD-10-CM | POA: Insufficient documentation

## 2018-03-31 DIAGNOSIS — I483 Typical atrial flutter: Secondary | ICD-10-CM | POA: Insufficient documentation

## 2018-03-31 DIAGNOSIS — I509 Heart failure, unspecified: Secondary | ICD-10-CM | POA: Insufficient documentation

## 2018-03-31 DIAGNOSIS — Z803 Family history of malignant neoplasm of breast: Secondary | ICD-10-CM | POA: Insufficient documentation

## 2018-03-31 DIAGNOSIS — I481 Persistent atrial fibrillation: Secondary | ICD-10-CM | POA: Insufficient documentation

## 2018-03-31 DIAGNOSIS — I4891 Unspecified atrial fibrillation: Secondary | ICD-10-CM | POA: Diagnosis not present

## 2018-03-31 DIAGNOSIS — Z7983 Long term (current) use of bisphosphonates: Secondary | ICD-10-CM | POA: Insufficient documentation

## 2018-03-31 DIAGNOSIS — Z886 Allergy status to analgesic agent status: Secondary | ICD-10-CM | POA: Diagnosis not present

## 2018-03-31 DIAGNOSIS — I4819 Other persistent atrial fibrillation: Secondary | ICD-10-CM

## 2018-03-31 DIAGNOSIS — Z881 Allergy status to other antibiotic agents status: Secondary | ICD-10-CM | POA: Insufficient documentation

## 2018-03-31 DIAGNOSIS — I25110 Atherosclerotic heart disease of native coronary artery with unstable angina pectoris: Secondary | ICD-10-CM | POA: Insufficient documentation

## 2018-03-31 DIAGNOSIS — G8929 Other chronic pain: Secondary | ICD-10-CM | POA: Diagnosis not present

## 2018-03-31 DIAGNOSIS — R739 Hyperglycemia, unspecified: Secondary | ICD-10-CM | POA: Insufficient documentation

## 2018-03-31 DIAGNOSIS — I252 Old myocardial infarction: Secondary | ICD-10-CM | POA: Insufficient documentation

## 2018-03-31 DIAGNOSIS — Z9889 Other specified postprocedural states: Secondary | ICD-10-CM | POA: Insufficient documentation

## 2018-03-31 DIAGNOSIS — E785 Hyperlipidemia, unspecified: Secondary | ICD-10-CM | POA: Diagnosis not present

## 2018-03-31 DIAGNOSIS — Z7901 Long term (current) use of anticoagulants: Secondary | ICD-10-CM | POA: Insufficient documentation

## 2018-03-31 DIAGNOSIS — Z823 Family history of stroke: Secondary | ICD-10-CM | POA: Insufficient documentation

## 2018-03-31 DIAGNOSIS — Z951 Presence of aortocoronary bypass graft: Secondary | ICD-10-CM | POA: Diagnosis not present

## 2018-03-31 DIAGNOSIS — R350 Frequency of micturition: Secondary | ICD-10-CM | POA: Diagnosis not present

## 2018-03-31 LAB — POCT URINALYSIS DIP (DEVICE)
Bilirubin Urine: NEGATIVE
GLUCOSE, UA: NEGATIVE mg/dL
Ketones, ur: NEGATIVE mg/dL
Leukocytes, UA: NEGATIVE
Nitrite: NEGATIVE
PROTEIN: NEGATIVE mg/dL
SPECIFIC GRAVITY, URINE: 1.01 (ref 1.005–1.030)
UROBILINOGEN UA: 0.2 mg/dL (ref 0.0–1.0)
pH: 6 (ref 5.0–8.0)

## 2018-03-31 MED ORDER — IOPAMIDOL (ISOVUE-370) INJECTION 76%
INTRAVENOUS | Status: AC
  Filled 2018-03-31: qty 100

## 2018-03-31 MED ORDER — IOPAMIDOL (ISOVUE-370) INJECTION 76%
100.0000 mL | Freq: Once | INTRAVENOUS | Status: AC | PRN
  Administered 2018-03-31: 80 mL via INTRAVENOUS

## 2018-03-31 NOTE — ED Triage Notes (Signed)
Onset of uti was two weeks ago.  Finished antibiotic on Monday.  This is a suggested follow up visit.  Patient still feels like he has to urinate frequently, but no pain

## 2018-03-31 NOTE — ED Provider Notes (Signed)
[facility]    CSN: [id number] Arrival date & time: 03/31/18  1211     History   Chief Complaint Chief Complaint  Patient presents with  . Urinary Tract Infection  . Follow-up    HPI [name] [name] is a 67 y.o. male.   [name] [name] presents with complaints of follow up s/p lower urinary tract infection. Was treated with 14 day course of bactrim, started it on 6/11. Completed two days ago. Does not have a PCP. States feels significantly better, does not have to get up at all during the night to urinate anymore. No pain. No fevers. Eating and drinking normally. No blood in urine. States still feels a pressure sensation like after he urinates he still needs to go. Denies any prostate history, per chart review does indicate BPH history. Hx of afib, upcoming scheduled ablation. Hx oc CAD and CABD, htn, MI, OA, sleep apnea.     ROS per HPI.      Past Medical History:  Diagnosis Date  . Anxiety   . Coronary artery disease    2v CABG, 2004 [facility])  . DJD (degenerative joint disease)   . HLD (hyperlipidemia)   . Hypertension   . MI, old 2004  . OA (osteoarthritis)   . Obesity   . PAF (paroxysmal atrial fibrillation) (HCC)    a. started on sotalol 08/2016.  Marland Kitchen Sleep apnea    on CPAP  . Spinal stenosis   . Typical atrial flutter [facility])     Patient Active Problem List   Diagnosis Date Noted  . Unstable angina (HCC) 02/04/2018  . Typical atrial flutter (HCC)   . Spinal stenosis   . Sleep apnea   . QT prolongation   . Palpitations   . PAF (paroxysmal atrial fibrillation) (HCC)   . OA (osteoarthritis)   . Hypertension   . HLD (hyperlipidemia)   . Edema   . DJD (degenerative joint disease)   . Coronary artery disease   . Chronic chest pain   . 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 A-fib (HCC) 09/15/2016  . Chest pain 09/09/2016  . Obesity 08/22/2016  . Anxiety 08/22/2016  . Encounter for monitoring sotalol therapy 08/20/2016  . Morbidly obese (HCC)  07/12/2016  . Medical non-compliance 07/12/2016  . Elevated TSH 07/09/2016  . BPH without urinary obstruction 07/09/2016  . Need for hepatitis C screening test 07/09/2016  . Idiopathic gout 07/09/2016  . Hyperglycemia 08/01/2015  . Atrial fibrillation with RVR (HCC) 03/28/2015  . Routine general medical examination at a health care facility 08/11/2013  . Chronic sinus bradycardia   . Obstructive sleep apnea 09/12/2010  . MYOCARDIAL INFARCTION 09/11/2010  . Spinal stenosis, unspecified region other than cervical 09/11/2010  . Hyperlipidemia with target LDL less than 70 09/08/2010  . Depression 09/08/2010  . Essential hypertension 09/08/2010  . Coronary atherosclerosis 09/08/2010  . Congestive heart failure (HCC) 09/08/2010  . GERD 09/08/2010  . MI, old 10/05/2002    Past Surgical History:  Procedure Laterality Date  . BACK SURGERY  2011  . CARPAL TUNNEL RELEASE     bilateral  . CORONARY ARTERY BYPASS GRAFT     2004  . ELECTROPHYSIOLOGIC STUDY [name] 09/15/2016   Procedure: Atrial Fibrillation Ablation;  Surgeon: Hillis Range, MD;  Location: [facility] INVASIVE CV LAB;  Service: Cardiovascular;  Laterality: [name];  . fistula surgery    . hemorrhoidectomy    . LEFT HEART CATH AND CORS/GRAFTS ANGIOGRAPHY [name] 02/07/2018   Procedure:  LEFT HEART CATH AND CORS/GRAFTS ANGIOGRAPHY;  Surgeon: [name], Peter M, MD;  Location: [facility] INVASIVE CV LAB;  Service: Cardiovascular;  Laterality: [name];  . LUMBAR LAMINECTOMY    . right foot fracture    . TEE WITHOUT CARDIOVERSION [name] 09/15/2016   Procedure: TRANSESOPHAGEAL ECHOCARDIOGRAM (TEE);  Surgeon: Lewayne Bunting, MD;  Location: [facility] ENDOSCOPY;  Service: Cardiovascular;  Laterality: [name];  . TOTAL KNEE ARTHROPLASTY Left 07/07/2017   Procedure: LEFT TOTAL KNEE ARTHROPLASTY;  Surgeon: Ranee Gosselin, MD;  Location: [facility] ORS;  Service: Orthopedics;  Laterality: Left;  . TRANSTHORACIC ECHOCARDIOGRAM  08/25/2010   EF 55-60%       Home Medications    Prior to  Admission medications   Medication Sig Start Date End Date Taking? Authorizing Provider  acetaminophen (TYLENOL) 650 MG CR tablet Take 650 mg by mouth every 8 (eight) hours as needed for pain.    [provider]  amiodarone (PACERONE) 200 MG tablet Take 1 tablet (200 mg total) by mouth daily. 03/14/18   Allred, Fayrene Fearing, MD  apixaban (ELIQUIS) 5 MG TABS tablet Take 1 tablet (5 mg total) by mouth 2 (two) times daily. 11/03/17   Antoine Poche, MD  atorvastatin (LIPITOR) 20 MG tablet Take 1 tablet (20 mg total) by mouth daily. Patient taking differently: Take 20 mg by mouth daily at 6 PM.  11/03/17   Branch, Dorothe Pea, MD  chlorthalidone (HYGROTON) 25 MG tablet Take 1.5 tablets (37.5 mg total) by mouth daily. 11/03/17   Antoine Poche, MD  Coenzyme Q10 (CO Q 10) 100 MG CAPS Take 400 mg by mouth daily.     [provider]  diltiazem (CARDIZEM CD) 360 MG 24 hr capsule Take 1 capsule (360 mg total) by mouth daily. 11/03/17   Antoine Poche, MD  fish oil-omega-3 fatty acids 1000 MG capsule Take 1 g by mouth 2 (two) times daily.     [provider]  furosemide (LASIX) 20 MG tablet Take 1 tablet by mouth daily for 2 days then only as needed for swelling/weight gain 02/17/18   Gypsy Balsam K, NP  irbesartan (AVAPRO) 300 MG tablet Take 1 tablet (300 mg total) by mouth at bedtime. 11/03/17 11/03/18  Antoine Poche, MD  metoprolol tartrate (LOPRESSOR) 25 MG tablet Take 12.5 mg by mouth 2 (two) times daily as needed (a-fib).     [provider]  Multiple Vitamin (MULTIVITAMIN) tablet Take 1 tablet by mouth daily.    [provider]  nitroGLYCERIN (NITROSTAT) 0.4 MG SL tablet Place 1 tablet (0.4 mg total) under the tongue every 5 (five) minutes x 3 doses as needed for chest pain. 02/07/18   Arty Baumgartner, NP  omeprazole (PRILOSEC) 20 MG capsule Take 1 capsule (20 mg total) by mouth every other day. 11/03/17   Antoine Poche, MD  PRESCRIPTION MEDICATION Pt  uses CPAP machine at bedtime    [provider]  sertraline (ZOLOFT) 25 MG tablet Take 1 tablet (25 mg total) by mouth daily. 11/03/17   Antoine Poche, MD    Family History Family History  Problem Relation Age of Onset  . Breast cancer Mother   . Hypertension Mother   . Coronary artery disease Father   . Stroke Father   . Hypertension Father     Social History Social History   Tobacco Use  . Smoking status: Never Smoker  . Smokeless tobacco: Never Used  Substance Use Topics  . Alcohol use: No  Alcohol/week: 0.0 oz  . Drug use: No     Allergies   Nsaids and Vancomycin   Review of Systems Review of Systems   Physical Exam Triage Vital Signs ED Triage Vitals  Enc Vitals Group     BP 03/31/18 1228 (!) 150/63     Pulse Rate 03/31/18 1228 (!) 51     Resp 03/31/18 1228 20     Temp 03/31/18 1228 (!) 97.4 F (36.3 C)     Temp Source 03/31/18 1228 Oral     SpO2 03/31/18 1228 97 %     Weight --      Height --      Head Circumference --      Peak Flow --      Pain Score 03/31/18 1226 0     Pain Loc --      Pain Edu? --      Excl. in GC? --    No data found.  Updated Vital Signs BP (!) 150/63 (BP Location: Right Arm) Comment (BP Location): large cuff  Pulse (!) 51   Temp (!) 97.4 F (36.3 C) (Oral)   Resp 20   SpO2 97%   Physical Exam  Constitutional: He is oriented to person, place, and time. He appears well-developed and well-nourished.  Cardiovascular: Normal heart sounds and intact distal pulses.  Pulmonary/Chest: Effort normal and breath sounds normal.  Abdominal: Soft. Bowel sounds are normal. There is no tenderness. There is no CVA tenderness.  Neurological: He is alert and oriented to person, place, and time.  Skin: Skin is warm and dry.     UC Treatments / Results  Labs (all labs ordered are listed, but only abnormal results are displayed) Labs Reviewed  POCT URINALYSIS DIP (DEVICE) - Abnormal; Notable for the following  components:      Result Value   Hgb urine dipstick TRACE (*)    All other components within normal limits  URINE CULTURE    EKG None  Radiology Ct Cardiac Morph/pulm Vein W/cm&w/o Ca Score  Result Date: 03/31/2018 CLINICAL DATA:  Pre Ablation EXAM: Cardiac Gated CTA TECHNIQUE: The patient was scanned on a Siemens Force 192 slice scanner. Gantry rotation speed was 250 msec with a temporal resolution of 66 msec. A prospective scan was triggered in the ascending thoracic aorta at 140 HU's Data sets were reconstructed with full mA between 35% and 75% of the R-R interval Images were reviewed using VRT, MIP and MPR modes. Double oblique images were used to measure the PV diameter and areas. The patient received 80 cc of contrast at 5 cc/sec CONTRAST:  Isovue 370 total 80 cc COMPARISON:  None FINDINGS: There was mild bi atrial enlargement. There was no obvious PFO/ASD. The aortic root was normal 3.3 cm. The esophagus courses closest to the RLPV ostium. There was no pericardial effusion. PV anatomy was normal with a near common ostium of all of the right sided veins There was no LAA thrombus LUPV:  Ostium 14.8 mm   area 2.1 cm2 LLPV:   Ostium 15 mm     area 1.1 cm2 RUPV:  Ostium 14.4 mm  area 1.5 cm2 RMPV: Ostium 10 mm        area .84 cm2 RLPV:  Ostium 10.2 mm  area .83 cm2 Calcium score not performed due to previous interventions and CABG. There appeared to be a patent LIMA to LAD and patent SVG to Diagonal branch IMPRESSION: 1.  No LAA thrombus 2.  Mild bi  atrial enlargement 3. Normal PV anatomy dimensions as above near common ostium for right sided veins 4.  No pericardial effusion 5.  Esophagus courses closest to the RLPV 6.  Normal aortic root 3.3 cm 7. Appears to be patent LIMA to LAD and patent SVG to Diagonal branch Electronically Signed   By: Charlton Haws M.D.   On: 03/31/2018 12:35    Procedures Procedures (including critical care time)  Medications Ordered in UC Medications - No data to  display  Initial Impression / Assessment and Plan / UC Course  I have reviewed the triage vital signs and the nursing notes.  Pertinent labs & imaging results that were available during my care of the patient were reviewed by me and considered in my medical decision making (see chart for details).     UA with trace Hgb, no leuks or nitrite. Significant improvement of symptoms. He is on eliquis. Urine culture obtained for confirmation. Discussed follow up plan with patient, he does not have a PCP and has been following with cardiology, upcoming ablation. He wishes to wait to follow up with PCP or urology until after ablation. Discussed BPH may be contributing to vague persistent symptoms. Encouraged recheck for persistent symptoms. Return precautions provided. Patient verbalized understanding and agreeable to plan.    Final Clinical Impressions(s) / UC Diagnoses   Final diagnoses:  Follow-up examination  Urinary frequency  History of BPH     Discharge Instructions     Your urine looks much improved today without any indications of infection, I have sent this to be cultured as well to confirm this. Continue to drink plenty of water to empty bladder regularly. I would recommend establishing with a primary care provider for continued management as if continue to feel the urinary frequency prostate evaluation may be warranted.  If develop return of pain, blood in urine, frequency or otherwise worsening please return to urgent care.     ED Prescriptions    None     Controlled Substance Prescriptions  Controlled Substance Registry consulted? Not Applicable   Georgetta Haber, NP 03/31/18 1300

## 2018-03-31 NOTE — Discharge Instructions (Signed)
Your urine looks much improved today without any indications of infection, I have sent this to be cultured as well to confirm this. Continue to drink plenty of water to empty bladder regularly. I would recommend establishing with a primary care provider for continued management as if continue to feel the urinary frequency prostate evaluation may be warranted.  If develop return of pain, blood in urine, frequency or otherwise worsening please return to urgent care.

## 2018-04-01 LAB — URINE CULTURE

## 2018-04-04 ENCOUNTER — Encounter (HOSPITAL_COMMUNITY): Payer: Self-pay | Admitting: Emergency Medicine

## 2018-04-04 ENCOUNTER — Emergency Department (HOSPITAL_COMMUNITY)
Admission: EM | Admit: 2018-04-04 | Discharge: 2018-04-05 | Disposition: A | Discharge status: Home / Self Care | Payer: Medicare HMO | Attending: Emergency Medicine | Admitting: Emergency Medicine

## 2018-04-04 ENCOUNTER — Other Ambulatory Visit: Payer: Self-pay

## 2018-04-04 DIAGNOSIS — R1030 Lower abdominal pain, unspecified: Secondary | ICD-10-CM | POA: Insufficient documentation

## 2018-04-04 DIAGNOSIS — I252 Old myocardial infarction: Secondary | ICD-10-CM | POA: Insufficient documentation

## 2018-04-04 DIAGNOSIS — I2510 Atherosclerotic heart disease of native coronary artery without angina pectoris: Secondary | ICD-10-CM | POA: Insufficient documentation

## 2018-04-04 DIAGNOSIS — Z96652 Presence of left artificial knee joint: Secondary | ICD-10-CM | POA: Insufficient documentation

## 2018-04-04 DIAGNOSIS — R339 Retention of urine, unspecified: Secondary | ICD-10-CM

## 2018-04-04 DIAGNOSIS — Z79899 Other long term (current) drug therapy: Secondary | ICD-10-CM | POA: Diagnosis not present

## 2018-04-04 DIAGNOSIS — I110 Hypertensive heart disease with heart failure: Secondary | ICD-10-CM | POA: Diagnosis not present

## 2018-04-04 DIAGNOSIS — Z7901 Long term (current) use of anticoagulants: Secondary | ICD-10-CM | POA: Diagnosis not present

## 2018-04-04 DIAGNOSIS — Z951 Presence of aortocoronary bypass graft: Secondary | ICD-10-CM | POA: Insufficient documentation

## 2018-04-04 DIAGNOSIS — Z87440 Personal history of urinary (tract) infections: Secondary | ICD-10-CM | POA: Insufficient documentation

## 2018-04-04 DIAGNOSIS — N401 Enlarged prostate with lower urinary tract symptoms: Secondary | ICD-10-CM | POA: Insufficient documentation

## 2018-04-04 DIAGNOSIS — I509 Heart failure, unspecified: Secondary | ICD-10-CM | POA: Insufficient documentation

## 2018-04-04 LAB — URINALYSIS, ROUTINE W REFLEX MICROSCOPIC
Bacteria, UA: NONE SEEN
Bilirubin Urine: NEGATIVE
GLUCOSE, UA: NEGATIVE mg/dL
Hgb urine dipstick: NEGATIVE
KETONES UR: NEGATIVE mg/dL
Leukocytes, UA: NEGATIVE
NITRITE: POSITIVE — AB
PH: 7 (ref 5.0–8.0)
Protein, ur: NEGATIVE mg/dL
SPECIFIC GRAVITY, URINE: 1.013 (ref 1.005–1.030)

## 2018-04-04 MED ORDER — CEPHALEXIN 500 MG PO CAPS: 500.0000 mg | ORAL_CAPSULE | Freq: Four times a day (QID) | ORAL | 0 refills | Status: DC

## 2018-04-04 NOTE — ED Triage Notes (Signed)
Pt states he was treated for a UTI around 10-12 days ago. Pt states today he was only able to "dribble urine." Pt C/O pain in his lower abdomen. Pt denies N/V.

## 2018-04-04 NOTE — Discharge Instructions (Signed)
Please call Dr.Winter's office for follow up and catheter removal.

## 2018-04-05 ENCOUNTER — Telehealth: Payer: Self-pay | Admitting: Internal Medicine

## 2018-04-05 NOTE — Telephone Encounter (Signed)
Pt calling and wanting to know if it's alright for him to have his surgery tomorrow because he went to the hospital b/c of his prostate and now he has a cathter. Please call pt to discuss.

## 2018-04-05 NOTE — ED Notes (Signed)
Pt ambulatory to waiting room. Pt verbalized understanding of discharge instructions.   

## 2018-04-05 NOTE — Telephone Encounter (Signed)
Call returned to Pt.  Advised that Pt follow up with urology before we schedule for ablation.  Pt indicates understanding, has an appt next Monday with urology.  Will continue to monitor to reschedule ablation.

## 2018-04-06 ENCOUNTER — Encounter (HOSPITAL_COMMUNITY): Admission: RE | Payer: Self-pay | Source: Ambulatory Visit

## 2018-04-06 ENCOUNTER — Ambulatory Visit (HOSPITAL_COMMUNITY): Admission: RE | Admit: 2018-04-06 | Payer: Medicare HMO | Source: Ambulatory Visit | Admitting: Internal Medicine

## 2018-04-06 LAB — URINE CULTURE: Culture: NO GROWTH

## 2018-04-06 SURGERY — ATRIAL FIBRILLATION ABLATION
Anesthesia: Monitor Anesthesia Care

## 2018-04-06 NOTE — ED Provider Notes (Signed)
[facility] EMERGENCY DEPARTMENT Provider Note   CSN: [id number] Arrival date & time: 04/04/18  2013     History   Chief Complaint Chief Complaint  Patient presents with  . Urinary Retention    HPI [name] [name] is a 67 y.o. male.  HPI 67 year old man presents today complaining of inability to void since earlier this morning.  He states he was treated for urinary tract infection 10 to 12 days ago.  Symptoms from that have essentially resolved.  Today he was unable to urinate and has only been dribbling urine.  Is complaining of lower abdominal pain with feeling of need to urinate.  He has not had fever or chills.  He reports one prior episode of urinary retention that required a Foley catheter. Past Medical History:  Diagnosis Date  . Anxiety   . Coronary artery disease    2v CABG, 2004 [facility])  . DJD (degenerative joint disease)   . HLD (hyperlipidemia)   . Hypertension   . MI, old 2004  . OA (osteoarthritis)   . Obesity   . PAF (paroxysmal atrial fibrillation) (HCC)    a. started on sotalol 08/2016.  Marland Kitchen Sleep apnea    on CPAP  . Spinal stenosis   . Typical atrial flutter [facility])     Patient Active Problem List   Diagnosis Date Noted  . Unstable angina (HCC) 02/04/2018  . Typical atrial flutter (HCC)   . Spinal stenosis   . Sleep apnea   . QT prolongation   . Palpitations   . PAF (paroxysmal atrial fibrillation) (HCC)   . OA (osteoarthritis)   . Hypertension   . HLD (hyperlipidemia)   . Edema   . DJD (degenerative joint disease)   . Coronary artery disease   . Chronic chest pain   . 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 A-fib (HCC) 09/15/2016  . Chest pain 09/09/2016  . Obesity 08/22/2016  . Anxiety 08/22/2016  . Encounter for monitoring sotalol therapy 08/20/2016  . Morbidly obese (HCC) 07/12/2016  . Medical non-compliance 07/12/2016  . Elevated TSH 07/09/2016  . BPH without urinary obstruction 07/09/2016  . Need for hepatitis C screening  test 07/09/2016  . Idiopathic gout 07/09/2016  . Hyperglycemia 08/01/2015  . Atrial fibrillation with RVR (HCC) 03/28/2015  . Routine general medical examination at a health care facility 08/11/2013  . Chronic sinus bradycardia   . Obstructive sleep apnea 09/12/2010  . MYOCARDIAL INFARCTION 09/11/2010  . Spinal stenosis, unspecified region other than cervical 09/11/2010  . Hyperlipidemia with target LDL less than 70 09/08/2010  . Depression 09/08/2010  . Essential hypertension 09/08/2010  . Coronary atherosclerosis 09/08/2010  . Congestive heart failure (HCC) 09/08/2010  . GERD 09/08/2010  . MI, old 10/05/2002    Past Surgical History:  Procedure Laterality Date  . BACK SURGERY  2011  . CARPAL TUNNEL RELEASE     bilateral  . CORONARY ARTERY BYPASS GRAFT     2004  . ELECTROPHYSIOLOGIC STUDY [name] 09/15/2016   Procedure: Atrial Fibrillation Ablation;  Surgeon: Hillis Range, MD;  Location: [facility] INVASIVE CV LAB;  Service: Cardiovascular;  Laterality: [name];  . fistula surgery    . hemorrhoidectomy    . LEFT HEART CATH AND CORS/GRAFTS ANGIOGRAPHY [name] 02/07/2018   Procedure: LEFT HEART CATH AND CORS/GRAFTS ANGIOGRAPHY;  Surgeon: [name], Peter M, MD;  Location: [facility] INVASIVE CV LAB;  Service: Cardiovascular;  Laterality: [name];  . LUMBAR LAMINECTOMY    . right foot fracture    .  TEE WITHOUT CARDIOVERSION [name] 09/15/2016   Procedure: TRANSESOPHAGEAL ECHOCARDIOGRAM (TEE);  Surgeon: Lewayne Bunting, MD;  Location: [facility] ENDOSCOPY;  Service: Cardiovascular;  Laterality: [name];  . TOTAL KNEE ARTHROPLASTY Left 07/07/2017   Procedure: LEFT TOTAL KNEE ARTHROPLASTY;  Surgeon: Ranee Gosselin, MD;  Location: [facility] ORS;  Service: Orthopedics;  Laterality: Left;  . TRANSTHORACIC ECHOCARDIOGRAM  08/25/2010   EF 55-60%        Home Medications    Prior to Admission medications   Medication Sig Start Date End Date Taking? Authorizing Provider  acetaminophen (TYLENOL) 650 MG CR tablet Take 650 mg by mouth every 8  (eight) hours as needed for pain.    [provider]  amiodarone (PACERONE) 200 MG tablet Take 1 tablet (200 mg total) by mouth daily. 03/14/18   Allred, Fayrene Fearing, MD  apixaban (ELIQUIS) 5 MG TABS tablet Take 1 tablet (5 mg total) by mouth 2 (two) times daily. 11/03/17   Antoine Poche, MD  atorvastatin (LIPITOR) 20 MG tablet Take 1 tablet (20 mg total) by mouth daily. Patient taking differently: Take 20 mg by mouth daily at 6 PM.  11/03/17   Branch, Dorothe Pea, MD  cephALEXin (KEFLEX) 500 MG capsule Take 1 capsule (500 mg total) by mouth 4 (four) times daily. 04/04/18   Margarita Grizzle, MD  chlorthalidone (HYGROTON) 25 MG tablet Take 1.5 tablets (37.5 mg total) by mouth daily. 11/03/17   Antoine Poche, MD  Coenzyme Q10 (CO Q 10) 100 MG CAPS Take 100 mg by mouth daily.     [provider]  diltiazem (CARDIZEM CD) 360 MG 24 hr capsule Take 1 capsule (360 mg total) by mouth daily. 11/03/17   Antoine Poche, MD  fish oil-omega-3 fatty acids 1000 MG capsule Take 1 g by mouth 2 (two) times daily.     [provider]  furosemide (LASIX) 20 MG tablet Take 1 tablet by mouth daily for 2 days then only as needed for swelling/weight gain Patient taking differently: Take 20 mg by mouth daily as needed for fluid. only as needed for swelling/weight gain 02/17/18   Gypsy Balsam K, NP  irbesartan (AVAPRO) 300 MG tablet Take 1 tablet (300 mg total) by mouth at bedtime. 11/03/17 11/03/18  Antoine Poche, MD  metoprolol tartrate (LOPRESSOR) 25 MG tablet Take 12.5 mg by mouth 2 (two) times daily as needed (a-fib).     [provider]  Multiple Vitamin (MULTIVITAMIN) tablet Take 1 tablet by mouth daily.    [provider]  nitroGLYCERIN (NITROSTAT) 0.4 MG SL tablet Place 1 tablet (0.4 mg total) under the tongue every 5 (five) minutes x 3 doses as needed for chest pain. 02/07/18   Arty Baumgartner, NP  omeprazole (PRILOSEC) 20 MG capsule Take 1 capsule (20 mg total) by mouth  every other day. 11/03/17   Antoine Poche, MD  PRESCRIPTION MEDICATION Pt uses CPAP machine at bedtime    [provider]  sertraline (ZOLOFT) 25 MG tablet Take 1 tablet (25 mg total) by mouth daily. 11/03/17   Antoine Poche, MD    Family History Family History  Problem Relation Age of Onset  . Breast cancer Mother   . Hypertension Mother   . Coronary artery disease Father   . Stroke Father   . Hypertension Father     Social History Social History   Tobacco Use  . Smoking status: Never Smoker  . Smokeless tobacco: Never Used  Substance Use Topics  .  Alcohol use: No    Alcohol/week: 0.0 oz  . Drug use: No     Allergies   Nsaids and Vancomycin   Review of Systems Review of Systems  All other systems reviewed and are negative.    Physical Exam Updated Vital Signs BP (!) 161/74 (BP Location: Right Arm)   Pulse (!) 54   Temp 98.2 F (36.8 C) (Temporal)   Resp 20   Ht 1.676 m (5\' 6" )   Wt 111.1 kg (245 lb)   SpO2 97%   BMI 39.54 kg/m   Physical Exam  Constitutional: He is oriented to person, place, and time. He appears well-developed and well-nourished.  HENT:  Head: Normocephalic.  Eyes: Pupils are equal, round, and reactive to light.  Neck: Normal range of motion.  Cardiovascular: Normal rate and regular rhythm.  Pulmonary/Chest: Effort normal.  Abdominal: Soft. Bowel sounds are normal. He exhibits distension.  Lower abdomen distended consistent with her distention to umbilicus  Musculoskeletal: Normal range of motion.  Neurological: He is alert and oriented to person, place, and time.  Skin: Skin is warm. Capillary refill takes less than 2 seconds.  Psychiatric: His mood appears anxious.  Nursing note and vitals reviewed.    ED Treatments / Results  Labs (all labs ordered are listed, but only abnormal results are displayed) Labs Reviewed  URINALYSIS, ROUTINE W REFLEX MICROSCOPIC - Abnormal; Notable for the following components:       Result Value   Color, Urine AMBER (*)    Nitrite POSITIVE (*)    All other components within normal limits  URINE CULTURE    EKG None  Radiology No results found.  Procedures Procedures (including critical care time)  Medications Ordered in ED Medications - No data to display   Initial Impression / Assessment and Plan / ED Course  I have reviewed the triage vital signs and the nursing notes.  Pertinent labs & imaging results that were available during my care of the patient were reviewed by me and considered in my medical decision making (see chart for details).     Foley catheter placed with good urine output and reduction of symptoms.  Urinalysis obtained and patient started on Keflex.  Patient had multiple questions about catheter.  I discussed with him the need for this to stay in place for the next several days.  He has a procedure scheduled later this week.  We discussed that he can have this in place through the procedure.  He is given a referral and instructed to call the urology office tomorrow to make an appointment for after the procedure.  Final Clinical Impressions(s) / ED Diagnoses   Final diagnoses:  Urinary retention    ED Discharge Orders        Ordered    cephALEXin (KEFLEX) 500 MG capsule  4 times daily     04/04/18 2249       Margarita Grizzle, MD 04/06/18 [phone]

## 2018-04-11 DIAGNOSIS — R338 Other retention of urine: Secondary | ICD-10-CM | POA: Diagnosis not present

## 2018-04-13 ENCOUNTER — Telehealth: Payer: Self-pay | Admitting: Internal Medicine

## 2018-04-13 DIAGNOSIS — I4819 Other persistent atrial fibrillation: Secondary | ICD-10-CM

## 2018-04-13 NOTE — Telephone Encounter (Signed)
New Message:      Pt is calling in reference to scheduling his ablation

## 2018-04-13 NOTE — Telephone Encounter (Signed)
New phone note started.  See continued conversation on message dated 04/13/2018.  Closing duplicate note.

## 2018-04-13 NOTE — Telephone Encounter (Signed)
Returned call to Pt.  Pt is s/p indwelling catheter for 7 days.  Pt on medication now to relax bladder. Per Pt he has another appointment with [facility] on 04/25/2018.  After that Pt expects to be cleared by urology.  Pt wants to continue process for afib ablation. Pt states he is unsure if he could afford another cardiac CT.  Will discuss with Dr. Johney Frame.

## 2018-04-14 NOTE — Telephone Encounter (Signed)
Returned call to Pt.  Per Dr. Allred-tentatively schedule for afib ablation pending review of charts from [facility]. No repeat cardiac CT-will use existing CT.  Notified Pt-tentatively scheduled for afib ablation on May 24, 2018 at 11:30 am.  When progress note received from [facility]-will confirm with Dr. Johney Frame.  Then will schedule day to come in for labs and will create instruction letter for Pt.  Pt indicates understanding.  Thanked this nurse for the call back.

## 2018-04-25 DIAGNOSIS — R338 Other retention of urine: Secondary | ICD-10-CM | POA: Diagnosis not present

## 2018-05-04 DIAGNOSIS — R338 Other retention of urine: Secondary | ICD-10-CM | POA: Diagnosis not present

## 2018-05-10 ENCOUNTER — Telehealth: Payer: Self-pay | Admitting: Internal Medicine

## 2018-05-10 NOTE — Telephone Encounter (Signed)
New message   Patient calling to discuss instructions for ablation.

## 2018-05-10 NOTE — Telephone Encounter (Signed)
Returned call to Pt.  Pt will come to [facility] office for lab work on May 17, 2018 and will pick up instruction letter at that time.  Pt indicates understanding.  No further questions.

## 2018-05-10 NOTE — Telephone Encounter (Signed)
Duplicate.    Closing.

## 2018-05-11 ENCOUNTER — Ambulatory Visit (HOSPITAL_COMMUNITY): Payer: Medicare HMO | Admitting: Nurse Practitioner

## 2018-05-17 ENCOUNTER — Other Ambulatory Visit: Payer: Medicare HMO

## 2018-05-17 DIAGNOSIS — I481 Persistent atrial fibrillation: Secondary | ICD-10-CM | POA: Diagnosis not present

## 2018-05-17 DIAGNOSIS — I4819 Other persistent atrial fibrillation: Secondary | ICD-10-CM

## 2018-05-18 LAB — CBC WITH DIFFERENTIAL/PLATELET
BASOS ABS: 0.1 10*3/uL (ref 0.0–0.2)
Basos: 1 %
EOS (ABSOLUTE): 0.2 10*3/uL (ref 0.0–0.4)
EOS: 2 %
HEMATOCRIT: 41.8 % (ref 37.5–51.0)
Hemoglobin: 13.5 g/dL (ref 13.0–17.7)
Immature Grans (Abs): 0 10*3/uL (ref 0.0–0.1)
Immature Granulocytes: 1 %
LYMPHS ABS: 1.8 10*3/uL (ref 0.7–3.1)
Lymphs: 21 %
MCH: 28.5 pg (ref 26.6–33.0)
MCHC: 32.3 g/dL (ref 31.5–35.7)
MCV: 88 fL (ref 79–97)
MONOS ABS: 0.8 10*3/uL (ref 0.1–0.9)
Monocytes: 9 %
Neutrophils Absolute: 5.9 10*3/uL (ref 1.4–7.0)
Neutrophils: 66 %
Platelets: 302 10*3/uL (ref 150–450)
RBC: 4.73 x10E6/uL (ref 4.14–5.80)
RDW: 13.4 % (ref 12.3–15.4)
WBC: 8.7 10*3/uL (ref 3.4–10.8)

## 2018-05-18 LAB — BASIC METABOLIC PANEL
BUN/Creatinine Ratio: 14 (ref 10–24)
BUN: 19 mg/dL (ref 8–27)
CO2: 19 mmol/L — AB (ref 20–29)
Calcium: 9.6 mg/dL (ref 8.6–10.2)
Chloride: 106 mmol/L (ref 96–106)
Creatinine, Ser: 1.35 mg/dL — ABNORMAL HIGH (ref 0.76–1.27)
GFR calc Af Amer: 64 mL/min/{1.73_m2} (ref >59)
GFR, EST NON AFRICAN AMERICAN: 55 mL/min/{1.73_m2} — AB (ref >59)
Glucose: 95 mg/dL (ref 65–99)
POTASSIUM: 4.4 mmol/L (ref 3.5–5.2)
SODIUM: 143 mmol/L (ref 134–144)

## 2018-05-19 ENCOUNTER — Telehealth: Payer: Self-pay | Admitting: Internal Medicine

## 2018-05-19 ENCOUNTER — Telehealth: Payer: Self-pay

## 2018-05-19 NOTE — Telephone Encounter (Signed)
Left detailed message on Pt cell phone per DPR.  Advised procedure was outpatient.

## 2018-05-19 NOTE — Telephone Encounter (Signed)
Pt called.  Pt concerned about getting "too much fluid" after his procedure.   Pt had surgery previously and when he could not urinate he was given 2 bags of IV fluids and was sent into CHF exac.  Reassured Pt that he would be under cardiac physicians during hospitalization and they would monitor him closely.  Pt thanked nurse for call.

## 2018-05-19 NOTE — Telephone Encounter (Signed)
New message    Please notify the patient if the procedure that the patient is having 05/24/18 is inpatient or outpatient surgery. Please contact the patient with this information.

## 2018-05-23 MED ORDER — HEPARIN SODIUM (PORCINE) 1000 UNIT/ML IJ SOLN
INTRAMUSCULAR | Status: AC
  Filled 2018-05-23: qty 1

## 2018-05-23 MED ORDER — VERAPAMIL HCL 2.5 MG/ML IV SOLN
INTRAVENOUS | Status: AC
  Filled 2018-05-23: qty 2

## 2018-05-23 MED ORDER — HEPARIN (PORCINE) IN NACL 1000-0.9 UT/500ML-% IV SOLN
INTRAVENOUS | Status: AC
  Filled 2018-05-23: qty 500

## 2018-05-24 ENCOUNTER — Ambulatory Visit (HOSPITAL_COMMUNITY): Payer: Medicare HMO | Admitting: Anesthesiology

## 2018-05-24 ENCOUNTER — Ambulatory Visit (HOSPITAL_COMMUNITY)
Admission: RE | Disposition: A | Discharge status: Home / Self Care | Payer: Self-pay | Source: Ambulatory Visit | Attending: Internal Medicine

## 2018-05-24 ENCOUNTER — Other Ambulatory Visit: Payer: Self-pay

## 2018-05-24 ENCOUNTER — Ambulatory Visit (HOSPITAL_COMMUNITY)
Admission: RE | Admit: 2018-05-24 | Discharge: 2018-05-25 | Disposition: A | Discharge status: Home / Self Care | Payer: Medicare HMO | Source: Ambulatory Visit | Attending: Internal Medicine | Admitting: Internal Medicine

## 2018-05-24 DIAGNOSIS — I4891 Unspecified atrial fibrillation: Secondary | ICD-10-CM | POA: Diagnosis not present

## 2018-05-24 DIAGNOSIS — I110 Hypertensive heart disease with heart failure: Secondary | ICD-10-CM | POA: Diagnosis not present

## 2018-05-24 DIAGNOSIS — E669 Obesity, unspecified: Secondary | ICD-10-CM | POA: Diagnosis not present

## 2018-05-24 DIAGNOSIS — Z951 Presence of aortocoronary bypass graft: Secondary | ICD-10-CM | POA: Insufficient documentation

## 2018-05-24 DIAGNOSIS — I10 Essential (primary) hypertension: Secondary | ICD-10-CM | POA: Diagnosis not present

## 2018-05-24 DIAGNOSIS — I481 Persistent atrial fibrillation: Secondary | ICD-10-CM | POA: Insufficient documentation

## 2018-05-24 DIAGNOSIS — I480 Paroxysmal atrial fibrillation: Secondary | ICD-10-CM | POA: Diagnosis present

## 2018-05-24 DIAGNOSIS — G4730 Sleep apnea, unspecified: Secondary | ICD-10-CM | POA: Diagnosis not present

## 2018-05-24 DIAGNOSIS — I483 Typical atrial flutter: Secondary | ICD-10-CM | POA: Insufficient documentation

## 2018-05-24 DIAGNOSIS — E785 Hyperlipidemia, unspecified: Secondary | ICD-10-CM | POA: Diagnosis not present

## 2018-05-24 DIAGNOSIS — Z881 Allergy status to other antibiotic agents status: Secondary | ICD-10-CM | POA: Insufficient documentation

## 2018-05-24 DIAGNOSIS — M1990 Unspecified osteoarthritis, unspecified site: Secondary | ICD-10-CM | POA: Insufficient documentation

## 2018-05-24 DIAGNOSIS — Z8249 Family history of ischemic heart disease and other diseases of the circulatory system: Secondary | ICD-10-CM | POA: Diagnosis not present

## 2018-05-24 DIAGNOSIS — I252 Old myocardial infarction: Secondary | ICD-10-CM | POA: Insufficient documentation

## 2018-05-24 DIAGNOSIS — Z79899 Other long term (current) drug therapy: Secondary | ICD-10-CM | POA: Insufficient documentation

## 2018-05-24 DIAGNOSIS — Z886 Allergy status to analgesic agent status: Secondary | ICD-10-CM | POA: Diagnosis not present

## 2018-05-24 DIAGNOSIS — I509 Heart failure, unspecified: Secondary | ICD-10-CM | POA: Diagnosis not present

## 2018-05-24 DIAGNOSIS — I2510 Atherosclerotic heart disease of native coronary artery without angina pectoris: Secondary | ICD-10-CM | POA: Insufficient documentation

## 2018-05-24 DIAGNOSIS — Z9889 Other specified postprocedural states: Secondary | ICD-10-CM | POA: Diagnosis not present

## 2018-05-24 DIAGNOSIS — Z955 Presence of coronary angioplasty implant and graft: Secondary | ICD-10-CM | POA: Insufficient documentation

## 2018-05-24 DIAGNOSIS — Z823 Family history of stroke: Secondary | ICD-10-CM | POA: Insufficient documentation

## 2018-05-24 DIAGNOSIS — Z96652 Presence of left artificial knee joint: Secondary | ICD-10-CM | POA: Insufficient documentation

## 2018-05-24 DIAGNOSIS — I484 Atypical atrial flutter: Secondary | ICD-10-CM | POA: Diagnosis not present

## 2018-05-24 DIAGNOSIS — Z7901 Long term (current) use of anticoagulants: Secondary | ICD-10-CM | POA: Insufficient documentation

## 2018-05-24 HISTORY — PX: ATRIAL FIBRILLATION ABLATION: EP1191

## 2018-05-24 LAB — POCT ACTIVATED CLOTTING TIME
ACTIVATED CLOTTING TIME: 158 s
Activated Clotting Time: 224 seconds
Activated Clotting Time: 263 seconds

## 2018-05-24 SURGERY — ATRIAL FIBRILLATION ABLATION
Anesthesia: General

## 2018-05-24 MED ORDER — ROCURONIUM BROMIDE 50 MG/5ML IV SOSY
PREFILLED_SYRINGE | INTRAVENOUS | Status: DC | PRN
  Administered 2018-05-24: 10 mg via INTRAVENOUS
  Administered 2018-05-24: 70 mg via INTRAVENOUS

## 2018-05-24 MED ORDER — SERTRALINE HCL 50 MG PO TABS
25.0000 mg | ORAL_TABLET | Freq: Every day | ORAL | Status: DC
  Administered 2018-05-25: 25 mg via ORAL
  Filled 2018-05-24: qty 1

## 2018-05-24 MED ORDER — MIDAZOLAM HCL 5 MG/5ML IJ SOLN
INTRAMUSCULAR | Status: DC | PRN
  Administered 2018-05-24 (×2): 1 mg via INTRAVENOUS

## 2018-05-24 MED ORDER — SODIUM CHLORIDE 0.9 % IV SOLN
INTRAVENOUS | Status: DC | PRN
  Administered 2018-05-24 (×2): via INTRAVENOUS

## 2018-05-24 MED ORDER — HEPARIN SODIUM (PORCINE) 1000 UNIT/ML IJ SOLN
INTRAMUSCULAR | Status: DC | PRN
  Administered 2018-05-24: 5000 [IU] via INTRAVENOUS

## 2018-05-24 MED ORDER — ADENOSINE 6 MG/2ML IV SOLN
INTRAVENOUS | Status: AC
  Filled 2018-05-24: qty 2

## 2018-05-24 MED ORDER — BUPIVACAINE HCL (PF) 0.25 % IJ SOLN
INTRAMUSCULAR | Status: AC
  Filled 2018-05-24: qty 30

## 2018-05-24 MED ORDER — TAMSULOSIN HCL 0.4 MG PO CAPS
0.4000 mg | ORAL_CAPSULE | Freq: Every day | ORAL | Status: DC
  Administered 2018-05-24: 0.4 mg via ORAL
  Filled 2018-05-24: qty 1

## 2018-05-24 MED ORDER — ADENOSINE 6 MG/2ML IV SOLN
INTRAVENOUS | Status: DC | PRN
  Administered 2018-05-24 (×2): 12 mg via INTRAVENOUS

## 2018-05-24 MED ORDER — HEPARIN SODIUM (PORCINE) 1000 UNIT/ML IJ SOLN
INTRAMUSCULAR | Status: DC | PRN
  Administered 2018-05-24: 12000 [IU] via INTRAVENOUS
  Administered 2018-05-24: 1000 [IU] via INTRAVENOUS

## 2018-05-24 MED ORDER — SODIUM CHLORIDE 0.9 % IV SOLN
INTRAVENOUS | Status: DC
  Administered 2018-05-24: 09:00:00 via INTRAVENOUS

## 2018-05-24 MED ORDER — PROPOFOL 10 MG/ML IV BOLUS
INTRAVENOUS | Status: DC | PRN
  Administered 2018-05-24: 150 mg via INTRAVENOUS

## 2018-05-24 MED ORDER — HYDROCODONE-ACETAMINOPHEN 5-325 MG PO TABS: 1.0000 | ORAL_TABLET | ORAL | Status: DC | PRN

## 2018-05-24 MED ORDER — IOPAMIDOL (ISOVUE-370) INJECTION 76%
INTRAVENOUS | Status: AC
  Filled 2018-05-24: qty 50

## 2018-05-24 MED ORDER — SODIUM CHLORIDE 0.9 % IV SOLN: 250.0000 mL | INTRAVENOUS | Status: DC | PRN

## 2018-05-24 MED ORDER — SODIUM CHLORIDE 0.9 % IV SOLN
INTRAVENOUS | Status: DC | PRN
  Administered 2018-05-24: 20 ug/min via INTRAVENOUS

## 2018-05-24 MED ORDER — ACETAMINOPHEN 325 MG PO TABS
650.0000 mg | ORAL_TABLET | ORAL | Status: DC | PRN
  Administered 2018-05-24 – 2018-05-25 (×2): 650 mg via ORAL
  Filled 2018-05-24 (×2): qty 2

## 2018-05-24 MED ORDER — DEXAMETHASONE SODIUM PHOSPHATE 10 MG/ML IJ SOLN
INTRAMUSCULAR | Status: DC | PRN
  Administered 2018-05-24: 10 mg via INTRAVENOUS

## 2018-05-24 MED ORDER — ONDANSETRON HCL 4 MG/2ML IJ SOLN
INTRAMUSCULAR | Status: DC | PRN
  Administered 2018-05-24: 4 mg via INTRAVENOUS

## 2018-05-24 MED ORDER — SUGAMMADEX SODIUM 500 MG/5ML IV SOLN
INTRAVENOUS | Status: DC | PRN
  Administered 2018-05-24: 500 mg via INTRAVENOUS

## 2018-05-24 MED ORDER — PROTAMINE SULFATE 10 MG/ML IV SOLN
INTRAVENOUS | Status: DC | PRN
  Administered 2018-05-24: 40 mg via INTRAVENOUS

## 2018-05-24 MED ORDER — HEPARIN SODIUM (PORCINE) 1000 UNIT/ML IJ SOLN
INTRAMUSCULAR | Status: AC
  Filled 2018-05-24: qty 1

## 2018-05-24 MED ORDER — FUROSEMIDE 20 MG PO TABS
20.0000 mg | ORAL_TABLET | Freq: Every day | ORAL | Status: DC
  Administered 2018-05-25: 20 mg via ORAL
  Filled 2018-05-24: qty 1

## 2018-05-24 MED ORDER — LIDOCAINE 2% (20 MG/ML) 5 ML SYRINGE
INTRAMUSCULAR | Status: DC | PRN
  Administered 2018-05-24: 100 mg via INTRAVENOUS

## 2018-05-24 MED ORDER — PANTOPRAZOLE SODIUM 40 MG PO TBEC
40.0000 mg | DELAYED_RELEASE_TABLET | Freq: Every day | ORAL | Status: DC
  Administered 2018-05-25: 40 mg via ORAL
  Filled 2018-05-24: qty 1

## 2018-05-24 MED ORDER — ISOPROTERENOL HCL 0.2 MG/ML IJ SOLN
INTRAMUSCULAR | Status: AC
  Filled 2018-05-24: qty 5

## 2018-05-24 MED ORDER — ISOPROTERENOL HCL 0.2 MG/ML IJ SOLN
INTRAVENOUS | Status: DC | PRN
  Administered 2018-05-24: 10 ug/min via INTRAVENOUS

## 2018-05-24 MED ORDER — IOPAMIDOL (ISOVUE-370) INJECTION 76%
INTRAVENOUS | Status: DC | PRN
  Administered 2018-05-24: 2 mL

## 2018-05-24 MED ORDER — SODIUM CHLORIDE 0.9% FLUSH: 3.0000 mL | INTRAVENOUS | Status: DC | PRN

## 2018-05-24 MED ORDER — SODIUM CHLORIDE 0.9% FLUSH
3.0000 mL | Freq: Two times a day (BID) | INTRAVENOUS | Status: DC
  Administered 2018-05-24: 3 mL via INTRAVENOUS

## 2018-05-24 MED ORDER — APIXABAN 5 MG PO TABS
5.0000 mg | ORAL_TABLET | Freq: Two times a day (BID) | ORAL | Status: DC
  Administered 2018-05-24 – 2018-05-25 (×2): 5 mg via ORAL
  Filled 2018-05-24 (×2): qty 1

## 2018-05-24 MED ORDER — FENTANYL CITRATE (PF) 100 MCG/2ML IJ SOLN
INTRAMUSCULAR | Status: DC | PRN
  Administered 2018-05-24 (×3): 50 ug via INTRAVENOUS

## 2018-05-24 MED ORDER — IRBESARTAN 150 MG PO TABS
300.0000 mg | ORAL_TABLET | Freq: Every day | ORAL | Status: DC
  Administered 2018-05-24: 300 mg via ORAL
  Filled 2018-05-24: qty 2

## 2018-05-24 SURGICAL SUPPLY — 17 items
BLANKET WARM UNDERBOD FULL ACC (MISCELLANEOUS) ×2 IMPLANT
CATH MAPPNG PENTARAY F 2-6-2MM (CATHETERS) ×1 IMPLANT
CATH NAVISTAR SMARTTOUCH DF (ABLATOR) ×2 IMPLANT
CATH SOUNDSTAR 3D IMAGING (CATHETERS) ×2 IMPLANT
CATH WEBSTER BI DIR CS D-F CRV (CATHETERS) ×2 IMPLANT
COVER SWIFTLINK CONNECTOR (BAG) ×2 IMPLANT
NEEDLE BAYLIS TRANSSEPTAL 71CM (NEEDLE) ×2 IMPLANT
PACK EP LATEX FREE (CUSTOM PROCEDURE TRAY) ×1
PACK EP LF (CUSTOM PROCEDURE TRAY) ×1 IMPLANT
PAD PRO RADIOLUCENT 2001M-C (PAD) ×2 IMPLANT
PATCH CARTO3 (PAD) ×2 IMPLANT
PENTARAY F 2-6-2MM (CATHETERS) ×2
SHEATH AVANTI 11F 11CM (SHEATH) ×2 IMPLANT
SHEATH PINNACLE 7F 10CM (SHEATH) ×4 IMPLANT
SHEATH PINNACLE 9F 10CM (SHEATH) ×2 IMPLANT
SHEATH SWARTZ TS SL2 63CM 8.5F (SHEATH) ×2 IMPLANT
TUBING SMART ABLATE COOLFLOW (TUBING) ×2 IMPLANT

## 2018-05-24 NOTE — Discharge Instructions (Signed)
No driving for 4 days. No lifting over 5 lbs for 1 week. No sexual activity for 1 week. You may return to work in 1 week. Keep procedure site clean & dry. If you notice increased pain, swelling, bleeding or pus, call/return!  You may shower, but no soaking baths/hot tubs/pools for 1 week.  ° ° °You have an appointment set up with the At[facility] Fibrillation Clinic.  Multiple studies have shown that being followed by a dedicated atrial fibrillation clinic in addition to the standard care you receive from your other physicians improves health. We believe that enrollment in the atrial fibrillation clinic will allow us to better care for you.  ° °The phone number to the At[facility] Fibrillation Clinic is [phone]. The clinic is staffed Monday through Friday from 8:30am to 5pm. ° °Parking Directions: The clinic is located in the Heart and Vascular Building connected to [facility]. °1)From [address] turn on to [address] and go to the 3rd entrance  (Heart and Vascular entrance) on the right. °2)Look to the right for Heart &Vascular Parking Garage. °3)A code for the entrance is required please call the clinic to receive this.   °4)Take the elevators to the 1st floor. Registration is in the room with the glass walls at the end of the hallway. ° °If you have any trouble parking or locating the clinic, please don’t hesitate to call [phone]. ° ° °

## 2018-05-24 NOTE — Progress Notes (Addendum)
Site area: RFV x 3 Site Prior to Removal:  Level 0 Pressure Applied For: 30 min Manual:  yes  Patient Status During Pull:  stable Post Pull Site:  Level 0 Post Pull Instructions Given:  yes Post Pull Pulses Present: palpable Dressing Applied:  clear Bedrest begins @ 1530 till 2130 Comments: 1605 RF dressing noted to be saturated Pressure x 10 min-redressed

## 2018-05-24 NOTE — Anesthesia Preprocedure Evaluation (Addendum)
Anesthesia Evaluation  Patient identified by MRN, date of birth, ID band Patient awake    Reviewed: Allergy & Precautions, NPO status , Patient's Chart, lab work & pertinent test results  Airway Mallampati: III  TM Distance: >3 FB Neck ROM: Full    Dental  (+) Teeth Intact, Dental Advisory Given   Pulmonary sleep apnea and Continuous Positive Airway Pressure Ventilation ,    breath sounds clear to auscultation       Cardiovascular hypertension, Pt. on home beta blockers and Pt. on medications + CAD, + Past MI, + CABG (2v CABG 2004) and +CHF  + dysrhythmias (pAFib on amiodarone and eliquis)  Rhythm:Irregular Rate:Normal  TTE 03/2018 EF 60-65%, no valvular abnormalities   Neuro/Psych Anxiety Depression negative neurological ROS     GI/Hepatic Neg liver ROS, GERD  Controlled and Medicated,  Endo/Other  negative endocrine ROS  Renal/GU negative Renal ROS  negative genitourinary   Musculoskeletal  (+) Arthritis , Osteoarthritis,    Abdominal   Peds  Hematology negative hematology ROS (+)   Anesthesia Other Findings   Reproductive/Obstetrics                            Anesthesia Physical Anesthesia Plan  ASA: III  Anesthesia Plan: General   Post-op Pain Management:    Induction: Intravenous  PONV Risk Score and Plan: 2 and Dexamethasone and Ondansetron  Airway Management Planned: Oral ETT  Additional Equipment:   Intra-op Plan:   Post-operative Plan: Extubation in OR  Informed Consent: I have reviewed the patients History and Physical, chart, labs and discussed the procedure including the risks, benefits and alternatives for the proposed anesthesia with the patient or authorized representative who has indicated his/her understanding and acceptance.   Dental advisory given and Dental Advisory Given  Plan Discussed with: CRNA  Anesthesia Plan Comments:        Anesthesia Quick  Evaluation

## 2018-05-24 NOTE — Transfer of Care (Signed)
Immediate Anesthesia Transfer of Care Note  Patient: [name] [name]  Procedure(s) Performed: ATRIAL FIBRILLATION ABLATION ([name] )  Patient Location: Cath Lab  Anesthesia Type:General  Level of Consciousness: awake, alert  and oriented  Airway & Oxygen Therapy: Patient Spontanous Breathing and Patient connected to face mask oxygen  Post-op Assessment: Report given to RN, Post -op Vital signs reviewed and stable and Patient moving all extremities X 4  Post vital signs: Reviewed and stable  Last Vitals:  Vitals Value Taken Time  BP 164/60 05/24/2018  2:41 PM  Temp 36.2 C 05/24/2018  2:42 PM  Pulse 70 05/24/2018  2:42 PM  Resp 18 05/24/2018  2:42 PM  SpO2 97 % 05/24/2018  2:42 PM  Vitals shown include unvalidated device data.  Last Pain:  Vitals:   05/24/18 1442  TempSrc: Temporal  PainSc: 0-No pain         Complications: No apparent anesthesia complications

## 2018-05-24 NOTE — Anesthesia Postprocedure Evaluation (Signed)
Anesthesia Post Note  Patient: [name] [name]  Procedure(s) Performed: ATRIAL FIBRILLATION ABLATION ([name] )     Patient location during evaluation: PACU Anesthesia Type: General Level of consciousness: awake and alert Pain management: pain level controlled Vital Signs Assessment: post-procedure vital signs reviewed and stable Respiratory status: spontaneous breathing, nonlabored ventilation and respiratory function stable Cardiovascular status: blood pressure returned to baseline and stable Postop Assessment: no apparent nausea or vomiting Anesthetic complications: no    Last Vitals:  Vitals:   05/24/18 1600 05/24/18 1610  BP: (!) 162/70 (!) 160/75  Pulse: 66 65  Resp: 17 16  Temp:    SpO2: 93% 92%    Last Pain:  Vitals:   05/24/18 1528  TempSrc: Temporal  PainSc:                  [name]

## 2018-05-24 NOTE — H&P (Signed)
CC: afib   History of Present Illness: [name] [name] is a 67 y.o. male who presents today for afib ablation.   He was recently hospitalized with recurrent afib and chest pain.  Cath was reviewed.  Medical management advised.  He is doing better.  He is very anxious.  He worries about risks of amiodarone long term and wishes to stop this medicine if possible. He reports ongoing afib despite amiodarone.  Today, he denies symptoms of palpitations, chest pain, shortness of breath, orthopnea, PND, claudication, dizziness, presyncope, syncope, bleeding, or neurologic sequela. The patient is tolerating medications without difficulties and is otherwise without complaint today.        Past Medical History:  Diagnosis Date  . Anxiety   . Coronary artery disease    2v CABG, 2004 [facility])  . DJD (degenerative joint disease)   . HLD (hyperlipidemia)   . Hypertension   . MI, old 2004  . OA (osteoarthritis)   . Obesity   . PAF (paroxysmal atrial fibrillation) (HCC)    a. started on sotalol 08/2016.  Marland Kitchen Sleep apnea    on CPAP  . Spinal stenosis   . Typical atrial flutter [facility])         Past Surgical History:  Procedure Laterality Date  . BACK SURGERY  2011  . CARPAL TUNNEL RELEASE     bilateral  . CORONARY ARTERY BYPASS GRAFT     2004  . ELECTROPHYSIOLOGIC STUDY [name] 09/15/2016   Procedure: Atrial Fibrillation Ablation;  Surgeon: Hillis Range, MD;  Location: [facility] INVASIVE CV LAB;  Service: Cardiovascular;  Laterality: [name];  . fistula surgery    . hemorrhoidectomy    . LEFT HEART CATH AND CORS/GRAFTS ANGIOGRAPHY [name] 02/07/2018   Procedure: LEFT HEART CATH AND CORS/GRAFTS ANGIOGRAPHY;  Surgeon: [name], Peter M, MD;  Location: [facility] INVASIVE CV LAB;  Service: Cardiovascular;  Laterality: [name];  . LUMBAR LAMINECTOMY    . right foot fracture    . TEE WITHOUT CARDIOVERSION [name] 09/15/2016   Procedure: TRANSESOPHAGEAL ECHOCARDIOGRAM (TEE);  Surgeon: Lewayne Bunting, MD;   Location: [facility] ENDOSCOPY;  Service: Cardiovascular;  Laterality: [name];  . TOTAL KNEE ARTHROPLASTY Left 07/07/2017   Procedure: LEFT TOTAL KNEE ARTHROPLASTY;  Surgeon: Ranee Gosselin, MD;  Location: [facility] ORS;  Service: Orthopedics;  Laterality: Left;  . TRANSTHORACIC ECHOCARDIOGRAM  08/25/2010   EF 55-60%           Current Outpatient Medications  Medication Sig Dispense Refill  . acetaminophen (TYLENOL) 650 MG CR tablet Take 650 mg by mouth every 8 (eight) hours as needed for pain.    Marland Kitchen amiodarone (PACERONE) 200 MG tablet Take 200 mg by mouth daily.    Marland Kitchen apixaban (ELIQUIS) 5 MG TABS tablet Take 1 tablet (5 mg total) by mouth 2 (two) times daily. 180 tablet 1  . atorvastatin (LIPITOR) 20 MG tablet Take 1 tablet (20 mg total) by mouth daily. (Patient taking differently: Take 20 mg by mouth daily at 6 PM. ) 90 tablet 1  . chlorthalidone (HYGROTON) 25 MG tablet Take 1.5 tablets (37.5 mg total) by mouth daily. 135 tablet 1  . Coenzyme Q10 (CO Q 10) 100 MG CAPS Take 400 mg by mouth daily.     Marland Kitchen diltiazem (CARDIZEM CD) 360 MG 24 hr capsule Take 1 capsule (360 mg total) by mouth daily. 90 capsule 1  . fish oil-omega-3 fatty acids 1000 MG capsule Take 1 g by mouth 2 (two) times daily.     . irbesartan (  AVAPRO) 300 MG tablet Take 1 tablet (300 mg total) by mouth at bedtime. 90 tablet 1  . metoprolol tartrate (LOPRESSOR) 25 MG tablet Take 12.5 mg by mouth 2 (two) times daily as needed (a-fib).     . Multiple Vitamin (MULTIVITAMIN) tablet Take 1 tablet by mouth daily.    . nitroGLYCERIN (NITROSTAT) 0.4 MG SL tablet Place 1 tablet (0.4 mg total) under the tongue every 5 (five) minutes x 3 doses as needed for chest pain. 25 tablet 1  . omeprazole (PRILOSEC) 20 MG capsule Take 1 capsule (20 mg total) by mouth every other day. 90 capsule 1  . PRESCRIPTION MEDICATION Pt uses CPAP machine at bedtime    . sertraline (ZOLOFT) 25 MG tablet Take 1 tablet (25 mg total) by mouth daily. 90 tablet 3  .  furosemide (LASIX) 20 MG tablet Take 1 tablet by mouth daily for 2 days then only as needed for swelling/weight gain (Patient not taking: Reported on 03/14/2018) 10 tablet 0   No current facility-administered medications for this encounter.     Allergies:   Nsaids and Vancomycin   Social History:  The patient  reports that he has never smoked. He has never used smokeless tobacco. He reports that he does not drink alcohol or use drugs.   Family History:  The patient's  family history includes Breast cancer in his mother; Coronary artery disease in his father; Hypertension in his father and mother; Stroke in his father.    ROS:  Please see the history of present illness.   All other systems are personally reviewed and negative.   PHYSICAL EXAM: Vitals:   05/24/18 0837  BP: (!) 152/68  Pulse: 65  Resp: 18  Temp: 97.7 F (36.5 C)  SpO2: 95%   GEN: Well nourished, well developed, in no acute distress  HEENT: normal  Neck: no JVD, carotid bruits, or masses Cardiac: RRR; no murmurs, rubs, or gallops,no edema  Respiratory:  clear to auscultation bilaterally, normal work of breathing GI: soft, nontender, nondistended, + BS MS: no deformity or atrophy  Skin: warm and dry  Neuro:  Strength and sensation are intact Psych: euthymic mood, full affect    Recent Labs: 06/30/2017: ALT 22 02/04/2018: B Natriuretic Peptide 149.8 02/05/2018: BUN 17; Creatinine, Ser 1.16; Hemoglobin 15.4; Platelets 307; Potassium 3.6; Sodium 137  personally reviewed   Lipid Panel  Labs(Brief)          Component Value Date/Time   CHOL 157 02/05/2018 0227   TRIG 131 02/05/2018 0227   HDL 29 (L) 02/05/2018 0227   CHOLHDL 5.4 02/05/2018 0227   VLDL 26 02/05/2018 0227   LDLCALC 102 (H) 02/05/2018 0227     personally reviewed      Wt Readings from Last 3 Encounters:  03/14/18 251 lb (113.9 kg)  02/14/18 252 lb 3.2 oz (114.4 kg)  02/07/18 248 lb 12.8 oz (112.9 kg)    ASSESSMENT AND  PLAN:  1.  Persistent afib Doing reasonably well Reports compliance with eliquis without interruption.  Cardiac CT reviewed with patient.  Therapeutic strategies for afib including medicine and ablation were discussed in detail with the patient today. Risk, benefits, and alternatives to EP study and radiofrequency ablation for afib were also discussed in detail today. These risks include but are not limited to stroke, bleeding, vascular damage, tamponade, perforation, damage to the esophagus, lungs, and other structures, pulmonary vein stenosis, worsening renal function, and death. The patient understands these risk and wishes to proceed at  this time.    Hillis Range MD, [facility] 05/24/2018 11:44 AM

## 2018-05-24 NOTE — Anesthesia Procedure Notes (Signed)
Procedure Name: Intubation Date/Time: 05/24/2018 12:18 PM Performed by: Carmela Rima, CRNA Pre-anesthesia Checklist: Timeout performed, Patient being monitored, Suction available, Emergency Drugs available and Patient identified Patient Re-evaluated:Patient Re-evaluated prior to induction Oxygen Delivery Method: Circle system utilized Preoxygenation: Pre-oxygenation with 100% oxygen Induction Type: IV induction Ventilation: Mask ventilation without difficulty Laryngoscope Size: 4 and McGraph Grade View: Grade I Tube type: Oral Tube size: 7.5 mm Number of attempts: 1 Placement Confirmation: breath sounds checked- equal and bilateral,  positive ETCO2 and ETT inserted through vocal cords under direct vision Secured at: 22 cm Tube secured with: Tape Dental Injury: Teeth and Oropharynx as per pre-operative assessment

## 2018-05-25 ENCOUNTER — Other Ambulatory Visit: Payer: Self-pay | Admitting: Cardiology

## 2018-05-25 ENCOUNTER — Encounter (HOSPITAL_COMMUNITY): Payer: Self-pay | Admitting: *Deleted

## 2018-05-25 DIAGNOSIS — I483 Typical atrial flutter: Secondary | ICD-10-CM | POA: Diagnosis not present

## 2018-05-25 DIAGNOSIS — I252 Old myocardial infarction: Secondary | ICD-10-CM | POA: Diagnosis not present

## 2018-05-25 DIAGNOSIS — G4730 Sleep apnea, unspecified: Secondary | ICD-10-CM | POA: Diagnosis not present

## 2018-05-25 DIAGNOSIS — I2510 Atherosclerotic heart disease of native coronary artery without angina pectoris: Secondary | ICD-10-CM | POA: Diagnosis not present

## 2018-05-25 DIAGNOSIS — I480 Paroxysmal atrial fibrillation: Secondary | ICD-10-CM | POA: Diagnosis not present

## 2018-05-25 DIAGNOSIS — I481 Persistent atrial fibrillation: Secondary | ICD-10-CM | POA: Diagnosis not present

## 2018-05-25 DIAGNOSIS — M1990 Unspecified osteoarthritis, unspecified site: Secondary | ICD-10-CM | POA: Diagnosis not present

## 2018-05-25 DIAGNOSIS — E669 Obesity, unspecified: Secondary | ICD-10-CM | POA: Diagnosis not present

## 2018-05-25 DIAGNOSIS — E785 Hyperlipidemia, unspecified: Secondary | ICD-10-CM | POA: Diagnosis not present

## 2018-05-25 DIAGNOSIS — I10 Essential (primary) hypertension: Secondary | ICD-10-CM | POA: Diagnosis not present

## 2018-05-25 MED ORDER — OMEPRAZOLE 20 MG PO CPDR: 20.0000 mg | DELAYED_RELEASE_CAPSULE | Freq: Every day | ORAL | 1 refills | Status: DC

## 2018-05-25 MED ORDER — FUROSEMIDE 20 MG PO TABS: ORAL_TABLET | ORAL | 0 refills | Status: DC

## 2018-05-25 MED FILL — Bupivacaine HCl Preservative Free (PF) Inj 0.25%: INTRAMUSCULAR | Qty: 30 | Status: AC

## 2018-05-25 NOTE — Discharge Summary (Addendum)
ELECTROPHYSIOLOGY PROCEDURE DISCHARGE SUMMARY    Patient ID: [name] [name],  MRN: [id number], DOB/AGE: Oct 23, 1950 67 y.o.  Admit date: 05/24/2018 Discharge date: 05/25/2018  Primary Care Physician: Patient, No Pcp Per Electrophysiologist: Hillis Range, MD  Primary Discharge Diagnosis:  Persistent atrial fibrillation status post ablation this admission  Secondary Discharge Diagnosis:  1.  CAD s/p CABG 2.  OSA on CPAP 3.  Hypertension 4.  Hyperlipidemia  Procedures This Admission:  1.  Electrophysiology study and radiofrequency catheter ablation on 05/24/18 by Dr Hillis Range.  This study demonstrated sinus rhythm upon presentation; intracardiac echo reveals a moderate sized left atrium with four separate pulmonary veins without evidence of pulmonary vein stenosis.  There was no atrial appendage thrombus; the left superior and left inferior pulmonary veins were quiescent from the prior ablation.  There was minimal return of electrical activity within the right inferior pulmonary veins at baseline.  The right superior pulmonary vein had exit conduction but not entrance conduction; successful electrical re-isolation of the right inferior and superior pulmonary veins; left atrial flutter induced and ablated long the roof of the left atrium; additional ablation along the floor of the left atrium in order to create a standard "box" lesion along the posterior wall due to persistence of atrial arrhythmias; cavo-tricuspid isthmus ablation was performed with complete bidirectional isthmus block achieved; no inducible arrhythmias following ablation both on and off of Isuprel; no early apparent complications.    Brief HPI: [name] [name] is a 67 y.o. male with a history of persistent atrial fibrillation. He underwent prior PVI in 2017 and did well initially.  He developed recurrent AF.  They have failed medical therapy with amiodarone. Risks, benefits, and alternatives to catheter ablation of atrial  fibrillation were reviewed with the patient who wished to proceed.      Hospital Course:  The patient was admitted and underwent EPS/RFCA of atrial fibrillation with details as outlined above.  They were monitored on telemetry overnight which demonstrated sinus rhythm.  Groin was without complication on the day of discharge.  The patient was examined and considered to be stable for discharge.  Wound care and restrictions were reviewed with the patient.  The patient will be seen back by Rudi Coco, NP in 4 weeks and Dr Johney Frame in 12 weeks for post ablation follow up.   This patients CHA2DS2-VASc Score and unadjusted Ischemic Stroke Rate (% per year) is equal to 2.2 % stroke rate/year from a score of 2 Above score calculated as 1 point each if present [CHF, HTN, DM, Vascular=MI/PAD/Aortic Plaque, Age if 65-74, or Male] Above score calculated as 2 points each if present [Age > 75, or Stroke/TIA/TE]   Physical Exam: Vitals:   05/24/18 1724 05/24/18 1739 05/24/18 1926 05/25/18 0359  BP: (!) 150/78 (!) 149/71 138/71 137/71  Pulse:   72 72  Resp:   20 19  Temp:   97.9 F (36.6 C) 98 F (36.7 C)  TempSrc:   Oral Oral  SpO2: 93% 94% 96% 94%  Weight:    114.4 kg  Height:        GEN- The patient is well appearing, alert and oriented x 3 today.   HEENT: normocephalic, atraumatic; sclera clear, conjunctiva pink; hearing intact; oropharynx clear; neck supple  Lungs- Clear to ausculation bilaterally, normal work of breathing.  No wheezes, rales, rhonchi Heart- Regular rate and rhythm  GI- soft, non-tender, non-distended, bowel sounds present  Extremities- no clubbing, cyanosis, or edema; DP/PT/radial pulses 2+  bilaterally, groin without hematoma/bruit MS- no significant deformity or atrophy Skin- warm and dry, no rash or lesion Psych- euthymic mood, full affect Neuro- strength and sensation are intact   Labs:   Lab Results  Component Value Date   WBC 8.7 05/17/2018   HGB 13.5  05/17/2018   HCT 41.8 05/17/2018   MCV 88 05/17/2018   PLT 302 05/17/2018   No results for input(s): NA, K, CL, CO2, BUN, CREATININE, CALCIUM, PROT, BILITOT, ALKPHOS, ALT, AST, GLUCOSE in the last 168 hours.  Invalid input(s): LABALBU   Discharge Medications:  Allergies as of 05/25/2018      Reactions   Nsaids Other (See Comments)   On blood thinner   Vancomycin Other (See Comments)   Was informed after heart surgery ? Breathing problem/patient unsure      Medication List    STOP taking these medications   cephALEXin 500 MG capsule Commonly known as:  KEFLEX     TAKE these medications   acetaminophen 650 MG CR tablet Commonly known as:  TYLENOL Take 650 mg by mouth every 8 (eight) hours as needed for pain.   amiodarone 200 MG tablet Commonly known as:  PACERONE Take 1 tablet (200 mg total) by mouth daily. What changed:  how much to take   apixaban 5 MG Tabs tablet Commonly known as:  ELIQUIS Take 1 tablet (5 mg total) by mouth 2 (two) times daily.   atorvastatin 20 MG tablet Commonly known as:  LIPITOR Take 1 tablet (20 mg total) by mouth daily.   chlorthalidone 25 MG tablet Commonly known as:  HYGROTON Take 1.5 tablets (37.5 mg total) by mouth daily.   Co Q 10 100 MG Caps Take 400 mg by mouth daily.   diltiazem 360 MG 24 hr capsule Commonly known as:  CARDIZEM CD Take 1 capsule (360 mg total) by mouth daily.   fish oil-omega-3 fatty acids 1000 MG capsule Take 1 g by mouth 2 (two) times daily.   furosemide 20 MG tablet Commonly known as:  LASIX Take 1 tablet by mouth daily for 3 days then only as needed for swelling/weight gain What changed:  additional instructions   irbesartan 300 MG tablet Commonly known as:  AVAPRO Take 1 tablet (300 mg total) by mouth at bedtime.   lactobacillus acidophilus Tabs tablet Take 1 tablet by mouth daily.   metoprolol tartrate 25 MG tablet Commonly known as:  LOPRESSOR Take 25 mg by mouth every 8 (eight) hours as  needed (a-fib).   multivitamin tablet Take 1 tablet by mouth daily.   nitroGLYCERIN 0.4 MG SL tablet Commonly known as:  NITROSTAT Place 1 tablet (0.4 mg total) under the tongue every 5 (five) minutes x 3 doses as needed for chest pain.   omeprazole 20 MG capsule Commonly known as:  PRILOSEC Take 1 capsule (20 mg total) by mouth daily. Take daily for 6 weeks then can reduce back to every other day What changed:    when to take this  additional instructions   PRESCRIPTION MEDICATION Pt uses CPAP machine at bedtime   sertraline 25 MG tablet Commonly known as:  ZOLOFT Take 1 tablet (25 mg total) by mouth daily.   tamsulosin 0.4 MG Caps capsule Commonly known as:  FLOMAX Take 0.4 mg by mouth daily.       Disposition:  Discharge Instructions    Diet - low sodium heart healthy   Complete by:  As directed    Increase activity slowly  Complete by:  As directed      Follow-up Information    [facility] Follow up on 06/21/2018.   Specialty:  Cardiology Why:  at [facility] information: [address] [id number] [place] [address] [phone]       Hillis Range, MD Follow up on 08/24/2018.   Specialty:  Cardiology Why:  at 9:30AM Contact information: [address] [place] [address] [phone]           Duration of Discharge Encounter: Greater than 30 minutes including physician time.  Signed, Gypsy Balsam, NP 05/25/2018 7:25 AM  I have seen, examined the patient, and reviewed the above assessment and plan.  Changes to above are made where necessary.  On exam, RRR.  Doing well s/p ablation DC to home with close outpatient follow-up  Co Sign: Hillis Range, MD 05/25/2018 8:40 AM

## 2018-05-25 NOTE — Plan of Care (Signed)
  Problem: Activity: Goal: Ability to return to baseline activity level will improve Outcome: Progressing Note:  Ambulates to bathroom with standby assist without difficulty.   Problem: Cardiac: Goal: Vascular access site(s) Level 0-1 will be maintained Outcome: Progressing Note:  Right groin level 0.

## 2018-06-02 ENCOUNTER — Telehealth: Payer: Self-pay | Admitting: *Deleted

## 2018-06-02 MED ORDER — APIXABAN 5 MG PO TABS: 5.0000 mg | ORAL_TABLET | Freq: Two times a day (BID) | ORAL | 0 refills | Status: DC

## 2018-06-02 NOTE — Telephone Encounter (Signed)
Pt will come by office later today to pick up Eliquis samples

## 2018-06-16 ENCOUNTER — Other Ambulatory Visit: Payer: Self-pay | Admitting: Nurse Practitioner

## 2018-06-16 NOTE — Telephone Encounter (Signed)
This is a [facility] pt °

## 2018-06-21 ENCOUNTER — Ambulatory Visit (HOSPITAL_COMMUNITY): Payer: Medicare HMO | Admitting: Nurse Practitioner

## 2018-06-27 ENCOUNTER — Ambulatory Visit: Payer: Medicare HMO | Admitting: Cardiology

## 2018-06-28 ENCOUNTER — Other Ambulatory Visit (HOSPITAL_COMMUNITY): Payer: Self-pay | Admitting: Internal Medicine

## 2018-06-29 NOTE — Telephone Encounter (Signed)
This is a A-Fib clinic pt 

## 2018-07-01 ENCOUNTER — Other Ambulatory Visit: Payer: Self-pay | Admitting: Cardiology

## 2018-07-01 MED ORDER — FUROSEMIDE 20 MG PO TABS: 20.0000 mg | ORAL_TABLET | Freq: Every day | ORAL | 1 refills | Status: DC | PRN

## 2018-07-01 NOTE — Addendum Note (Signed)
Addended by: Eustace Moore on: 07/01/2018 03:10 PM   Modules accepted: Orders

## 2018-07-04 ENCOUNTER — Encounter: Payer: Self-pay | Admitting: Primary Care

## 2018-07-04 ENCOUNTER — Ambulatory Visit (HOSPITAL_COMMUNITY)
Admission: RE | Admit: 2018-07-04 | Discharge: 2018-07-04 | Disposition: A | Discharge status: Home / Self Care | Payer: Medicare HMO | Source: Ambulatory Visit | Attending: Nurse Practitioner | Admitting: Nurse Practitioner

## 2018-07-04 ENCOUNTER — Ambulatory Visit: Payer: Medicare HMO | Admitting: Primary Care

## 2018-07-04 ENCOUNTER — Encounter (HOSPITAL_COMMUNITY): Payer: Self-pay | Admitting: Nurse Practitioner

## 2018-07-04 VITALS — BP 144/75 | HR 72 | Ht 66.0 in | Wt 251.8 lb

## 2018-07-04 VITALS — BP 144/68 | HR 63 | Ht 67.0 in | Wt 251.8 lb

## 2018-07-04 DIAGNOSIS — E785 Hyperlipidemia, unspecified: Secondary | ICD-10-CM | POA: Diagnosis not present

## 2018-07-04 DIAGNOSIS — Z951 Presence of aortocoronary bypass graft: Secondary | ICD-10-CM | POA: Insufficient documentation

## 2018-07-04 DIAGNOSIS — I481 Persistent atrial fibrillation: Secondary | ICD-10-CM

## 2018-07-04 DIAGNOSIS — Z7901 Long term (current) use of anticoagulants: Secondary | ICD-10-CM | POA: Diagnosis not present

## 2018-07-04 DIAGNOSIS — F419 Anxiety disorder, unspecified: Secondary | ICD-10-CM | POA: Diagnosis not present

## 2018-07-04 DIAGNOSIS — I2510 Atherosclerotic heart disease of native coronary artery without angina pectoris: Secondary | ICD-10-CM | POA: Insufficient documentation

## 2018-07-04 DIAGNOSIS — I4892 Unspecified atrial flutter: Secondary | ICD-10-CM | POA: Diagnosis not present

## 2018-07-04 DIAGNOSIS — Z79899 Other long term (current) drug therapy: Secondary | ICD-10-CM | POA: Insufficient documentation

## 2018-07-04 DIAGNOSIS — I252 Old myocardial infarction: Secondary | ICD-10-CM | POA: Diagnosis not present

## 2018-07-04 DIAGNOSIS — G4733 Obstructive sleep apnea (adult) (pediatric): Secondary | ICD-10-CM | POA: Diagnosis not present

## 2018-07-04 DIAGNOSIS — M1990 Unspecified osteoarthritis, unspecified site: Secondary | ICD-10-CM | POA: Diagnosis not present

## 2018-07-04 DIAGNOSIS — Z96652 Presence of left artificial knee joint: Secondary | ICD-10-CM | POA: Insufficient documentation

## 2018-07-04 DIAGNOSIS — I480 Paroxysmal atrial fibrillation: Secondary | ICD-10-CM | POA: Diagnosis not present

## 2018-07-04 DIAGNOSIS — I10 Essential (primary) hypertension: Secondary | ICD-10-CM | POA: Insufficient documentation

## 2018-07-04 DIAGNOSIS — Z8249 Family history of ischemic heart disease and other diseases of the circulatory system: Secondary | ICD-10-CM | POA: Insufficient documentation

## 2018-07-04 DIAGNOSIS — I4819 Other persistent atrial fibrillation: Secondary | ICD-10-CM

## 2018-07-04 DIAGNOSIS — E669 Obesity, unspecified: Secondary | ICD-10-CM | POA: Insufficient documentation

## 2018-07-04 DIAGNOSIS — R69 Illness, unspecified: Secondary | ICD-10-CM | POA: Diagnosis not present

## 2018-07-04 LAB — COMPREHENSIVE METABOLIC PANEL
ALK PHOS: 75 U/L (ref 38–126)
ALT: 26 U/L (ref 0–44)
AST: 25 U/L (ref 15–41)
Albumin: 3.7 g/dL (ref 3.5–5.0)
Anion gap: 10 (ref 5–15)
BILIRUBIN TOTAL: 0.7 mg/dL (ref 0.3–1.2)
BUN: 18 mg/dL (ref 8–23)
CALCIUM: 10.1 mg/dL (ref 8.9–10.3)
CO2: 28 mmol/L (ref 22–32)
CREATININE: 1.38 mg/dL — AB (ref 0.61–1.24)
Chloride: 104 mmol/L (ref 98–111)
GFR calc Af Amer: >60 mL/min (ref >60)
GFR calc non Af Amer: 53 mL/min — ABNORMAL LOW (ref >60)
Glucose, Bld: 115 mg/dL — ABNORMAL HIGH (ref 70–99)
Potassium: 4.6 mmol/L (ref 3.5–5.1)
Sodium: 142 mmol/L (ref 135–145)
TOTAL PROTEIN: 7.5 g/dL (ref 6.5–8.1)

## 2018-07-04 LAB — TSH: TSH: 5.468 u[IU]/mL — ABNORMAL HIGH (ref 0.350–4.500)

## 2018-07-04 NOTE — Patient Instructions (Signed)
Referral to DME company for continue CPAP at 13cm H20 with new mask and supplies  FU in 1 year with Dr. Vassie Loll

## 2018-07-04 NOTE — Progress Notes (Signed)
@Patient  ID: [name] [name], male    DOB: [birth year], 67 y.o.   MRN: [id number]  Chief Complaint  Patient presents with  . Follow-up    CPAP supplies    Referring provider: No ref. provider found  HPI: 67 year old male, never smoked. PMH OSA, afib. Patient of Dr. Vassie Loll, last seen 08/2017. Patient has been wearing CPAP since December 2011. Received new machine in 2017.   07/04/2018 Patient presents today for routine follow-up for sleep apnea. Patient reports 100% compliance with cpap use. Pressure set at 13cm H20. Reports benefit in apneas and daytime fatigue from use, states that he can't do without it. Needs new prescription for continued CPAP use  and renew supplies. [facility]ses [facility].   Airview Download: Usage- 90/90 days, 89 >4hrs (99%) Average usage 8 hours  Pressure 13cm H2O AHI 3.1    Allergies  Allergen Reactions  . Nsaids Other (See Comments)    On blood thinner  . Vancomycin Other (See Comments)    Was informed after heart surgery ? Breathing problem/patient unsure    Immunization History  Administered Date(s) Administered  . Influenza Whole 07/05/2010, 07/14/2012  . Influenza, Seasonal, Injecte, Preservative Fre 08/11/2013  . Influenza,inj,Quad PF,6+ Mos 06/15/2014, 07/15/2015, 07/09/2016, 06/22/2017  . Pneumococcal Polysaccharide-23 08/26/2010  . Td 10/05/1993, 09/08/2010    Past Medical History:  Diagnosis Date  . Anxiety   . Coronary artery disease    2v CABG, 2004 [facility])  . DJD (degenerative joint disease)   . HLD (hyperlipidemia)   . Hypertension   . MI, old 2004  . OA (osteoarthritis)   . Obesity   . PAF (paroxysmal atrial fibrillation) (HCC)    a. started on sotalol 08/2016.  Marland Kitchen Sleep apnea    on CPAP  . Spinal stenosis   . Typical atrial flutter (HCC)     Tobacco History: Social History   Tobacco Use  Smoking Status Never Smoker  Smokeless Tobacco Never Used   Counseling given: Not  Answered   Outpatient Medications Prior to Visit  Medication Sig Dispense Refill  . acetaminophen (TYLENOL) 650 MG CR tablet Take 650 mg by mouth every 8 (eight) hours as needed for pain.    Marland Kitchen amiodarone (PACERONE) 200 MG tablet TAKE 1 TABLET BY MOUTH EVERY DAY 90 tablet 1  . apixaban (ELIQUIS) 5 MG TABS tablet Take 1 tablet (5 mg total) by mouth 2 (two) times daily. 56 tablet 0  . atorvastatin (LIPITOR) 20 MG tablet Take 1 tablet (20 mg total) by mouth daily. 90 tablet 1  . chlorthalidone (HYGROTON) 25 MG tablet Take 1.5 tablets (37.5 mg total) by mouth daily. 135 tablet 1  . Coenzyme Q10 (CO Q 10) 100 MG CAPS Take 400 mg by mouth daily.     Marland Kitchen diltiazem (CARDIZEM CD) 360 MG 24 hr capsule TAKE 1 CAPSULE (360 MG TOTAL) BY MOUTH DAILY. 90 capsule 1  . fish oil-omega-3 fatty acids 1000 MG capsule Take 1 g by mouth 2 (two) times daily.     . furosemide (LASIX) 20 MG tablet Take 1 tablet (20 mg total) by mouth daily as needed (for swelling/weight gain). 90 tablet 1  . irbesartan (AVAPRO) 300 MG tablet Take 1 tablet (300 mg total) by mouth at bedtime. 90 tablet 1  . lactobacillus acidophilus (BACID) TABS tablet Take 1 tablet by mouth daily.    . metoprolol tartrate (LOPRESSOR) 25 MG tablet Take 25 mg by mouth every 8 (eight)  hours as needed (a-fib).     . Multiple Vitamin (MULTIVITAMIN) tablet Take 1 tablet by mouth daily.    . nitroGLYCERIN (NITROSTAT) 0.4 MG SL tablet Place 1 tablet (0.4 mg total) under the tongue every 5 (five) minutes x 3 doses as needed for chest pain. 25 tablet 1  . omeprazole (PRILOSEC) 20 MG capsule Take 1 capsule (20 mg total) by mouth daily. Take daily for 6 weeks then can reduce back to every other day 90 capsule 1  . PRESCRIPTION MEDICATION Pt uses CPAP machine at bedtime    . sertraline (ZOLOFT) 25 MG tablet Take 1 tablet (25 mg total) by mouth daily. 90 tablet 3  . tamsulosin (FLOMAX) 0.4 MG CAPS capsule Take 0.4 mg by mouth daily.  11   No facility-administered  medications prior to visit.     Review of Systems  Review of Systems  Constitutional: Negative.   HENT: Negative.   Respiratory: Negative.   Cardiovascular: Negative.   Neurological: Negative for headaches.  Psychiatric/Behavioral: Negative.  Negative for sleep disturbance.    Physical Exam  BP (!) 144/75 (BP Location: Left Arm, Cuff Size: Normal)   Pulse 72   Ht 5\' 6"  (1.676 m)   Wt 251 lb 12.8 oz (114.2 kg)   SpO2 97%   BMI 40.64 kg/m  Physical Exam  Constitutional: He is oriented to person, place, and time. He appears well-developed and well-nourished. No distress.  Overweight male, no acute distress  HENT:  Head: Normocephalic and atraumatic.  Mallampati class II-III  Eyes: Pupils are equal, round, and reactive to light. EOM are normal.  Neck: Normal range of motion. Neck supple.  Cardiovascular: Normal rate and regular rhythm.  Trace leg edema   Pulmonary/Chest: Effort normal and breath sounds normal. He has no wheezes.  Musculoskeletal: Normal range of motion.  Neurological: He is alert and oriented to person, place, and time.  Skin: Skin is warm and dry.  Psychiatric: He has a normal mood and affect. His behavior is normal. Judgment and thought content normal.     Lab Results:  CBC    Component Value Date/Time   WBC 8.7 05/17/2018 1110   WBC 11.7 (H) 02/05/2018 0948   RBC 4.73 05/17/2018 1110   RBC 5.26 02/05/2018 0948   HGB 13.5 05/17/2018 1110   HCT 41.8 05/17/2018 1110   PLT 302 05/17/2018 1110   MCV 88 05/17/2018 1110   MCH 28.5 05/17/2018 1110   MCH 29.3 02/05/2018 0948   MCHC 32.3 05/17/2018 1110   MCHC 34.4 02/05/2018 0948   RDW 13.4 05/17/2018 1110   LYMPHSABS 1.8 05/17/2018 1110   MONOABS 1.1 (H) 06/30/2017 1000   EOSABS 0.2 05/17/2018 1110   BASOSABS 0.1 05/17/2018 1110    BMET    Component Value Date/Time   NA 142 07/04/2018 0934   NA 143 05/17/2018 1110   K 4.6 07/04/2018 0934   CL 104 07/04/2018 0934   CO2 28 07/04/2018 0934     GLUCOSE 115 (H) 07/04/2018 0934   BUN 18 07/04/2018 0934   BUN 19 05/17/2018 1110   CREATININE 1.38 (H) 07/04/2018 0934   CREATININE 1.02 09/01/2016 1122   CALCIUM 10.1 07/04/2018 0934   GFRNONAA 53 (L) 07/04/2018 0934   GFRAA >60 07/04/2018 0934    BNP    Component Value Date/Time   BNP 149.8 (H) 02/04/2018 1512    ProBNP    Component Value Date/Time   PROBNP 310.0 (H) 08/23/2010 0915    Imaging:  No results found.   Assessment & Plan:   Obstructive sleep apnea 100% compliance with CPAP Pressure set at 13cm; AHI 3.1  Needs new prescription for supplies FU in 1 year   Encourage weight loss, advised to continue to wear CPAP every night for 4-6 hours or more. Do not drive if experiencing excessive daytime fatigue or somnolence    Glenford Bayley, NP 07/04/2018

## 2018-07-04 NOTE — Progress Notes (Signed)
Electrophysiology Office Note   Date:  07/04/2018   ID:  [name] [name], DOB 1951-03-30, MRN [id number]  PCP:  Patient, No Pcp Per    Primary Electrophysiologist: Rudi Coco, NP    CC: afib   History of Present Illness: [name] [name] is a 67 y.o. male who presents today for afib clinic follow-up.   He had afib ablation one month ago. He only has had one afib episode since then because he was very worried about a situation and took extra rate control and the episode was over in 20-30 mins. He denies any swallowing or groin issues.  Today, he denies symptoms of palpitations, chest pain, shortness of breath, orthopnea, PND, claudication, dizziness, presyncope, syncope, bleeding, or neurologic sequela. The patient is tolerating medications without difficulties and is otherwise without complaint today.    Past Medical History:  Diagnosis Date  . Anxiety   . Coronary artery disease    2v CABG, 2004 [facility])  . DJD (degenerative joint disease)   . HLD (hyperlipidemia)   . Hypertension   . MI, old 2004  . OA (osteoarthritis)   . Obesity   . PAF (paroxysmal atrial fibrillation) (HCC)    a. started on sotalol 08/2016.  Marland Kitchen Sleep apnea    on CPAP  . Spinal stenosis   . Typical atrial flutter [facility])    Past Surgical History:  Procedure Laterality Date  . ATRIAL FIBRILLATION ABLATION [name] 05/24/2018   Procedure: ATRIAL FIBRILLATION ABLATION;  Surgeon: Hillis Range, MD;  Location: [facility] INVASIVE CV LAB;  Service: Cardiovascular;  Laterality: [name];  . BACK SURGERY  2011  . CARPAL TUNNEL RELEASE     bilateral  . CORONARY ARTERY BYPASS GRAFT     2004  . ELECTROPHYSIOLOGIC STUDY [name] 09/15/2016   Procedure: Atrial Fibrillation Ablation;  Surgeon: Hillis Range, MD;  Location: [facility] INVASIVE CV LAB;  Service: Cardiovascular;  Laterality: [name];  . fistula surgery    . hemorrhoidectomy    . LEFT HEART CATH AND CORS/GRAFTS ANGIOGRAPHY [name] 02/07/2018   Procedure: LEFT HEART CATH AND CORS/GRAFTS  ANGIOGRAPHY;  Surgeon: [name], Peter M, MD;  Location: [facility] INVASIVE CV LAB;  Service: Cardiovascular;  Laterality: [name];  . LUMBAR LAMINECTOMY    . right foot fracture    . TEE WITHOUT CARDIOVERSION [name] 09/15/2016   Procedure: TRANSESOPHAGEAL ECHOCARDIOGRAM (TEE);  Surgeon: Lewayne Bunting, MD;  Location: [facility] ENDOSCOPY;  Service: Cardiovascular;  Laterality: [name];  . TOTAL KNEE ARTHROPLASTY Left 07/07/2017   Procedure: LEFT TOTAL KNEE ARTHROPLASTY;  Surgeon: Ranee Gosselin, MD;  Location: [facility] ORS;  Service: Orthopedics;  Laterality: Left;  . TRANSTHORACIC ECHOCARDIOGRAM  08/25/2010   EF 55-60%     Current Outpatient Medications  Medication Sig Dispense Refill  . acetaminophen (TYLENOL) 650 MG CR tablet Take 650 mg by mouth every 8 (eight) hours as needed for pain.    Marland Kitchen amiodarone (PACERONE) 200 MG tablet TAKE 1 TABLET BY MOUTH EVERY DAY 90 tablet 1  . apixaban (ELIQUIS) 5 MG TABS tablet Take 1 tablet (5 mg total) by mouth 2 (two) times daily. 56 tablet 0  . atorvastatin (LIPITOR) 20 MG tablet Take 1 tablet (20 mg total) by mouth daily. 90 tablet 1  . chlorthalidone (HYGROTON) 25 MG tablet Take 1.5 tablets (37.5 mg total) by mouth daily. 135 tablet 1  . Coenzyme Q10 (CO Q 10) 100 MG CAPS Take 400 mg by mouth daily.     Marland Kitchen diltiazem (CARDIZEM CD) 360 MG 24 hr capsule  TAKE 1 CAPSULE (360 MG TOTAL) BY MOUTH DAILY. 90 capsule 1  . fish oil-omega-3 fatty acids 1000 MG capsule Take 1 g by mouth 2 (two) times daily.     . furosemide (LASIX) 20 MG tablet Take 1 tablet (20 mg total) by mouth daily as needed (for swelling/weight gain). 90 tablet 1  . irbesartan (AVAPRO) 300 MG tablet Take 1 tablet (300 mg total) by mouth at bedtime. 90 tablet 1  . lactobacillus acidophilus (BACID) TABS tablet Take 1 tablet by mouth daily.    . metoprolol tartrate (LOPRESSOR) 25 MG tablet Take 25 mg by mouth every 8 (eight) hours as needed (a-fib).     . Multiple Vitamin (MULTIVITAMIN) tablet Take 1 tablet by mouth daily.     . nitroGLYCERIN (NITROSTAT) 0.4 MG SL tablet Place 1 tablet (0.4 mg total) under the tongue every 5 (five) minutes x 3 doses as needed for chest pain. 25 tablet 1  . omeprazole (PRILOSEC) 20 MG capsule Take 1 capsule (20 mg total) by mouth daily. Take daily for 6 weeks then can reduce back to every other day 90 capsule 1  . PRESCRIPTION MEDICATION Pt uses CPAP machine at bedtime    . sertraline (ZOLOFT) 25 MG tablet Take 1 tablet (25 mg total) by mouth daily. 90 tablet 3  . tamsulosin (FLOMAX) 0.4 MG CAPS capsule Take 0.4 mg by mouth daily.  11   No current facility-administered medications for this encounter.     Allergies:   Nsaids and Vancomycin   Social History:  The patient  reports that he has never smoked. He has never used smokeless tobacco. He reports that he does not drink alcohol or use drugs.   Family History:  The patient's  family history includes Breast cancer in his mother; Coronary artery disease in his father; Hypertension in his father and mother; Stroke in his father.    ROS:  Please see the history of present illness.   All other systems are personally reviewed and negative.    PHYSICAL EXAM: VS:  BP (!) 144/68   Pulse 63   Ht 5\' 7"  (1.702 m)   Wt 114.2 kg   SpO2 96%   BMI 39.44 kg/m  , BMI Body mass index is 39.44 kg/m. GEN: Well nourished, well developed, in no acute distress  HEENT: normal  Neck: no JVD, carotid bruits, or masses Cardiac: RRR; no murmurs, rubs, or gallops,no edema  Respiratory:  clear to auscultation bilaterally, normal work of breathing GI: soft, nontender, nondistended, + BS MS: no deformity or atrophy  Skin: warm and dry  Neuro:  Strength and sensation are intact Psych: euthymic mood, full affect  EKG:  EKG is ordered today. The ekg ordered today shows NSR at 63 bpm, pr int 184 ms, qrs int 106 ms, qtc 470 ms   Recent Labs: 02/04/2018: B Natriuretic Peptide 149.8 05/17/2018: Hemoglobin 13.5; Platelets 302 07/04/2018: ALT 26; BUN  18; Creatinine, Ser 1.38; Potassium 4.6; Sodium 142; TSH 5.468  personally reviewed   Lipid Panel     Component Value Date/Time   CHOL 157 02/05/2018 0227   TRIG 131 02/05/2018 0227   HDL 29 (L) 02/05/2018 0227   CHOLHDL 5.4 02/05/2018 0227   VLDL 26 02/05/2018 0227   LDLCALC 102 (H) 02/05/2018 0227   personally reviewed   Wt Readings from Last 3 Encounters:  07/04/18 114.2 kg  07/04/18 114.2 kg  05/25/18 114.4 kg      Other studies personally reviewed: Additional studies/  records that were reviewed today include: prior AF clinic notes, recent hospital records, prior echo  Review of the above records today demonstrates: as above   ASSESSMENT AND PLAN:  1.  Persistent afib S/p ablation  Maintaining  SR since the procedure  Will continue amiodarone until 3 month healing period is complete, probably will be stopped when seen in f/u with Dr. Johney Frame 11/20 Chads2vasc score is 2.  he is anticoagulated with eliquis.  2. OSA Compliant with CPAP  3. HTN Stable No change required today  4. CAD No ischemic symptoms Medical management   Current medicines are reviewed at length with the patient today.   The patient does not have concerns regarding his medicines.  The following changes were made today:  none  Labs/ tests ordered today include:  Orders Placed This Encounter  Procedures  . Comprehensive metabolic panel  . TSH  . EKG 12-Lead   F/u with Dr. Johney Frame 11/20, [name] 11/8  Signed, Rudi Coco, NP  07/04/2018 1:13 PM     [facility] [address] [place] [address] [phone] (office) [phone] (fax)

## 2018-07-04 NOTE — Assessment & Plan Note (Addendum)
100% compliance with CPAP Pressure set at 13cm; AHI 3.1  Needs new prescription for supplies FU in 1 year   Encourage weight loss, advised to continue to wear CPAP every night for 4-6 hours or more. Do not drive if experiencing excessive daytime fatigue or somnolence

## 2018-07-06 ENCOUNTER — Other Ambulatory Visit (HOSPITAL_COMMUNITY): Payer: Self-pay | Admitting: *Deleted

## 2018-07-07 DIAGNOSIS — R69 Illness, unspecified: Secondary | ICD-10-CM | POA: Diagnosis not present

## 2018-07-13 ENCOUNTER — Ambulatory Visit: Payer: Medicare HMO | Admitting: Internal Medicine

## 2018-07-26 ENCOUNTER — Other Ambulatory Visit: Payer: Self-pay | Admitting: Cardiology

## 2018-07-27 ENCOUNTER — Ambulatory Visit: Payer: Medicare HMO | Admitting: Cardiology

## 2018-08-03 DIAGNOSIS — N400 Enlarged prostate without lower urinary tract symptoms: Secondary | ICD-10-CM | POA: Diagnosis not present

## 2018-08-12 ENCOUNTER — Ambulatory Visit (INDEPENDENT_AMBULATORY_CARE_PROVIDER_SITE_OTHER): Payer: Medicare Other | Admitting: Cardiology

## 2018-08-12 ENCOUNTER — Encounter: Payer: Self-pay | Admitting: Cardiology

## 2018-08-12 VITALS — BP 151/79 | HR 67 | Ht 66.0 in | Wt 248.2 lb

## 2018-08-12 DIAGNOSIS — I2510 Atherosclerotic heart disease of native coronary artery without angina pectoris: Secondary | ICD-10-CM | POA: Diagnosis not present

## 2018-08-12 DIAGNOSIS — E782 Mixed hyperlipidemia: Secondary | ICD-10-CM | POA: Diagnosis not present

## 2018-08-12 DIAGNOSIS — I10 Essential (primary) hypertension: Secondary | ICD-10-CM | POA: Diagnosis not present

## 2018-08-12 DIAGNOSIS — I200 Unstable angina: Secondary | ICD-10-CM

## 2018-08-12 DIAGNOSIS — I4891 Unspecified atrial fibrillation: Secondary | ICD-10-CM

## 2018-08-12 MED ORDER — APIXABAN 5 MG PO TABS: 5.0000 mg | ORAL_TABLET | Freq: Two times a day (BID) | ORAL | 1 refills | Status: DC

## 2018-08-12 MED ORDER — CHLORTHALIDONE 25 MG PO TABS: 37.5000 mg | ORAL_TABLET | Freq: Every day | ORAL | 1 refills | Status: DC

## 2018-08-12 MED ORDER — ATORVASTATIN CALCIUM 20 MG PO TABS: 20.0000 mg | ORAL_TABLET | Freq: Every day | ORAL | 1 refills | Status: DC

## 2018-08-12 MED ORDER — METOPROLOL TARTRATE 25 MG PO TABS: 25.0000 mg | ORAL_TABLET | Freq: Three times a day (TID) | ORAL | 1 refills | Status: DC | PRN

## 2018-08-12 MED ORDER — IRBESARTAN 300 MG PO TABS: 300.0000 mg | ORAL_TABLET | Freq: Every day | ORAL | 1 refills | Status: DC

## 2018-08-12 MED ORDER — APIXABAN 5 MG PO TABS: 5.0000 mg | ORAL_TABLET | Freq: Two times a day (BID) | ORAL | 0 refills | Status: DC

## 2018-08-12 MED ORDER — DILTIAZEM HCL ER COATED BEADS 360 MG PO CP24: 360.0000 mg | ORAL_CAPSULE | Freq: Every day | ORAL | 1 refills | Status: DC

## 2018-08-12 NOTE — Progress Notes (Signed)
Clinical Summary Mr. Boyack is a 67 y.o.male  seen today for follow up of the followig medical problems.   1. HTN   - had gynecomastia on aldactone -Clinic numbers are often elevated.   - home sbp's at night 120s-140s.   2. CAD  - prior 2 vessel CABG in 2004 at [facility]  - apparently had follow up cath in 2006 from [place] with patent grafts. Reports negative stress test in 2011 by Dr Hyacinth Meeker in [place]. 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      3. Hyperlipidemia  - noted some side effects on higher lipitor doses, has tolerated lower dose well - compliant with statin  4. OSA  - compliant with CPAP  5. Afib - s/p ablation. He had failed rate control as well as multiple antiarrhythmics - no recent palpitations. No bleeding on eliquis  - recurrent afib 02/2018 - 05/2018 repeat ablation. - isolated episode of palpitation x 10 minutes.    6. Diastolic dysfunction - grade II by recent US.  - no recent symptoms   Past Medical History:  Diagnosis Date  . Anxiety   . Coronary artery disease    2v CABG, 2004 [facility])  . DJD (degenerative joint disease)   . HLD (hyperlipidemia)   . Hypertension   . MI, old 2004  . OA (osteoarthritis)   . Obesity   . PAF (paroxysmal atrial fibrillation) (HCC)    a. started on sotalol 08/2016.  Marland Kitchen Sleep apnea    on CPAP  . Spinal stenosis   . Typical atrial flutter (HCC)      Allergies  Allergen Reactions  . Nsaids Other (See Comments)    On blood thinner  . Vancomycin Other (See Comments)    Was informed after heart surgery ? Breathing problem/patient unsure     Current Outpatient Medications  Medication Sig Dispense Refill  . acetaminophen (TYLENOL) 650 MG CR tablet Take 650 mg by  mouth every 8 (eight) hours as needed for pain.    Marland Kitchen apixaban (ELIQUIS) 5 MG TABS tablet Take 1 tablet (5 mg total) by mouth 2 (two) times daily. 56 tablet 0  . atorvastatin (LIPITOR) 20 MG tablet Take 1 tablet (20 mg total) by mouth daily. 90 tablet 1  . chlorthalidone (HYGROTON) 25 MG tablet Take 1.5 tablets (37.5 mg total) by mouth daily. 135 tablet 1  . chlorthalidone (HYGROTON) 25 MG tablet TAKE 1.5 TABLETS (37.5 MG TOTAL) BY MOUTH DAILY. 135 tablet 1  . Coenzyme Q10 (CO Q 10) 100 MG CAPS Take 400 mg by mouth daily.     Marland Kitchen diltiazem (CARDIZEM CD) 360 MG 24 hr capsule TAKE 1 CAPSULE (360 MG TOTAL) BY MOUTH DAILY. 90 capsule 1  . fish oil-omega-3 fatty acids 1000 MG capsule Take 1 g by mouth 2 (two) times daily.     . furosemide (LASIX) 20 MG tablet Take 1 tablet (20 mg total) by mouth daily as needed (for swelling/weight gain). 90 tablet 1  . irbesartan (AVAPRO) 300 MG tablet Take 1 tablet (300 mg total) by mouth at bedtime. 90 tablet 1  . lactobacillus acidophilus (BACID) TABS tablet Take 1 tablet by mouth daily.    . metoprolol tartrate (LOPRESSOR) 25 MG tablet Take 25 mg by mouth  every 8 (eight) hours as needed (a-fib).     . Multiple Vitamin (MULTIVITAMIN) tablet Take 1 tablet by mouth daily.    . nitroGLYCERIN (NITROSTAT) 0.4 MG SL tablet Place 1 tablet (0.4 mg total) under the tongue every 5 (five) minutes x 3 doses as needed for chest pain. 25 tablet 1  . omeprazole (PRILOSEC) 20 MG capsule Take 1 capsule (20 mg total) by mouth daily. Take daily for 6 weeks then can reduce back to every other day 90 capsule 1  . PRESCRIPTION MEDICATION Pt uses CPAP machine at bedtime    . sertraline (ZOLOFT) 25 MG tablet Take 1 tablet (25 mg total) by mouth daily. 90 tablet 3  . tamsulosin (FLOMAX) 0.4 MG CAPS capsule Take 0.4 mg by mouth daily.  11   No current facility-administered medications for this visit.      Past Surgical History:  Procedure Laterality Date  . ATRIAL FIBRILLATION ABLATION  [name] 05/24/2018   Procedure: ATRIAL FIBRILLATION ABLATION;  Surgeon: Hillis Range, MD;  Location: [facility] INVASIVE CV LAB;  Service: Cardiovascular;  Laterality: [name];  . BACK SURGERY  2011  . CARPAL TUNNEL RELEASE     bilateral  . CORONARY ARTERY BYPASS GRAFT     2004  . ELECTROPHYSIOLOGIC STUDY [name] 09/15/2016   Procedure: Atrial Fibrillation Ablation;  Surgeon: Hillis Range, MD;  Location: [facility] INVASIVE CV LAB;  Service: Cardiovascular;  Laterality: [name];  . fistula surgery    . hemorrhoidectomy    . LEFT HEART CATH AND CORS/GRAFTS ANGIOGRAPHY [name] 02/07/2018   Procedure: LEFT HEART CATH AND CORS/GRAFTS ANGIOGRAPHY;  Surgeon: [name], Peter M, MD;  Location: [facility] INVASIVE CV LAB;  Service: Cardiovascular;  Laterality: [name];  . LUMBAR LAMINECTOMY    . right foot fracture    . TEE WITHOUT CARDIOVERSION [name] 09/15/2016   Procedure: TRANSESOPHAGEAL ECHOCARDIOGRAM (TEE);  Surgeon: Lewayne Bunting, MD;  Location: [facility] ENDOSCOPY;  Service: Cardiovascular;  Laterality: [name];  . TOTAL KNEE ARTHROPLASTY Left 07/07/2017   Procedure: LEFT TOTAL KNEE ARTHROPLASTY;  Surgeon: Ranee Gosselin, MD;  Location: [facility] ORS;  Service: Orthopedics;  Laterality: Left;  . TRANSTHORACIC ECHOCARDIOGRAM  08/25/2010   EF 55-60%     Allergies  Allergen Reactions  . Nsaids Other (See Comments)    On blood thinner  . Vancomycin Other (See Comments)    Was informed after heart surgery ? Breathing problem/patient unsure      Family History  Problem Relation Age of Onset  . Breast cancer Mother   . Hypertension Mother   . Coronary artery disease Father   . Stroke Father   . Hypertension Father      Social History Mr. Lemen reports that he has never smoked. He has never used smokeless tobacco. Mr. Winograd reports that he does not drink alcohol.   Review of Systems CONSTITUTIONAL: No weight loss, fever, chills, weakness or fatigue.  HEENT: Eyes: No visual loss, blurred vision, double vision or yellow sclerae.No hearing  loss, sneezing, congestion, runny nose or sore throat.  SKIN: No rash or itching.  CARDIOVASCULAR: per hpi RESPIRATORY: No shortness of breath, cough or sputum.  GASTROINTESTINAL: No anorexia, nausea, vomiting or diarrhea. No abdominal pain or blood.  GENITOURINARY: No burning on urination, no polyuria NEUROLOGICAL: No headache, dizziness, syncope, paralysis, ataxia, numbness or tingling in the extremities. No change in bowel or bladder control.  MUSCULOSKELETAL: No muscle, back pain, joint pain or stiffness.  LYMPHATICS: No enlarged nodes. No history of splenectomy.  PSYCHIATRIC: No history of  depression or anxiety.  ENDOCRINOLOGIC: No reports of sweating, cold or heat intolerance. No polyuria or polydipsia.  Marland Kitchen   Physical Examination Vitals:   08/12/18 1501  BP: (!) 151/79  Pulse: 67  SpO2: 97%   Vitals:   08/12/18 1501  Weight: 248 lb 3.2 oz (112.6 kg)  Height: 5\' 6"  (1.676 m)    Gen: resting comfortably, no acute distress HEENT: no scleral icterus, pupils equal round and reactive, no palptable cervical adenopathy,  CV: RRR, no m/r/g no jvd Resp: Clear to auscultation bilaterally GI: abdomen is soft, non-tender, non-distended, normal bowel sounds, no hepatosplenomegaly MSK: extremities are warm, no edema.  Skin: warm, no rash Neuro:  no focal deficits Psych: appropriate affect   Diagnostic Studies 03/2015 echo Study Conclusions  - Left ventricle: The cavity size was normal. Wall thickness was increased in a pattern of mild LVH. Systolic function was normal. The estimated ejection fraction was in the range of 50% to 55%. Wall motion was normal; there were no regional wall motion abnormalities. Doppler parameters are consistent with high ventricular filling pressure. - Left atrium: The atrium was mildly dilated.   03/2015 Lexiscan MPI  No T wave inversion was noted during stress.  There was no ST segment deviation noted during stress.  The study is  normal.  This is a low risk study.  The left ventricular ejection fraction is normal (55-65%).  Nuclear stress EF: 61%.     Assessment and Plan  1. HTN - often with elevated clinic numbers - home numbers are reasonable, he is not excited about starting additional bp med particularly given our limited options, likely would consider hydralazine. Continue to work on dietary modification at this time.   2. CAD  -no recent symptoms, continue current meds  3. Hyperlipidemia  - did not tolerate higher dose statin - he will continue current statin.   4. OSA  - continue CPAP  5. Afib -followed by EP, recent repeat ablation for afib. Doing well         Antoine Poche, M.D.

## 2018-08-12 NOTE — Patient Instructions (Signed)

## 2018-08-15 ENCOUNTER — Encounter: Payer: Self-pay | Admitting: Cardiology

## 2018-08-24 ENCOUNTER — Ambulatory Visit (INDEPENDENT_AMBULATORY_CARE_PROVIDER_SITE_OTHER): Payer: Medicare Other | Admitting: Internal Medicine

## 2018-08-24 ENCOUNTER — Encounter: Payer: Self-pay | Admitting: Internal Medicine

## 2018-08-24 VITALS — BP 160/84 | HR 66 | Ht 66.0 in | Wt 248.2 lb

## 2018-08-24 DIAGNOSIS — I10 Essential (primary) hypertension: Secondary | ICD-10-CM | POA: Diagnosis not present

## 2018-08-24 DIAGNOSIS — I4819 Other persistent atrial fibrillation: Secondary | ICD-10-CM | POA: Diagnosis not present

## 2018-08-24 DIAGNOSIS — I200 Unstable angina: Secondary | ICD-10-CM

## 2018-08-24 DIAGNOSIS — G4733 Obstructive sleep apnea (adult) (pediatric): Secondary | ICD-10-CM

## 2018-08-24 DIAGNOSIS — I2510 Atherosclerotic heart disease of native coronary artery without angina pectoris: Secondary | ICD-10-CM

## 2018-08-24 NOTE — Patient Instructions (Addendum)
Medication Instructions:  Your physician recommends that you continue on your current medications as directed. Please refer to the Current Medication list given to you today.  Labwork: None ordered.  Testing/Procedures: None ordered.  Follow-Up: Your physician wants you to follow-up in:  3 months with Dr. Johney Frame.      Any Other Special Instructions Will Be Listed Below (If Applicable).  If you need a refill on your cardiac medications before your next appointment, please call your pharmacy.    Low Sodium diet   DASH Eating Plan DASH stands for "Dietary Approaches to Stop Hypertension." The DASH eating plan is a healthy eating plan that has been shown to reduce high blood pressure (hypertension). It may also reduce your risk for type 2 diabetes, heart disease, and stroke. The DASH eating plan may also help with weight loss. What are tips for following this plan? General guidelines  Avoid eating more than 2,300 mg (milligrams) of salt (sodium) a day. If you have hypertension, you may need to reduce your sodium intake to 1,500 mg a day.  Limit alcohol intake to no more than 1 drink a day for nonpregnant women and 2 drinks a day for men. One drink equals 12 oz of beer, 5 oz of wine, or 1 oz of hard liquor.  Work with your health care provider to maintain a healthy body weight or to lose weight. Ask what an ideal weight is for you.  Get at least 30 minutes of exercise that causes your heart to beat faster (aerobic exercise) most days of the week. Activities may include walking, swimming, or biking.  Work with your health care provider or diet and nutrition specialist (dietitian) to adjust your eating plan to your individual calorie needs. Reading food labels  Check food labels for the amount of sodium per serving. Choose foods with less than 5 percent of the Daily Value of sodium. Generally, foods with less than 300 mg of sodium per serving fit into this eating plan.  To find whole  grains, look for the word "whole" as the first word in the ingredient list. Shopping  Buy products labeled as "low-sodium" or "no salt added."  Buy fresh foods. Avoid canned foods and premade or frozen meals. Cooking  Avoid adding salt when cooking. Use salt-free seasonings or herbs instead of table salt or sea salt. Check with your health care provider or pharmacist before using salt substitutes.  Do not fry foods. Cook foods using healthy methods such as baking, boiling, grilling, and broiling instead.  Cook with heart-healthy oils, such as olive, canola, soybean, or sunflower oil. Meal planning   Eat a balanced diet that includes: ? 5 or more servings of fruits and vegetables each day. At each meal, try to fill half of your plate with fruits and vegetables. ? Up to 6-8 servings of whole grains each day. ? Less than 6 oz of lean meat, poultry, or fish each day. A 3-oz serving of meat is about the same size as a deck of cards. One egg equals 1 oz. ? 2 servings of low-fat dairy each day. ? A serving of nuts, seeds, or beans 5 times each week. ? Heart-healthy fats. Healthy fats called Omega-3 fatty acids are found in foods such as flaxseeds and coldwater fish, like sardines, salmon, and mackerel.  Limit how much you eat of the following: ? Canned or prepackaged foods. ? Food that is high in trans fat, such as fried foods. ? Food that is high in  saturated fat, such as fatty meat. ? Sweets, desserts, sugary drinks, and other foods with added sugar. ? Full-fat dairy products.  Do not salt foods before eating.  Try to eat at least 2 vegetarian meals each week.  Eat more home-cooked food and less restaurant, buffet, and fast food.  When eating at a restaurant, ask that your food be prepared with less salt or no salt, if possible. What foods are recommended? The items listed may not be a complete list. Talk with your dietitian about what dietary choices are best for  you. Grains Whole-grain or whole-wheat bread. Whole-grain or whole-wheat pasta. Brown rice. [place]ena Morrow. Bulgur. Whole-grain and low-sodium cereals. Pita bread. Low-fat, low-sodium crackers. Whole-wheat flour tortillas. Vegetables Fresh or frozen vegetables (raw, steamed, roasted, or grilled). Low-sodium or reduced-sodium tomato and vegetable juice. Low-sodium or reduced-sodium tomato sauce and tomato paste. Low-sodium or reduced-sodium canned vegetables. Fruits All fresh, dried, or frozen fruit. Canned fruit in natural juice (without added sugar). Meat and other protein foods Skinless chicken or [place]. Ground chicken or [place]. Pork with fat trimmed off. Fish and seafood. Egg whites. Dried beans, peas, or lentils. Unsalted nuts, nut butters, and seeds. Unsalted canned beans. Lean cuts of beef with fat trimmed off. Low-sodium, lean deli meat. Dairy Low-fat (1%) or fat-free (skim) milk. Fat-free, low-fat, or reduced-fat cheeses. Nonfat, low-sodium ricotta or cottage cheese. Low-fat or nonfat yogurt. Low-fat, low-sodium cheese. Fats and oils Soft margarine without trans fats. Vegetable oil. Low-fat, reduced-fat, or light mayonnaise and salad dressings (reduced-sodium). Canola, safflower, olive, soybean, and sunflower oils. Avocado. Seasoning and other foods Herbs. Spices. Seasoning mixes without salt. Unsalted popcorn and pretzels. Fat-free sweets. What foods are not recommended? The items listed may not be a complete list. Talk with your dietitian about what dietary choices are best for you. Grains Baked goods made with fat, such as croissants, muffins, or some breads. Dry pasta or rice meal packs. Vegetables Creamed or fried vegetables. Vegetables in a cheese sauce. Regular canned vegetables (not low-sodium or reduced-sodium). Regular canned tomato sauce and paste (not low-sodium or reduced-sodium). Regular tomato and vegetable juice (not low-sodium or reduced-sodium). Angie Fava.  Olives. Fruits Canned fruit in a light or heavy syrup. Fried fruit. Fruit in cream or butter sauce. Meat and other protein foods Fatty cuts of meat. Ribs. Fried meat. Berniece Salines. Sausage. Bologna and other processed lunch meats. Salami. Fatback. Hotdogs. Bratwurst. Salted nuts and seeds. Canned beans with added salt. Canned or smoked fish. Whole eggs or egg yolks. Chicken or [place] with skin. Dairy Whole or 2% milk, cream, and half-and-half. Whole or full-fat cream cheese. Whole-fat or sweetened yogurt. Full-fat cheese. Nondairy creamers. Whipped toppings. Processed cheese and cheese spreads. Fats and oils Butter. Stick margarine. Lard. Shortening. Ghee. Bacon fat. Tropical oils, such as coconut, palm kernel, or palm oil. Seasoning and other foods Salted popcorn and pretzels. Onion salt, garlic salt, seasoned salt, table salt, and sea salt. Worcestershire sauce. Tartar sauce. Barbecue sauce. Teriyaki sauce. Soy sauce, including reduced-sodium. Steak sauce. Canned and packaged gravies. Fish sauce. Oyster sauce. Cocktail sauce. Horseradish that you find on the shelf. Ketchup. Mustard. Meat flavorings and tenderizers. Bouillon cubes. Hot sauce and Tabasco sauce. Premade or packaged marinades. Premade or packaged taco seasonings. Relishes. Regular salad dressings. Where to find more information:  National Heart, Lung, and [place]: https://wilson-eaton.com/  American Heart Association: www.heart.org Summary  The DASH eating plan is a healthy eating plan that has been shown to reduce high blood pressure (hypertension). It may also  reduce your risk for type 2 diabetes, heart disease, and stroke.  With the DASH eating plan, you should limit salt (sodium) intake to 2,300 mg a day. If you have hypertension, you may need to reduce your sodium intake to 1,500 mg a day.  When on the DASH eating plan, aim to eat more fresh fruits and vegetables, whole grains, lean proteins, low-fat dairy, and heart-healthy  fats.  Work with your health care provider or diet and nutrition specialist (dietitian) to adjust your eating plan to your individual calorie needs. This information is not intended to replace advice given to you by your health care provider. Make sure you discuss any questions you have with your health care provider. Document Released: 09/10/2011 Document Revised: 09/14/2016 Document Reviewed: 09/14/2016 Elsevier Interactive Patient Education  [employer].

## 2018-08-24 NOTE — Progress Notes (Signed)
PCP: Patient, No Pcp Per Primary Cardiologist: Dr Clide Deutscher is a 67 y.o. male who presents today for routine electrophysiology followup.  Since his recent afib ablation, the patient reports doing very well.  he denies procedure related complications and is pleased with the results of the procedure.  Today, he denies symptoms of palpitations, chest pain, shortness of breath,  lower extremity edema, dizziness, presyncope, or syncope.  The patient is otherwise without complaint today.   Past Medical History:  Diagnosis Date  . Anxiety   . Coronary artery disease    2v CABG, 2004 [facility])  . DJD (degenerative joint disease)   . HLD (hyperlipidemia)   . Hypertension   . MI, old 2004  . OA (osteoarthritis)   . Obesity   . PAF (paroxysmal atrial fibrillation) (HCC)    a. started on sotalol 08/2016.  Marland Kitchen Sleep apnea    on CPAP  . Spinal stenosis   . Typical atrial flutter [facility])    Past Surgical History:  Procedure Laterality Date  . ATRIAL FIBRILLATION ABLATION [name] 05/24/2018   Procedure: ATRIAL FIBRILLATION ABLATION;  Surgeon: Hillis Range, MD;  Location: [facility] INVASIVE CV LAB;  Service: Cardiovascular;  Laterality: [name];  . BACK SURGERY  2011  . CARPAL TUNNEL RELEASE     bilateral  . CORONARY ARTERY BYPASS GRAFT     2004  . ELECTROPHYSIOLOGIC STUDY [name] 09/15/2016   Procedure: Atrial Fibrillation Ablation;  Surgeon: Hillis Range, MD;  Location: [facility] INVASIVE CV LAB;  Service: Cardiovascular;  Laterality: [name];  . fistula surgery    . hemorrhoidectomy    . LEFT HEART CATH AND CORS/GRAFTS ANGIOGRAPHY [name] 02/07/2018   Procedure: LEFT HEART CATH AND CORS/GRAFTS ANGIOGRAPHY;  Surgeon: [name], Peter M, MD;  Location: [facility] INVASIVE CV LAB;  Service: Cardiovascular;  Laterality: [name];  . LUMBAR LAMINECTOMY    . right foot fracture    . TEE WITHOUT CARDIOVERSION [name] 09/15/2016   Procedure: TRANSESOPHAGEAL ECHOCARDIOGRAM (TEE);  Surgeon: Lewayne Bunting, MD;  Location: [facility] ENDOSCOPY;   Service: Cardiovascular;  Laterality: [name];  . TOTAL KNEE ARTHROPLASTY Left 07/07/2017   Procedure: LEFT TOTAL KNEE ARTHROPLASTY;  Surgeon: Ranee Gosselin, MD;  Location: [facility] ORS;  Service: Orthopedics;  Laterality: Left;  . TRANSTHORACIC ECHOCARDIOGRAM  08/25/2010   EF 55-60%    ROS- all systems are personally reviewed and negatives except as per HPI above  Current Outpatient Medications  Medication Sig Dispense Refill  . acetaminophen (TYLENOL) 650 MG CR tablet Take 650 mg by mouth every 8 (eight) hours as needed for pain.    Marland Kitchen apixaban (ELIQUIS) 5 MG TABS tablet Take 1 tablet (5 mg total) by mouth 2 (two) times daily. 28 tablet 0  . atorvastatin (LIPITOR) 20 MG tablet Take 1 tablet (20 mg total) by mouth daily. 90 tablet 1  . chlorthalidone (HYGROTON) 25 MG tablet Take 1.5 tablets (37.5 mg total) by mouth daily. 135 tablet 1  . Coenzyme Q10 (CO Q 10) 100 MG CAPS Take 400 mg by mouth daily.     Marland Kitchen diltiazem (CARDIZEM CD) 360 MG 24 hr capsule Take 1 capsule (360 mg total) by mouth daily. 90 capsule 1  . fish oil-omega-3 fatty acids 1000 MG capsule Take 1 g by mouth 2 (two) times daily.     . furosemide (LASIX) 20 MG tablet Take 1 tablet (20 mg total) by mouth daily as needed (for swelling/weight gain). 90 tablet 1  . irbesartan (AVAPRO) 300 MG tablet Take 1  tablet (300 mg total) by mouth at bedtime. 90 tablet 1  . metoprolol tartrate (LOPRESSOR) 25 MG tablet Take 1 tablet (25 mg total) by mouth every 8 (eight) hours as needed (a-fib). 180 tablet 1  . Multiple Vitamin (MULTIVITAMIN) tablet Take 1 tablet by mouth daily.    . nitroGLYCERIN (NITROSTAT) 0.4 MG SL tablet Place 1 tablet (0.4 mg total) under the tongue every 5 (five) minutes x 3 doses as needed for chest pain. 25 tablet 1  . omeprazole (PRILOSEC) 20 MG capsule Take 1 capsule (20 mg total) by mouth daily. Take daily for 6 weeks then can reduce back to every other day 90 capsule 1  . PRESCRIPTION MEDICATION Pt uses CPAP machine at  bedtime    . sertraline (ZOLOFT) 25 MG tablet Take 1 tablet (25 mg total) by mouth daily. 90 tablet 3   No current facility-administered medications for this visit.     Physical Exam: Vitals:   08/24/18 0922  BP: (!) 160/84  Pulse: 66  SpO2: 98%  Weight: 248 lb 3.2 oz (112.6 kg)  Height: 5\' 6"  (1.676 m)    GEN- The patient is well appearing, alert and oriented x 3 today.   Head- normocephalic, atraumatic Eyes-  Sclera clear, conjunctiva pink Ears- hearing intact Oropharynx- clear Lungs- Clear to ausculation bilaterally, normal work of breathing Heart- Regular rate and rhythm, no murmurs, rubs or gallops, PMI not laterally displaced GI- soft, NT, ND, + BS Extremities- no clubbing, cyanosis, or edema  EKG tracing ordered today is personally reviewed and shows sinus rhythm 66 bpm, PR 182 msec, QRS 110 msec, Qtc 461 msec  Assessment and Plan:  1. Persistent atrial fibrillation and atrial flutter Doing well s/p ablation chads2vasc score is 2.  continue anticoagulation  2. Obesity Body mass index is 40.06 kg/m. Lifestyle modification encouraged again today Wt Readings from Last 3 Encounters:  08/24/18 248 lb 3.2 oz (112.6 kg)  08/12/18 248 lb 3.2 oz (112.6 kg)  07/04/18 251 lb 12.8 oz (114.2 kg)   3. OSA Compliant with CPAP  4. HTN Elevated We discussed amlodipine.  He does not wish to make changes today 2 gram sodium diet discussed  Return to see me in 3 months  Hillis Range MD, [facility] 08/24/2018 9:43 AM

## 2018-08-29 ENCOUNTER — Telehealth: Payer: Self-pay | Admitting: *Deleted

## 2018-08-29 MED ORDER — APIXABAN 5 MG PO TABS: 5.0000 mg | ORAL_TABLET | Freq: Two times a day (BID) | ORAL | 0 refills | Status: DC

## 2018-08-29 NOTE — Telephone Encounter (Signed)
Samples of eliquis

## 2018-09-07 ENCOUNTER — Telehealth: Payer: Self-pay | Admitting: *Deleted

## 2018-09-07 ENCOUNTER — Encounter (HOSPITAL_COMMUNITY): Payer: Self-pay | Admitting: Emergency Medicine

## 2018-09-07 ENCOUNTER — Ambulatory Visit (HOSPITAL_COMMUNITY)
Admission: EM | Admit: 2018-09-07 | Discharge: 2018-09-07 | Disposition: A | Discharge status: Home / Self Care | Payer: Medicare Other | Attending: Family Medicine | Admitting: Family Medicine

## 2018-09-07 DIAGNOSIS — M109 Gout, unspecified: Secondary | ICD-10-CM | POA: Diagnosis not present

## 2018-09-07 MED ORDER — PREDNISONE 50 MG PO TABS: ORAL_TABLET | ORAL | 1 refills | Status: DC

## 2018-09-07 NOTE — Discharge Instructions (Addendum)
Take 1 prednisone daily with food until the gout is completely gone  Stop the chlorthalidone because it is known to induce gout  I do not recommend allopurinol because of its interaction with other medications and because it can cause kidney problems  If you see increasing swelling in your extremities when you are taking prednisone, take furosemide  Use the metoprolol not only to control your heart rate but also to control your blood pressure.  You would take the 25 mg tablet of metoprolol if your pressure stays high.  Share this information with your doctor to make sure he does not want another approach to your gout and blood pressure.

## 2018-09-07 NOTE — ED Triage Notes (Signed)
Pt states for the last three or four days his R foot has been hurting, denies injury, states he has had gout before and thinks its gout.

## 2018-09-07 NOTE — ED Provider Notes (Signed)
[facility]    CSN: [id number] Arrival date & time: 09/07/18  1105     History   Chief Complaint Chief Complaint  Patient presents with  . Foot Pain    HPI [name] [name] is a 67 y.o. male.   Pt states for the last three or four days his R foot has been hurting, denies injury, states he has had gout before and thinks its gout.      Past Medical History:  Diagnosis Date  . Anxiety   . Coronary artery disease    2v CABG, 2004 [facility])  . DJD (degenerative joint disease)   . HLD (hyperlipidemia)   . Hypertension   . MI, old 2004  . OA (osteoarthritis)   . Obesity   . PAF (paroxysmal atrial fibrillation) (HCC)    a. started on sotalol 08/2016.  Marland Kitchen Sleep apnea    on CPAP  . Spinal stenosis   . Typical atrial flutter [facility])     Patient Active Problem List   Diagnosis Date Noted  . Paroxysmal atrial fibrillation (HCC) 05/24/2018  . Unstable angina (HCC) 02/04/2018  . Typical atrial flutter (HCC)   . Spinal stenosis   . Sleep apnea   . QT prolongation   . Palpitations   . PAF (paroxysmal atrial fibrillation) (HCC)   . OA (osteoarthritis)   . Hypertension   . HLD (hyperlipidemia)   . Edema   . DJD (degenerative joint disease)   . Coronary artery disease   . Chronic chest pain   . 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 A-fib (HCC) 09/15/2016  . Chest pain 09/09/2016  . Obesity 08/22/2016  . Anxiety 08/22/2016  . Encounter for monitoring sotalol therapy 08/20/2016  . Morbidly obese (HCC) 07/12/2016  . Medical non-compliance 07/12/2016  . Elevated TSH 07/09/2016  . BPH without urinary obstruction 07/09/2016  . Need for hepatitis C screening test 07/09/2016  . Idiopathic gout 07/09/2016  . Hyperglycemia 08/01/2015  . Atrial fibrillation with RVR (HCC) 03/28/2015  . Routine general medical examination at a health care facility 08/11/2013  . Chronic sinus bradycardia   . Obstructive sleep apnea 09/12/2010  . MYOCARDIAL INFARCTION  09/11/2010  . Spinal stenosis, unspecified region other than cervical 09/11/2010  . Hyperlipidemia with target LDL less than 70 09/08/2010  . Depression 09/08/2010  . Essential hypertension 09/08/2010  . Coronary atherosclerosis 09/08/2010  . Congestive heart failure (HCC) 09/08/2010  . GERD 09/08/2010  . MI, old 10/05/2002    Past Surgical History:  Procedure Laterality Date  . ATRIAL FIBRILLATION ABLATION [name] 05/24/2018   Procedure: ATRIAL FIBRILLATION ABLATION;  Surgeon: Hillis Range, MD;  Location: [facility] INVAS[facility] CV LAB;  Service: Cardiovascular;  Laterality: [name];  . BACK SURGERY  2011  . CARPAL TUNNEL RELEASE     bilateral  . CORONARY ARTERY BYPASS GRAFT     2004  . ELECTROPHYSIOLOGIC STUDY [name] 09/15/2016   Procedure: Atrial Fibrillation Ablation;  Surgeon: Hillis Range, MD;  Location: [facility] INVASIVE CV LAB;  Service: Cardiovascular;  Laterality: [name];  . fistula surgery    . hemorrhoidectomy    . LEFT HEART CATH AND CORS/GRAFTS ANGIOGRAPHY [name] 02/07/2018   Procedure: LEFT HEART CATH AND CORS/GRAFTS ANGIOGRAPHY;  Surgeon: [name], Peter M, MD;  Location: [facility] INVASIVE CV LAB;  Service: Cardiovascular;  Laterality: [name];  . LUMBAR LAMINECTOMY    . right foot fracture    . TEE WITHOUT CARDIOVERSION [name] 09/15/2016   Procedure: TRANSESOPHAGEAL ECHOCARDIOGRAM (TEE);  Surgeon:  Lewayne Bunting, MD;  Location: [facility] ENDOSCOPY;  Service: Cardiovascular;  Laterality: [name];  . TOTAL KNEE ARTHROPLASTY Left 07/07/2017   Procedure: LEFT TOTAL KNEE ARTHROPLASTY;  Surgeon: Ranee Gosselin, MD;  Location: [facility] ORS;  Service: Orthopedics;  Laterality: Left;  . TRANSTHORACIC ECHOCARDIOGRAM  08/25/2010   EF 55-60%       Home Medications    Prior to Admission medications   Medication Sig Start Date End Date Taking? Authorizing Provider  acetaminophen (TYLENOL) 650 MG CR tablet Take 650 mg by mouth every 8 (eight) hours as needed for pain.    [provider]  apixaban (ELIQUIS) 5 MG TABS tablet Take  1 tablet (5 mg total) by mouth 2 (two) times daily. 08/29/18   Antoine Poche, MD  atorvastatin (LIPITOR) 20 MG tablet Take 1 tablet (20 mg total) by mouth daily. 08/12/18   Antoine Poche, MD  chlorthalidone (HYGROTON) 25 MG tablet Take 1.5 tablets (37.5 mg total) by mouth daily. 08/12/18   Antoine Poche, MD  Coenzyme Q10 (CO Q 10) 100 MG CAPS Take 400 mg by mouth daily.     [provider]  diltiazem (CARDIZEM CD) 360 MG 24 hr capsule Take 1 capsule (360 mg total) by mouth daily. 08/12/18   Antoine Poche, MD  fish oil-omega-3 fatty acids 1000 MG capsule Take 1 g by mouth 2 (two) times daily.     [provider]  furosemide (LASIX) 20 MG tablet Take 1 tablet (20 mg total) by mouth daily as needed (for swelling/weight gain). 07/01/18   Antoine Poche, MD  irbesartan (AVAPRO) 300 MG tablet Take 1 tablet (300 mg total) by mouth at bedtime. 08/12/18 08/12/19  Antoine Poche, MD  metoprolol tartrate (LOPRESSOR) 25 MG tablet Take 1 tablet (25 mg total) by mouth every 8 (eight) hours as needed (a-fib). 08/12/18   Antoine Poche, MD  Multiple Vitamin (MULTIVITAMIN) tablet Take 1 tablet by mouth daily.    [provider]  nitroGLYCERIN (NITROSTAT) 0.4 MG SL tablet Place 1 tablet (0.4 mg total) under the tongue every 5 (five) minutes x 3 doses as needed for chest pain. 02/07/18   Arty Baumgartner, NP  omeprazole (PRILOSEC) 20 MG capsule Take 1 capsule (20 mg total) by mouth daily. Take daily for 6 weeks then can reduce back to every other day 05/25/18   Marily Lente, NP  predniSONE (DELTASONE) 50 MG tablet One daily with food until gout subsides. 09/07/18   Elvina Sidle, MD  PRESCRIPTION MEDICATION Pt uses CPAP machine at bedtime    [provider]  sertraline (ZOLOFT) 25 MG tablet Take 1 tablet (25 mg total) by mouth daily. 11/03/17   Antoine Poche, MD    Family History Family History  Problem Relation Age of Onset  . Breast cancer  Mother   . Hypertension Mother   . Coronary artery disease Father   . Stroke Father   . Hypertension Father     Social History Social History   Tobacco Use  . Smoking status: Never Smoker  . Smokeless tobacco: Never Used  Substance Use Topics  . Alcohol use: No    Alcohol/week: 0.0 standard drinks  . Drug use: No     Allergies   Nsaids and Vancomycin   Review of Systems Review of Systems   Physical Exam Triage Vital Signs ED Triage Vitals [09/07/18 1256]  Enc Vitals Group     BP (!) 167/90  Pulse Rate 75     Resp 18     Temp (!) 96.7 F (35.9 C)     Temp src      SpO2 98 %     Weight      Height      Head Circumference      Peak Flow      Pain Score 7     Pain Loc      Pain Edu?      Excl. in GC?    No data found.  Updated Vital Signs BP (!) 167/90   Pulse 75   Temp (!) 96.7 F (35.9 C)   Resp 18   SpO2 98%    Physical Exam  Constitutional: He is oriented to person, place, and time. He appears well-developed and well-nourished.  HENT:  Right Ear: External ear normal.  Left Ear: External ear normal.  Eyes: Conjunctivae are normal.  Neck: Normal range of motion. Neck supple.  Pulmonary/Chest: Effort normal.  Abdominal: Soft.  Musculoskeletal: He exhibits edema and tenderness.  Tender and red right lateral malleolus with erythema.  Neurological: He is alert and oriented to person, place, and time.  Skin: Skin is warm and dry. There is erythema.  Psychiatric: He has a normal mood and affect.  Nursing note and vitals reviewed.    UC Treatments / Results  Labs (all labs ordered are listed, but only abnormal results are displayed) Labs Reviewed - No data to display  EKG None  Radiology No results found.  Procedures Procedures (including critical care time)  Medications Ordered in UC Medications - No data to display  Initial Impression / Assessment and Plan / UC Course  I have reviewed the triage vital signs and the nursing  notes.  Pertinent labs & imaging results that were available during my care of the patient were reviewed by me and considered in my medical decision making (see chart for details).    Final Clinical Impressions(s) / UC Diagnoses   Final diagnoses:  Acute gout of right ankle, unspecified cause     Discharge Instructions     Take 1 prednisone daily with food until the gout is completely gone  Stop the chlorthalidone because it is known to induce gout  I do not recommend allopurinol because of its interaction with other medications and because it can cause kidney problems  If you see increasing swelling in your extremities when you are taking prednisone, take furosemide  Use the metoprolol not only to control your heart rate but also to control your blood pressure.  You would take the 25 mg tablet of metoprolol if your pressure stays high.  Share this information with your doctor to make sure he does not want another approach to your gout and blood pressure.    ED Prescriptions    Medication Sig Dispense Auth. Provider   predniSONE (DELTASONE) 50 MG tablet One daily with food until gout subsides. 5 tablet Elvina Sidle, MD     Controlled Substance Prescriptions [place] Controlled Substance Registry consulted? Not Applicable   Elvina Sidle, MD 09/07/18 1544

## 2018-09-07 NOTE — Telephone Encounter (Signed)
Pt walked into [address] office - had went to urgent care for gout and was taken off of chlorthalidone and suggested that pt contact cardiologist to see what he could take in place of chlorthalidone - pharmacist at [address] office suggested an ace or arb - will forward to Dr Wyline Mood

## 2018-09-08 MED ORDER — HYDRALAZINE HCL 50 MG PO TABS: 50.0000 mg | ORAL_TABLET | Freq: Three times a day (TID) | ORAL | 1 refills | Status: DC

## 2018-09-08 NOTE — Telephone Encounter (Signed)
Much better today.  Walking without pain. FBS today 150 or so

## 2018-09-08 NOTE — Telephone Encounter (Signed)
He is already on ARB. Ok to stop chlorthalidone, can he start hydralazine 50mg  tid.   Dominga Ferry MD

## 2018-09-08 NOTE — Telephone Encounter (Signed)
Pt voiced understanding - sent hydralazine to [facility] [address]

## 2018-09-08 NOTE — Addendum Note (Signed)
Addended by: Burman Nieves T on: 09/08/2018 02:24 PM   Modules accepted: Orders

## 2018-09-19 ENCOUNTER — Telehealth: Payer: Self-pay | Admitting: Cardiology

## 2018-09-19 NOTE — Telephone Encounter (Signed)
Came off BP meds and he doesn't think that he can take new medication. [place] Spaniel that it is giving him headaches and BP really high.

## 2018-09-19 NOTE — Telephone Encounter (Signed)
Said he took hydralazine for 2-3 days (took hydralazine 50 mg BID vs TID) and developed a headache that last 2-3 days. Took tylenol for headache and said it didn't help. Took last dose of hydralazine 2-3 days ago and headache stopped. BP today an hour agao 157/83 & HR 54. No c/o dizziness, chest pain or sob.  BP upon awakening this morning was 153/86 Last night SBP was in the 140's. Patient said he started taking metoprolol tartrate 25 mg daily since he stopped hydralazine. Advised patient to try taking 1/2 tablet of hydralazine twice daily and if his headache returns, to stop taking it and call us back. Verbalized understanding.

## 2018-09-20 MED ORDER — EPLERENONE 25 MG PO TABS: 25.0000 mg | ORAL_TABLET | Freq: Every day | ORAL | 3 refills | Status: DC

## 2018-09-20 NOTE — Telephone Encounter (Signed)
Pt wants to try Eplerenone - says will let us know if he can afford - in the meantime he will continue to try 1/2 tablet of hydralazine and continue to monitor BP - Medication sent to pharmacy - pt aware that he would need BMP in 2 weeks after starting new medication

## 2018-09-20 NOTE — Telephone Encounter (Signed)
Patient called office

## 2018-09-20 NOTE — Telephone Encounter (Signed)
Pt left VM that SBP was still running 150s-160s and still c/o headache after taking 1/2 tablet of hydralazine - wanted to know if he should change dose again or does he need to change medications

## 2018-09-20 NOTE — Telephone Encounter (Signed)
Can we add chlorthalidone to his allergy list with side effect gout. Can we list aldactone as an allergy with gynecomastia as side effect. Limited options if cannot tolerate hydralazine due to what he is already on and prior side effects to other meds. I think a medicine called eplerenone would be an option but can sometimes be expensive. Can we try a Rx for eplerenone 25mg  daily, if too expensive have him call us back. If he starts would need a BMET in 2 weeks   Dominga Ferry MD

## 2018-09-21 ENCOUNTER — Other Ambulatory Visit: Payer: Self-pay | Admitting: Cardiology

## 2018-09-23 ENCOUNTER — Other Ambulatory Visit: Payer: Self-pay | Admitting: *Deleted

## 2018-09-23 ENCOUNTER — Telehealth: Payer: Self-pay | Admitting: Cardiology

## 2018-09-23 DIAGNOSIS — I10 Essential (primary) hypertension: Secondary | ICD-10-CM

## 2018-09-23 NOTE — Telephone Encounter (Signed)
Patient called wanting to know if he can take his fluid pill while taking the Eplerenone.

## 2018-09-23 NOTE — Telephone Encounter (Signed)
Patient advised that its okay to take inspra with furosemide. Patient said he will start it tomorrow and do lab work in 2weeks. Lab order faxed to [facility].

## 2018-09-26 ENCOUNTER — Ambulatory Visit (INDEPENDENT_AMBULATORY_CARE_PROVIDER_SITE_OTHER): Payer: Medicare Other | Admitting: *Deleted

## 2018-09-26 ENCOUNTER — Telehealth: Payer: Self-pay | Admitting: Cardiology

## 2018-09-26 VITALS — BP 164/76 | HR 65 | Ht 66.0 in | Wt 249.0 lb

## 2018-09-26 DIAGNOSIS — I10 Essential (primary) hypertension: Secondary | ICD-10-CM | POA: Diagnosis not present

## 2018-09-26 MED ORDER — APIXABAN 5 MG PO TABS: 5.0000 mg | ORAL_TABLET | Freq: Two times a day (BID) | ORAL | 0 refills | Status: DC

## 2018-09-26 NOTE — Progress Notes (Signed)
Present to office for nurse BP check and to have his home BP monitor checked for comparison. Has taken all doses of medications prescribed without noted side effects. No c/o dizziness, chest pain or sob. C/O continued headaches rated 3/10 Home BP monitor result right arm 173/94 & HR 68. 1+ edema BLE. Patient advised that he needed to give his new medication more time to be effective. Patient advised that he needed an extra large BP cuff and to continue monitoring his BP daily, same arm, same cuff, sitting 5-10 minutes, arm heart level, feet flat on the floor and one hour after he's taken his BP medication and contact our office back next week with an update on his BP readings. Patient advised to get compression stockings for the leg edema, take tylenol for headache. Samples of eliquis given to patient per his request. Verbalized understanding.

## 2018-09-26 NOTE — Patient Instructions (Signed)
Continue same plan Get compression stockings Take tylenol for headache as needed. Call next week with BP readings.

## 2018-09-26 NOTE — Telephone Encounter (Signed)
Patient concerned about his BP being elevated. BP has been ranging 160-170/86-90 & 172/89 a few minutes prior to calling office. Has been on inspra for 3 days. Also c/o swelling in legs and has been taking furosemide for the past 2 days and said it has helped some. Says he is able to put his shoes and socks on. Patient advised that it takes longer to get full effect of BP medications with recent changes. Patient advise to come to office today for a nurse BP check and to bring home BP monitor along with his medications. Verbalized understanding.

## 2018-09-26 NOTE — Telephone Encounter (Signed)
Patient called stating that he continues to retain fluid . States that his BP continues to run high. Please call [phone]

## 2018-09-27 ENCOUNTER — Telehealth: Payer: Self-pay | Admitting: *Deleted

## 2018-09-27 NOTE — Progress Notes (Signed)
Would take several days for eplerenone to take effect, can he update [place] early next week on his bp's. Bp's in the 160s to 170s short term is not something dangerous, we will continue to work to bring it down   Dominga Ferry MD

## 2018-09-27 NOTE — Telephone Encounter (Signed)
-----   Message from Antoine Poche, MD sent at 09/27/2018  9:09 AM EST -----   ----- Message ----- From: Eustace Moore, RN Sent: 09/26/2018   3:00 PM EST To: Antoine Poche, MD

## 2018-09-27 NOTE — Telephone Encounter (Signed)
Patient informed of Dr. Verna Czech response and verbalized understanding of plan.

## 2018-10-03 ENCOUNTER — Telehealth: Payer: Self-pay | Admitting: Cardiology

## 2018-10-03 MED ORDER — EPLERENONE 25 MG PO TABS: 50.0000 mg | ORAL_TABLET | Freq: Every day | ORAL | Status: DC

## 2018-10-03 NOTE — Telephone Encounter (Signed)
Walk in   Patient complaining about legs swelling. Stated that they are itching and burning

## 2018-10-03 NOTE — Telephone Encounter (Signed)
Patient notified and verbalized understanding. 

## 2018-10-03 NOTE — Telephone Encounter (Signed)
Clarified orders with Dr. Purvis Sheffield - increase Eplerenone to 50mg  daily.

## 2018-10-03 NOTE — Telephone Encounter (Signed)
No weight gain.  Swelling not as bad as the itching.  Has noticed that his back is hurting so not sure if this could be from his back.  Took some Lasix and that has helped.  No c/o chest pain or dizziness, no SOB more than usual.  Concerned about his BP readings.  Does go to gym 3 days a week and bp was lower after going to exercise.  Will give provider list of readings for review.

## 2018-10-03 NOTE — Telephone Encounter (Signed)
Try increasing eplerenone as BP's remain elevated (I reviewed log).

## 2018-10-07 ENCOUNTER — Telehealth: Payer: Self-pay | Admitting: *Deleted

## 2018-10-07 NOTE — Telephone Encounter (Signed)
Pt reporting BP readings for the last 5 days with increase of Inspra 50 mg daily - says SBP running 160s-170s/80s - HR 60s-70s - says he has been going to the gym for an hour for the last 4 days and thinks this has helped BP come down 150s/80s - rechecks again when he gets home and BP goes back into 160s/170s/80s - denies any swelling but does still have some itching on the legs - wants to know if medication should be adjusted further - scheduled pt nursing visit for Monday and will recheck at that time - advised pt he should refrain from taking BP multiple times daily (seems to be anxious when he even thinks about checking BP) pt agreeable and will only take 2 times daily - recent nursing visit pt was to get a new BP cuff as readings were off - will address on Monday - pt denies any symptoms at this time.

## 2018-10-10 ENCOUNTER — Ambulatory Visit (INDEPENDENT_AMBULATORY_CARE_PROVIDER_SITE_OTHER): Payer: Medicare Other | Admitting: *Deleted

## 2018-10-10 ENCOUNTER — Other Ambulatory Visit: Payer: Self-pay | Admitting: *Deleted

## 2018-10-10 DIAGNOSIS — I10 Essential (primary) hypertension: Secondary | ICD-10-CM

## 2018-10-10 MED ORDER — APIXABAN 5 MG PO TABS: 5.0000 mg | ORAL_TABLET | Freq: Two times a day (BID) | ORAL | 0 refills | Status: DC

## 2018-10-10 NOTE — Progress Notes (Signed)
Pt here per recent phone note - BP check - says he doesn't feel well - weak and tired - worked out 5 days last week and says this seemed to make BP worse - taking Inspra 50 mg daily for the last week - says he doesn't eat red meat and has been watching salt intake - has already taken medications this morning - getting labs done at [facility] after leaving here - BP today by [facility] check 152/82 HR 66

## 2018-10-11 LAB — BASIC METABOLIC PANEL
BUN: 16 mg/dL (ref 7–25)
CALCIUM: 10 mg/dL (ref 8.6–10.3)
CHLORIDE: 105 mmol/L (ref 98–110)
CO2: 24 mmol/L (ref 20–32)
Creat: 1.05 mg/dL (ref 0.70–1.25)
GLUCOSE: 102 mg/dL (ref 65–139)
Potassium: 4.7 mmol/L (ref 3.5–5.3)
Sodium: 139 mmol/L (ref 135–146)

## 2018-10-11 NOTE — Progress Notes (Signed)
Eplerenone will take some time to fully kick in, I would not adjust further right now. A bp of 150/80 in the short term is ok and not harmful. Have him update [place] next week with bp's, only check once a day at home in the late afternoon please. Hopefully as his body gets use to meds any side effects may improve as well   Dominga Ferry MD

## 2018-10-11 NOTE — Progress Notes (Signed)
Pt aware and voiced understanding will update us next week 

## 2018-10-14 ENCOUNTER — Telehealth: Payer: Self-pay | Admitting: *Deleted

## 2018-10-14 NOTE — Telephone Encounter (Signed)
-----   Message from Antoine Poche, MD sent at 10/14/2018  3:16 PM EST ----- Normal labs   Dominga Ferry MD

## 2018-10-14 NOTE — Telephone Encounter (Signed)
Pt aware - no pcp  

## 2018-10-21 ENCOUNTER — Telehealth: Payer: Self-pay | Admitting: *Deleted

## 2018-10-21 MED ORDER — PRAZOSIN HCL 1 MG PO CAPS: 1.0000 mg | ORAL_CAPSULE | Freq: Two times a day (BID) | ORAL | 1 refills | Status: DC

## 2018-10-21 MED ORDER — IRBESARTAN 300 MG PO TABS: 300.0000 mg | ORAL_TABLET | Freq: Two times a day (BID) | ORAL | 1 refills | Status: DC

## 2018-10-21 NOTE — Telephone Encounter (Signed)
Left detailed message on personal VM  

## 2018-10-21 NOTE — Telephone Encounter (Signed)
-----   Message from Antoine Poche, MD sent at 10/20/2018  2:33 PM EST ----- BP's log reviwed, above goal overall. Limited options, I would try starting prazosin 1mg  bid and update [place] on his blood pressures next week   Dominga Ferry MD

## 2018-10-21 NOTE — Telephone Encounter (Signed)
Can go back to just prn on his lasix for the time being.    Dominga Ferry MD

## 2018-10-21 NOTE — Telephone Encounter (Signed)
Pt aware - new rx sent to pharmacy - should pt continue to take lasix daily with this medication ?

## 2018-11-01 ENCOUNTER — Other Ambulatory Visit: Payer: Self-pay | Admitting: Cardiology

## 2018-11-01 ENCOUNTER — Telehealth: Payer: Self-pay | Admitting: *Deleted

## 2018-11-01 NOTE — Telephone Encounter (Signed)
Pt says BP has been running in the 150s-160s/80s-90s - has been taking eplerenone, prazosin as directed - exercising at gym several days weekly and watching salt intake

## 2018-11-02 NOTE — Telephone Encounter (Signed)
We have room to titrate the prazosin, can increase to 2mg  bid.   Dominga Ferry MD

## 2018-11-03 ENCOUNTER — Other Ambulatory Visit: Payer: Self-pay | Admitting: *Deleted

## 2018-11-03 MED ORDER — PRAZOSIN HCL 2 MG PO CAPS: 2.0000 mg | ORAL_CAPSULE | Freq: Every day | ORAL | 1 refills | Status: DC

## 2018-11-03 MED ORDER — PRAZOSIN HCL 2 MG PO CAPS: 2.0000 mg | ORAL_CAPSULE | Freq: Two times a day (BID) | ORAL | 1 refills | Status: DC

## 2018-11-03 MED ORDER — EPLERENONE 25 MG PO TABS: 50.0000 mg | ORAL_TABLET | Freq: Every day | ORAL | 1 refills | Status: DC

## 2018-11-03 NOTE — Telephone Encounter (Signed)
Patient informed of dose increase of prazosin and verbalized understanding.

## 2018-11-03 NOTE — Telephone Encounter (Signed)
Wrong directions sent to pharmacy for prazosin 2mg . Corrected directions sent.

## 2018-11-03 NOTE — Telephone Encounter (Signed)
[name] [name] called stating that his blood pressure continues to stay high.  He is requesting to be seen. States that he needs to discuss his medications.

## 2018-11-14 ENCOUNTER — Telehealth: Payer: Self-pay | Admitting: Cardiology

## 2018-11-14 MED ORDER — PRAZOSIN HCL 2 MG PO CAPS: 4.0000 mg | ORAL_CAPSULE | Freq: Two times a day (BID) | ORAL | Status: DC

## 2018-11-14 NOTE — Telephone Encounter (Signed)
BP high and  Headaches  And occasional chest pain with exertion.  Currently not having chest pain at time of called.   He is wanting to see Dr Guy Sandifer an EKG. 160/86  Last night

## 2018-11-14 NOTE — Telephone Encounter (Signed)
Earliest I can see him in Wed at 2pm as I am seeing patients in the hospital all week. BP's in 160s typically would not cause symptoms, may be something else going on causing the headaches and chest pain. If symptoms severe needs ER evaluation. If not would see him Wed at 2, have him increase his prazosin to 4mg  bid.   Dominga Ferry MD

## 2018-11-14 NOTE — Telephone Encounter (Signed)
Patient calling back checking on status °

## 2018-11-14 NOTE — Telephone Encounter (Signed)
Patient notified & verbalized understanding.  OV scheduled for 11/16/18 at 2:00 in [place] office.

## 2018-11-16 ENCOUNTER — Ambulatory Visit (INDEPENDENT_AMBULATORY_CARE_PROVIDER_SITE_OTHER): Payer: Medicare Other | Admitting: Cardiology

## 2018-11-16 ENCOUNTER — Encounter: Payer: Self-pay | Admitting: Cardiology

## 2018-11-16 VITALS — BP 158/72 | HR 68 | Ht 66.0 in | Wt 246.0 lb

## 2018-11-16 DIAGNOSIS — I10 Essential (primary) hypertension: Secondary | ICD-10-CM

## 2018-11-16 DIAGNOSIS — I2510 Atherosclerotic heart disease of native coronary artery without angina pectoris: Secondary | ICD-10-CM

## 2018-11-16 MED ORDER — CHLORTHALIDONE 25 MG PO TABS: 12.5000 mg | ORAL_TABLET | Freq: Every day | ORAL | 1 refills | Status: DC

## 2018-11-16 MED ORDER — PRAZOSIN HCL 1 MG PO CAPS: 1.0000 mg | ORAL_CAPSULE | Freq: Two times a day (BID) | ORAL | 1 refills | Status: DC

## 2018-11-16 MED ORDER — APIXABAN 5 MG PO TABS: 5.0000 mg | ORAL_TABLET | Freq: Two times a day (BID) | ORAL | 0 refills | Status: DC

## 2018-11-16 NOTE — Patient Instructions (Addendum)
Your physician recommends that you schedule a follow-up appointment in: TO BE DETERMINED WITH DR [facility]  Your physician has recommended you make the following change in your medication:   START CHLORTHALIDONE 12.5 MG DAILY (1/2 TABLET)  HOLD LASIX FOR 1 WEEK  DECREASE PRAZOSIN 1 MG TWICE DAILY  CHECK BLOOD PRESSURE 1 TIME DAILY FOR 1 WEEK AND CALL us WITH READINGS  Thank you for choosing [facility]!!

## 2018-11-16 NOTE — Progress Notes (Signed)
Clinical Summary [name] is a 68 y.o.male seen today for follow up of the followig medical problems. This is a focused visit on his history of HTN and also CAD with recent chest pain.   1. HTN   - bp's in clinic typically elevated, suspect some white coat HTN - he checks at home regularly. Had been 120s-140s until he had to stop chlorthalidone 37.5mg  daily for gout flare . Since that time bp's have been difficult to control, has had some reported symptoms - currently on dilt 360, eplerenone 50mg  (gynecomastic on aldactone), irbesartan 300. Headaches on hydralazine, stopped. Started on prazosin due to lack of other options.    - can have some dizzines standing. Can feel dizzy with bending over or turning fast. Seems to have really developed on prazosin.  - taking lasix 20mg  qod. Still LE edema legs.      2. CAD  - [facility]  - apparently had follow up cath in 2006 from [place] with patent grafts. Reports negative stress test in 2011 by Dr Hyacinth MeekerMiller in [place]. 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 Chronic neck pain, left arm tingling.     3. OSA  - compliant with CPAP - followed by Dr Vassie LollAlva.      Past Medical History:  Diagnosis Date  . Anxiety   . Coronary artery disease    2v CABG, 2004 [facility]  . DJD (degenerative joint disease)   . HLD (hyperlipidemia)   . Hypertension   . MI, old 2004  . OA (osteoarthritis)   . Obesity   . PAF (paroxysmal atrial fibrillation) (HCC)    a. started on sotalol 08/2016.  Marland Kitchen. Sleep apnea    on CPAP  . Spinal stenosis   . Typical atrial flutter (HCC)      Allergies  Allergen Reactions  . Aldactone [Spironolactone]     GYNECOMASTIA   . Chlorthalidone    GOUT  . Nsaids Other (See Comments)    On blood thinner  . Vancomycin Other (See Comments)    Was informed after heart surgery ? Breathing problem/patient unsure     Current Outpatient Medications  Medication Sig Dispense Refill  . acetaminophen (TYLENOL) 650 MG CR tablet Take 650 mg by mouth every 8 (eight) hours as needed for pain.    Marland Kitchen. apixaban (ELIQUIS) 5 MG TABS tablet Take 1 tablet (5 mg total) by mouth 2 (two) times daily. 42 tablet 0  . atorvastatin (LIPITOR) 20 MG tablet Take 1 tablet (20 mg total) by mouth daily. 90 tablet 1  . Coenzyme Q10 (CO Q 10) 100 MG CAPS Take 400 mg by mouth daily.     Marland Kitchen. diltiazem (CARDIZEM CD) 360 MG 24 hr capsule Take 1 capsule (360 mg total) by mouth daily. 90 capsule 1  . eplerenone (INSPRA) 25 MG tablet Take 2 tablets (50 mg total) by mouth daily. 180 tablet 1  . fish oil-omega-3 fatty acids 1000 MG capsule Take 1 g by mouth 2 (two) times daily.     . furosemide (LASIX) 20 MG tablet Take 1 tablet (20 mg total) by mouth daily as needed (for swelling/weight gain). (Patient taking differently: Take 20 mg by mouth daily. ) 90 tablet  1  . irbesartan (AVAPRO) 300 MG tablet Take 1 tablet (300 mg total) by mouth 2 (two) times daily. 180 tablet 1  . metoprolol tartrate (LOPRESSOR) 25 MG tablet Take 1 tablet (25 mg total) by mouth every 8 (eight) hours as needed (a-fib). 180 tablet 1  . Multiple Vitamin (MULTIVITAMIN) tablet Take 1 tablet by mouth daily.    . nitroGLYCERIN (NITROSTAT) 0.4 MG SL tablet Place 1 tablet (0.4 mg total) under the tongue every 5 (five) minutes x 3 doses as needed for chest pain. 25 tablet 1  . omeprazole (PRILOSEC) 20 MG capsule TAKE 1 CAPSULE BY MOUTH EVERY OTHER DAY. 45 capsule 3  . prazosin (MINIPRESS) 2 MG capsule Take 2 capsules (4 mg total) by mouth 2 (two) times daily.    Marland Kitchen. PRESCRIPTION MEDICATION Pt uses CPAP machine at bedtime    . sertraline (ZOLOFT) 25 MG tablet Take 1 tablet (25 mg total) by mouth daily. 90 tablet 3   No  current facility-administered medications for this visit.      Past Surgical History:  Procedure Laterality Date  . ATRIAL FIBRILLATION ABLATION [name] 05/24/2018   Procedure: ATRIAL FIBRILLATION ABLATION;  Surgeon: Hillis RangeAllred, James, MD;  Location: [facility] INVASIVE CV LAB;  Service: Cardiovascular;  Laterality: [name];  . BACK SURGERY  2011  . CARPAL TUNNEL RELEASE     bilateral  . CORONARY ARTERY BYPASS GRAFT     2004  . ELECTROPHYSIOLOGIC STUDY [name] 09/15/2016   Procedure: Atrial Fibrillation Ablation;  Surgeon: Hillis RangeJames Allred, MD;  [facility];  Service: Cardiovascular;  Laterality: [name];  . fistula surgery    . hemorrhoidectomy    . LEFT HEART CATH AND CORS/GRAFTS ANGIOGRAPHY [name] 02/07/2018   Procedure: LEFT HEART CATH AND CORS/GRAFTS ANGIOGRAPHY;  Surgeon: SwazilandJordan, Peter M, MD;  [facility];  Service: Cardiovascular;  Laterality: [name];  . LUMBAR LAMINECTOMY    . right foot fracture    . TEE WITHOUT CARDIOVERSION [name] 09/15/2016   Procedure: TRANSESOPHAGEAL ECHOCARDIOGRAM (TEE);  Surgeon: Lewayne BuntingBrian S Crenshaw, MD;  [facility];  Service: Cardiovascular;  Laterality: [name];  . TOTAL KNEE ARTHROPLASTY Left 07/07/2017   Procedure: LEFT TOTAL KNEE ARTHROPLASTY;  Surgeon: Ranee GosselinGioffre, Ronald, MD;  Location: [facility] ORS;  Service: Orthopedics;  Laterality: Left;  . TRANSTHORACIC ECHOCARDIOGRAM  08/25/2010   EF 55-60%     Allergies  Allergen Reactions  . Aldactone [Spironolactone]     GYNECOMASTIA   . Chlorthalidone     GOUT  . Nsaids Other (See Comments)    On blood thinner  . Vancomycin Other (See Comments)    Was informed after heart surgery ? Breathing problem/patient unsure      Family History  Problem Relation Age of Onset  . Breast cancer Mother   . Hypertension Mother   . Coronary artery disease Father   . Stroke Father   . Hypertension Father      Social History [name] reports that he has never smoked. He has never used smokeless tobacco. Mr.  [name] [name] reports no history of alcohol use.   Review of Systems CONSTITUTIONAL: No weight loss, fever, chills, weakness or fatigue.  HEENT: Eyes: No visual loss, blurred vision, double vision or yellow sclerae.No hearing loss, sneezing, congestion, runny nose or sore throat.  SKIN: No rash or itching.  CARDIOVASCULAR: per hpi RESPIRATORY: No shortness of breath, cough or sputum.  GASTROINTESTINAL: No anorexia, nausea, vomiting or diarrhea. No abdominal pain or blood.  GENITOURINARY: No burning on  urination, no polyuria NEUROLOGICAL: per hpi MUSCULOSKELETAL: No muscle, back pain, joint pain or stiffness.  LYMPHATICS: No enlarged nodes. No history of splenectomy.  PSYCHIATRIC: No history of depression or anxiety.  ENDOCRINOLOGIC: No reports of sweating, cold or heat intolerance. No polyuria or polydipsia.  Marland Kitchen   Physical Examination Vitals:   11/16/18 1421  BP: (!) 158/72  Pulse: 68  SpO2: 96%   Vitals:   11/16/18 1421  Weight: 246 lb (111.6 kg)  Height: 5\' 6"  (1.676 m)    Gen: resting comfortably, no acute distress HEENT: no scleral icterus, pupils equal round and reactive, no palptable cervical adenopathy,  CV: RRR, no m/r/g no jvd Resp: Clear to auscultation bilaterally GI: abdomen is soft, non-tender, non-distended, normal bowel sounds, no hepatosplenomegaly MSK: extremities are warm, no edema.  Skin: warm, no rash Neuro:  no focal deficits Psych: appropriate affect   Diagnostic Studies 03/2015 echo Study Conclusions  - Left ventricle: The cavity size was normal. Wall thickness was increased in a pattern of mild LVH. Systolic function was normal. The estimated ejection fraction was in the range of 50% to 55%. Wall motion was normal; there were no regional wall motion abnormalities. Doppler parameters are consistent with high ventricular filling pressure. - Left atrium: The atrium was mildly dilated.   03/2015 Lexiscan MPI  No T wave inversion was  noted during stress.  There was no ST segment deviation noted during stress.  The study is normal.  This is a low risk study.  The left ventricular ejection fraction is normal (55-65%).  Nuclear stress EF: 61%.  02/2018 cath  Prox LAD lesion is 100% stenosed.  Prox Cx to Mid Cx lesion is 60% stenosed.  Post Atrio lesion is 100% stenosed.  SVG graft was visualized by angiography and is large.  The graft exhibits minimal luminal irregularities.  LIMA graft was visualized by angiography and is normal in caliber.  The graft exhibits no disease.  The left ventricular systolic function is normal.  LV end diastolic pressure is normal.  The left ventricular ejection fraction is 55-65% by visual estimate.   1. 2 vessel occlusive CAD    - 100% proximal LAD    - 60% mid LCx    - 100% PLOM with left to right collaterals. 2. Patent LIMA to the LAD 3. Patent SVG to the diagonal 4. Normal LV function 5. Normal LVEDP   Assessment and Plan  1. HTN - often with elevated clinic numbers, suspect white coat HTN - home numbers were at goal until had to stop chlorthalidone for gout flare - significant dizzienss and fatigue since starting prazosin. He is borderlien orthostatic with SBP dropping 17 points with standing  - wean prazosin to 1mg  bid, restart chlorthalidone 12.5mg  daily. Really limited options for his bp control     2. CAD - recent atypical MSK pain, chronic history of this - no further cardiac workup - EKG today shows SR, no ischemic changes    He will update [place] on bp's next weak. Would anticipate stopping prasozin and going up on chlorthalidone if toleratesb       Antoine Poche, M.D

## 2018-11-21 ENCOUNTER — Telehealth: Payer: Self-pay | Admitting: Cardiology

## 2018-11-21 NOTE — Telephone Encounter (Signed)
Patient's wife called stating that patient is having problems with his BP

## 2018-11-21 NOTE — Telephone Encounter (Signed)
Had cath 02/2018 cors ok normal EF not clear what symptoms are from can hold prazosin if recurs ok to f/u with Allred Thursday

## 2018-11-21 NOTE — Telephone Encounter (Signed)
While at the gym this morning on bicycle pt felt like he was going to pass out - sat down a few minutes and took BP at gym which was 60/30 had employee check the machine for accuracy and was told it was accurate - pt had to sit for 20-30 mins before he felt like he could drive home - says BP this weekend has been between 150s-180s/60s-70s - HR 60s says he feels weak/sluggish with some occasional chest pain and dull headache - says he has been taking his medications as directed - has f/u this Thursday with Dr Johney Frame - will forward to DOD with Dr Wyline Mood out of office today

## 2018-11-21 NOTE — Telephone Encounter (Signed)
Pt voiced understanding

## 2018-11-24 ENCOUNTER — Encounter: Payer: Self-pay | Admitting: Internal Medicine

## 2018-11-24 ENCOUNTER — Ambulatory Visit (INDEPENDENT_AMBULATORY_CARE_PROVIDER_SITE_OTHER): Payer: Medicare Other | Admitting: Internal Medicine

## 2018-11-24 ENCOUNTER — Telehealth: Payer: Self-pay | Admitting: *Deleted

## 2018-11-24 VITALS — BP 142/74 | HR 69 | Ht 66.0 in | Wt 244.4 lb

## 2018-11-24 DIAGNOSIS — I4819 Other persistent atrial fibrillation: Secondary | ICD-10-CM

## 2018-11-24 DIAGNOSIS — G4733 Obstructive sleep apnea (adult) (pediatric): Secondary | ICD-10-CM | POA: Diagnosis not present

## 2018-11-24 DIAGNOSIS — I2510 Atherosclerotic heart disease of native coronary artery without angina pectoris: Secondary | ICD-10-CM

## 2018-11-24 DIAGNOSIS — I4892 Unspecified atrial flutter: Secondary | ICD-10-CM

## 2018-11-24 DIAGNOSIS — I10 Essential (primary) hypertension: Secondary | ICD-10-CM

## 2018-11-24 MED ORDER — DILTIAZEM HCL ER COATED BEADS 180 MG PO CP24: 180.0000 mg | ORAL_CAPSULE | Freq: Every day | ORAL | 3 refills | Status: DC

## 2018-11-24 NOTE — Patient Instructions (Addendum)
Medication Instructions:  Your physician has recommended you make the following change in your medication:   1.  Reduce your Diltiazem to 180 mg one capsule by mouth daily  Labwork: None ordered.  Testing/Procedures: None ordered.  Follow-Up: Your physician wants you to follow-up in: 6 months with Dr. Johney Frame.   You will receive a reminder letter in the mail two months in advance. If you don't receive a letter, please call our office to schedule the follow-up appointment.   Any Other Special Instructions Will Be Listed Below (If Applicable).  If you need a refill on your cardiac medications before your next appointment, please call your pharmacy.

## 2018-11-24 NOTE — Telephone Encounter (Signed)
Pt had appt with Dr Johney Frame who decrease diltiazem 180 mg daily - pt wants to know if Dr Wyline Mood thinks he should increase chlorthalidone 25 mg daily and decrease eplerenone to once daily - already holding prazosin and says this has helped some   BP has been as follows: HR has remained in the 60s  2/13 - 145/79 2/15 - 158/84 2/16 - 161/85 2/17 - 150/87 2/18 - 145/85 2/19 - 138/87 2/20 - 149/80

## 2018-11-24 NOTE — Progress Notes (Signed)
PCP: Patient, No Pcp Per Primary Cardiologist: Dr Wyline Mood Primary EP: Dr Johney Frame  [name] [name] is a 68 y.o. male who presents today for routine electrophysiology followup.  Since last being seen in our clinic, the patient reports doing reasonably well.  No afib since I saw him last.  He is concerned about labile blood pressure.  He is working on this closely with Dr Wyline Mood.  He has fatigue and dizziness.  Occasional headaches.  Uses CPAP.  Today, he denies symptoms of palpitations, exertional chest pain, shortness of breath,  lower extremity edema, presyncope, or syncope.  The patient is otherwise without complaint today.   Past Medical History:  Diagnosis Date  . Anxiety   . Coronary artery disease    2v CABG, 2004 [facility])  . DJD (degenerative joint disease)   . HLD (hyperlipidemia)   . Hypertension   . MI, old 2004  . OA (osteoarthritis)   . Obesity   . PAF (paroxysmal atrial fibrillation) (HCC)    a. started on sotalol 08/2016.  Marland Kitchen Sleep apnea    on CPAP  . Spinal stenosis   . Typical atrial flutter [facility])    Past Surgical History:  Procedure Laterality Date  . ATRIAL FIBRILLATION ABLATION [name] 05/24/2018   Procedure: ATRIAL FIBRILLATION ABLATION;  Surgeon: Hillis Range, MD;  [facility]: [facility] INV[facility] LAB;  Service: Cardiovascular;  Laterality: [name];  . BACK SURGERY  2011  . CARPAL TUNNEL RELEASE     bilateral  . CORONARY ARTERY BYPASS GRAFT     2004  . ELECTROPHYSIOLOGIC STUDY [name] 09/15/2016   Procedure: Atrial Fibrillation Ablation;  Surgeon: Hillis Range, MD;  Location: [facility] INVASIVE CV LAB;  Service: Cardiovascular;  Laterality: [name];  . fistula surgery    . hemorrhoidectomy    . LEFT HEART CATH AND CORS/GRAFTS ANGIOGRAPHY [name] 02/07/2018   Procedure: LEFT HEART CATH AND CORS/GRAFTS ANGIOGRAPHY;  Surgeon: [name], Peter M, MD;  Location: [facility] INVASIVE CV LAB;  Service: Cardiovascular;  Laterality: [name];  . LUMBAR LAMINECTOMY    . right foot fracture    . TEE WITHOUT CARDIOVERSION  [name] 09/15/2016   Procedure: TRANSESOPHAGEAL ECHOCARDIOGRAM (TEE);  Surgeon: Lewayne Bunting, MD;  Location: [facility] ENDOSCOPY;  Service: Cardiovascular;  Laterality: [name];  . TOTAL KNEE ARTHROPLASTY Left 07/07/2017   Procedure: LEFT TOTAL KNEE ARTHROPLASTY;  Surgeon: Ranee Gosselin, MD;  Location: [facility] ORS;  Service: Orthopedics;  Laterality: Left;  . TRANSTHORACIC ECHOCARDIOGRAM  08/25/2010   EF 55-60%    ROS- all systems are reviewed and negatives except as per HPI above  Current Outpatient Medications  Medication Sig Dispense Refill  . acetaminophen (TYLENOL) 650 MG CR tablet Take 650 mg by mouth every 8 (eight) hours as needed for pain.    Marland Kitchen apixaban (ELIQUIS) 5 MG TABS tablet Take 1 tablet (5 mg total) by mouth 2 (two) times daily. 42 tablet 0  . atorvastatin (LIPITOR) 20 MG tablet Take 1 tablet (20 mg total) by mouth daily. 90 tablet 1  . chlorthalidone (HYGROTON) 25 MG tablet Take 0.5 tablets (12.5 mg total) by mouth daily. 45 tablet 1  . Coenzyme Q10 (CO Q 10) 100 MG CAPS Take 400 mg by mouth daily.     Marland Kitchen diltiazem (CARDIZEM CD) 360 MG 24 hr capsule Take 1 capsule (360 mg total) by mouth daily. 90 capsule 1  . eplerenone (INSPRA) 25 MG tablet Take 2 tablets (50 mg total) by mouth daily. 180 tablet 1  . fish oil-omega-3 fatty acids 1000  MG capsule Take 1 g by mouth 2 (two) times daily.     . furosemide (LASIX) 20 MG tablet Take 1 tablet (20 mg total) by mouth daily as needed (for swelling/weight gain). (Patient taking differently: Take 20 mg by mouth daily. ) 90 tablet 1  . irbesartan (AVAPRO) 300 MG tablet Take 300 mg by mouth daily.    . metoprolol tartrate (LOPRESSOR) 25 MG tablet Take 1 tablet (25 mg total) by mouth every 8 (eight) hours as needed (a-fib). 180 tablet 1  . Multiple Vitamin (MULTIVITAMIN) tablet Take 1 tablet by mouth daily.    . nitroGLYCERIN (NITROSTAT) 0.4 MG SL tablet Place 1 tablet (0.4 mg total) under the tongue every 5 (five) minutes x 3 doses as needed for chest  pain. 25 tablet 1  . omeprazole (PRILOSEC) 20 MG capsule TAKE 1 CAPSULE BY MOUTH EVERY OTHER DAY. 45 capsule 3  . prazosin (MINIPRESS) 1 MG capsule Take 1 capsule (1 mg total) by mouth 2 (two) times daily. 60 capsule 1  . PRESCRIPTION MEDICATION Pt uses CPAP machine at bedtime    . sertraline (ZOLOFT) 25 MG tablet Take 1 tablet (25 mg total) by mouth daily. 90 tablet 3   No current facility-administered medications for this visit.     Physical Exam: Vitals:   11/24/18 0952  BP: (!) 142/74  Pulse: 69  SpO2: 98%  Weight: 244 lb 6.4 oz (110.9 kg)  Height: 5\' 6"  (1.676 m)    GEN- The patient is well appearing, alert and oriented x 3 today.   Head- normocephalic, atraumatic Eyes-  Sclera clear, conjunctiva pink Ears- hearing intact Oropharynx- clear Lungs- Clear to ausculation bilaterally, normal work of breathing Heart- Regular rate and rhythm, no murmurs, rubs or gallops, PMI not laterally displaced GI- soft, NT, ND, + BS Extremities- no clubbing, cyanosis, or edema  Wt Readings from Last 3 Encounters:  11/24/18 244 lb 6.4 oz (110.9 kg)  11/16/18 246 lb (111.6 kg)  09/26/18 249 lb (112.9 kg)    Assessment and Plan:  1. Persistent afib/ atrial flutter Doing very well s/p ablation chads2vasc score is 2.  He is on eliquis Reduce diltiazem to 180mg  daily  2. OSA Compliant with CPAP  3. Obesity Body mass index is 39.45 kg/m. Lifestyle modification encouraged  4. HTN Labile We discussed at length today I have reduced diltiazem to see if fatigue/ headaches improve He could consider increasing chlorthalidone or adding low dose amlodipine.  I will defer to Dr Wyline Mood  Return in 6 months to see me Follow-up with Dr Wyline Mood for BP control in the interim.  Hillis Range MD, [facility] 11/24/2018 10:22 AM

## 2018-11-25 MED ORDER — CHLORTHALIDONE 25 MG PO TABS: 25.0000 mg | ORAL_TABLET | Freq: Every day | ORAL | 1 refills | Status: DC

## 2018-11-25 MED ORDER — EPLERENONE 25 MG PO TABS: 25.0000 mg | ORAL_TABLET | Freq: Every day | ORAL | 1 refills | Status: DC

## 2018-11-25 NOTE — Telephone Encounter (Signed)
Patient informed and verbalized understanding of plan. 

## 2018-11-25 NOTE — Telephone Encounter (Signed)
Yes, lets increase chlorthalidone to 25mg  daily and can lower eplerenone to once daily   J Nyomi Howser MD

## 2018-12-08 ENCOUNTER — Telehealth: Payer: Self-pay | Admitting: *Deleted

## 2018-12-08 ENCOUNTER — Other Ambulatory Visit: Payer: Self-pay | Admitting: Cardiology

## 2018-12-08 MED ORDER — SERTRALINE HCL 25 MG PO TABS: 25.0000 mg | ORAL_TABLET | Freq: Every day | ORAL | 1 refills | Status: DC

## 2018-12-08 NOTE — Telephone Encounter (Signed)
Pt says BP is running much better 120s-130s/60s-70s - feeling much better - when did you want to f/u ?

## 2018-12-09 NOTE — Telephone Encounter (Signed)
So glad to hear, 3 months f/u please  JBranch MD

## 2018-12-09 NOTE — Telephone Encounter (Signed)
F/u scheduled 

## 2018-12-12 ENCOUNTER — Telehealth: Payer: Self-pay | Admitting: *Deleted

## 2018-12-12 NOTE — Telephone Encounter (Signed)
Pt says for the last week has felt palpitations and "skipping heart beats every few beat" wanted to know if he needed EKG to make sure he wasn't in afib - checks HR at home and has been between 70-90

## 2018-12-13 NOTE — Telephone Encounter (Signed)
Pt will come in tomorrow for EKG possible holter - added to schedule

## 2018-12-13 NOTE — Telephone Encounter (Signed)
Can come in for an ekg, if normal then would plan for a 48 hr holter   Dominga Ferry MD

## 2018-12-13 NOTE — Telephone Encounter (Signed)
Daily for the last 2 weeks

## 2018-12-13 NOTE — Telephone Encounter (Signed)
Better luck catching something if he wore a monitor. How often are the symptoms happening?   Dominga Ferry MD

## 2018-12-14 ENCOUNTER — Ambulatory Visit (INDEPENDENT_AMBULATORY_CARE_PROVIDER_SITE_OTHER): Payer: Medicare Other | Admitting: *Deleted

## 2018-12-14 ENCOUNTER — Other Ambulatory Visit: Payer: Self-pay

## 2018-12-14 DIAGNOSIS — R002 Palpitations: Secondary | ICD-10-CM | POA: Diagnosis not present

## 2018-12-14 MED ORDER — APIXABAN 5 MG PO TABS: 5.0000 mg | ORAL_TABLET | Freq: Two times a day (BID) | ORAL | 0 refills | Status: DC

## 2018-12-14 NOTE — Progress Notes (Signed)
We will see what the monitor shows. EKG with lots of artifact, I suspect back in afib. Monitor will confirm and also show how much afib and what heart rates are    J Shamya Macfadden MD

## 2018-12-14 NOTE — Progress Notes (Signed)
Monitor enrolled and pt will send back in 2 days

## 2018-12-14 NOTE — Addendum Note (Signed)
Addended by: Burman Nieves T on: 12/14/2018 09:43 AM   Modules accepted: Orders

## 2018-12-14 NOTE — Progress Notes (Addendum)
Pt here for EKG per yesterday's phone note - will forward to Dr Wyline Mood and 48 hour holter monitor placed - pt c/o palpitations every few minutes - has been taking metoprolol every other day or so for symptoms

## 2018-12-26 ENCOUNTER — Telehealth: Payer: Self-pay | Admitting: Cardiology

## 2018-12-26 ENCOUNTER — Other Ambulatory Visit: Payer: Self-pay | Admitting: Cardiology

## 2018-12-26 NOTE — Telephone Encounter (Signed)
Forwarded to Dr Wyline Mood - monitor results scanned into chart

## 2018-12-26 NOTE — Telephone Encounter (Signed)
Patient called asking for test results.

## 2018-12-27 NOTE — Telephone Encounter (Signed)
Monitor looks good. He has some occasoinal extra heart beats which are benign but can cause some symptoms, there is no significant afib noted. Looking back at his EKG it also looks like normal rhythm with just some occasional extra heart beats. Overall benign findings, how are his symptoms doing   Dominga Ferry MD

## 2018-12-27 NOTE — Telephone Encounter (Signed)
Pt voiced understanding - denies any symptoms at this time - has been staying home and only checking BP once daily and has been running 120s/60s-70s - appreciative of call

## 2019-01-06 ENCOUNTER — Other Ambulatory Visit: Payer: Self-pay | Admitting: Cardiology

## 2019-01-18 ENCOUNTER — Telehealth: Payer: Self-pay | Admitting: Cardiology

## 2019-01-18 NOTE — Telephone Encounter (Signed)
Patient called stating that he thinks he has gout from medication of eplerenone (INSPRA) 25 MG tablet . He is wanting to know if he can take another medication.   [facility]   [place], [place]

## 2019-01-18 NOTE — Telephone Encounter (Signed)
Pt agreeable to change chlorthalidone every other day. Will continue to monitor BP. Also gave info on Evisits with  and that would be an option for treatment of gout. Gave pt web site info and where to find e visits. Pt appreciative and will update [place] with symptoms.

## 2019-01-18 NOTE — Telephone Encounter (Signed)
The gout is more likely from the chlorthalidone. That was the medicine he had stopped previously due to gout however his bp went out of control and we were not able to control it until we restarted it. Can try taking chlorthalidone 25mg  every other day to see if helps, may have some higher bp's over that. As far as the gout that is not something we would treat, he may need to be evaluated by urgent care. I believe [facility] offers tele visits as well for primary care issues, do we have any info on that to share with him    Dominga Ferry MD

## 2019-01-18 NOTE — Telephone Encounter (Signed)
Pt says over the last few weeks has developed gout in his foot and hip that has gotten worse. Was told this was a common side affect of Inspra - wanted to know if he should cut this in half for take every other day or try another medication that he has taken before. Also asking if he needs gout medicine (hasn't established with pcp at this time) pt say BP has remained 120s/60s.

## 2019-02-03 ENCOUNTER — Other Ambulatory Visit: Payer: Self-pay | Admitting: *Deleted

## 2019-02-03 ENCOUNTER — Telehealth: Payer: Self-pay | Admitting: Cardiology

## 2019-02-03 MED ORDER — APIXABAN 5 MG PO TABS: 5.0000 mg | ORAL_TABLET | Freq: Two times a day (BID) | ORAL | 0 refills | Status: DC

## 2019-02-03 NOTE — Telephone Encounter (Signed)
Pt says he stopped chlorthalidone week ago and this relieved gout symptoms - started hydralazine 50 mg tid when he stopped chlorthalidone. BP has been 126/70 - 130/70. Says he is feeling great. Updated medication list. Pt requested samples of Eliquis - will contact in office clinic staff to have ready (pt will pick up Monday and call when he is in the parking lot so staff can meet him at the door)

## 2019-02-03 NOTE — Telephone Encounter (Signed)
Patient called stating that he was returning a call to Staci.  

## 2019-03-16 ENCOUNTER — Telehealth: Payer: Self-pay | Admitting: Cardiology

## 2019-03-16 NOTE — Telephone Encounter (Signed)
° ° °  COVID-19 Pre-Screening Questions: ° °• In the past 7 to 10 days have you had a cough,  shortness of breath, headache, congestion, fever (100 or greater) body aches, chills, sore throat, or sudden loss of taste or sense of smell? No °• Have you been around anyone with known Covid 19.no °•  °• Have you been around anyone who is awaiting Covid 19 test results in the past 7 to 10 days? No °• Have you been around anyone who has been exposed to Covid 19, or has mentioned symptoms of Covid 19 within the past 7 to 10 days?  No °•  ° °If you have any concerns/questions about symptoms patients report during screening (either on the phone or at threshold). Contact the provider seeing the patient or DOD for further guidance.  If neither are available contact a member of the leadership team. ° ° ° °   ° ° ° ° °

## 2019-03-17 ENCOUNTER — Other Ambulatory Visit: Payer: Self-pay

## 2019-03-17 ENCOUNTER — Ambulatory Visit (INDEPENDENT_AMBULATORY_CARE_PROVIDER_SITE_OTHER): Payer: Medicare Other | Admitting: Cardiology

## 2019-03-17 ENCOUNTER — Encounter: Payer: Self-pay | Admitting: Cardiology

## 2019-03-17 VITALS — BP 172/76 | HR 52 | Temp 98.2°F | Ht 67.0 in | Wt 245.0 lb

## 2019-03-17 DIAGNOSIS — I4891 Unspecified atrial fibrillation: Secondary | ICD-10-CM

## 2019-03-17 DIAGNOSIS — I10 Essential (primary) hypertension: Secondary | ICD-10-CM | POA: Diagnosis not present

## 2019-03-17 DIAGNOSIS — I2510 Atherosclerotic heart disease of native coronary artery without angina pectoris: Secondary | ICD-10-CM | POA: Diagnosis not present

## 2019-03-17 MED ORDER — METOPROLOL TARTRATE 25 MG PO TABS: 25.0000 mg | ORAL_TABLET | Freq: Three times a day (TID) | ORAL | 1 refills | Status: DC | PRN

## 2019-03-17 MED ORDER — APIXABAN 5 MG PO TABS: 5.0000 mg | ORAL_TABLET | Freq: Two times a day (BID) | ORAL | 6 refills | Status: DC

## 2019-03-17 MED ORDER — EPLERENONE 25 MG PO TABS: 25.0000 mg | ORAL_TABLET | Freq: Two times a day (BID) | ORAL | 1 refills | Status: DC

## 2019-03-17 MED ORDER — DILTIAZEM HCL ER COATED BEADS 180 MG PO CP24: 180.0000 mg | ORAL_CAPSULE | Freq: Every day | ORAL | 3 refills | Status: DC

## 2019-03-17 MED ORDER — ATORVASTATIN CALCIUM 20 MG PO TABS: 20.0000 mg | ORAL_TABLET | Freq: Every day | ORAL | 1 refills | Status: DC

## 2019-03-17 MED ORDER — FUROSEMIDE 20 MG PO TABS: 20.0000 mg | ORAL_TABLET | Freq: Every day | ORAL | 1 refills | Status: DC | PRN

## 2019-03-17 MED ORDER — APIXABAN 5 MG PO TABS: 5.0000 mg | ORAL_TABLET | Freq: Two times a day (BID) | ORAL | 0 refills | Status: DC

## 2019-03-17 MED ORDER — IRBESARTAN 300 MG PO TABS: 300.0000 mg | ORAL_TABLET | Freq: Every day | ORAL | 1 refills | Status: DC

## 2019-03-17 NOTE — Progress Notes (Signed)
Clinical Summary [name] [name] is a 68 y.o.male seen today for follow up of the following medical problems.   1. HTN   - bp's in clinic typically elevated, suspect some white coat HTN - he checks at home regularly. Had been 120s-140s until he had to stop chlorthalidone 37.5mg  daily for gout flare . Since that time bp's have been difficult to control, has had some reported symptoms - currently on dilt 360, eplerenone 50mg  (gynecomastia on aldactone), irbesartan 300. Headaches on hydralazine, stopped. Started on prazosin due to lack of other options, stopped due to severe dizziness. Dilt prevoiusly lowered from 360 to 180 for possible fatigue and headaches. Off prazosin due to dizziness  - since last visit he has stopped his chlothalidone again due to gout exacerbation, he increased his hydralazine to 50mg  tid and is tolerating.   - home bp's running around 140s/80s, at times 120s/70s    2. CAD  - prior 2 vessel CABG in 2004 at [facility]  - apparently had follow up cath in 2006 from [place] with patent grafts. Reports negative stress test in 2011 by Dr Hyacinth Meeker in [place]. 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 OSA  - compliant with CPAP - followed by Dr Vassie Loll.    3. Hyperlipidemia  - noted some side effects on higher lipitor doses, has tolerated lower dose well  4. OSA  - compliant with CPAP  5. Afib - s/p ablation. He had failed rate control as well as multiple antiarrhythmics - no recent palpitations. No bleeding on eliquis  - recurrent afib 02/2018 - 05/2018 repeat ablation.   -no recent palpitations        Past Medical History:  Diagnosis Date  . Anxiety   . Coronary artery disease    2v CABG, 2004 [facility])   . DJD (degenerative joint disease)   . HLD (hyperlipidemia)   . Hypertension   . MI, old 2004  . OA (osteoarthritis)   . Obesity   . PAF (paroxysmal atrial fibrillation) (HCC)    a. started on sotalol 08/2016.  Marland Kitchen Sleep apnea    on CPAP  . Spinal stenosis   . Typical atrial flutter (HCC)      Allergies  Allergen Reactions  . Aldactone [Spironolactone]     GYNECOMASTIA   . Chlorthalidone     GOUT  . Nsaids Other (See Comments)    On blood thinner  . Vancomycin Other (See Comments)    Was informed after heart surgery ? Breathing problem/patient unsure     Current Outpatient Medications  Medication Sig Dispense Refill  . acetaminophen (TYLENOL) 650 MG CR tablet Take 650 mg by mouth every 8 (eight) hours as needed for pain.    Marland Kitchen apixaban (ELIQUIS) 5 MG TABS tablet Take 1 tablet (5 mg total) by mouth 2 (two) times daily. 112 tablet 0  . atorvastatin (LIPITOR) 20 MG tablet Take 1 tablet (20 mg total) by mouth daily. 90 tablet 1  . Coenzyme Q10 (CO Q 10) 100 MG CAPS Take 400 mg by mouth daily.     Marland Kitchen diltiazem (CARDIZEM CD) 180 MG 24 hr capsule Take 1 capsule (180 mg total) by mouth daily. 90 capsule 3  . eplerenone (INSPRA) 25 MG  tablet Take 1 tablet (25 mg total) by mouth daily. 90 tablet 1  . fish oil-omega-3 fatty acids 1000 MG capsule Take 1 g by mouth 2 (two) times daily.     . furosemide (LASIX) 20 MG tablet TAKE 1 TABLET (20 MG TOTAL) BY MOUTH DAILY AS NEEDED (FOR SWELLING/WEIGHT GAIN). 90 tablet 1  . hydrALAZINE (APRESOLINE) 50 MG tablet Take 50 mg by mouth 3 (three) times daily.    . irbesartan (AVAPRO) 300 MG tablet Take 300 mg by mouth daily.    . metoprolol tartrate (LOPRESSOR) 25 MG tablet TAKE 1 TABLET (25 MG TOTAL) BY MOUTH EVERY 8 (EIGHT) HOURS AS NEEDED (A-FIB). 180 tablet 1  . Multiple Vitamin (MULTIVITAMIN) tablet Take 1 tablet by mouth daily.    . nitroGLYCERIN (NITROSTAT) 0.4 MG SL tablet Place 1 tablet (0.4 mg total) under the tongue every 5 (five) minutes x  3 doses as needed for chest pain. 25 tablet 1  . omeprazole (PRILOSEC) 20 MG capsule TAKE 1 CAPSULE BY MOUTH EVERY OTHER DAY. 45 capsule 3  . prazosin (MINIPRESS) 1 MG capsule Take 1 capsule (1 mg total) by mouth 2 (two) times daily. 60 capsule 1  . PRESCRIPTION MEDICATION Pt uses CPAP machine at bedtime    . sertraline (ZOLOFT) 25 MG tablet Take 1 tablet (25 mg total) by mouth daily. 90 tablet 1   No current facility-administered medications for this visit.      Past Surgical History:  Procedure Laterality Date  . ATRIAL FIBRILLATION ABLATION [name] 05/24/2018   Procedure: ATRIAL FIBRILLATION ABLATION;  Surgeon: Hillis RangeAllred, James, MD;  Location: [facility] INVASIVE CV LAB;  Service: Cardiovascular;  Laterality: [name];  . BACK SURGERY  2011  . CARPAL TUNNEL RELEASE     bilateral  . CORONARY ARTERY BYPASS GRAFT     2004  . ELECTROPHYSIOLOGIC STUDY [name] 09/15/2016   Procedure: Atrial Fibrillation Ablation;  Surgeon: Hillis RangeJames Allred, MD;  [facility];  Service: Cardiovascular;  Laterality: [name];  . fistula surgery    . hemorrhoidectomy    . LEFT HEART CATH AND CORS/GRAFTS ANGIOGRAPHY [name] 02/07/2018   Procedure: LEFT HEART CATH AND CORS/GRAFTS ANGIOGRAPHY;  Surgeon: SwazilandJordan, Peter M, MD;  [facility];  Service: Cardiovascular;  Laterality: [name];  . LUMBAR LAMINECTOMY    . right foot fracture    . TEE WITHOUT CARDIOVERSION [name] 09/15/2016   Procedure: TRANSESOPHAGEAL ECHOCARDIOGRAM (TEE);  Surgeon: Lewayne BuntingBrian S Crenshaw, MD;  [facility];  Service: Cardiovascular;  Laterality: [name];  . TOTAL KNEE ARTHROPLASTY Left 07/07/2017   Procedure: LEFT TOTAL KNEE ARTHROPLASTY;  Surgeon: Ranee GosselinGioffre, Ronald, MD;  Location: [facility] ORS;  Service: Orthopedics;  Laterality: Left;  . TRANSTHORACIC ECHOCARDIOGRAM  08/25/2010   EF 55-60%     Allergies  Allergen Reactions  . Aldactone [Spironolactone]     GYNECOMASTIA   . Chlorthalidone     GOUT  . Nsaids Other (See Comments)    On blood thinner   . Vancomycin Other (See Comments)    Was informed after heart surgery ? Breathing problem/patient unsure      Family History  Problem Relation Age of Onset  . Breast cancer Mother   . Hypertension Mother   . Coronary artery disease Father   . Stroke Father   . Hypertension Father      Social History [name] [name] reports that he has never smoked. He has never used smokeless tobacco. [name] [name] reports no history of alcohol use.   Review  of Systems CONSTITUTIONAL: No weight loss, fever, chills, weakness or fatigue.  HEENT: Eyes: No visual loss, blurred vision, double vision or yellow sclerae.No hearing loss, sneezing, congestion, runny nose or sore throat.  SKIN: No rash or itching.  CARDIOVASCULAR: per hpi RESPIRATORY: No shortness of breath, cough or sputum.  GASTROINTESTINAL: No anorexia, nausea, vomiting or diarrhea. No abdominal pain or blood.  GENITOURINARY: No burning on urination, no polyuria NEUROLOGICAL: No headache, dizziness, syncope, paralysis, ataxia, numbness or tingling in the extremities. No change in bowel or bladder control.  MUSCULOSKELETAL: No muscle, back pain, joint pain or stiffness.  LYMPHATICS: No enlarged nodes. No history of splenectomy.  PSYCHIATRIC: No history of depression or anxiety.  ENDOCRINOLOGIC: No reports of sweating, cold or heat intolerance. No polyuria or polydipsia.  Marland Kitchen   Physical Examination Today's Vitals   03/17/19 1305  BP: (!) 172/76  Pulse: (!) 52  Temp: 98.2 F (36.8 C)  TempSrc: Oral  SpO2: 98%  Weight: 245 lb (111.1 kg)  Height: 5\' 7"  (1.702 m)   Body mass index is 38.37 kg/m.   Gen: resting comfortably, no acute distress HEENT: no scleral icterus, pupils equal round and reactive, no palptable cervical adenopathy,  CV: RRR no m/r/g, no jvd Resp: Clear to auscultation bilaterally GI: abdomen is soft, non-tender, non-distended, normal bowel sounds, no hepatosplenomegaly MSK: extremities are warm, no edema.   Skin: warm, no rash Neuro:  no focal deficits Psych: appropriate affect   Diagnostic Studies 03/2015 echo Study Conclusions  - Left ventricle: The cavity size was normal. Wall thickness was increased in a pattern of mild LVH. Systolic function was normal. The estimated ejection fraction was in the range of 50% to 55%. Wall motion was normal; there were no regional wall motion abnormalities. Doppler parameters are consistent with high ventricular filling pressure. - Left atrium: The atrium was mildly dilated.   03/2015 Lexiscan MPI  No T wave inversion was noted during stress.  There was no ST segment deviation noted during stress.  The study is normal.  This is a low risk study.  The left ventricular ejection fraction is normal (55-65%).  Nuclear stress EF: 61%.  02/2018 cath  Prox LAD lesion is 100% stenosed.  Prox Cx to Mid Cx lesion is 60% stenosed.  Post Atrio lesion is 100% stenosed.  SVG graft was visualized by angiography and is large.  The graft exhibits minimal luminal irregularities.  LIMA graft was visualized by angiography and is normal in caliber.  The graft exhibits no disease.  The left ventricular systolic function is normal.  LV end diastolic pressure is normal.  The left ventricular ejection fraction is 55-65% by visual estimate.  1. 2 vessel occlusive CAD - 100% proximal LAD - 60% mid LCx - 100% PLOM with left to right collaterals. 2. Patent LIMA to the LAD 3. Patent SVG to the diagonal 4. Normal LV function 5. Normal LVEDP   12/2018 heart monitor  48 hr holter monitor  Min HR 46, Max HR 106, Avg HR 62  Rare ventricular ectopy, all in the form of isolated PVCs  Occasional supraventricular ectopy, primarily as isolated PACs. One 3 beat run of atach.  No significant arrhythmias    Assessment and Plan  1. HTN - often with elevated clinic numbers, suspect white coat HTN - very difficult management due  to requiring multiple agents and multiple medication side effects - reasonable control based on his home numbers, as good as we can get given limitations from side  effects  - continue current meds     2. CAD -no recent symptoms, continue current meds    3. Afib -no significant symptoms. Recent monitor showed only benign ectopy, no recurrent afib - continue current meds    F/u 6 months. Would get annual labs at that time    [name] [name], M.D.

## 2019-03-17 NOTE — Patient Instructions (Addendum)

## 2019-03-19 ENCOUNTER — Other Ambulatory Visit: Payer: Self-pay | Admitting: Cardiology

## 2019-04-20 ENCOUNTER — Telehealth: Payer: Self-pay | Admitting: Cardiology

## 2019-04-20 NOTE — Telephone Encounter (Signed)
I do not have any lab results to indicate what his blood type is.I informed him of this.

## 2019-04-20 NOTE — Telephone Encounter (Signed)
Patient asking what his blood type is.

## 2019-05-23 ENCOUNTER — Telehealth: Payer: Self-pay | Admitting: *Deleted

## 2019-05-23 NOTE — Telephone Encounter (Signed)
Pt in donut hole and requesting samples of Eliquis - pt will come by tomorrow and pick them up - will call and we will bring to his car

## 2019-05-29 ENCOUNTER — Ambulatory Visit: Payer: Medicare Other | Admitting: Internal Medicine

## 2019-06-01 ENCOUNTER — Other Ambulatory Visit: Payer: Self-pay | Admitting: Cardiology

## 2019-06-01 MED ORDER — SERTRALINE HCL 25 MG PO TABS: 25.0000 mg | ORAL_TABLET | Freq: Every day | ORAL | 1 refills | Status: DC

## 2019-06-19 DIAGNOSIS — R509 Fever, unspecified: Secondary | ICD-10-CM | POA: Diagnosis not present

## 2019-06-19 DIAGNOSIS — Z20828 Contact with and (suspected) exposure to other viral communicable diseases: Secondary | ICD-10-CM | POA: Diagnosis not present

## 2019-06-23 ENCOUNTER — Telehealth: Payer: Self-pay

## 2019-06-23 NOTE — Telephone Encounter (Signed)
Spoke with pt regarding appt on 06/26/19. Pt stated him and his wife have tested positive for Covid-19 and have been quarantine for the past five days. Pt was advise to check his vitals prior to his appt and give me a call if he has any further questions.

## 2019-06-26 ENCOUNTER — Other Ambulatory Visit: Payer: Self-pay

## 2019-06-26 ENCOUNTER — Telehealth (INDEPENDENT_AMBULATORY_CARE_PROVIDER_SITE_OTHER): Payer: Medicare Other | Admitting: Internal Medicine

## 2019-06-26 ENCOUNTER — Encounter: Payer: Self-pay | Admitting: Internal Medicine

## 2019-06-26 VITALS — BP 141/81 | HR 59

## 2019-06-26 DIAGNOSIS — G4733 Obstructive sleep apnea (adult) (pediatric): Secondary | ICD-10-CM

## 2019-06-26 DIAGNOSIS — I10 Essential (primary) hypertension: Secondary | ICD-10-CM

## 2019-06-26 DIAGNOSIS — I2510 Atherosclerotic heart disease of native coronary artery without angina pectoris: Secondary | ICD-10-CM | POA: Diagnosis not present

## 2019-06-26 DIAGNOSIS — I4819 Other persistent atrial fibrillation: Secondary | ICD-10-CM

## 2019-06-26 NOTE — Progress Notes (Signed)
Electrophysiology TeleHealth Note  Due to national recommendations of social distancing due to [place] 19, an audio telehealth visit is felt to be most appropriate for this patient at this time.  Verbal consent was obtained by me for the telehealth visit today.  The patient does not have capability for a virtual visit.  A phone visit is therefore required today.   Date:  06/26/2019   ID:  [name] [name], DOB Oct 22, 1950, MRN [id number]  Location: patient's home  Provider location:  [facility]  Evaluation Performed: Follow-up visit  PCP:  Patient, No Pcp Per   Electrophysiologist:  Dr Rayann Heman  Chief Complaint:  palpitations  History of Present Illness:    [name] [name] is a 68 y.o. male who presents via telehealth conferencing today.  Since last being seen in our clinic, the patient reports doing ok.  He tested + for COVID 1-2 weeks ago, with URI symptoms.   He continues to have labile BP.  He has rare palpitations when he gets excited but denies sustained arrhythmias. Today, he denies symptoms of chest pain, shortness of breath,  lower extremity edema, dizziness, presyncope, or syncope.  He thinks he will need to have knee surgery this summer.  The patient is otherwise without complaint today.  Past Medical History:  Diagnosis Date  . Anxiety   . Coronary artery disease    2v CABG, 2004 [facility])  . DJD (degenerative joint disease)   . HLD (hyperlipidemia)   . Hypertension   . MI, old 2004  . OA (osteoarthritis)   . Obesity   . PAF (paroxysmal atrial fibrillation) ([place])    a. started on sotalol 08/2016.  Marland Kitchen Sleep apnea    on CPAP  . Spinal stenosis   . Typical atrial flutter [facility])     Past Surgical History:  Procedure Laterality Date  . ATRIAL FIBRILLATION ABLATION [name] 05/24/2018   Procedure: ATRIAL FIBRILLATION ABLATION;  Surgeon: Thompson Grayer, MD;  Location: [place] CV LAB;  Service: Cardiovascular;  Laterality: [name];  . BACK SURGERY  2011  . CARPAL TUNNEL RELEASE      bilateral  . CORONARY ARTERY BYPASS GRAFT     2004  . ELECTROPHYSIOLOGIC STUDY [name] 09/15/2016   Procedure: Atrial Fibrillation Ablation;  Surgeon: Thompson Grayer, MD;  Location: [place] CV [facility];  Service: Cardiovascular;  Laterality: [name];  . fistula surgery    . hemorrhoidectomy    . LEFT HEART CATH AND CORS/GRAFTS ANGIOGRAPHY [name] 02/07/2018   Procedure: LEFT HEART CATH AND CORS/GRAFTS ANGIOGRAPHY;  Surgeon: Martinique, Peter M, MD;  Location: [place] CV LAB;  Service: Cardiovascular;  Laterality: [name];  . LUMBAR LAMINECTOMY    . right foot fracture    . TEE WITHOUT CARDIOVERSION [name] 09/15/2016   Procedure: TRANSESOPHAGEAL ECHOCARDIOGRAM (TEE);  Surgeon: Lelon Perla, MD;  Location: [place];  Service: Cardiovascular;  Laterality: [name];  . TOTAL KNEE ARTHROPLASTY Left 07/07/2017   Procedure: LEFT TOTAL KNEE ARTHROPLASTY;  Surgeon: Latanya Maudlin, MD;  Location: [facility];  Service: Orthopedics;  Laterality: Left;  . TRANSTHORACIC ECHOCARDIOGRAM  08/25/2010   EF 55-60%    Current Outpatient Medications  Medication Sig Dispense Refill  . acetaminophen (TYLENOL) 650 MG CR tablet Take 650 mg by mouth every 8 (eight) hours as needed for pain.    Marland Kitchen apixaban (ELIQUIS) 5 MG TABS tablet Take 1 tablet (5 mg total) by mouth 2 (two) times daily. 42 tablet 0  . atorvastatin (LIPITOR) 20 MG tablet Take  1 tablet (20 mg total) by mouth daily. 90 tablet 1  . Coenzyme Q10 (CO Q 10) 100 MG CAPS Take 400 mg by mouth daily.     Marland Kitchen diltiazem (CARDIZEM CD) 180 MG 24 hr capsule Take 1 capsule (180 mg total) by mouth daily. 90 capsule 3  . eplerenone (INSPRA) 25 MG tablet Take 1 tablet (25 mg total) by mouth 2 (two) times daily. 90 tablet 1  . fish oil-omega-3 fatty acids 1000 MG capsule Take 1 g by mouth 2 (two) times daily.     . furosemide (LASIX) 20 MG tablet Take 1 tablet (20 mg total) by mouth daily as needed (for swelling/weight gain). 90 tablet 1  . hydrALAZINE (APRESOLINE) 50 MG tablet TAKE 1  TABLET BY MOUTH THREE TIMES A DAY 270 tablet 1  . irbesartan (AVAPRO) 300 MG tablet Take 1 tablet (300 mg total) by mouth daily. 90 tablet 1  . metoprolol tartrate (LOPRESSOR) 25 MG tablet Take 1 tablet (25 mg total) by mouth every 8 (eight) hours as needed (a-fib). 180 tablet 1  . Multiple Vitamin (MULTIVITAMIN) tablet Take 1 tablet by mouth daily.    . nitroGLYCERIN (NITROSTAT) 0.4 MG SL tablet Place 1 tablet (0.4 mg total) under the tongue every 5 (five) minutes x 3 doses as needed for chest pain. 25 tablet 1  . omeprazole (PRILOSEC) 20 MG capsule TAKE 1 CAPSULE BY MOUTH EVERY OTHER DAY. 45 capsule 3  . PRESCRIPTION MEDICATION Pt uses CPAP machine at bedtime    . sertraline (ZOLOFT) 25 MG tablet Take 1 tablet (25 mg total) by mouth daily. 90 tablet 1   No current facility-administered medications for this visit.     Allergies:   Aldactone [spironolactone], Chlorthalidone, Nsaids, and Vancomycin   Social History:  The patient  reports that he has never smoked. He has never used smokeless tobacco. He reports that he does not drink alcohol or use drugs.   Family History:  The patient's  family history includes Breast cancer in his mother; Coronary artery disease in his father; Hypertension in his father and mother; Stroke in his father.   ROS:  Please see the history of present illness.   All other systems are personally reviewed and negative.    Exam:    Vital Signs:  BP (!) 141/81   Pulse (!) 59   Well sounding, alert and conversant   Labs/Other Tests and Data Reviewed:    Recent Labs: 07/04/2018: ALT 26; TSH 5.468 10/10/2018: BUN 16; Creat 1.05; Potassium 4.7; Sodium 139   Wt Readings from Last 3 Encounters:  03/17/19 245 lb (111.1 kg)  11/24/18 244 lb 6.4 oz (110.9 kg)  11/16/18 246 lb (111.6 kg)      ASSESSMENT & PLAN:    1.  Persistent atrial fibrillation/ atrial flutter Well controlled post ablation off AAD therapy On eliquis for chads2vasc score of 3  2. CAD s/p  CABG No ischemic symptoms  3. HTN Stable No change required today 2 gram sodium diet advised Followed by Dr Wyline Mood  4. Obesity Lifestyle modification is encouraged  5. OSA Compliant with CPAP  Follow-up:  12 months with me Dr Wyline Mood as scheduled   Patient Risk:  after full review of this patients clinical status, I feel that they are at moderate risk at this time.  Today, I have spent 15 minutes with the patient with telehealth technology discussing arrhythmia management .    Signed, Hillis Range, MD  06/26/2019 11:57 AM  [facility] [address] [place] [address] [phone] (office) [phone] (fax) .

## 2019-07-07 ENCOUNTER — Other Ambulatory Visit: Payer: Self-pay | Admitting: Cardiology

## 2019-07-18 ENCOUNTER — Telehealth: Payer: Self-pay | Admitting: Cardiology

## 2019-07-18 NOTE — Telephone Encounter (Signed)
Patient called stated having BP issues- would not give any other details -only wanted to speak with nurse

## 2019-07-18 NOTE — Telephone Encounter (Signed)
Reports elevated BP 174/98 today. Reports BP ranging around 155-170/85-95 for one month. Reports headache that he uses tylenol for and the headache improves. Reports left arm numbness since January 2020 with neck and shoulder pain into left arm. Does not know if his weight has changed and does not have a scale at home. Denies dizziness, chest pain, sob, blurred vision, numbness or tingling in any other body parts besides what is mentioned above. Reports testing positive for covid 5 weeks ago. Virtual visit scheduled with Dr. Harl Bowie 07/20/2019 @2 :00 pm, advised to continue monitoring his symptoms and BP, and if they get worse, to go to the ED for an evaluation. Verbalized understanding of plan.  Patient verbally consented for telehealth visits with [facility] and understands that her insurance company will be billed for the encounter.   Aware to have vitals. Unable to check weight.

## 2019-07-20 ENCOUNTER — Encounter: Payer: Self-pay | Admitting: Cardiology

## 2019-07-20 ENCOUNTER — Other Ambulatory Visit: Payer: Self-pay

## 2019-07-20 ENCOUNTER — Emergency Department (HOSPITAL_COMMUNITY): Payer: Medicare Other

## 2019-07-20 ENCOUNTER — Emergency Department (HOSPITAL_COMMUNITY)
Admission: EM | Admit: 2019-07-20 | Discharge: 2019-07-20 | Disposition: A | Discharge status: Home / Self Care | Payer: Medicare Other | Attending: Emergency Medicine | Admitting: Emergency Medicine

## 2019-07-20 ENCOUNTER — Encounter (HOSPITAL_COMMUNITY): Payer: Self-pay

## 2019-07-20 ENCOUNTER — Telehealth (INDEPENDENT_AMBULATORY_CARE_PROVIDER_SITE_OTHER): Payer: Medicare Other | Admitting: Cardiology

## 2019-07-20 VITALS — BP 174/104 | HR 68

## 2019-07-20 DIAGNOSIS — I10 Essential (primary) hypertension: Secondary | ICD-10-CM | POA: Diagnosis not present

## 2019-07-20 DIAGNOSIS — R0602 Shortness of breath: Secondary | ICD-10-CM | POA: Diagnosis not present

## 2019-07-20 DIAGNOSIS — I2510 Atherosclerotic heart disease of native coronary artery without angina pectoris: Secondary | ICD-10-CM

## 2019-07-20 DIAGNOSIS — F419 Anxiety disorder, unspecified: Secondary | ICD-10-CM

## 2019-07-20 DIAGNOSIS — I252 Old myocardial infarction: Secondary | ICD-10-CM | POA: Insufficient documentation

## 2019-07-20 DIAGNOSIS — Z79899 Other long term (current) drug therapy: Secondary | ICD-10-CM | POA: Insufficient documentation

## 2019-07-20 DIAGNOSIS — Z951 Presence of aortocoronary bypass graft: Secondary | ICD-10-CM | POA: Insufficient documentation

## 2019-07-20 DIAGNOSIS — Z7901 Long term (current) use of anticoagulants: Secondary | ICD-10-CM | POA: Diagnosis not present

## 2019-07-20 DIAGNOSIS — I517 Cardiomegaly: Secondary | ICD-10-CM | POA: Diagnosis not present

## 2019-07-20 HISTORY — DX: COVID-19: U07.1

## 2019-07-20 LAB — CBC WITH DIFFERENTIAL/PLATELET
Abs Immature Granulocytes: 0.04 10*3/uL (ref 0.00–0.07)
Basophils Absolute: 0 10*3/uL (ref 0.0–0.1)
Basophils Relative: 1 %
Eosinophils Absolute: 0.2 10*3/uL (ref 0.0–0.5)
Eosinophils Relative: 3 %
HCT: 40.3 % (ref 39.0–52.0)
Hemoglobin: 12.9 g/dL — ABNORMAL LOW (ref 13.0–17.0)
Immature Granulocytes: 1 %
Lymphocytes Relative: 24 %
Lymphs Abs: 2 10*3/uL (ref 0.7–4.0)
MCH: 28.7 pg (ref 26.0–34.0)
MCHC: 32 g/dL (ref 30.0–36.0)
MCV: 89.6 fL (ref 80.0–100.0)
Monocytes Absolute: 0.9 10*3/uL (ref 0.1–1.0)
Monocytes Relative: 11 %
Neutro Abs: 5 10*3/uL (ref 1.7–7.7)
Neutrophils Relative %: 60 %
Platelets: 318 10*3/uL (ref 150–400)
RBC: 4.5 MIL/uL (ref 4.22–5.81)
RDW: 14.3 % (ref 11.5–15.5)
WBC: 8.2 10*3/uL (ref 4.0–10.5)
nRBC: 0 % (ref 0.0–0.2)

## 2019-07-20 LAB — URINALYSIS, ROUTINE W REFLEX MICROSCOPIC
Bacteria, UA: NONE SEEN
Bilirubin Urine: NEGATIVE
Glucose, UA: NEGATIVE mg/dL
Hgb urine dipstick: NEGATIVE
Ketones, ur: NEGATIVE mg/dL
Leukocytes,Ua: NEGATIVE
Nitrite: NEGATIVE
Protein, ur: 30 mg/dL — AB
Specific Gravity, Urine: 1.016 (ref 1.005–1.030)
pH: 7 (ref 5.0–8.0)

## 2019-07-20 LAB — COMPREHENSIVE METABOLIC PANEL
ALT: 18 U/L (ref 0–44)
AST: 18 U/L (ref 15–41)
Albumin: 3.6 g/dL (ref 3.5–5.0)
Alkaline Phosphatase: 66 U/L (ref 38–126)
Anion gap: 8 (ref 5–15)
BUN: 15 mg/dL (ref 8–23)
CO2: 21 mmol/L — ABNORMAL LOW (ref 22–32)
Calcium: 9.1 mg/dL (ref 8.9–10.3)
Chloride: 109 mmol/L (ref 98–111)
Creatinine, Ser: 0.77 mg/dL (ref 0.61–1.24)
GFR calc Af Amer: >60 mL/min (ref >60)
GFR calc non Af Amer: >60 mL/min (ref >60)
Glucose, Bld: 97 mg/dL (ref 70–99)
Potassium: 3.8 mmol/L (ref 3.5–5.1)
Sodium: 138 mmol/L (ref 135–145)
Total Bilirubin: 0.5 mg/dL (ref 0.3–1.2)
Total Protein: 7.4 g/dL (ref 6.5–8.1)

## 2019-07-20 LAB — BRAIN NATRIURETIC PEPTIDE: B Natriuretic Peptide: 203 pg/mL — ABNORMAL HIGH (ref 0.0–100.0)

## 2019-07-20 LAB — TROPONIN I (HIGH SENSITIVITY)
Troponin I (High Sensitivity): 20 ng/L — ABNORMAL HIGH (ref <18)
Troponin I (High Sensitivity): 18 ng/L — ABNORMAL HIGH (ref <18)

## 2019-07-20 MED ORDER — LORAZEPAM 1 MG PO TABS
1.0000 mg | ORAL_TABLET | Freq: Once | ORAL | Status: AC
  Administered 2019-07-20: 1 mg via ORAL
  Filled 2019-07-20: qty 1

## 2019-07-20 MED ORDER — LORAZEPAM 0.5 MG PO TABS: 0.5000 mg | ORAL_TABLET | Freq: Three times a day (TID) | ORAL | 0 refills | Status: DC

## 2019-07-20 NOTE — ED Provider Notes (Signed)
[facility] EMERGENCY DEPARTMENT Provider Note   CSN: [id number] Arrival date & time: 07/20/19  1524     History   Chief Complaint Chief Complaint  Patient presents with  . Shortness of Breath    HPI [name] [name] is a 68 y.o. male.     Patient is a 68 year old male who presents to the emergency department with a complaint of shortness of breath and high blood pressure.  The patient states that he has been having problems with his blood pressure over the last few weeks.  He has had problems with shortness of breath also for about a week or more.  The patient states that he has been checking his blood pressure at home.  And his systolic has been in the 956L and 180s.  His diastolic has been higher than usual.  The patient was seen through a telemedicine conference with Dr. Harl Bowie earlier today.  And given the patient's history of hypertension is well as his shortness of breath, history of COVID-19 last month, and coronary artery disease, it was the opinion that he should be seen in the emergency department for evaluation.  The patient was previously on chlorthalidone, but this had to be stopped because of gout flareups.  Patient has been on a series of medications to try to get his blood pressure under control.  The patient states that he has had problems with his blood pressure since age 26.  The patient states that he has shortness of breath with exertion.  He had previously been able to go to the gym and workout, he says now he has shortness of breath with only minimal activity.  He is also noticed some mild edema of the lower extremities.  Is been no hemoptysis.  No fever reported recently.  No injury to the chest.  The patient uses a CPAP machine, but does not require oxygen at home.  The patient is also concerned about his anxiety.  He says that he gets super excited when he takes his blood pressure, and especially if when it is elevated this high.  The patient is concerned that he  may have a stroke similar to what his father had.  The patient states he is also concerned because nothing seems to be bringing his pressure down.  He presents now for assistance with these issues.  The history is provided by the patient.    Past Medical History:  Diagnosis Date  . Anxiety   . Coronary artery disease    2v CABG, 2004 [facility])  . COVID-19   . DJD (degenerative joint disease)   . HLD (hyperlipidemia)   . Hypertension   . MI, old 2004  . OA (osteoarthritis)   . Obesity   . Persistent atrial fibrillation ([place])    failed medical therapy with sotalol, s/p PVI x 2  . Sleep apnea    on CPAP  . Spinal stenosis   . Typical atrial flutter [facility])     Patient Active Problem List   Diagnosis Date Noted  . Paroxysmal atrial fibrillation ([place]) 05/24/2018  . Unstable angina ([place]) 02/04/2018  . Typical atrial flutter ([place])   . Spinal stenosis   . Sleep apnea   . QT prolongation   . Palpitations   . PAF (paroxysmal atrial fibrillation) ([place])   . OA (osteoarthritis)   . Hypertension   . HLD (hyperlipidemia)   . Edema   . DJD (degenerative joint disease)   . Coronary artery disease   .  Chronic chest pain   . 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 A-fib (H[facility]) 09/15/2016  . Chest pain 09/09/2016  . Obesity 08/22/2016  . Anxiety 08/22/2016  . Encounter for monitoring sotalol therapy 08/20/2016  . Morbidly obese (H[facility]) 07/12/2016  . Medical non-compliance 07/12/2016  . Elevated TSH 07/09/2016  . BPH without urinary obstruction 07/09/2016  . Need for hepatitis C screening test 07/09/2016  . Idiopathic gout 07/09/2016  . Hyperglycemia 08/01/2015  . Atrial fibrillation with RVR (HCC) 03/28/2015  . Routine general medical examination at a health care facility 08/11/2013  . Chronic sinus bradycardia   . Obstructive sleep apnea 09/12/2010  . MYOCARDIAL INFARCTION 09/11/2010  . Spinal stenosis, unspecified region other than cervical 09/11/2010  . Hyperlipidemia  with target LDL less than 70 09/08/2010  . Depression 09/08/2010  . Essential hypertension 09/08/2010  . Coronary atherosclerosis 09/08/2010  . Congestive heart failure (HCC) 09/08/2010  . GERD 09/08/2010  . MI, old 10/05/2002    Past Surgical History:  Procedure Laterality Date  . ATRIAL FIBRILLATION ABLATION [name] 05/24/2018   Procedure: ATRIAL FIBRILLATION ABLATION;  Surgeon: Hillis Range, MD;  [facility] IN[facility] LAB;  Service: Cardiovascular;  Laterality: [name];  . BACK SURGERY  2011  . CARPAL TUNNEL RELEASE     bilateral  . CORONARY ARTERY BYPASS GRAFT     2004  . ELECTROPHYSIOLOGIC STUDY [name] 09/15/2016   Procedure: Atrial Fibrillation Ablation;  Surgeon: Hillis Range, MD;  Location: [facility] INVASIVE CV LAB;  Service: Cardiovascular;  Laterality: [name];  . fistula surgery    . hemorrhoidectomy    . LEFT HEART CATH AND CORS/GRAFTS ANGIOGRAPHY [name] 02/07/2018   Procedure: LEFT HEART CATH AND CORS/GRAFTS ANGIOGRAPHY;  Surgeon: [name], Peter M, MD;  Location: [name] INVASIVE CV LAB;  Service: Cardiovascular;  Laterality: [name];  . LUMBAR LAMINECTOMY    . right foot fracture    . TEE WITHOUT CARDIOVERSION [name] 09/15/2016   Procedure: TRANSESOPHAGEAL ECHOCARDIOGRAM (TEE);  Surgeon: Lewayne Bunting, MD;  Location: [facility] ENDOSCOPY;  Service: Cardiovascular;  Laterality: [name];  . TOTAL KNEE ARTHROPLASTY Left 07/07/2017   Procedure: LEFT TOTAL KNEE ARTHROPLASTY;  Surgeon: Ranee Gosselin, MD;  [facility];  Service: Orthopedics;  Laterality: Left;  . TRANSTHORACIC ECHOCARDIOGRAM  08/25/2010   EF 55-60%        Home Medications    Prior to Admission medications   Medication Sig Start Date End Date Taking? Authorizing Provider  acetaminophen (TYLENOL) 650 MG CR tablet Take 650 mg by mouth every 8 (eight) hours as needed for pain.    [provider]  apixaban (ELIQUIS) 5 MG TABS tablet Take 1 tablet (5 mg total) by mouth 2 (two) times daily. 03/17/19   Antoine Poche, MD  atorvastatin  (LIPITOR) 20 MG tablet Take 1 tablet (20 mg total) by mouth daily. 03/17/19   Antoine Poche, MD  Coenzyme Q10 (CO Q 10) 100 MG CAPS Take 400 mg by mouth daily.     [provider]  diltiazem (CARDIZEM CD) 180 MG 24 hr capsule Take 1 capsule (180 mg total) by mouth daily. 03/17/19 07/20/19  Antoine Poche, MD  eplerenone (INSPRA) 25 MG tablet TAKE 1 TABLET BY MOUTH TWICE A DAY 07/07/19   Antoine Poche, MD  fish oil-omega-3 fatty acids 1000 MG capsule Take 1 g by mouth 2 (two) times daily.     [provider]  furosemide (LASIX) 20 MG tablet Take 1 tablet (20 mg total) by  mouth daily as needed (for swelling/weight gain). 03/17/19   Antoine PocheBranch, Jonathan F, MD  hydrALAZINE (APRESOLINE) 50 MG tablet TAKE 1 TABLET BY MOUTH THREE TIMES A DAY 03/20/19   Antoine PocheBranch, Jonathan F, MD  irbesartan (AVAPRO) 300 MG tablet Take 1 tablet (300 mg total) by mouth daily. 03/17/19   Antoine PocheBranch, Jonathan F, MD  metoprolol tartrate (LOPRESSOR) 25 MG tablet TAKE 1 TABLET (25 MG TOTAL) BY MOUTH EVERY 8 (EIGHT) HOURS AS NEEDED (A-FIB). 07/07/19   Antoine PocheBranch, Jonathan F, MD  Multiple Vitamin (MULTIVITAMIN) tablet Take 1 tablet by mouth daily.    [provider]  nitroGLYCERIN (NITROSTAT) 0.4 MG SL tablet Place 1 tablet (0.4 mg total) under the tongue every 5 (five) minutes x 3 doses as needed for chest pain. 02/07/18   Arty Baumgartneroberts, Lindsay B, NP  omeprazole (PRILOSEC) 20 MG capsule TAKE 1 CAPSULE BY MOUTH EVERY OTHER DAY. 11/01/18   Antoine PocheBranch, Jonathan F, MD  PRESCRIPTION MEDICATION Pt uses CPAP machine at bedtime    [provider]  sertraline (ZOLOFT) 25 MG tablet Take 1 tablet (25 mg total) by mouth daily. 06/01/19   Antoine PocheBranch, Jonathan F, MD    Family History Family History  Problem Relation Age of Onset  . Breast cancer Mother   . Hypertension Mother   . Coronary artery disease Father   . Stroke Father   . Hypertension Father     Social History Social History   Tobacco Use  . Smoking status:  Never Smoker  . Smokeless tobacco: Never Used  Substance Use Topics  . Alcohol use: No    Alcohol/week: 0.0 standard drinks  . Drug use: No     Allergies   Aldactone [spironolactone], Chlorthalidone, Nsaids, and Vancomycin   Review of Systems Review of Systems  Constitutional: Negative for activity change and appetite change.  HENT: Negative for congestion, ear discharge, ear pain, facial swelling, nosebleeds, rhinorrhea, sneezing and tinnitus.   Eyes: Negative for photophobia, pain and discharge.  Respiratory: Positive for shortness of breath. Negative for cough, choking and wheezing.   Cardiovascular: Negative for chest pain, palpitations and leg swelling.  Gastrointestinal: Negative for abdominal pain, blood in stool, constipation, diarrhea, nausea and vomiting.  Genitourinary: Negative for difficulty urinating, dysuria, flank pain, frequency and hematuria.  Musculoskeletal: Negative for back pain, gait problem, myalgias and neck pain.  Skin: Negative for color change, rash and wound.  Neurological: Negative for dizziness, seizures, syncope, facial asymmetry, speech difficulty, weakness and numbness.  Hematological: Negative for adenopathy. Does not bruise/bleed easily.  Psychiatric/Behavioral: Negative for agitation, confusion, hallucinations, self-injury and suicidal ideas. The patient is nervous/anxious.      Physical Exam Updated Vital Signs BP (!) 180/86 (BP Location: Right Arm)   Pulse (!) 58   Temp 98.1 F (36.7 C) (Oral)   Resp 20   Ht 5\' 6"  (1.676 m)   Wt 125 kg   SpO2 95%   BMI 44.48 kg/m   Physical Exam Vitals signs and nursing note reviewed.  Constitutional:      Appearance: He is well-developed. He is not toxic-appearing.  HENT:     Head: Normocephalic.     Right Ear: Tympanic membrane and external ear normal.     Left Ear: Tympanic membrane and external ear normal.  Eyes:     General: Lids are normal.     Pupils: Pupils are equal, round, and  reactive to light.  Neck:     Musculoskeletal: Normal range of motion and neck supple.  Vascular: No carotid bruit.  Cardiovascular:     Rate and Rhythm: Normal rate and regular rhythm.     Pulses: Normal pulses.     Heart sounds: Normal heart sounds.  Pulmonary:     Effort: No respiratory distress.     Breath sounds: Normal breath sounds. No wheezing, rhonchi or rales.  Chest:     Chest wall: No edema.  Abdominal:     General: Bowel sounds are normal.     Palpations: Abdomen is soft.     Tenderness: There is no abdominal tenderness. There is no guarding.  Musculoskeletal: Normal range of motion.     Right lower leg: 1+ Edema present.     Left lower leg: 1+ Edema present.  Lymphadenopathy:     Head:     Right side of head: No submandibular adenopathy.     Left side of head: No submandibular adenopathy.     Cervical: No cervical adenopathy.  Skin:    General: Skin is warm and dry.  Neurological:     Mental Status: He is alert and oriented to person, place, and time.     Cranial Nerves: No cranial nerve deficit.     Sensory: No sensory deficit.  Psychiatric:        Mood and Affect: Mood is anxious.        Speech: Speech normal.      ED Treatments / Results  Labs (all labs ordered are listed, but only abnormal results are displayed) Labs Reviewed - No data to display  EKG None  Radiology No results found.  Procedures Procedures (including critical care time)  Medications Ordered in ED Medications - No data to display   Initial Impression / Assessment and Plan / ED Course  I have reviewed the triage vital signs and the nursing notes.  Pertinent labs & imaging results that were available during my care of the patient were reviewed by me and considered in my medical decision making (see chart for details).          Final Clinical Impressions(s) / ED Diagnoses MDM  Blood pressure is elevated 174/104, and 180/86..  Pulse oximetry is 95 on room air, and  100% on 2 L of oxygen.  Electrocardiogram shows normal sinus rhythm.  There is an old right bundle branch block present.  There is no STEMI noted.  No life-threatening arrhythmias.  Patient is very anxious.  Will use Ativan to help him calm down.  Recheck.  Blood pressure 146 systolic.  Patient feeling a little better, but still feels somewhat short of breath.  No chest pain reported.  Patient normal sinus rhythm on the monitor.  Comprehensive metabolic panel shows the CO2 slightly low at 21, otherwise completely within normal limits.  The anion gap is normal at 8.  The complete blood count is within normal limits.  The urine analysis is also within normal limits.  Portable chest x-ray shows no acute cardiopulmonary process, and only mild cardiomegaly.  Patient's blood pressure back up to 164 systolic.  Patient very anxious about why his blood pressure keeps going up and down.  B natruretic peptide is slightly elevated at 203.  The initial troponin is 20.  Second troponin is 18.  I discussed the findings with the patient in terms of which he understands.  Patient states that he was told to come to the emergency department by Dr. Wyline MoodBranch, and he would like Dr. Wyline MoodBranch to know the results of his work-up.  On-call cardiology paged.  Case discussed in detail.  It is the opinion that the patient could be discharged home, continue current medications, and discuss changes in blood pressure medication with [name] on tomorrow.  I have also asked the patient to discuss assistance with his anxiety with [name] for his primary physician. Questions from both patient and patient's wife were answered as best possible.  Patient advised to return to the emergency department immediately if any changes in condition, worsening of symptoms, problems or concerns.   Final diagnoses:  Shortness of breath  Essential hypertension  Anxiety    ED Discharge Orders         Ordered    LORazepam (ATIVAN) 0.5 MG  tablet  3 times daily     07/20/19 2141           Ivery Quale, PA-C 07/21/19 1139    Donnetta Hutching, MD 07/24/19 1154

## 2019-07-20 NOTE — Patient Instructions (Signed)
Your physician recommends that you schedule a follow-up appointment in: 3-4 WEEKS WITH DR [facility]   AS DISCUSSED WITH DR Harl Bowie, YOU WERE TO GO TO [facility] ED FOR EVALUATION   Your physician recommends that you continue on your current medications as directed. Please refer to the Current Medication list given to you today.  Thank you for choosing [place]!!

## 2019-07-20 NOTE — Discharge Instructions (Addendum)
Your oxygen level is 95 to 96% on room air, within normal limits by my interpretation.  2 electrocardiograms showed no acute cardiac findings.  2 cardiac enzymes were negative for acute changes.  Your chest x-ray is within normal limits.  I discussed your case with the cardiologist on-call for tonight, and it was the opinion that you should follow-up with Dr. Harl Bowie on tomorrow for additional work-up and evaluation.  A prescription for a few days of Ativan have been given to assist with the anxiety portion of your complaint.  This medication may cause drowsiness.  Please do not drive a vehicle, drink alcohol, operate machinery, or participate in activities requiring concentration when taking this medication. Please return to the emergency department if there are emergent changes in your condition, or worsening of your symptoms.

## 2019-07-20 NOTE — Progress Notes (Signed)
Virtual Visit via Telephone Note   This visit type was conducted due to national recommendations for restrictions regarding the COVID-19 Pandemic (e.g. social distancing) in an effort to limit this patient's exposure and mitigate transmission in our community.  Due to his co-morbid illnesses, this patient is at least at moderate risk for complications without adequate follow up.  This format is felt to be most appropriate for this patient at this time.  The patient did not have access to video technology/had technical difficulties with video requiring transitioning to audio format only (telephone).  All issues noted in this document were discussed and addressed.  No physical exam could be performed with this format.  Please refer to the patient's chart for his  consent to telehealth for [facility].   Date:  07/20/2019   ID:  [name] [name], DOB August 08, 1951, MRN [id number]  Patient Location: Home Provider Location: Office  PCP:  Patient, No Pcp Per  Cardiologist:  Carlyle Dolly, MD  Electrophysiologist:  Thompson Grayer, MD   Evaluation Performed:  Follow-Up Visit  Chief Complaint:  SOB  History of Present Illness:    [name] [name] is a 68 y.o. male seen today for follow up of the following medical problems.   1. HTN   - bp's in clinic typically elevated, suspect some white coat HTN - he checks at home regularly. Had been 120s-140s until he had to stop chlorthalidone 37.5mg  daily for gout flare . Since that time bp's have been difficult to control, has had some reported symptoms - currently on dilt 360, eplerenone 50mg  (gynecomastia on aldactone), irbesartan 300. Headaches on hydralazine, stopped.Started on prazosin due to lack of other options, stopped due to severe dizziness. Dilt prevoiusly lowered from 360 to 180 for possible fatigue and headaches. Off prazosin due to dizziness  - he has stopped his chlothalidone again due to gout exacerbation, he increased his  hydralazine to 50mg  tid and is tolerating.    - home bp's elevated x 1 month SBP 170s.  - compliant with meds    2. SOB - no cough or wheezing - mild LE edema, does not have a scale unsure if he gained any weight - DOE with mild activities. No orthopnea. Severe fatigue. Reports a diagnosis of COVID 1 month over 1 month ago   3. CAD  - prior 2 vessel CABG in 2004 at [facility]  - apparently had follow up cath in 2006 from [facility] with patent grafts. Reports negative stress test in 2011 by Dr Sabra Heck in [place]. 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    4. OSA  - compliant with CPAP - followed by Dr Elsworth Soho.   5. Hyperlipidemia  - noted some side effects on higher lipitor doses, has tolerated lower dose well  6. OSA  - compliant with CPAP  7. Afib - s/p ablation. He had failed rate control as well as multiple antiarrhythmics - no recent palpitations. No bleeding on eliquis  - recurrent afib 02/2018 - 05/2018 repeat ablation.   - denies any palpitations.   The patient does not have symptoms concerning for COVID-19 infection (fever, chills, cough, or new shortness of breath).    Past Medical History:  Diagnosis Date  . Anxiety   .  Coronary artery disease    2v CABG, 2004 [facility])  . DJD (degenerative joint disease)   . HLD (hyperlipidemia)   . Hypertension   . MI, old 2004  . OA (osteoarthritis)   . Obesity   . Persistent atrial fibrillation    failed medical therapy with sotalol, s/p PVI x 2  . Sleep apnea    on CPAP  . Spinal stenosis   . Typical atrial flutter [facility])    Past Surgical History:  Procedure Laterality Date  . ATRIAL FIBRILLATION ABLATION [name] 05/24/2018   Procedure: ATRIAL  FIBRILLATION ABLATION;  Surgeon: Hillis Range, MD;  Location: [facility] INVASIVE CV LAB;  Service: Cardiovascular;  Laterality: [name];  . BACK SURGERY  2011  . CARPAL TUNNEL RELEASE     bilateral  . CORONARY ARTERY BYPASS GRAFT     2004  . ELECTROPHYSIOLOGIC STUDY [name] 09/15/2016   Procedure: Atrial Fibrillation Ablation;  Surgeon: Hillis Range, MD;  Location: [facility] INVASIVE CV LAB;  Service: Cardiovascular;  Laterality: [name];  . fistula surgery    . hemorrhoidectomy    . LEFT HEART CATH AND CORS/GRAFTS ANGIOGRAPHY [name] 02/07/2018   Procedure: LEFT HEART CATH AND CORS/GRAFTS ANGIOGRAPHY;  Surgeon: [name], Peter M, MD;  Location: [facility] INVASIVE CV LAB;  Service: Cardiovascular;  Laterality: [name];  . LUMBAR LAMINECTOMY    . right foot fracture    . TEE WITHOUT CARDIOVERSION [name] 09/15/2016   Procedure: TRANSESOPHAGEAL ECHOCARDIOGRAM (TEE);  Surgeon: Lewayne Bunting, MD;  Location: [facility] ENDOSCOPY;  Service: Cardiovascular;  Laterality: [name];  . TOTAL KNEE ARTHROPLASTY Left 07/07/2017   Procedure: LEFT TOTAL KNEE ARTHROPLASTY;  Surgeon: Ranee Gosselin, MD;  Location: [facility] ORS;  Service: Orthopedics;  Laterality: Left;  . TRANSTHORACIC ECHOCARDIOGRAM  08/25/2010   EF 55-60%     No outpatient medications have been marked as taking for the 07/20/19 encounter (Appointment) with Antoine Poche, MD.     Allergies:   Aldactone [spironolactone], Chlorthalidone, Nsaids, and Vancomycin   Social History   Tobacco Use  . Smoking status: Never Smoker  . Smokeless tobacco: Never Used  Substance Use Topics  . Alcohol use: No    Alcohol/week: 0.0 standard drinks  . Drug use: No     Family Hx: The patient's family history includes Breast cancer in his mother; Coronary artery disease in his father; Hypertension in his father and mother; Stroke in his father.  ROS:   Please see the history of present illness.     All other systems reviewed and are negative.   Prior CV studies:   The following studies were  reviewed today:  03/2015 echo Study Conclusions  - Left ventricle: The cavity size was normal. Wall thickness was increased in a pattern of mild LVH. Systolic function was normal. The estimated ejection fraction was in the range of 50% to 55%. Wall motion was normal; there were no regional wall motion abnormalities. Doppler parameters are consistent with high ventricular filling pressure. - Left atrium: The atrium was mildly dilated.   03/2015 Lexiscan MPI  No T wave inversion was noted during stress.  There was no ST segment deviation noted during stress.  The study is normal.  This is a low risk study.  The left ventricular ejection fraction is normal (55-65%).  Nuclear stress EF: 61%.  02/2018 cath  Prox LAD lesion is 100% stenosed.  Prox Cx to Mid Cx lesion is 60% stenosed.  Post Atrio lesion is 100% stenosed.  SVG graft was visualized by angiography and  is large.  The graft exhibits minimal luminal irregularities.  LIMA graft was visualized by angiography and is normal in caliber.  The graft exhibits no disease.  The left ventricular systolic function is normal.  LV end diastolic pressure is normal.  The left ventricular ejection fraction is 55-65% by visual estimate.  1. 2 vessel occlusive CAD - 100% proximal LAD - 60% mid LCx - 100% PLOM with left to right collaterals. 2. Patent LIMA to the LAD 3. Patent SVG to the diagonal 4. Normal LV function 5. Normal LVEDP   12/2018 heart monitor  48 hr holter monitor  Min HR 46, Max HR 106, Avg HR 62  Rare ventricular ectopy, all in the form of isolated PVCs  Occasional supraventricular ectopy, primarily as isolated PACs. One 3 beat run of atach.  No significant arrhythmias   Labs/Other Tests and Data Reviewed:    EKG:  No ECG reviewed.  Recent Labs: 10/10/2018: BUN 16; Creat 1.05; Potassium 4.7; Sodium 139   Recent Lipid Panel Lab Results  Component Value Date/Time    CHOL 157 02/05/2018 02:27 AM   TRIG 131 02/05/2018 02:27 AM   HDL 29 (L) 02/05/2018 02:27 AM   CHOLHDL 5.4 02/05/2018 02:27 AM   LDLCALC 102 (H) 02/05/2018 02:27 AM    Wt Readings from Last 3 Encounters:  03/17/19 245 lb (111.1 kg)  11/24/18 244 lb 6.4 oz (110.9 kg)  11/16/18 246 lb (111.6 kg)     Objective:    Vital Signs:   Today's Vitals   07/20/19 1335  BP: (!) 174/104  Pulse: 68   There is no height or weight on file to calculate BMI. Normal affect. Normal speech pattern and tone. Comfortable though sounds somewhat faituged, no audible signs of SOB or wheezing.   ASSESSMENT & PLAN:    1. HTN - complicated management as reported above, primarily due to medication side effects - he is going to ER for evaluation of his SOB. If no acute findings that would warrant alternative options would try increasing his hydralazine.   2. SOB/DOE - unclear etiology, I cannot sufficiently assess him over the phone. He reports and his wife agrees, significant progressing SOB/DOE. He reports some LE edema, unclear if may be having some CHF. COVID diagnosis 5 weeks ago, unclear if some residual issues from that. Will need workup in ER including CXR, BNP, EKG, troponins.      3. CAD - no specific chest pain, does have SOB/DOE and nonspecific arm pains - coming to ER for evaluation, will need EKG and troponins.     Discussed with patient and his wife, he will present to the [facility] ER.    COVID-19 Education: The signs and symptoms of COVID-19 were discussed with the patient and how to seek care for testing (follow up with PCP or arrange E-visit).  The importance of social distancing was discussed today.  Time:   Today, I have spent 25 minutes with the patient with telehealth technology discussing the above problems.     Medication Adjustments/Labs and Tests Ordered: Current medicines are reviewed at length with the patient today.  Concerns regarding medicines are outlined  above.   Tests Ordered: No orders of the defined types were placed in this encounter.   Medication Changes: No orders of the defined types were placed in this encounter.   Follow Up:  In Person in 3 week(s)  Signed, Dina Rich, MD  07/20/2019 12:36 PM    [facility]  

## 2019-07-20 NOTE — ED Notes (Signed)
Pt states he gets "excited" when he takes BP at home. States he can feel his heart racing before he takes BP because he knows it will be high. Pt states he takes his BP 30-40 times a day. Pt BP decreased to 143/83 after Ativan tablet. Pt inquiring about prescription for Ativan or similar med for home use.

## 2019-07-20 NOTE — ED Triage Notes (Addendum)
PT reports hypertension for "last few weeks", and SOB for "a week or two". Pt also complaining of ataxia intermittently.   Pt states at 1 pm today his BP was 174/104 with a home reading, per Dr. Harl Bowie pt was told to go to ED for evaluation. States he is on several BP meds at home.   Patient reports he tested positive for COVID on 06/14/2019.

## 2019-07-21 ENCOUNTER — Telehealth: Payer: Self-pay | Admitting: Cardiology

## 2019-07-21 NOTE — Telephone Encounter (Signed)
ER workup reviewed, fortunately the testing looks good overall. We need to try to get his bp down further to see if that alone helps his symptoms, verify he is taking hydralazine 50mg  tid, if so I would increase his hydralazine to 75mg  tid. Only check bp once daily and update [place] on Monday. Do not check bp more than once daily    Zandra Abts MD

## 2019-07-21 NOTE — Telephone Encounter (Signed)
[name] [name] called wanting to know if someone would be calling her today. States that Mr. Esterline is very anxious.

## 2019-07-21 NOTE — Telephone Encounter (Signed)
It is our practice policy not to prescribe controlled substances, we continue to recommend establishing with a pcp. If he wants to see a specialist for anxiety as opposed to establishing with a pcp we could place a referal in to behavioral health.   J Toby Breithaupt MD

## 2019-07-21 NOTE — Telephone Encounter (Signed)
ED notes in chart pt had labs and EKG CXR done and per notes pt wanted Dr Harl Bowie to look at his results

## 2019-07-21 NOTE — Telephone Encounter (Signed)
Pt says BP has came down 144/67 today - will increase hydralazine 75 mg tid - pt was given ativan 0.5 mg tid in ED and thinks this has helped with BP as well - pt wanted to know if Dr Harl Bowie would consider refilling this since this helps with his BP (aware that we do not normally manage this but pt has no pcp and every time he goes to one they want to change his BP medications)  - pt agreeable to only take BP once daily and call us early next week with readings

## 2019-07-21 NOTE — Telephone Encounter (Signed)
Home from [facility] ED- they did not keep patient or do anything about his blood pressure being high per wife.   Said they were told to call Dr Harl Bowie first thing this morning

## 2019-07-25 NOTE — Telephone Encounter (Signed)
BP readings starting on this past Saturday 154/81 HR 58 Sunday 155/88 HR 54 Monday 150/82 HR 55 Today 150/81 HR 58 - pt voiced understanding of controlled substance and declined referral a this time - says new providers have opened up in [place] and that he would consider establishing with them for pcp.

## 2019-07-25 NOTE — Telephone Encounter (Signed)
BP is improving but still elevated. If tolerating the meds I would increase hydral to 100mg  tid and have him update [place] again early next week   J Jeannifer Drakeford MD

## 2019-07-27 NOTE — Telephone Encounter (Signed)
Routed to provider

## 2019-07-27 NOTE — Telephone Encounter (Signed)
Pt has been having slight headaches with increase of hydralazine 75 mg tid and wanted to wait on increasing at this time. Says BP yesterday after taking meds was 142/78 - will continue to monitor

## 2019-07-28 NOTE — Telephone Encounter (Signed)
Ok can wait on any further change, bp's are reasonable at this time   Zandra Abts MD

## 2019-07-31 ENCOUNTER — Telehealth: Payer: Self-pay | Admitting: Cardiology

## 2019-07-31 ENCOUNTER — Telehealth: Payer: Self-pay | Admitting: Pulmonary Disease

## 2019-07-31 DIAGNOSIS — Z23 Encounter for immunization: Secondary | ICD-10-CM | POA: Diagnosis not present

## 2019-07-31 DIAGNOSIS — M1990 Unspecified osteoarthritis, unspecified site: Secondary | ICD-10-CM | POA: Diagnosis not present

## 2019-07-31 DIAGNOSIS — F419 Anxiety disorder, unspecified: Secondary | ICD-10-CM | POA: Diagnosis not present

## 2019-07-31 DIAGNOSIS — M542 Cervicalgia: Secondary | ICD-10-CM | POA: Diagnosis not present

## 2019-07-31 DIAGNOSIS — F329 Major depressive disorder, single episode, unspecified: Secondary | ICD-10-CM | POA: Diagnosis not present

## 2019-07-31 NOTE — Telephone Encounter (Signed)
Spoke with pt. He is needing new CPAP supplies. Pt has not been seen in over 1 year. Pt has been scheduled for a televisit for Tammy on 08/01/2019 at 0930. Nothing further was needed.

## 2019-07-31 NOTE — Telephone Encounter (Signed)
Pt says he has established with pcp Dr Emiliano Dyer office and Zoloft was increased 50 mg daily and refilled ativan - says manual check by NP in office was 138/72 - says he has pinched nerve in his neck with arthritis - started prednisone tomorrow to reduce inflammation in neck and numbness in arm - pt will be on vacation for f/u appt in November and we RS this for 10/11/2019 pt will contact us if has any problems before then - routed to provider FYI

## 2019-07-31 NOTE — Telephone Encounter (Signed)
Went to PCP and has med changes to update with [place]. Also would like to know if he still needs to keep Nov 6th  appt

## 2019-07-31 NOTE — Telephone Encounter (Signed)
Pt is overdue for an OV to get replacement cpap supplies, but was diagnosed with covid X2 wks ago.    Ok to do televisit to get supplies?

## 2019-08-01 ENCOUNTER — Encounter: Payer: Self-pay | Admitting: Adult Health

## 2019-08-01 ENCOUNTER — Other Ambulatory Visit: Payer: Self-pay

## 2019-08-01 ENCOUNTER — Ambulatory Visit (INDEPENDENT_AMBULATORY_CARE_PROVIDER_SITE_OTHER): Payer: Medicare Other | Admitting: Adult Health

## 2019-08-01 DIAGNOSIS — G4733 Obstructive sleep apnea (adult) (pediatric): Secondary | ICD-10-CM

## 2019-08-01 NOTE — Progress Notes (Signed)
Virtual Visit via Telephone Note  I connected with Vertell Novak on 08/01/19 at  9:30 AM EDT by telephone and verified that I am speaking with the correct person using two identifiers.  Location: Patient: Home  Provider: Office    I discussed the limitations, risks, security and privacy concerns of performing an evaluation and management service by telephone and the availability of in person appointments. I also discussed with the patient that there may be a patient responsible charge related to this service. The patient expressed understanding and agreed to proceed.   History of Present Illness: 68 year old male followed for severe obstructive sleep apnea on nocturnal CPAP  Today's televisit is a follow-up for obstructive sleep apnea.  Last seen in 2018. Patient has underlying severe obstructive sleep apnea is on nocturnal CPAP.  Patient says he wears his CPAP every night gets in about 8 to 9 hours.  Says he feels rested with no significant daytime sleepiness.  He is not been having any troubles with his CPAP machine.  Says occasionally his mask leaks and he is currently needs a new mask. Download shows excellent compliance with 100% usage.  Daily average usage at 9 hours.  AHI 5.1./hr. CPAP is set at 13 cm H2O.     Observations/Objective: New CPAP machine in 2017  PSG 09/2010 RDI 80/h Auto 12/2010: Optimal cpap 14cm, great compliance   Assessment and Plan: Severe obstructive sleep apnea with excellent compliance and control on nocturnal CPAP  Plan  Patient Instructions  Continue on CPAP at bedtime, keep up the good work Work on [employer] Do not drive if sleepy Follow-up with Dr. Elsworth Soho in 1 year and as needed     Follow Up Instructions: Follow-up in 1 year and as needed   I discussed the assessment and treatment plan with the patient. The patient was provided an opportunity to ask questions and all were answered. The patient agreed with the plan and demonstrated an  understanding of the instructions.   The patient was advised to call back or seek an in-person evaluation if the symptoms worsen or if the condition fails to improve as anticipated.  I provided 22  minutes of non-face-to-face time during this encounter.   Rexene Edison, NP

## 2019-08-01 NOTE — Patient Instructions (Signed)
Continue on CPAP at bedtime, keep up the good work Work on [employer] Do not drive if sleepy Follow-up with Dr. Elsworth Soho in 1 year and as needed

## 2019-08-11 ENCOUNTER — Ambulatory Visit: Payer: Medicare Other | Admitting: Cardiology

## 2019-08-21 DIAGNOSIS — Z6835 Body mass index (BMI) 35.0-35.9, adult: Secondary | ICD-10-CM | POA: Diagnosis not present

## 2019-08-21 DIAGNOSIS — M542 Cervicalgia: Secondary | ICD-10-CM | POA: Diagnosis not present

## 2019-08-28 DIAGNOSIS — M542 Cervicalgia: Secondary | ICD-10-CM | POA: Diagnosis not present

## 2019-08-28 DIAGNOSIS — F329 Major depressive disorder, single episode, unspecified: Secondary | ICD-10-CM | POA: Diagnosis not present

## 2019-08-28 DIAGNOSIS — F419 Anxiety disorder, unspecified: Secondary | ICD-10-CM | POA: Diagnosis not present

## 2019-09-02 DIAGNOSIS — M542 Cervicalgia: Secondary | ICD-10-CM | POA: Diagnosis not present

## 2019-09-11 DIAGNOSIS — G8194 Hemiplegia, unspecified affecting left nondominant side: Secondary | ICD-10-CM | POA: Insufficient documentation

## 2019-09-11 DIAGNOSIS — I10 Essential (primary) hypertension: Secondary | ICD-10-CM | POA: Diagnosis not present

## 2019-09-11 DIAGNOSIS — G8192 Hemiplegia, unspecified affecting left dominant side: Secondary | ICD-10-CM | POA: Diagnosis not present

## 2019-09-11 DIAGNOSIS — M542 Cervicalgia: Secondary | ICD-10-CM | POA: Diagnosis not present

## 2019-09-11 DIAGNOSIS — M4712 Other spondylosis with myelopathy, cervical region: Secondary | ICD-10-CM | POA: Diagnosis not present

## 2019-09-11 DIAGNOSIS — Z6839 Body mass index (BMI) 39.0-39.9, adult: Secondary | ICD-10-CM | POA: Diagnosis not present

## 2019-09-11 DIAGNOSIS — M4802 Spinal stenosis, cervical region: Secondary | ICD-10-CM | POA: Diagnosis not present

## 2019-09-12 ENCOUNTER — Other Ambulatory Visit: Payer: Self-pay | Admitting: *Deleted

## 2019-09-12 MED ORDER — HYDRALAZINE HCL 50 MG PO TABS: 75.0000 mg | ORAL_TABLET | Freq: Three times a day (TID) | ORAL | 1 refills | Status: DC

## 2019-09-13 ENCOUNTER — Other Ambulatory Visit: Payer: Self-pay | Admitting: *Deleted

## 2019-09-13 ENCOUNTER — Other Ambulatory Visit: Payer: Self-pay | Admitting: Neurosurgery

## 2019-09-13 MED ORDER — IRBESARTAN 300 MG PO TABS: 300.0000 mg | ORAL_TABLET | Freq: Every day | ORAL | 3 refills | Status: DC

## 2019-09-18 NOTE — Pre-Procedure Instructions (Signed)
[name] [name]  09/18/2019      [id number] [place] - [place] [address] Phone: [phone] Fax: [phone]  [place], [place] [address] Phone: [phone] Fax: [phone]    Your procedure is scheduled on Dec. 17  Report to [facility] at 8:30 A.M.  Call this number if you have problems the morning of surgery:  [phone]   Remember:  Do not eat or drink after midnight.      Take these medicines the morning of surgery with A SIP OF WATER :             Tylenol if needed             Atorvastatin (lipitor)             diltiazem (cardizem )             Hydralazine (apresoline )             Lorazepam (ativan)             Metoprolol (lopressor)             Nitroglycerine  If needed             Omeprazole (prilosec)             Sertraline (zoloft)              7 days prior to surgery STOP taking any Aspirin (unless otherwise instructed by your surgeon), Aleve, Naproxen, Ibuprofen, Motrin, Advil, Goody's, BC's, all herbal medications, fish oil, and all vitamins.              Follow your surgeon's instructions on when to stop eliquis.  If no instructions were given by your surgeon then you will need to call the office to get those instructions.      Do not wear jewelry.  Do not wear lotions, powders, or perfumes, or deodorant.  Do not shave 48 hours prior to surgery.  Men may shave face and neck.  Do not bring valuables to the hospital.  [facility] is not responsible for any belongings or valuables.  Contacts, dentures or bridgework may not be worn into surgery.  Leave your suitcase in the car.  After surgery it may be brought to your room.  For patients admitted to the hospital, discharge time will be determined by your treatment team.  Patients discharged the day of surgery will not be allowed to drive home.    Special  instructions:   - Preparing For Surgery  Before surgery, you can play an important role. Because skin is not sterile, your skin needs to be as free of germs as possible. You can reduce the number of germs on your skin by washing with CHG (chlorahexidine gluconate) Soap before surgery.  CHG is an antiseptic cleaner which kills germs and bonds with the skin to continue killing germs even after washing.    Oral Hygiene is also important to reduce your risk of infection.  Remember - BRUSH YOUR TEETH THE MORNING OF SURGERY WITH YOUR REGULAR TOOTHPASTE  Please do not use if you have an allergy to CHG or antibacterial soaps. If your skin becomes reddened/irritated stop using the CHG.  Do not shave (including legs and underarms) for at least 48 hours prior to first CHG shower. It is OK to  shave your face.  Please follow these instructions carefully.   1. Shower the NIGHT BEFORE SURGERY and the MORNING OF SURGERY with CHG.   2. If you chose to wash your hair, wash your hair first as usual with your normal shampoo.  3. After you shampoo, rinse your hair and body thoroughly to remove the shampoo.  4. Use CHG as you would any other liquid soap. You can apply CHG directly to the skin and wash gently with a scrungie or a clean washcloth.   5. Apply the CHG Soap to your body ONLY FROM THE NECK DOWN.  Do not use on open wounds or open sores. Avoid contact with your eyes, ears, mouth and genitals (private parts). Wash Face and genitals (private parts)  with your normal soap.  6. Wash thoroughly, paying special attention to the area where your surgery will be performed.  7. Thoroughly rinse your body with warm water from the neck down.  8. DO NOT shower/wash with your normal soap after using and rinsing off the CHG Soap.  9. Pat yourself dry with a CLEAN TOWEL.  10. Wear CLEAN PAJAMAS to bed the night before surgery, wear comfortable clothes the morning of surgery  11. Place CLEAN SHEETS on  your bed the night of your first shower and DO NOT SLEEP WITH PETS.    Day of Surgery:  Do not apply any deodorants/lotions.  Please wear clean clothes to the hospital/surgery center.   Remember to brush your teeth WITH YOUR REGULAR TOOTHPASTE.    Please read over the following fact sheets that you were given. Coughing and Deep Breathing and Surgical Site Infection Prevention

## 2019-09-19 ENCOUNTER — Other Ambulatory Visit: Payer: Self-pay

## 2019-09-19 ENCOUNTER — Encounter (HOSPITAL_COMMUNITY)
Admission: RE | Admit: 2019-09-19 | Discharge: 2019-09-19 | Disposition: A | Discharge status: Home / Self Care | Payer: Medicare Other | Source: Ambulatory Visit | Attending: Neurosurgery | Admitting: Neurosurgery

## 2019-09-19 ENCOUNTER — Other Ambulatory Visit (HOSPITAL_COMMUNITY)
Admission: RE | Admit: 2019-09-19 | Discharge: 2019-09-19 | Disposition: A | Discharge status: Home / Self Care | Payer: Medicare Other | Source: Ambulatory Visit | Attending: Neurosurgery | Admitting: Neurosurgery

## 2019-09-19 ENCOUNTER — Encounter (HOSPITAL_COMMUNITY): Payer: Self-pay

## 2019-09-19 DIAGNOSIS — Z8619 Personal history of other infectious and parasitic diseases: Secondary | ICD-10-CM | POA: Insufficient documentation

## 2019-09-19 DIAGNOSIS — I110 Hypertensive heart disease with heart failure: Secondary | ICD-10-CM | POA: Insufficient documentation

## 2019-09-19 DIAGNOSIS — Z79899 Other long term (current) drug therapy: Secondary | ICD-10-CM | POA: Insufficient documentation

## 2019-09-19 DIAGNOSIS — I509 Heart failure, unspecified: Secondary | ICD-10-CM | POA: Insufficient documentation

## 2019-09-19 DIAGNOSIS — F419 Anxiety disorder, unspecified: Secondary | ICD-10-CM | POA: Insufficient documentation

## 2019-09-19 DIAGNOSIS — Z7901 Long term (current) use of anticoagulants: Secondary | ICD-10-CM | POA: Insufficient documentation

## 2019-09-19 DIAGNOSIS — Z01812 Encounter for preprocedural laboratory examination: Secondary | ICD-10-CM | POA: Insufficient documentation

## 2019-09-19 DIAGNOSIS — I252 Old myocardial infarction: Secondary | ICD-10-CM | POA: Insufficient documentation

## 2019-09-19 DIAGNOSIS — Z823 Family history of stroke: Secondary | ICD-10-CM | POA: Insufficient documentation

## 2019-09-19 DIAGNOSIS — E669 Obesity, unspecified: Secondary | ICD-10-CM | POA: Insufficient documentation

## 2019-09-19 DIAGNOSIS — Z6839 Body mass index (BMI) 39.0-39.9, adult: Secondary | ICD-10-CM | POA: Insufficient documentation

## 2019-09-19 DIAGNOSIS — Z96652 Presence of left artificial knee joint: Secondary | ICD-10-CM | POA: Insufficient documentation

## 2019-09-19 DIAGNOSIS — G4730 Sleep apnea, unspecified: Secondary | ICD-10-CM | POA: Insufficient documentation

## 2019-09-19 DIAGNOSIS — E785 Hyperlipidemia, unspecified: Secondary | ICD-10-CM | POA: Insufficient documentation

## 2019-09-19 DIAGNOSIS — Z951 Presence of aortocoronary bypass graft: Secondary | ICD-10-CM | POA: Insufficient documentation

## 2019-09-19 DIAGNOSIS — M47812 Spondylosis without myelopathy or radiculopathy, cervical region: Secondary | ICD-10-CM | POA: Insufficient documentation

## 2019-09-19 DIAGNOSIS — Z20828 Contact with and (suspected) exposure to other viral communicable diseases: Secondary | ICD-10-CM | POA: Insufficient documentation

## 2019-09-19 DIAGNOSIS — Z9049 Acquired absence of other specified parts of digestive tract: Secondary | ICD-10-CM | POA: Insufficient documentation

## 2019-09-19 DIAGNOSIS — I483 Typical atrial flutter: Secondary | ICD-10-CM | POA: Insufficient documentation

## 2019-09-19 DIAGNOSIS — I4819 Other persistent atrial fibrillation: Secondary | ICD-10-CM | POA: Insufficient documentation

## 2019-09-19 DIAGNOSIS — I25810 Atherosclerosis of coronary artery bypass graft(s) without angina pectoris: Secondary | ICD-10-CM | POA: Insufficient documentation

## 2019-09-19 DIAGNOSIS — K219 Gastro-esophageal reflux disease without esophagitis: Secondary | ICD-10-CM | POA: Insufficient documentation

## 2019-09-19 HISTORY — DX: Dyspnea, unspecified: R06.00

## 2019-09-19 HISTORY — DX: Personal history of urinary calculi: Z87.442

## 2019-09-19 HISTORY — DX: Gastro-esophageal reflux disease without esophagitis: K21.9

## 2019-09-19 HISTORY — DX: Heart failure, unspecified: I50.9

## 2019-09-19 LAB — CBC
HCT: 45.2 % (ref 39.0–52.0)
Hemoglobin: 15.2 g/dL (ref 13.0–17.0)
MCH: 29.4 pg (ref 26.0–34.0)
MCHC: 33.6 g/dL (ref 30.0–36.0)
MCV: 87.4 fL (ref 80.0–100.0)
Platelets: 312 10*3/uL (ref 150–400)
RBC: 5.17 MIL/uL (ref 4.22–5.81)
RDW: 13.4 % (ref 11.5–15.5)
WBC: 9.8 10*3/uL (ref 4.0–10.5)
nRBC: 0 % (ref 0.0–0.2)

## 2019-09-19 LAB — BASIC METABOLIC PANEL
Anion gap: 8 (ref 5–15)
BUN: 12 mg/dL (ref 8–23)
CO2: 25 mmol/L (ref 22–32)
Calcium: 9.8 mg/dL (ref 8.9–10.3)
Chloride: 105 mmol/L (ref 98–111)
Creatinine, Ser: 0.74 mg/dL (ref 0.61–1.24)
GFR calc Af Amer: >60 mL/min (ref >60)
GFR calc non Af Amer: >60 mL/min (ref >60)
Glucose, Bld: 99 mg/dL (ref 70–99)
Potassium: 3.9 mmol/L (ref 3.5–5.1)
Sodium: 138 mmol/L (ref 135–145)

## 2019-09-19 LAB — TYPE AND SCREEN
ABO/RH(D): O POS
Antibody Screen: NEGATIVE

## 2019-09-19 LAB — SURGICAL PCR SCREEN
MRSA, PCR: NEGATIVE
Staphylococcus aureus: NEGATIVE

## 2019-09-19 LAB — SARS CORONAVIRUS 2 (TAT 6-24 HRS): SARS Coronavirus 2: NEGATIVE

## 2019-09-19 LAB — ABO/RH: ABO/RH(D): O POS

## 2019-09-19 NOTE — Progress Notes (Signed)
PCP - Dellia Nims @ [facility] Cardiologist - Branch  PPM/ICD - na Device Orders -  Rep Notified -   Chest x-ray - na EKG - 07/21/19 Stress Test -   ECHO - 03/17/18 Cardiac Cath - 02/07/18  Sleep Study - 2012 CPAP - yes  Fasting Blood Sugar - na Checks Blood Sugar _____ times a day  Blood Thinner Instructions:last dose eliquis 09/17/19 (per Karel Jarvis, P.A. pt. Doesn't need PT drawn at preadmission or DOS) Aspirin Instructions:  ERAS Protcol -na PRE-SURGERY Ensure or G2-   COVID TEST- 09/19/19   Anesthesia review: cardiac hx. Patient denies shortness of breath, fever, cough and chest pain at PAT appointment   All instructions explained to the patient, with a verbal understanding of the material. Patient agrees to go over the instructions while at home for a better understanding. Patient also instructed to self quarantine after being tested for COVID-19. The opportunity to ask questions was provided.

## 2019-09-20 ENCOUNTER — Telehealth: Payer: Self-pay | Admitting: Pulmonary Disease

## 2019-09-20 DIAGNOSIS — G4733 Obstructive sleep apnea (adult) (pediatric): Secondary | ICD-10-CM

## 2019-09-20 NOTE — Telephone Encounter (Signed)
Order placed for the setting change and for download to be sent to office in 2 weeks. Called and spoke with pt in regards to this and he verbalized understanding.  Pt's surgery is scheduled at [facility] tomorrow and he has to be there at 8:30. Pt tried to call [facility] to see if they could help him out with the setting change but was unable to reach them. Pt is going to take his machine and the nasal pillows mask with him to the hospital to see if respiratory can help him out and to also see if they can get his settings changed if he does not hear anything from [facility] prior to going tomorrow. Nothing further needed.

## 2019-09-20 NOTE — Anesthesia Preprocedure Evaluation (Addendum)
Anesthesia Evaluation  Patient identified by MRN, date of birth, ID band Patient awake    Reviewed: Allergy & Precautions, NPO status , Patient's Chart, lab work & pertinent test results, reviewed documented beta blocker date and time   History of Anesthesia Complications Negative for: history of anesthetic complications  Airway Mallampati: III  TM Distance: <3 FB Neck ROM: Full    Dental  (+) Teeth Intact, Dental Advisory Given   Pulmonary sleep apnea and Continuous Positive Airway Pressure Ventilation ,    Pulmonary exam normal breath sounds clear to auscultation       Cardiovascular hypertension, Pt. on home beta blockers and Pt. 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  Reproductive/Obstetrics                            Anesthesia Physical Anesthesia Plan  ASA: III  Anesthesia Plan: General   Post-op Pain Management:    Induction: Intravenous  PONV Risk Score and Plan: 3 and Midazolam, Dexamethasone and Ondansetron  Airway Management Planned: Oral ETT and Video Laryngoscope Planned  Additional Equipment: Arterial line  Intra-op Plan:   Post-operative Plan: Extubation in OR  Informed Consent: I have reviewed the patients History and Physical, chart, labs and discussed  the procedure including the risks, benefits and alternatives for the proposed anesthesia with the patient or authorized representative who has indicated his/her understanding and acceptance.     Dental advisory given  Plan Discussed with: CRNA  Anesthesia Plan Comments: (PAT note written 09/20/2019 by Myra Gianotti, PA-C. )     Anesthesia Quick Evaluation

## 2019-09-20 NOTE — Telephone Encounter (Signed)
Decrease to 11cm H20- download in 2 weeks

## 2019-09-20 NOTE — Telephone Encounter (Signed)
Called and spoke with pt who stated he feels like the cpap is putting out too much pressure.   Pt is having surgery tomorrow on discs in neck and will have to wear a cervical collar for awhile due to this.  Pt does have a cpap face mask but due to the surgery he is about to have on his neck, he will not be able to wear the face mask so he decided to try out his nasal pillows that he has and at that point, he said he felt like it was putting out too much pressure.  Pt's pressure is set at 13cm. I have printed a download of pt's info.  Since Dr. Elsworth Soho is not in the office, sending to APP of the day. [name], please advise on this for pt.

## 2019-09-20 NOTE — Progress Notes (Signed)
Anesthesia Chart Review:  Case: [id number] Date/Time: 09/21/19 1015   Procedure: ANTERIOR CERVICAL DECOMPRESSION/DISCECTOMY FUSION CERVICAL 3- CERVICAL 4, CERVICAL 4- CERVICAL 5, CERVICAL 5- CERVICAL 6 ([name] ) - ANTERIOR CERVICAL DECOMPRESSION/DISCECTOMY FUSION CERVICAL 3- CERVICAL 4, CERVICAL 4- CERVICAL 5, CERVICAL 5- CERVICAL 6   Anesthesia type: General   Pre-op diagnosis: CERVICAL SPONDYLOSIS WITH MYELOPATHY AND RADICULOPATHY   [facility] / [facility]   Surgeons: Tressie Stalker, MD      DISCUSSION: Patient is a 68 year old male scheduled for the above procedure.  History includes never smoker, CAD (NSTEMI, s/p CABG 2004, [facility]), afib/flutter (s/p ablation 09/15/16, 05/24/18), CHF, HTN, HLD, anxiety, exertional dyspnea, GERD, COVID-19+ (06/2019).  Cardiologist is [name]. Last telemedicine 07/20/19 with reports of SOB and HTN (BP 174/108, 180/86). Given limitations of telemedicine, patient was referred to go [facility] ED. Reported he was quite anxious about his elevated BP given his father had a history of CVA. He felt better after given Ativan. High sensitivity Troponin I Was 20-->18 (normal < 18). Cardiology was contacted for update, and patient ultimately felt patient could be discharged home on same medications and contact [name] for further medication instructions. Dr Wyline Mood increased hydralazine but overall felt ED evaluation was reassuring ("loos good overall").  [name] was contacted for preoperative cardiology input, and he classified patient as "Low Risk" and gave permission to hold Eliquis for 3 days prior to surgery.     Last Eliquis 09/17/19. PT/INR not done at PAT given he is on Eliquis and not warfarin. If anesthesiologist desires PT/INR then would have to get day of surgery.    COVID-19 infection > 21 days ago (in 06/2019). 09/19/19 COVID-19 test negative. Anesthesia team to evaluate on the day of surgery.    VS: BP (!) 159/68   Pulse (!) 56   Temp (!) 36.1 C (Oral)    Resp 20   Ht 5\' 6"  (1.676 m)   Wt 112 kg   SpO2 95%   BMI 39.87 kg/m    PROVIDERS: , NP is PCP Georgana Curio - Werner Lean) Octavio Manns, MD is cardiologist.   Dina Rich, MD is EP cardiologist. Last telemedicine visit 06/26/19 with one year follow-up recommended.  06/28/19, MD is pulmonologist. Last evaluation 08/01/19 with 08/03/19, NP for OSA follow-up.    LABS: Labs reviewed: Acceptable for surgery. (all labs ordered are listed, but only abnormal results are displayed)  Labs Reviewed  SURGICAL PCR SCREEN  BASIC METABOLIC PANEL  CBC  TYPE AND SCREEN     IMAGES: 1V CXR 07/20/19: FINDINGS: Shallow inspiration. No focal consolidation, pleural effusion, pneumothorax. Mild cardiomegaly. No acute osseous pathology. Median sternotomy wires and CABG vascular clips noted. IMPRESSION: 1. No acute cardiopulmonary process. 2. Mild cardiomegaly.    EKG: 07/20/19: Sinus rhythm Incomplete right bundle branch block no significant change since earlier in the day Confirmed by 07/22/19 [phone]) on 07/21/2019 12:03:48 PM   CV: 48 Hour Holter monitor 12/26/18:  48 hr holter monitor  Min HR 46, Max HR 106, Avg HR 62  Rare ventricular ectopy, all in the form of isolated PVCs  Occasional supraventricular ectopy, primarily as isolated PACs. One 3 beat run of atach.  No significant arrhythmias   EP Study 05/24/18: 1. Comprehensive electrophysiologic study. 2. Coronary sinus pacing and recording. 3. Three-dimensional mapping of atrial fibrillation with additional mapping and ablation of a second discrete focus (left atrial flutter) as well as additional left atrial 3D mapping and ablation due  to persistence of afib. 4. Ablation of atrial fibrillation with additional mapping and ablation of a second discrete focus ( left atrial flutter) s well as additional left atrial 3D mapping and ablation due to persistence of afib. 5. Intracardiac  echocardiography. 6. Transseptal puncture of an intact septum. 7. Arrhythmia induction with pacing with isuprel and adenosine infusion   Echo 03/17/18: Study Conclusions - Left ventricle: The cavity size was normal. There was mild   concentric hypertrophy. Systolic function was normal. The   estimated ejection fraction was in the range of 60% to 65%. Wall   motion was normal; there were no regional wall motion   abnormalities. Features are consistent with a pseudonormal left   ventricular filling pattern, with concomitant abnormal relaxation   and increased filling pressure (grade 2 diastolic dysfunction).   Doppler parameters are consistent with high ventricular filling   pressure. - Aortic valve: Mildly to moderately calcified annulus. Trileaflet.   Cardiac cath 02/07/18:  Prox LAD lesion is 100% stenosed.  Prox Cx to Mid Cx lesion is 60% stenosed.  Post Atrio lesion is 100% stenosed.  SVG graft was visualized by angiography and is large.  The graft exhibits minimal luminal irregularities.  LIMA graft was visualized by angiography and is normal in caliber.  The graft exhibits no disease.  The left ventricular systolic function is normal.  LV end diastolic pressure is normal.  The left ventricular ejection fraction is 55-65% by visual estimate. 1. 2 vessel occlusive CAD    - 100% proximal LAD    - 60% mid LCx    - 100% PLOM with left to right collaterals. 2. Patent LIMA to the LAD 3. Patent SVG to the diagonal 4. Normal LV function 5. Normal LVEDP Plan: continue medical management. May resume Eliquis this evening if no bleeding problems. The PLOM CTO is not suitable for PCI.   Past Medical History:  Diagnosis Date  . Anxiety   . CHF (congestive heart failure) (HCC)    h/o due to fluid overload in recovery room  . Coronary artery disease    2v CABG, 2004 [facility])  . COVID-19   . DJD (degenerative joint disease)   . Dyspnea    on exertion  . GERD  (gastroesophageal reflux disease)   . History of kidney stones    3 stones yrs. ago  . HLD (hyperlipidemia)   . Hypertension   . MI, old 2004  . OA (osteoarthritis)   . Obesity   . Persistent atrial fibrillation (HCC)    failed medical therapy with sotalol, s/p PVI x 2  . Sleep apnea    on CPAP  . Spinal stenosis   . Typical atrial flutter [facility])     Past Surgical History:  Procedure Laterality Date  . ATRIAL FIBRILLATION ABLATION [name] 05/24/2018   Procedure: ATRIAL FIBRILLATION ABLATION;  Surgeon: Hillis Range, MD;  [facility] INVASIVE CV LAB;  Service: Cardiovascular;  Laterality: [name];  . BACK SURGERY  2011  . CARPAL TUNNEL RELEASE     bilateral  . CHOLECYSTECTOMY    . CORONARY ARTERY BYPASS GRAFT     2004  . ELECTROPHYSIOLOGIC STUDY [name] 09/15/2016   Procedure: Atrial Fibrillation Ablation;  Surgeon: Hillis Range, MD;  Location: [facility] INVASIVE CV LAB;  Service: Cardiovascular;  Laterality: [name];  . fistula surgery    . hemorrhoidectomy    . LEFT HEART CATH AND CORS/GRAFTS ANGIOGRAPHY [name] 02/07/2018   Procedure: LEFT HEART CATH AND CORS/GRAFTS ANGIOGRAPHY;  Surgeon: [name], Peter  M, MD;  Location: [place] CV LAB;  Service: Cardiovascular;  Laterality: [name];  . LUMBAR LAMINECTOMY    . right foot fracture    . TEE WITHOUT CARDIOVERSION [name] 09/15/2016   Procedure: TRANSESOPHAGEAL ECHOCARDIOGRAM (TEE);  Surgeon: Lelon Perla, MD;  Location: [place];  Service: Cardiovascular;  Laterality: [name];  . TOTAL KNEE ARTHROPLASTY Left 07/07/2017   Procedure: LEFT TOTAL KNEE ARTHROPLASTY;  Surgeon: Latanya Maudlin, MD;  Location: [facility] ORS;  Service: Orthopedics;  Laterality: Left;  . TRANSTHORACIC ECHOCARDIOGRAM  08/25/2010   EF 55-60%    MEDICATIONS: . acetaminophen (TYLENOL) 500 MG tablet  . apixaban (ELIQUIS) 5 MG TABS tablet  . atorvastatin (LIPITOR) 20 MG tablet  . Coenzyme Q10 (CO Q 10 PO)  . diltiazem (CARDIZEM CD) 180 MG 24 hr capsule  . eplerenone (INSPRA) 25 MG tablet   . furosemide (LASIX) 20 MG tablet  . hydrALAZINE (APRESOLINE) 50 MG tablet  . irbesartan (AVAPRO) 300 MG tablet  . LORazepam (ATIVAN) 0.5 MG tablet  . metoprolol tartrate (LOPRESSOR) 25 MG tablet  . Multiple Vitamin (MULTIVITAMIN) tablet  . nitroGLYCERIN (NITROSTAT) 0.4 MG SL tablet  . omeprazole (PRILOSEC) 20 MG capsule  . PRESCRIPTION MEDICATION  . sertraline (ZOLOFT) 50 MG tablet   No current facility-administered medications for this encounter.    Myra Gianotti, PA-C Surgical Short Stay/Anesthesiology [facility] Phone [phone] [facility] Phone [phone] 09/20/2019 1:42 PM

## 2019-09-21 ENCOUNTER — Inpatient Hospital Stay (HOSPITAL_COMMUNITY): Payer: Medicare Other | Admitting: Anesthesiology

## 2019-09-21 ENCOUNTER — Other Ambulatory Visit: Payer: Self-pay | Admitting: Cardiology

## 2019-09-21 ENCOUNTER — Other Ambulatory Visit: Payer: Self-pay

## 2019-09-21 ENCOUNTER — Encounter (HOSPITAL_COMMUNITY)
Admission: RE | Disposition: A | Discharge status: Rehabilitation Facility | Payer: Self-pay | Source: Home / Self Care | Attending: Neurosurgery

## 2019-09-21 ENCOUNTER — Encounter (HOSPITAL_COMMUNITY): Payer: Self-pay | Admitting: Neurosurgery

## 2019-09-21 ENCOUNTER — Inpatient Hospital Stay (HOSPITAL_COMMUNITY): Payer: Medicare Other

## 2019-09-21 ENCOUNTER — Inpatient Hospital Stay (HOSPITAL_COMMUNITY)
Admission: RE | Admit: 2019-09-21 | Discharge: 2019-10-03 | DRG: 471 | Disposition: A | Discharge status: Rehabilitation Facility | Payer: Medicare Other | Attending: Neurosurgery | Admitting: Neurosurgery

## 2019-09-21 ENCOUNTER — Inpatient Hospital Stay (HOSPITAL_COMMUNITY): Payer: Medicare Other | Admitting: Physician Assistant

## 2019-09-21 DIAGNOSIS — J810 Acute pulmonary edema: Secondary | ICD-10-CM | POA: Diagnosis not present

## 2019-09-21 DIAGNOSIS — R338 Other retention of urine: Secondary | ICD-10-CM | POA: Diagnosis present

## 2019-09-21 DIAGNOSIS — N400 Enlarged prostate without lower urinary tract symptoms: Secondary | ICD-10-CM | POA: Diagnosis present

## 2019-09-21 DIAGNOSIS — M4802 Spinal stenosis, cervical region: Secondary | ICD-10-CM | POA: Diagnosis present

## 2019-09-21 DIAGNOSIS — J9811 Atelectasis: Secondary | ICD-10-CM | POA: Diagnosis not present

## 2019-09-21 DIAGNOSIS — E669 Obesity, unspecified: Secondary | ICD-10-CM | POA: Diagnosis present

## 2019-09-21 DIAGNOSIS — M4712 Other spondylosis with myelopathy, cervical region: Principal | ICD-10-CM | POA: Diagnosis present

## 2019-09-21 DIAGNOSIS — R0603 Acute respiratory distress: Secondary | ICD-10-CM | POA: Diagnosis not present

## 2019-09-21 DIAGNOSIS — N319 Neuromuscular dysfunction of bladder, unspecified: Secondary | ICD-10-CM | POA: Diagnosis present

## 2019-09-21 DIAGNOSIS — Z881 Allergy status to other antibiotic agents status: Secondary | ICD-10-CM

## 2019-09-21 DIAGNOSIS — Z20828 Contact with and (suspected) exposure to other viral communicable diseases: Secondary | ICD-10-CM | POA: Diagnosis present

## 2019-09-21 DIAGNOSIS — I252 Old myocardial infarction: Secondary | ICD-10-CM | POA: Diagnosis not present

## 2019-09-21 DIAGNOSIS — R0902 Hypoxemia: Secondary | ICD-10-CM

## 2019-09-21 DIAGNOSIS — J9601 Acute respiratory failure with hypoxia: Secondary | ICD-10-CM | POA: Diagnosis not present

## 2019-09-21 DIAGNOSIS — G992 Myelopathy in diseases classified elsewhere: Secondary | ICD-10-CM | POA: Diagnosis not present

## 2019-09-21 DIAGNOSIS — N401 Enlarged prostate with lower urinary tract symptoms: Secondary | ICD-10-CM | POA: Diagnosis present

## 2019-09-21 DIAGNOSIS — G4733 Obstructive sleep apnea (adult) (pediatric): Secondary | ICD-10-CM | POA: Diagnosis present

## 2019-09-21 DIAGNOSIS — I5030 Unspecified diastolic (congestive) heart failure: Secondary | ICD-10-CM | POA: Diagnosis present

## 2019-09-21 DIAGNOSIS — K592 Neurogenic bowel, not elsewhere classified: Secondary | ICD-10-CM | POA: Diagnosis not present

## 2019-09-21 DIAGNOSIS — Z8249 Family history of ischemic heart disease and other diseases of the circulatory system: Secondary | ICD-10-CM

## 2019-09-21 DIAGNOSIS — I10 Essential (primary) hypertension: Secondary | ICD-10-CM | POA: Diagnosis not present

## 2019-09-21 DIAGNOSIS — M50021 Cervical disc disorder at C4-C5 level with myelopathy: Secondary | ICD-10-CM | POA: Diagnosis present

## 2019-09-21 DIAGNOSIS — Z96652 Presence of left artificial knee joint: Secondary | ICD-10-CM | POA: Diagnosis present

## 2019-09-21 DIAGNOSIS — Z951 Presence of aortocoronary bypass graft: Secondary | ICD-10-CM

## 2019-09-21 DIAGNOSIS — Z886 Allergy status to analgesic agent status: Secondary | ICD-10-CM

## 2019-09-21 DIAGNOSIS — Z87442 Personal history of urinary calculi: Secondary | ICD-10-CM

## 2019-09-21 DIAGNOSIS — G8250 Quadriplegia, unspecified: Secondary | ICD-10-CM | POA: Diagnosis not present

## 2019-09-21 DIAGNOSIS — R0602 Shortness of breath: Secondary | ICD-10-CM

## 2019-09-21 DIAGNOSIS — J9600 Acute respiratory failure, unspecified whether with hypoxia or hypercapnia: Secondary | ICD-10-CM | POA: Diagnosis not present

## 2019-09-21 DIAGNOSIS — K219 Gastro-esophageal reflux disease without esophagitis: Secondary | ICD-10-CM | POA: Diagnosis present

## 2019-09-21 DIAGNOSIS — E785 Hyperlipidemia, unspecified: Secondary | ICD-10-CM | POA: Diagnosis present

## 2019-09-21 DIAGNOSIS — I2510 Atherosclerotic heart disease of native coronary artery without angina pectoris: Secondary | ICD-10-CM | POA: Diagnosis present

## 2019-09-21 DIAGNOSIS — R451 Restlessness and agitation: Secondary | ICD-10-CM | POA: Diagnosis not present

## 2019-09-21 DIAGNOSIS — D72829 Elevated white blood cell count, unspecified: Secondary | ICD-10-CM | POA: Diagnosis present

## 2019-09-21 DIAGNOSIS — G9340 Encephalopathy, unspecified: Secondary | ICD-10-CM | POA: Diagnosis not present

## 2019-09-21 DIAGNOSIS — G959 Disease of spinal cord, unspecified: Secondary | ICD-10-CM | POA: Diagnosis not present

## 2019-09-21 DIAGNOSIS — Z803 Family history of malignant neoplasm of breast: Secondary | ICD-10-CM

## 2019-09-21 DIAGNOSIS — M50121 Cervical disc disorder at C4-C5 level with radiculopathy: Secondary | ICD-10-CM | POA: Diagnosis present

## 2019-09-21 DIAGNOSIS — I110 Hypertensive heart disease with heart failure: Secondary | ICD-10-CM | POA: Diagnosis present

## 2019-09-21 DIAGNOSIS — K5900 Constipation, unspecified: Secondary | ICD-10-CM | POA: Diagnosis present

## 2019-09-21 DIAGNOSIS — F419 Anxiety disorder, unspecified: Secondary | ICD-10-CM | POA: Diagnosis present

## 2019-09-21 DIAGNOSIS — F411 Generalized anxiety disorder: Secondary | ICD-10-CM | POA: Diagnosis not present

## 2019-09-21 DIAGNOSIS — I4819 Other persistent atrial fibrillation: Secondary | ICD-10-CM | POA: Diagnosis not present

## 2019-09-21 DIAGNOSIS — I4891 Unspecified atrial fibrillation: Secondary | ICD-10-CM | POA: Diagnosis present

## 2019-09-21 DIAGNOSIS — N179 Acute kidney failure, unspecified: Secondary | ICD-10-CM | POA: Diagnosis not present

## 2019-09-21 DIAGNOSIS — R5381 Other malaise: Secondary | ICD-10-CM | POA: Diagnosis not present

## 2019-09-21 DIAGNOSIS — Z7901 Long term (current) use of anticoagulants: Secondary | ICD-10-CM | POA: Diagnosis not present

## 2019-09-21 DIAGNOSIS — R061 Stridor: Secondary | ICD-10-CM

## 2019-09-21 DIAGNOSIS — R001 Bradycardia, unspecified: Secondary | ICD-10-CM | POA: Diagnosis not present

## 2019-09-21 DIAGNOSIS — T380X5A Adverse effect of glucocorticoids and synthetic analogues, initial encounter: Secondary | ICD-10-CM | POA: Diagnosis present

## 2019-09-21 DIAGNOSIS — R221 Localized swelling, mass and lump, neck: Secondary | ICD-10-CM

## 2019-09-21 DIAGNOSIS — G4700 Insomnia, unspecified: Secondary | ICD-10-CM | POA: Diagnosis present

## 2019-09-21 DIAGNOSIS — J9690 Respiratory failure, unspecified, unspecified whether with hypoxia or hypercapnia: Secondary | ICD-10-CM

## 2019-09-21 DIAGNOSIS — Z79899 Other long term (current) drug therapy: Secondary | ICD-10-CM | POA: Diagnosis not present

## 2019-09-21 DIAGNOSIS — Z4789 Encounter for other orthopedic aftercare: Secondary | ICD-10-CM | POA: Diagnosis not present

## 2019-09-21 DIAGNOSIS — I5032 Chronic diastolic (congestive) heart failure: Secondary | ICD-10-CM | POA: Diagnosis not present

## 2019-09-21 DIAGNOSIS — Z419 Encounter for procedure for purposes other than remedying health state, unspecified: Secondary | ICD-10-CM

## 2019-09-21 HISTORY — PX: ANTERIOR CERVICAL DECOMP/DISCECTOMY FUSION: SHX1161

## 2019-09-21 SURGERY — ANTERIOR CERVICAL DECOMPRESSION/DISCECTOMY FUSION 3 LEVELS
Anesthesia: General

## 2019-09-21 MED ORDER — IRBESARTAN 150 MG PO TABS
300.0000 mg | ORAL_TABLET | Freq: Every day | ORAL | Status: DC
  Administered 2019-09-21 – 2019-09-22 (×2): 300 mg via ORAL
  Filled 2019-09-21 (×2): qty 1

## 2019-09-21 MED ORDER — FUROSEMIDE 20 MG PO TABS
20.0000 mg | ORAL_TABLET | Freq: Every day | ORAL | Status: DC
  Administered 2019-09-22 (×2): 20 mg via ORAL
  Filled 2019-09-21 (×2): qty 1

## 2019-09-21 MED ORDER — OXYCODONE HCL 5 MG PO TABS
10.0000 mg | ORAL_TABLET | ORAL | Status: DC | PRN
  Administered 2019-09-21 – 2019-10-03 (×7): 10 mg via ORAL
  Filled 2019-09-21 (×7): qty 2

## 2019-09-21 MED ORDER — MENTHOL 3 MG MT LOZG
1.0000 | LOZENGE | OROMUCOSAL | Status: DC | PRN
  Filled 2019-09-21: qty 9

## 2019-09-21 MED ORDER — MORPHINE SULFATE (PF) 4 MG/ML IV SOLN
4.0000 mg | INTRAVENOUS | Status: DC | PRN
  Administered 2019-09-21 – 2019-10-03 (×15): 4 mg via INTRAVENOUS
  Filled 2019-09-21 (×17): qty 1

## 2019-09-21 MED ORDER — ONDANSETRON HCL 4 MG/2ML IJ SOLN
4.0000 mg | Freq: Four times a day (QID) | INTRAMUSCULAR | Status: DC | PRN
  Administered 2019-10-01: 4 mg via INTRAVENOUS
  Filled 2019-09-21: qty 2

## 2019-09-21 MED ORDER — GLYCOPYRROLATE 0.2 MG/ML IJ SOLN
INTRAMUSCULAR | Status: DC | PRN
  Administered 2019-09-21: .4 mg via INTRAVENOUS

## 2019-09-21 MED ORDER — CYCLOBENZAPRINE HCL 10 MG PO TABS
10.0000 mg | ORAL_TABLET | Freq: Three times a day (TID) | ORAL | Status: DC | PRN
  Administered 2019-09-21 – 2019-09-30 (×3): 10 mg via ORAL
  Filled 2019-09-21 (×3): qty 1

## 2019-09-21 MED ORDER — DEXAMETHASONE 4 MG PO TABS: 4.0000 mg | ORAL_TABLET | Freq: Four times a day (QID) | ORAL | Status: AC

## 2019-09-21 MED ORDER — PANTOPRAZOLE SODIUM 40 MG IV SOLR: 40.0000 mg | Freq: Every day | INTRAVENOUS | Status: DC

## 2019-09-21 MED ORDER — SUCCINYLCHOLINE CHLORIDE 20 MG/ML IJ SOLN
INTRAMUSCULAR | Status: DC | PRN
  Administered 2019-09-21: 120 mg via INTRAVENOUS

## 2019-09-21 MED ORDER — ACETAMINOPHEN 650 MG RE SUPP: 650.0000 mg | RECTAL | Status: DC | PRN

## 2019-09-21 MED ORDER — MIDAZOLAM HCL 5 MG/5ML IJ SOLN
INTRAMUSCULAR | Status: DC | PRN
  Administered 2019-09-21: 2 mg via INTRAVENOUS

## 2019-09-21 MED ORDER — ROCURONIUM BROMIDE 10 MG/ML (PF) SYRINGE
PREFILLED_SYRINGE | INTRAVENOUS | Status: DC | PRN
  Administered 2019-09-21: 15 mg via INTRAVENOUS
  Administered 2019-09-21: 50 mg via INTRAVENOUS
  Administered 2019-09-21: 20 mg via INTRAVENOUS

## 2019-09-21 MED ORDER — MIDAZOLAM HCL 2 MG/2ML IJ SOLN
INTRAMUSCULAR | Status: AC
  Filled 2019-09-21: qty 2

## 2019-09-21 MED ORDER — SUGAMMADEX SODIUM 200 MG/2ML IV SOLN
INTRAVENOUS | Status: DC | PRN
  Administered 2019-09-21: 400 mg via INTRAVENOUS

## 2019-09-21 MED ORDER — LACTATED RINGERS IV SOLN: INTRAVENOUS | Status: DC

## 2019-09-21 MED ORDER — HYDRALAZINE HCL 50 MG PO TABS
75.0000 mg | ORAL_TABLET | Freq: Three times a day (TID) | ORAL | Status: DC
  Administered 2019-09-21 – 2019-10-03 (×32): 75 mg via ORAL
  Filled 2019-09-21 (×20): qty 1
  Filled 2019-09-21: qty 8
  Filled 2019-09-21: qty 1
  Filled 2019-09-21: qty 8
  Filled 2019-09-21 (×4): qty 1
  Filled 2019-09-21: qty 8
  Filled 2019-09-21: qty 1
  Filled 2019-09-21: qty 8
  Filled 2019-09-21: qty 1
  Filled 2019-09-21: qty 8
  Filled 2019-09-21: qty 1

## 2019-09-21 MED ORDER — DEXAMETHASONE SODIUM PHOSPHATE 10 MG/ML IJ SOLN
INTRAMUSCULAR | Status: AC
  Filled 2019-09-21: qty 1

## 2019-09-21 MED ORDER — BUPIVACAINE-EPINEPHRINE (PF) 0.5% -1:200000 IJ SOLN
INTRAMUSCULAR | Status: AC
  Filled 2019-09-21: qty 30

## 2019-09-21 MED ORDER — CHLORHEXIDINE GLUCONATE CLOTH 2 % EX PADS: 6.0000 | MEDICATED_PAD | Freq: Once | CUTANEOUS | Status: DC

## 2019-09-21 MED ORDER — 0.9 % SODIUM CHLORIDE (POUR BTL) OPTIME
TOPICAL | Status: DC | PRN
  Administered 2019-09-21: 12:00:00 1000 mL

## 2019-09-21 MED ORDER — METOPROLOL TARTRATE 25 MG PO TABS
25.0000 mg | ORAL_TABLET | Freq: Every day | ORAL | Status: DC
  Administered 2019-09-22: 25 mg via ORAL
  Filled 2019-09-21: qty 1

## 2019-09-21 MED ORDER — PROPOFOL 10 MG/ML IV BOLUS
INTRAVENOUS | Status: AC
  Filled 2019-09-21: qty 20

## 2019-09-21 MED ORDER — DEXAMETHASONE SODIUM PHOSPHATE 10 MG/ML IJ SOLN
INTRAMUSCULAR | Status: DC | PRN
  Administered 2019-09-21: 10 mg via INTRAVENOUS

## 2019-09-21 MED ORDER — EPHEDRINE 5 MG/ML INJ
INTRAVENOUS | Status: AC
  Filled 2019-09-21: qty 10

## 2019-09-21 MED ORDER — CEFAZOLIN SODIUM-DEXTROSE 2-4 GM/100ML-% IV SOLN
2.0000 g | Freq: Three times a day (TID) | INTRAVENOUS | Status: AC
  Administered 2019-09-21 – 2019-09-22 (×2): 2 g via INTRAVENOUS
  Filled 2019-09-21 (×2): qty 100

## 2019-09-21 MED ORDER — SPIRONOLACTONE 25 MG PO TABS
25.0000 mg | ORAL_TABLET | Freq: Every day | ORAL | Status: DC
  Administered 2019-09-22 – 2019-10-03 (×11): 25 mg via ORAL
  Filled 2019-09-21 (×12): qty 1

## 2019-09-21 MED ORDER — ONDANSETRON HCL 4 MG/2ML IJ SOLN
INTRAMUSCULAR | Status: AC
  Filled 2019-09-21: qty 2

## 2019-09-21 MED ORDER — ACETAMINOPHEN 500 MG PO TABS: 1000.0000 mg | ORAL_TABLET | Freq: Once | ORAL | Status: AC

## 2019-09-21 MED ORDER — CO Q 10 100 MG PO CAPS: 400.0000 mg | ORAL_CAPSULE | Freq: Every day | ORAL | Status: DC

## 2019-09-21 MED ORDER — DOCUSATE SODIUM 100 MG PO CAPS
100.0000 mg | ORAL_CAPSULE | Freq: Two times a day (BID) | ORAL | Status: DC
  Administered 2019-09-21 – 2019-10-03 (×16): 100 mg via ORAL
  Filled 2019-09-21 (×18): qty 1

## 2019-09-21 MED ORDER — BACITRACIN ZINC 500 UNIT/GM EX OINT
TOPICAL_OINTMENT | CUTANEOUS | Status: DC | PRN
  Administered 2019-09-21: 1 via TOPICAL

## 2019-09-21 MED ORDER — ACETAMINOPHEN 500 MG PO TABS
1000.0000 mg | ORAL_TABLET | Freq: Four times a day (QID) | ORAL | Status: AC
  Administered 2019-09-21 – 2019-09-22 (×4): 1000 mg via ORAL
  Filled 2019-09-21 (×4): qty 2

## 2019-09-21 MED ORDER — ATORVASTATIN CALCIUM 10 MG PO TABS
20.0000 mg | ORAL_TABLET | Freq: Every day | ORAL | Status: DC
  Administered 2019-09-21 – 2019-10-02 (×10): 20 mg via ORAL
  Filled 2019-09-21 (×10): qty 2

## 2019-09-21 MED ORDER — LIDOCAINE 2% (20 MG/ML) 5 ML SYRINGE
INTRAMUSCULAR | Status: DC | PRN
  Administered 2019-09-21: 100 mg via INTRAVENOUS

## 2019-09-21 MED ORDER — PHENYLEPHRINE HCL-NACL 10-0.9 MG/250ML-% IV SOLN
INTRAVENOUS | Status: DC | PRN
  Administered 2019-09-21: 50 ug/min via INTRAVENOUS

## 2019-09-21 MED ORDER — THROMBIN 5000 UNITS EX SOLR
OROMUCOSAL | Status: DC | PRN
  Administered 2019-09-21: 5 mL via TOPICAL

## 2019-09-21 MED ORDER — FENTANYL CITRATE (PF) 250 MCG/5ML IJ SOLN
INTRAMUSCULAR | Status: AC
  Filled 2019-09-21: qty 5

## 2019-09-21 MED ORDER — FENTANYL CITRATE (PF) 100 MCG/2ML IJ SOLN
25.0000 ug | INTRAMUSCULAR | Status: DC | PRN
  Administered 2019-09-21 (×3): 50 ug via INTRAVENOUS

## 2019-09-21 MED ORDER — OXYCODONE HCL 5 MG PO TABS
5.0000 mg | ORAL_TABLET | ORAL | Status: DC | PRN
  Administered 2019-09-22 – 2019-10-02 (×11): 5 mg via ORAL
  Filled 2019-09-21 (×10): qty 1

## 2019-09-21 MED ORDER — BUPIVACAINE-EPINEPHRINE (PF) 0.5% -1:200000 IJ SOLN
INTRAMUSCULAR | Status: DC | PRN
  Administered 2019-09-21: 20 mL

## 2019-09-21 MED ORDER — THROMBIN 20000 UNITS EX SOLR
CUTANEOUS | Status: AC
  Filled 2019-09-21: qty 20000

## 2019-09-21 MED ORDER — FENTANYL CITRATE (PF) 100 MCG/2ML IJ SOLN: 50.0000 ug | Freq: Once | INTRAMUSCULAR | Status: AC

## 2019-09-21 MED ORDER — FENTANYL CITRATE (PF) 100 MCG/2ML IJ SOLN
INTRAMUSCULAR | Status: AC
  Filled 2019-09-21: qty 2

## 2019-09-21 MED ORDER — ONDANSETRON HCL 4 MG PO TABS: 4.0000 mg | ORAL_TABLET | Freq: Four times a day (QID) | ORAL | Status: DC | PRN

## 2019-09-21 MED ORDER — THROMBIN 5000 UNITS EX SOLR
CUTANEOUS | Status: AC
  Filled 2019-09-21: qty 5000

## 2019-09-21 MED ORDER — ACETAMINOPHEN 500 MG PO TABS
ORAL_TABLET | ORAL | Status: AC
  Administered 2019-09-21: 1000 mg via ORAL
  Filled 2019-09-21: qty 2

## 2019-09-21 MED ORDER — SODIUM CHLORIDE 0.9 % IV SOLN
INTRAVENOUS | Status: DC | PRN
  Administered 2019-09-21: 500 mL

## 2019-09-21 MED ORDER — EPHEDRINE SULFATE 50 MG/ML IJ SOLN
INTRAMUSCULAR | Status: DC | PRN
  Administered 2019-09-21 (×4): 10 mg via INTRAVENOUS

## 2019-09-21 MED ORDER — PHENOL 1.4 % MT LIQD: 1.0000 | OROMUCOSAL | Status: DC | PRN

## 2019-09-21 MED ORDER — ONDANSETRON HCL 4 MG/2ML IJ SOLN
INTRAMUSCULAR | Status: DC | PRN
  Administered 2019-09-21: 4 mg via INTRAVENOUS

## 2019-09-21 MED ORDER — PANTOPRAZOLE SODIUM 40 MG PO TBEC
40.0000 mg | DELAYED_RELEASE_TABLET | Freq: Every day | ORAL | Status: DC
  Administered 2019-09-22 – 2019-10-03 (×11): 40 mg via ORAL
  Filled 2019-09-21 (×12): qty 1

## 2019-09-21 MED ORDER — ACETAMINOPHEN 325 MG PO TABS
650.0000 mg | ORAL_TABLET | ORAL | Status: DC | PRN
  Administered 2019-09-24 – 2019-09-25 (×3): 650 mg via ORAL
  Filled 2019-09-21 (×4): qty 2

## 2019-09-21 MED ORDER — FENTANYL CITRATE (PF) 250 MCG/5ML IJ SOLN
INTRAMUSCULAR | Status: DC | PRN
  Administered 2019-09-21: 100 ug via INTRAVENOUS
  Administered 2019-09-21: 50 ug via INTRAVENOUS
  Administered 2019-09-21: 100 ug via INTRAVENOUS

## 2019-09-21 MED ORDER — BISACODYL 10 MG RE SUPP
10.0000 mg | Freq: Every day | RECTAL | Status: DC | PRN
  Filled 2019-09-21: qty 1

## 2019-09-21 MED ORDER — BACITRACIN ZINC 500 UNIT/GM EX OINT
TOPICAL_OINTMENT | CUTANEOUS | Status: AC
  Filled 2019-09-21: qty 28.35

## 2019-09-21 MED ORDER — ALUM & MAG HYDROXIDE-SIMETH 200-200-20 MG/5ML PO SUSP: 30.0000 mL | Freq: Four times a day (QID) | ORAL | Status: DC | PRN

## 2019-09-21 MED ORDER — CYCLOBENZAPRINE HCL 10 MG PO TABS
ORAL_TABLET | ORAL | Status: AC
  Filled 2019-09-21: qty 1

## 2019-09-21 MED ORDER — CEFAZOLIN SODIUM-DEXTROSE 2-4 GM/100ML-% IV SOLN
INTRAVENOUS | Status: AC
  Filled 2019-09-21: qty 100

## 2019-09-21 MED ORDER — DILTIAZEM HCL ER COATED BEADS 180 MG PO CP24
180.0000 mg | ORAL_CAPSULE | Freq: Every day | ORAL | Status: DC
  Administered 2019-09-22 – 2019-09-27 (×5): 180 mg via ORAL
  Filled 2019-09-21 (×7): qty 1

## 2019-09-21 MED ORDER — LORAZEPAM 1 MG PO TABS
0.5000 mg | ORAL_TABLET | Freq: Three times a day (TID) | ORAL | Status: DC
  Administered 2019-09-21 – 2019-10-01 (×22): 0.5 mg via ORAL
  Filled 2019-09-21 (×5): qty 1
  Filled 2019-09-21: qty 0.5
  Filled 2019-09-21 (×8): qty 1
  Filled 2019-09-21: qty 0.5
  Filled 2019-09-21 (×10): qty 1

## 2019-09-21 MED ORDER — PHENYLEPHRINE 40 MCG/ML (10ML) SYRINGE FOR IV PUSH (FOR BLOOD PRESSURE SUPPORT)
PREFILLED_SYRINGE | INTRAVENOUS | Status: AC
  Filled 2019-09-21: qty 10

## 2019-09-21 MED ORDER — SERTRALINE HCL 50 MG PO TABS
50.0000 mg | ORAL_TABLET | Freq: Every day | ORAL | Status: DC
  Administered 2019-09-22 – 2019-10-03 (×11): 50 mg via ORAL
  Filled 2019-09-21 (×11): qty 1

## 2019-09-21 MED ORDER — FENTANYL CITRATE (PF) 100 MCG/2ML IJ SOLN
INTRAMUSCULAR | Status: AC
  Administered 2019-09-21: 50 ug via INTRAVENOUS
  Filled 2019-09-21: qty 2

## 2019-09-21 MED ORDER — DEXAMETHASONE SODIUM PHOSPHATE 4 MG/ML IJ SOLN
4.0000 mg | Freq: Four times a day (QID) | INTRAMUSCULAR | Status: AC
  Administered 2019-09-21 (×2): 4 mg via INTRAVENOUS
  Filled 2019-09-21 (×2): qty 1

## 2019-09-21 MED ORDER — CEFAZOLIN SODIUM-DEXTROSE 2-4 GM/100ML-% IV SOLN
2.0000 g | INTRAVENOUS | Status: AC
  Administered 2019-09-21: 2 g via INTRAVENOUS

## 2019-09-21 MED ORDER — OXYCODONE HCL 5 MG PO TABS
ORAL_TABLET | ORAL | Status: AC
  Filled 2019-09-21: qty 2

## 2019-09-21 MED ORDER — PROPOFOL 10 MG/ML IV BOLUS
INTRAVENOUS | Status: DC | PRN
  Administered 2019-09-21: 50 mg via INTRAVENOUS
  Administered 2019-09-21: 100 mg via INTRAVENOUS

## 2019-09-21 SURGICAL SUPPLY — 59 items
BAG DECANTER FOR FLEXI CONT (MISCELLANEOUS) ×3 IMPLANT
BENZOIN TINCTURE PRP APPL 2/3 (GAUZE/BANDAGES/DRESSINGS) ×4 IMPLANT
BIT DRILL NEURO 2X3.1 SFT TUCH (MISCELLANEOUS) ×1 IMPLANT
BLADE SURG 15 STRL LF DISP TIS (BLADE) ×1 IMPLANT
BLADE SURG 15 STRL SS (BLADE) ×2
BLADE ULTRA TIP 2M (BLADE) ×3 IMPLANT
BUR BARREL STRAIGHT FLUTE 4.0 (BURR) ×3 IMPLANT
BUR MATCHSTICK NEURO 3.0 LAGG (BURR) ×3 IMPLANT
CANISTER SUCT 3000ML PPV (MISCELLANEOUS) ×3 IMPLANT
CARTRIDGE OIL MAESTRO DRILL (MISCELLANEOUS) ×1 IMPLANT
CLOSURE WOUND 1/2 X4 (GAUZE/BANDAGES/DRESSINGS) ×1
COVER MAYO STAND STRL (DRAPES) ×3 IMPLANT
COVER WAND RF STERILE (DRAPES) ×1 IMPLANT
DIFFUSER DRILL AIR PNEUMATIC (MISCELLANEOUS) ×3 IMPLANT
DRAPE LAPAROTOMY 100X72 PEDS (DRAPES) ×3 IMPLANT
DRAPE MICROSCOPE LEICA (MISCELLANEOUS) IMPLANT
DRAPE POUCH INSTRU U-SHP 10X18 (DRAPES) ×3 IMPLANT
DRAPE SURG 17X23 STRL (DRAPES) ×6 IMPLANT
DRILL NEURO 2X3.1 SOFT TOUCH (MISCELLANEOUS) ×3
DRSG OPSITE POSTOP 4X6 (GAUZE/BANDAGES/DRESSINGS) ×2 IMPLANT
ELECT BLADE 4.0 EZ CLEAN MEGAD (MISCELLANEOUS) ×3
ELECT REM PT RETURN 9FT ADLT (ELECTROSURGICAL) ×3
ELECTRODE BLDE 4.0 EZ CLN MEGD (MISCELLANEOUS) IMPLANT
ELECTRODE REM PT RTRN 9FT ADLT (ELECTROSURGICAL) ×1 IMPLANT
GAUZE 4X4 16PLY RFD (DISPOSABLE) IMPLANT
GAUZE SPONGE 4X4 12PLY STRL (GAUZE/BANDAGES/DRESSINGS) ×3 IMPLANT
GLOVE BIO SURGEON STRL SZ8 (GLOVE) ×3 IMPLANT
GLOVE BIO SURGEON STRL SZ8.5 (GLOVE) ×3 IMPLANT
GLOVE EXAM NITRILE XL STR (GLOVE) IMPLANT
GOWN STRL REUS W/ TWL LRG LVL3 (GOWN DISPOSABLE) IMPLANT
GOWN STRL REUS W/ TWL XL LVL3 (GOWN DISPOSABLE) ×1 IMPLANT
GOWN STRL REUS W/TWL LRG LVL3 (GOWN DISPOSABLE)
GOWN STRL REUS W/TWL XL LVL3 (GOWN DISPOSABLE) ×2
HEMOSTAT POWDER KIT SURGIFOAM (HEMOSTASIS) ×3 IMPLANT
KIT BASIN OR (CUSTOM PROCEDURE TRAY) ×3 IMPLANT
KIT TURNOVER KIT B (KITS) ×3 IMPLANT
MARKER SKIN DUAL TIP RULER LAB (MISCELLANEOUS) ×3 IMPLANT
NDL SPNL 18GX3.5 QUINCKE PK (NEEDLE) ×1 IMPLANT
NEEDLE HYPO 22GX1.5 SAFETY (NEEDLE) ×3 IMPLANT
NEEDLE SPNL 18GX3.5 QUINCKE PK (NEEDLE) ×3 IMPLANT
NS IRRIG 1000ML POUR BTL (IV SOLUTION) ×3 IMPLANT
OIL CARTRIDGE MAESTRO DRILL (MISCELLANEOUS) ×3
PACK LAMINECTOMY NEURO (CUSTOM PROCEDURE TRAY) ×3 IMPLANT
PEEK VISTA 14X14X7MM (Peek) ×6 IMPLANT
PIN DISTRACTION 14MM (PIN) ×6 IMPLANT
PLATE ANT CERV XTEND 3 LV 48 (Plate) ×2 IMPLANT
PUTTY DBM 5CC CALC GRAN ×2 IMPLANT
RUBBERBAND STERILE (MISCELLANEOUS) IMPLANT
SCREW XTD VAR 4.2 SELF TAP (Screw) ×16 IMPLANT
SPONGE INTESTINAL PEANUT (DISPOSABLE) ×4 IMPLANT
SPONGE SURGIFOAM ABS GEL 100 (HEMOSTASIS) IMPLANT
STRIP CLOSURE SKIN 1/2X4 (GAUZE/BANDAGES/DRESSINGS) ×2 IMPLANT
SUT VIC AB 0 CT1 27 (SUTURE) ×2
SUT VIC AB 0 CT1 27XBRD ANTBC (SUTURE) ×1 IMPLANT
SUT VIC AB 3-0 SH 8-18 (SUTURE) ×5 IMPLANT
TOWEL GREEN STERILE (TOWEL DISPOSABLE) ×3 IMPLANT
TOWEL GREEN STERILE FF (TOWEL DISPOSABLE) ×3 IMPLANT
TRAY FOLEY MTR SLVR 16FR STAT (SET/KITS/TRAYS/PACK) ×2 IMPLANT
WATER STERILE IRR 1000ML POUR (IV SOLUTION) ×3 IMPLANT

## 2019-09-21 NOTE — Op Note (Signed)
Brief history: The patient is a 68 year old white male who presented with neck pain, Quadraparesis, gait instability, etc.  He was worked up with a cervical MRI which demonstrated a large herniated disks at C4-5 with severe spinal stenosis and spinal cord signal change.  He had more moderate stenosis at C3-4 and C5-6.  I discussed the various treatment options with him and recommended surgery.  The patient has weighed the risks, benefits and alternatives and decided proceed with C3-4, C4-5 and C5-6 anterior cervical discectomy, fusion and plating.  Preoperative diagnosis: Cervical herniated disc, cervical spondylosis, cervical stenosis, cervical neuroforaminal stenosis, cervicalgia, cervical radiculopathy, cervical myelopathy  Postoperative diagnosis: The same  Procedure: C3-4, C4-5 and C5-6 anterior cervical discectomy/decompression; C3-4, C4-5 and C5-6 interbody arthrodesis with local morcellized autograft bone and Zimmer DBM; insertion of interbody prosthesis at C3-4, C4-5 and C5-6 (Zimmer peek interbody prosthesis); anterior cervical plating from C3-C6 with globus titanium plate  Surgeon: Dr. Delma Officer  Asst.: Hildred Priest nurse practitioner  Anesthesia: Gen. endotracheal  Estimated blood loss: 200 cc  Drains: None  Complications: None  Description of procedure: The patient was brought to the operating room by the anesthesia team. General endotracheal anesthesia was induced. A roll was placed under the patient's shoulders to keep the neck in the neutral position. The patient's anterior cervical region was then prepared with Betadine scrub and Betadine solution. Sterile drapes were applied.  The area to be incised was then injected with Marcaine with epinephrine solution. I then used a scalpel to make a transverse incision in the patient's left anterior neck. I used the Metzenbaum scissors to divide the platysmal muscle and then to dissect medial to the sternocleidomastoid muscle,  jugular vein, and carotid artery. I carefully dissected down towards the anterior cervical spine identifying the esophagus and retracting it medially. Then using Kitner swabs to clear soft tissue from the anterior cervical spine. We then inserted a bent spinal needle into the upper exposed intervertebral disc space. We then obtained intraoperative radiographs confirm our location.  I then used electrocautery to detach the medial border of the longus colli muscle bilaterally from the C3-4, C4-5 and C5-6 intervertebral disc spaces. I then inserted the Caspar self-retaining retractor underneath the longus colli muscle bilaterally to provide exposure.  We then incised the intervertebral disc at C4-5. We then performed a partial intervertebral discectomy with a pituitary forceps and the Karlin curettes. I then inserted distraction screws into the vertebral bodies at C4 and C5. We then distracted the interspace. We then used the high-speed drill to decorticate the vertebral endplates at C4-5, to drill away the remainder of the intervertebral disc, to drill away some posterior spondylosis, and to thin out the posterior longitudinal ligament. I then incised ligament with the arachnoid knife.  We encountered a large herniated disc which removed with the pituitary forceps and the Kerrison punches.  We then removed the ligament with a Kerrison punches undercutting the vertebral endplates and decompressing the thecal sac. We then performed foraminotomies about the bilateral C5 nerve roots. This completed the decompression at this level.  We then repeated this procedure in an analogous fashion at C3-4 and C5-6 decompressing the thecal sac and the bilateral C4 and C6 nerve roots.  We now turned our to attention to the interbody fusion. We used the trial spacers to determine the appropriate size for the interbody prosthesis. We then pre-filled prosthesis with a combination of local morcellized autograft bone that we  obtained during decompression as well as Zimmer DBM.  We then inserted the prosthesis into the distracted interspace at C3-4, C4-5 and C5-6. We then removed the distraction screws. There was a good snug fit of the prosthesis in the interspace.  Having completed the fusion we now turned attention to the anterior spinal instrumentation. We used the high-speed drill to drill away some anterior spondylosis at the disc spaces so that the plate lay down flat. We selected the appropriate length titanium anterior cervical plate. We laid it along the anterior aspect of the vertebral bodies from C3-C6. We then drilled 14 mm holes at C3, C4, C5 and C6. We then secured the plate to the vertebral bodies by placing two 14 mm self-tapping screws at C3, C4, C5 and C6. We then obtained intraoperative radiograph.  There was very limited visualization of the instrumentation because of the patient's body habitus but the upper plate and screws looked to be in good position.  The instrumentation looked good in vivo.  We therefore secured the screws the plate the locking each cam. This completed the instrumentation.  We then obtained hemostasis using bipolar electrocautery. We irrigated the wound out with bacitracin solution. We then removed the retractor. We inspected the esophagus for any damage. There was none apparent. We then reapproximated patient's platysmal muscle with interrupted 3-0 Vicryl suture. We then reapproximated the subcutaneous tissue with interrupted 3-0 Vicryl suture. The skin was reapproximated with Steri-Strips and benzoin. The wound was then covered with bacitracin ointment. A sterile dressing was applied. The drapes were removed. Patient was subsequently extubated by the anesthesia team and transported to the post anesthesia care unit in stable condition. All sponge instrument and needle counts were reportedly correct at the end of this case.

## 2019-09-21 NOTE — Progress Notes (Signed)
Subjective: The patient is alert and pleasant.  He is in no apparent distress.  He complains of right shoulder pain.  Objective: Vital signs in last 24 hours: Temp:  [97.8 F (36.6 C)-98.2 F (36.8 C)] 97.8 F (36.6 C) (12/17 1440) Pulse Rate:  [58-81] 81 (12/17 1609) Resp:  [17-26] 21 (12/17 1539) BP: (141-169)/(67-132) 154/81 (12/17 1609) SpO2:  [90 %-98 %] 93 % (12/17 1609) Arterial Line BP: (154-175)/(65-74) 155/67 (12/17 1539) Weight:  [109.3 kg] 109.3 kg (12/17 0809) Estimated body mass index is 38.9 kg/m as calculated from the following:   Height as of this encounter: 5\' 6"  (1.676 m).   Weight as of this encounter: 109.3 kg.   Intake/Output from previous day: No intake/output data recorded. Intake/Output this shift: Total I/O In: 1100 [I.V.:1100] Out: 980 [Urine:880; Blood:100]  Physical exam the patient is alert and pleasant.  He is moving all 4 extremities.  He has weakness in his right deltoid at approximately 2-3 over 5.  The patient's dressing is clean and dry.  There is no hematoma or shift.  Lab Results: Recent Labs    09/19/19 1030  WBC 9.8  HGB 15.2  HCT 45.2  PLT 312   BMET Recent Labs    09/19/19 1030  NA 138  K 3.9  CL 105  CO2 25  GLUCOSE 99  BUN 12  CREATININE 0.74  CALCIUM 9.8    Studies/Results: DG Cervical Spine 2-3 Views  Result Date: 09/21/2019 CLINICAL DATA:  C3-C6 ACDF. EXAM: CERVICAL SPINE - 2-3 VIEW COMPARISON:  MRI cervical spine dated September 02, 2019. FINDINGS: Two lateral intraoperative x-rays submitted for review. Film #1 demonstrates surgical localizer at C3-C4. Film #2 demonstrates completion C3-C6 ACDF. IMPRESSION: Intraoperative localization for C3-C6 ACDF. Electronically Signed   By: Titus Dubin M.D.   On: 09/21/2019 14:17    Assessment/Plan: Cervical hernia disc, cervical myelopathy, right C5 neuropraxia: The patient has some weakness in his right shoulder which is not particularly surprising given his large  herniated disc at C4-5 and recent surgery.  There are no other signs of progressive weakness to suggest a epidural hematoma.  We will continue with observation.  I have spoken to his wife.  LOS: 1 day     Ophelia Charter 09/21/2019, 4:22 PM

## 2019-09-21 NOTE — Transfer of Care (Signed)
Immediate Anesthesia Transfer of Care Note  Patient: [name] [name]  Procedure(s) Performed: ANTERIOR CERVICAL DECOMPRESSION/DISCECTOMY FUSION CERVICAL THREE- CERVICAL FOUR, CERVICAL FOUR- CERVICAL FIVE, CERVICAL FIVE- CERVICAL SIX ([name] )  Patient Location: PACU  Anesthesia Type:General  Level of Consciousness: awake, drowsy and patient cooperative  Airway & Oxygen Therapy: Patient Spontanous Breathing and Patient connected to face mask oxygen  Post-op Assessment: Report given to RN, Post -op Vital signs reviewed and stable and Patient moving all extremities X 4  Post vital signs: Reviewed and stable  Last Vitals:  Vitals Value Taken Time  BP 159/73 09/21/19 1440  Temp 36.6 C 09/21/19 1440  Pulse 75 09/21/19 1451  Resp 24 09/21/19 1451  SpO2 95 % 09/21/19 1451  Vitals shown include unvalidated device data.  Last Pain:  Vitals:   09/21/19 1440  TempSrc:   PainSc: Asleep         Complications: No apparent anesthesia complications

## 2019-09-21 NOTE — Anesthesia Postprocedure Evaluation (Signed)
Anesthesia Post Note  Patient: [name] [name]  Procedure(s) Performed: ANTERIOR CERVICAL DECOMPRESSION/DISCECTOMY FUSION CERVICAL THREE- CERVICAL FOUR, CERVICAL FOUR- CERVICAL FIVE, CERVICAL FIVE- CERVICAL SIX ([name] )     Patient location during evaluation: PACU Anesthesia Type: General Level of consciousness: awake and alert Pain management: pain level controlled Vital Signs Assessment: post-procedure vital signs reviewed and stable Respiratory status: spontaneous breathing, nonlabored ventilation, respiratory function stable and patient connected to nasal cannula oxygen Cardiovascular status: blood pressure returned to baseline and stable Postop Assessment: no apparent nausea or vomiting Anesthetic complications: no    Last Vitals:  Vitals:   09/21/19 1609 09/21/19 1645  BP: (!) 154/81 (!) 157/79  Pulse: 81 76  Resp:  17  Temp:  36.5 C  SpO2: 93% 93%    Last Pain:  Vitals:   09/21/19 1645  TempSrc: Oral  PainSc:                  Catalina Gravel

## 2019-09-21 NOTE — Anesthesia Procedure Notes (Signed)
Procedure Name: Intubation Date/Time: 09/21/2019 10:57 AM Performed by: Mariea Clonts, CRNA Pre-anesthesia Checklist: Patient identified, Emergency Drugs available, Suction available and Patient being monitored Patient Re-evaluated:Patient Re-evaluated prior to induction Oxygen Delivery Method: Circle System Utilized Preoxygenation: Pre-oxygenation with 100% oxygen Induction Type: IV induction Ventilation: Mask ventilation without difficulty Laryngoscope Size: Glidescope and 4 Grade View: Grade I Tube type: Oral Tube size: 7.5 mm Number of attempts: 1 Airway Equipment and Method: Stylet and Oral airway Placement Confirmation: ETT inserted through vocal cords under direct vision,  positive ETCO2 and breath sounds checked- equal and bilateral Tube secured with: Tape Dental Injury: Teeth and Oropharynx as per pre-operative assessment

## 2019-09-21 NOTE — H&P (Signed)
Subjective: The patient is a 68 year old white male who has presented with severe neck pain and Quadraparesis.  He was worked up with a cervical MRI which demonstrated significant narrowing at C3-4, C4-5 and C5-6.  I discussed the various treatment options with him.  He has decided to proceed with surgery.  Past Medical History:  Diagnosis Date  . Anxiety   . CHF (congestive heart failure) (HCC)    h/o due to fluid overload in recovery room  . Coronary artery disease    2v CABG, 2004 [facility])  . COVID-19   . DJD (degenerative joint disease)   . Dyspnea    on exertion  . GERD (gastroesophageal reflux disease)   . History of kidney stones    3 stones yrs. ago  . HLD (hyperlipidemia)   . Hypertension   . MI, old 2004  . OA (osteoarthritis)   . Obesity   . Persistent atrial fibrillation (HCC)    failed medical therapy with sotalol, s/p PVI x 2  . Sleep apnea    on CPAP  . Spinal stenosis   . Typical atrial flutter [facility])     Past Surgical History:  Procedure Laterality Date  . ATRIAL FIBRILLATION ABLATION [name] 05/24/2018   Procedure: ATRIAL FIBRILLATION ABLATION;  Surgeon: Hillis Range, MD;  Location: [facility] INVASIVE CV LAB;  Service: Cardiovascular;  Laterality: [name];  . BACK SURGERY  2011  . CARPAL TUNNEL RELEASE     bilateral  . CHOLECYSTECTOMY    . CORONARY ARTERY BYPASS GRAFT     2004  . ELECTROPHYSIOLOGIC STUDY [name] 09/15/2016   Procedure: Atrial Fibrillation Ablation;  Surgeon: Hillis Range, MD;  Location: [facility] INVASIVE CV LAB;  Service: Cardiovascular;  Laterality: [name];  . fistula surgery    . hemorrhoidectomy    . LEFT HEART CATH AND CORS/GRAFTS ANGIOGRAPHY [name] 02/07/2018   Procedure: LEFT HEART CATH AND CORS/GRAFTS ANGIOGRAPHY;  Surgeon: [name], Peter M, MD;  Location: [facility] INVASIVE CV LAB;  Service: Cardiovascular;  Laterality: [name];  . LUMBAR LAMINECTOMY    . right foot fracture    . TEE WITHOUT CARDIOVERSION [name] 09/15/2016   Procedure: TRANSESOPHAGEAL ECHOCARDIOGRAM (TEE);   Surgeon: Lewayne Bunting, MD;  Location: [facility] ENDOSCOPY;  Service: Cardiovascular;  Laterality: [name];  . TOTAL KNEE ARTHROPLASTY Left 07/07/2017   Procedure: LEFT TOTAL KNEE ARTHROPLASTY;  Surgeon: Ranee Gosselin, MD;  Location: [facility] ORS;  Service: Orthopedics;  Laterality: Left;  . TRANSTHORACIC ECHOCARDIOGRAM  08/25/2010   EF 55-60%    Allergies  Allergen Reactions  . Vancomycin Anaphylaxis, Shortness Of Breath and Other (See Comments)    Was informed after heart surgery Breathing problem patient unsure  . Aldactone [Spironolactone]     GYNECOMASTIA   . Chlorthalidone     GOUT  . Nsaids Other (See Comments)    On blood thinner    Social History   Tobacco Use  . Smoking status: Never Smoker  . Smokeless tobacco: Never Used  Substance Use Topics  . Alcohol use: Yes    Alcohol/week: 0.0 standard drinks    Comment: ocassional    Family History  Problem Relation Age of Onset  . Breast cancer Mother   . Hypertension Mother   . Coronary artery disease Father   . Stroke Father   . Hypertension Father    Prior to Admission medications   Medication Sig Start Date End Date Taking? Authorizing Provider  acetaminophen (TYLENOL) 500 MG tablet Take 1,000 mg by mouth every 6 (six) hours as needed  for moderate pain.   Yes [provider]  apixaban (ELIQUIS) 5 MG TABS tablet Take 1 tablet (5 mg total) by mouth 2 (two) times daily. 03/17/19  Yes Antoine Poche, MD  atorvastatin (LIPITOR) 20 MG tablet TAKE 1 TABLET BY MOUTH EVERY DAY 09/21/19  Yes Branch, Dorothe Pea, MD  Coenzyme Q10 (CO Q 10 PO) Take 400 mg by mouth daily.    Yes [provider]  diltiazem (CARDIZEM CD) 180 MG 24 hr capsule Take 1 capsule (180 mg total) by mouth daily. 03/17/19 07/31/20 Yes Branch, Dorothe Pea, MD  eplerenone (INSPRA) 25 MG tablet TAKE 1 TABLET BY MOUTH TWICE A DAY Patient taking differently: Take 25 mg by mouth 2 (two) times daily.  07/07/19  Yes Antoine Poche, MD  furosemide (LASIX)  20 MG tablet TAKE 1 TABLET BY MOUTH DAILY AS NEEDED FOR SWELLING/WEIGHT GAIN 09/21/19  Yes Branch, Dorothe Pea, MD  hydrALAZINE (APRESOLINE) 50 MG tablet Take 1.5 tablets (75 mg total) by mouth 3 (three) times daily. 09/12/19  Yes Branch, Dorothe Pea, MD  irbesartan (AVAPRO) 300 MG tablet Take 1 tablet (300 mg total) by mouth daily. Patient taking differently: Take 300 mg by mouth at bedtime.  09/13/19  Yes Branch, Dorothe Pea, MD  LORazepam (ATIVAN) 0.5 MG tablet Take 1 tablet (0.5 mg total) by mouth 3 (three) times daily. 07/20/19  Yes Ivery Quale, PA-C  metoprolol tartrate (LOPRESSOR) 25 MG tablet TAKE 1 TABLET (25 MG TOTAL) BY MOUTH EVERY 8 (EIGHT) HOURS AS NEEDED (A-FIB). Patient taking differently: Take 25 mg by mouth daily.  07/07/19  Yes BranchDorothe Pea, MD  Multiple Vitamin (MULTIVITAMIN) tablet Take 1 tablet by mouth daily.   Yes [provider]  omeprazole (PRILOSEC) 20 MG capsule TAKE 1 CAPSULE BY MOUTH EVERY OTHER DAY. Patient taking differently: Take 20 mg by mouth every other day.  11/01/18  Yes BranchDorothe Pea, MD  PRESCRIPTION MEDICATION Pt uses CPAP machine at bedtime   Yes [provider]  sertraline (ZOLOFT) 50 MG tablet Take 50 mg by mouth daily.   Yes [provider]  nitroGLYCERIN (NITROSTAT) 0.4 MG SL tablet Place 1 tablet (0.4 mg total) under the tongue every 5 (five) minutes x 3 doses as needed for chest pain. Patient not taking: Reported on 09/13/2019 02/07/18   Laverda Page B, NP     Review of Systems  Positive ROS: As above  All other systems have been reviewed and were otherwise negative with the exception of those mentioned in the HPI and as above.  Objective: Vital signs in last 24 hours: Temp:  [98.2 F (36.8 C)] 98.2 F (36.8 C) (12/17 0809) Pulse Rate:  [58] 58 (12/17 0809) Resp:  [17] 17 (12/17 0809) BP: (169)/(74) 169/74 (12/17 0809) SpO2:  [98 %] 98 % (12/17 0809) Weight:  [109.3 kg] 109.3 kg (12/17 0809) Estimated  body mass index is 38.9 kg/m as calculated from the following:   Height as of this encounter: 5\' 6"  (1.676 m).   Weight as of this encounter: 109.3 kg.   General Appearance: Alert, obese head: Normocephalic, without obvious abnormality, atraumatic Eyes: PERRL, conjunctiva/corneas clear, EOM's intact,    Ears: Normal  Throat: Normal  Neck: Supple, obese Back: unremarkable Lungs: Clear to auscultation bilaterally, respirations unlabored Heart: Regular rate and rhythm, no murmur, rub or gallop Abdomen: Soft, non-tender Extremities: Extremities normal, atraumatic, no cyanosis or edema Skin: unremarkable  NEUROLOGIC:   Mental status: alert and oriented,Motor Exam -the patient  has weakness in his bilateral wrist extensors, left deltoid, bicep, tricep, hip flexor and knee extensor.  He has nonsustained clonus.  He has an unsteady gait. Sensory Exam -hand numbness Reflexes:  Coordination - grossly normal Gait -unsteady Balance - grossly normal Cranial Nerves: I: smell Not tested  II: visual acuity  OS: Normal  OD: Normal   II: visual fields Full to confrontation  II: pupils Equal, round, reactive to light  III,VII: ptosis None  III,IV,VI: extraocular muscles  Full ROM  V: mastication Normal  V: facial light touch sensation  Normal  V,VII: corneal reflex  Present  VII: facial muscle function - upper  Normal  VII: facial muscle function - lower Normal  VIII: hearing Not tested  IX: soft palate elevation  Normal  IX,X: gag reflex Present  XI: trapezius strength  5/5  XI: sternocleidomastoid strength 5/5  XI: neck flexion strength  5/5  XII: tongue strength  Normal    Data Review Lab Results  Component Value Date   WBC 9.8 09/19/2019   HGB 15.2 09/19/2019   HCT 45.2 09/19/2019   MCV 87.4 09/19/2019   PLT 312 09/19/2019   Lab Results  Component Value Date   NA 138 09/19/2019   K 3.9 09/19/2019   CL 105 09/19/2019   CO2 25 09/19/2019   BUN 12 09/19/2019   CREATININE  0.74 09/19/2019   GLUCOSE 99 09/19/2019   Lab Results  Component Value Date   INR 1.14 06/30/2017    Assessment/Plan: C3-4, 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  I reviewed his imaging studies with him and pointed out the abnormalities.  We have discussed the various treatment options.  I have recommended a C3-4, C4-5 and C5-6 anterior cervical discectomy, fusion and plating.  I have described the surgery to him.  I have shown him surgical models.  I have given him a surgical pamphlet.  We have discussed the risks, benefits, alternatives, expected postoperative course, and likelihood of achieving our goals with surgery.  I have answered all his questions.  He has decided to proceed with surgery.   Ophelia Charter 09/21/2019 10:37 AM

## 2019-09-21 NOTE — Anesthesia Procedure Notes (Signed)
Arterial Line Insertion Start/End12/17/2020 10:00 AM, 09/21/2019 10:10 AM Performed by: CRNA  Patient location: Pre-op. Preanesthetic checklist: patient identified, IV checked, site marked, risks and benefits discussed, surgical consent, monitors and equipment checked, pre-op evaluation, timeout performed and anesthesia consent Lidocaine 1% used for infiltration Right, radial was placed Catheter size: 20 G Hand hygiene performed  and maximum sterile barriers used   Attempts: 1 Procedure performed without using ultrasound guided technique. Following insertion, Biopatch and dressing applied. Post procedure assessment: normal  Patient tolerated the procedure well with no immediate complications.

## 2019-09-22 MED ORDER — TAMSULOSIN HCL 0.4 MG PO CAPS
0.8000 mg | ORAL_CAPSULE | Freq: Every day | ORAL | Status: DC
  Administered 2019-09-22 – 2019-10-03 (×11): 0.8 mg via ORAL
  Filled 2019-09-22 (×11): qty 2

## 2019-09-22 MED ORDER — ALBUTEROL SULFATE (2.5 MG/3ML) 0.083% IN NEBU
2.5000 mg | INHALATION_SOLUTION | Freq: Four times a day (QID) | RESPIRATORY_TRACT | Status: DC | PRN
  Administered 2019-09-22 – 2019-09-24 (×5): 2.5 mg via RESPIRATORY_TRACT
  Filled 2019-09-22 (×6): qty 3

## 2019-09-22 MED ORDER — LACTATED RINGERS IV BOLUS
1000.0000 mL | Freq: Once | INTRAVENOUS | Status: AC
  Administered 2019-09-22: 1000 mL via INTRAVENOUS

## 2019-09-22 NOTE — Progress Notes (Addendum)
Subjective: The patient is alert and pleasant.  He is ambulating the hallways.  He looks well.  He has had some urinary retention and has had to be catheterized.  Objective: Vital signs in last 24 hours: Temp:  [97.7 F (36.5 C)-98.7 F (37.1 C)] 97.7 F (36.5 C) (12/18 0716) Pulse Rate:  [58-89] 79 (12/18 0716) Resp:  [17-26] 18 (12/18 0716) BP: (131-169)/(66-132) 149/66 (12/18 0716) SpO2:  [90 %-98 %] 92 % (12/18 0716) Arterial Line BP: (154-175)/(65-74) 155/67 (12/17 1539) Weight:  [109.3 kg] 109.3 kg (12/17 0809) Estimated body mass index is 38.9 kg/m as calculated from the following:   Height as of this encounter: 5\' 6"  (1.676 m).   Weight as of this encounter: 109.3 kg.   Intake/Output from previous day: 12/17 0701 - 12/18 0700 In: 1300 [I.V.:1100; IV Piggyback:200] Out: 980 [Urine:880; Blood:100] Intake/Output this shift: No intake/output data recorded.  Physical exam the patient is alert and oriented.  His dressing is clean and dry.  There is no palpable hematoma or shift.  He is moving all 4 extremities well.  He has weakness in his bilateral deltoids at approximately 3/5.  Lab Results: Recent Labs    09/19/19 1030  WBC 9.8  HGB 15.2  HCT 45.2  PLT 312   BMET Recent Labs    09/19/19 1030  NA 138  K 3.9  CL 105  CO2 25  GLUCOSE 99  BUN 12  CREATININE 0.74  CALCIUM 9.8    Studies/Results: DG Cervical Spine 2-3 Views  Result Date: 09/21/2019 CLINICAL DATA:  C3-C6 ACDF. EXAM: CERVICAL SPINE - 2-3 VIEW COMPARISON:  MRI cervical spine dated September 02, 2019. FINDINGS: Two lateral intraoperative x-rays submitted for review. Film #1 demonstrates surgical localizer at C3-C4. Film #2 demonstrates completion C3-C6 ACDF. IMPRESSION: Intraoperative localization for C3-C6 ACDF. Electronically Signed   By: Titus Dubin M.D.   On: 09/21/2019 14:17    Assessment/Plan: Stop day #1: The patient is doing fairly well except for his C5 neuropraxia.  This will  likely resolve with time.  I have discussed range of motion exercises with him.  He wants to go home.  Hopefully we can send him home later on this afternoon if his urinary tension resolves.  I will start Flomax.  I have given him his discharge instructions and answered all his questions.  LOS: 1 day     Ophelia Charter 09/22/2019, 7:56 AM

## 2019-09-22 NOTE — Evaluation (Signed)
Physical Therapy Evaluation Patient Details Name: [name] [name] MRN: [id number] DOB: 1950/11/13 Today's Date: 09/22/2019   History of Present Illness  Pt is a 68 yo male s/p ACDF C3-4, C4-5, C5-6 and plating. Pt PMHx: CHF, MI, HTN, DJD, CAD,back sx, L TKA.  Clinical Impression  Pt admitted with above diagnosis. At the time of PT eval, pt was able to demonstrate transfers and ambulation with gross min guard assist to supervision for safety without an AD. Pt complaining of pain mostly in shoulders and had difficulty with shoulder flexion throughout session - even to bring his drink up to his mouth. Pt was educated on precautions, brace application/wearing schedule, appropriate activity progression, and car transfer. Pt currently with functional limitations due to the deficits listed below (see PT Problem List). Pt will benefit from skilled PT to increase their independence and safety with mobility to allow discharge to the venue listed below.      Follow Up Recommendations Outpatient PT;Supervision for mobility/OOB(MD to roder when appropriate per post-op protocol.)    Equipment Recommendations  None recommended by PT    Recommendations for Other Services       Precautions / Restrictions Precautions Precautions: Cervical Precaution Booklet Issued: Yes (comment) Precaution Comments: verbal discussion of handout and precautions Required Braces or Orthoses: Cervical Brace Restrictions Weight Bearing Restrictions: No      Mobility  Bed Mobility Overal bed mobility: Needs Assistance Bed Mobility: Rolling;Sit to Sidelying Rolling: Supervision Sidelying to sit: Supervision     Sit to sidelying: Supervision General bed mobility comments: VC's for proper log roll technique. Pt with increased effort and use of rails to transition to supine and get positioned properly.   Transfers Overall transfer level: Needs assistance Equipment used: None Transfers: Sit to/from Stand Sit to  Stand: Supervision         General transfer comment: VC's for hand placement on seated surface for safety. No assist to power-up to full stand however close supervision for safety was provided.   Ambulation/Gait Ambulation/Gait assistance: Min guard Gait Distance (Feet): 200 Feet Assistive device: None Gait Pattern/deviations: Step-through pattern;Decreased stride length;Trunk flexed Gait velocity: Decreased Gait velocity interpretation: <1.8 ft/sec, indicate of risk for recurrent falls General Gait Details: VC's for improved posture. Noted pt with wide BOS and holding UE's out in front of him into slight shoulder flexion.   Stairs            Wheelchair Mobility    Modified Rankin (Stroke Patients Only)       Balance Overall balance assessment: Mild deficits observed, not formally tested                                           Pertinent Vitals/Pain Pain Assessment: Faces Faces Pain Scale: Hurts even more Pain Location: neck; shoulders Pain Descriptors / Indicators: Discomfort;Operative site guarding;Aching;Heaviness Pain Intervention(s): Limited activity within patient's tolerance;Monitored during session;Repositioned    Home Living Family/patient expects to be discharged to:: Private residence Living Arrangements: Spouse/significant other Available Help at Discharge: Family Type of Home: House Home Access: Stairs to enter   Secretary/administrator of Steps: 1 Home Layout: One level Home Equipment: Environmental consultant - 2 wheels;Cane - single point      Prior Function Level of Independence: Needs assistance   Gait / Transfers Assistance Needed: Increased time  ADL's / Homemaking Assistance Needed: Pt requiring assist as LUE limited due  to previous injury and RUE limited by new weaknses and decreased coordination.         Hand Dominance   Dominant Hand: Right    Extremity/Trunk Assessment   Upper Extremity Assessment Upper Extremity  Assessment: Defer to OT evaluation RUE Deficits / Details: below shoulder movements, slow and decreased coordination; Pt AROM to 35* shoulder flex. RUE Coordination: decreased fine motor;decreased gross motor LUE Deficits / Details: AROM 45* shoulder flex. Pt with decreased coordination. LUE Coordination: decreased fine motor;decreased gross motor    Lower Extremity Assessment Lower Extremity Assessment: Generalized weakness    Cervical / Trunk Assessment Cervical / Trunk Assessment: Other exceptions Cervical / Trunk Exceptions: s/p cervical sx  Communication   Communication: No difficulties  Cognition Arousal/Alertness: Awake/alert Behavior During Therapy: WFL for tasks assessed/performed Overall Cognitive Status: Within Functional Limits for tasks assessed                                        General Comments      Exercises Other Exercises Other Exercises: shoulder ROM shoulder flex, abduct; elbow wrist and hand ROM and figner walks up wall   Assessment/Plan    PT Assessment Patient needs continued PT services  PT Problem List Decreased strength;Decreased activity tolerance;Decreased balance;Decreased range of motion;Decreased mobility;Decreased knowledge of use of DME;Decreased safety awareness;Decreased knowledge of precautions;Pain       PT Treatment Interventions DME instruction;Gait training;Functional mobility training;Therapeutic activities;Therapeutic exercise;Patient/family education;Neuromuscular re-education    PT Goals (Current goals can be found in the Care Plan section)  Acute Rehab PT Goals Patient Stated Goal: to go home PT Goal Formulation: With patient/family Time For Goal Achievement: 09/29/19 Potential to Achieve Goals: Good    Frequency Min 5X/week   Barriers to discharge        Co-evaluation               AM-PAC PT "6 Clicks" Mobility  Outcome Measure Help needed turning from your back to your side while in a flat  bed without using bedrails?: None Help needed moving from lying on your back to sitting on the side of a flat bed without using bedrails?: A Little Help needed moving to and from a bed to a chair (including a wheelchair)?: A Little Help needed standing up from a chair using your arms (e.g., wheelchair or bedside chair)?: A Little Help needed to walk in hospital room?: A Little Help needed climbing 3-5 steps with a railing? : A Little 6 Click Score: 19    End of Session Equipment Utilized During Treatment: Gait belt;Cervical collar Activity Tolerance: Patient tolerated treatment well Patient left: in bed;with call bell/phone within reach;with family/visitor present Nurse Communication: Mobility status PT Visit Diagnosis: Unsteadiness on feet (R26.81);Pain;Other symptoms and signs involving the nervous system (R29.898) Pain - part of body: (neck; shoulders)    Time: 7829-5621 PT Time Calculation (min) (ACUTE ONLY): 31 min   Charges:   PT Evaluation $PT Eval Moderate Complexity: 1 Mod PT Treatments $Gait Training: 8-22 mins        Rolinda Roan, PT, DPT Acute Rehabilitation Services Pager: [phone] Office: [phone]   Thelma Comp 09/22/2019, 12:19 PM

## 2019-09-22 NOTE — Progress Notes (Addendum)
Patient transferred to bed with wheezing & shortness of breath. Placed on 2L [place]. MD notified & PRN treatment will be placed.   2300: PRN neb treatment given & RT called to place patient on CPAP and to take a second look at patient. Patient had some relief from treatment. Patient has edema to incision site and generalized edema (+2)   0000: Patient resting in bed.   0053:VS stable, no shortness of breath, still some wheezing. States that he feels better. Will continue to monitor.   0746: Patient wanted to be taken off CPAP and placed back on oxygen. After placing on oxygen stated that he felf short of breath again PRN albuterol treatment given. Also states that he feels more comfortable breathing when CPAP is placed. Will place CPAP back on.   0867: RT called to come look at patient. States that he feels like his airway is closing. MD notified, waiting for call back.  Rapid and RT came to bedside, with patient being in distress. MD notified and MD Earnie Larsson called and stated he would be up shortly to see patients. MD at beside and patient on NRB sats at 93-96. PRN medication decadron, albuterol & racepinephrine given. Patient transferred to 4N. Belongings at beside and wife contacted. No answer. Left VM.

## 2019-09-22 NOTE — Progress Notes (Addendum)
Patient having difficulty voiding this shift. Bladder scanned at 1320 showed 0 ml. MD notified and ordered 1L Lactated Ringers Bolus which was given at 1418. Bladder scanned again at 1625 showed 298 ml. MD notified and advised to insert foley cath. Order carried out and initial drainage after insertion at 1820 is 900 ml. Will continue to monitor.

## 2019-09-22 NOTE — Progress Notes (Signed)
Pt was transferred to 5N08 via wheelchair with his belongings; with no acute complaints of pain nor discomfort; report given to receiving RN Caryl Pina.

## 2019-09-22 NOTE — Evaluation (Signed)
Occupational Therapy Evaluation Patient Details Name: [name] [name] MRN: [id number] DOB: [birth year] Today's Date: 09/22/2019    History of Present Illness Pt is a 68 yo male s/p ACDF C3-4, C4-5, C5-6 and plating. Pt PMHx: CHF, MI, HTN, DJD, CAD,back sx, L TKA.   Clinical Impression   Pt PTA: Recently, pt had been able to perform ADL and IADL by self and no AD for mobility. Pt currently is requiring assist with all ADL due to weakness and decreased ROM in BUE shoulders. Pt's gross and fine motor coordination are decreased in [place] thus making it difficult to manipulate objects in hand for grooming, feeding and dressing. Pt hopeful of recovery. Verbal education provided for hip kit, but pt reports that his spouse will assist needed. HEP provided for below the shoulder movements as directed by Dr. Arnoldo Morale. Pt requiring near [place] for donning shirt and brace as arms are so limited. Handout provided as spouse was not in room. Pt would benefit from continued OT in OP OT setting if mobility has not increased in [place]. Back handout provided and reviewed ADL in detail. Pt educated on: set an alarm at night for medication, avoid sitting for long periods of time, correct bed positioning for sleeping, correct sequence for bed mobility, avoiding lifting more than 5 pounds and never wash directly over incision. All education is complete and patient indicates understanding. OT signing off.    Follow Up Recommendations  Outpatient OT;Follow surgeon's recommendation for DC plan and follow-up therapies(Eventually OP OT if BUEs do not improve with HEP given.)    Equipment Recommendations  None recommended by OT    Recommendations for Other Services       Precautions / Restrictions Precautions Precautions: Cervical Precaution Booklet Issued: Yes (comment) Precaution Comments: verbal discussion of handout and precautions Required Braces or Orthoses: Cervical Brace Restrictions Weight Bearing Restrictions:  No      Mobility Bed Mobility Overal bed mobility: Needs Assistance Bed Mobility: Rolling;Sidelying to Sit Rolling: Supervision Sidelying to sit: Supervision       General bed mobility comments: education for log roll technique and limited rail use for steadying for rolling; pt pushing for trunk elevation  Transfers Overall transfer level: Needs assistance Equipment used: None Transfers: Sit to/from Stand Sit to Stand: Supervision              Balance Overall balance assessment: Mild deficits observed, not formally tested                                         ADL either performed or assessed with clinical judgement   ADL Overall ADL's : Needs assistance/impaired Eating/Feeding: Set up;Sitting   Grooming: Minimal assistance;Sitting Grooming Details (indicate cue type and reason): Assist for weakness in BUEs Upper Body Bathing: Moderate assistance;Sitting   Lower Body Bathing: Maximal assistance;Cueing for safety;Sitting/lateral leans;Sit to/from stand   Upper Body Dressing : Moderate assistance;Sitting Upper Body Dressing Details (indicate cue type and reason): Assist as arms are limited by poor coordination and weakness Lower Body Dressing: Maximal assistance;Sitting/lateral leans;Sit to/from stand   Toilet Transfer: Supervision/safety   Toileting- Water quality scientist and Hygiene: Moderate assistance;Sitting/lateral lean;Sit to/from stand       Functional mobility during ADLs: Supervision/safety General ADL Comments: Pt education on use of reacher, but pt's hands unable to facilitate movement of gripper well and shoulder region too weak to assist. Pt education on hip  kit verbally, but pt reporting that his spouse will assist as needed.     Vision Baseline Vision/History: No visual deficits Patient Visual Report: No change from baseline Vision Assessment?: No apparent visual deficits     Perception     Praxis      Pertinent  Vitals/Pain Pain Assessment: Faces Faces Pain Scale: Hurts little more Pain Location: neck Pain Descriptors / Indicators: Discomfort Pain Intervention(s): Limited activity within patient's tolerance     Hand Dominance Right   Extremity/Trunk Assessment Upper Extremity Assessment Upper Extremity Assessment: Generalized weakness;RUE deficits/detail;LUE deficits/detail RUE Deficits / Details: below shoulder movements, slow and decreased coordination; Pt AROM to 35* shoulder flex. RUE Coordination: decreased fine motor;decreased gross motor LUE Deficits / Details: AROM 45* shoulder flex. Pt with decreased coordination. LUE Coordination: decreased fine motor;decreased gross motor   Lower Extremity Assessment Lower Extremity Assessment: Defer to PT evaluation;Generalized weakness   Cervical / Trunk Assessment Cervical / Trunk Assessment: Other exceptions Cervical / Trunk Exceptions: s/p cervical sx   Communication Communication Communication: No difficulties   Cognition Arousal/Alertness: Awake/alert Behavior During Therapy: WFL for tasks assessed/performed Overall Cognitive Status: Within Functional Limits for tasks assessed                                     General Comments       Exercises Exercises: Other exercises Other Exercises Other Exercises: shoulder ROM shoulder flex, abduct; elbow wrist and hand ROM and figner walks up wall   Shoulder Instructions      Home Living Family/patient expects to be discharged to:: Private residence Living Arrangements: Spouse/significant other Available Help at Discharge: Family Type of Home: House Home Access: Stairs to enter Technical brewer of Steps: 1   Home Layout: One level     Bathroom Shower/Tub: Hospital doctor Toilet: Handicapped height     Home Equipment: Environmental consultant - 2 wheels;Cane - single point          Prior Functioning/Environment Level of Independence: Needs assistance  Gait  / Transfers Assistance Needed: Increased time ADL's / Homemaking Assistance Needed: Pt requiring assist as LUE limited due to previous injury and RUE limited by new weaknses and decreased coordination.             OT Problem List: Decreased strength;Decreased activity tolerance;Impaired balance (sitting and/or standing);Increased edema;Impaired UE functional use;Pain      OT Treatment/Interventions:      OT Goals(Current goals can be found in the care plan section) Acute Rehab OT Goals Patient Stated Goal: to go home OT Goal Formulation: With patient Time For Goal Achievement: 10/06/19 Potential to Achieve Goals: Good  OT Frequency:     Barriers to D/C:            Co-evaluation              AM-PAC OT "6 Clicks" Daily Activity     Outcome Measure Help from another person eating meals?: A Little Help from another person taking care of personal grooming?: A Lot Help from another person toileting, which includes using toliet, bedpan, or urinal?: A Lot Help from another person bathing (including washing, rinsing, drying)?: A Lot Help from another person to put on and taking off regular upper body clothing?: A Lot Help from another person to put on and taking off regular lower body clothing?: A Lot 6 Click Score: 13   End of Session Equipment  Utilized During Treatment: Cervical collar Nurse Communication: Mobility status  Activity Tolerance: Patient tolerated treatment well;Patient limited by pain Patient left: in chair;with call bell/phone within reach  OT Visit Diagnosis: Muscle weakness (generalized) (M62.81);Pain Pain - Right/Left: Left Pain - part of body: Shoulder                Time: 0715-0800 OT Time Calculation (min): 45 min Charges:  OT General Charges $OT Visit: 1 Visit OT Evaluation $OT Eval Moderate Complexity: 1 Mod OT Treatments $Self Care/Home Management : 23-37 mins  Ebony Hail Harold Hedge) Marsa Aris OTR/L Acute Rehabilitation Services Pager:  [phone] Office: [place] 09/22/2019, 9:17 AM

## 2019-09-23 ENCOUNTER — Inpatient Hospital Stay (HOSPITAL_COMMUNITY): Payer: Medicare Other

## 2019-09-23 DIAGNOSIS — G992 Myelopathy in diseases classified elsewhere: Secondary | ICD-10-CM

## 2019-09-23 DIAGNOSIS — M4802 Spinal stenosis, cervical region: Secondary | ICD-10-CM

## 2019-09-23 DIAGNOSIS — R0603 Acute respiratory distress: Secondary | ICD-10-CM

## 2019-09-23 LAB — GLUCOSE, CAPILLARY: Glucose-Capillary: 166 mg/dL — ABNORMAL HIGH (ref 70–99)

## 2019-09-23 MED ORDER — FUROSEMIDE 10 MG/ML IJ SOLN
20.0000 mg | Freq: Every day | INTRAMUSCULAR | Status: DC
  Administered 2019-09-23: 20 mg via INTRAVENOUS
  Filled 2019-09-23: qty 2

## 2019-09-23 MED ORDER — CHLORHEXIDINE GLUCONATE CLOTH 2 % EX PADS
6.0000 | MEDICATED_PAD | Freq: Every day | CUTANEOUS | Status: DC
  Administered 2019-09-23 – 2019-10-03 (×10): 6 via TOPICAL

## 2019-09-23 MED ORDER — METOPROLOL TARTRATE 5 MG/5ML IV SOLN
5.0000 mg | Freq: Three times a day (TID) | INTRAVENOUS | Status: DC
  Administered 2019-09-23 – 2019-09-27 (×9): 5 mg via INTRAVENOUS
  Filled 2019-09-23 (×15): qty 5

## 2019-09-23 MED ORDER — RACEPINEPHRINE HCL 2.25 % IN NEBU
0.5000 mL | INHALATION_SOLUTION | RESPIRATORY_TRACT | Status: AC
  Administered 2019-09-23: 0.5 mL via RESPIRATORY_TRACT
  Filled 2019-09-23: qty 0.5

## 2019-09-23 MED ORDER — RACEPINEPHRINE HCL 2.25 % IN NEBU
0.5000 mL | INHALATION_SOLUTION | RESPIRATORY_TRACT | Status: AC
  Administered 2019-09-23: 0.5 mL via RESPIRATORY_TRACT

## 2019-09-23 MED ORDER — DEXAMETHASONE SODIUM PHOSPHATE 10 MG/ML IJ SOLN
10.0000 mg | INTRAMUSCULAR | Status: AC
  Administered 2019-09-23: 10 mg via INTRAVENOUS

## 2019-09-23 MED ORDER — LORAZEPAM 2 MG/ML IJ SOLN
0.5000 mg | Freq: Two times a day (BID) | INTRAMUSCULAR | Status: DC | PRN
  Administered 2019-09-23 – 2019-09-26 (×4): 0.5 mg via INTRAVENOUS
  Filled 2019-09-23 (×6): qty 1

## 2019-09-23 MED ORDER — DEXAMETHASONE SODIUM PHOSPHATE 10 MG/ML IJ SOLN
INTRAMUSCULAR | Status: AC
  Filled 2019-09-23: qty 1

## 2019-09-23 MED ORDER — RACEPINEPHRINE HCL 2.25 % IN NEBU
INHALATION_SOLUTION | RESPIRATORY_TRACT | Status: AC
  Filled 2019-09-23: qty 0.5

## 2019-09-23 MED ORDER — DEXAMETHASONE SODIUM PHOSPHATE 10 MG/ML IJ SOLN
10.0000 mg | Freq: Four times a day (QID) | INTRAMUSCULAR | Status: DC
  Administered 2019-09-23 – 2019-09-26 (×12): 10 mg via INTRAVENOUS
  Filled 2019-09-23 (×12): qty 1

## 2019-09-23 NOTE — Consult Note (Signed)
Name: [name] [name] MRN: [id number] DOB: December 21, 1950    ADMISSION DATE:  09/21/2019 CONSULTATION DATE:  09/23/2019  REFERRING MD :  Antony Haste  CHIEF COMPLAINT: Respiratory distress  BRIEF PATIENT DESCRIPTION: 68 year old man with OSA, hypertension, atrial fibrillation underwent multilevel ACDF on 12/17 and developed acute respiratory distress 12/18 requiring transfer to ICU  SIGNIFICANT EVENTS  12/19 transferred to ICU for respiratory distress  STUDIES:  X-ray neck-prominent prevertebral soft tissue swelling   HISTORY OF PRESENT ILLNESS: 68 year old man with OSA, hypertension and atrial fibrillation who developed neck pain and quadriparesis with gait instability.  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  Procedure uneventful, postop he had some weakness in his right shoulder but was able to ambulate . he developed respiratory distress overnight 12/18, but improved with CPAP.  After being taken off CPAP in the morning again got worse, received albuterol racemic epi and Decadron and was transferred to the ICU.  Neck swelling present postop.  He denies any pain   PAST MEDICAL HISTORY :   has a past medical history of Anxiety, CHF (congestive heart failure) ([place]), Coronary artery disease, COVID-19, DJD (degenerative joint disease), Dyspnea, GERD (gastroesophageal reflux disease), History of kidney stones, HLD (hyperlipidemia), Hypertension, MI, old (2004), OA (osteoarthritis), Obesity, Persistent atrial fibrillation ([place]), Sleep apnea, Spinal stenosis, and Typical atrial flutter ([place]).  has a past surgical history that includes Coronary artery bypass graft; Back surgery (2011); transthoracic echocardiogram (08/25/2010); right foot fracture; hemorrhoidectomy; Lumbar laminectomy; Carpal tunnel release; fistula surgery; TEE without cardioversion ([name], 09/15/2016); Cardiac catheterization ([name], 09/15/2016); Total knee arthroplasty (Left,  07/07/2017); LEFT HEART CATH AND CORS/GRAFTS ANGIOGRAPHY ([name], 02/07/2018); ATRIAL FIBRILLATION ABLATION ([name], 05/24/2018); and Cholecystectomy. Prior to Admission medications   Medication Sig Start Date End Date Taking? Authorizing Provider  acetaminophen (TYLENOL) 500 MG tablet Take 1,000 mg by mouth every 6 (six) hours as needed for moderate pain.   Yes [provider]  apixaban (ELIQUIS) 5 MG TABS tablet Take 1 tablet (5 mg total) by mouth 2 (two) times daily. 03/17/19  Yes Arnoldo Lenis, MD  atorvastatin (LIPITOR) 20 MG tablet TAKE 1 TABLET BY MOUTH EVERY DAY 09/21/19  Yes Branch, Alphonse Guild, MD  Coenzyme Q10 (CO Q 10 PO) Take 400 mg by mouth daily.    Yes [provider]  diltiazem (CARDIZEM CD) 180 MG 24 hr capsule Take 1 capsule (180 mg total) by mouth daily. 03/17/19 07/31/20 Yes Branch, Alphonse Guild, MD  eplerenone (INSPRA) 25 MG tablet TAKE 1 TABLET BY MOUTH TWICE A DAY Patient taking differently: Take 25 mg by mouth 2 (two) times daily.  07/07/19  Yes Arnoldo Lenis, MD  furosemide (LASIX) 20 MG tablet TAKE 1 TABLET BY MOUTH DAILY AS NEEDED FOR SWELLING/WEIGHT GAIN 09/21/19  Yes Branch, Alphonse Guild, MD  hydrALAZINE (APRESOLINE) 50 MG tablet Take 1.5 tablets (75 mg total) by mouth 3 (three) times daily. 09/12/19  Yes Branch, Alphonse Guild, MD  irbesartan (AVAPRO) 300 MG tablet Take 1 tablet (300 mg total) by mouth daily. Patient taking differently: Take 300 mg by mouth at bedtime.  09/13/19  Yes Branch, Alphonse Guild, MD  LORazepam (ATIVAN) 0.5 MG tablet Take 1 tablet (0.5 mg total) by mouth 3 (three) times daily. 07/20/19  Yes Lily Kocher, PA-C  metoprolol tartrate (LOPRESSOR) 25 MG tablet TAKE 1 TABLET (25 MG TOTAL) BY MOUTH EVERY 8 (EIGHT) HOURS AS NEEDED (A-FIB). Patient taking differently: Take 25 mg by mouth daily.  07/07/19  Yes BranchDorothe Pea, MD  Multiple Vitamin (MULTIVITAMIN) tablet Take 1 tablet by mouth daily.   Yes [provider]  omeprazole  (PRILOSEC) 20 MG capsule TAKE 1 CAPSULE BY MOUTH EVERY OTHER DAY. Patient taking differently: Take 20 mg by mouth every other day.  11/01/18  Yes BranchDorothe Pea, MD  PRESCRIPTION MEDICATION Pt uses CPAP machine at bedtime   Yes [provider]  sertraline (ZOLOFT) 50 MG tablet Take 50 mg by mouth daily.   Yes [provider]  nitroGLYCERIN (NITROSTAT) 0.4 MG SL tablet Place 1 tablet (0.4 mg total) under the tongue every 5 (five) minutes x 3 doses as needed for chest pain. Patient not taking: Reported on 09/13/2019 02/07/18   Arty Baumgartner, NP   Allergies  Allergen Reactions  . Vancomycin Anaphylaxis, Shortness Of Breath and Other (See Comments)    Was informed after heart surgery Breathing problem patient unsure  . Aldactone [Spironolactone]     GYNECOMASTIA   . Chlorthalidone Other (See Comments)    GOUT  . Nsaids Other (See Comments)    On blood thinner    FAMILY HISTORY:  family history includes Breast cancer in his mother; Coronary artery disease in his father; Hypertension in his father and mother; Stroke in his father. SOCIAL HISTORY:  reports that he has never smoked. He has never used smokeless tobacco. He reports current alcohol use. He reports that he does not use drugs.  REVIEW OF SYSTEMS:   Unable to obtain in detail due to BiPAP No neck pain Complains of difficulty breathing and wheezing, no cough,  SUBJECTIVE:   VITAL SIGNS: Temp:  [97.8 F (36.6 C)-98.3 F (36.8 C)] 97.8 F (36.6 C) (12/19 0806) Pulse Rate:  [68-99] 99 (12/19 0914) Resp:  [16-26] 26 (12/19 0914) BP: (117-171)/(68-91) 138/89 (12/19 0914) SpO2:  [93 %-99 %] 97 % (12/19 0914)  PHYSICAL EXAMINATION: Gen. Pleasant, obese, in no distress, normal affect ENT - no pallor,icterus, no post nasal drip, class 2-3 airway Neck: No JVD, no thyromegaly, no carotid bruits, swelling with external bruising postop Lungs: mild use of accessory muscles, no dullness to percussion,  decreased without rales , upper airway pseudowheezing expiratory, no stridor Cardiovascular: Rhythm regular, heart sounds  normal, no murmurs or gallops, no peripheral edema Abdomen: soft and non-tender, no hepatosplenomegaly, BS normal. Musculoskeletal: No deformities, no cyanosis or clubbing Neuro:  alert, non focal, no tremors   Recent Labs  Lab 09/19/19 1030  NA 138  K 3.9  CL 105  CO2 25  BUN 12  CREATININE 0.74  GLUCOSE 99   Recent Labs  Lab 09/19/19 1030  HGB 15.2  HCT 45.2  WBC 9.8  PLT 312   DG Neck Soft Tissue  Result Date: 09/23/2019 CLINICAL DATA:  Neck swelling and difficulty breathing. Recent ACDF 2 days ago. EXAM: NECK SOFT TISSUES - 1+ VIEW COMPARISON:  Cervical spine x-rays dated September 21, 2019 FINDINGS: C3-C6 ACDF. Prominent prevertebral soft tissue swelling, increased when compared to intraoperative x-rays from 2 days ago. There is some mass effect on the hypopharynx. No epiglottic enlargement. IMPRESSION: Prominent prevertebral soft tissue swelling, increased when compared to intraoperative x-rays from 2 days ago. Electronically Signed   By: Obie Dredge M.D.   On: 09/23/2019 09:11   DG Cervical Spine 2-3 Views  Result Date: 09/21/2019 CLINICAL DATA:  C3-C6 ACDF. EXAM: CERVICAL SPINE - 2-3 VIEW COMPARISON:  MRI cervical spine dated September 02, 2019. FINDINGS: Two lateral intraoperative x-rays submitted  for review. Film #1 demonstrates surgical localizer at C3-C4. Film #2 demonstrates completion C3-C6 ACDF. IMPRESSION: Intraoperative localization for C3-C6 ACDF. Electronically Signed   By: Obie Dredge M.D.   On: 09/21/2019 14:17   DG CHEST PORT 1 VIEW  Result Date: 09/23/2019 CLINICAL DATA:  Shortness of breath.  Recent ACDF. EXAM: PORTABLE CHEST 1 VIEW COMPARISON:  Chest x-ray dated July 20, 2019. FINDINGS: Stable mild cardiomegaly status post CABG. Mild pulmonary vascular congestion. Low lung volumes with right greater than left basilar  atelectasis. Possible small right pleural effusion. No pneumothorax. No acute osseous abnormality. Prior cervical ACDF. IMPRESSION: 1. Mild pulmonary vascular congestion. Possible small right pleural effusion. 2. Low lung volumes with right greater than left basilar opacities, favor atelectasis. Electronically Signed   By: Obie Dredge M.D.   On: 09/23/2019 09:00    ASSESSMENT / PLAN:  Acute respiratory distress -appears to be related to postop swelling p- ACDF.  Concern here is for upper airway compromise  Discussed with Dr. Dutch Quint from neurosurgery, he is not impressed with neck hematoma and does not feel that he requires surgery or neck CT imaging currently. He has settled with BiPAP and is able to speak in full sentences -We will monitor him closely and have low threshold to intubate should he deteriorate -We will give him a break from the BiPAP in 6 hours -Decadron 10 every 6 per neurosurgery, monitor CBGs  Hypertension/atrial fibrillation-we will keep him n.p.o. Meantime, change Lopressor to IV, hold Cardizem Lasix can be changed to 20 mg IV daily   Cyril Mourning MD. FCCP. [facility]  If no response to pager , please call [phone]     09/23/2019, 9:28 AM

## 2019-09-23 NOTE — Progress Notes (Signed)
Patient transferred to 4N29, SOB, RR 39 and on NRB. Patient placed on BiPAP per Dr. Trenton Gammon verbal order.  Additional breathing treatment given see MAR.  Patient is less SOB, RR 22 and oxygen saturations 97%.  Will continue to monitor.   Debra-wife called and given an update on events.

## 2019-09-23 NOTE — Progress Notes (Signed)
Patient looks better.  Breathing more easily on CPAP.  Oxygenating well.  Motor and sensory function stable.  Neck continues to be obese and somewhat swollen but certainly is not tense.  His airway is midline.  There is nothing to suggest a compressive mass upon the airway.  Patient status post multilevel anterior cervical decompression and fusion.  Situation complicated by obesity and sleep apnea.  Certainly there is some postoperative swelling and there may be some degree of postoperative hemorrhage but I do not think it is sufficient to warrant any type of operative intervention at present.  Plan treat with IV Decadron and continue observation.  The use of CPAP for now.

## 2019-09-23 NOTE — Progress Notes (Signed)
RT NOTES: Removed patient from bipap per Dr Elsworth Soho. Placed on 10L HFNC. Sats 94%. Will continue to monitor.

## 2019-09-23 NOTE — Progress Notes (Signed)
PT Cancellation Note  Patient Details Name: [name] [name] MRN: [id number] DOB: [birth year]   Cancelled Treatment:    Reason Eval/Treat Not Completed: Medical issues which prohibited therapy. Pt transferred from 5N to 4N ICU this AM secondary to acute respiratory distress on NRB, then placed on BiPAP. PT will continue to follow up with pt acutely as available and appropriate.    [place] 09/23/2019, 10:40 AM

## 2019-09-23 NOTE — Progress Notes (Signed)
RT NOTES: Placed patient back on bipap to rest, work of breathing starting to increase. Patient states he breathes better on the bipap. Pt breathing normally at this time with no distress noted, sats 95%. Will continue to monitor.

## 2019-09-23 NOTE — Progress Notes (Signed)
Called to see patient this morning with shortness of breath.  Patient 2 days status post multilevel anterior cervical decompression and fusion with Dr.Jenkins.  He has had some difficulty with wheezing and shortness of breath chronically.  Last night he had similar symptoms that responded to albuterol.  He used CPAP overnight and this morning states that he feels like he can get air.  O2 sats on 2 L oxygen were 85.  O2 sats on a nonrebreather were in the mid 90s.  Patient mildly tachypneic.  Obviously struggling somewhat to breathe.  Has some wheezing but nothing that I would refer to his stridor.  His neck is obese.  It somewhat swollen and bruised however it is not firm.  The airway is midline.  I do not appreciate anything truly compressive upon the airway.  X-rays taken of his lungs demonstrate normal aeration bilaterally without obvious abnormality.  X-rays of the cervical spine do demonstrate soft tissue swelling within his neck with some prevertebral swelling but I would characterize this is reasonably normal postop.    At this point I do not believe he is actively suffering true airway compromise.  I would like to transfer him to the intensive care unit.  Continue efforts with nebulizers and begin Decadron 4 swelling.  We will monitor through the course this morning.  If the patient persists with increased work of breathing and ultimate respiratory failure we may have to consider intubation.

## 2019-09-23 NOTE — Significant Event (Addendum)
Rapid Response Event Note  Overview: Respiratory Distress  Initial Focused Assessment: Received a call from primary nurse with concerns of patient having worsening shortness of breath and wheezing. Per nurse, this happened in the overnight hours. RT present at bedside as well.  Upon arrival, patient was in distress. . Patient was placed on NRB 15L - saturations were 95%, patient appeared quite distressed, RR in the uppers 30s, significant accessory muscle use and increased WOB. Patient was quite SOB, could only say a few words, was tearful and quite anxious. Upper airway - quite noisy, + stridor, lung sounds -- significant wheezing throughout and diminished in the bases. HR 90s, stable BP. Nurse had already paged MD, MD on the way. Neck swelling present, per nurse, its been this swollen, I did not feel any sub-q air in the upper regions of his neck or chest, but it is quite swollen per my assessment.  Interventions: -- CXR and ST Neck XR -- Racemic Epi x 2 (one prior to transfer, one after in ICU). -- Albuterol Neb x 2 -- Decadron 10 mg IV x 1 -- BIPAP initiated in ICU  Plan of Care: -- Urgent transfer to ICU.   Event Summary:  Call Time 0802 Arrival Time 0804 End Time 0930 (I stayed to help in the ICU)  Ziana Heyliger R

## 2019-09-24 DIAGNOSIS — J9601 Acute respiratory failure with hypoxia: Secondary | ICD-10-CM

## 2019-09-24 MED ORDER — FUROSEMIDE 10 MG/ML IJ SOLN
40.0000 mg | Freq: Every day | INTRAMUSCULAR | Status: DC
  Administered 2019-09-24 – 2019-09-26 (×3): 40 mg via INTRAVENOUS
  Filled 2019-09-24 (×5): qty 4

## 2019-09-24 NOTE — Progress Notes (Signed)
RT NOTES: Removed patient from bipap and placed on 15L HFNC. Will titrate oxygen as able.

## 2019-09-24 NOTE — Progress Notes (Addendum)
Physical Therapy Treatment Patient Details Name: [name] [name] MRN: [id number] DOB: May 31, 1951 Today's Date: 09/24/2019    History of Present Illness Pt is a 68 yo male s/p ACDF C3-4, C4-5, C5-6 and plating. Pt PMHx: CHF, MI, HTN, DJD, CAD,back sx, L TKA.    PT Comments    Pt seen for limited treatment session on BiPAP to promote cardiopulmonary endurance and strengthening. Pt performing bed mobility with min guard assist, able to sit edge of bed ~15 minutes. SpO2 briefly dipping at one point to 88% but pt quickly rebounding > 90%, BP 164/80. Able to participate in edge of bed exercises and stand and side step up to head of bed. Will continue to progress as tolerated.     Follow Up Recommendations  Outpatient PT;Supervision for mobility/OOB     Equipment Recommendations  None recommended by PT    Recommendations for Other Services       Precautions / Restrictions Precautions Precautions: Cervical Precaution Booklet Issued: Yes (comment) Required Braces or Orthoses: Cervical Brace Cervical Brace: Hard collar Restrictions Weight Bearing Restrictions: No    Mobility  Bed Mobility Overal bed mobility: Needs Assistance Bed Mobility: Rolling;Sit to Sidelying;Sidelying to Sit Rolling: Supervision Sidelying to sit: Min guard     Sit to sidelying: Min guard General bed mobility comments: Min guard for safety/line management  Transfers Overall transfer level: Needs assistance Equipment used: None Transfers: Sit to/from Stand Sit to Stand: Supervision         General transfer comment: Supervision to stand from edge of bed and side step towards right  Ambulation/Gait                 Stairs             Wheelchair Mobility    Modified Rankin (Stroke Patients Only)       Balance Overall balance assessment: Mild deficits observed, not formally tested                                          Cognition Arousal/Alertness:  Awake/alert Behavior During Therapy: WFL for tasks assessed/performed Overall Cognitive Status: Within Functional Limits for tasks assessed                                        Exercises General Exercises - Upper Extremity Elbow Flexion: Both;10 reps;Seated Elbow Extension: Both;10 reps;Seated General Exercises - Lower Extremity Long Arc Quad: Both;10 reps;Seated Hip Flexion/Marching: Both;10 reps;Seated Heel Raises: Both;10 reps;Seated Other Exercises Other Exercises: Backward shoulder rolls, shoulder shrugs x 10      General Comments        Pertinent Vitals/Pain Pain Assessment: Faces Faces Pain Scale: Hurts even more Pain Location: bilateral anterior shoulders Pain Descriptors / Indicators: Discomfort;Operative site guarding;Aching;Heaviness Pain Intervention(s): Limited activity within patient's tolerance;Monitored during session;Premedicated before session;Repositioned    Home Living                      Prior Function            PT Goals (current goals can now be found in the care plan section) Acute Rehab PT Goals Patient Stated Goal: to go home Potential to Achieve Goals: Good Progress towards PT goals: Progressing toward goals    Frequency  Min 5X/week      PT Plan Current plan remains appropriate    Co-evaluation              AM-PAC PT "6 Clicks" Mobility   Outcome Measure  Help needed turning from your back to your side while in a flat bed without using bedrails?: None Help needed moving from lying on your back to sitting on the side of a flat bed without using bedrails?: A Little Help needed moving to and from a bed to a chair (including a wheelchair)?: A Little Help needed standing up from a chair using your arms (e.g., wheelchair or bedside chair)?: A Little Help needed to walk in hospital room?: A Little Help needed climbing 3-5 steps with a railing? : A Lot 6 Click Score: 18    End of Session    Activity Tolerance: Patient tolerated treatment well Patient left: in bed;with call bell/phone within reach;with nursing/sitter in room Nurse Communication: Mobility status PT Visit Diagnosis: Unsteadiness on feet (R26.81);Pain;Other symptoms and signs involving the nervous system (R29.898)     Time: 0300-9233 PT Time Calculation (min) (ACUTE ONLY): 24 min  Charges:  $Therapeutic Exercise: 8-22 mins $Therapeutic Activity: 8-22 mins                     Ellamae Sia, PT, DPT Acute Rehabilitation Services Pager [phone] Office [phone]    Willy Eddy 09/24/2019, 3:54 PM

## 2019-09-24 NOTE — Plan of Care (Signed)
  Problem: Clinical Measurements: Goal: Ability to maintain clinical measurements within normal limits will improve Outcome: Progressing   

## 2019-09-24 NOTE — Progress Notes (Signed)
RT NOTES: Removed patient from bipap and placed on 15L HFNC. Sats 93%. Will continue to monitor.

## 2019-09-24 NOTE — Plan of Care (Signed)
  Problem: Education: Goal: Knowledge of the prescribed therapeutic regimen will improve Outcome: Progressing   

## 2019-09-24 NOTE — Progress Notes (Addendum)
  NEUROSURGERY PROGRESS NOTE   No issues overnight. Pt reports some SOB but significantly improved compared to yesterday and Friday.  EXAM:  BP (!) 151/74   Pulse 79   Temp 98.3 F (36.8 C) (Oral)   Resp 15   Ht 5\' 6"  (1.676 m)   Wt 109.3 kg   SpO2 93%   BMI 38.90 kg/m   Awake, alert, oriented  Speech fluent, appropriate  CN grossly intact  Stable deltoid and bilateral bicep weakness Neck wound relatively soft, airway midline  IMPRESSION:  68 y.o. male POD# 3 s/p ACDF with SOB which has responded well to steroids/nebs, although still requires relatively high FiO2 by mask. Is unlikely to require operative exploration  PLAN: - Cont current supportive care including nebs PRN and CPAP - Will need PT/OT - If O2 requirement remains high, may consider CTA chest tomorrow to R/O PE.

## 2019-09-24 NOTE — Progress Notes (Signed)
Name: [name] [name] MRN: [id number] DOB: Apr 08, 1951    ADMISSION DATE:  09/21/2019 CONSULTATION DATE:  09/24/2019  REFERRING MD :  Trenton Gammon, [place]  CHIEF COMPLAINT: Respiratory distress  BRIEF PATIENT DESCRIPTION: 68 year old man with OSA, hypertension, atrial fibrillation underwent multilevel ACDF on 12/17 and developed acute respiratory distress 12/18 requiring transfer to ICU  SIGNIFICANT EVENTS  12/19 transferred to ICU for respiratory distress  STUDIES:  X-ray neck-prominent prevertebral soft tissue swelling  SUBJECTIVE:   VITAL SIGNS: Temp:  [97.7 F (36.5 C)-98.3 F (36.8 C)] 98.3 F (36.8 C) (12/20 0400) Pulse Rate:  [64-99] 64 (12/20 0500) Resp:  [10-26] 12 (12/20 0500) BP: (88-162)/(50-89) 139/76 (12/20 0500) SpO2:  [94 %-97 %] 97 % (12/20 0809) FiO2 (%):  [80 %-100 %] 80 % (12/19 1600)  PHYSICAL EXAMINATION: Gen. Pleasant, obese, in no distress, normal affect ENT - no pallor,icterus, no post nasal drip, class 2 airway Neck: No JVD, no thyromegaly, no carotid bruits, hematoma with external bruising Lungs: no use of accessory muscles, no dullness to percussion, decreased without rales , mild upper airway pseudowheezing expiratory, no stridor Cardiovascular: Rhythm regular, heart sounds  normal, no murmurs or gallops, no peripheral edema Abdomen: soft and non-tender, no hepatosplenomegaly, BS normal. Musculoskeletal: No deformities, no cyanosis or clubbing Neuro:  alert, non focal, no tremors   Recent Labs  Lab 09/19/19 1030  NA 138  K 3.9  CL 105  CO2 25  BUN 12  CREATININE 0.74  GLUCOSE 99   Recent Labs  Lab 09/19/19 1030  HGB 15.2  HCT 45.2  WBC 9.8  PLT 312   DG Neck Soft Tissue  Result Date: 09/23/2019 CLINICAL DATA:  Neck swelling and difficulty breathing. Recent ACDF 2 days ago. EXAM: NECK SOFT TISSUES - 1+ VIEW COMPARISON:  Cervical spine x-rays dated September 21, 2019 FINDINGS: C3-C6 ACDF. Prominent prevertebral soft tissue swelling,  increased when compared to intraoperative x-rays from 2 days ago. There is some mass effect on the hypopharynx. No epiglottic enlargement. IMPRESSION: Prominent prevertebral soft tissue swelling, increased when compared to intraoperative x-rays from 2 days ago. Electronically Signed   By: Titus Dubin M.D.   On: 09/23/2019 09:11   DG CHEST PORT 1 VIEW  Result Date: 09/23/2019 CLINICAL DATA:  Shortness of breath.  Recent ACDF. EXAM: PORTABLE CHEST 1 VIEW COMPARISON:  Chest x-ray dated July 20, 2019. FINDINGS: Stable mild cardiomegaly status post CABG. Mild pulmonary vascular congestion. Low lung volumes with right greater than left basilar atelectasis. Possible small right pleural effusion. No pneumothorax. No acute osseous abnormality. Prior cervical ACDF. IMPRESSION: 1. Mild pulmonary vascular congestion. Possible small right pleural effusion. 2. Low lung volumes with right greater than left basilar opacities, favor atelectasis. Electronically Signed   By: Titus Dubin M.D.   On: 09/23/2019 09:00    ASSESSMENT / PLAN: Acute hypoxic respiratory failure- related to postop swelling p- ACDF.  Improved from yesterday, so less concern for upper airway compromise He appears comfortable off BiPAP and able to speak in full sentences, down to 10 L nasal cannula -Decadron 10 every 6 per neurosurgery, monitor CBGs -Cause for hypoxia likely related to upper airway, will diurese -If remains hypoxic, then will consider looking for VTE  Hypertension/atrial fibrillation-resume p.o. meds including Lopressor and Cardizem Lasix can be changed to 40 mg IV daily   Kara Mead MD. FCCP. [facility]  If no response to pager , please call [phone]     09/24/2019, 8:31 AM

## 2019-09-25 ENCOUNTER — Inpatient Hospital Stay (HOSPITAL_COMMUNITY): Payer: Medicare Other

## 2019-09-25 ENCOUNTER — Encounter: Payer: Self-pay | Admitting: *Deleted

## 2019-09-25 DIAGNOSIS — J9601 Acute respiratory failure with hypoxia: Secondary | ICD-10-CM

## 2019-09-25 LAB — BASIC METABOLIC PANEL
Anion gap: 11 (ref 5–15)
BUN: 35 mg/dL — ABNORMAL HIGH (ref 8–23)
CO2: 26 mmol/L (ref 22–32)
Calcium: 9.4 mg/dL (ref 8.9–10.3)
Chloride: 102 mmol/L (ref 98–111)
Creatinine, Ser: 0.95 mg/dL (ref 0.61–1.24)
GFR calc Af Amer: >60 mL/min (ref >60)
GFR calc non Af Amer: >60 mL/min (ref >60)
Glucose, Bld: 160 mg/dL — ABNORMAL HIGH (ref 70–99)
Potassium: 4.5 mmol/L (ref 3.5–5.1)
Sodium: 139 mmol/L (ref 135–145)

## 2019-09-25 LAB — POCT I-STAT 7, (LYTES, BLD GAS, ICA,H+H)
Acid-Base Excess: 6 mmol/L — ABNORMAL HIGH (ref 0.0–2.0)
Bicarbonate: 30.1 mmol/L — ABNORMAL HIGH (ref 20.0–28.0)
Calcium, Ion: 1.28 mmol/L (ref 1.15–1.40)
HCT: 42 % (ref 39.0–52.0)
Hemoglobin: 14.3 g/dL (ref 13.0–17.0)
O2 Saturation: 90 %
Patient temperature: 97.4
Potassium: 3.7 mmol/L (ref 3.5–5.1)
Sodium: 138 mmol/L (ref 135–145)
TCO2: 31 mmol/L (ref 22–32)
pCO2 arterial: 41.1 mmHg (ref 32.0–48.0)
pH, Arterial: 7.47 — ABNORMAL HIGH (ref 7.350–7.450)
pO2, Arterial: 54 mmHg — ABNORMAL LOW (ref 83.0–108.0)

## 2019-09-25 LAB — CBC
HCT: 39.5 % (ref 39.0–52.0)
Hemoglobin: 13.2 g/dL (ref 13.0–17.0)
MCH: 29.1 pg (ref 26.0–34.0)
MCHC: 33.4 g/dL (ref 30.0–36.0)
MCV: 87.2 fL (ref 80.0–100.0)
Platelets: 297 10*3/uL (ref 150–400)
RBC: 4.53 MIL/uL (ref 4.22–5.81)
RDW: 13.3 % (ref 11.5–15.5)
WBC: 14.6 10*3/uL — ABNORMAL HIGH (ref 4.0–10.5)
nRBC: 0 % (ref 0.0–0.2)

## 2019-09-25 LAB — PHOSPHORUS: Phosphorus: 2.7 mg/dL (ref 2.5–4.6)

## 2019-09-25 LAB — MAGNESIUM: Magnesium: 2.3 mg/dL (ref 1.7–2.4)

## 2019-09-25 MED ORDER — FUROSEMIDE 10 MG/ML IJ SOLN
20.0000 mg | Freq: Once | INTRAMUSCULAR | Status: AC
  Administered 2019-09-25: 20 mg via INTRAVENOUS

## 2019-09-25 NOTE — Progress Notes (Signed)
Subjective: The patient is alert and pleasant.  He is wearing his CPAP mask.  He is in no apparent distress.  He has some dysphagia but this is improving.  Not having any respiratory distress.  Objective: Vital signs in last 24 hours: Temp:  [97 F (36.1 C)-97.9 F (36.6 C)] 97.4 F (36.3 C) (12/21 1200) Pulse Rate:  [65-87] 69 (12/21 1119) Resp:  [10-28] 14 (12/21 1119) BP: (116-154)/(62-84) 136/74 (12/21 1119) SpO2:  [87 %-95 %] 91 % (12/21 1119) FiO2 (%):  [50 %] 50 % (12/20 1853) Estimated body mass index is 38.9 kg/m as calculated from the following:   Height as of this encounter: 5\' 6"  (1.676 m).   Weight as of this encounter: 109.3 kg.   Intake/Output from previous day: 12/20 0701 - 12/21 0700 In: 200 [P.O.:200] Out: 2100 [Urine:2100] Intake/Output this shift: Total I/O In: 50 [P.O.:50] Out: 1300 [Urine:1300]  Physical exam the patient is alert and pleasant.  He is moving all 4 extremities.  He continues to have weakness in his right greater left deltoid.  The patient's wound is healing well.  His neck is quite obese at baseline.  There is no midline shift.  There is some palpable swelling.  I have reviewed the patient's cervical x-ray performed 09/23/2019.  It demonstrates a moderate upper cervical prevertebral hematoma.  Lab Results: Recent Labs    09/25/19 0307  WBC 14.6*  HGB 13.2  HCT 39.5  PLT 297   BMET Recent Labs    09/25/19 0307  NA 139  K 4.5  CL 102  CO2 26  GLUCOSE 160*  BUN 35*  CREATININE 0.95  CALCIUM 9.4    Studies/Results: DG Chest Port 1 View  Result Date: 09/25/2019 CLINICAL DATA:  Acute respiratory failure. EXAM: PORTABLE CHEST 1 VIEW COMPARISON:  Radiograph 09/23/2019 FINDINGS: Low lung volumes limit assessment. Post median sternotomy. Cardiomegaly. Elevated right hemidiaphragm versus volume loss at the right lung base. Streaky left lung base atelectasis. Improved vascular congestion. No pneumothorax. IMPRESSION: 1. Low lung  volumes. 2. Elevated right hemidiaphragm versus volume loss at the right lung base, this may be due to atelectasis/collapse or pleural fluid. 3. Cardiomegaly.  Improved vascular congestion. Electronically Signed   By: 09/25/2019 M.D.   On: 09/25/2019 05:57   VAS 09/27/2019 LOWER EXTREMITY VENOUS (DVT)  Result Date: 09/25/2019  Lower Venous Study Indications: Acute respiratory failure with hypoxia.  Performing Technologist: 09/27/2019 RDMS, RVT  Examination Guidelines: A complete evaluation includes B-mode imaging, spectral Doppler, color Doppler, and power Doppler as needed of all accessible portions of each vessel. Bilateral testing is considered an integral part of a complete examination. Limited examinations for reoccurring indications may be performed as noted.  +---------+---------------+---------+-----------+----------+--------------+ RIGHT    CompressibilityPhasicitySpontaneityPropertiesThrombus Aging +---------+---------------+---------+-----------+----------+--------------+ CFV      Full           Yes      Yes                                 +---------+---------------+---------+-----------+----------+--------------+ SFJ      Full                                                        +---------+---------------+---------+-----------+----------+--------------+ FV Prox  Full                                                        +---------+---------------+---------+-----------+----------+--------------+  FV Mid   Full                                                        +---------+---------------+---------+-----------+----------+--------------+ FV DistalFull                                                        +---------+---------------+---------+-----------+----------+--------------+ PFV      Full                                                        +---------+---------------+---------+-----------+----------+--------------+ POP      Full            Yes      Yes                                 +---------+---------------+---------+-----------+----------+--------------+ PTV      Full                                                        +---------+---------------+---------+-----------+----------+--------------+ PERO     Full                                                        +---------+---------------+---------+-----------+----------+--------------+   +---------+---------------+---------+-----------+----------+--------------+ LEFT     CompressibilityPhasicitySpontaneityPropertiesThrombus Aging +---------+---------------+---------+-----------+----------+--------------+ CFV      Full           Yes      Yes                                 +---------+---------------+---------+-----------+----------+--------------+ SFJ      Full                                                        +---------+---------------+---------+-----------+----------+--------------+ FV Prox  Full                                                        +---------+---------------+---------+-----------+----------+--------------+ FV Mid   Full                                                        +---------+---------------+---------+-----------+----------+--------------+  FV DistalFull                                                        +---------+---------------+---------+-----------+----------+--------------+ PFV      Full                                                        +---------+---------------+---------+-----------+----------+--------------+ POP      Full           Yes      Yes                                 +---------+---------------+---------+-----------+----------+--------------+ PTV      Full                                                        +---------+---------------+---------+-----------+----------+--------------+ PERO     Full                                                         +---------+---------------+---------+-----------+----------+--------------+     Summary: Right: There is no evidence of deep vein thrombosis in the lower extremity. No cystic structure found in the popliteal fossa. Left: There is no evidence of deep vein thrombosis in the lower extremity. No cystic structure found in the popliteal fossa.  *See table(s) above for measurements and observations.    Preliminary     Assessment/Plan: Postop day #4: I think at this point the patient is "over the hump".  We are going to mobilize him with PT.  He will likely go home in the next day or 2.  LOS: 4 days     Ophelia Charter 09/25/2019, 3:10 PM

## 2019-09-25 NOTE — Progress Notes (Signed)
Providing Compassionate, Quality Care - Together   Subjective: Patient reports no significant issues overnight. Pain is well-controlled. Still requiring BiPAP intermittently.  Objective: Vital signs in last 24 hours: Temp:  [97 F (36.1 C)-97.9 F (36.6 C)] 97.4 F (36.3 C) (12/21 1200) Pulse Rate:  [65-87] 69 (12/21 1119) Resp:  [10-28] 14 (12/21 1119) BP: (116-154)/(62-84) 136/74 (12/21 1119) SpO2:  [87 %-95 %] 91 % (12/21 1119) FiO2 (%):  [50 %] 50 % (12/20 1853)  Intake/Output from previous day: 12/20 0701 - 12/21 0700 In: 200 [P.O.:200] Out: 2100 [Urine:2100] Intake/Output this shift: Total I/O In: 50 [P.O.:50] Out: 1300 [Urine:1300]  Alert and oriented x 4 PERRLA CN II-XII grossly intact MAE, Strength and sensation intact Incision is covered with Steri Strips; Dressing is clean, dry, and intact Moderate post-operative swelling at surgical site   Lab Results: Recent Labs    09/25/19 0307  WBC 14.6*  HGB 13.2  HCT 39.5  PLT 297   BMET Recent Labs    09/25/19 0307  NA 139  K 4.5  CL 102  CO2 26  GLUCOSE 160*  BUN 35*  CREATININE 0.95  CALCIUM 9.4    Studies/Results: DG Chest Port 1 View  Result Date: 09/25/2019 CLINICAL DATA:  Acute respiratory failure. EXAM: PORTABLE CHEST 1 VIEW COMPARISON:  Radiograph 09/23/2019 FINDINGS: Low lung volumes limit assessment. Post median sternotomy. Cardiomegaly. Elevated right hemidiaphragm versus volume loss at the right lung base. Streaky left lung base atelectasis. Improved vascular congestion. No pneumothorax. IMPRESSION: 1. Low lung volumes. 2. Elevated right hemidiaphragm versus volume loss at the right lung base, this may be due to atelectasis/collapse or pleural fluid. 3. Cardiomegaly.  Improved vascular congestion. Electronically Signed   By: Narda Rutherford M.D.   On: 09/25/2019 05:57   VAS [place] LOWER EXTREMITY VENOUS (DVT)  Result Date: 09/25/2019  Lower Venous Study Indications: Acute respiratory  failure with hypoxia.  Performing Technologist: Marilynne Halsted RDMS, RVT  Examination Guidelines: A complete evaluation includes B-mode imaging, spectral Doppler, color Doppler, and power Doppler as needed of all accessible portions of each vessel. Bilateral testing is considered an integral part of a complete examination. Limited examinations for reoccurring indications may be performed as noted.  +---------+---------------+---------+-----------+----------+--------------+ RIGHT    CompressibilityPhasicitySpontaneityPropertiesThrombus Aging +---------+---------------+---------+-----------+----------+--------------+ CFV      Full           Yes      Yes                                 +---------+---------------+---------+-----------+----------+--------------+ SFJ      Full                                                        +---------+---------------+---------+-----------+----------+--------------+ FV Prox  Full                                                        +---------+---------------+---------+-----------+----------+--------------+ FV Mid   Full                                                        +---------+---------------+---------+-----------+----------+--------------+  FV DistalFull                                                        +---------+---------------+---------+-----------+----------+--------------+ PFV      Full                                                        +---------+---------------+---------+-----------+----------+--------------+ POP      Full           Yes      Yes                                 +---------+---------------+---------+-----------+----------+--------------+ PTV      Full                                                        +---------+---------------+---------+-----------+----------+--------------+ PERO     Full                                                         +---------+---------------+---------+-----------+----------+--------------+   +---------+---------------+---------+-----------+----------+--------------+ LEFT     CompressibilityPhasicitySpontaneityPropertiesThrombus Aging +---------+---------------+---------+-----------+----------+--------------+ CFV      Full           Yes      Yes                                 +---------+---------------+---------+-----------+----------+--------------+ SFJ      Full                                                        +---------+---------------+---------+-----------+----------+--------------+ FV Prox  Full                                                        +---------+---------------+---------+-----------+----------+--------------+ FV Mid   Full                                                        +---------+---------------+---------+-----------+----------+--------------+ FV DistalFull                                                        +---------+---------------+---------+-----------+----------+--------------+  PFV      Full                                                        +---------+---------------+---------+-----------+----------+--------------+ POP      Full           Yes      Yes                                 +---------+---------------+---------+-----------+----------+--------------+ PTV      Full                                                        +---------+---------------+---------+-----------+----------+--------------+ PERO     Full                                                        +---------+---------------+---------+-----------+----------+--------------+     Summary: Right: There is no evidence of deep vein thrombosis in the lower extremity. No cystic structure found in the popliteal fossa. Left: There is no evidence of deep vein thrombosis in the lower extremity. No cystic structure found in the popliteal fossa.  *See  table(s) above for measurements and observations.    Preliminary     Assessment/Plan: [name] [name] underwent a a C3-4, C4-5, and C5-6 ACDF by Dr. Arnoldo Morale on 09/21/2019. He initially did well post-operatively and was ambulatinf in the hallways. He developed some urinary retention on 09/22/2019 and a catheter was placed. He having urological issues in the past. On 09/23/2019, he went into respiratory distress and was transferred to the ICU to be placed on BiPAP. CCM was consulted for airway management. Lower extremity venous dopplers negative for DVT on 09/25/2019.   LOS: 4 days    -BiPAP per CCM -PT/OT as patient is able to tolerate  Viona Gilmore, DNP, AGNP-C Nurse Practitioner  [facility] [place]. [address], [place], [place] [address] P: [phone]    F: [phone]  09/25/2019, 2:10 PM

## 2019-09-25 NOTE — Progress Notes (Signed)
Venous duplex lower ext  has been completed. Refer to [facility] under chart review to view preliminary results.   09/25/2019  12:42 PM [name] [name], Bonnye Fava

## 2019-09-25 NOTE — Progress Notes (Signed)
Name: [name] [name] MRN: [id number] DOB: Jun 05, 1951    ADMISSION DATE:  09/21/2019 CONSULTATION DATE:  09/25/2019  REFERRING MD :  Quincy Carnes  CHIEF COMPLAINT: Respiratory distress  BRIEF PATIENT DESCRIPTION: 68 year old man with OSA, hypertension, atrial fibrillation underwent multilevel ACDF on 12/17 and developed acute respiratory distress 12/18 requiring transfer to ICU  SIGNIFICANT EVENTS  12/19 transferred to ICU for respiratory distress  12/21 tolerating BiPAP more so than HFNC   STUDIES:  CXR 12/21>Low lung volumes. Cardiomegaly.  atelectasis, elevated R hemidiaphragm.   SUBJECTIVE:  Anxious appearing on HFNC   VITAL SIGNS: Temp:  [97 F (36.1 C)-98.4 F (36.9 C)] 97.9 F (36.6 C) (12/21 0400) Pulse Rate:  [65-87] 84 (12/21 0530) Resp:  [10-28] 28 (12/21 0530) BP: (116-174)/(62-79) 144/79 (12/21 0700) SpO2:  [87 %-95 %] 87 % (12/21 0530) FiO2 (%):  [50 %] 50 % (12/20 1853)  PHYSICAL EXAMINATION: Gen. Pleasant, obese older adult M, reclined in Bed in mild distress on HFNC  HEENT - NCAT. Anicteric sclera. Pink mmm. Trachea midline. Incision well approximated.  Lungs: No stridor or wheeze. Diminished bibasilar sounds. Symmetrical chest expansion. Some abdominal muscle recruitment on HFNC  Cardiovascular: RRR s1s2 no rgm. Trace edema.  Abdomen: Soft obese ndnt. + bowel sounds  Musculoskeletal: No obvious deformity, no cyanosis or clubbing  Neuro:  AAOx4 following commands     Recent Labs  Lab 09/19/19 1030 09/25/19 0307  NA 138 139  K 3.9 4.5  CL 105 102  CO2 25 26  BUN 12 35*  CREATININE 0.74 0.95  GLUCOSE 99 160*   Recent Labs  Lab 09/19/19 1030 09/25/19 0307  HGB 15.2 13.2  HCT 45.2 39.5  WBC 9.8 14.6*  PLT 312 297   DG Chest Port 1 View  Result Date: 09/25/2019 CLINICAL DATA:  Acute respiratory failure. EXAM: PORTABLE CHEST 1 VIEW COMPARISON:  Radiograph 09/23/2019 FINDINGS: Low lung volumes limit assessment. Post median sternotomy.  Cardiomegaly. Elevated right hemidiaphragm versus volume loss at the right lung base. Streaky left lung base atelectasis. Improved vascular congestion. No pneumothorax. IMPRESSION: 1. Low lung volumes. 2. Elevated right hemidiaphragm versus volume loss at the right lung base, this may be due to atelectasis/collapse or pleural fluid. 3. Cardiomegaly.  Improved vascular congestion. Electronically Signed   By: Narda Rutherford M.D.   On: 09/25/2019 05:57    ASSESSMENT / PLAN:  Acute hypoxic respiratory failure-  In part related to post operative swelling s/p ACDF but this is improving and concerns for upper airway compromise are lessened -Suspect large component of atelectasis, possible effusion? -Appears anxious on HFNC and states he is more comfortable on BiPAP -Able to speak in full sentences on HFNC P -Resume BiPAP  -NPO on BiPAP. If subjective work of breathing does not improve, may need advanced airway  -IS when not on BiPAP  -Decadron 10 every 6 per neurosurgery, monitor CBGs -For completeness will check BLE venous duplex to assess for VTE  -Daily Lasix as below, will add additional dose today   Hypertension/atrial fibrillation -resume p.o. meds including Lopressor and Cardizem -qD lasix  -Ma goal 2 K goal 4   Leukocytosis -likely r/t steroids; afebrile P -Continue to trend WBC, fever curve. No utility for abx at present  CRITICAL CARE Performed by: Lanier Clam   Total critical care time: 35  minutes  Critical care time was exclusive of separately billable procedures and treating other patients. Critical care was necessary to treat or prevent imminent or life-threatening  deterioration.  Critical care was time spent personally by me on the following activities: development of treatment plan with patient and/or surrogate as well as nursing, discussions with consultants, evaluation of patient's response to treatment, examination of patient, obtaining history from patient or  surrogate, ordering and performing treatments and interventions, ordering and review of laboratory studies, ordering and review of radiographic studies, pulse oximetry and re-evaluation of patient's condition.   Eliseo Gum MSN, AGACNP-BC [place] [phone] If no answer, [phone] 09/25/2019, 8:35 AM

## 2019-09-25 NOTE — Progress Notes (Signed)
PT Cancellation Note  Patient Details Name: [name] [name] MRN: [id number] DOB: 1951-09-26   Cancelled Treatment:    Reason Eval/Treat Not Completed: Medical issues which prohibited therapy.  Pt with increase WOB.  RN asks to hold today. 09/25/2019  Ginger Carne., PT Acute Rehabilitation Services [phone]  (pager) [phone]  (office)   [name] [name] [name] 09/25/2019, 4:48 PM

## 2019-09-26 ENCOUNTER — Inpatient Hospital Stay (HOSPITAL_COMMUNITY): Payer: Medicare Other

## 2019-09-26 DIAGNOSIS — I4891 Unspecified atrial fibrillation: Secondary | ICD-10-CM

## 2019-09-26 LAB — BASIC METABOLIC PANEL
Anion gap: 12 (ref 5–15)
BUN: 47 mg/dL — ABNORMAL HIGH (ref 8–23)
CO2: 28 mmol/L (ref 22–32)
Calcium: 9.5 mg/dL (ref 8.9–10.3)
Chloride: 100 mmol/L (ref 98–111)
Creatinine, Ser: 1.02 mg/dL (ref 0.61–1.24)
GFR calc Af Amer: >60 mL/min (ref >60)
GFR calc non Af Amer: >60 mL/min (ref >60)
Glucose, Bld: 158 mg/dL — ABNORMAL HIGH (ref 70–99)
Potassium: 3.8 mmol/L (ref 3.5–5.1)
Sodium: 140 mmol/L (ref 135–145)

## 2019-09-26 LAB — HEPARIN LEVEL (UNFRACTIONATED): Heparin Unfractionated: 0.84 IU/mL — ABNORMAL HIGH (ref 0.30–0.70)

## 2019-09-26 LAB — D-DIMER, QUANTITATIVE: D-Dimer, Quant: 1.05 ug/mL-FEU — ABNORMAL HIGH (ref 0.00–0.50)

## 2019-09-26 MED ORDER — HEPARIN (PORCINE) 25000 UT/250ML-% IV SOLN
800.0000 [IU]/h | INTRAVENOUS | Status: DC
  Administered 2019-09-26: 1350 [IU]/h via INTRAVENOUS
  Administered 2019-09-27: 1150 [IU]/h via INTRAVENOUS
  Administered 2019-09-28: 750 [IU]/h via INTRAVENOUS
  Administered 2019-09-30 – 2019-10-02 (×3): 800 [IU]/h via INTRAVENOUS
  Filled 2019-09-26 (×7): qty 250

## 2019-09-26 MED ORDER — METOPROLOL TARTRATE 25 MG PO TABS
12.5000 mg | ORAL_TABLET | Freq: Two times a day (BID) | ORAL | Status: DC
  Administered 2019-09-26 – 2019-09-27 (×3): 12.5 mg via ORAL
  Filled 2019-09-26 (×4): qty 1

## 2019-09-26 MED ORDER — METOPROLOL TARTRATE 5 MG/5ML IV SOLN
5.0000 mg | Freq: Once | INTRAVENOUS | Status: AC
  Administered 2019-09-26: 5 mg via INTRAVENOUS

## 2019-09-26 MED ORDER — LORAZEPAM 2 MG/ML IJ SOLN
0.5000 mg | INTRAMUSCULAR | Status: DC | PRN
  Administered 2019-09-26 – 2019-10-02 (×8): 0.5 mg via INTRAVENOUS
  Filled 2019-09-26 (×8): qty 1

## 2019-09-26 MED ORDER — LORAZEPAM 2 MG/ML IJ SOLN
1.0000 mg | Freq: Once | INTRAMUSCULAR | Status: AC
  Administered 2019-09-26: 1 mg via INTRAVENOUS

## 2019-09-26 NOTE — Progress Notes (Signed)
ANTICOAGULATION CONSULT NOTE - Follow Up Consult  Pharmacy Consult for heparin Indication: atrial fibrillation  Allergies  Allergen Reactions  . Vancomycin Anaphylaxis, Shortness Of Breath and Other (See Comments)    Was informed after heart surgery Breathing problem patient unsure  . Aldactone [Spironolactone]     GYNECOMASTIA   . Chlorthalidone Other (See Comments)    GOUT  . Nsaids Other (See Comments)    On blood thinner    Patient Measurements: Height: 5\' 6"  (167.6 cm) Weight: 241 lb (109.3 kg) IBW/kg (Calculated) : 63.8  Vital Signs: Temp: 98.8 F (37.1 C) (12/22 0400) Temp Source: Axillary (12/22 0400) BP: 136/99 (12/22 1100) Pulse Rate: 69 (12/22 1100)  Labs: Recent Labs    09/25/19 0307 09/25/19 1614 09/26/19 0622  HGB 13.2 14.3  --   HCT 39.5 42.0  --   PLT 297  --   --   CREATININE 0.95  --  1.02    Estimated Creatinine Clearance: 82.6 mL/min (by C-G formula based on SCr of 1.02 mg/dL).  Assessment: 68 yo m presenting for NS - on apixaban pta for afib   To begin heparin today  CBC stable  Goal of Therapy:  Heparin level 0.3-0.7 units/ml Monitor platelets by anticoagulation protocol: Yes   Plan:  Heparin 1350 units/hr Initial HL 2000 Daily HL CBC F/U return to apixaban  Barth Kirks, PharmD, BCPS, BCCCP Clinical Pharmacist [phone]  Please check AMION for all [place] numbers  09/26/2019 11:11 AM

## 2019-09-26 NOTE — Progress Notes (Signed)
Name: [name] [name] MRN: [id number] DOB: 01-26-1951    ADMISSION DATE:  09/21/2019 CONSULTATION DATE:  09/26/2019  REFERRING MD :  Quincy Carnes  CHIEF COMPLAINT: Respiratory distress  BRIEF PATIENT DESCRIPTION: 68 year old man with OSA, hypertension, atrial fibrillation underwent multilevel ACDF on 12/17 and developed acute respiratory distress 12/18 requiring transfer to ICU  SIGNIFICANT EVENTS  12/19 transferred to ICU for respiratory distress  12/21 tolerating BiPAP more so than HFNC   STUDIES:  CXR 12/21>Low lung volumes. Cardiomegaly.  atelectasis, elevated R hemidiaphragm.  12/21 BLE Venous doppler>> neg for DVT   SUBJECTIVE:  Afib with RVR this am - improved.  Hypoxia continues despite bipap. Hematoma appears improved  Says WOB is better.   VITAL SIGNS: Temp:  [97.4 F (36.3 C)-98.8 F (37.1 C)] 98.8 F (37.1 C) (12/22 0400) Pulse Rate:  [59-115] 64 (12/22 0500) Resp:  [12-30] 14 (12/22 0500) BP: (123-172)/(65-84) 143/70 (12/22 0500) SpO2:  [91 %-100 %] 93 % (12/22 0500) FiO2 (%):  [60 %-70 %] 70 % (12/22 0829)  PHYSICAL EXAMINATION: Gen. Pleasant, obese older adult M, NAD on bipap  HEENT - mm moist, incision c/d, no obvious hematoma or swelling  Lungs: resps even non labored on bipap, diminished throughout  Cardiovascular: RRR s1s2 no rgm. Trace edema.  Abdomen: Soft obese ndnt. + bowel sounds  Musculoskeletal: No obvious deformity, no cyanosis or clubbing  Neuro:  AAOx4 following commands     Recent Labs  Lab 09/19/19 1030 09/25/19 0307 09/25/19 1614 09/26/19 0622  NA 138 139 138 140  K 3.9 4.5 3.7 3.8  CL 105 102  --  100  CO2 25 26  --  28  BUN 12 35*  --  47*  CREATININE 0.74 0.95  --  1.02  GLUCOSE 99 160*  --  158*   Recent Labs  Lab 09/19/19 1030 09/25/19 0307 09/25/19 1614  HGB 15.2 13.2 14.3  HCT 45.2 39.5 42.0  WBC 9.8 14.6*  --   PLT 312 297  --    DG Chest Port 1 View  Result Date: 09/26/2019 CLINICAL DATA:  Respiratory  failure. EXAM: PORTABLE CHEST 1 VIEW COMPARISON:  09/25/2019. FINDINGS: Prior CABG. Stable cardiomegaly. Low lung volumes with persistent bibasilar atelectasis. Similar findings noted on prior exam. No prominent pleural effusion. No pneumothorax. Prior cervical spine fusion. Carotid vascular calcification. IMPRESSION: 1.  Prior CABG.  Stable cardiomegaly. 2. Low lung volumes with persistent bibasilar atelectasis. Similar findings noted on prior exam. 3.  Carotid vascular disease. Electronically Signed   By: Maisie Fus  Register   On: 09/26/2019 06:40   DG Chest Port 1 View  Result Date: 09/25/2019 CLINICAL DATA:  Acute respiratory failure. EXAM: PORTABLE CHEST 1 VIEW COMPARISON:  Radiograph 09/23/2019 FINDINGS: Low lung volumes limit assessment. Post median sternotomy. Cardiomegaly. Elevated right hemidiaphragm versus volume loss at the right lung base. Streaky left lung base atelectasis. Improved vascular congestion. No pneumothorax. IMPRESSION: 1. Low lung volumes. 2. Elevated right hemidiaphragm versus volume loss at the right lung base, this may be due to atelectasis/collapse or pleural fluid. 3. Cardiomegaly.  Improved vascular congestion. Electronically Signed   By: Narda Rutherford M.D.   On: 09/25/2019 05:57   VAS [place] LOWER EXTREMITY VENOUS (DVT)  Result Date: 09/25/2019  Lower Venous Study Indications: Acute respiratory failure with hypoxia.  Performing Technologist: Marilynne Halsted RDMS, RVT  Examination Guidelines: A complete evaluation includes B-mode imaging, spectral Doppler, color Doppler, and power Doppler as needed of all accessible portions of each  vessel. Bilateral testing is considered an integral part of a complete examination. Limited examinations for reoccurring indications may be performed as noted.  +---------+---------------+---------+-----------+----------+--------------+ RIGHT    CompressibilityPhasicitySpontaneityPropertiesThrombus Aging  +---------+---------------+---------+-----------+----------+--------------+ CFV      Full           Yes      Yes                                 +---------+---------------+---------+-----------+----------+--------------+ SFJ      Full                                                        +---------+---------------+---------+-----------+----------+--------------+ FV Prox  Full                                                        +---------+---------------+---------+-----------+----------+--------------+ FV Mid   Full                                                        +---------+---------------+---------+-----------+----------+--------------+ FV DistalFull                                                        +---------+---------------+---------+-----------+----------+--------------+ PFV      Full                                                        +---------+---------------+---------+-----------+----------+--------------+ POP      Full           Yes      Yes                                 +---------+---------------+---------+-----------+----------+--------------+ PTV      Full                                                        +---------+---------------+---------+-----------+----------+--------------+ PERO     Full                                                        +---------+---------------+---------+-----------+----------+--------------+   +---------+---------------+---------+-----------+----------+--------------+ LEFT     CompressibilityPhasicitySpontaneityPropertiesThrombus Aging +---------+---------------+---------+-----------+----------+--------------+ CFV      Full  Yes      Yes                                 +---------+---------------+---------+-----------+----------+--------------+ SFJ      Full                                                         +---------+---------------+---------+-----------+----------+--------------+ FV Prox  Full                                                        +---------+---------------+---------+-----------+----------+--------------+ FV Mid   Full                                                        +---------+---------------+---------+-----------+----------+--------------+ FV DistalFull                                                        +---------+---------------+---------+-----------+----------+--------------+ PFV      Full                                                        +---------+---------------+---------+-----------+----------+--------------+ POP      Full           Yes      Yes                                 +---------+---------------+---------+-----------+----------+--------------+ PTV      Full                                                        +---------+---------------+---------+-----------+----------+--------------+ PERO     Full                                                        +---------+---------------+---------+-----------+----------+--------------+     Summary: Right: There is no evidence of deep vein thrombosis in the lower extremity. No cystic structure found in the popliteal fossa. Left: There is no evidence of deep vein thrombosis in the lower extremity. No cystic structure found in the popliteal fossa.  *See table(s) above for measurements and observations. Electronically signed by Curt Jews MD on 09/25/2019 at 5:39:55 PM.    Final  ASSESSMENT / PLAN:  Acute hypoxic respiratory failure- suspect multifactorial in setting post op swelling and hematoma s/p ACDF likely c/b mild volume overload. Hematoma and volume overload appear improved but increased oxygen demands and bipap needs continue. ??diaphragmatic injury v PE (no DVT on BLE dopplers).  P -continue BiPAP  -NPO on BiPAP.  -pulmonary hygiene as able  -Decadron  discontinued by nsgy  -continue gentle diuresis  - HR control  - consider CTA chest r/o PE although when ok with nsgy he needs anticoagulation regardless for PAF  Hypertension/atrial fibrillation AFib with RVR  PLAN -  - continue cardizem, lopressor  - lasix as above  - heparin per pharmacy if ok with nsgy    Leukocytosis -likely r/t steroids; afebrile P -Continue to trend WBC, fever curve. No utility for abx at present    Total critical care time: 35  minutes   Dirk Dress, NP Pulmonary/Critical Care Medicine  09/26/2019  8:54 AM

## 2019-09-26 NOTE — Progress Notes (Signed)
PT Cancellation Note  Patient Details Name: [name] [name] MRN: [id number] DOB: Aug 04, 1951   Cancelled Treatment:    Reason Eval/Treat Not Completed: Medical issues which prohibited therapy.  Pt still not doing well with increased work of breathing.  RN asked to hold again. Will continue to try to see each day until pt is appropriate to participate. 09/26/2019  [name] Carne., PT Acute Rehabilitation Services [phone]  (pager) [phone]  (office)   [name] [name] 09/26/2019, 2:30 PM

## 2019-09-26 NOTE — Progress Notes (Signed)
ANTICOAGULATION CONSULT NOTE - Follow Up Consult  Pharmacy Consult for heparin Indication: atrial fibrillation  Allergies  Allergen Reactions  . Vancomycin Anaphylaxis, Shortness Of Breath and Other (See Comments)    Was informed after heart surgery Breathing problem patient unsure  . Aldactone [Spironolactone]     GYNECOMASTIA   . Chlorthalidone Other (See Comments)    GOUT  . Nsaids Other (See Comments)    On blood thinner    Patient Measurements: Height: 5\' 6"  (167.6 cm) Weight: 241 lb (109.3 kg) IBW/kg (Calculated) : 63.8  Heparin Dosing Weight: 89 kg  Vital Signs: Temp: 98.2 F (36.8 C) (12/22 2000) Temp Source: Axillary (12/22 2000) BP: 172/90 (12/22 1900) Pulse Rate: 105 (12/22 1900)  Labs: Recent Labs    09/25/19 0307 09/25/19 1614 09/26/19 0622 09/26/19 2034  HGB 13.2 14.3  --   --   HCT 39.5 42.0  --   --   PLT 297  --   --   --   HEPARINUNFRC  --   --   --  0.84*  CREATININE 0.95  --  1.02  --     Estimated Creatinine Clearance: 82.6 mL/min (by C-G formula based on SCr of 1.02 mg/dL).  Assessment: 68 yr old male presenting for neurosurgery - on apixaban pta for afib   Patient initiated heparin infusion at 1350 units/hr earlier today; heparin level ~8 hrs after starting infusion was 0.84 units/ml, which is above the goal range for this pt. CBC WNL. Per RN, no issues with IV or bleeding observed.  Goal of Therapy:  Heparin level 0.3-0.7 units/ml Monitor platelets by anticoagulation protocol: Yes   Plan:  Decrease heparin infusion to 1150 units/hr Check 6-hr heparin level Monitor daily heparin level, CBC Monitor for signs/symptoms of bleeding F/U return to apixaban  Gillermina Hu, PharmD, BCPS, [facility] Clinical Pharmacist 09/26/2019 9:18 PM

## 2019-09-26 NOTE — Progress Notes (Signed)
[place] Progress Note Patient Name: [name] [name] DOB: [birth year] MRN: [id number]   Date of Service  09/26/2019  HPI/Events of Note  Increasing confusion on HFNC.   eICU Interventions  Will order: 1. ABG STAT. 2. Will ask ground team to evaluate the patient at bedside.      Intervention Category Major Interventions: Change in mental status - evaluation and management  [name] 09/26/2019, 11:47 PM

## 2019-09-26 NOTE — Progress Notes (Addendum)
Subjective: The patient is alert and pleasant.  He has no respiratory distress.  Objective: Vital signs in last 24 hours: Temp:  [97.4 F (36.3 C)-98.8 F (37.1 C)] 98.8 F (37.1 C) (12/22 0400) Pulse Rate:  [59-115] 64 (12/22 0500) Resp:  [12-30] 14 (12/22 0500) BP: (123-172)/(65-84) 143/70 (12/22 0500) SpO2:  [90 %-100 %] 93 % (12/22 0500) FiO2 (%):  [60 %-70 %] 70 % (12/21 2000) Estimated body mass index is 38.9 kg/m as calculated from the following:   Height as of this encounter: 5\' 6"  (1.676 m).   Weight as of this encounter: 109.3 kg.   Intake/Output from previous day: 12/21 0701 - 12/22 0700 In: 50 [P.O.:50] Out: 1700 [Urine:1700] Intake/Output this shift: No intake/output data recorded.  Physical exam patient is alert and oriented.  He is moving all 4 extremities well.  The patient's wound is without change.  There is some mild swelling without shift.  There is no respiratory distress.  Lab Results: Recent Labs    09/25/19 0307 09/25/19 1614  WBC 14.6*  --   HGB 13.2 14.3  HCT 39.5 42.0  PLT 297  --    BMET Recent Labs    09/25/19 0307 09/25/19 1614  NA 139 138  K 4.5 3.7  CL 102  --   CO2 26  --   GLUCOSE 160*  --   BUN 35*  --   CREATININE 0.95  --   CALCIUM 9.4  --     Studies/Results: DG Chest Port 1 View  Result Date: 09/26/2019 CLINICAL DATA:  Respiratory failure. EXAM: PORTABLE CHEST 1 VIEW COMPARISON:  09/25/2019. FINDINGS: Prior CABG. Stable cardiomegaly. Low lung volumes with persistent bibasilar atelectasis. Similar findings noted on prior exam. No prominent pleural effusion. No pneumothorax. Prior cervical spine fusion. Carotid vascular calcification. IMPRESSION: 1.  Prior CABG.  Stable cardiomegaly. 2. Low lung volumes with persistent bibasilar atelectasis. Similar findings noted on prior exam. 3.  Carotid vascular disease. Electronically Signed   By: Marcello Moores  Register   On: 09/26/2019 06:40   DG Chest Port 1 View  Result Date:  09/25/2019 CLINICAL DATA:  Acute respiratory failure. EXAM: PORTABLE CHEST 1 VIEW COMPARISON:  Radiograph 09/23/2019 FINDINGS: Low lung volumes limit assessment. Post median sternotomy. Cardiomegaly. Elevated right hemidiaphragm versus volume loss at the right lung base. Streaky left lung base atelectasis. Improved vascular congestion. No pneumothorax. IMPRESSION: 1. Low lung volumes. 2. Elevated right hemidiaphragm versus volume loss at the right lung base, this may be due to atelectasis/collapse or pleural fluid. 3. Cardiomegaly.  Improved vascular congestion. Electronically Signed   By: Keith Rake M.D.   On: 09/25/2019 05:57   VAS [place] LOWER EXTREMITY VENOUS (DVT)  Result Date: 09/25/2019  Lower Venous Study Indications: Acute respiratory failure with hypoxia.  Performing Technologist: Oda Cogan RDMS, RVT  Examination Guidelines: A complete evaluation includes B-mode imaging, spectral Doppler, color Doppler, and power Doppler as needed of all accessible portions of each vessel. Bilateral testing is considered an integral part of a complete examination. Limited examinations for reoccurring indications may be performed as noted.  +---------+---------------+---------+-----------+----------+--------------+ RIGHT    CompressibilityPhasicitySpontaneityPropertiesThrombus Aging +---------+---------------+---------+-----------+----------+--------------+ CFV      Full           Yes      Yes                                 +---------+---------------+---------+-----------+----------+--------------+ SFJ  Full                                                        +---------+---------------+---------+-----------+----------+--------------+ FV Prox  Full                                                        +---------+---------------+---------+-----------+----------+--------------+ FV Mid   Full                                                         +---------+---------------+---------+-----------+----------+--------------+ FV DistalFull                                                        +---------+---------------+---------+-----------+----------+--------------+ PFV      Full                                                        +---------+---------------+---------+-----------+----------+--------------+ POP      Full           Yes      Yes                                 +---------+---------------+---------+-----------+----------+--------------+ PTV      Full                                                        +---------+---------------+---------+-----------+----------+--------------+ PERO     Full                                                        +---------+---------------+---------+-----------+----------+--------------+   +---------+---------------+---------+-----------+----------+--------------+ LEFT     CompressibilityPhasicitySpontaneityPropertiesThrombus Aging +---------+---------------+---------+-----------+----------+--------------+ CFV      Full           Yes      Yes                                 +---------+---------------+---------+-----------+----------+--------------+ SFJ      Full                                                        +---------+---------------+---------+-----------+----------+--------------+  FV Prox  Full                                                        +---------+---------------+---------+-----------+----------+--------------+ FV Mid   Full                                                        +---------+---------------+---------+-----------+----------+--------------+ FV DistalFull                                                        +---------+---------------+---------+-----------+----------+--------------+ PFV      Full                                                         +---------+---------------+---------+-----------+----------+--------------+ POP      Full           Yes      Yes                                 +---------+---------------+---------+-----------+----------+--------------+ PTV      Full                                                        +---------+---------------+---------+-----------+----------+--------------+ PERO     Full                                                        +---------+---------------+---------+-----------+----------+--------------+     Summary: Right: There is no evidence of deep vein thrombosis in the lower extremity. No cystic structure found in the popliteal fossa. Left: There is no evidence of deep vein thrombosis in the lower extremity. No cystic structure found in the popliteal fossa.  *See table(s) above for measurements and observations. Electronically signed by Gretta Began MD on 09/25/2019 at 5:39:55 PM.    Final     Assessment/Plan: Postop day #5: The patient is improving.  I have encouraged him to mobilize with PT.  Perhaps we can send him home tomorrow.  I will discontinue his Decadron.  LOS: 5 days     Cristi Loron 09/26/2019, 7:55 AM

## 2019-09-27 ENCOUNTER — Other Ambulatory Visit: Payer: Self-pay

## 2019-09-27 DIAGNOSIS — I10 Essential (primary) hypertension: Secondary | ICD-10-CM

## 2019-09-27 DIAGNOSIS — G9340 Encephalopathy, unspecified: Secondary | ICD-10-CM

## 2019-09-27 LAB — POCT I-STAT 7, (LYTES, BLD GAS, ICA,H+H)
Acid-Base Excess: 7 mmol/L — ABNORMAL HIGH (ref 0.0–2.0)
Bicarbonate: 31.4 mmol/L — ABNORMAL HIGH (ref 20.0–28.0)
Calcium, Ion: 1.25 mmol/L (ref 1.15–1.40)
HCT: 42 % (ref 39.0–52.0)
Hemoglobin: 14.3 g/dL (ref 13.0–17.0)
O2 Saturation: 97 %
Patient temperature: 98
Potassium: 3.5 mmol/L (ref 3.5–5.1)
Sodium: 140 mmol/L (ref 135–145)
TCO2: 33 mmol/L — ABNORMAL HIGH (ref 22–32)
pCO2 arterial: 42.6 mmHg (ref 32.0–48.0)
pH, Arterial: 7.474 — ABNORMAL HIGH (ref 7.350–7.450)
pO2, Arterial: 81 mmHg — ABNORMAL LOW (ref 83.0–108.0)

## 2019-09-27 LAB — TROPONIN I (HIGH SENSITIVITY): Troponin I (High Sensitivity): 34 ng/L — ABNORMAL HIGH (ref <18)

## 2019-09-27 LAB — BASIC METABOLIC PANEL
Anion gap: 10 (ref 5–15)
BUN: 49 mg/dL — ABNORMAL HIGH (ref 8–23)
CO2: 29 mmol/L (ref 22–32)
Calcium: 9.1 mg/dL (ref 8.9–10.3)
Chloride: 101 mmol/L (ref 98–111)
Creatinine, Ser: 1.13 mg/dL (ref 0.61–1.24)
GFR calc Af Amer: >60 mL/min (ref >60)
GFR calc non Af Amer: >60 mL/min (ref >60)
Glucose, Bld: 146 mg/dL — ABNORMAL HIGH (ref 70–99)
Potassium: 3.6 mmol/L (ref 3.5–5.1)
Sodium: 140 mmol/L (ref 135–145)

## 2019-09-27 LAB — HEPARIN LEVEL (UNFRACTIONATED)
Heparin Unfractionated: 1.64 IU/mL — ABNORMAL HIGH (ref 0.30–0.70)
Heparin Unfractionated: 1.06 IU/mL — ABNORMAL HIGH (ref 0.30–0.70)

## 2019-09-27 LAB — CBC
HCT: 41.2 % (ref 39.0–52.0)
Hemoglobin: 14.4 g/dL (ref 13.0–17.0)
MCH: 29.8 pg (ref 26.0–34.0)
MCHC: 35 g/dL (ref 30.0–36.0)
MCV: 85.1 fL (ref 80.0–100.0)
Platelets: 338 10*3/uL (ref 150–400)
RBC: 4.84 MIL/uL (ref 4.22–5.81)
RDW: 12.9 % (ref 11.5–15.5)
WBC: 11.3 10*3/uL — ABNORMAL HIGH (ref 4.0–10.5)
nRBC: 0 % (ref 0.0–0.2)

## 2019-09-27 LAB — MAGNESIUM: Magnesium: 2.4 mg/dL (ref 1.7–2.4)

## 2019-09-27 LAB — PHOSPHORUS: Phosphorus: 2.9 mg/dL (ref 2.5–4.6)

## 2019-09-27 MED ORDER — DEXMEDETOMIDINE HCL IN NACL 400 MCG/100ML IV SOLN
0.4000 ug/kg/h | INTRAVENOUS | Status: DC
  Administered 2019-09-27 (×2): 0.4 ug/kg/h via INTRAVENOUS
  Administered 2019-09-27: 0.5 ug/kg/h via INTRAVENOUS
  Administered 2019-09-27: 1 ug/kg/h via INTRAVENOUS
  Administered 2019-09-28 – 2019-09-29 (×2): 0.3 ug/kg/h via INTRAVENOUS
  Filled 2019-09-27 (×5): qty 100

## 2019-09-27 NOTE — Progress Notes (Signed)
Subjective: By report the patient had some anxiety and respiratory distress last night.  He was sedated on Precedex.  He is resting comfortably now.  Objective: Vital signs in last 24 hours: Temp:  [97.4 F (36.3 C)-98.2 F (36.8 C)] 98 F (36.7 C) (12/23 0400) Pulse Rate:  [43-105] 45 (12/23 0700) Resp:  [14-29] 17 (12/23 0700) BP: (120-184)/(62-118) 159/82 (12/23 0700) SpO2:  [88 %-100 %] 100 % (12/23 0700) FiO2 (%):  [15 %-70 %] 60 % (12/23 0757) Estimated body mass index is 38.9 kg/m as calculated from the following:   Height as of this encounter: 5\' 6"  (1.676 m).   Weight as of this encounter: 109.3 kg.   Intake/Output from previous day: 12/22 0701 - 12/23 0700 In: 367.5 [I.V.:367.5] Out: 1925 [Urine:1925] Intake/Output this shift: No intake/output data recorded.  Physical exam the patient is somnolent but arousable.  He is moving all 4 extremities.  The patient's wound is without change.  There is a palpable swelling but no shift.  He is not stridorous or having any difficulty breathing.  He remains hypoxic.  Lab Results: Recent Labs    09/25/19 0307 09/25/19 1614 09/27/19 0122  WBC 14.6*  --  11.3*  HGB 13.2 14.3 14.4  HCT 39.5 42.0 41.2  PLT 297  --  338   BMET Recent Labs    09/26/19 0622 09/27/19 0122  NA 140 140  K 3.8 3.6  CL 100 101  CO2 28 29  GLUCOSE 158* 146*  BUN 47* 49*  CREATININE 1.02 1.13  CALCIUM 9.5 9.1    Studies/Results: DG Chest Port 1 View  Result Date: 09/26/2019 CLINICAL DATA:  Respiratory failure. EXAM: PORTABLE CHEST 1 VIEW COMPARISON:  09/25/2019. FINDINGS: Prior CABG. Stable cardiomegaly. Low lung volumes with persistent bibasilar atelectasis. Similar findings noted on prior exam. No prominent pleural effusion. No pneumothorax. Prior cervical spine fusion. Carotid vascular calcification. IMPRESSION: 1.  Prior CABG.  Stable cardiomegaly. 2. Low lung volumes with persistent bibasilar atelectasis. Similar findings noted on  prior exam. 3.  Carotid vascular disease. Electronically Signed   By: 09/27/2019  Register   On: 09/26/2019 06:40   VAS 09/28/2019 LOWER EXTREMITY VENOUS (DVT)  Result Date: 09/25/2019  Lower Venous Study Indications: Acute respiratory failure with hypoxia.  Performing Technologist: 09/27/2019 RDMS, RVT  Examination Guidelines: A complete evaluation includes B-mode imaging, spectral Doppler, color Doppler, and power Doppler as needed of all accessible portions of each vessel. Bilateral testing is considered an integral part of a complete examination. Limited examinations for reoccurring indications may be performed as noted.  +---------+---------------+---------+-----------+----------+--------------+ RIGHT    CompressibilityPhasicitySpontaneityPropertiesThrombus Aging +---------+---------------+---------+-----------+----------+--------------+ CFV      Full           Yes      Yes                                 +---------+---------------+---------+-----------+----------+--------------+ SFJ      Full                                                        +---------+---------------+---------+-----------+----------+--------------+ FV Prox  Full                                                        +---------+---------------+---------+-----------+----------+--------------+  FV Mid   Full                                                        +---------+---------------+---------+-----------+----------+--------------+ FV DistalFull                                                        +---------+---------------+---------+-----------+----------+--------------+ PFV      Full                                                        +---------+---------------+---------+-----------+----------+--------------+ POP      Full           Yes      Yes                                 +---------+---------------+---------+-----------+----------+--------------+ PTV      Full                                                         +---------+---------------+---------+-----------+----------+--------------+ PERO     Full                                                        +---------+---------------+---------+-----------+----------+--------------+   +---------+---------------+---------+-----------+----------+--------------+ LEFT     CompressibilityPhasicitySpontaneityPropertiesThrombus Aging +---------+---------------+---------+-----------+----------+--------------+ CFV      Full           Yes      Yes                                 +---------+---------------+---------+-----------+----------+--------------+ SFJ      Full                                                        +---------+---------------+---------+-----------+----------+--------------+ FV Prox  Full                                                        +---------+---------------+---------+-----------+----------+--------------+ FV Mid   Full                                                        +---------+---------------+---------+-----------+----------+--------------+  FV DistalFull                                                        +---------+---------------+---------+-----------+----------+--------------+ PFV      Full                                                        +---------+---------------+---------+-----------+----------+--------------+ POP      Full           Yes      Yes                                 +---------+---------------+---------+-----------+----------+--------------+ PTV      Full                                                        +---------+---------------+---------+-----------+----------+--------------+ PERO     Full                                                        +---------+---------------+---------+-----------+----------+--------------+     Summary: Right: There is no evidence of deep vein thrombosis in the  lower extremity. No cystic structure found in the popliteal fossa. Left: There is no evidence of deep vein thrombosis in the lower extremity. No cystic structure found in the popliteal fossa.  *See table(s) above for measurements and observations. Electronically signed by Curt Jews MD on 09/25/2019 at 5:39:55 PM.    Final     Assessment/Plan: Postop day #6: The patient is sedated, but stable neurologically.  He does have a prevertebral hematoma but does not have any difficulty breathing.  I do not think this would explain his hypoxia.  I will leave this up to pulmonary's expertise.  LOS: 6 days     Ophelia Charter 09/27/2019, 8:13 AM

## 2019-09-27 NOTE — Progress Notes (Signed)
Called to bedside to evaluate for increasing confusion.  RN reports patent has mostly been on BiPAP but has been restless despite ativan and pulling mask off.  Currently on HFNC for a break.   Patient complains of pain in his chest (which is reproducible) and some nausea.  No reported earlier nausea/ vomiting.  Patient has not ate but had sips of clear liquids throughout the day.   Blood pressure (!) 147/118, pulse 77, temperature 98 F (36.7 C), temperature source Axillary, resp. rate (!) 23, height 5\' 6"  (1.676 m), weight 109.3 kg, SpO2 99 %.   Older male restless in bed in mittens, oriented to self only, moving all extremities, no difficulty in speech, follows simple commands, tachypneic with belly breathing with clear anterior breath sounds, diminished in bases, no LE edema.   On chart review, no fever spikes, CXR from AM reviewed.    ABG performed, reviewed, no acute hypercarbia  P:  EKG and trend trop hs given cardiac hx- EKG with poor tracing but no obvious STE changes, QTc ok  Will start precedex for agitation for RASS goal 0/-1 Placed back on BiPAP with immediate improvement in labored breathing.  Will need to monitored closely if nausea worsens.  Patient is coherent enough to be able to pull mask off if needed.  Can consider zofran if needed.  Will need BIPAP at night and when napping for OSA and trial of HFNC during day.  Remains high risk for intubation.      Kennieth Rad, MSN, AGACNP-BC [facility] 09/27/2019, 12:32 AM

## 2019-09-27 NOTE — Progress Notes (Signed)
ANTICOAGULATION CONSULT NOTE - Follow Up Consult  Pharmacy Consult for heparin Indication: atrial fibrillation  Allergies  Allergen Reactions  . Vancomycin Anaphylaxis, Shortness Of Breath and Other (See Comments)    Was informed after heart surgery Breathing problem patient unsure  . Aldactone [Spironolactone]     GYNECOMASTIA   . Chlorthalidone Other (See Comments)    GOUT  . Nsaids Other (See Comments)    On blood thinner    Patient Measurements: Height: 5\' 6"  (167.6 cm) Weight: 241 lb (109.3 kg) IBW/kg (Calculated) : 63.8  Heparin Dosing Weight: 89 kg  Vital Signs: Temp: 97.3 F (36.3 C) (12/23 1200) Temp Source: Axillary (12/23 1200) BP: 159/82 (12/23 0700) Pulse Rate: 45 (12/23 0700)  Labs: Recent Labs    09/25/19 0307 09/25/19 1614 09/26/19 0622 09/26/19 2034 09/27/19 0122 09/27/19 1023  HGB 13.2 14.3  --   --  14.4  --   HCT 39.5 42.0  --   --  41.2  --   PLT 297  --   --   --  338  --   HEPARINUNFRC  --   --   --  0.84* 1.64* 1.06*  CREATININE 0.95  --  1.02  --  1.13  --   TROPONINIHS  --   --   --   --  34*  --     Estimated Creatinine Clearance: 74.6 mL/min (by C-G formula based on SCr of 1.13 mg/dL).  Assessment: 68 yr old male presenting for neurosurgery - on apixaban pta for afib (last dose 09/17/19).  Pharmacy consulted to dose IV heparin.  Heparin level was elevated at 1.64 this AM around 0130 - repeat level resulted at 1.06, which is still elevated.  Noted patient has a hematoma.  No overt bleeding per RN.  Goal of Therapy:  Heparin level 0.3-0.7 units/ml Monitor platelets by anticoagulation protocol: Yes   Plan:  Reduce heparin gtt to 850 units/hr Check 6 hr heparin level Daily heparin level and CBC  Celine Dishman D. Mina Marble, PharmD, BCPS, [place] 09/27/2019, 1:41 PM

## 2019-09-27 NOTE — Progress Notes (Signed)
Name: [name] [name] MRN: [id number] DOB: [birth year]    ADMISSION DATE:  09/21/2019 CONSULTATION DATE:  09/27/2019  REFERRING MD :  Antony Haste  CHIEF COMPLAINT: Respiratory distress  BRIEF PATIENT DESCRIPTION: 68 year old man with OSA, hypertension, atrial fibrillation underwent multilevel ACDF on 12/17 and developed acute respiratory distress 12/18 requiring transfer to ICU  SIGNIFICANT EVENTS  12/19 transferred to ICU for respiratory distress  12/21 tolerating BiPAP more so than HFNC   STUDIES:  CXR 12/21>Low lung volumes. Cardiomegaly.  atelectasis, elevated R hemidiaphragm.  12/21 BLE Venous doppler>> neg for DVT   SUBJECTIVE:  Confusion overnight No new complaints  VITAL SIGNS: Temp:  [97.4 F (36.3 C)-98.2 F (36.8 C)] 98 F (36.7 C) (12/23 0757) Pulse Rate:  [43-105] 45 (12/23 0700) Resp:  [14-29] 17 (12/23 0700) BP: (126-184)/(64-118) 159/82 (12/23 0700) SpO2:  [88 %-100 %] 100 % (12/23 0700) FiO2 (%):  [15 %-60 %] 60 % (12/23 0757)  PHYSICAL EXAMINATION: Gen. Acutely ill appearing male HEENT: [place]/AT, PERRL, EOM-I and MMM, BiPAP in place, anterior neck hematoma noted Lungs: CTA bilaterally Cardiovascular: RRR, Nl S1/S2 and -M/R/G Abdomen: Soft, NT, ND and +BS Musculoskeletal: No obvious deformity, no cyanosis or clubbing  Neuro:  AAOx4 following commands    I reviewed CXR myself, no acute disease noted  Discussed with PCCM-NP  Recent Labs  Lab 09/25/19 0307 09/25/19 1614 09/26/19 0622 09/27/19 0122  NA 139 138 140 140  K 4.5 3.7 3.8 3.6  CL 102  --  100 101  CO2 26  --  28 29  BUN 35*  --  47* 49*  CREATININE 0.95  --  1.02 1.13  GLUCOSE 160*  --  158* 146*   Recent Labs  Lab 09/25/19 0307 09/25/19 1614 09/27/19 0122  HGB 13.2 14.3 14.4  HCT 39.5 42.0 41.2  WBC 14.6*  --  11.3*  PLT 297  --  338   DG Chest Port 1 View  Result Date: 09/26/2019 CLINICAL DATA:  Respiratory failure. EXAM: PORTABLE CHEST 1 VIEW COMPARISON:  09/25/2019.  FINDINGS: Prior CABG. Stable cardiomegaly. Low lung volumes with persistent bibasilar atelectasis. Similar findings noted on prior exam. No prominent pleural effusion. No pneumothorax. Prior cervical spine fusion. Carotid vascular calcification. IMPRESSION: 1.  Prior CABG.  Stable cardiomegaly. 2. Low lung volumes with persistent bibasilar atelectasis. Similar findings noted on prior exam. 3.  Carotid vascular disease. Electronically Signed   By: Marcello Moores  Register   On: 09/26/2019 06:40   VAS [place] LOWER EXTREMITY VENOUS (DVT)  Result Date: 09/25/2019  Lower Venous Study Indications: Acute respiratory failure with hypoxia.  Performing Technologist: Oda Cogan RDMS, RVT  Examination Guidelines: A complete evaluation includes B-mode imaging, spectral Doppler, color Doppler, and power Doppler as needed of all accessible portions of each vessel. Bilateral testing is considered an integral part of a complete examination. Limited examinations for reoccurring indications may be performed as noted.  +---------+---------------+---------+-----------+----------+--------------+ RIGHT    CompressibilityPhasicitySpontaneityPropertiesThrombus Aging +---------+---------------+---------+-----------+----------+--------------+ CFV      Full           Yes      Yes                                 +---------+---------------+---------+-----------+----------+--------------+ SFJ      Full                                                        +---------+---------------+---------+-----------+----------+--------------+  FV Prox  Full                                                        +---------+---------------+---------+-----------+----------+--------------+ FV Mid   Full                                                        +---------+---------------+---------+-----------+----------+--------------+ FV DistalFull                                                         +---------+---------------+---------+-----------+----------+--------------+ PFV      Full                                                        +---------+---------------+---------+-----------+----------+--------------+ POP      Full           Yes      Yes                                 +---------+---------------+---------+-----------+----------+--------------+ PTV      Full                                                        +---------+---------------+---------+-----------+----------+--------------+ PERO     Full                                                        +---------+---------------+---------+-----------+----------+--------------+   +---------+---------------+---------+-----------+----------+--------------+ LEFT     CompressibilityPhasicitySpontaneityPropertiesThrombus Aging +---------+---------------+---------+-----------+----------+--------------+ CFV      Full           Yes      Yes                                 +---------+---------------+---------+-----------+----------+--------------+ SFJ      Full                                                        +---------+---------------+---------+-----------+----------+--------------+ FV Prox  Full                                                        +---------+---------------+---------+-----------+----------+--------------+  FV Mid   Full                                                        +---------+---------------+---------+-----------+----------+--------------+ FV DistalFull                                                        +---------+---------------+---------+-----------+----------+--------------+ PFV      Full                                                        +---------+---------------+---------+-----------+----------+--------------+ POP      Full           Yes      Yes                                  +---------+---------------+---------+-----------+----------+--------------+ PTV      Full                                                        +---------+---------------+---------+-----------+----------+--------------+ PERO     Full                                                        +---------+---------------+---------+-----------+----------+--------------+     Summary: Right: There is no evidence of deep vein thrombosis in the lower extremity. No cystic structure found in the popliteal fossa. Left: There is no evidence of deep vein thrombosis in the lower extremity. No cystic structure found in the popliteal fossa.  *See table(s) above for measurements and observations. Electronically signed by Gretta Began MD on 09/25/2019 at 5:39:55 PM.    Final     ASSESSMENT / PLAN:  Acute hypoxic respiratory failure- suspect multifactorial in setting post op swelling and hematoma s/p ACDF likely c/b mild volume overload. Hematoma and volume overload appear improved but increased oxygen demands and bipap needs continue. ??diaphragmatic injury v PE (no DVT on BLE dopplers).  P - Continue BiPAP - Change to PRN - Monitor closely for hematoma - SLP - Pulmonary hygiene as able  - Decadron discontinued by nsgy  - Hold further diureses at this point - KVO IVF - HR control  - Consider CTA chest r/o PE although when ok with nsgy he needs anticoagulation regardless for PAF  Hypertension/atrial fibrillation AFib with RVR  PLAN -  - Continue cardizem, lopressor  - Hold further lasix - Heparin per pharmacy if ok with nsgy  - PO anti-coagulation when hematoma is more stable  Leukocytosis -likely r/t steroids; afebrile P - Continue to trend WBC, fever curve. No utility for abx at  present  Hold in the ICU given respiratory failure needs Precedex at night  The patient is critically ill with multiple organ systems failure and requires high complexity decision making for assessment and support,  frequent evaluation and titration of therapies, application of advanced monitoring technologies and extensive interpretation of multiple databases.   Critical Care Time devoted to patient care services described in this note is  33  Minutes. This time reflects time of care of this signee Dr Koren Bound. This critical care time does not reflect procedure time, or teaching time or supervisory time of PA/NP/Med student/Med Resident etc but could involve care discussion time.  Alyson Reedy, M.D. [facility] Pulmonary/Critical Care Medicine

## 2019-09-28 DIAGNOSIS — R001 Bradycardia, unspecified: Secondary | ICD-10-CM

## 2019-09-28 LAB — CBC
HCT: 40.6 % (ref 39.0–52.0)
Hemoglobin: 14.2 g/dL (ref 13.0–17.0)
MCH: 29.6 pg (ref 26.0–34.0)
MCHC: 35 g/dL (ref 30.0–36.0)
MCV: 84.8 fL (ref 80.0–100.0)
Platelets: 266 10*3/uL (ref 150–400)
RBC: 4.79 MIL/uL (ref 4.22–5.81)
RDW: 12.7 % (ref 11.5–15.5)
WBC: 13.7 10*3/uL — ABNORMAL HIGH (ref 4.0–10.5)
nRBC: 0 % (ref 0.0–0.2)

## 2019-09-28 LAB — HEPARIN LEVEL (UNFRACTIONATED)
Heparin Unfractionated: 0.66 IU/mL (ref 0.30–0.70)
Heparin Unfractionated: 0.73 IU/mL — ABNORMAL HIGH (ref 0.30–0.70)
Heparin Unfractionated: 0.4 IU/mL (ref 0.30–0.70)

## 2019-09-28 LAB — BASIC METABOLIC PANEL
Anion gap: 7 (ref 5–15)
BUN: 46 mg/dL — ABNORMAL HIGH (ref 8–23)
CO2: 28 mmol/L (ref 22–32)
Calcium: 8.9 mg/dL (ref 8.9–10.3)
Chloride: 107 mmol/L (ref 98–111)
Creatinine, Ser: 0.96 mg/dL (ref 0.61–1.24)
GFR calc Af Amer: >60 mL/min (ref >60)
GFR calc non Af Amer: >60 mL/min (ref >60)
Glucose, Bld: 144 mg/dL — ABNORMAL HIGH (ref 70–99)
Potassium: 3.6 mmol/L (ref 3.5–5.1)
Sodium: 142 mmol/L (ref 135–145)

## 2019-09-28 LAB — PHOSPHORUS: Phosphorus: 3.1 mg/dL (ref 2.5–4.6)

## 2019-09-28 LAB — MAGNESIUM: Magnesium: 2.7 mg/dL — ABNORMAL HIGH (ref 1.7–2.4)

## 2019-09-28 MED ORDER — FUROSEMIDE 10 MG/ML IJ SOLN
20.0000 mg | Freq: Three times a day (TID) | INTRAMUSCULAR | Status: AC
  Administered 2019-09-28 (×2): 20 mg via INTRAVENOUS
  Filled 2019-09-28 (×2): qty 2

## 2019-09-28 MED ORDER — POTASSIUM CHLORIDE 10 MEQ/50ML IV SOLN: 10.0000 meq | INTRAVENOUS | Status: DC

## 2019-09-28 MED ORDER — POTASSIUM CHLORIDE 20 MEQ PO PACK
20.0000 meq | PACK | Freq: Once | ORAL | Status: AC
  Administered 2019-09-28: 20 meq via ORAL
  Filled 2019-09-28: qty 1

## 2019-09-28 MED ORDER — POTASSIUM CHLORIDE 10 MEQ/100ML IV SOLN
10.0000 meq | INTRAVENOUS | Status: DC
  Administered 2019-09-28 (×2): 10 meq via INTRAVENOUS
  Filled 2019-09-28 (×3): qty 100

## 2019-09-28 MED ORDER — METOPROLOL TARTRATE 12.5 MG HALF TABLET
12.5000 mg | ORAL_TABLET | Freq: Two times a day (BID) | ORAL | Status: DC
  Administered 2019-09-29 – 2019-10-03 (×10): 12.5 mg via ORAL
  Filled 2019-09-28 (×11): qty 1

## 2019-09-28 MED ORDER — METOPROLOL TARTRATE 5 MG/5ML IV SOLN
5.0000 mg | Freq: Once | INTRAVENOUS | Status: AC
  Administered 2019-09-28: 5 mg via INTRAVENOUS

## 2019-09-28 NOTE — Progress Notes (Signed)
Pt stayed on bipap all night long. Tried to transition patient  to HFNC, but patient had increased RR in mid 30s and used accessory muscles. Precedex was off during this time. Precedex was restarted per MD order and patient is back on bipap

## 2019-09-28 NOTE — Progress Notes (Signed)
Name: [name] [name] MRN: [id number] DOB: Feb 10, 1951    ADMISSION DATE:  09/21/2019 CONSULTATION DATE:  09/28/2019  REFERRING MD :  Antony Haste  CHIEF COMPLAINT: Respiratory distress  BRIEF PATIENT DESCRIPTION: 68 year old man with OSA, hypertension, atrial fibrillation underwent multilevel ACDF on 12/17 and developed acute respiratory distress 12/18 requiring transfer to ICU  SIGNIFICANT EVENTS  12/19 transferred to ICU for respiratory distress  12/21 tolerating BiPAP more so than HFNC   STUDIES:  CXR 12/21>Low lung volumes. Cardiomegaly.  atelectasis, elevated R hemidiaphragm.  12/21 BLE Venous doppler>> neg for DVT   SUBJECTIVE:  Episode of confusion overnight and increased WOB that responded to BiPAP and precdex Loletha Grayer this AM  VITAL SIGNS: Temp:  [96.9 F (36.1 C)-97.7 F (36.5 C)] 96.9 F (36.1 C) (12/24 0800) Pulse Rate:  [30-88] 56 (12/24 0730) Resp:  [10-29] 10 (12/24 0730) BP: (99-172)/(54-105) 146/75 (12/24 0730) SpO2:  [87 %-100 %] 95 % (12/24 0730) FiO2 (%):  [50 %-60 %] 50 % (12/24 0805)  PHYSICAL EXAMINATION: Gen. Acutely ill appearing male, NAD on BiPAP but lethargic  HEENT: [place]/AT, PERRL, EOM-I and MMM, BiPAP in place and wound appears stable Lungs: Diminished diffusely Cardiovascular: RRR, Nl S1/S2 and -M/R/G Abdomen: Soft, NT, ND and +BS Musculoskeletal: No obvious deformity, no cyanosis or clubbing  Neuro:  AAOx4 following commands    I reviewed CXR myself, no acute disease noted  Discussed with PCCM-NP  Recent Labs  Lab 09/26/19 0622 09/27/19 0008 09/27/19 0122 09/28/19 0128  NA 140 140 140 142  K 3.8 3.5 3.6 3.6  CL 100  --  101 107  CO2 28  --  29 28  BUN 47*  --  49* 46*  CREATININE 1.02  --  1.13 0.96  GLUCOSE 158*  --  146* 144*   Recent Labs  Lab 09/25/19 0307 09/27/19 0008 09/27/19 0122 09/28/19 0128  HGB 13.2 14.3 14.4 14.2  HCT 39.5 42.0 41.2 40.6  WBC 14.6*  --  11.3* 13.7*  PLT 297  --  338 266   No results  found.  ASSESSMENT / PLAN:  Acute hypoxic respiratory failure- suspect multifactorial in setting post op swelling and hematoma s/p ACDF likely c/b mild volume overload. Hematoma and volume overload appear improved but increased oxygen demands and bipap needs continue. ??diaphragmatic injury v PE (no DVT on BLE dopplers).  P - Change BiPAP to when asleep given OSA history - HFNC to be started now - Monitor closely for hematoma obstructing airway - SLP when more awake and off precedex - Pulmonary hygiene as able  - Decadron discontinued by nsgy  - Low dose diureses as ordered - KVO IVF - HR control  - Start anti-coagulation, hold of for CTA to r/o PE for now  Hypertension/atrial fibrillation AFib with RVR  PLAN -  - Hold cardizem and lopressor given bradycardia - Low dose lasix ordered - Strict I/O - Heparin per pharmacy - PO anti-coagulation when hematoma is more stable  Leukocytosis -likely r/t steroids; afebrile P - Continue to trend WBC, fever curve. No utility for abx at present  Hold in the ICU given respiratory failure needs Precedex at night  The patient is critically ill with multiple organ systems failure and requires high complexity decision making for assessment and support, frequent evaluation and titration of therapies, application of advanced monitoring technologies and extensive interpretation of multiple databases.   Critical Care Time devoted to patient care services described in this note is  72  Minutes. This time reflects time of care of this signee Dr Jennet Maduro. This critical care time does not reflect procedure time, or teaching time or supervisory time of PA/NP/Med student/Med Resident etc but could involve care discussion time.  Rush Farmer, M.D. [facility] Pulmonary/Critical Care Medicine.

## 2019-09-28 NOTE — Progress Notes (Signed)
Updated E-Link MD on patient's heart rate and chest pain. New orders received, see [id number] note 09/28/2019 2004. Patient stated chest pain went away after metoprolol given and heart rate issues resolved. Will continue to monitor.

## 2019-09-28 NOTE — Progress Notes (Signed)
[facility] Progress Note Patient Name: [name] [name] DOB: 01-20-1951 MRN: [id number]   Date of Service  09/28/2019  HPI/Events of Note  Called for HR 167bpm with associated chest pain. Appearance of SVT on telemetry rather than ST. Was able to break briefly with vagal maneuvers to confirm SVT, but then recurred.  eICU Interventions  Treat with 5mg  IV metoprolol, then obtain EKG once rate is slower to eval for ischemic changes. Will need some oral metoprolol ordered thereafter to maintain him in SR. Aware of bradycardia with combined dilt + metoprolol earlier today and will use minimum effective dose.     Intervention Category Major Interventions: Arrhythmia - evaluation and management  Marily Lente Napoleon Monacelli 09/28/2019, 8:04 PM

## 2019-09-28 NOTE — Progress Notes (Addendum)
SLP Cancellation Note  Patient Details Name: [name] [name] MRN: [id number] DOB: 30-Oct-1950   Cancelled treatment:        Attempted to see pt for clinical swallowing evaluation x2.  Pt receiving bath, and following bath, required BiPAP.  Will let pt rest for respiratory recovery and reattempt later this date.  Addendum: Third attempt at 11:30.  Pt is still requiring BiPAP, not able to maintain O2 sats on [place].  Not appropriate for PO trials today.  Will reattempt as SLP schedule permits.   Celedonio Savage, MA, [place] Office: [phone]  09/28/2019, 9:54 AM

## 2019-09-28 NOTE — Progress Notes (Signed)
ANTICOAGULATION CONSULT NOTE - Follow Up Consult  Pharmacy Consult for heparin Indication: atrial fibrillation  Allergies  Allergen Reactions  . Vancomycin Anaphylaxis, Shortness Of Breath and Other (See Comments)    Was informed after heart surgery Breathing problem patient unsure  . Aldactone [Spironolactone]     GYNECOMASTIA   . Chlorthalidone Other (See Comments)    GOUT  . Nsaids Other (See Comments)    On blood thinner    Patient Measurements: Height: 5\' 6"  (167.6 cm) Weight: 241 lb (109.3 kg) IBW/kg (Calculated) : 63.8  Heparin Dosing Weight: 89 kg  Vital Signs: Temp: 97.2 F (36.2 C) (12/24 1600) Temp Source: Axillary (12/24 1600) BP: 132/72 (12/24 1700) Pulse Rate: 70 (12/24 1700)  Labs: Recent Labs    09/26/19 0622 09/26/19 2034 09/27/19 0008 09/27/19 0122 09/28/19 0128 09/28/19 1202 09/28/19 1923  HGB  --    < > 14.3 14.4 14.2  --   --   HCT  --   --  42.0 41.2 40.6  --   --   PLT  --   --   --  338 266  --   --   HEPARINUNFRC  --   --   --  1.64* 0.66 0.73* 0.40  CREATININE 1.02  --   --  1.13 0.96  --   --   TROPONINIHS  --   --   --  34*  --   --   --    < > = values in this interval not displayed.    Estimated Creatinine Clearance: 87.8 mL/min (by C-G formula based on SCr of 0.96 mg/dL).  Assessment: 68 yr old male presenting for neurosurgery - on apixaban pta for afib (last dose 09/17/19).  Pharmacy consulted to dose IV heparin.  Heparin level ~6 hrs after heparin infusion was reduced to 750 units/hr was 0.40 units/ml, which is within the goal range for this pt. CBC WNL. Per RN, no issues with IV or bleeding observed.  Goal of Therapy:  Heparin level 0.3-0.7 units/ml Monitor platelets by anticoagulation protocol: Yes   Plan:  Continue heparin infusion at 750 units/hr Check 6-hr heparin level Monitor daily heparin level, CBC Monitor for signs/symptoms of bleeding  Gillermina Hu, PharmD, BCPS, [facility] Clinical Pharmacist 09/28/2019,  8:07 PM

## 2019-09-28 NOTE — Progress Notes (Signed)
ANTICOAGULATION CONSULT NOTE - Follow Up Consult  Pharmacy Consult for heparin Indication: atrial fibrillation  Allergies  Allergen Reactions  . Vancomycin Anaphylaxis, Shortness Of Breath and Other (See Comments)    Was informed after heart surgery Breathing problem patient unsure  . Aldactone [Spironolactone]     GYNECOMASTIA   . Chlorthalidone Other (See Comments)    GOUT  . Nsaids Other (See Comments)    On blood thinner    Patient Measurements: Height: 5\' 6"  (167.6 cm) Weight: 241 lb (109.3 kg) IBW/kg (Calculated) : 63.8  Heparin Dosing Weight: 89 kg  Vital Signs: Temp: 96.9 F (36.1 C) (12/24 0800) Temp Source: Axillary (12/24 0800) BP: 160/85 (12/24 1200) Pulse Rate: 59 (12/24 1200)  Labs: Recent Labs    09/26/19 0622 09/26/19 2034 09/27/19 0008 09/27/19 0122 09/27/19 1023 09/28/19 0128  HGB  --    < > 14.3 14.4  --  14.2  HCT  --   --  42.0 41.2  --  40.6  PLT  --   --   --  338  --  266  HEPARINUNFRC  --   --   --  1.64* 1.06* 0.66  CREATININE 1.02  --   --  1.13  --  0.96  TROPONINIHS  --   --   --  34*  --   --    < > = values in this interval not displayed.    Estimated Creatinine Clearance: 87.8 mL/min (by C-G formula based on SCr of 0.96 mg/dL).  Assessment: 68 yr old male presenting for neurosurgery - on apixaban pta for afib (last dose 09/17/19).  Pharmacy consulted to dose IV heparin.  Heparin level this afternoon came back slightly supratherapeutic at 0.73, on 850 units/hr. Hgb 14.2, plt 266. No s/sx of bleeding.   Goal of Therapy:  Heparin level 0.3-0.7 units/ml Monitor platelets by anticoagulation protocol: Yes   Plan:  Reduce heparin infusion to 750 units/hr Daily heparin level and CBC, monitor for s/sx of bleeding  Thanks for allowing pharmacy to be a part of this patient's care.  Antonietta Jewel, PharmD, BCCCP Clinical Pharmacist  Phone: [phone]  Please check AMION for all [place] phone numbers After 10:00 PM, call [place] [phone] 09/28/2019, 1:09 PM

## 2019-09-28 NOTE — Progress Notes (Signed)
Subjective: Patient continues on BiPAP, O2 sat 98-99%.  Nods that he is comfortable.  Objective: Vital signs in last 24 hours: Vitals:   09/28/19 0600 09/28/19 0700 09/28/19 0730 09/28/19 0800  BP: 133/63 (!) 135/91 (!) 146/75   Pulse: (!) 46 (!) 50 (!) 56   Resp: 16 17 10    Temp:    (!) 96.9 F (36.1 C)  TempSrc:    Axillary  SpO2: 98% 97% 95%   Weight:      Height:        Intake/Output from previous day: 12/23 0701 - 12/24 0700 In: 472.6 [I.V.:472.6] Out: 1291 [Urine:1291] Intake/Output this shift: No intake/output data recorded.  Physical Exam: Awake and alert, following commands with all 4 extremities.  CBC Recent Labs    09/27/19 0122 09/28/19 0128  WBC 11.3* 13.7*  HGB 14.4 14.2  HCT 41.2 40.6  PLT 338 266   BMET Recent Labs    09/27/19 0122 09/28/19 0128  NA 140 142  K 3.6 3.6  CL 101 107  CO2 29 28  GLUCOSE 146* 144*  BUN 49* 46*  CREATININE 1.13 0.96  CALCIUM 9.1 8.9   ABG    Component Value Date/Time   PHART 7.474 (H) 09/27/2019 0008   PCO2ART 42.6 09/27/2019 0008   PO2ART 81.0 (L) 09/27/2019 0008   HCO3 31.4 (H) 09/27/2019 0008   TCO2 33 (H) 09/27/2019 0008   O2SAT 97.0 09/27/2019 0008    Assessment/Plan: Stable from neurosurgical perspective.  Appreciate assistance of critical care medicine.   Hosie Spangle, MD 09/28/2019, 10:04 AM

## 2019-09-28 NOTE — Progress Notes (Signed)
PT Cancellation Note  Patient Details Name: [name] [name] MRN: [id number] DOB: 1950/11/21   Cancelled Treatment:    Reason Eval/Treat Not Completed: Medical issues which prohibited therapy Pt just switched off BiPAP to nasal cannula and Sp02 still in 80s. Per RN, hold therapy today due to tenuous respiratory status. Will follow.   Marguarite Arbour A Peggi Yono 09/28/2019, 11:13 AM Marisa Severin, PT, DPT Acute Rehabilitation Services Pager [phone] Office [phone]

## 2019-09-28 NOTE — Progress Notes (Signed)
[place] Progress Note Patient Name: CHAS AXEL DOB: July 06, 1951 MRN: [id number]   Date of Service  09/28/2019  HPI/Events of Note  Follow up after administration of metoprolol 5mg  IV x1 dose for SVT.  Patient's HR is 69 bpm (sinus rhythm). He is sleeping comfortably on BiPAP satting 100%.  eICU Interventions  Will order metoprolol 6.25mg  PO Q6H to keep him in sinus rhythm while avoiding bradycardia which had complicated his hospitalization earlier on.     Intervention Category Intermediate Interventions: Arrhythmia - evaluation and management  Marily Lente Blia Totman 09/28/2019, 11:17 PM

## 2019-09-28 NOTE — Progress Notes (Signed)
ANTICOAGULATION CONSULT NOTE - Follow Up Consult  Pharmacy Consult for heparin Indication: atrial fibrillation  Allergies  Allergen Reactions  . Vancomycin Anaphylaxis, Shortness Of Breath and Other (See Comments)    Was informed after heart surgery Breathing problem patient unsure  . Aldactone [Spironolactone]     GYNECOMASTIA   . Chlorthalidone Other (See Comments)    GOUT  . Nsaids Other (See Comments)    On blood thinner    Patient Measurements: Height: 5\' 6"  (167.6 cm) Weight: 241 lb (109.3 kg) IBW/kg (Calculated) : 63.8  Heparin Dosing Weight: 89 kg  Vital Signs: Temp: 96.9 F (36.1 C) (12/24 0000) Temp Source: Axillary (12/24 0000) BP: 140/69 (12/24 0200) Pulse Rate: 46 (12/24 0200)  Labs: Recent Labs    09/26/19 0622 09/26/19 2034 09/27/19 0008 09/27/19 0122 09/27/19 1023 09/28/19 0128  HGB  --    < > 14.3 14.4  --  14.2  HCT  --   --  42.0 41.2  --  40.6  PLT  --   --   --  338  --  266  HEPARINUNFRC  --   --   --  1.64* 1.06* 0.66  CREATININE 1.02  --   --  1.13  --  0.96  TROPONINIHS  --   --   --  34*  --   --    < > = values in this interval not displayed.    Estimated Creatinine Clearance: 87.8 mL/min (by C-G formula based on SCr of 0.96 mg/dL).  Assessment: 68 yr old male presenting for neurosurgery - on apixaban pta for afib (last dose 09/17/19).  Pharmacy consulted to dose IV heparin.  Heparin level this morning 0.66 units/ml.  Hg 14.2, PTLC 266  Goal of Therapy:  Heparin level 0.3-0.7 units/ml Monitor platelets by anticoagulation protocol: Yes   Plan:  Continue heparin gtt at 850 units/hr Check 6 hr heparin level Daily heparin level and CBC  Thanks for allowing pharmacy to be a part of this patient's care.  Excell Seltzer, PharmD Clinical Pharmacist  09/28/2019, 3:08 AM

## 2019-09-29 ENCOUNTER — Inpatient Hospital Stay (HOSPITAL_COMMUNITY): Payer: Medicare Other

## 2019-09-29 DIAGNOSIS — J810 Acute pulmonary edema: Secondary | ICD-10-CM

## 2019-09-29 DIAGNOSIS — R451 Restlessness and agitation: Secondary | ICD-10-CM

## 2019-09-29 LAB — CBC
HCT: 43.6 % (ref 39.0–52.0)
Hemoglobin: 14.8 g/dL (ref 13.0–17.0)
MCH: 29.2 pg (ref 26.0–34.0)
MCHC: 33.9 g/dL (ref 30.0–36.0)
MCV: 86.2 fL (ref 80.0–100.0)
Platelets: 270 10*3/uL (ref 150–400)
RBC: 5.06 MIL/uL (ref 4.22–5.81)
RDW: 12.9 % (ref 11.5–15.5)
WBC: 15.1 10*3/uL — ABNORMAL HIGH (ref 4.0–10.5)
nRBC: 0 % (ref 0.0–0.2)

## 2019-09-29 LAB — BASIC METABOLIC PANEL
Anion gap: 12 (ref 5–15)
BUN: 37 mg/dL — ABNORMAL HIGH (ref 8–23)
CO2: 27 mmol/L (ref 22–32)
Calcium: 9 mg/dL (ref 8.9–10.3)
Chloride: 102 mmol/L (ref 98–111)
Creatinine, Ser: 1.07 mg/dL (ref 0.61–1.24)
GFR calc Af Amer: >60 mL/min (ref >60)
GFR calc non Af Amer: >60 mL/min (ref >60)
Glucose, Bld: 121 mg/dL — ABNORMAL HIGH (ref 70–99)
Potassium: 3.4 mmol/L — ABNORMAL LOW (ref 3.5–5.1)
Sodium: 141 mmol/L (ref 135–145)

## 2019-09-29 LAB — HEPARIN LEVEL (UNFRACTIONATED): Heparin Unfractionated: 0.29 IU/mL — ABNORMAL LOW (ref 0.30–0.70)

## 2019-09-29 LAB — MAGNESIUM: Magnesium: 2.4 mg/dL (ref 1.7–2.4)

## 2019-09-29 LAB — PHOSPHORUS: Phosphorus: 2.8 mg/dL (ref 2.5–4.6)

## 2019-09-29 MED ORDER — POTASSIUM CHLORIDE CRYS ER 20 MEQ PO TBCR
40.0000 meq | EXTENDED_RELEASE_TABLET | Freq: Three times a day (TID) | ORAL | Status: AC
  Administered 2019-09-29 (×2): 40 meq via ORAL
  Filled 2019-09-29 (×2): qty 2

## 2019-09-29 MED ORDER — CHLORHEXIDINE GLUCONATE 0.12 % MT SOLN
15.0000 mL | Freq: Two times a day (BID) | OROMUCOSAL | Status: DC
  Administered 2019-09-29 – 2019-10-03 (×7): 15 mL via OROMUCOSAL
  Filled 2019-09-29 (×6): qty 15

## 2019-09-29 MED ORDER — ORAL CARE MOUTH RINSE
15.0000 mL | Freq: Two times a day (BID) | OROMUCOSAL | Status: DC
  Administered 2019-09-29 – 2019-10-03 (×9): 15 mL via OROMUCOSAL

## 2019-09-29 MED ORDER — FUROSEMIDE 10 MG/ML IJ SOLN
20.0000 mg | Freq: Four times a day (QID) | INTRAMUSCULAR | Status: AC
  Administered 2019-09-29 (×3): 20 mg via INTRAVENOUS
  Filled 2019-09-29 (×3): qty 2

## 2019-09-29 NOTE — Progress Notes (Signed)
ANTICOAGULATION CONSULT NOTE - Follow Up Consult  Pharmacy Consult for heparin Indication: atrial fibrillation  Allergies  Allergen Reactions  . Vancomycin Anaphylaxis, Shortness Of Breath and Other (See Comments)    Was informed after heart surgery Breathing problem patient unsure  . Aldactone [Spironolactone]     GYNECOMASTIA   . Chlorthalidone Other (See Comments)    GOUT  . Nsaids Other (See Comments)    On blood thinner    Patient Measurements: Height: 5\' 6"  (167.6 cm) Weight: 241 lb (109.3 kg) IBW/kg (Calculated) : 63.8  Heparin Dosing Weight: 89 kg  Vital Signs: Temp: 98.4 F (36.9 C) (12/25 0400) Temp Source: Oral (12/25 0400) BP: 158/90 (12/25 0700) Pulse Rate: 58 (12/25 0700)  Labs: Recent Labs    09/27/19 0122 09/28/19 0128 09/28/19 1202 09/28/19 1923 09/29/19 0450  HGB 14.4 14.2  --   --  14.8  HCT 41.2 40.6  --   --  43.6  PLT 338 266  --   --  270  HEPARINUNFRC 1.64* 0.66 0.73* 0.40 0.29*  CREATININE 1.13 0.96  --   --  1.07  TROPONINIHS 34*  --   --   --   --     Estimated Creatinine Clearance: 78.8 mL/min (by C-G formula based on SCr of 1.07 mg/dL).  Assessment: 68 yr old male presenting for neurosurgery - on apixaban pta for afib (last dose 09/17/19).  Pharmacy consulted to dose IV heparin.  Heparin level just slightly low at 0.29 on 750 units/hr.  CBC is stable. No bleeding or issues with infusion noted.  Will increase slightly and follow level in AM.   Goal of Therapy:  Heparin level 0.3-0.7 units/ml Monitor platelets by anticoagulation protocol: Yes   Plan:  Increase Heparin to 800 units/hr Monitor daily heparin level, CBC Monitor for signs/symptoms of bleeding  Sloan Leiter, PharmD, BCPS, BCCCP Clinical Pharmacist Please refer to [facility] for [place] numbers 09/29/2019, 7:32 AM

## 2019-09-29 NOTE — Progress Notes (Signed)
Subjective: Patient sitting up in bed with nasal cannula O2.  Apparently is now using BiPAP only at night.  Has been treated for SVT by critical care.  Objective: Vital signs in last 24 hours: Vitals:   09/29/19 0400 09/29/19 0500 09/29/19 0600 09/29/19 0700  BP: 134/76 124/78 131/75 (!) 158/90  Pulse: (!) 57 (!) 59 (!) 57 (!) 58  Resp: 19 (!) 21 (!) 23 16  Temp: 98.4 F (36.9 C)     TempSrc: Oral     SpO2: 100% 100% 99% 100%  Weight:      Height:        Intake/Output from previous day: 12/24 0701 - 12/25 0700 In: 580 [P.O.:240; I.V.:340] Out: 1835 [Urine:1835] Intake/Output this shift: Total I/O In: 15.9 [I.V.:15.9] Out: -   Physical Exam: Awake alert, following commands.  Moving all 4 extremities.  Wound clean and dry, healing nicely.  CBC Recent Labs    09/28/19 0128 09/29/19 0450  WBC 13.7* 15.1*  HGB 14.2 14.8  HCT 40.6 43.6  PLT 266 270   BMET Recent Labs    09/28/19 0128 09/29/19 0450  NA 142 141  K 3.6 3.4*  CL 107 102  CO2 28 27  GLUCOSE 144* 121*  BUN 46* 37*  CREATININE 0.96 1.07  CALCIUM 8.9 9.0    Assessment/Plan: Continuing supportive care.  PT held yesterday, but certainly can be resumed, and clearly patient will benefit from PT.  We will add OT as well.  Hosie Spangle, MD 09/29/2019, 8:33 AM

## 2019-09-29 NOTE — Progress Notes (Signed)
Patient ID: [name] [name], male   DOB: 07/04/1951, 68 y.o.   MRN: [id number] BP (!) 159/81   Pulse 85   Temp 98.4 F (36.9 C) (Oral)   Resp (!) 27   Ht 5\' 6"  (1.676 m)   Wt 109.3 kg   SpO2 100%   BMI 38.90 kg/m  Alert and oriented, breathing is slightly labored with [place] Moving extremities Wound dressing intact Continue current treatment

## 2019-09-29 NOTE — Evaluation (Signed)
Clinical/Bedside Swallow Evaluation Patient Details  Name: [name] [name] MRN: [id number] Date of Birth: 1951/03/11  Today's Date: 09/29/2019 Time: SLP Start Time (ACUTE ONLY): 9798 SLP Stop Time (ACUTE ONLY): 0845 SLP Time Calculation (min) (ACUTE ONLY): 10 min  Past Medical History:  Past Medical History:  Diagnosis Date  . Anxiety   . CHF (congestive heart failure) (HCC)    h/o due to fluid overload in recovery room  . Coronary artery disease    2v CABG, 2004 [facility])  . COVID-19   . DJD (degenerative joint disease)   . Dyspnea    on exertion  . GERD (gastroesophageal reflux disease)   . History of kidney stones    3 stones yrs. ago  . HLD (hyperlipidemia)   . Hypertension   . MI, old 2004  . OA (osteoarthritis)   . Obesity   . Persistent atrial fibrillation (HCC)    failed medical therapy with sotalol, s/p PVI x 2  . Sleep apnea    on CPAP  . Spinal stenosis   . Typical atrial flutter [facility])    Past Surgical History:  Past Surgical History:  Procedure Laterality Date  . ANTERIOR CERVICAL DECOMP/DISCECTOMY FUSION [name] 09/21/2019   Procedure: ANTERIOR CERVICAL DECOMPRESSION/DISCECTOMY FUSION CERVICAL THREE- CERVICAL FOUR, CERVICAL FOUR- CERVICAL FIVE, CERVICAL FIVE- CERVICAL SIX;  Surgeon: Tressie Stalker, MD;  Location: [facility] OR;  Service: Neurosurgery;  Laterality: [name];  ANTERIOR CERVICAL DECOMPRESSION/DISCECTOMY FUSION CERVICAL THREE- CERVICAL FOUR, CERVICAL FOUR- CERVICAL FIVE, CERVICAL FIVE- CERVICAL SIX  . ATRIAL FIBRILLATION ABLATION [name] 05/24/2018   Procedure: ATRIAL FIBRILLATION ABLATION;  Surgeon: Hillis Range, MD;  Location: [facility] INVASIVE CV LAB;  Service: Cardiovascular;  Laterality: [name];  . BACK SURGERY  2011  . CARPAL TUNNEL RELEASE     bilateral  . CHOLECYSTECTOMY    . CORONARY ARTERY BYPASS GRAFT     2004  . ELECTROPHYSIOLOGIC STUDY [name] 09/15/2016   Procedure: Atrial Fibrillation Ablation;  Surgeon: Hillis Range, MD;  Location: [facility] INVASIVE CV LAB;  Service:  Cardiovascular;  Laterality: [name];  . fistula surgery    . hemorrhoidectomy    . LEFT HEART CATH AND CORS/GRAFTS ANGIOGRAPHY [name] 02/07/2018   Procedure: LEFT HEART CATH AND CORS/GRAFTS ANGIOGRAPHY;  Surgeon: [name], Peter M, MD;  Location: [facility] INVASIVE CV LAB;  Service: Cardiovascular;  Laterality: [name];  . LUMBAR LAMINECTOMY    . right foot fracture    . TEE WITHOUT CARDIOVERSION [name] 09/15/2016   Procedure: TRANSESOPHAGEAL ECHOCARDIOGRAM (TEE);  Surgeon: Lewayne Bunting, MD;  Location: [facility] ENDOSCOPY;  Service: Cardiovascular;  Laterality: [name];  . TOTAL KNEE ARTHROPLASTY Left 07/07/2017   Procedure: LEFT TOTAL KNEE ARTHROPLASTY;  Surgeon: Ranee Gosselin, MD;  Location: [facility] ORS;  Service: Orthopedics;  Laterality: Left;  . TRANSTHORACIC ECHOCARDIOGRAM  08/25/2010   EF 55-60%   HPI:  68 year old man with OSA, hypertension, atrial fibrillation underwent multilevel C3-6 ACDF on 12/17 and developed acute respiratory distress 12/18 requiring transfer to ICU and BiPAP to maintain 02 sats. Dx acute hypoxic resp failurein setting post op swelling and hematoma s/p ACDF.   Assessment / Plan / Recommendation Clinical Impression  Pt presents with functional swallow given limited assessment restricted to clear liquids. Demonstrated strong voice/cough.  No focal CN deficits. There were no overt s/s of aspiration.  Would not anticipate sensory deficits.  Any residual dysphagia would likely be related to pre-vertebral swelling; however, pt appears to be managing well.  Recommend PO intake with HOB as high as able; crush meds in  case there remains some edema in pharynx.  Defer diet to neurosurgery - no SLP needs; will sign off.  SLP Visit Diagnosis: Dysphagia, unspecified (R13.10)    Aspiration Risk  Mild aspiration risk    Diet Recommendation   clear liquids - advance per neurosurgery  Medication Administration: Crushed with puree    Other  Recommendations Oral Care Recommendations: Oral care BID   Follow up  Recommendations   n/a     Frequency and Duration            Prognosis        Swallow Study   General Date of Onset: 09/21/19 HPI: 68 year old man with OSA, hypertension, atrial fibrillation underwent multilevel ACDF on 12/17 and developed acute respiratory distress 12/18 requiring transfer to ICU and BiPAP to maintain 02 sats. Dx acute hypoxic resp failurein setting post op swelling and hematoma s/p ACDF. Type of Study: Bedside Swallow Evaluation Diet Prior to this Study: Thin liquids Temperature Spikes Noted: No Respiratory Status: Nasal cannula History of Recent Intubation: No Behavior/Cognition: Alert;Cooperative Oral Cavity Assessment: Within Functional Limits Oral Care Completed by SLP: No Oral Cavity - Dentition: Adequate natural dentition Vision: Functional for self-feeding Self-Feeding Abilities: Needs assist Patient Positioning: Upright in bed Baseline Vocal Quality: Normal Volitional Cough: Strong Volitional Swallow: Able to elicit    Oral/Motor/Sensory Function Overall Oral Motor/Sensory Function: Within functional limits   Ice Chips Ice chips: Within functional limits   Thin Liquid Thin Liquid: Within functional limits    Nectar Thick Nectar Thick Liquid: Not tested   Honey Thick Honey Thick Liquid: Not tested   Puree Puree: Not tested   Solid     Solid: Not tested      [name] [name] 09/29/2019,8:54 AM   Estill Bamberg L. Tivis Ringer, [place] Office number [phone] Pager [phone]

## 2019-09-29 NOTE — Plan of Care (Signed)
  Problem: Clinical Measurements: Goal: Respiratory complications will improve Outcome: Progressing   Problem: Nutrition: Goal: Adequate nutrition will be maintained Outcome: Progressing   Problem: Coping: Goal: Level of anxiety will decrease Outcome: Progressing   Problem: Skin Integrity: Goal: Risk for impaired skin integrity will decrease Outcome: Progressing   Problem: Bowel/Gastric: Goal: Gastrointestinal status for postoperative course will improve Outcome: Progressing   Problem: Pain Management: Goal: Pain level will decrease Outcome: Progressing   Problem: Skin Integrity: Goal: Will show signs of wound healing Outcome: Progressing

## 2019-09-29 NOTE — Progress Notes (Signed)
Name: ALEXA GOLEBIEWSKI MRN: [id number] DOB: 1951/08/01    ADMISSION DATE:  09/21/2019 CONSULTATION DATE:  09/29/2019  REFERRING MD :  Quincy Carnes  CHIEF COMPLAINT: Respiratory distress  BRIEF PATIENT DESCRIPTION: 68 year old man with OSA, hypertension, atrial fibrillation underwent multilevel ACDF on 12/17 and developed acute respiratory distress 12/18 requiring transfer to ICU  SIGNIFICANT EVENTS  12/19 transferred to ICU for respiratory distress  12/21 tolerating BiPAP more so than HFNC   STUDIES:  CXR 12/21>Low lung volumes. Cardiomegaly.  atelectasis, elevated R hemidiaphragm.  12/21 BLE Venous doppler>> neg for DVT   SUBJECTIVE:  Remains on BiPAP but reports feeling better  VITAL SIGNS: Temp:  [96.5 F (35.8 C)-98.8 F (37.1 C)] 98.4 F (36.9 C) (12/25 0400) Pulse Rate:  [56-165] 76 (12/25 0800) Resp:  [16-32] 21 (12/25 0800) BP: (107-160)/(57-112) 157/74 (12/25 0800) SpO2:  [95 %-100 %] 100 % (12/25 0800)  PHYSICAL EXAMINATION: Gen. Acutely ill appearing, mild respiratory distress HEENT: [place]/AT, PERRL, EOM-I and MMM, BiPAP Lungs: Diffusely diminished Cardiovascular: RRR, Nl S1/S2 and -M/R/G Abdomen: Soft, NT, ND and +BS Musculoskeletal: No obvious deformity, no cyanosis or clubbing  Neuro:  AAOx4 following commands    I reviewed CXR myself, no acute disease noted  Discussed with PCCM-NP  Recent Labs  Lab 09/27/19 0122 09/28/19 0128 09/29/19 0450  NA 140 142 141  K 3.6 3.6 3.4*  CL 101 107 102  CO2 29 28 27   BUN 49* 46* 37*  CREATININE 1.13 0.96 1.07  GLUCOSE 146* 144* 121*   Recent Labs  Lab 09/27/19 0122 09/28/19 0128 09/29/19 0450  HGB 14.4 14.2 14.8  HCT 41.2 40.6 43.6  WBC 11.3* 13.7* 15.1*  PLT 338 266 270   DG Chest Port 1 View  Result Date: 09/29/2019 CLINICAL DATA:  Acute respiratory failure/hypoxia. EXAM: PORTABLE CHEST 1 VIEW COMPARISON:  09/26/2019 FINDINGS: Lungs are hypoinflated demonstrate slight worsening bibasilar  opacification which may be due to effusions/atelectasis. Mild stable cardiomegaly. Remainder of the exam is unchanged. IMPRESSION: Hypoinflation with slight worsening bibasilar opacification likely representing effusions with atelectasis. Electronically Signed   By: 09/28/2019 M.D.   On: 09/29/2019 08:44    ASSESSMENT / PLAN:  Acute hypoxic respiratory failure- suspect multifactorial in setting post op swelling and hematoma s/p ACDF likely c/b mild volume overload. Hematoma and volume overload appear improved but increased oxygen demands and bipap needs continue. ??diaphragmatic injury v PE (no DVT on BLE dopplers).   Concern here is that patient gets so agitated and puts himself in negative pressure pulmonary edema P - BiPAP to PRN and when asleep - HFNC and titrate for sat of 88-92% - Monitor closely for hematoma obstructing airway - SLP - Attempt off precedex - Pulmonary hygiene as able  - Decadron discontinued by nsgy  - Low dose diureses as ordered - KVO IVF - HR control  - Start anti-coagulation, hold of for CTA to r/o PE for now  Hypertension/atrial fibrillation AFib with RVR  PLAN -  - Hold cardizem and lopressor given bradycardia - Lasix 20 mg IV q6 x3 doses - Replace K - Strict I/O - Heparin per pharmacy - PO anti-coagulation when hematoma is more stable  Leukocytosis -likely r/t steroids; afebrile P - Continue to trend WBC, fever curve. No utility for abx at present  Hold in the ICU given respiratory failure needs Precedex at night to PRN  The patient is critically ill with multiple organ systems failure and requires high complexity decision making for assessment  and support, frequent evaluation and titration of therapies, application of advanced monitoring technologies and extensive interpretation of multiple databases.   Critical Care Time devoted to patient care services described in this note is  31  Minutes. This time reflects time of care of this signee Dr  Jennet Maduro. This critical care time does not reflect procedure time, or teaching time or supervisory time of PA/NP/Med student/Med Resident etc but could involve care discussion time.  Rush Farmer, M.D. [facility] Pulmonary/Critical Care Medicine.

## 2019-09-30 ENCOUNTER — Inpatient Hospital Stay (HOSPITAL_COMMUNITY): Payer: Medicare Other

## 2019-09-30 DIAGNOSIS — R5381 Other malaise: Secondary | ICD-10-CM

## 2019-09-30 DIAGNOSIS — J9811 Atelectasis: Secondary | ICD-10-CM

## 2019-09-30 LAB — PHOSPHORUS: Phosphorus: 3.1 mg/dL (ref 2.5–4.6)

## 2019-09-30 LAB — BASIC METABOLIC PANEL
Anion gap: 11 (ref 5–15)
BUN: 26 mg/dL — ABNORMAL HIGH (ref 8–23)
CO2: 25 mmol/L (ref 22–32)
Calcium: 8.8 mg/dL — ABNORMAL LOW (ref 8.9–10.3)
Chloride: 104 mmol/L (ref 98–111)
Creatinine, Ser: 1.21 mg/dL (ref 0.61–1.24)
GFR calc Af Amer: >60 mL/min (ref >60)
GFR calc non Af Amer: >60 mL/min (ref >60)
Glucose, Bld: 120 mg/dL — ABNORMAL HIGH (ref 70–99)
Potassium: 3.5 mmol/L (ref 3.5–5.1)
Sodium: 140 mmol/L (ref 135–145)

## 2019-09-30 LAB — PROCALCITONIN: Procalcitonin: <0.1 ng/mL

## 2019-09-30 LAB — CBC
HCT: 45 % (ref 39.0–52.0)
Hemoglobin: 15.4 g/dL (ref 13.0–17.0)
MCH: 28.9 pg (ref 26.0–34.0)
MCHC: 34.2 g/dL (ref 30.0–36.0)
MCV: 84.6 fL (ref 80.0–100.0)
Platelets: 290 10*3/uL (ref 150–400)
RBC: 5.32 MIL/uL (ref 4.22–5.81)
RDW: 12.7 % (ref 11.5–15.5)
WBC: 18 10*3/uL — ABNORMAL HIGH (ref 4.0–10.5)
nRBC: 0 % (ref 0.0–0.2)

## 2019-09-30 LAB — MAGNESIUM: Magnesium: 2.3 mg/dL (ref 1.7–2.4)

## 2019-09-30 LAB — HEPARIN LEVEL (UNFRACTIONATED): Heparin Unfractionated: 0.38 IU/mL (ref 0.30–0.70)

## 2019-09-30 MED ORDER — POTASSIUM CHLORIDE CRYS ER 20 MEQ PO TBCR
40.0000 meq | EXTENDED_RELEASE_TABLET | Freq: Three times a day (TID) | ORAL | Status: AC
  Administered 2019-09-30 (×2): 40 meq via ORAL
  Filled 2019-09-30 (×2): qty 2

## 2019-09-30 MED ORDER — METOLAZONE 5 MG PO TABS
10.0000 mg | ORAL_TABLET | Freq: Once | ORAL | Status: AC
  Administered 2019-09-30: 10 mg via ORAL
  Filled 2019-09-30: qty 2
  Filled 2019-09-30: qty 1

## 2019-09-30 MED ORDER — FUROSEMIDE 10 MG/ML IJ SOLN
40.0000 mg | Freq: Four times a day (QID) | INTRAMUSCULAR | Status: AC
  Administered 2019-09-30 – 2019-10-01 (×3): 40 mg via INTRAVENOUS
  Filled 2019-09-30 (×4): qty 4

## 2019-09-30 NOTE — Progress Notes (Signed)
Name: [name] [name] MRN: [id number] DOB: [birth year]    ADMISSION DATE:  09/21/2019 CONSULTATION DATE:  09/30/2019  REFERRING MD :  Quincy Carnes  CHIEF COMPLAINT: Respiratory distress  BRIEF PATIENT DESCRIPTION: 68 year old man with OSA, hypertension, atrial fibrillation underwent multilevel ACDF on 12/17 and developed acute respiratory distress 12/18 requiring transfer to ICU  SIGNIFICANT EVENTS  12/19 transferred to ICU for respiratory distress  12/21 tolerating BiPAP more so than HFNC   STUDIES:  CXR 12/21>Low lung volumes. Cardiomegaly.  atelectasis, elevated R hemidiaphragm.  12/21 BLE Venous doppler>> neg for DVT   SUBJECTIVE:  Increased WOB on HFNC, did not wear BiPAP overnight  VITAL SIGNS: Temp:  [98 F (36.7 C)-98.5 F (36.9 C)] 98.3 F (36.8 C) (12/26 0800) Pulse Rate:  [45-103] 93 (12/26 1154) Resp:  [13-29] 21 (12/26 1154) BP: (132-171)/(60-95) 155/95 (12/26 1000) SpO2:  [96 %-100 %] 96 % (12/26 1154)  PHYSICAL EXAMINATION: Gen. Acutely ill appearing, in respiratory distress this AM but not desaturating HEENT: [place]/AT, PERRL, EOM-I and MMM, on HFNC, neck wound with hematoma noted Lungs: Bibasilar crackles Cardiovascular: RRR, Nl S1/S2 and -M/R/G Abdomen: Soft, NT, ND and +BS Musculoskeletal: No obvious deformity, no cyanosis or clubbing  Neuro:  AAOx4 following commands    I reviewed CXR myself, bibasilar atelectasis with very low lung volumes  Discussed with RT  Recent Labs  Lab 09/28/19 0128 09/29/19 0450 09/30/19 0550  NA 142 141 140  K 3.6 3.4* 3.5  CL 107 102 104  CO2 28 27 25   BUN 46* 37* 26*  CREATININE 0.96 1.07 1.21  GLUCOSE 144* 121* 120*   Recent Labs  Lab 09/28/19 0128 09/29/19 0450 09/30/19 0550  HGB 14.2 14.8 15.4  HCT 40.6 43.6 45.0  WBC 13.7* 15.1* 18.0*  PLT 266 270 290   DG Neck Soft Tissue  Result Date: 09/30/2019 CLINICAL DATA:  Shortness of breath and neck swelling. EXAM: NECK SOFT TISSUES - 1+ VIEW COMPARISON:   09/23/2019 FINDINGS: Nasopharynx, oropharynx, pharynx and hypopharyngeal region are normal. Prevertebral soft tissues are normal. Epiglottis and subglottic airway are normal. Anterior fusion hardware intact unchanged over the cervical spine remainder the exam is unchanged. IMPRESSION: No acute findings. Electronically Signed   By: 09/25/2019 M.D.   On: 09/30/2019 10:44   DG Chest Port 1 View  Result Date: 09/30/2019 CLINICAL DATA:  Acute respiratory failure with hypoxemia. EXAM: PORTABLE CHEST 1 VIEW COMPARISON:  09/29/2019 FINDINGS: Sternotomy wires unchanged. Lungs are hypoinflated with stable bibasilar opacification likely small effusions with associated basilar atelectasis. Cardiomediastinal silhouette and remainder of the exam is unchanged. IMPRESSION: Stable bibasilar opacification likely effusions with atelectasis. Electronically Signed   By: 10/01/2019 M.D.   On: 09/30/2019 09:20   DG Chest Port 1 View  Result Date: 09/29/2019 CLINICAL DATA:  Acute respiratory failure/hypoxia. EXAM: PORTABLE CHEST 1 VIEW COMPARISON:  09/26/2019 FINDINGS: Lungs are hypoinflated demonstrate slight worsening bibasilar opacification which may be due to effusions/atelectasis. Mild stable cardiomegaly. Remainder of the exam is unchanged. IMPRESSION: Hypoinflation with slight worsening bibasilar opacification likely representing effusions with atelectasis. Electronically Signed   By: 09/28/2019 M.D.   On: 09/29/2019 08:44    ASSESSMENT / PLAN:  Acute hypoxic respiratory failure- suspect multifactorial in setting post op swelling and hematoma s/p ACDF likely c/b mild volume overload. Hematoma and volume overload appear improved but increased oxygen demands and bipap needs continue. ??diaphragmatic injury v PE (no DVT on BLE dopplers).   Concern here is that patient  gets so agitated and puts himself in negative pressure pulmonary edema P - BiPAP to PRN but mandatory at night - HFNC and titrate for sat of  88-92% - Monitor closely for hematoma obstructing airway - SLP - Attempt off precedex - Pulmonary hygiene as able  - IS and flutter valve - Check PCT, if high will start abx - Ambulate - OOB to chair - Decadron discontinued by nsgy  - Low dose diureses as ordered - KVO IVF - HR control  - Start anti-coagulation, hold of for CTA to r/o PE for now  Hypertension/atrial fibrillation AFib with RVR  PLAN -  - Hold cardizem and lopressor given bradycardia - Lasix 20 mg IV q6 x3 doses - Replace K - Strict I/O - Heparin per pharmacy - PO anti-coagulation when hematoma is more stable  Leukocytosis -likely r/t steroids; afebrile P - Continue to trend WBC, fever curve. No utility for abx at present  Hold in the ICU given respiratory failure needs Precedex at night to PRN  The patient is critically ill with multiple organ systems failure and requires high complexity decision making for assessment and support, frequent evaluation and titration of therapies, application of advanced monitoring technologies and extensive interpretation of multiple databases.   Critical Care Time devoted to patient care services described in this note is  32  Minutes. This time reflects time of care of this signee Dr Jennet Maduro. This critical care time does not reflect procedure time, or teaching time or supervisory time of PA/NP/Med student/Med Resident etc but could involve care discussion time.  Rush Farmer, M.D. [facility] Pulmonary/Critical Care Medicine.

## 2019-09-30 NOTE — Progress Notes (Signed)
Physical Therapy Treatment Patient Details Name: [name] [name] MRN: [id number] DOB: 08/26/1951 Today's Date: 09/30/2019    History of Present Illness Pt is a 68 yo male s/p ACDF C3-4, C4-5, C5-6 and plating.  12/18 developed respiratory distress and transferred to ICU.  Pt PMHx: CHF, MI, HTN, DJD, CAD,back sx, L TKA.    PT Comments    Pt was able to tolerate OOB today for first time in 6 days. Pt on BiPAP with noted bilat LE weakness and decreased activity tolerance. Pt requiring modAx2 however eager to get up and get OOB. RR into 40s, pt requiring 3 rest breaks during std pvt to chair. Acute PT to cont to progress OOB mobility and increase activity tolerance to be able to participate in CIR upon d/c.    Follow Up Recommendations  CIR     Equipment Recommendations  (TBD)    Recommendations for Other Services Rehab consult     Precautions / Restrictions Precautions Precautions: Cervical(pt on BiPAP) Precaution Booklet Issued: Yes (comment) Precaution Comments: verbal discussion of handout and precautions Restrictions Weight Bearing Restrictions: No    Mobility  Bed Mobility Overal bed mobility: Needs Assistance Bed Mobility: Rolling;Sidelying to Sit Rolling: Mod assist Sidelying to sit: Mod assist       General bed mobility comments: pt initiated rolling, verbal cues to maintain log roll and to push up with L UE/elbow, modA for trunk elevation, pt with increased RR due to exertion, RN present  Transfers Overall transfer level: Needs assistance Equipment used: 2 person hand held assist Transfers: Sit to/from [facility] Sit to Stand: Min assist;+2 physical assistance Stand pivot transfers: Min assist;+2 physical assistance       General transfer comment: pt took 3 trials of sit to stand to complete std pvt to chair, pt with labored effort, bilate LE weakness and more difficulty moving the L LE than the R., pt became anxious due to increased DOE and  impulsively sat back on the bed, max encouragement required to keep stepping to get to chair  Ambulation/Gait             General Gait Details: unable this date   Stairs             Wheelchair Mobility    Modified Rankin (Stroke Patients Only)       Balance Overall balance assessment: Needs assistance Sitting-balance support: Single extremity supported;No upper extremity supported Sitting balance-Leahy Scale: Fair     Standing balance support: Bilateral upper extremity supported Standing balance-Leahy Scale: Poor Standing balance comment: dependent on physical assist                            Cognition Arousal/Alertness: Awake/alert Behavior During Therapy: WFL for tasks assessed/performed Overall Cognitive Status: Within Functional Limits for tasks assessed                                 General Comments: per RN pt very anxious at baseline however pt eager to get OOB and worked well with PT today      Exercises General Exercises - Lower Extremity Ankle Circles/Pumps: AROM;10 reps;Both Gluteal Sets: AROM;Both;10 reps;Seated Long Arc Quad: AROM;Both;10 reps;Seated Hip Flexion/Marching: Both;10 reps;Seated    General Comments General comments (skin integrity, edema, etc.): pt with healing ACDF incision, pt on BiPAP      Pertinent Vitals/Pain Pain Assessment: No/denies pain  Home Living                      Prior Function            PT Goals (current goals can now be found in the care plan section) Progress towards PT goals: Progressing toward goals    Frequency    Min 5X/week      PT Plan Current plan remains appropriate    Co-evaluation              AM-PAC PT "6 Clicks" Mobility   Outcome Measure  Help needed turning from your back to your side while in a flat bed without using bedrails?: A Lot Help needed moving from lying on your back to sitting on the side of a flat bed without using  bedrails?: A Lot Help needed moving to and from a bed to a chair (including a wheelchair)?: A Lot Help needed standing up from a chair using your arms (e.g., wheelchair or bedside chair)?: A Lot Help needed to walk in hospital room?: A Lot Help needed climbing 3-5 steps with a railing? : Total 6 Click Score: 11    End of Session   Activity Tolerance: Patient tolerated treatment well Patient left: in chair;with chair alarm set Nurse Communication: Mobility status PT Visit Diagnosis: Other abnormalities of gait and mobility (R26.89);Pain;Muscle weakness (generalized) (M62.81);Unsteadiness on feet (R26.81)     Time: 6295-2841 PT Time Calculation (min) (ACUTE ONLY): 16 min  Charges:  $Therapeutic Activity: 8-22 mins                     [name] [name], PT, DPT Acute Rehabilitation Services Pager #: [phone] Office #: [phone]    [name] [name] 09/30/2019, 3:42 PM

## 2019-09-30 NOTE — Progress Notes (Signed)
Subjective: The patient is alert and pleasant.  He is short of breath.  Objective: Vital signs in last 24 hours: Temp:  [98 F (36.7 C)-98.5 F (36.9 C)] 98.3 F (36.8 C) (12/26 0400) Pulse Rate:  [45-103] 90 (12/26 0900) Resp:  [13-29] 22 (12/26 0900) BP: (132-171)/(60-83) 152/75 (12/26 0900) SpO2:  [99 %-100 %] 100 % (12/26 0900) Estimated body mass index is 38.9 kg/m as calculated from the following:   Height as of this encounter: 5\' 6"  (1.676 m).   Weight as of this encounter: 109.3 kg.   Intake/Output from previous day: 12/25 0701 - 12/26 0700 In: 646.7 [P.O.:400; I.V.:121.7] Out: 2475 [Urine:2475] Intake/Output this shift: No intake/output data recorded.  Physical exam the patient is alert and oriented.  He is moving all 4 extremities.  The patient's cervical wound is healing well.  His neck is quite large at baseline.  There is a palpable swelling without shift.  He is not stridorous.  Lab Results: Recent Labs    09/29/19 0450 09/30/19 0550  WBC 15.1* 18.0*  HGB 14.8 15.4  HCT 43.6 45.0  PLT 270 290   BMET Recent Labs    09/29/19 0450 09/30/19 0550  NA 141 140  K 3.4* 3.5  CL 102 104  CO2 27 25  GLUCOSE 121* 120*  BUN 37* 26*  CREATININE 1.07 1.21  CALCIUM 9.0 8.8*    Studies/Results: DG Chest Port 1 View  Result Date: 09/30/2019 CLINICAL DATA:  Acute respiratory failure with hypoxemia. EXAM: PORTABLE CHEST 1 VIEW COMPARISON:  09/29/2019 FINDINGS: Sternotomy wires unchanged. Lungs are hypoinflated with stable bibasilar opacification likely small effusions with associated basilar atelectasis. Cardiomediastinal silhouette and remainder of the exam is unchanged. IMPRESSION: Stable bibasilar opacification likely effusions with atelectasis. Electronically Signed   By: Marin Olp M.D.   On: 09/30/2019 09:20   DG Chest Port 1 View  Result Date: 09/29/2019 CLINICAL DATA:  Acute respiratory failure/hypoxia. EXAM: PORTABLE CHEST 1 VIEW COMPARISON:   09/26/2019 FINDINGS: Lungs are hypoinflated demonstrate slight worsening bibasilar opacification which may be due to effusions/atelectasis. Mild stable cardiomegaly. Remainder of the exam is unchanged. IMPRESSION: Hypoinflation with slight worsening bibasilar opacification likely representing effusions with atelectasis. Electronically Signed   By: Marin Olp M.D.   On: 09/29/2019 08:44    Assessment/Plan: Postop day #9: The patient has a known prevertebral postoperative hematoma.  I would have thought by now this would have largely resolved, but he is on heparin.  He does not seem stridorous so I do not think this is the problem.  I will check a lateral C-spine x-ray.  Shortness of breath: As above, I will check a lateral C-spine x-ray.     : LOS: 9 days     Ophelia Charter 09/30/2019, 9:25 AM

## 2019-09-30 NOTE — Progress Notes (Signed)
I have reviewed the patient's cervical AP and lateral x-ray performed this morning.  It demonstrates good position of the plate, screws and prosthesis from C3-C6.  He has mild prevertebral soft tissue swelling, but much better when compared with his prior x-ray of 09/23/2019.  There is no significant midline shift.

## 2019-09-30 NOTE — Progress Notes (Signed)
ANTICOAGULATION CONSULT NOTE - Follow Up Consult  Pharmacy Consult for heparin Indication: atrial fibrillation  Allergies  Allergen Reactions  . Vancomycin Anaphylaxis, Shortness Of Breath and Other (See Comments)    Was informed after heart surgery Breathing problem patient unsure  . Aldactone [Spironolactone]     GYNECOMASTIA   . Chlorthalidone Other (See Comments)    GOUT  . Nsaids Other (See Comments)    On blood thinner    Patient Measurements: Height: 5\' 6"  (167.6 cm) Weight: 241 lb (109.3 kg) IBW/kg (Calculated) : 63.8  Heparin Dosing Weight: 89 kg  Vital Signs: Temp: 98.3 F (36.8 C) (12/26 0400) Temp Source: Oral (12/26 0400) BP: 161/83 (12/26 0600) Pulse Rate: 45 (12/26 0600)  Labs: Recent Labs    09/28/19 0128 09/28/19 1923 09/29/19 0450 09/30/19 0550  HGB 14.2  --  14.8 15.4  HCT 40.6  --  43.6 45.0  PLT 266  --  270 290  HEPARINUNFRC 0.66 0.40 0.29* 0.38  CREATININE 0.96  --  1.07 1.21    Estimated Creatinine Clearance: 69.7 mL/min (by C-G formula based on SCr of 1.21 mg/dL).  Assessment: 68 yr old male presenting for neurosurgery - on apixaban pta for afib (last dose 09/17/19).   Pharmacy consulted to dose IV heparin while apixaban is on hold.  Heparin level is therapeutic this AM at 0.38 on 800 units/hr.  CBC is stable.  Prevertebral hematoma improving.  No overt bleeding or issues with infusion noted.   Goal of Therapy:  Heparin level 0.3-0.7 units/ml Monitor platelets by anticoagulation protocol: Yes   Plan:  Continue Heparin to 800 units/hr Monitor daily heparin level, CBC Monitor for signs/symptoms of bleeding  Sloan Leiter, PharmD, BCPS, BCCCP Clinical Pharmacist Please refer to [facility] for [place] numbers 09/30/2019, 7:23 AM

## 2019-10-01 LAB — BASIC METABOLIC PANEL
Anion gap: 14 (ref 5–15)
BUN: 27 mg/dL — ABNORMAL HIGH (ref 8–23)
CO2: 25 mmol/L (ref 22–32)
Calcium: 9.5 mg/dL (ref 8.9–10.3)
Chloride: 98 mmol/L (ref 98–111)
Creatinine, Ser: 1.33 mg/dL — ABNORMAL HIGH (ref 0.61–1.24)
GFR calc Af Amer: >60 mL/min (ref >60)
GFR calc non Af Amer: 55 mL/min — ABNORMAL LOW (ref >60)
Glucose, Bld: 137 mg/dL — ABNORMAL HIGH (ref 70–99)
Potassium: 4.5 mmol/L (ref 3.5–5.1)
Sodium: 137 mmol/L (ref 135–145)

## 2019-10-01 LAB — CBC
HCT: 47.4 % (ref 39.0–52.0)
Hemoglobin: 16.6 g/dL (ref 13.0–17.0)
MCH: 29.3 pg (ref 26.0–34.0)
MCHC: 35 g/dL (ref 30.0–36.0)
MCV: 83.6 fL (ref 80.0–100.0)
Platelets: 315 10*3/uL (ref 150–400)
RBC: 5.67 MIL/uL (ref 4.22–5.81)
RDW: 12.7 % (ref 11.5–15.5)
WBC: 22.1 10*3/uL — ABNORMAL HIGH (ref 4.0–10.5)
nRBC: 0 % (ref 0.0–0.2)

## 2019-10-01 LAB — MAGNESIUM: Magnesium: 2.4 mg/dL (ref 1.7–2.4)

## 2019-10-01 LAB — PHOSPHORUS: Phosphorus: 3.9 mg/dL (ref 2.5–4.6)

## 2019-10-01 LAB — HEPARIN LEVEL (UNFRACTIONATED): Heparin Unfractionated: 0.38 IU/mL (ref 0.30–0.70)

## 2019-10-01 MED ORDER — CLONAZEPAM 0.25 MG PO TBDP
0.2500 mg | ORAL_TABLET | Freq: Two times a day (BID) | ORAL | Status: DC
  Administered 2019-10-01 – 2019-10-03 (×5): 0.25 mg via ORAL
  Filled 2019-10-01 (×5): qty 1

## 2019-10-01 MED ORDER — FUROSEMIDE 10 MG/ML IJ SOLN
20.0000 mg | Freq: Three times a day (TID) | INTRAMUSCULAR | Status: AC
  Administered 2019-10-01 (×2): 20 mg via INTRAVENOUS
  Filled 2019-10-01 (×2): qty 2

## 2019-10-01 NOTE — Progress Notes (Signed)
Subjective: Patient reports feeling better  Objective: Vital signs in last 24 hours: Temp:  [98 F (36.7 C)-99.1 F (37.3 C)] 98 F (36.7 C) (12/27 0800) Pulse Rate:  [43-97] 87 (12/27 0800) Resp:  [16-25] 21 (12/27 0800) BP: (108-155)/(50-125) 116/80 (12/27 0800) SpO2:  [96 %-100 %] 100 % (12/27 0800)  Intake/Output from previous day: 12/26 0701 - 12/27 0700 In: 190.4 [I.V.:190.4] Out: 3000 [Urine:3000] Intake/Output this shift: No intake/output data recorded.  Physical Exam: Stable weakness.  Swallowing improved, still having difficulty with breathing, but feels this is improving.  Lab Results: Recent Labs    09/30/19 0550 10/01/19 0233  WBC 18.0* 22.1*  HGB 15.4 16.6  HCT 45.0 47.4  PLT 290 315   BMET Recent Labs    09/30/19 0550 10/01/19 0233  NA 140 137  K 3.5 4.5  CL 104 98  CO2 25 25  GLUCOSE 120* 137*  BUN 26* 27*  CREATININE 1.21 1.33*  CALCIUM 8.8* 9.5    Studies/Results: DG Neck Soft Tissue  Result Date: 09/30/2019 CLINICAL DATA:  Shortness of breath and neck swelling. EXAM: NECK SOFT TISSUES - 1+ VIEW COMPARISON:  09/23/2019 FINDINGS: Nasopharynx, oropharynx, pharynx and hypopharyngeal region are normal. Prevertebral soft tissues are normal. Epiglottis and subglottic airway are normal. Anterior fusion hardware intact unchanged over the cervical spine remainder the exam is unchanged. IMPRESSION: No acute findings. Electronically Signed   By: Marin Olp M.D.   On: 09/30/2019 10:44   DG Chest Port 1 View  Result Date: 09/30/2019 CLINICAL DATA:  Acute respiratory failure with hypoxemia. EXAM: PORTABLE CHEST 1 VIEW COMPARISON:  09/29/2019 FINDINGS: Sternotomy wires unchanged. Lungs are hypoinflated with stable bibasilar opacification likely small effusions with associated basilar atelectasis. Cardiomediastinal silhouette and remainder of the exam is unchanged. IMPRESSION: Stable bibasilar opacification likely effusions with atelectasis.  Electronically Signed   By: Marin Olp M.D.   On: 09/30/2019 09:20    Assessment/Plan: Patient is progressing.  Still working on breathing per CCM. Patient wants solid food, but this is on hold while patient is on BiPAP.     LOS: 10 days    Peggyann Shoals, MD 10/01/2019, 8:39 AM

## 2019-10-01 NOTE — Progress Notes (Signed)
ANTICOAGULATION CONSULT NOTE - Follow Up Consult  Pharmacy Consult for heparin Indication: atrial fibrillation  Allergies  Allergen Reactions  . Vancomycin Anaphylaxis, Shortness Of Breath and Other (See Comments)    Was informed after heart surgery Breathing problem patient unsure  . Aldactone [Spironolactone]     GYNECOMASTIA   . Chlorthalidone Other (See Comments)    GOUT  . Nsaids Other (See Comments)    On blood thinner    Patient Measurements: Height: 5\' 6"  (167.6 cm) Weight: 241 lb (109.3 kg) IBW/kg (Calculated) : 63.8  Heparin Dosing Weight: 89 kg  Vital Signs: Temp: 99.1 F (37.3 C) (12/27 0400) Temp Source: Oral (12/27 0400) BP: 114/83 (12/27 0700) Pulse Rate: 87 (12/27 0700)  Labs: Recent Labs    09/29/19 0450 09/30/19 0550 10/01/19 0233  HGB 14.8 15.4 16.6  HCT 43.6 45.0 47.4  PLT 270 290 315  HEPARINUNFRC 0.29* 0.38 0.38  CREATININE 1.07 1.21 1.33*    Estimated Creatinine Clearance: 63.4 mL/min (A) (by C-G formula based on SCr of 1.33 mg/dL (H)).  Assessment: 68 yr old male presenting for neurosurgery - on apixaban pta for afib (last dose 09/17/19).   Pharmacy consulted to dose IV heparin while apixaban is on hold.  Heparin level remains therapeutic and stable this AM at 0.38 on 800 units/hr.  CBC is stable.  Prevertebral hematoma improving. Xray 12/26 - mild soft tissue swelling improved from xray on 12/19.  No overt bleeding or issues with infusion noted.   Goal of Therapy:  Heparin level 0.3-0.7 units/ml Monitor platelets by anticoagulation protocol: Yes   Plan:  Continue Heparin to 800 units/hr Monitor daily heparin level, CBC Monitor for signs/symptoms of bleeding  Sloan Leiter, PharmD, BCPS, BCCCP Clinical Pharmacist Please refer to [facility] for [place] numbers 10/01/2019, 7:19 AM

## 2019-10-01 NOTE — Progress Notes (Signed)
Physical Therapy Treatment Patient Details Name: [name] [name] MRN: [id number] DOB: [birth year] Today's Date: 10/01/2019    History of Present Illness Pt is a 68 yo male s/p ACDF C3-4, C4-5, C5-6 and plating.  12/18 developed respiratory distress and transferred to ICU.  Pt PMHx: CHF, MI, HTN, DJD, CAD,back sx, L TKA.    PT Comments    Continuing work on functional mobility and activity tolerance;  Able to perform 2 standing trials and take pivotal steps OOB to recliner; seen on 10 L supplemental O2 vial HFNC -- O2 sats decr as low as 81% with activity, incr back to 90s with rest breaks and focused breathing; Continue to recommend comprehensive inpatient rehab (CIR) for post-acute therapy needs -- will monitor closely for activity tolerance     Follow Up Recommendations  CIR     Equipment Recommendations  None recommended by PT    Recommendations for Other Services Rehab consult     Precautions / Restrictions Precautions Precautions: Cervical Precaution Comments: verbal discussion of handout and precautions Required Braces or Orthoses: Cervical Brace Cervical Brace: Hard collar Restrictions Weight Bearing Restrictions: No    Mobility  Bed Mobility Overal bed mobility: Needs Assistance Bed Mobility: Supine to Sit           General bed mobility comments: emplowyed bed to egress position to get up to sitting, then pt moved LEs sideways to EOB with min assist  Transfers Overall transfer level: Needs assistance Equipment used: 2 person hand held assist;Rolling walker (2 wheeled) Transfers: Sit to/from [facility] Sit to Stand: Min assist;+2 physical assistance Stand pivot transfers: Min assist;+2 physical assistance       General transfer comment: 2 trials of sit to stand and pivot to successfully get to chair; noted O2 sats decr to 81% with the effort; took a seated rest break before standing to RW from recliner; cues for controlled breathing;  assisted pt with cleaning bottom and perineal area when standing  Ambulation/Gait Ambulation/Gait assistance: Min assist Gait Distance (Feet): (Pivotal steps bed to chari) Assistive device: 2 person hand held assist       General Gait Details: decr functional capacity, limiting activity tolerance   Stairs             Wheelchair Mobility    Modified Rankin (Stroke Patients Only)       Balance     Sitting balance-Leahy Scale: Fair       Standing balance-Leahy Scale: Poor                              Cognition Arousal/Alertness: Awake/alert Behavior During Therapy: WFL for tasks assessed/performed Overall Cognitive Status: Within Functional Limits for tasks assessed                                 General Comments: per RN pt very anxious at baseline however pt eager to get OOB and worked well with PT today      Exercises      General Comments General comments (skin integrity, edema, etc.): Session conducted on 10 L supplemental O2 via HFNC      Pertinent Vitals/Pain Pain Assessment: Faces Faces Pain Scale: Hurts a little bit Pain Location: shds and neck Pain Descriptors / Indicators: Discomfort;Guarding Pain Intervention(s): Monitored during session    Home Living  Prior Function            PT Goals (current goals can now be found in the care plan section) Acute Rehab PT Goals Patient Stated Goal: wants to walk PT Goal Formulation: With patient/family Time For Goal Achievement: 10/06/19 Potential to Achieve Goals: Good Progress towards PT goals: Progressing toward goals    Frequency    Min 5X/week      PT Plan Current plan remains appropriate    Co-evaluation              AM-PAC PT "6 Clicks" Mobility   Outcome Measure  Help needed turning from your back to your side while in a flat bed without using bedrails?: A Lot Help needed moving from lying on your back to sitting  on the side of a flat bed without using bedrails?: A Lot Help needed moving to and from a bed to a chair (including a wheelchair)?: A Lot Help needed standing up from a chair using your arms (e.g., wheelchair or bedside chair)?: A Lot Help needed to walk in hospital room?: A Lot Help needed climbing 3-5 steps with a railing? : Total 6 Click Score: 11    End of Session Equipment Utilized During Treatment: Cervical collar;Gait belt Activity Tolerance: Patient tolerated treatment well Patient left: in chair;with family/visitor present;with call bell/phone within reach;with chair alarm set Nurse Communication: Mobility status PT Visit Diagnosis: Other abnormalities of gait and mobility (R26.89);Pain;Muscle weakness (generalized) (M62.81);Unsteadiness on feet (R26.81) Pain - part of body: (neck)     Time: 2956-2130 PT Time Calculation (min) (ACUTE ONLY): 26 min  Charges:  $Therapeutic Activity: 23-37 mins                     Van Clines, PT  Acute Rehabilitation Services Pager [phone] Office [phone]    [name] [name] 10/01/2019, 11:57 AM

## 2019-10-01 NOTE — Progress Notes (Signed)
Name: [name] [name] MRN: [id number] DOB: 1951/08/18    ADMISSION DATE:  09/21/2019 CONSULTATION DATE:  10/01/2019  REFERRING MD :  [name] [name]  CHIEF COMPLAINT: Respiratory distress  BRIEF PATIENT DESCRIPTION: 68 year old man with OSA, hypertension, atrial fibrillation underwent multilevel ACDF on 12/17 and developed acute respiratory distress 12/18 requiring transfer to ICU  SIGNIFICANT EVENTS  12/19 transferred to ICU for respiratory distress  12/21 tolerating BiPAP more so than HFNC   STUDIES:  CXR 12/21>Low lung volumes. Cardiomegaly.  atelectasis, elevated R hemidiaphragm.  12/21 BLE Venous doppler>> neg for DVT   SUBJECTIVE:  Agitation overnight requiring precedex BiPAP overnight Sleepy this AM but protecting his airway  VITAL SIGNS: Temp:  [98 F (36.7 C)-99.1 F (37.3 C)] 98 F (36.7 C) (12/27 0800) Pulse Rate:  [43-98] 98 (12/27 0900) Resp:  [16-25] 24 (12/27 0900) BP: (108-153)/(50-125) 132/66 (12/27 0900) SpO2:  [96 %-100 %] 97 % (12/27 0900)  PHYSICAL EXAMINATION: Gen. Chronically ill appearing male, NAD HEENT: [place]/AT, PERRL, EOM-I and MMM, incision with hematoma in the anterior neck slowly improving Lungs: Diminished diffusely Cardiovascular: RRR, Nl S1/S2 and -M/R/G Abdomen: Obese, soft, NT, ND and +BS Musculoskeletal: No obvious deformity, no cyanosis or clubbing  Neuro:  AAOx4 following commands    I reviewed CXR myself, low volume  Discussed with PCCM-NP  Recent Labs  Lab 09/29/19 0450 09/30/19 0550 10/01/19 0233  NA 141 140 137  K 3.4* 3.5 4.5  CL 102 104 98  CO2 27 25 25   BUN 37* 26* 27*  CREATININE 1.07 1.21 1.33*  GLUCOSE 121* 120* 137*   Recent Labs  Lab 09/29/19 0450 09/30/19 0550 10/01/19 0233  HGB 14.8 15.4 16.6  HCT 43.6 45.0 47.4  WBC 15.1* 18.0* 22.1*  PLT 270 290 315   DG Neck Soft Tissue  Result Date: 09/30/2019 CLINICAL DATA:  Shortness of breath and neck swelling. EXAM: NECK SOFT TISSUES - 1+ VIEW COMPARISON:   09/23/2019 FINDINGS: Nasopharynx, oropharynx, pharynx and hypopharyngeal region are normal. Prevertebral soft tissues are normal. Epiglottis and subglottic airway are normal. Anterior fusion hardware intact unchanged over the cervical spine remainder the exam is unchanged. IMPRESSION: No acute findings. Electronically Signed   By: [name] [name] M.D.   On: 09/30/2019 10:44   DG Chest Port 1 View  Result Date: 09/30/2019 CLINICAL DATA:  Acute respiratory failure with hypoxemia. EXAM: PORTABLE CHEST 1 VIEW COMPARISON:  09/29/2019 FINDINGS: Sternotomy wires unchanged. Lungs are hypoinflated with stable bibasilar opacification likely small effusions with associated basilar atelectasis. Cardiomediastinal silhouette and remainder of the exam is unchanged. IMPRESSION: Stable bibasilar opacification likely effusions with atelectasis. Electronically Signed   By: [name] [name] M.D.   On: 09/30/2019 09:20    ASSESSMENT / PLAN:  Acute hypoxic respiratory failure- suspect multifactorial in setting post op swelling and hematoma s/p ACDF likely c/b mild volume overload. Hematoma and volume overload appear improved but increased oxygen demands and bipap needs continue. ??diaphragmatic injury v PE (no DVT on BLE dopplers).   Concern here is that patient gets so agitated and puts himself in negative pressure pulmonary edema P - BiPAP to PRN but mandatory at night - SNIF test ordered, not sure if will be performed over the weekend - HFNC, titrate for sat of 88-92% - Monitor closely for hematoma obstructing airway - SLP - D/C precedex - Add low dose clonazepam for chronic anxiety  - Pulmonary hygiene as able  - IS and flutter valve - Check PCT, if high will start  abx - Ambulate - OOB to chair - Decadron discontinued by nsgy  - Will add low dose diureses again lasix 20 x2 doses - KVO IVF - HR control  - Start anti-coagulation, hold of for CTA to r/o PE for now, will be anti-coagulated  Hypertension/atrial  fibrillation AFib with RVR  PLAN -  - Hold cardizem and lopressor given bradycardia - Lasix 20 mg IV q8 x2 doses - Replace electrolytes as needed - Strict I/O - Heparin per pharmacy - PO anti-coagulation when hematoma is more stable  Leukocytosis -likely r/t steroids; afebrile P - Continue to trend WBC, fever curve. No utility for abx at present  Hold in the ICU given respiratory failure needs D/C precedex SNIF test ordered PCCM will continue to follow  The patient is critically ill with multiple organ systems failure and requires high complexity decision making for assessment and support, frequent evaluation and titration of therapies, application of advanced monitoring technologies and extensive interpretation of multiple databases.   Critical Care Time devoted to patient care services described in this note is  32  Minutes. This time reflects time of care of this signee Dr [name] [name]. This critical care time does not reflect procedure time, or teaching time or supervisory time of PA/NP/Med student/Med Resident etc but could involve care discussion time.  [name] [name], M.D. [facility] Pulmonary/Critical Care Medicine.

## 2019-10-02 ENCOUNTER — Inpatient Hospital Stay (HOSPITAL_COMMUNITY): Payer: Medicare Other

## 2019-10-02 LAB — BASIC METABOLIC PANEL
Anion gap: 13 (ref 5–15)
BUN: 36 mg/dL — ABNORMAL HIGH (ref 8–23)
CO2: 30 mmol/L (ref 22–32)
Calcium: 9.5 mg/dL (ref 8.9–10.3)
Chloride: 92 mmol/L — ABNORMAL LOW (ref 98–111)
Creatinine, Ser: 1.45 mg/dL — ABNORMAL HIGH (ref 0.61–1.24)
GFR calc Af Amer: 58 mL/min — ABNORMAL LOW (ref >60)
GFR calc non Af Amer: 50 mL/min — ABNORMAL LOW (ref >60)
Glucose, Bld: 132 mg/dL — ABNORMAL HIGH (ref 70–99)
Potassium: 4.1 mmol/L (ref 3.5–5.1)
Sodium: 135 mmol/L (ref 135–145)

## 2019-10-02 LAB — PHOSPHORUS: Phosphorus: 4.8 mg/dL — ABNORMAL HIGH (ref 2.5–4.6)

## 2019-10-02 LAB — PROCALCITONIN: Procalcitonin: <0.1 ng/mL

## 2019-10-02 LAB — CBC
HCT: 46.7 % (ref 39.0–52.0)
Hemoglobin: 16.5 g/dL (ref 13.0–17.0)
MCH: 29.6 pg (ref 26.0–34.0)
MCHC: 35.3 g/dL (ref 30.0–36.0)
MCV: 83.7 fL (ref 80.0–100.0)
Platelets: 305 10*3/uL (ref 150–400)
RBC: 5.58 MIL/uL (ref 4.22–5.81)
RDW: 12.5 % (ref 11.5–15.5)
WBC: 28.9 10*3/uL — ABNORMAL HIGH (ref 4.0–10.5)
nRBC: 0 % (ref 0.0–0.2)

## 2019-10-02 LAB — MAGNESIUM: Magnesium: 2.3 mg/dL (ref 1.7–2.4)

## 2019-10-02 LAB — HEPARIN LEVEL (UNFRACTIONATED): Heparin Unfractionated: 0.55 IU/mL (ref 0.30–0.70)

## 2019-10-02 MED ORDER — DILTIAZEM HCL ER COATED BEADS 180 MG PO CP24
180.0000 mg | ORAL_CAPSULE | Freq: Every day | ORAL | Status: DC
  Administered 2019-10-02 – 2019-10-03 (×2): 180 mg via ORAL
  Filled 2019-10-02 (×2): qty 1

## 2019-10-02 NOTE — Progress Notes (Addendum)
Inpatient Rehabilitation Admissions Coordinator  Inpatient rehab consult received. I met with patient at bedside for rehab assessment. We discussed goals and expectation of an inpt rehab admit.Pt is in agreement. Wife came to visit and we discussed the same. She prefers inpt rehab . I will follow up tomorrow for possible admit when felt medically ready to d/c.  Danne Baxter, RN, MSN Rehab Admissions Coordinator [phone] 10/02/2019 1:41 PM

## 2019-10-02 NOTE — Progress Notes (Signed)
Physical Therapy Treatment Patient Details Name: [name] [name] MRN: [id number] DOB: 1951/10/03 Today's Date: 10/02/2019    History of Present Illness Pt is a 68 yo male s/p ACDF C3-4, C4-5, C5-6 and plating.  12/18 developed respiratory distress and transferred to ICU.  Pt PMHx: CHF, MI, HTN, DJD, CAD,back sx, L TKA.    PT Comments    Pt agreeable to PT this session, although pt reports being fatigued from being up to chair earlier in the day. Pt ambulated to and from bathroom with min-mod +2, pt with 2 LOB upon initial standing requiring mod steadying assist to correct. Pt with HR up to 120s bpm during session, but sats WFL on 8LO2 via HFNC. PT to continue to follow acutely, continuing to recommend CIR post-acutely.    Follow Up Recommendations  CIR     Equipment Recommendations  None recommended by PT    Recommendations for Other Services Rehab consult     Precautions / Restrictions Precautions Precautions: Cervical Precaution Comments: verbal discussion of handout and precautions - Pt able to state no lifting, no bending spine Required Braces or Orthoses: Cervical Brace Cervical Brace: Hard collar Restrictions Weight Bearing Restrictions: No    Mobility  Bed Mobility Overal bed mobility: Needs Assistance Bed Mobility: Sit to Sidelying;Sidelying to Sit;Rolling Rolling: Min assist Sidelying to sit: Min assist   Sit to supine: Mod assist;+2 for safety/equipment;+2 for physical assistance;HOB elevated   General bed mobility comments: Min assist for roll to R with verbal cuing for hand placement on R handrail to assist. Mod assist +1-2 for trunk elevation/lowering, LE lifting into bed upon return to supine. Pt able to scoot to and from EOB with increased time and verbal cuing.  Transfers Overall transfer level: Needs assistance Equipment used: Rolling walker (2 wheeled) Transfers: Sit to/from Stand Sit to Stand: Min assist;+2 safety/equipment;From elevated  surface Stand pivot transfers: Min assist;+2 physical assistance;+2 safety/equipment;From elevated surface       General transfer comment: Min assist for power up, steadying, with +2 for lines/leads. Verbal cuing for hand placement, not followed as pt reports he wanted to use RW to pull up on. Suspect due to impaired grip strength. Sit to stand x4, from bed and BSC x3.  Ambulation/Gait Ambulation/Gait assistance: Min assist;+2 safety/equipment Gait Distance (Feet): 20 Feet(10+10 to and from bathroom) Assistive device: Rolling walker (2 wheeled) Gait Pattern/deviations: Step-through pattern;Decreased stride length;Drifts right/left;Trunk flexed Gait velocity: decr   General Gait Details: Min assist for steadying, pt with 2 LOB during initial steps after standing requiring mod steadying assist at gait belt to correct. Verbal cuing for placement in RW, upright posture.   Stairs             Wheelchair Mobility    Modified Rankin (Stroke Patients Only)       Balance Overall balance assessment: Needs assistance Sitting-balance support: Feet supported;Single extremity supported Sitting balance-Leahy Scale: Fair     Standing balance support: Bilateral upper extremity supported Standing balance-Leahy Scale: Poor Standing balance comment: reliant on external support                            Cognition Arousal/Alertness: Awake/alert Behavior During Therapy: WFL for tasks assessed/performed Overall Cognitive Status: Within Functional Limits for tasks assessed                                 General  Comments: pt reports anxiety post-session, suspect due to tachypnea and pt reporting increased work of breathing. Pt making multiple jokes during PT session today.      Exercises      General Comments General comments (skin integrity, edema, etc.): on 8LO2 via [place], VSS with RR up to 30 breaths/min      Pertinent Vitals/Pain Pain Assessment:  Faces Faces Pain Scale: Hurts a little bit Pain Location: shoulders and neck Pain Descriptors / Indicators: Discomfort;Guarding Pain Intervention(s): Monitored during session;Limited activity within patient's tolerance;Repositioned    Home Living Family/patient expects to be discharged to:: Private residence Living Arrangements: Spouse/significant other Available Help at Discharge: Family Type of Home: House Home Access: Stairs to enter   Home Layout: One level Home Equipment: Environmental consultant - 2 wheels;Cane - single point      Prior Function Level of Independence: Needs assistance  Gait / Transfers Assistance Needed: Increased time ADL's / Homemaking Assistance Needed: Pt requiring assist as LUE limited due to previous injury and RUE limited by new weakness and decreased coordination.      PT Goals (current goals can now be found in the care plan section) Acute Rehab PT Goals Patient Stated Goal: walk PT Goal Formulation: With patient/family Time For Goal Achievement: 10/06/19 Potential to Achieve Goals: Good Progress towards PT goals: Progressing toward goals    Frequency    Min 5X/week      PT Plan Current plan remains appropriate    Co-evaluation PT/OT/SLP Co-Evaluation/Treatment: Yes Reason for Co-Treatment: Complexity of the patient's impairments (multi-system involvement);For patient/therapist safety PT goals addressed during session: Mobility/safety with mobility        AM-PAC PT "6 Clicks" Mobility   Outcome Measure  Help needed turning from your back to your side while in a flat bed without using bedrails?: A Little Help needed moving from lying on your back to sitting on the side of a flat bed without using bedrails?: A Lot Help needed moving to and from a bed to a chair (including a wheelchair)?: A Lot Help needed standing up from a chair using your arms (e.g., wheelchair or bedside chair)?: A Lot Help needed to walk in hospital room?: A Little Help needed  climbing 3-5 steps with a railing? : Total 6 Click Score: 13    End of Session Equipment Utilized During Treatment: Cervical collar;Gait belt Activity Tolerance: Patient tolerated treatment well;Patient limited by fatigue Patient left: with family/visitor present;with call bell/phone within reach;in bed;with bed alarm set;with SCD's reapplied Nurse Communication: Mobility status;Other (comment)(pt requesting ativan, RN notified) PT Visit Diagnosis: Other abnormalities of gait and mobility (R26.89);Pain;Muscle weakness (generalized) (M62.81);Unsteadiness on feet (R26.81) Pain - part of body: (neck)     Time: 5366-4403 PT Time Calculation (min) (ACUTE ONLY): 29 min  Charges:  $Gait Training: 8-22 mins                     Ringo Sherod E, PT [place] Pager [phone]  Office [phone]    Azrael Huss D Elonda Husky 10/02/2019, 3:42 PM

## 2019-10-02 NOTE — Progress Notes (Signed)
Subjective: The patient complains that he is hungry and he does not want to eat the soft diet any longer.  I encouraged him to get out of bed and mobilize.  Objective: Vital signs in last 24 hours: Temp:  [97.9 F (36.6 C)-98.3 F (36.8 C)] 98 F (36.7 C) (12/28 0400) Pulse Rate:  [28-110] 85 (12/28 0800) Resp:  [17-28] 24 (12/28 0800) BP: (114-145)/(51-112) 135/65 (12/28 0800) SpO2:  [94 %-100 %] 97 % (12/28 0800) Estimated body mass index is 38.9 kg/m as calculated from the following:   Height as of this encounter: 5\' 6"  (1.676 m).   Weight as of this encounter: 109.3 kg.   Intake/Output from previous day: 12/27 0701 - 12/28 0700 In: 182.6 [I.V.:182.6] Out: 1375 [Urine:1375] Intake/Output this shift: Total I/O In: 16 [I.V.:16] Out: -   Physical exam the patient is alert and pleasant.  His wound is healing well.  He is moving all 4 extremities.  He has weakness in his bilateral deltoid without change.  Lab Results: Recent Labs    10/01/19 0233 10/02/19 0647  WBC 22.1* 28.9*  HGB 16.6 16.5  HCT 47.4 46.7  PLT 315 305   BMET Recent Labs    10/01/19 0233 10/02/19 0647  NA 137 135  K 4.5 4.1  CL 98 92*  CO2 25 30  GLUCOSE 137* 132*  BUN 27* 36*  CREATININE 1.33* 1.45*  CALCIUM 9.5 9.5    Studies/Results: DG Neck Soft Tissue  Result Date: 09/30/2019 CLINICAL DATA:  Shortness of breath and neck swelling. EXAM: NECK SOFT TISSUES - 1+ VIEW COMPARISON:  09/23/2019 FINDINGS: Nasopharynx, oropharynx, pharynx and hypopharyngeal region are normal. Prevertebral soft tissues are normal. Epiglottis and subglottic airway are normal. Anterior fusion hardware intact unchanged over the cervical spine remainder the exam is unchanged. IMPRESSION: No acute findings. Electronically Signed   By: Marin Olp M.D.   On: 09/30/2019 10:44    Assessment/Plan: Postop day #11: The patient is breathing better today.  His cervical x-ray demonstrates his prevertebral swelling has  largely resolved.  I have encouraged him to mobilize.  I will ask rehab to see him.  Leukocytosis: This is likely secondary to his pulmonary issues.  His neck wound does not show any signs of infection.  Pulmonary is following.  LOS: 11 days     Ophelia Charter 10/02/2019, 8:53 AM

## 2019-10-02 NOTE — Evaluation (Signed)
Occupational Therapy Evaluation Patient Details Name: [name] [name] MRN: [id number] DOB: Apr 14, 1951 Today's Date: 10/02/2019    History of Present Illness Pt is a 68 yo male s/p ACDF C3-4, C4-5, C5-6 and plating.  12/18 developed respiratory distress and transferred to ICU.  Pt PMHx: CHF, MI, HTN, DJD, CAD,back sx, L TKA.   Clinical Impression   Pt PTA: Pt seen for cervical sx and pt required prolonged hospital stay so OTR is performing Re-eval. Pt was mostly independent prior with assist from spouse as pt's BUE shoulder AROM were very limited. Pt performing ADL functional mobility and transfers with minA to modA +2 for stability as pt LOB episodes mostly on R side. Pt unable to properly grip RW for stability. Pt's BUEs with decreased coordination and poor grip strength. Pt minA to maxA overall for ADL. Pt would benefit from continued OT skilled services for ADL, mobility and safety in CIR setting. Pt has great spouse support at home, but could really increase independence with ADL in CIR setting.       Follow Up Recommendations  CIR;Supervision/Assistance - 24 hour    Equipment Recommendations  Other (comment)(to be determined at next venue)    Recommendations for Other Services       Precautions / Restrictions Precautions Precautions: Cervical Precaution Comments: verbal discussion of handout and precautions Required Braces or Orthoses: Cervical Brace Cervical Brace: Hard collar Restrictions Weight Bearing Restrictions: No      Mobility Bed Mobility Overal bed mobility: Needs Assistance Bed Mobility: Supine to Sit;Sit to Sidelying;Sit to Supine Rolling: Min assist Sidelying to sit: Min assist   Sit to supine: Mod assist;+2 for safety/equipment;+2 for physical assistance;HOB elevated   General bed mobility comments: MinA with use of rail for and trunk elevation; sitting EOB to supine, assist with BLEs.  Transfers Overall transfer level: Needs assistance Equipment  used: Rolling walker (2 wheeled) Transfers: Sit to/from [employer] Sit to Stand: Min assist;Mod assist;+2 physical assistance;+2 safety/equipment Stand pivot transfers: Min assist;Mod assist;+2 physical assistance;+2 safety/equipment;From elevated surface       General transfer comment: Pt able to stand with RW for pivot and for ambulation with assist for steadying    Balance Overall balance assessment: Needs assistance Sitting-balance support: Feet supported;Single extremity supported Sitting balance-Leahy Scale: Fair     Standing balance support: Bilateral upper extremity supported Standing balance-Leahy Scale: Poor Standing balance comment: reliant on external support                           ADL either performed or assessed with clinical judgement   ADL Overall ADL's : Needs assistance/impaired Eating/Feeding: Set up;Sitting   Grooming: Minimal assistance;Sitting;Standing   Upper Body Bathing: Moderate assistance;Sitting   Lower Body Bathing: Maximal assistance;Cueing for safety   Upper Body Dressing : Moderate assistance;Sitting   Lower Body Dressing: Maximal assistance;Sitting/lateral leans;Sit to/from stand   Toilet Transfer: Minimal assistance;+2 for physical assistance;+2 for safety/equipment;Ambulation;BSC;RW   Toileting- Clothing Manipulation and Hygiene: Moderate assistance;Sitting/lateral lean;Sit to/from stand;Adhering to back precautions       Functional mobility during ADLs: Minimal assistance;+2 for physical assistance;+2 for safety/equipment;Rolling walker;Cueing for safety General ADL Comments: Pt education on back precautions and reducing lean at sink.     Vision Baseline Vision/History: No visual deficits Patient Visual Report: No change from baseline Vision Assessment?: No apparent visual deficits     Perception     Praxis      Pertinent Vitals/Pain Pain Assessment:  Faces Faces Pain Scale: Hurts a little  bit Pain Location: shoulders and neck Pain Descriptors / Indicators: Discomfort;Guarding Pain Intervention(s): Monitored during session     Hand Dominance Right   Extremity/Trunk Assessment Upper Extremity Assessment Upper Extremity Assessment: Generalized weakness RUE Deficits / Details: poor grip strength; below shoulder movements, slow and decreased coordination; Pt AROM to 35* shoulder flex. RUE Coordination: decreased fine motor;decreased gross motor LUE Deficits / Details: AROM 45* shoulder flex. Pt with decreased coordination and poor grip strength LUE Coordination: decreased fine motor;decreased gross motor       Cervical / Trunk Assessment Cervical / Trunk Assessment: Other exceptions Cervical / Trunk Exceptions: s/p cervical sx   Communication Communication Communication: No difficulties   Cognition Arousal/Alertness: Awake/alert Behavior During Therapy: WFL for tasks assessed/performed Overall Cognitive Status: Within Functional Limits for tasks assessed                                 General Comments: Pt asking for Ativan s/p PT/OT session. RN aware.   General Comments  on 8L O2 >87% O2 throughout session    Exercises     Shoulder Instructions      Home Living Family/patient expects to be discharged to:: Private residence Living Arrangements: Spouse/significant other Available Help at Discharge: Family Type of Home: House Home Access: Stairs to enter Secretary/administrator of Steps: 1   Home Layout: One level     Bathroom Shower/Tub: Producer, television/film/video: Handicapped height     Home Equipment: Environmental consultant - 2 wheels;Cane - single point          Prior Functioning/Environment Level of Independence: Needs assistance  Gait / Transfers Assistance Needed: Increased time ADL's / Homemaking Assistance Needed: Pt requiring assist as LUE limited due to previous injury and RUE limited by new weakness and decreased coordination.              OT Problem List: Decreased strength;Decreased activity tolerance;Impaired balance (sitting and/or standing);Increased edema;Impaired UE functional use;Pain      OT Treatment/Interventions: Self-care/ADL training;Therapeutic exercise;Neuromuscular education;Energy conservation;Therapeutic activities;Patient/family education;Balance training;DME and/or AE instruction    OT Goals(Current goals can be found in the care plan section) Acute Rehab OT Goals Patient Stated Goal: walk OT Goal Formulation: With patient Time For Goal Achievement: 10/06/19 Potential to Achieve Goals: Good ADL Goals Pt Will Perform Lower Body Dressing: sit to/from stand;sitting/lateral leans;with min guard assist Pt Will Transfer to Toilet: with min guard assist;ambulating;regular height toilet Pt/caregiver will Perform Home Exercise Program: Increased strength;Both right and left upper extremity;With Supervision  OT Frequency: Min 2X/week   Barriers to D/C:            Co-evaluation PT/OT/SLP Co-Evaluation/Treatment: Yes Reason for Co-Treatment: Complexity of the patient's impairments (multi-system involvement) PT goals addressed during session: Mobility/safety with mobility OT goals addressed during session: ADL's and self-care      AM-PAC OT "6 Clicks" Daily Activity     Outcome Measure Help from another person eating meals?: A Little Help from another person taking care of personal grooming?: A Lot Help from another person toileting, which includes using toliet, bedpan, or urinal?: A Lot Help from another person bathing (including washing, rinsing, drying)?: A Lot Help from another person to put on and taking off regular upper body clothing?: A Lot Help from another person to put on and taking off regular lower body clothing?: A Lot 6 Click Score: 13  End of Session Equipment Utilized During Treatment: Cervical collar Nurse Communication: Mobility status  Activity Tolerance: Patient  tolerated treatment well;Patient limited by pain Patient left: in chair;with call bell/phone within reach;with family/visitor present;with chair alarm set  OT Visit Diagnosis: Unsteadiness on feet (R26.81);Muscle weakness (generalized) (M62.81);Pain Pain - Right/Left: Left Pain - part of body: Shoulder                Time: 8299-3716 OT Time Calculation (min): 32 min Charges:  OT General Charges $OT Visit: 1 Visit OT Evaluation $OT Re-eval: 1 Re-eval  [name] Nestle) Marsa Aris OTR/L Acute Rehabilitation Services Pager: [phone] Office: [place] 10/02/2019, 4:33 PM

## 2019-10-02 NOTE — Progress Notes (Signed)
ANTICOAGULATION CONSULT NOTE - Follow Up Consult  Pharmacy Consult for heparin Indication: atrial fibrillation  Allergies  Allergen Reactions  . Vancomycin Anaphylaxis, Shortness Of Breath and Other (See Comments)    Was informed after heart surgery Breathing problem patient unsure  . Aldactone [Spironolactone]     GYNECOMASTIA   . Chlorthalidone Other (See Comments)    GOUT  . Nsaids Other (See Comments)    On blood thinner    Patient Measurements: Height: 5\' 6"  (167.6 cm) Weight: 241 lb (109.3 kg) IBW/kg (Calculated) : 63.8  Heparin Dosing Weight: 89 kg  Vital Signs: Temp: 98 F (36.7 C) (12/28 0400) Temp Source: Oral (12/28 0400) BP: 122/75 (12/28 0745) Pulse Rate: 106 (12/28 0745)  Labs: Recent Labs    09/30/19 0550 10/01/19 0233 10/02/19 0647  HGB 15.4 16.6 16.5  HCT 45.0 47.4 46.7  PLT 290 315 305  HEPARINUNFRC 0.38 0.38 0.55  CREATININE 1.21 1.33* 1.45*    Estimated Creatinine Clearance: 58.1 mL/min (A) (by C-G formula based on SCr of 1.45 mg/dL (H)).  Assessment: 68 yr old male presenting for neurosurgery - on apixaban pta for afib (last dose 09/17/19).   Pharmacy consulted to dose IV heparin while apixaban is on hold.  Heparin level remains therapeutic and stable.  No overt bleeding reported. Prevertebral hematoma improving. Xray 12/26 - mild soft tissue swelling improved from xray on 12/19.   Goal of Therapy:  Heparin level 0.3-0.7 units/ml Monitor platelets by anticoagulation protocol: Yes   Plan:  Continue heparin infusion at 800 units/hr  Daily heparin level and CBC, monitor for s/sx of bleeding  Rafia Shedden D. Mina Marble, PharmD, BCPS, [place] 10/02/2019, 8:19 AM

## 2019-10-02 NOTE — Progress Notes (Signed)
NAME:  [name] [name], MRN:  [id number], DOB:  1950/11/28, LOS: 48 ADMISSION DATE:  09/21/2019, CONSULTATION DATE:  12/27 REFERRING MD:  Dr. Aquilla Hacker, CHIEF COMPLAINT:  Respiratory distress    Brief History   68 year old man with OSA, hypertension, atrial fibrillation underwent multilevel ACDF on 12/17 and developed acute respiratory distress 12/18 requiring transfer to ICU  Past Medical History  Persistent atrial fibrillation Spinal stenosis Sleep apnea Obesity Osteoarthritis MI in 2004 Hypertension Hyperlipidemia GERD Congestive heart failure [facility]   12/17 admitted 12/19 transferred to ICU for respiratory distress  12/21 tolerating BiPAP more so than HFNC   Consults:  PCCM  Procedures:  Cervical spine x-ray > Intraoperative localization for C3-C6 ACDF.  CXR 12/21 > Low lung volumes. Cardiomegaly.  atelectasis, elevated R hemidiaphragm.   12/21 BLE Venous doppler > neg for DVT   Significant Diagnostic Tests:    Micro Data:    Antimicrobials:    Interim history/subjective:  Sitting up in bedside recliner, states he feels well with no acute complains.   Objective   Blood pressure 135/65, pulse 85, temperature 98 F (36.7 C), temperature source Oral, resp. rate (!) 24, height 5\' 6"  (1.676 m), weight 109.3 kg, SpO2 97 %.        Intake/Output Summary (Last 24 hours) at 10/02/2019 0913 Last data filed at 10/02/2019 0800 Gross per 24 hour  Intake 182.63 ml  Output 1375 ml  Net -1192.37 ml   Filed Weights   09/21/19 0809  Weight: 109.3 kg    Examination: General: Chronically ill appearing elderly mal, in NAD HEENT: [place]/AT, hematoma to anterior neck at surgical site, MM pink/moist, PERRL,  Neuro: Alert and oriented x3, able to follow all commands, non-focal  CV: Tachycardia, s1s2 regular rate and rhythm, no murmur, rubs, or gallops,  PULM:  Clear to ascultation bilaterally, no increased work of breathing  GI: soft, bowel  sounds active in all 4 quadrants, non-tender, non-distended Extremities: warm/dry, no edema  Skin: no rashes or lesions   Resolved Hospital Problem list     Assessment & Plan:  Acute hypoxic respiratory failure -Suspect multifactorial in setting post op swelling and hematoma s/p ACDF likely c/b mild volume overload. Hematoma and volume overload appear improved but increased oxygen demands and bipap needs continue.  -Question diaphragmatic injury v PE (no DVT on BLE dopplers).   Concern here is that patient gets so agitated and puts himself in negative pressure pulmonary edema Plan: BiPAP as needed during the day but mandatory at night High flow nasal cannula with goal between 88 to 90% Wean supplemental oxygen as able  SNIF test pending Continue to monitor closely for hematoma status post ACDF SLP Encourage frequent pulmonary hygiene Encourage frequent use of I-S Low-dose diuresis Heparin drip for anticoagulation  Hypertension/atrial fibrillation AFib with RVR  Plan: Gentle diuresis resumed 12/27, continue Monitor and replace electrolytes as needed Strict intake and output Daily weight Resume home cardizem  Heparin drip for anticoagulation We will need to resume oral anticoagulation once cleared per neurosurgery  Leukocytosis -likely r/t steroids; afebrile Plan: Continue to trend WBCs and fever curve No indications for antibiotic therapy at this time   PCCM will continue to follow  Best practice:  Diet: regular Pain/Anxiety/Delirium protocol (if indicated): PRNs VAP protocol (if indicated): [name] DVT prophylaxis: IV heparin drip  GI prophylaxis: PPI Glucose control: monitor  Mobility: up with assistance  Code Status: Full Family Communication: Per neurology  Disposition: Transfer per neurosurgery  Labs   CBC: Recent Labs  Lab 09/28/19 0128 09/29/19 0450 09/30/19 0550 10/01/19 0233 10/02/19 0647  WBC 13.7* 15.1* 18.0* 22.1* 28.9*  HGB 14.2 14.8 15.4  16.6 16.5  HCT 40.6 43.6 45.0 47.4 46.7  MCV 84.8 86.2 84.6 83.6 83.7  PLT 266 270 290 315 305    Basic Metabolic Panel: Recent Labs  Lab 09/28/19 0128 09/29/19 0450 09/30/19 0550 10/01/19 0233 10/02/19 0647  NA 142 141 140 137 135  K 3.6 3.4* 3.5 4.5 4.1  CL 107 102 104 98 92*  CO2 28 27 25 25 30   GLUCOSE 144* 121* 120* 137* 132*  BUN 46* 37* 26* 27* 36*  CREATININE 0.96 1.07 1.21 1.33* 1.45*  CALCIUM 8.9 9.0 8.8* 9.5 9.5  MG 2.7* 2.4 2.3 2.4 2.3  PHOS 3.1 2.8 3.1 3.9 4.8*   GFR: Estimated Creatinine Clearance: 58.1 mL/min (A) (by C-G formula based on SCr of 1.45 mg/dL (H)). Recent Labs  Lab 09/29/19 0450 09/30/19 0550 09/30/19 1213 10/01/19 0233 10/02/19 0647  PROCALCITON  --   --  <0.10  --   --   WBC 15.1* 18.0*  --  22.1* 28.9*    Liver Function Tests: No results for input(s): AST, ALT, ALKPHOS, BILITOT, PROT, ALBUMIN in the last 168 hours. No results for input(s): LIPASE, AMYLASE in the last 168 hours. No results for input(s): AMMONIA in the last 168 hours.  ABG    Component Value Date/Time   PHART 7.474 (H) 09/27/2019 0008   PCO2ART 42.6 09/27/2019 0008   PO2ART 81.0 (L) 09/27/2019 0008   HCO3 31.4 (H) 09/27/2019 0008   TCO2 33 (H) 09/27/2019 0008   O2SAT 97.0 09/27/2019 0008     Coagulation Profile: No results for input(s): INR, PROTIME in the last 168 hours.  Cardiac Enzymes: No results for input(s): CKTOTAL, CKMB, CKMBINDEX, TROPONINI in the last 168 hours.  HbA1C: Hgb A1c MFr Bld  Date/Time Value Ref Range Status  03/28/2015 05:17 AM 6.2 (H) 4.8 - 5.6 % Final    Comment:    (NOTE)         Pre-diabetes: 5.7 - 6.4         Diabetes: >6.4         Glycemic control for adults with diabetes: <7.0   07/25/2014 10:16 AM 6.0 (H) <5.7 % Final    Comment:                                                                           According to the ADA Clinical Practice Recommendations for 2011, when HbA1c is used as a screening test:     >=6.5%    Diagnostic of Diabetes Mellitus            (if abnormal result is confirmed)   5.7-6.4%   Increased risk of developing Diabetes Mellitus   References:Diagnosis and Classification of Diabetes Mellitus,Diabetes Care,2011,34(Suppl 1):S62-S69 and Standards of Medical Care in         Diabetes - 2011,Diabetes Care,2011,34 (Suppl 1):S11-S61.      CBG: No results for input(s): GLUCAP in the last 168 hours.     Critical care time:    Delfin Gant, NP-C [facility] Contact / Pager information can be found on Amion  10/02/2019, 9:33 AM

## 2019-10-03 ENCOUNTER — Encounter (HOSPITAL_COMMUNITY): Payer: Self-pay | Admitting: Physical Medicine and Rehabilitation

## 2019-10-03 ENCOUNTER — Inpatient Hospital Stay (HOSPITAL_COMMUNITY)
Admission: RE | Admit: 2019-10-03 | Discharge: 2019-10-17 | DRG: 559 | Disposition: A | Discharge status: Home / Self Care | Payer: Medicare Other | Source: Intra-hospital | Attending: Physical Medicine and Rehabilitation | Admitting: Physical Medicine and Rehabilitation

## 2019-10-03 ENCOUNTER — Inpatient Hospital Stay (HOSPITAL_COMMUNITY): Payer: Medicare Other

## 2019-10-03 ENCOUNTER — Other Ambulatory Visit: Payer: Self-pay

## 2019-10-03 DIAGNOSIS — T380X5A Adverse effect of glucocorticoids and synthetic analogues, initial encounter: Secondary | ICD-10-CM | POA: Diagnosis present

## 2019-10-03 DIAGNOSIS — Z79899 Other long term (current) drug therapy: Secondary | ICD-10-CM

## 2019-10-03 DIAGNOSIS — I5032 Chronic diastolic (congestive) heart failure: Secondary | ICD-10-CM | POA: Diagnosis present

## 2019-10-03 DIAGNOSIS — J9601 Acute respiratory failure with hypoxia: Secondary | ICD-10-CM | POA: Diagnosis not present

## 2019-10-03 DIAGNOSIS — M7989 Other specified soft tissue disorders: Secondary | ICD-10-CM | POA: Diagnosis not present

## 2019-10-03 DIAGNOSIS — K592 Neurogenic bowel, not elsewhere classified: Secondary | ICD-10-CM | POA: Diagnosis not present

## 2019-10-03 DIAGNOSIS — N319 Neuromuscular dysfunction of bladder, unspecified: Secondary | ICD-10-CM | POA: Diagnosis present

## 2019-10-03 DIAGNOSIS — E785 Hyperlipidemia, unspecified: Secondary | ICD-10-CM | POA: Diagnosis not present

## 2019-10-03 DIAGNOSIS — H9190 Unspecified hearing loss, unspecified ear: Secondary | ICD-10-CM | POA: Diagnosis present

## 2019-10-03 DIAGNOSIS — Z951 Presence of aortocoronary bypass graft: Secondary | ICD-10-CM | POA: Diagnosis not present

## 2019-10-03 DIAGNOSIS — G992 Myelopathy in diseases classified elsewhere: Secondary | ICD-10-CM | POA: Diagnosis not present

## 2019-10-03 DIAGNOSIS — K219 Gastro-esophageal reflux disease without esophagitis: Secondary | ICD-10-CM | POA: Diagnosis present

## 2019-10-03 DIAGNOSIS — I110 Hypertensive heart disease with heart failure: Secondary | ICD-10-CM | POA: Diagnosis present

## 2019-10-03 DIAGNOSIS — G959 Disease of spinal cord, unspecified: Secondary | ICD-10-CM | POA: Diagnosis not present

## 2019-10-03 DIAGNOSIS — I4819 Other persistent atrial fibrillation: Secondary | ICD-10-CM | POA: Diagnosis present

## 2019-10-03 DIAGNOSIS — D72829 Elevated white blood cell count, unspecified: Secondary | ICD-10-CM | POA: Diagnosis present

## 2019-10-03 DIAGNOSIS — G4733 Obstructive sleep apnea (adult) (pediatric): Secondary | ICD-10-CM | POA: Diagnosis not present

## 2019-10-03 DIAGNOSIS — R0602 Shortness of breath: Secondary | ICD-10-CM | POA: Diagnosis not present

## 2019-10-03 DIAGNOSIS — Z96652 Presence of left artificial knee joint: Secondary | ICD-10-CM | POA: Diagnosis present

## 2019-10-03 DIAGNOSIS — F411 Generalized anxiety disorder: Secondary | ICD-10-CM

## 2019-10-03 DIAGNOSIS — I2510 Atherosclerotic heart disease of native coronary artery without angina pectoris: Secondary | ICD-10-CM | POA: Diagnosis present

## 2019-10-03 DIAGNOSIS — I252 Old myocardial infarction: Secondary | ICD-10-CM

## 2019-10-03 DIAGNOSIS — Z6836 Body mass index (BMI) 36.0-36.9, adult: Secondary | ICD-10-CM

## 2019-10-03 DIAGNOSIS — Z4789 Encounter for other orthopedic aftercare: Secondary | ICD-10-CM | POA: Diagnosis not present

## 2019-10-03 DIAGNOSIS — F419 Anxiety disorder, unspecified: Secondary | ICD-10-CM | POA: Diagnosis present

## 2019-10-03 DIAGNOSIS — M4802 Spinal stenosis, cervical region: Secondary | ICD-10-CM

## 2019-10-03 DIAGNOSIS — J986 Disorders of diaphragm: Secondary | ICD-10-CM

## 2019-10-03 DIAGNOSIS — G4700 Insomnia, unspecified: Secondary | ICD-10-CM | POA: Diagnosis present

## 2019-10-03 DIAGNOSIS — I509 Heart failure, unspecified: Secondary | ICD-10-CM

## 2019-10-03 DIAGNOSIS — Z7901 Long term (current) use of anticoagulants: Secondary | ICD-10-CM

## 2019-10-03 DIAGNOSIS — G8250 Quadriplegia, unspecified: Secondary | ICD-10-CM | POA: Diagnosis not present

## 2019-10-03 DIAGNOSIS — E669 Obesity, unspecified: Secondary | ICD-10-CM | POA: Diagnosis present

## 2019-10-03 DIAGNOSIS — K5900 Constipation, unspecified: Secondary | ICD-10-CM

## 2019-10-03 DIAGNOSIS — N400 Enlarged prostate without lower urinary tract symptoms: Secondary | ICD-10-CM | POA: Diagnosis present

## 2019-10-03 LAB — CBC
HCT: 43.8 % (ref 39.0–52.0)
Hemoglobin: 15.1 g/dL (ref 13.0–17.0)
MCH: 29 pg (ref 26.0–34.0)
MCHC: 34.5 g/dL (ref 30.0–36.0)
MCV: 84.1 fL (ref 80.0–100.0)
Platelets: 329 10*3/uL (ref 150–400)
RBC: 5.21 MIL/uL (ref 4.22–5.81)
RDW: 12.6 % (ref 11.5–15.5)
WBC: 28.7 10*3/uL — ABNORMAL HIGH (ref 4.0–10.5)
nRBC: 0 % (ref 0.0–0.2)

## 2019-10-03 LAB — BASIC METABOLIC PANEL
Anion gap: 12 (ref 5–15)
BUN: 40 mg/dL — ABNORMAL HIGH (ref 8–23)
CO2: 26 mmol/L (ref 22–32)
Calcium: 9.2 mg/dL (ref 8.9–10.3)
Chloride: 97 mmol/L — ABNORMAL LOW (ref 98–111)
Creatinine, Ser: 1.31 mg/dL — ABNORMAL HIGH (ref 0.61–1.24)
GFR calc Af Amer: >60 mL/min (ref >60)
GFR calc non Af Amer: 56 mL/min — ABNORMAL LOW (ref >60)
Glucose, Bld: 141 mg/dL — ABNORMAL HIGH (ref 70–99)
Potassium: 3.8 mmol/L (ref 3.5–5.1)
Sodium: 135 mmol/L (ref 135–145)

## 2019-10-03 LAB — HEPARIN LEVEL (UNFRACTIONATED): Heparin Unfractionated: 0.4 IU/mL (ref 0.30–0.70)

## 2019-10-03 LAB — PROCALCITONIN: Procalcitonin: <0.1 ng/mL

## 2019-10-03 MED ORDER — ONDANSETRON HCL 4 MG PO TABS
4.0000 mg | ORAL_TABLET | Freq: Four times a day (QID) | ORAL | Status: DC | PRN
  Administered 2019-10-05: 4 mg via ORAL
  Filled 2019-10-03: qty 1

## 2019-10-03 MED ORDER — CYCLOBENZAPRINE HCL 10 MG PO TABS: 10.0000 mg | ORAL_TABLET | Freq: Three times a day (TID) | ORAL | Status: DC | PRN

## 2019-10-03 MED ORDER — BISACODYL 10 MG RE SUPP
10.0000 mg | Freq: Every day | RECTAL | Status: DC | PRN
  Administered 2019-10-04: 20:00:00 10 mg via RECTAL
  Filled 2019-10-03: qty 1

## 2019-10-03 MED ORDER — ALBUTEROL SULFATE (2.5 MG/3ML) 0.083% IN NEBU
2.5000 mg | INHALATION_SOLUTION | Freq: Four times a day (QID) | RESPIRATORY_TRACT | Status: DC | PRN
  Administered 2019-10-06 – 2019-10-12 (×4): 2.5 mg via RESPIRATORY_TRACT
  Filled 2019-10-03 (×5): qty 3

## 2019-10-03 MED ORDER — SPIRONOLACTONE 25 MG PO TABS
25.0000 mg | ORAL_TABLET | Freq: Every day | ORAL | Status: DC
  Administered 2019-10-04 – 2019-10-17 (×14): 25 mg via ORAL
  Filled 2019-10-03 (×14): qty 1

## 2019-10-03 MED ORDER — PANTOPRAZOLE SODIUM 40 MG PO TBEC
40.0000 mg | DELAYED_RELEASE_TABLET | Freq: Every day | ORAL | Status: DC
  Administered 2019-10-04 – 2019-10-17 (×14): 40 mg via ORAL
  Filled 2019-10-03 (×14): qty 1

## 2019-10-03 MED ORDER — ATORVASTATIN CALCIUM 10 MG PO TABS
20.0000 mg | ORAL_TABLET | Freq: Every day | ORAL | Status: DC
  Administered 2019-10-03 – 2019-10-16 (×14): 20 mg via ORAL
  Filled 2019-10-03 (×15): qty 2

## 2019-10-03 MED ORDER — METOPROLOL TARTRATE 12.5 MG HALF TABLET
12.5000 mg | ORAL_TABLET | Freq: Two times a day (BID) | ORAL | Status: DC
  Administered 2019-10-03 – 2019-10-17 (×27): 12.5 mg via ORAL
  Filled 2019-10-03 (×28): qty 1

## 2019-10-03 MED ORDER — DOCUSATE SODIUM 100 MG PO CAPS: 100.0000 mg | ORAL_CAPSULE | Freq: Two times a day (BID) | ORAL | 0 refills | Status: DC

## 2019-10-03 MED ORDER — APIXABAN 5 MG PO TABS
5.0000 mg | ORAL_TABLET | Freq: Two times a day (BID) | ORAL | Status: DC
  Administered 2019-10-03: 5 mg via ORAL
  Filled 2019-10-03: qty 1

## 2019-10-03 MED ORDER — SERTRALINE HCL 50 MG PO TABS
50.0000 mg | ORAL_TABLET | Freq: Every day | ORAL | Status: DC
  Administered 2019-10-04 – 2019-10-17 (×14): 50 mg via ORAL
  Filled 2019-10-03 (×14): qty 1

## 2019-10-03 MED ORDER — OXYCODONE HCL 10 MG PO TABS: 10.0000 mg | ORAL_TABLET | ORAL | 0 refills | Status: DC | PRN

## 2019-10-03 MED ORDER — ACETAMINOPHEN 650 MG RE SUPP: 650.0000 mg | RECTAL | Status: DC | PRN

## 2019-10-03 MED ORDER — OXYCODONE HCL 5 MG PO TABS
10.0000 mg | ORAL_TABLET | ORAL | Status: DC | PRN
  Administered 2019-10-03 – 2019-10-11 (×5): 10 mg via ORAL
  Filled 2019-10-03 (×6): qty 2

## 2019-10-03 MED ORDER — ONDANSETRON HCL 4 MG/2ML IJ SOLN: 4.0000 mg | Freq: Four times a day (QID) | INTRAMUSCULAR | Status: DC | PRN

## 2019-10-03 MED ORDER — ACETAMINOPHEN 325 MG PO TABS
650.0000 mg | ORAL_TABLET | ORAL | Status: DC | PRN
  Administered 2019-10-12: 15:00:00 650 mg via ORAL
  Filled 2019-10-03 (×2): qty 2

## 2019-10-03 MED ORDER — TAMSULOSIN HCL 0.4 MG PO CAPS
0.8000 mg | ORAL_CAPSULE | Freq: Every day | ORAL | Status: DC
  Administered 2019-10-04 – 2019-10-17 (×14): 0.8 mg via ORAL
  Filled 2019-10-03 (×14): qty 2

## 2019-10-03 MED ORDER — HYDRALAZINE HCL 50 MG PO TABS
75.0000 mg | ORAL_TABLET | Freq: Three times a day (TID) | ORAL | Status: DC
  Administered 2019-10-03 – 2019-10-17 (×41): 75 mg via ORAL
  Filled 2019-10-03 (×41): qty 1

## 2019-10-03 MED ORDER — SORBITOL 70 % SOLN
30.0000 mL | Freq: Every day | Status: DC | PRN
  Administered 2019-10-05: 30 mL via ORAL
  Filled 2019-10-03: qty 30

## 2019-10-03 MED ORDER — SENNOSIDES-DOCUSATE SODIUM 8.6-50 MG PO TABS
1.0000 | ORAL_TABLET | Freq: Two times a day (BID) | ORAL | Status: DC
  Administered 2019-10-03 – 2019-10-17 (×28): 1 via ORAL
  Filled 2019-10-03 (×29): qty 1

## 2019-10-03 MED ORDER — CLONAZEPAM 0.25 MG PO TBDP
0.2500 mg | ORAL_TABLET | Freq: Two times a day (BID) | ORAL | Status: DC
  Administered 2019-10-03 – 2019-10-04 (×3): 0.25 mg via ORAL
  Filled 2019-10-03 (×4): qty 1

## 2019-10-03 MED ORDER — TAMSULOSIN HCL 0.4 MG PO CAPS: 0.8000 mg | ORAL_CAPSULE | Freq: Every day | ORAL | 0 refills | Status: DC

## 2019-10-03 MED ORDER — CYCLOBENZAPRINE HCL 10 MG PO TABS: 10.0000 mg | ORAL_TABLET | Freq: Three times a day (TID) | ORAL | 0 refills | Status: DC | PRN

## 2019-10-03 MED ORDER — CLONAZEPAM 0.25 MG PO TBDP: 0.2500 mg | ORAL_TABLET | Freq: Two times a day (BID) | ORAL | 0 refills | Status: DC

## 2019-10-03 MED ORDER — APIXABAN 5 MG PO TABS
5.0000 mg | ORAL_TABLET | Freq: Two times a day (BID) | ORAL | Status: DC
  Administered 2019-10-03 – 2019-10-17 (×28): 5 mg via ORAL
  Filled 2019-10-03 (×28): qty 1

## 2019-10-03 MED ORDER — DILTIAZEM HCL ER COATED BEADS 180 MG PO CP24
180.0000 mg | ORAL_CAPSULE | Freq: Every day | ORAL | Status: DC
  Administered 2019-10-04 – 2019-10-17 (×14): 180 mg via ORAL
  Filled 2019-10-03 (×14): qty 1

## 2019-10-03 NOTE — Progress Notes (Signed)
Orthopedic Tech Progress Note Patient Details:  [name] [name] 1951-02-19 [id number] RN called requesting a SOFT COLLAR for patient. Switched rooms yesterday and it must have been thrown away.  Ortho Devices Type of Ortho Device: Soft collar Ortho Device/Splint Location: NECK Ortho Device/Splint Interventions: Application, Ordered   Post Interventions Patient Tolerated: Well Instructions Provided: Care of device, Adjustment of device   Janit Pagan 10/03/2019, 12:21 PM

## 2019-10-03 NOTE — H&P (Signed)
Physical Medicine and Rehabilitation Admission H&P     HPI: [name] [name] is a 68 year old right-handed male with history of CAD status post CABG 2004, lumbar laminectomy, left TKA 2018, hypertension, diastolic congestive heart failure, anxiety, atrial fibrillation with ablation August 2019 and maintained on Eliquis, sleep apnea with CPAP.  Per chart review patient lives with spouse.  1 level home one-step to entry.  Presented September 21, 2019 with severe neck pain and quadriparesis as well as gait instability.  X-rays and imaging demonstrated a large herniated disc at C4-5 with severe spinal stenosis, cervical myelopathy and spinal cord signal change.  He had moderate stenosis at C3-4 and 5-6.  Underwent C3-4, 4-5 and C5-6 anterior cervical discectomy decompression, C3-4 4-55-6 interbody arthrodesis with insertion of interbody prosthesis and anterior cervical plating from C3-C6 with globus titanium plate September 21, 2019 per Dr. Lovell Sheehan.  Cervical brace as directed.  Noted on September 23, 2019 rapid response contacted for respiratory distress requiring NRB 15 L saturations 95%.  There was some reported swelling at the surgical site suspect hematoma.  Chest x-ray showed mild pulmonary vascular congestion with possible small right pleural effusion.  He was transferred to ICU follow-up per critical care medicine.  Patient did improve with the use of CPAP however after taking off CPAP the following morning again with increasing shortness of breath received albuterol and Decadron as advised and changed to BiPAP.  Sniff test 10/02/2019 showed no diaphragmatic paralysis.  Patient also remained on intravenous Lopressor as well as Lasix with noted history of hypertension atrial fibrillation and intravenous heparin was initiated and plan is to resume Eliquis as prior to admission.  Patient did not require intubation and improved with ongoing respiratory treatment.  Lower extremity Dopplers completed negative  for DVT.  Noted leukocytosis 28,700 felt to be secondary to steroids which have been tapered to off.  Surgical site follow-up per neurosurgery did not appear to show any signs of infection and follow-up imaging demonstrates prevertebral swelling largely resolved.  Mild AKI with creatinine 1.45 and Lasix has been discontinued.  Patient is tolerating a regular diet.  Therapy evaluations completed and patient was admitted for a comprehensive rehab program.  LBM was Sunday- and was on bedpan and could control when he went, however had 2-3 incontinent BMs prior to then. Also has condom cath per pt- however was cathed prior to then for urinary retention.  According to wife, pt is worrier- has had difficulties x 1 year with weakness- thought had a stroke, and just kept trying to compensate, and was falling at home- never hit his head.   Reports numbness "is going away", but has still some in LUE mainly; also took a deep breath a few days ago and "hurt his R rib area" so painful when takes deep breath now.  Very Hard of hearing  Review of Systems  Constitutional: Negative for chills and fever.  HENT: Negative for hearing loss.   Eyes: Negative for blurred vision and double vision.  Respiratory: Negative for cough.        Dyspnea on exertion  Cardiovascular: Positive for palpitations and leg swelling. Negative for chest pain.  Gastrointestinal: Positive for constipation. Negative for heartburn, nausea and vomiting.       GERD  Genitourinary: Negative for dysuria, flank pain and hematuria.  Musculoskeletal: Positive for joint pain, myalgias and neck pain.  Skin: Negative for rash.  Neurological: Positive for weakness.  Psychiatric/Behavioral: The patient has insomnia.  Anxiety  All other systems reviewed and are negative.  Past Medical History:  Diagnosis Date   Anxiety    CHF (congestive heart failure) ([place])    h/o due to fluid overload in recovery room   Coronary artery disease     2v CABG, 2004 ([facility])   COVID-19    DJD (degenerative joint disease)    Dyspnea    on exertion   GERD (gastroesophageal reflux disease)    History of kidney stones    3 stones yrs. ago   HLD (hyperlipidemia)    Hypertension    MI, old 2004   OA (osteoarthritis)    Obesity    Persistent atrial fibrillation (HCC)    failed medical therapy with sotalol, s/p PVI x 2   Sleep apnea    on CPAP   Spinal stenosis    Typical atrial flutter [facility])    Past Surgical History:  Procedure Laterality Date   ANTERIOR CERVICAL DECOMP/DISCECTOMY FUSION [name] 09/21/2019   Procedure: ANTERIOR CERVICAL DECOMPRESSION/DISCECTOMY FUSION CERVICAL THREE- CERVICAL FOUR, CERVICAL FOUR- CERVICAL FIVE, CERVICAL FIVE- CERVICAL SIX;  Surgeon: Newman Pies, MD;  Location: [facility];  Service: Neurosurgery;  Laterality: [name];  ANTERIOR CERVICAL DECOMPRESSION/DISCECTOMY FUSION CERVICAL THREE- CERVICAL FOUR, CERVICAL FOUR- CERVICAL FIVE, CERVICAL FIVE- CERVICAL SIX   ATRIAL FIBRILLATION ABLATION [name] 05/24/2018   Procedure: ATRIAL FIBRILLATION ABLATION;  Surgeon: Thompson Grayer, MD;  Location: [place] [facility];  Service: Cardiovascular;  Laterality: [name];   BACK SURGERY  2011   CARPAL TUNNEL RELEASE     bilateral   CHOLECYSTECTOMY     CORONARY ARTERY BYPASS GRAFT     2004   ELECTROPHYSIOLOGIC STUDY [name] 09/15/2016   Procedure: Atrial Fibrillation Ablation;  Surgeon: Thompson Grayer, MD;  Location: [place] [facility];  Service: Cardiovascular;  Laterality: [name];   fistula surgery     hemorrhoidectomy     LEFT HEART CATH AND CORS/GRAFTS ANGIOGRAPHY [name] 02/07/2018   Procedure: LEFT HEART CATH AND CORS/GRAFTS ANGIOGRAPHY;  Surgeon: Martinique, Peter M, MD;  Location: [place] [facility];  Service: Cardiovascular;  Laterality: [name];   LUMBAR LAMINECTOMY     right foot fracture     TEE WITHOUT CARDIOVERSION [name] 09/15/2016   Procedure: TRANSESOPHAGEAL ECHOCARDIOGRAM (TEE);  Surgeon: Lelon Perla, MD;   Location: [facility];  Service: Cardiovascular;  Laterality: [name];   TOTAL KNEE ARTHROPLASTY Left 07/07/2017   Procedure: LEFT TOTAL KNEE ARTHROPLASTY;  Surgeon: Latanya Maudlin, MD;  Location: [facility];  Service: Orthopedics;  Laterality: Left;   TRANSTHORACIC ECHOCARDIOGRAM  08/25/2010   EF 55-60%   Family History  Problem Relation Age of Onset   Breast cancer Mother    Hypertension Mother    Coronary artery disease Father    Stroke Father    Hypertension Father    Social History:  reports that he has never smoked. He has never used smokeless tobacco. He reports current alcohol use. He reports that he does not use drugs. Allergies:  Allergies  Allergen Reactions   Vancomycin Anaphylaxis, Shortness Of Breath and Other (See Comments)    Was informed after heart surgery Breathing problem patient unsure   Aldactone [Spironolactone]     GYNECOMASTIA    Chlorthalidone Other (See Comments)    GOUT   Nsaids Other (See Comments)    On blood thinner   Medications Prior to Admission  Medication Sig Dispense Refill   acetaminophen (TYLENOL) 500 MG tablet Take 1,000 mg by mouth every 6 (six) hours as needed for  moderate pain.     apixaban (ELIQUIS) 5 MG TABS tablet Take 1 tablet (5 mg total) by mouth 2 (two) times daily. 42 tablet 0   atorvastatin (LIPITOR) 20 MG tablet TAKE 1 TABLET BY MOUTH EVERY DAY 90 tablet 1   Coenzyme Q10 (CO Q 10 PO) Take 400 mg by mouth daily.      diltiazem (CARDIZEM CD) 180 MG 24 hr capsule Take 1 capsule (180 mg total) by mouth daily. 90 capsule 3   eplerenone (INSPRA) 25 MG tablet TAKE 1 TABLET BY MOUTH TWICE A DAY (Patient taking differently: Take 25 mg by mouth 2 (two) times daily. ) 180 tablet 2   furosemide (LASIX) 20 MG tablet TAKE 1 TABLET BY MOUTH DAILY AS NEEDED FOR SWELLING/WEIGHT GAIN 90 tablet 1   hydrALAZINE (APRESOLINE) 50 MG tablet Take 1.5 tablets (75 mg total) by mouth 3 (three) times daily. 405 tablet 1   irbesartan (AVAPRO)  300 MG tablet Take 1 tablet (300 mg total) by mouth daily. (Patient taking differently: Take 300 mg by mouth at bedtime. ) 90 tablet 3   LORazepam (ATIVAN) 0.5 MG tablet Take 1 tablet (0.5 mg total) by mouth 3 (three) times daily. 12 tablet 0   metoprolol tartrate (LOPRESSOR) 25 MG tablet TAKE 1 TABLET (25 MG TOTAL) BY MOUTH EVERY 8 (EIGHT) HOURS AS NEEDED (A-FIB). (Patient taking differently: Take 25 mg by mouth daily. ) 270 tablet 1   Multiple Vitamin (MULTIVITAMIN) tablet Take 1 tablet by mouth daily.     omeprazole (PRILOSEC) 20 MG capsule TAKE 1 CAPSULE BY MOUTH EVERY OTHER DAY. (Patient taking differently: Take 20 mg by mouth every other day. ) 45 capsule 3   PRESCRIPTION MEDICATION Pt uses CPAP machine at bedtime     sertraline (ZOLOFT) 50 MG tablet Take 50 mg by mouth daily.     nitroGLYCERIN (NITROSTAT) 0.4 MG SL tablet Place 1 tablet (0.4 mg total) under the tongue every 5 (five) minutes x 3 doses as needed for chest pain. (Patient not taking: Reported on 09/13/2019) 25 tablet 1    Drug Regimen Review  Drug regimen was reviewed and remains appropriate with no significant issues identified  Home: Home Living Family/patient expects to be discharged to:: Private residence Living Arrangements: Spouse/significant other Available Help at Discharge: Family Type of Home: House Home Access: Stairs to enter Secretary/administratorntrance Stairs-Number of Steps: 1 Home Layout: One level Bathroom Shower/Tub: Health visitorWalk-in shower Bathroom Toilet: Handicapped height Home Equipment: Environmental consultantWalker - 2 wheels, Cane - single point   Functional History: Prior Function Level of Independence: Needs assistance Gait / Transfers Assistance Needed: Increased time ADL's / Homemaking Assistance Needed: Pt requiring assist as LUE limited due to previous injury and RUE limited by new weakness and decreased coordination.   Functional Status:  Mobility: Bed Mobility Overal bed mobility: Needs Assistance Bed Mobility: Supine to  Sit, Sit to Sidelying, Sit to Supine Rolling: Min assist Sidelying to sit: Min assist Sit to supine: Mod assist, +2 for safety/equipment, +2 for physical assistance, HOB elevated Sit to sidelying: Max assist, +2 for physical assistance General bed mobility comments: MinA with use of rail for and trunk elevation; sitting EOB to supine, assist with BLEs. Transfers Overall transfer level: Needs assistance Equipment used: Rolling walker (2 wheeled) Transfers: Sit to/from Stand, Stand Pivot Transfers Sit to Stand: Min assist, Mod assist, +2 physical assistance, +2 safety/equipment Stand pivot transfers: Min assist, Mod assist, +2 physical assistance, +2 safety/equipment, From elevated surface General transfer comment: Pt able  to stand with RW for pivot and for ambulation with assist for steadying Ambulation/Gait Ambulation/Gait assistance: Min assist, +2 safety/equipment Gait Distance (Feet): 20 Feet(10+10 to and from bathroom) Assistive device: Rolling walker (2 wheeled) Gait Pattern/deviations: Step-through pattern, Decreased stride length, Drifts right/left, Trunk flexed General Gait Details: Min assist for steadying, pt with 2 LOB during initial steps after standing requiring mod steadying assist at gait belt to correct. Verbal cuing for placement in RW, upright posture. Gait velocity: decr Gait velocity interpretation: <1.8 ft/sec, indicate of risk for recurrent falls    ADL: ADL Overall ADL's : Needs assistance/impaired Eating/Feeding: Set up, Sitting Grooming: Minimal assistance, Sitting, Standing Grooming Details (indicate cue type and reason): Assist for weakness in BUEs Upper Body Bathing: Moderate assistance, Sitting Lower Body Bathing: Maximal assistance, Cueing for safety Upper Body Dressing : Moderate assistance, Sitting Upper Body Dressing Details (indicate cue type and reason): Assist as arms are limited by poor coordination and weakness Lower Body Dressing: Maximal  assistance, Sitting/lateral leans, Sit to/from stand Toilet Transfer: Minimal assistance, +2 for physical assistance, +2 for safety/equipment, Ambulation, BSC, RW Toileting- Clothing Manipulation and Hygiene: Moderate assistance, Sitting/lateral lean, Sit to/from stand, Adhering to back precautions Functional mobility during ADLs: Minimal assistance, +2 for physical assistance, +2 for safety/equipment, Rolling walker, Cueing for safety General ADL Comments: Pt education on back precautions and reducing lean at sink.  Cognition: Cognition Overall Cognitive Status: Within Functional Limits for tasks assessed Orientation Level: Oriented X4 Cognition Arousal/Alertness: Awake/alert Behavior During Therapy: WFL for tasks assessed/performed Overall Cognitive Status: Within Functional Limits for tasks assessed General Comments: Pt asking for Ativan s/p PT/OT session. RN aware.  Physical Exam: Blood pressure 120/67, pulse 64, temperature 98.7 F (37.1 C), temperature source Axillary, resp. rate (!) 24, height 5\' 6"  (1.676 m), weight 109.3 kg, SpO2 96 %. Physical Exam  Nursing note and vitals reviewed. Constitutional: He is oriented to person, place, and time.  Obese, barrel chested male on bed; supine; wife in room, On O2 high flow by [place]- 5L, NAD  HENT:  Head: Atraumatic.  Mouth/Throat: Oropharynx is clear and moist. No oropharyngeal exudate.  Eyes: Pupils are equal, round, and reactive to light. EOM are normal. No scleral icterus.  Neck:  Incision on anterior neck- hard to see due to body habitus, but appears to be glued- a lot of bruising and ecchymosis  Cardiovascular: Regular rhythm.  RRR  Respiratory: Effort normal and breath sounds normal. No respiratory distress. He has no wheezes.  Quiet breath sounds- CTA B/L  GI:  Protuberant, ND per pt, NT, (+) hypoactive BS  Genitourinary:    Genitourinary Comments: Condom cath   Musculoskeletal:     Comments: RUE bicep 4+/5, WE 4+/5, triceps  4+/5, grip 4+/5, and finger abd 4+/5 LUE- biceps 3+/5, WE 3+/5, triceps 3/5, grip 3/5, finger abd 3-/5 RLE- HF 5-/5, KE 4+/5, DF 5-/5, PF 5-/5, EHL 4+/5 LLE- HF 4-/5, KE 4/5, DF 4-/5, PF 4/5, EHL 4-/5  Neurological: He is alert and oriented to person, place, and time. No cranial nerve deficit.  Patient is alert in no acute distress interactive with conversation.  Follows full commands.  Oriented x3. Hoffman's B/L in UEs- no clonus B/L Decreased sensation to light touch L C5-T1 otherwise intact to light touch in all 3 other extremities and torso B/L   Skin:  Surgical site neck hematoma resolving.  Noted ecchymosis at site. 2 IVs in RUE- no signs of infiltration/etc  Psychiatric:  anxious    Results for  orders placed or performed during the hospital encounter of 09/21/19 (from the past 48 hour(s))  CBC     Status: Abnormal   Collection Time: 10/02/19  6:47 AM  Result Value Ref Range   WBC 28.9 (H) 4.0 - 10.5 K/uL   RBC 5.58 4.22 - 5.81 MIL/uL   Hemoglobin 16.5 13.0 - 17.0 g/dL   HCT 16.1 09.6 - 04.5 %   MCV 83.7 80.0 - 100.0 fL   MCH 29.6 26.0 - 34.0 pg   MCHC 35.3 30.0 - 36.0 g/dL   RDW 40.9 81.1 - 91.4 %   Platelets 305 150 - 400 K/uL   nRBC 0.0 0.0 - 0.2 %    Comment: Performed at [facility], [address], [place], [place] [address]  Heparin level (unfractionated)     Status: None   Collection Time: 10/02/19  6:47 AM  Result Value Ref Range   Heparin Unfractionated 0.55 0.30 - 0.70 IU/mL    Comment: (NOTE) If heparin results are below expected values, and patient dosage has  been confirmed, suggest follow up testing of antithrombin III levels. Performed at [facility], [address], [place], [place] [address]   BMET AM     Status: Abnormal   Collection Time: 10/02/19  6:47 AM  Result Value Ref Range   Sodium 135 135 - 145 mmol/L   Potassium 4.1 3.5 - 5.1 mmol/L   Chloride 92 (L) 98 - 111 mmol/L   CO2 30 22 - 32 mmol/L   Glucose, Bld 132 (H) 70 - 99  mg/dL   BUN 36 (H) 8 - 23 mg/dL   Creatinine, Ser 8.65 (H) 0.61 - 1.24 mg/dL   Calcium 9.5 8.9 - 78.4 mg/dL   GFR calc non Af Amer 50 (L) >60 mL/min   GFR calc Af Amer 58 (L) >60 mL/min   Anion gap 13 5 - 15    Comment: Performed at [facility], [address], [place], [place] [address]  MAG AM     Status: None   Collection Time: 10/02/19  6:47 AM  Result Value Ref Range   Magnesium 2.3 1.7 - 2.4 mg/dL    Comment: Performed at [facility], [address] [place], [place] [address]  Phosphorus AM     Status: Abnormal   Collection Time: 10/02/19  6:47 AM  Result Value Ref Range   Phosphorus 4.8 (H) 2.5 - 4.6 mg/dL    Comment: Performed at [facility], [address], [place], [place] [address]  Procalcitonin - Baseline     Status: None   Collection Time: 10/02/19  6:47 AM  Result Value Ref Range   Procalcitonin <0.10 ng/mL    Comment:        Interpretation: PCT (Procalcitonin) <= 0.5 ng/mL: Systemic infection (sepsis) is not likely. Local bacterial infection is possible. (NOTE)       Sepsis PCT Algorithm           Lower Respiratory Tract                                      Infection PCT Algorithm    ----------------------------     ----------------------------         PCT < 0.25 ng/mL                PCT < 0.10 ng/mL  Strongly encourage             Strongly discourage   discontinuation of antibiotics    initiation of antibiotics    ----------------------------     -----------------------------       PCT 0.25 - 0.50 ng/mL            PCT 0.10 - 0.25 ng/mL               OR       >80% decrease in PCT            Discourage initiation of                                            antibiotics      Encourage discontinuation           of antibiotics    ----------------------------     -----------------------------         PCT >= 0.50 ng/mL              PCT 0.26 - 0.50 ng/mL               AND        <80% decrease in PCT             Encourage initiation  of                                             antibiotics       Encourage continuation           of antibiotics    ----------------------------     -----------------------------        PCT >= 0.50 ng/mL                  PCT > 0.50 ng/mL               AND         increase in PCT                  Strongly encourage                                      initiation of antibiotics    Strongly encourage escalation           of antibiotics                                     -----------------------------                                           PCT <= 0.25 ng/mL                                                 OR                                        >  80% decrease in PCT                                     Discontinue / Do not initiate                                             antibiotics Performed at [facility], [address], [place], [place] [address]    DG Sniff Test  Result Date: 10/02/2019 CLINICAL DATA:  Acute respiratory failure EXAM: CHEST FLUOROSCOPY TECHNIQUE: Real-time fluoroscopic evaluation of the chest was performed. FLUOROSCOPY TIME:  Fluoroscopy Time:  1 minutes Radiation Exposure Index (if provided by the fluoroscopic device): 3.7 mGy Number of Acquired Spot Images: 0 COMPARISON:  None other than chest x-ray evaluations and previous CTs. FINDINGS: Limited motion of the left and right hemidiaphragm with maximal inspiratory effort. Lung volumes remain low. Changes of median sternotomy and CABG. IMPRESSION: Limited motion with low volume chest of the right and left hemidiaphragm. No signs of paradoxical motion to suggest diaphragmatic paralysis. Electronically Signed   By: Donzetta Kohut M.D.   On: 10/02/2019 13:53       Medical Problem List and Plan: 1.   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  Soft Cervical collar when OOB  -patient may shower if cover cervical incision  -ELOS/Goals: 16-20 days/Goals Supervision- CGA 2.  Antithrombotics: -DVT/anticoagulation: Resume Eliquis 5 mg twice daily as prior to admission; off heparin gtt  -antiplatelet therapy: [name] 3. Pain Management: Flexeril and oxycodone as needed 4. Mood: Zoloft 50 mg daily, Klonopin 0.25 mg twice daily- will check if was changed from dose at home;   -antipsychotic agents: [name] 5. Neuropsych: This patient is capable of making decisions on his own behalf. 6. Skin/Wound Care: Routine skin checks 7. Fluids/Electrolytes/Nutrition: Routine in and outs with follow-up chemistries 8.  History of atrial fibrillation.  Patient transitioned from intravenous heparin back to chronic Eliquis.  Cardiac rate controlled.  Continue Cardizem 100 mg daily, Lopressor 12.5 mg twice daily 9.  Hypertension.  Aldactone 25 mg daily,  hydralazine 75 mg 3 times daily.  Monitor with increased mobility 10.  History of CAD with CABG 2004.  No complaints of increasing chest pain. 11.  Leukocytosis.  Felt to be steroid induced.  Prednisone taper completed.  Follow-up CBC 12.  Diastolic congestive heart failure.  Monitor for any signs of fluid overload 13.  OSA.  Currently on BiPAP. 14.  Hyperlipidemia.  Lipitor 15.  BPH and neurogenic bladder.  Flomax 0.8 mg daily.  Check PVRs for 3 days and cath if necessary 16.  Constipation/Neurogenic bowel.  Laxative assistance; will clean out and see if has control of stool and if not, will put on bowel program.  17.  GERD.  Protonix 18. Anxiety- Will make sure on home doses of Benzo.     Mcarthur Rossetti Angiulli, PA-C 10/03/2019    I have personally performed a face to face diagnostic evaluation of this patient and formulated the key components of the plan.  Additionally, I have personally reviewed laboratory data, imaging studies, as well as relevant notes and concur with the physician  assistant's documentation above.   The  patient's status has not changed from the original H&P.  Any changes in documentation from the acute care chart have been noted above.

## 2019-10-03 NOTE — Care Management Important Message (Signed)
Important Message  Patient Details  Name: [name] [name] MRN: [id number] Date of Birth: 11-14-1950   Medicare Important Message Given:  Yes     Memory Argue 10/03/2019, 12:28 PM

## 2019-10-03 NOTE — Progress Notes (Signed)
Subjective: The patient is alert and pleasant.  He looks and feels better.  Objective: Vital signs in last 24 hours: Temp:  [97.9 F (36.6 C)-98.7 F (37.1 C)] 97.9 F (36.6 C) (12/29 0721) Pulse Rate:  [53-109] 91 (12/29 0721) Resp:  [17-28] 22 (12/29 0721) BP: (92-155)/(40-95) 122/74 (12/29 0721) SpO2:  [94 %-100 %] 97 % (12/29 0721) Estimated body mass index is 38.9 kg/m as calculated from the following:   Height as of this encounter: 5\' 6"  (1.676 m).   Weight as of this encounter: 109.3 kg.   Intake/Output from previous day: 12/28 0701 - 12/29 0700 In: 709.9 [P.O.:600; I.V.:109.9] Out: 300 [Urine:300] Intake/Output this shift: No intake/output data recorded.  Physical exam the patient is alert and oriented.  His wound is healing well.  His strength is normal except he continues to have mild weakness in his bilateral deltoids which is improving.  Lab Results: Recent Labs    10/02/19 0647 10/03/19 0537  WBC 28.9* 28.7*  HGB 16.5 15.1  HCT 46.7 43.8  PLT 305 329   BMET Recent Labs    10/02/19 0647 10/03/19 0537  NA 135 135  K 4.1 3.8  CL 92* 97*  CO2 30 26  GLUCOSE 132* 141*  BUN 36* 40*  CREATININE 1.45* 1.31*  CALCIUM 9.5 9.2    Studies/Results: DG Sniff Test  Result Date: 10/02/2019 CLINICAL DATA:  Acute respiratory failure EXAM: CHEST FLUOROSCOPY TECHNIQUE: Real-time fluoroscopic evaluation of the chest was performed. FLUOROSCOPY TIME:  Fluoroscopy Time:  1 minutes Radiation Exposure Index (if provided by the fluoroscopic device): 3.7 mGy Number of Acquired Spot Images: 0 COMPARISON:  None other than chest x-ray evaluations and previous CTs. FINDINGS: Limited motion of the left and right hemidiaphragm with maximal inspiratory effort. Lung volumes remain low. Changes of median sternotomy and CABG. IMPRESSION: Limited motion with low volume chest of the right and left hemidiaphragm. No signs of paradoxical motion to suggest diaphragmatic paralysis.  Electronically Signed   By: Zetta Bills M.D.   On: 10/02/2019 13:53   DG CHEST PORT 1 VIEW  Result Date: 10/03/2019 CLINICAL DATA:  Acute respiratory failure. EXAM: PORTABLE CHEST 1 VIEW COMPARISON:  Radiograph 09/30/2019. FINDINGS: Persistent low lung volumes. Post median sternotomy. Bibasilar opacities with blunting of the costophrenic angles, unchanged from prior. No pulmonary edema or pneumothorax. Surgical hardware in the lower cervical spine is partially included. IMPRESSION: No significant change from prior exam with low lung volumes. Bibasilar opacities likely combination of atelectasis and small pleural effusions. Electronically Signed   By: Keith Rake M.D.   On: 10/03/2019 06:23    Assessment/Plan: Postop day #12: I think the patient would benefit from inpatient rehab to optimize his condition/cervical myelopathy.  We are awaiting placement.  A. fib/Eliquis: I will DC the heparin and ask pharmacy to restart his Eliquis.  LOS: 12 days     [name] [name] 10/03/2019, 7:56 AM

## 2019-10-03 NOTE — Progress Notes (Signed)
Inpatient Rehabilitation Admissions Coordinator  I contacted Dr. Arnoldo Morale and he clarified that pt is medically ready for discharge to CIR today. RN CM, Almyra Free , made aware. I will make the arrnagements to admit today.   Danne Baxter, RN, MSN Rehab Admissions Coordinator [phone] 10/03/2019 9:35 AM

## 2019-10-03 NOTE — Progress Notes (Signed)
NAME:  [name] [name], MRN:  [id number], DOB:  1951-02-27, LOS: 12 ADMISSION DATE:  09/21/2019, CONSULTATION DATE:  12/27 REFERRING MD:  Dr. Tilda Franco, CHIEF COMPLAINT:  Respiratory distress    Brief History   68 year old man with OSA, hypertension, atrial fibrillation underwent multilevel ACDF on 12/17 and developed acute respiratory distress 12/18 requiring transfer to ICU.  Transferred to Prog 12/28  Past Medical History  Persistent atrial fibrillation Spinal stenosis Sleep apnea Obesity Osteoarthritis MI in 2004 Hypertension Hyperlipidemia GERD Congestive heart failure Anxiety  Significant Hospital Events   12/17 admitted 12/19 transferred to ICU for respiratory distress  12/21 tolerating BiPAP more so than HFNC   Consults:  PCCM  Procedures:  Cervical spine x-ray > Intraoperative localization for C3-C6 ACDF.  CXR 12/21 > Low lung volumes. Cardiomegaly.  atelectasis, elevated R hemidiaphragm.   12/21 BLE Venous doppler > neg for DVT   Significant Diagnostic Tests:    Micro Data:    Antimicrobials:    Interim history/subjective:  + 400 ml IO, net negative 13 L this admission Afebrile Bipap overnight, HFNC 7 L Hemodynamics stable Patient alert and oriented, denies any complaints of pain.  Denies dyspnea.  [place] able to be weaned to 3 L [place], SpO2 > 95% with talking.   Objective   Blood pressure 122/74, pulse 95, temperature 97.9 F (36.6 C), resp. rate (!) 22, height 5\' 6"  (1.676 m), weight 109.3 kg, SpO2 97 %.        Intake/Output Summary (Last 24 hours) at 10/03/2019 1113 Last data filed at 10/02/2019 2000 Gross per 24 hour  Intake 479.13 ml  Output 300 ml  Net 179.13 ml   Filed Weights   09/21/19 0809  Weight: 109.3 kg    Examination: General: Adult male sitting in bed HEENT: [place]/AT, hematoma to anterior neck at surgical site, MM pink/moist, PERRL,  Neuro: Alert and oriented x3, able to follow all commands, non-focal  CV:  s1s2 regular rate and  rhythm, no murmur, rubs, or gallops, warm and well perfused PULM:  Clear to ascultation bilaterally, no increased work of breathing  GI: soft, bowel sounds active in all 4 quadrants, non-tender, non-distended Extremities: warm/dry, no edema  Skin: no rashes or lesions   Resolved Hospital Problem list     Assessment & Plan:  Acute hypoxic respiratory failure Severe OSA -Suspect multifactorial in setting post op swelling and hematoma s/p ACDF likely c/b mild volume overload. Hematoma and volume overload appear improved but increased oxygen demands and bipap needs continue.  -Question diaphragmatic injury v PE (no DVT on BLE dopplers).   Concern here is that patient gets so agitated and puts himself in negative pressure pulmonary edema Plan: Pulmonary Hygiene: Mobilize as tolerated, IS Continue to wean supplemental O2 for SpO2> 90% BiPAP qHS SNIF test: Limited motion of the left and right hemidiaphragm with maximal inspiratory effort. No evidence of diaphragmatic paralysis Wife at bedside, reports patient has had increasing dyspnea at baseline prior to admission.  Recommend ECHO and pulm follow up.   Hypertension Atrial fibrillation  Plan: Gentle diuresis resumed 12/27 Heparin drip for anticoagulation >> Transitioning to Eliquis  Leukocytosis -likely r/t steroids; afebrile Plan: -Stable  Best practice:  Diet: regular Pain/Anxiety/Delirium protocol (if indicated): PRNs VAP protocol (if indicated): [name] DVT prophylaxis: IV heparin drip  GI prophylaxis: PPI Glucose control: monitor  Mobility: up with assistance  Code Status: Full Family Communication: Per neurology  Disposition: Transfer to CIR 1200 per neurosurgery .    Labs  CBC: Recent Labs  Lab 09/29/19 0450 09/30/19 0550 10/01/19 0233 10/02/19 0647 10/03/19 0537  WBC 15.1* 18.0* 22.1* 28.9* 28.7*  HGB 14.8 15.4 16.6 16.5 15.1  HCT 43.6 45.0 47.4 46.7 43.8  MCV 86.2 84.6 83.6 83.7 84.1  PLT 270 290 315 305  371    Basic Metabolic Panel: Recent Labs  Lab 09/28/19 0128 09/29/19 0450 09/30/19 0550 10/01/19 0233 10/02/19 0647 10/03/19 0537  NA 142 141 140 137 135 135  K 3.6 3.4* 3.5 4.5 4.1 3.8  CL 107 102 104 98 92* 97*  CO2 28 27 25 25 30 26   GLUCOSE 144* 121* 120* 137* 132* 141*  BUN 46* 37* 26* 27* 36* 40*  CREATININE 0.96 1.07 1.21 1.33* 1.45* 1.31*  CALCIUM 8.9 9.0 8.8* 9.5 9.5 9.2  MG 2.7* 2.4 2.3 2.4 2.3  --   PHOS 3.1 2.8 3.1 3.9 4.8*  --    GFR: Estimated Creatinine Clearance: 64.3 mL/min (A) (by C-G formula based on SCr of 1.31 mg/dL (H)). Recent Labs  Lab 09/30/19 0550 09/30/19 1213 10/01/19 0233 10/02/19 0647 10/03/19 0537  PROCALCITON  --  <0.10  --  <0.10 <0.10  WBC 18.0*  --  22.1* 28.9* 28.7*    Liver Function Tests: No results for input(s): AST, ALT, ALKPHOS, BILITOT, PROT, ALBUMIN in the last 168 hours. No results for input(s): LIPASE, AMYLASE in the last 168 hours. No results for input(s): AMMONIA in the last 168 hours.  ABG    Component Value Date/Time   PHART 7.474 (H) 09/27/2019 0008   PCO2ART 42.6 09/27/2019 0008   PO2ART 81.0 (L) 09/27/2019 0008   HCO3 31.4 (H) 09/27/2019 0008   TCO2 33 (H) 09/27/2019 0008   O2SAT 97.0 09/27/2019 0008     Coagulation Profile: No results for input(s): INR, PROTIME in the last 168 hours.  Cardiac Enzymes: No results for input(s): CKTOTAL, CKMB, CKMBINDEX, TROPONINI in the last 168 hours.  HbA1C: Hgb A1c MFr Bld  Date/Time Value Ref Range Status  03/28/2015 05:17 AM 6.2 (H) 4.8 - 5.6 % Final    Comment:    (NOTE)         Pre-diabetes: 5.7 - 6.4         Diabetes: >6.4         Glycemic control for adults with diabetes: <7.0   07/25/2014 10:16 AM 6.0 (H) <5.7 % Final    Comment:                                                                           According to the ADA Clinical Practice Recommendations for 2011, when HbA1c is used as a screening test:     >=6.5%   Diagnostic of Diabetes  [place]            (if abnormal result is confirmed)   5.7-6.4%   Increased risk of developing Diabetes Mellitus   References:Diagnosis and Classification of Diabetes Mellitus,Diabetes IRCV,8938,10(FBPZW 1):S62-S69 and Standards of Medical Care in         Diabetes - 2011,Diabetes Care,2011,34 (Suppl 1):S11-S61.      CBG: No results for input(s): GLUCAP in the last 168 hours.     Critical care time:  Paulita Fujita, ACNP [facility]  After hours pager: [phone]

## 2019-10-03 NOTE — Discharge Instructions (Signed)

## 2019-10-03 NOTE — Progress Notes (Signed)
ANTICOAGULATION CONSULT NOTE - Follow Up Consult  Pharmacy Consult:  Heparin >> Eliquis Indication: atrial fibrillation  Allergies  Allergen Reactions  . Vancomycin Anaphylaxis, Shortness Of Breath and Other (See Comments)    Was informed after heart surgery Breathing problem patient unsure  . Aldactone [Spironolactone]     GYNECOMASTIA   . Chlorthalidone Other (See Comments)    GOUT  . Nsaids Other (See Comments)    On blood thinner    Patient Measurements: Height: 5\' 6"  (167.6 cm) Weight: 241 lb (109.3 kg) IBW/kg (Calculated) : 63.8  Heparin Dosing Weight: 89 kg  Vital Signs: Temp: 97.9 F (36.6 C) (12/29 0721) Temp Source: Oral (12/29 0400) BP: 122/74 (12/29 0721) Pulse Rate: 95 (12/29 0819)  Labs: Recent Labs    10/01/19 0233 10/02/19 0647 10/03/19 0537  HGB 16.6 16.5 15.1  HCT 47.4 46.7 43.8  PLT 315 305 329  HEPARINUNFRC 0.38 0.55 0.40  CREATININE 1.33* 1.45* 1.31*    Estimated Creatinine Clearance: 64.3 mL/min (A) (by C-G formula based on SCr of 1.31 mg/dL (H)).  Assessment: 68 yr old male presenting for neurosurgery - on apixaban pta for afib.  Pharmacy consulted to dose IV heparin while apixaban is on hold.  Now to resume Eliquis.  Prevertebral hematoma improving. Xray 12/26 - mild soft tissue swelling improved from xray on 12/19. No overt bleeding reported.  Goal of Therapy:  Appropriate anticoagulation   Plan:  Stop IV heparin when Eliquis is administered Eliquis 5mg  PO BID Pharmacy will sign off and follow peripherally.  Thank you for the consult!  Christain Mcraney D. Mina Marble, PharmD, BCPS, [place] 10/03/2019, 9:29 AM

## 2019-10-03 NOTE — Discharge Summary (Signed)
Physician Discharge Summary    Providing Compassionate, Quality Care - Together   Patient ID: [name] [name] MRN: [id number] DOB/AGE: 02/15/1951 68 y.o.  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 underwent a a C3-4, C4-5, and C5-6 ACDF by Dr. Arnoldo Morale on 09/21/2019. He initially did well post-operatively and was ambulating in the hallways. He developed some urinary retention on 09/22/2019 and a catheter was placed. He has had urological issues in the past. Flomax was started and Mr. [name] is able to urinate without issue now. On 09/23/2019, he went into respiratory distress and was transferred to the ICU to be placed on BiPAP. CCM was consulted for airway management. He developed lower extremity swelling. Lower extremity venous dopplers were negative for DVT on 09/25/2019. He had a hematoma at his surgical site that resolved without intervention. His Eliquis was restarted on 10/03/2019. He has worked with therapies who are recommending inpatient rehab. He is ready for discharge to CIR.  Consults: pulmonary/intensive care and rehabilitation medicine  Significant Diagnostic Studies: radiology: DG Neck Soft Tissue  Result Date: 09/30/2019 CLINICAL DATA:  Shortness of breath and neck swelling. EXAM: NECK SOFT TISSUES - 1+ VIEW COMPARISON:  09/23/2019 FINDINGS: Nasopharynx, oropharynx, pharynx and hypopharyngeal region are normal. Prevertebral soft tissues are normal. Epiglottis and subglottic airway are normal. Anterior fusion hardware intact unchanged over the cervical spine remainder the exam is unchanged. IMPRESSION: No acute findings. Electronically Signed   By: Marin Olp M.D.   On: 09/30/2019 10:44   DG  Neck Soft Tissue  Result Date: 09/23/2019 CLINICAL DATA:  Neck swelling and difficulty breathing. Recent ACDF 2 days ago. EXAM: NECK SOFT TISSUES - 1+ VIEW COMPARISON:  Cervical spine x-rays dated September 21, 2019 FINDINGS: C3-C6 ACDF. Prominent prevertebral soft tissue swelling, increased when compared to intraoperative x-rays from 2 days ago. There is some mass effect on the hypopharynx. No epiglottic enlargement. IMPRESSION: Prominent prevertebral soft tissue swelling, increased when compared to intraoperative x-rays from 2 days ago. Electronically Signed   By: Titus Dubin M.D.   On: 09/23/2019 09:11   DG Cervical Spine 2-3 Views  Result Date: 09/21/2019 CLINICAL DATA:  C3-C6 ACDF. EXAM: CERVICAL SPINE - 2-3 VIEW COMPARISON:  MRI cervical spine dated September 02, 2019. FINDINGS: Two lateral intraoperative x-rays submitted for review. Film #1 demonstrates surgical localizer at C3-C4. Film #2 demonstrates completion C3-C6 ACDF. IMPRESSION: Intraoperative localization for C3-C6 ACDF. Electronically Signed   By: Titus Dubin M.D.   On: 09/21/2019 14:17   DG Sniff Test  Result Date: 10/02/2019 CLINICAL DATA:  Acute respiratory failure EXAM: CHEST FLUOROSCOPY TECHNIQUE: Real-time fluoroscopic evaluation of the chest was performed. FLUOROSCOPY TIME:  Fluoroscopy Time:  1 minutes Radiation Exposure Index (if provided by the fluoroscopic device): 3.7 mGy Number of Acquired Spot Images: 0 COMPARISON:  None other than chest x-ray evaluations and previous CTs. FINDINGS: Limited motion of the left and right hemidiaphragm with maximal inspiratory effort. Lung volumes remain low. Changes of median sternotomy and CABG. IMPRESSION: Limited motion with low volume chest of the right and left hemidiaphragm. No signs of paradoxical motion to suggest diaphragmatic paralysis. Electronically Signed   By: Zetta Bills M.D.   On: 10/02/2019 13:53   DG CHEST PORT 1 VIEW  Result  Date: 10/03/2019 CLINICAL DATA:   Acute respiratory failure. EXAM: PORTABLE CHEST 1 VIEW COMPARISON:  Radiograph 09/30/2019. FINDINGS: Persistent low lung volumes. Post median sternotomy. Bibasilar opacities with blunting of the costophrenic angles, unchanged from prior. No pulmonary edema or pneumothorax. Surgical hardware in the lower cervical spine is partially included. IMPRESSION: No significant change from prior exam with low lung volumes. Bibasilar opacities likely combination of atelectasis and small pleural effusions. Electronically Signed   By: Narda Rutherford M.D.   On: 10/03/2019 06:23   DG Chest Port 1 View  Result Date: 09/30/2019 CLINICAL DATA:  Acute respiratory failure with hypoxemia. EXAM: PORTABLE CHEST 1 VIEW COMPARISON:  09/29/2019 FINDINGS: Sternotomy wires unchanged. Lungs are hypoinflated with stable bibasilar opacification likely small effusions with associated basilar atelectasis. Cardiomediastinal silhouette and remainder of the exam is unchanged. IMPRESSION: Stable bibasilar opacification likely effusions with atelectasis. Electronically Signed   By: Elberta Fortis M.D.   On: 09/30/2019 09:20   DG Chest Port 1 View  Result Date: 09/29/2019 CLINICAL DATA:  Acute respiratory failure/hypoxia. EXAM: PORTABLE CHEST 1 VIEW COMPARISON:  09/26/2019 FINDINGS: Lungs are hypoinflated demonstrate slight worsening bibasilar opacification which may be due to effusions/atelectasis. Mild stable cardiomegaly. Remainder of the exam is unchanged. IMPRESSION: Hypoinflation with slight worsening bibasilar opacification likely representing effusions with atelectasis. Electronically Signed   By: Elberta Fortis M.D.   On: 09/29/2019 08:44   DG Chest Port 1 View  Result Date: 09/26/2019 CLINICAL DATA:  Respiratory failure. EXAM: PORTABLE CHEST 1 VIEW COMPARISON:  09/25/2019. FINDINGS: Prior CABG. Stable cardiomegaly. Low lung volumes with persistent bibasilar atelectasis. Similar findings noted on prior exam. No prominent pleural  effusion. No pneumothorax. Prior cervical spine fusion. Carotid vascular calcification. IMPRESSION: 1.  Prior CABG.  Stable cardiomegaly. 2. Low lung volumes with persistent bibasilar atelectasis. Similar findings noted on prior exam. 3.  Carotid vascular disease. Electronically Signed   By: Maisie Fus  Register   On: 09/26/2019 06:40   DG Chest Port 1 View  Result Date: 09/25/2019 CLINICAL DATA:  Acute respiratory failure. EXAM: PORTABLE CHEST 1 VIEW COMPARISON:  Radiograph 09/23/2019 FINDINGS: Low lung volumes limit assessment. Post median sternotomy. Cardiomegaly. Elevated right hemidiaphragm versus volume loss at the right lung base. Streaky left lung base atelectasis. Improved vascular congestion. No pneumothorax. IMPRESSION: 1. Low lung volumes. 2. Elevated right hemidiaphragm versus volume loss at the right lung base, this may be due to atelectasis/collapse or pleural fluid. 3. Cardiomegaly.  Improved vascular congestion. Electronically Signed   By: Narda Rutherford M.D.   On: 09/25/2019 05:57   DG CHEST PORT 1 VIEW  Result Date: 09/23/2019 CLINICAL DATA:  Shortness of breath.  Recent ACDF. EXAM: PORTABLE CHEST 1 VIEW COMPARISON:  Chest x-ray dated July 20, 2019. FINDINGS: Stable mild cardiomegaly status post CABG. Mild pulmonary vascular congestion. Low lung volumes with right greater than left basilar atelectasis. Possible small right pleural effusion. No pneumothorax. No acute osseous abnormality. Prior cervical ACDF. IMPRESSION: 1. Mild pulmonary vascular congestion. Possible small right pleural effusion. 2. Low lung volumes with right greater than left basilar opacities, favor atelectasis. Electronically Signed   By: Obie Dredge M.D.   On: 09/23/2019 09:00   DG Chest Portable 1 View  Result Date: 07/20/2019 CLINICAL DATA:  68 year old male with history of coronary artery disease and hypertension. EXAM: PORTABLE CHEST 1 VIEW COMPARISON:  Chest radiograph dated 02/04/2018 FINDINGS:  Shallow inspiration. No focal consolidation, pleural effusion, pneumothorax. Mild cardiomegaly. No acute osseous pathology. Median sternotomy wires and CABG vascular  clips noted. IMPRESSION: 1. No acute cardiopulmonary process. 2. Mild cardiomegaly. Electronically Signed   By: Elgie Collard M.D.   On: 07/20/2019 18:00   VAS [place] LOWER EXTREMITY VENOUS (DVT)  Result Date: 09/25/2019  Lower Venous Study Indications: Acute respiratory failure with hypoxia.  Performing Technologist: Marilynne Halsted RDMS, RVT  Examination Guidelines: A complete evaluation includes B-mode imaging, spectral Doppler, color Doppler, and power Doppler as needed of all accessible portions of each vessel. Bilateral testing is considered an integral part of a complete examination. Limited examinations for reoccurring indications may be performed as noted.  +---------+---------------+---------+-----------+----------+--------------+ RIGHT    CompressibilityPhasicitySpontaneityPropertiesThrombus Aging +---------+---------------+---------+-----------+----------+--------------+ CFV      Full           Yes      Yes                                 +---------+---------------+---------+-----------+----------+--------------+ SFJ      Full                                                        +---------+---------------+---------+-----------+----------+--------------+ FV Prox  Full                                                        +---------+---------------+---------+-----------+----------+--------------+ FV Mid   Full                                                        +---------+---------------+---------+-----------+----------+--------------+ FV DistalFull                                                        +---------+---------------+---------+-----------+----------+--------------+ PFV      Full                                                         +---------+---------------+---------+-----------+----------+--------------+ POP      Full           Yes      Yes                                 +---------+---------------+---------+-----------+----------+--------------+ PTV      Full                                                        +---------+---------------+---------+-----------+----------+--------------+ PERO     Full                                                        +---------+---------------+---------+-----------+----------+--------------+   +---------+---------------+---------+-----------+----------+--------------+  LEFT     CompressibilityPhasicitySpontaneityPropertiesThrombus Aging +---------+---------------+---------+-----------+----------+--------------+ CFV      Full           Yes      Yes                                 +---------+---------------+---------+-----------+----------+--------------+ SFJ      Full                                                        +---------+---------------+---------+-----------+----------+--------------+ FV Prox  Full                                                        +---------+---------------+---------+-----------+----------+--------------+ FV Mid   Full                                                        +---------+---------------+---------+-----------+----------+--------------+ FV DistalFull                                                        +---------+---------------+---------+-----------+----------+--------------+ PFV      Full                                                        +---------+---------------+---------+-----------+----------+--------------+ POP      Full           Yes      Yes                                 +---------+---------------+---------+-----------+----------+--------------+ PTV      Full                                                         +---------+---------------+---------+-----------+----------+--------------+ PERO     Full                                                        +---------+---------------+---------+-----------+----------+--------------+     Summary: Right: There is no evidence of deep vein thrombosis in the lower extremity. No cystic structure found in the popliteal fossa. Left: There is no evidence of deep vein thrombosis in the lower extremity. No cystic structure found in the popliteal fossa.  *  See table(s) above for measurements and observations. Electronically signed by Gretta Beganodd Early MD on 09/25/2019 at 5:39:55 PM.    Final      Treatments: surgery: C3-4, C4-5 and C5-6 anterior cervical discectomy/decompression; C3-4, C4-5 and C5-6 interbody arthrodesis with local morcellized autograft bone and Zimmer DBM; insertion of interbody prosthesis at C3-4, C4-5 and C5-6 (Zimmer peek interbody prosthesis); anterior cervical plating from C3-C6 with globus titanium plate  Discharge Exam: Blood pressure 122/74, pulse 95, temperature 97.9 F (36.6 C), resp. rate (!) 22, height 5\' 6"  (1.676 m), weight 109.3 kg, SpO2 97 %.  Alert and oriented x 4 PERRLA CN II-XII grossly intact MAE, Strength and sensation intact Incision is clean, dry, and intact   Disposition: Discharge disposition: [facility] Not Defined        Allergies as of 10/03/2019      Reactions   Vancomycin Anaphylaxis, Shortness Of Breath, Other (See Comments)   Was informed after heart surgery Breathing problem patient unsure   Aldactone [spironolactone]    GYNECOMASTIA    Chlorthalidone Other (See Comments)   GOUT   Nsaids Other (See Comments)   On blood thinner      Medication List    TAKE these medications   acetaminophen 500 MG tablet Commonly known as: TYLENOL Take 1,000 mg by mouth every 6 (six) hours as needed for moderate pain.   apixaban 5 MG Tabs tablet Commonly known as: ELIQUIS Take 1 tablet  (5 mg total) by mouth 2 (two) times daily.   atorvastatin 20 MG tablet Commonly known as: LIPITOR TAKE 1 TABLET BY MOUTH EVERY DAY   clonazePAM 0.25 MG disintegrating tablet Commonly known as: KLONOPIN Take 1 tablet (0.25 mg total) by mouth 2 (two) times daily.   CO Q 10 PO Take 400 mg by mouth daily.   cyclobenzaprine 10 MG tablet Commonly known as: FLEXERIL Take 1 tablet (10 mg total) by mouth 3 (three) times daily as needed for muscle spasms.   diltiazem 180 MG 24 hr capsule Commonly known as: CARDIZEM CD Take 1 capsule (180 mg total) by mouth daily.   docusate sodium 100 MG capsule Commonly known as: COLACE Take 1 capsule (100 mg total) by mouth 2 (two) times daily.   eplerenone 25 MG tablet Commonly known as: INSPRA TAKE 1 TABLET BY MOUTH TWICE A DAY   furosemide 20 MG tablet Commonly known as: LASIX TAKE 1 TABLET BY MOUTH DAILY AS NEEDED FOR SWELLING/WEIGHT GAIN   hydrALAZINE 50 MG tablet Commonly known as: APRESOLINE Take 1.5 tablets (75 mg total) by mouth 3 (three) times daily.   irbesartan 300 MG tablet Commonly known as: AVAPRO Take 1 tablet (300 mg total) by mouth daily. What changed: when to take this   LORazepam 0.5 MG tablet Commonly known as: Ativan Take 1 tablet (0.5 mg total) by mouth 3 (three) times daily.   metoprolol tartrate 25 MG tablet Commonly known as: LOPRESSOR TAKE 1 TABLET (25 MG TOTAL) BY MOUTH EVERY 8 (EIGHT) HOURS AS NEEDED (A-FIB). What changed: when to take this   multivitamin tablet Take 1 tablet by mouth daily.   nitroGLYCERIN 0.4 MG SL tablet Commonly known as: NITROSTAT Place 1 tablet (0.4 mg total) under the tongue every 5 (five) minutes x 3 doses as needed for chest pain.   omeprazole 20 MG capsule Commonly known as: PRILOSEC TAKE 1 CAPSULE BY MOUTH EVERY OTHER DAY. What changed: See the new instructions.   Oxycodone HCl 10 MG Tabs Take 1  tablet (10 mg total) by mouth every 4 (four) hours as needed for severe pain  ((score 7 to 10)).   PRESCRIPTION MEDICATION Pt uses CPAP machine at bedtime   sertraline 50 MG tablet Commonly known as: ZOLOFT Take 50 mg by mouth daily.   tamsulosin 0.4 MG Caps capsule Commonly known as: FLOMAX Take 2 capsules (0.8 mg total) by mouth daily. Start taking on: October 04, 2019      Follow-up Information    Tressie StalkerJenkins, Jeffrey, MD. Call.   Specialty: Neurosurgery Why: Call when discharged from CIR to set up follow-up appointment. Contact information: [address] [place] [id number] [phone]           Signed: Floreen ComberMeghan D Kito Cuffe 10/03/2019, 11:28 AM

## 2019-10-03 NOTE — Progress Notes (Signed)
Courtney Heys, MD  Physician  Physical Medicine and Rehabilitation  PMR Pre-admission  Signed  Date of Service:  10/03/2019  9:40 AM      Related encounter: Admission (Discharged) from 09/21/2019 in [facility]        Show:Clear all '[x]' Manual'[x]' Template'[x]' Copied  Added by: '[x]' Cristina Gong, RN'[x]' Courtney Heys, MD  '[]' Hover for details PMR Admission Coordinator Pre-Admission Assessment   Patient: [name] [name] is an 68 y.o., male MRN: [id number] DOB: [birth year] Height: '5\' 6"'  (167.6 cm) Weight: 109.3 kg   Insurance Information HMO:     PPO:      PCP:      IPA:      80/20:      OTHER:  PRIMARY: Medicare a and b      Policy#: [id number]      Subscriber: pt Benefits:  Phone #: passport one online     Name: 12/29 Eff. Date: 02/03/2016     Deduct: $1408      Out of Pocket Max: none      Life Max: none CIR: 100%      SNF: 20 full days Outpatient: 80%     Co-Pay: 20% Home Health: 100%      Co-Pay: none DME: 80%     Co-Pay: 20% Providers: pt choice  SECONDARY: BCBS      Policy#: [id number]      Subscriber: pt   Medicaid Application Date:       Case Manager:  Disability Application Date:       Case Worker:    The "Data Collection Information Summary" for patients in Inpatient Rehabilitation Facilities with attached "Privacy Act [place] Records" was provided and verbally reviewed with: Patient and Family   Emergency Contact Information         Contact Information     Name Relation Home Work Mobile    [place] Spouse [phone]        [place],Sherri Daughter [phone]   [phone]         Current Medical History  Patient Admitting Diagnosis: myelopathy   History of Present Illness:   68 year old right-handed male with history of CAD status post CABG 2004, lumbar laminectomy, left TKA 2018, hypertension, diastolic congestive heart failure, anxiety, atrial fibrillation with ablation August 2019 and  maintained on Eliquis, sleep apnea with CPAP.  Presented September 21, 2019 with severe neck pain and quadriparesis as well as gait instability.  X-rays and imaging demonstrated a large herniated disc at C4-5 with severe spinal stenosis, cervical myelopathy and spinal cord signal change.  He had moderate stenosis at C3-4 and 5-6.  Underwent C3-4, 4-5 and C5-6 anterior cervical discectomy decompression, C3-4 4-55-6 interbody arthrodesis with insertion of interbody prosthesis and anterior cervical plating from C3-C6 with globus titanium plate September 20, 7516 per Dr. Arnoldo Morale.  Cervical brace as directed.  Noted on September 23, 2019 rapid response contacted for respiratory distress requiring NRB 15 L saturations 95%.  There was some reported swelling at the surgical site suspect hematoma.  Chest x-ray showed mild pulmonary vascular congestion with possible small right pleural effusion.  He was transferred to ICU follow-up per critical care medicine.  Patient did improve with the use of CPAP however after taking off CPAP the following morning again with increasing shortness of breath received albuterol and Decadron as advised and changed to BiPAP.  Sniff test 10/02/2019 showed no diaphragmatic paralysis.  Patient also remained on intravenous Lopressor as  well as Lasix with noted history of hypertension atrial fibrillation and intravenous heparin was initiated and plan is to resume Eliquis as prior to admission.  Patient did not require intubation and improved with ongoing respiratory treatment.  Lower extremity Dopplers completed negative for DVT.  Noted leukocytosis 28,700 felt to be secondary to steroids which have been tapered to off.  Surgical site follow-up per neurosurgery did not appear to show any signs of infection and follow-up imaging demonstrates prevertebral swelling largely resolved.  Mild AKI with creatinine 1.45 and Lasix has been discontinued.  Patient is tolerating a regular diet.     Patient's  medical record from [facility] has been reviewed by the rehabilitation admission coordinator and physician.   Past Medical History      Past Medical History:  Diagnosis Date  . Anxiety    . CHF (congestive heart failure) (HCC)      h/o due to fluid overload in recovery room  . Coronary artery disease      2v CABG, 2004 [facility])  . COVID-19    . DJD (degenerative joint disease)    . Dyspnea      on exertion  . GERD (gastroesophageal reflux disease)    . History of kidney stones      3 stones yrs. ago  . HLD (hyperlipidemia)    . Hypertension    . MI, old 2004  . OA (osteoarthritis)    . Obesity    . Persistent atrial fibrillation ([place])      failed medical therapy with sotalol, s/p PVI x 2  . Sleep apnea      on CPAP  . Spinal stenosis    . Typical atrial flutter ([place])    COVID positive 06/2019.  COVID negative 09/19/2019   Family History   family history includes Breast cancer in his mother; Coronary artery disease in his father; Hypertension in his father and mother; Stroke in his father.   Prior Rehab/Hospitalizations Has the patient had prior rehab or hospitalizations prior to admission? Yes   Has the patient had major surgery during 100 days prior to admission? Yes              Current Medications   Current Facility-Administered Medications:  .  acetaminophen (TYLENOL) tablet 650 mg, 650 mg, Oral, Q4H PRN, 650 mg at 09/25/19 1749 **OR** acetaminophen (TYLENOL) suppository 650 mg, 650 mg, Rectal, Q4H PRN, Newman Pies, MD .  albuterol (PROVENTIL) (2.5 MG/3ML) 0.083% nebulizer solution 2.5 mg, 2.5 mg, Nebulization, Q6H PRN, Reinaldo Meeker, Meghan D, NP, 2.5 mg at 09/24/19 1755 .  alum & mag hydroxide-simeth (MAALOX/MYLANTA) 200-200-20 MG/5ML suspension 30 mL, 30 mL, Oral, Q6H PRN, Newman Pies, MD .  apixaban Arne Cleveland) tablet 5 mg, 5 mg, Oral, BID, Dang, Thuy D, RPH, 5 mg at 10/03/19 0959 .  atorvastatin (LIPITOR) tablet 20 mg, 20 mg, Oral, q1800, Newman Pies, MD, 20 mg at 10/02/19 1818 .  bisacodyl (DULCOLAX) suppository 10 mg, 10 mg, Rectal, Daily PRN, Newman Pies, MD .  chlorhexidine (PERIDEX) 0.12 % solution 15 mL, 15 mL, Mouth Rinse, BID, Newman Pies, MD, 15 mL at 10/03/19 0958 .  Chlorhexidine Gluconate Cloth 2 % PADS 6 each, 6 each, Topical, Daily, Newman Pies, MD, 6 each at 10/03/19 1000 .  clonazePAM (KLONOPIN) disintegrating tablet 0.25 mg, 0.25 mg, Oral, BID, Rush Farmer, MD, 0.25 mg at 10/03/19 0959 .  cyclobenzaprine (FLEXERIL) tablet 10 mg, 10 mg, Oral, TID PRN, Newman Pies, MD, 10 mg  at 09/30/19 0908 .  diltiazem (CARDIZEM CD) 24 hr capsule 180 mg, 180 mg, Oral, Daily, Rosana Hoes, Whitney F, NP, 180 mg at 10/03/19 1000 .  docusate sodium (COLACE) capsule 100 mg, 100 mg, Oral, BID, Newman Pies, MD, 100 mg at 10/03/19 0959 .  hydrALAZINE (APRESOLINE) tablet 75 mg, 75 mg, Oral, TID, Newman Pies, MD, 75 mg at 10/03/19 0959 .  LORazepam (ATIVAN) injection 0.5 mg, 0.5 mg, Intravenous, Q4H PRN, Shearon Stalls, Rahul P, PA-C, 0.5 mg at 10/02/19 2103 .  MEDLINE mouth rinse, 15 mL, Mouth Rinse, q12n4p, Newman Pies, MD, 15 mL at 10/02/19 1643 .  menthol-cetylpyridinium (CEPACOL) lozenge 3 mg, 1 lozenge, Oral, PRN **OR** phenol (CHLORASEPTIC) mouth spray 1 spray, 1 spray, Mouth/Throat, PRN, Newman Pies, MD .  metoprolol tartrate (LOPRESSOR) tablet 12.5 mg, 12.5 mg, Oral, BID, Stretch, Marily Lente, MD, 12.5 mg at 10/03/19 1000 .  morphine 4 MG/ML injection 4 mg, 4 mg, Intravenous, Q2H PRN, Newman Pies, MD, 4 mg at 10/03/19 0043 .  ondansetron (ZOFRAN) tablet 4 mg, 4 mg, Oral, Q6H PRN **OR** ondansetron (ZOFRAN) injection 4 mg, 4 mg, Intravenous, Q6H PRN, Newman Pies, MD, 4 mg at 10/01/19 1040 .  oxyCODONE (Oxy IR/ROXICODONE) immediate release tablet 10 mg, 10 mg, Oral, Q3H PRN, Newman Pies, MD, 10 mg at 10/03/19 0959 .  oxyCODONE (Oxy IR/ROXICODONE) immediate release tablet 5 mg, 5 mg, Oral, Q3H PRN,  Newman Pies, MD, 5 mg at 10/02/19 0730 .  pantoprazole (PROTONIX) EC tablet 40 mg, 40 mg, Oral, Daily, Newman Pies, MD, 40 mg at 10/03/19 0959 .  sertraline (ZOLOFT) tablet 50 mg, 50 mg, Oral, Daily, Newman Pies, MD, 50 mg at 10/03/19 1000 .  spironolactone (ALDACTONE) tablet 25 mg, 25 mg, Oral, Daily, Newman Pies, MD, 25 mg at 10/03/19 1000 .  tamsulosin (FLOMAX) capsule 0.8 mg, 0.8 mg, Oral, Daily, Newman Pies, MD, 0.8 mg at 10/03/19 0960   Patients Current Diet:     Diet Order                      Diet regular Room service appropriate? Yes; Fluid consistency: Thin  Diet effective now                   Precautions / Restrictions Precautions Precautions: Cervical Precaution Booklet Issued: Yes (comment) Precaution Comments: verbal discussion of handout and precautions Cervical Brace: Hard collar Restrictions Weight Bearing Restrictions: No    Has the patient had 2 or more falls or a fall with injury in the past year? No   Prior Activity Level Limited Community (1-2x/wk): Independent and driving pta   Prior Functional Level Self Care: Did the patient need help bathing, dressing, using the toilet or eating? Needed some help   Indoor Mobility: Did the patient need assistance with walking from room to room (with or without device)? Needed some help   Stairs: Did the patient need assistance with internal or external stairs (with or without device)? Needed some help   Functional Cognition: Did the patient need help planning regular tasks such as shopping or remembering to take medications? Independent   Home Assistive Devices / Equipment Home Assistive Devices/Equipment: None Home Equipment: Walker - 2 wheels, Cane - single point   Prior Device Use: Indicate devices/aids used by the patient prior to current illness, exacerbation or injury? None of the above   Current Functional Level Cognition   Overall Cognitive Status: Within Functional Limits  for tasks assessed Orientation Level: Oriented X4  General Comments: Pt asking for Ativan s/p PT/OT session. RN aware.    Extremity Assessment (includes Sensation/Coordination)   Upper Extremity Assessment: Generalized weakness RUE Deficits / Details: poor grip strength; below shoulder movements, slow and decreased coordination; Pt AROM to 35* shoulder flex. RUE Coordination: decreased fine motor, decreased gross motor LUE Deficits / Details: AROM 45* shoulder flex. Pt with decreased coordination and poor grip strength LUE Coordination: decreased fine motor, decreased gross motor  Lower Extremity Assessment: Generalized weakness     ADLs   Overall ADL's : Needs assistance/impaired Eating/Feeding: Set up, Sitting Grooming: Minimal assistance, Sitting, Standing Grooming Details (indicate cue type and reason): Assist for weakness in BUEs Upper Body Bathing: Moderate assistance, Sitting Lower Body Bathing: Maximal assistance, Cueing for safety Upper Body Dressing : Moderate assistance, Sitting Upper Body Dressing Details (indicate cue type and reason): Assist as arms are limited by poor coordination and weakness Lower Body Dressing: Maximal assistance, Sitting/lateral leans, Sit to/from stand Toilet Transfer: Minimal assistance, +2 for physical assistance, +2 for safety/equipment, Ambulation, BSC, RW Toileting- Clothing Manipulation and Hygiene: Moderate assistance, Sitting/lateral lean, Sit to/from stand, Adhering to back precautions Functional mobility during ADLs: Minimal assistance, +2 for physical assistance, +2 for safety/equipment, Rolling walker, Cueing for safety General ADL Comments: Pt education on back precautions and reducing lean at sink.     Mobility   Overal bed mobility: Needs Assistance Bed Mobility: Supine to Sit, Sit to Sidelying, Sit to Supine Rolling: Min assist Sidelying to sit: Min assist Sit to supine: Mod assist, +2 for safety/equipment, +2 for physical  assistance, HOB elevated Sit to sidelying: Max assist, +2 for physical assistance General bed mobility comments: MinA with use of rail for and trunk elevation; sitting EOB to supine, assist with BLEs.     Transfers   Overall transfer level: Needs assistance Equipment used: Rolling walker (2 wheeled) Transfers: Sit to/from Stand, Stand Pivot Transfers Sit to Stand: Min assist, Mod assist, +2 physical assistance, +2 safety/equipment Stand pivot transfers: Min assist, Mod assist, +2 physical assistance, +2 safety/equipment, From elevated surface General transfer comment: Pt able to stand with RW for pivot and for ambulation with assist for steadying     Ambulation / Gait / Stairs / Wheelchair Mobility   Ambulation/Gait Ambulation/Gait assistance: Min assist, +2 safety/equipment Gait Distance (Feet): 20 Feet(10+10 to and from bathroom) Assistive device: Rolling walker (2 wheeled) Gait Pattern/deviations: Step-through pattern, Decreased stride length, Drifts right/left, Trunk flexed General Gait Details: Min assist for steadying, pt with 2 LOB during initial steps after standing requiring mod steadying assist at gait belt to correct. Verbal cuing for placement in RW, upright posture. Gait velocity: decr Gait velocity interpretation: <1.8 ft/sec, indicate of risk for recurrent falls     Posture / Balance Dynamic Sitting Balance Sitting balance - Comments: unable to accept challenge. Balance Overall balance assessment: Needs assistance Sitting-balance support: Feet supported, Single extremity supported Sitting balance-Leahy Scale: Fair Sitting balance - Comments: unable to accept challenge. Standing balance support: Bilateral upper extremity supported Standing balance-Leahy Scale: Poor Standing balance comment: reliant on external support     Special needs/care consideration BiPAP/CPAP  Used CPAP pta/[facility]  Continuous Drip IV  Dialysis         Days  Life Vest  Oxygen O2 at 2  liters [place]; does not use O2 pta Special Bed  Trach Size  Wound Vac  Skin surgical incision  Bowel mgmt: continent LBM 12/28 Bladder mgmt: external catheter Diabetic mgmt:  Behavioral consideration  Chemo/radiation  Designated visitor is wife, Hilda Blades    Previous Home Environment  Living Arrangements: Spouse/significant other  Lives With: Spouse Available Help at Discharge: Family, Available 24 hours/day Type of Home: House Home Layout: One level Home Access: Stairs to enter Technical brewer of Steps: 1 Bathroom Shower/Tub: Multimedia programmer: Handicapped height Bathroom Accessibility: Yes How Accessible: Accessible via walker Weiser: No   Discharge Living Setting Plans for Discharge Living Setting: Patient's home, Lives with (comment)(wife) Type of Home at Discharge: House Discharge Home Layout: One level Discharge Home Access: Stairs to enter Entrance Stairs-Number of Steps: 1 Discharge Bathroom Shower/Tub: Walk-in shower Discharge Bathroom Toilet: Handicapped height Discharge Bathroom Accessibility: Yes How Accessible: Accessible via walker Does the patient have any problems obtaining your medications?: No   Social/Family/Support Systems Patient Roles: Spouse Contact Information: wife, Hilda Blades Anticipated Caregiver: wife Anticipated Caregiver's Contact Information: see above Ability/Limitations of Caregiver: no limitations Caregiver Availability: 24/7 Discharge Plan Discussed with Primary Caregiver: Yes Is Caregiver In Agreement with Plan?: Yes Does Caregiver/Family have Issues with Lodging/Transportation while Pt is in Rehab?: No   Goals/Additional Needs Patient/Family Goal for Rehab: Mod I to supervision with PT and OT Expected length of stay: ELOS 7 to 10 days Pt/Family Agrees to Admission and willing to participate: Yes Program Orientation Provided & Reviewed with Pt/Caregiver Including Roles  &  Responsibilities: Yes   Decrease burden of Care through IP rehab admission:    Possible need for SNF placement upon discharge:    Patient Condition: I have reviewed medical records from [facility], spoken with CM, and patient and spouse. I met with patient at the bedside for inpatient rehabilitation assessment.  Patient will benefit from ongoing PT and OT, can actively participate in 3 hours of therapy a day 5 days of the week, and can make measurable gains during the admission.  Patient will also benefit from the coordinated team approach during an Inpatient Acute Rehabilitation admission.  The patient will receive intensive therapy as well as Rehabilitation physician, nursing, social worker, and care management interventions.  Due to bladder management, bowel management, safety, skin/wound care, disease management, medication administration, pain management and patient education the patient requires 24 hour a day rehabilitation nursing.  The patient is currently min to mod assist with mobility and basic ADLs.  Discharge setting and therapy post discharge at home with home health is anticipated.  Patient has agreed to participate in the Acute Inpatient Rehabilitation Program and will admit today.   Preadmission Screen Completed By:  Cleatrice Burke, 10/03/2019 10:05 AM ______________________________________________________________________   Discussed status with Dr. Dagoberto Ligas on  10/03/2019 at 31 and received approval for admission today.   Admission Coordinator:  Cleatrice Burke, RN, time  1010 Date  10/03/2019    Assessment/Plan: Diagnosis: 1. Does the need for close, 24 hr/day Medical supervision in concert with the patient's rehab needs make it unreasonable for this patient to be served in a less intensive setting? Yes 2. Co-Morbidities requiring supervision/potential complications: CAD s/p CABG, A Fib, OSA- CPAP, diastolic CHF, resp distress 3. Due to bladder  management, bowel management, safety, skin/wound care, disease management, medication administration, pain management and patient education, does the patient require 24 hr/day rehab nursing? Yes 4. Does the patient require coordinated care of a physician, rehab nurse, PT, OT, and SLP to address physical and functional deficits in the context of the above medical  diagnosis(es)? Yes Addressing deficits in the following areas: balance, endurance, locomotion, strength, transferring, bathing, dressing, feeding, grooming and toileting 5. Can the patient actively participate in an intensive therapy program of at least 3 hrs of therapy 5 days a week? Yes 6. The potential for patient to make measurable gains while on inpatient rehab is good 7. Anticipated functional outcomes upon discharge from inpatient rehab: modified independent and supervision PT, modified independent and supervision OT, n/a SLP 8. Estimated rehab length of stay to reach the above functional goals is: 7-10 days 9. Anticipated discharge destination: Home 10. Overall Rehab/Functional Prognosis: good     MD Signature:          Revision History

## 2019-10-03 NOTE — Progress Notes (Signed)
Pt arrived to unit via bed, pt is a&o x 4, able to make needs known, BiPap in room for resp use at HS, Pt currently on HiFlo 5L, pt reports no pain at this time, oriented to unit,

## 2019-10-03 NOTE — PMR Pre-admission (Signed)
PMR Admission Coordinator Pre-Admission Assessment  Patient: [name] [name] is an 68 y.o., male MRN: [id number] DOB: May 16, 1951 Height: '5\' 6"'  (167.6 cm) Weight: 109.3 kg  Insurance Information HMO:     PPO:      PCP:      IPA:      80/20:      OTHER:  PRIMARY: Medicare a and b      Policy#: [id number]      Subscriber: pt Benefits:  Phone #: passport one online     Name: 12/29 Eff. Date: 02/03/2016     Deduct: $1408      Out of Pocket Max: none      Life Max: none CIR: 100%      SNF: 20 full days Outpatient: 80%     Co-Pay: 20% Home Health: 100%      Co-Pay: none DME: 80%     Co-Pay: 20% Providers: pt choice  SECONDARY: BCBS      Policy#: [id number]      Subscriber: pt  Medicaid Application Date:       Case Manager:  Disability Application Date:       Case Worker:   The "Data Collection Information Summary" for patients in Inpatient Rehabilitation Facilities with attached "Privacy Act [place] Records" was provided and verbally reviewed with: Patient and Family  Emergency Contact Information Contact Information    Name Relation Home Work Mobile   [place] Spouse [phone]     [place],Sherri Daughter [phone]  [phone]      Current Medical History  Patient Admitting Diagnosis: myelopathy  History of Present Illness:   68 year old right-handed male with history of CAD status post CABG 2004, lumbar laminectomy, left TKA 2018, hypertension, diastolic congestive heart failure, anxiety, atrial fibrillation with ablation August 2019 and maintained on Eliquis, sleep apnea with CPAP.  Presented September 21, 2019 with severe neck pain and quadriparesis as well as gait instability.  X-rays and imaging demonstrated a large herniated disc at C4-5 with severe spinal stenosis, cervical myelopathy and spinal cord signal change.  He had moderate stenosis at C3-4 and 5-6.  Underwent C3-4, 4-5 and C5-6 anterior cervical discectomy decompression, C3-4 4-55-6 interbody  arthrodesis with insertion of interbody prosthesis and anterior cervical plating from C3-C6 with globus titanium plate September 21, 6947 per Dr. Arnoldo Morale.  Cervical brace as directed.  Noted on September 23, 2019 rapid response contacted for respiratory distress requiring NRB 15 L saturations 95%.  There was some reported swelling at the surgical site suspect hematoma.  Chest x-ray showed mild pulmonary vascular congestion with possible small right pleural effusion.  He was transferred to ICU follow-up per critical care medicine.  Patient did improve with the use of CPAP however after taking off CPAP the following morning again with increasing shortness of breath received albuterol and Decadron as advised and changed to BiPAP.  Sniff test 10/02/2019 showed no diaphragmatic paralysis.  Patient also remained on intravenous Lopressor as well as Lasix with noted history of hypertension atrial fibrillation and intravenous heparin was initiated and plan is to resume Eliquis as prior to admission.  Patient did not require intubation and improved with ongoing respiratory treatment.  Lower extremity Dopplers completed negative for DVT.  Noted leukocytosis 28,700 felt to be secondary to steroids which have been tapered to off.  Surgical site follow-up per neurosurgery did not appear to show any signs of infection and follow-up imaging demonstrates prevertebral swelling largely resolved.  Mild AKI with creatinine 1.45 and Lasix has been  discontinued.  Patient is tolerating a regular diet.    Patient[facility] has been reviewed by the rehabilitation admission coordinator and physician.  Past Medical History  Past Medical History:  Diagnosis Date  . Anxiety   . CHF (congestive heart failure) (HCC)    h/o due to fluid overload in recovery room  . Coronary artery disease    2v CABG, 2004 [facility])  . COVID-19   . DJD (degenerative joint disease)   . Dyspnea    on exertion  . GERD  (gastroesophageal reflux disease)   . History of kidney stones    3 stones yrs. ago  . HLD (hyperlipidemia)   . Hypertension   . MI, old 2004  . OA (osteoarthritis)   . Obesity   . Persistent atrial fibrillation ([place])    failed medical therapy with sotalol, s/p PVI x 2  . Sleep apnea    on CPAP  . Spinal stenosis   . Typical atrial flutter ([place])   COVID positive 06/2019.  COVID negative 09/19/2019  Family History   family history includes Breast cancer in his mother; Coronary artery disease in his father; Hypertension in his father and mother; Stroke in his father.  Prior Rehab/Hospitalizations Has the patient had prior rehab or hospitalizations prior to admission? Yes  Has the patient had major surgery during 100 days prior to admission? Yes   Current Medications  Current Facility-Administered Medications:  .  acetaminophen (TYLENOL) tablet 650 mg, 650 mg, Oral, Q4H PRN, 650 mg at 09/25/19 1749 **OR** acetaminophen (TYLENOL) suppository 650 mg, 650 mg, Rectal, Q4H PRN, Newman Pies, MD .  albuterol (PROVENTIL) (2.5 MG/3ML) 0.083% nebulizer solution 2.5 mg, 2.5 mg, Nebulization, Q6H PRN, Reinaldo Meeker, Meghan D, NP, 2.5 mg at 09/24/19 1755 .  alum & mag hydroxide-simeth (MAALOX/MYLANTA) 200-200-20 MG/5ML suspension 30 mL, 30 mL, Oral, Q6H PRN, Newman Pies, MD .  apixaban Arne Cleveland) tablet 5 mg, 5 mg, Oral, BID, Dang, Thuy D, RPH, 5 mg at 10/03/19 0959 .  atorvastatin (LIPITOR) tablet 20 mg, 20 mg, Oral, q1800, Newman Pies, MD, 20 mg at 10/02/19 1818 .  bisacodyl (DULCOLAX) suppository 10 mg, 10 mg, Rectal, Daily PRN, Newman Pies, MD .  chlorhexidine (PERIDEX) 0.12 % solution 15 mL, 15 mL, Mouth Rinse, BID, Newman Pies, MD, 15 mL at 10/03/19 0958 .  Chlorhexidine Gluconate Cloth 2 % PADS 6 each, 6 each, Topical, Daily, Newman Pies, MD, 6 each at 10/03/19 1000 .  clonazePAM (KLONOPIN) disintegrating tablet 0.25 mg, 0.25 mg, Oral, BID, Rush Farmer, MD, 0.25  mg at 10/03/19 0959 .  cyclobenzaprine (FLEXERIL) tablet 10 mg, 10 mg, Oral, TID PRN, Newman Pies, MD, 10 mg at 09/30/19 0908 .  diltiazem (CARDIZEM CD) 24 hr capsule 180 mg, 180 mg, Oral, Daily, Rosana Hoes, Whitney F, NP, 180 mg at 10/03/19 1000 .  docusate sodium (COLACE) capsule 100 mg, 100 mg, Oral, BID, Newman Pies, MD, 100 mg at 10/03/19 0959 .  hydrALAZINE (APRESOLINE) tablet 75 mg, 75 mg, Oral, TID, Newman Pies, MD, 75 mg at 10/03/19 0959 .  LORazepam (ATIVAN) injection 0.5 mg, 0.5 mg, Intravenous, Q4H PRN, Shearon Stalls, Rahul P, PA-C, 0.5 mg at 10/02/19 2103 .  MEDLINE mouth rinse, 15 mL, Mouth Rinse, q12n4p, Newman Pies, MD, 15 mL at 10/02/19 1643 .  menthol-cetylpyridinium (CEPACOL) lozenge 3 mg, 1 lozenge, Oral, PRN **OR** phenol (CHLORASEPTIC) mouth spray 1 spray, 1 spray, Mouth/Throat, PRN, Newman Pies, MD .  metoprolol tartrate (LOPRESSOR) tablet 12.5 mg,  12.5 mg, Oral, BID, Stretch, Marily Lente, MD, 12.5 mg at 10/03/19 1000 .  morphine 4 MG/ML injection 4 mg, 4 mg, Intravenous, Q2H PRN, Newman Pies, MD, 4 mg at 10/03/19 0043 .  ondansetron (ZOFRAN) tablet 4 mg, 4 mg, Oral, Q6H PRN **OR** ondansetron (ZOFRAN) injection 4 mg, 4 mg, Intravenous, Q6H PRN, Newman Pies, MD, 4 mg at 10/01/19 1040 .  oxyCODONE (Oxy IR/ROXICODONE) immediate release tablet 10 mg, 10 mg, Oral, Q3H PRN, Newman Pies, MD, 10 mg at 10/03/19 0959 .  oxyCODONE (Oxy IR/ROXICODONE) immediate release tablet 5 mg, 5 mg, Oral, Q3H PRN, Newman Pies, MD, 5 mg at 10/02/19 0730 .  pantoprazole (PROTONIX) EC tablet 40 mg, 40 mg, Oral, Daily, Newman Pies, MD, 40 mg at 10/03/19 0959 .  sertraline (ZOLOFT) tablet 50 mg, 50 mg, Oral, Daily, Newman Pies, MD, 50 mg at 10/03/19 1000 .  spironolactone (ALDACTONE) tablet 25 mg, 25 mg, Oral, Daily, Newman Pies, MD, 25 mg at 10/03/19 1000 .  tamsulosin (FLOMAX) capsule 0.8 mg, 0.8 mg, Oral, Daily, Newman Pies, MD, 0.8 mg at 10/03/19  6803  Patients Current Diet:  Diet Order            Diet regular Room service appropriate? Yes; Fluid consistency: Thin  Diet effective now              Precautions / Restrictions Precautions Precautions: Cervical Precaution Booklet Issued: Yes (comment) Precaution Comments: verbal discussion of handout and precautions Cervical Brace: Hard collar Restrictions Weight Bearing Restrictions: No   Has the patient had 2 or more falls or a fall with injury in the past year? No  Prior Activity Level Limited Community (1-2x/wk): Independent and driving pta  Prior Functional Level Self Care: Did the patient need help bathing, dressing, using the toilet or eating? Needed some help  Indoor Mobility: Did the patient need assistance with walking from room to room (with or without device)? Needed some help  Stairs: Did the patient need assistance with internal or external stairs (with or without device)? Needed some help  Functional Cognition: Did the patient need help planning regular tasks such as shopping or remembering to take medications? Independent  Home Assistive Devices / Equipment Home Assistive Devices/Equipment: None Home Equipment: Walker - 2 wheels, Cane - single point  Prior Device Use: Indicate devices/aids used by the patient prior to current illness, exacerbation or injury? None of the above  Current Functional Level Cognition  Overall Cognitive Status: Within Functional Limits for tasks assessed Orientation Level: Oriented X4 General Comments: Pt asking for Ativan s/p PT/OT session. RN aware.    Extremity Assessment (includes Sensation/Coordination)  Upper Extremity Assessment: Generalized weakness RUE Deficits / Details: poor grip strength; below shoulder movements, slow and decreased coordination; Pt AROM to 35* shoulder flex. RUE Coordination: decreased fine motor, decreased gross motor LUE Deficits / Details: AROM 45* shoulder flex. Pt with decreased  coordination and poor grip strength LUE Coordination: decreased fine motor, decreased gross motor  Lower Extremity Assessment: Generalized weakness    ADLs  Overall ADL's : Needs assistance/impaired Eating/Feeding: Set up, Sitting Grooming: Minimal assistance, Sitting, Standing Grooming Details (indicate cue type and reason): Assist for weakness in BUEs Upper Body Bathing: Moderate assistance, Sitting Lower Body Bathing: Maximal assistance, Cueing for safety Upper Body Dressing : Moderate assistance, Sitting Upper Body Dressing Details (indicate cue type and reason): Assist as arms are limited by poor coordination and weakness Lower Body Dressing: Maximal assistance, Sitting/lateral leans, Sit to/from stand Toilet Transfer: Minimal  assistance, +2 for physical assistance, +2 for safety/equipment, Ambulation, BSC, RW Toileting- Clothing Manipulation and Hygiene: Moderate assistance, Sitting/lateral lean, Sit to/from stand, Adhering to back precautions Functional mobility during ADLs: Minimal assistance, +2 for physical assistance, +2 for safety/equipment, Rolling walker, Cueing for safety General ADL Comments: Pt education on back precautions and reducing lean at sink.    Mobility  Overal bed mobility: Needs Assistance Bed Mobility: Supine to Sit, Sit to Sidelying, Sit to Supine Rolling: Min assist Sidelying to sit: Min assist Sit to supine: Mod assist, +2 for safety/equipment, +2 for physical assistance, HOB elevated Sit to sidelying: Max assist, +2 for physical assistance General bed mobility comments: MinA with use of rail for and trunk elevation; sitting EOB to supine, assist with BLEs.    Transfers  Overall transfer level: Needs assistance Equipment used: Rolling walker (2 wheeled) Transfers: Sit to/from Stand, Stand Pivot Transfers Sit to Stand: Min assist, Mod assist, +2 physical assistance, +2 safety/equipment Stand pivot transfers: Min assist, Mod assist, +2 physical  assistance, +2 safety/equipment, From elevated surface General transfer comment: Pt able to stand with RW for pivot and for ambulation with assist for steadying    Ambulation / Gait / Stairs / Wheelchair Mobility  Ambulation/Gait Ambulation/Gait assistance: Min assist, +2 safety/equipment Gait Distance (Feet): 20 Feet(10+10 to and from bathroom) Assistive device: Rolling walker (2 wheeled) Gait Pattern/deviations: Step-through pattern, Decreased stride length, Drifts right/left, Trunk flexed General Gait Details: Min assist for steadying, pt with 2 LOB during initial steps after standing requiring mod steadying assist at gait belt to correct. Verbal cuing for placement in RW, upright posture. Gait velocity: decr Gait velocity interpretation: <1.8 ft/sec, indicate of risk for recurrent falls    Posture / Balance Dynamic Sitting Balance Sitting balance - Comments: unable to accept challenge. Balance Overall balance assessment: Needs assistance Sitting-balance support: Feet supported, Single extremity supported Sitting balance-Leahy Scale: Fair Sitting balance - Comments: unable to accept challenge. Standing balance support: Bilateral upper extremity supported Standing balance-Leahy Scale: Poor Standing balance comment: reliant on external support    Special needs/care consideration BiPAP/CPAP  Used CPAP pta/[facility] C[facility]  Continuous Drip IV  Dialysis         Days  Life Vest  Oxygen O2 at 2 liters [place]; does not use O2 pta Special Bed  Trach Size  Wound Vac  Skin surgical incision                              Bowel mgmt: continent LBM 12/28 Bladder mgmt: external catheter Diabetic mgmt:  Behavioral consideration  Chemo/radiation  Designated visitor is wife, Hilda Blades   Previous Home Environment  Living Arrangements: Spouse/significant other  Lives With: Spouse Available Help at Discharge: Family, Available 24 hours/day Type of Home: House Home Layout: One level Home Access:  Stairs to enter Technical brewer of Steps: 1 Bathroom Shower/Tub: Multimedia programmer: Handicapped height Bathroom Accessibility: Yes How Accessible: Accessible via walker [place]: No  Discharge Living Setting Plans for Discharge Living Setting: Patient's home, Lives with (comment)(wife) Type of Home at Discharge: House Discharge Home Layout: One level Discharge Home Access: Stairs to enter Entrance Stairs-Number of Steps: 1 Discharge Bathroom Shower/Tub: Walk-in shower Discharge Bathroom Toilet: Handicapped height Discharge Bathroom Accessibility: Yes How Accessible: Accessible via walker Does the patient have any problems obtaining your medications?: No  Social/Family/Support Systems Patient Roles: Spouse Contact Information: wife, Hilda Blades Anticipated Caregiver: wife Anticipated Caregiver's Contact Information: see  above Ability/Limitations of Caregiver: no limitations Caregiver Availability: 24/7 Discharge Plan Discussed with Primary Caregiver: Yes Is Caregiver In Agreement with Plan?: Yes Does Caregiver/Family have Issues with Lodging/Transportation while Pt is in Rehab?: No  Goals/Additional Needs Patient/Family Goal for Rehab: Mod I to supervision with PT and OT Expected length of stay: ELOS 7 to 10 days Pt/Family Agrees to Admission and willing to participate: Yes Program Orientation Provided & Reviewed with Pt/Caregiver Including Roles  & Responsibilities: Yes  Decrease burden of Care through IP rehab admission:   Possible need for SNF placement upon discharge:   Patient Condition: I have reviewed medical records from [facility], spoken with CM, and patient and spouse. I met with patient at the bedside for inpatient rehabilitation assessment.  Patient will benefit from ongoing PT and OT, can actively participate in 3 hours of therapy a day 5 days of the week, and can make measurable gains during the admission.  Patient will also  benefit from the coordinated team approach during an Inpatient Acute Rehabilitation admission.  The patient will receive intensive therapy as well as Rehabilitation physician, nursing, social worker, and care management interventions.  Due to bladder management, bowel management, safety, skin/wound care, disease management, medication administration, pain management and patient education the patient requires 24 hour a day rehabilitation nursing.  The patient is currently min to mod assist with mobility and basic ADLs.  Discharge setting and therapy post discharge at home with home health is anticipated.  Patient has agreed to participate in the Acute Inpatient Rehabilitation Program and will admit today.  Preadmission Screen Completed By:  Cleatrice Burke, 10/03/2019 10:05 AM ______________________________________________________________________   Discussed status with Dr. Dagoberto Ligas on  10/03/2019 at 21 and received approval for admission today.  Admission Coordinator:  Cleatrice Burke, RN, time  1010 Date  10/03/2019   Assessment/Plan: Diagnosis: 1. Does the need for close, 24 hr/day Medical supervision in concert with the patient's rehab needs make it unreasonable for this patient to be served in a less intensive setting? Yes 2. Co-Morbidities requiring supervision/potential complications: CAD s/p CABG, A Fib, OSA- CPAP, diastolic CHF, resp distress 3. Due to bladder management, bowel management, safety, skin/wound care, disease management, medication administration, pain management and patient education, does the patient require 24 hr/day rehab nursing? Yes 4. Does the patient require coordinated care of a physician, rehab nurse, PT, OT, and SLP to address physical and functional deficits in the context of the above medical diagnosis(es)? Yes Addressing deficits in the following areas: balance, endurance, locomotion, strength, transferring, bathing, dressing, feeding, grooming and  toileting 5. Can the patient actively participate in an intensive therapy program of at least 3 hrs of therapy 5 days a week? Yes 6. The potential for patient to make measurable gains while on inpatient rehab is good 7. Anticipated functional outcomes upon discharge from inpatient rehab: modified independent and supervision PT, modified independent and supervision OT, n/a SLP 8. Estimated rehab length of stay to reach the above functional goals is: 7-10 days 9. Anticipated discharge destination: Home 10. Overall Rehab/Functional Prognosis: good   MD Signature:

## 2019-10-04 ENCOUNTER — Inpatient Hospital Stay (HOSPITAL_COMMUNITY): Payer: Medicare Other

## 2019-10-04 ENCOUNTER — Inpatient Hospital Stay (HOSPITAL_COMMUNITY): Payer: Medicare Other | Admitting: Occupational Therapy

## 2019-10-04 ENCOUNTER — Inpatient Hospital Stay (HOSPITAL_COMMUNITY): Payer: Medicare Other | Admitting: Physical Therapy

## 2019-10-04 DIAGNOSIS — G992 Myelopathy in diseases classified elsewhere: Secondary | ICD-10-CM

## 2019-10-04 DIAGNOSIS — M4802 Spinal stenosis, cervical region: Secondary | ICD-10-CM

## 2019-10-04 LAB — CBC WITH DIFFERENTIAL/PLATELET
Abs Immature Granulocytes: 1.5 10*3/uL — ABNORMAL HIGH (ref 0.00–0.07)
Basophils Absolute: 0.2 10*3/uL — ABNORMAL HIGH (ref 0.0–0.1)
Basophils Relative: 1 %
Eosinophils Absolute: 0.2 10*3/uL (ref 0.0–0.5)
Eosinophils Relative: 1 %
HCT: 42.3 % (ref 39.0–52.0)
Hemoglobin: 14.6 g/dL (ref 13.0–17.0)
Immature Granulocytes: 7 %
Lymphocytes Relative: 11 %
Lymphs Abs: 2.5 10*3/uL (ref 0.7–4.0)
MCH: 29.4 pg (ref 26.0–34.0)
MCHC: 34.5 g/dL (ref 30.0–36.0)
MCV: 85.1 fL (ref 80.0–100.0)
Monocytes Absolute: 2.1 10*3/uL — ABNORMAL HIGH (ref 0.1–1.0)
Monocytes Relative: 9 %
Neutro Abs: 15.3 10*3/uL — ABNORMAL HIGH (ref 1.7–7.7)
Neutrophils Relative %: 71 %
Platelets: 269 10*3/uL (ref 150–400)
RBC: 4.97 MIL/uL (ref 4.22–5.81)
RDW: 12.7 % (ref 11.5–15.5)
WBC: 21.7 10*3/uL — ABNORMAL HIGH (ref 4.0–10.5)
nRBC: 0 % (ref 0.0–0.2)

## 2019-10-04 LAB — COMPREHENSIVE METABOLIC PANEL
ALT: 37 U/L (ref 0–44)
AST: 20 U/L (ref 15–41)
Albumin: 2.8 g/dL — ABNORMAL LOW (ref 3.5–5.0)
Alkaline Phosphatase: 77 U/L (ref 38–126)
Anion gap: 15 (ref 5–15)
BUN: 32 mg/dL — ABNORMAL HIGH (ref 8–23)
CO2: 24 mmol/L (ref 22–32)
Calcium: 9.2 mg/dL (ref 8.9–10.3)
Chloride: 96 mmol/L — ABNORMAL LOW (ref 98–111)
Creatinine, Ser: 1.03 mg/dL (ref 0.61–1.24)
GFR calc Af Amer: >60 mL/min (ref >60)
GFR calc non Af Amer: >60 mL/min (ref >60)
Glucose, Bld: 129 mg/dL — ABNORMAL HIGH (ref 70–99)
Potassium: 3.7 mmol/L (ref 3.5–5.1)
Sodium: 135 mmol/L (ref 135–145)
Total Bilirubin: 0.6 mg/dL (ref 0.3–1.2)
Total Protein: 6.3 g/dL — ABNORMAL LOW (ref 6.5–8.1)

## 2019-10-04 NOTE — Evaluation (Signed)
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 Cervical myelopathy ([place]) 10/03/2019  . Myelopathy concurrent with and due to spinal stenosis of cervical region ([place]) 09/21/2019  . Paroxysmal atrial fibrillation ([place]) 05/24/2018  . Unstable angina ([place]) 02/04/2018  . Typical atrial flutter ([place])   . Spinal stenosis   . Sleep apnea   . QT prolongation   . Palpitations   . PAF (paroxysmal atrial fibrillation) ([place])   . OA (osteoarthritis)   . Hypertension   . HLD (hyperlipidemia)   . Edema   . DJD (degenerative joint disease)   . Coronary artery disease   . Chronic chest pain   . 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 A-fib ([place]) 09/15/2016  . Chest pain 09/09/2016  . Obesity 08/22/2016  . Anxiety 08/22/2016  . Encounter for monitoring sotalol therapy 08/20/2016  . Morbidly obese ([place]) 07/12/2016  . Medical non-compliance 07/12/2016  . Elevated TSH 07/09/2016  . BPH without urinary obstruction 07/09/2016  . Need for hepatitis C screening test 07/09/2016  . Idiopathic gout 07/09/2016  . Hyperglycemia 08/01/2015  . Atrial fibrillation with RVR ([place]) 03/28/2015  . Routine general medical examination at a health care facility 08/11/2013  . Chronic sinus bradycardia   . Obstructive sleep apnea 09/12/2010  . MYOCARDIAL INFARCTION 09/11/2010  . Spinal stenosis, unspecified region other than cervical 09/11/2010  . Hyperlipidemia with target LDL less than 70 09/08/2010  . Depression 09/08/2010  . Essential hypertension 09/08/2010  . Coronary atherosclerosis 09/08/2010  .  Congestive heart failure ([place]) 09/08/2010  . GERD 09/08/2010  . MI, old 10/05/2002    Past Medical History:  Past Medical History:  Diagnosis Date  . Anxiety   . CHF (congestive heart failure) (HCC)    h/o due to fluid overload in recovery room  . Coronary artery disease    2v CABG, 2004 [facility])  . COVID-19   . DJD (degenerative joint disease)   . Dyspnea    on exertion  . GERD (gastroesophageal reflux disease)   . History of kidney stones    3 stones yrs. ago  . HLD (hyperlipidemia)   . Hypertension   . MI, old 2004  . OA (osteoarthritis)   . Obesity   . Persistent atrial fibrillation ([place])    failed medical therapy with sotalol, s/p PVI x 2  . Sleep apnea    on CPAP  . Spinal stenosis   . Typical atrial flutter [facility])    Past Surgical History:  Past Surgical History:  Procedure Laterality Date  . ANTERIOR CERVICAL DECOMP/DISCECTOMY FUSION [name] 09/21/2019   Procedure: ANTERIOR CERVICAL DECOMPRESSION/DISCECTOMY FUSION CERVICAL THREE- CERVICAL FOUR, CERVICAL FOUR- CERVICAL FIVE, CERVICAL FIVE- CERVICAL SIX;  Surgeon: Newman Pies, MD;  Location: [place];  Service: Neurosurgery;  Laterality: [name];  ANTERIOR CERVICAL DECOMPRESSION/DISCECTOMY FUSION CERVICAL THREE- CERVICAL FOUR, CERVICAL FOUR- CERVICAL FIVE, CERVICAL FIVE- CERVICAL SIX  . ATRIAL FIBRILLATION ABLATION [name] 05/24/2018   Procedure: ATRIAL FIBRILLATION ABLATION;  Surgeon: Thompson Grayer, MD;  Location: [place] [facility];  Service: Cardiovascular;  Laterality: [name];  . BACK SURGERY  2011  .  CARPAL TUNNEL RELEASE     bilateral  . CHOLECYSTECTOMY    . CORONARY ARTERY BYPASS GRAFT     2004  . ELECTROPHYSIOLOGIC STUDY [name] 09/15/2016   Procedure: Atrial Fibrillation Ablation;  Surgeon: Thompson Grayer, MD;  Location: [place] [facility];  Service: Cardiovascular;  Laterality: [name];  . fistula surgery    . hemorrhoidectomy    . LEFT HEART CATH AND CORS/GRAFTS ANGIOGRAPHY [name] 02/07/2018   Procedure: LEFT HEART CATH AND  CORS/GRAFTS ANGIOGRAPHY;  Surgeon: Martinique, Peter M, MD;  Location: [place] [facility];  Service: Cardiovascular;  Laterality: [name];  . LUMBAR LAMINECTOMY    . right foot fracture    . TEE WITHOUT CARDIOVERSION [name] 09/15/2016   Procedure: TRANSESOPHAGEAL ECHOCARDIOGRAM (TEE);  Surgeon: Lelon Perla, MD;  Location: [place];  Service: Cardiovascular;  Laterality: [name];  . TOTAL KNEE ARTHROPLASTY Left 07/07/2017   Procedure: LEFT TOTAL KNEE ARTHROPLASTY;  Surgeon: Latanya Maudlin, MD;  Location: [facility];  Service: Orthopedics;  Laterality: Left;  . TRANSTHORACIC ECHOCARDIOGRAM  08/25/2010   EF 55-60%    Assessment & Plan Clinical Impression: Pt is a 68 yo male s/p ACDF C3-4, C4-5, C5-6 and plating.  12/18 developed respiratory distress and transferred to ICU.  Pt PMHx: CHF, MI, HTN, DJD, CAD,back sx, L TKA.  Patient currently requires min-max with basic self-care skills secondary to muscle weakness, decreased cardiorespiratoy endurance, impaired timing and sequencing, unbalanced muscle activation and decreased coordination, decreased safety awareness and decreased sitting balance, decreased standing balance, decreased postural control, decreased balance strategies and difficulty maintaining precautions.  Prior to hospitalization, patient could complete BADL/IADL with independent .  Patient will benefit from skilled intervention to increase independence with basic self-care skills prior to discharge home with care partner.  Anticipate patient will require intermittent supervision and follow up outpatient.  OT - End of Session Activity Tolerance: Tolerates 30+ min activity with multiple rests Endurance Deficit: Yes OT Assessment Rehab Potential (ACUTE ONLY): Good OT Barriers to Discharge: Incontinence;Weight OT Patient demonstrates impairments in the following area(s): Balance;Safety;Sensory;Endurance;Motor;Pain OT Basic ADL's Functional Problem(s):  Grooming;Eating;Bathing;Dressing;Toileting OT Transfers Functional Problem(s): Toilet;Tub/Shower OT Additional Impairment(s): Fuctional Use of Upper Extremity OT Plan OT Intensity: Minimum of 1-2 x/day, 45 to 90 minutes OT Frequency: 5 out of 7 days OT Duration/Estimated Length of Stay: 12-16 OT Treatment/Interventions: Discharge planning;Balance/vestibular training;Pain management;Self Care/advanced ADL retraining;Therapeutic Activities;UE/LE Coordination activities;Therapeutic Exercise;Skin care/wound managment;Patient/family education;Functional mobility training;Disease mangement/prevention;Community reintegration;DME/adaptive equipment instruction;Neuromuscular re-education;Psychosocial support;Splinting/orthotics;UE/LE Strength taining/ROM;Wheelchair propulsion/positioning OT Self Feeding Anticipated Outcome(s): MOD I feeding/grooming OT Basic Self-Care Anticipated Outcome(s): S OT Toileting Anticipated Outcome(s): S OT Bathroom Transfers Anticipated Outcome(s): S OT Recommendation Patient destination: Home Follow Up Recommendations: Outpatient OT Equipment Recommended: 3 in 1 bedside comode;Tub/shower seat   Skilled Therapeutic Intervention 1:1. Pt received in bed agreeable to ADL after education on role/purpose of OT/CIR/ELOS/and POC. Per order pt able ot walk to bathroom without collar. Pt completes bed mobility with HOB elevated and bed rails with CGA. Pt ambulates into bathroom with MIN A from EOB with pt insistant on using BUE on RW to push up despite education on safety/RW management. Pt transfers into toilet with MIN A and VC for RW management. Pt attempt to void bowel (-) and bladder (+) on toilet with S for sitting balance. Pt bathes sit to stand at sink with MOD A. Pt dons pull over shirt with MAX A to pull shirt up arms, overhead and down back and pants with MAX A at sit to stand level (MIN  A standing balance). Pt tearful throughout session embarrassed about needing A with ADLs,  however support and encouragement provided. Exited session with pt seated in bed call light in reach and all needs met and wife entering room  OT Evaluation Precautions/Restrictions  Precautions Precautions: Cervical Precaution Booklet Issued: Yes (comment) Precaution Comments: verbal discussion of handout and precautions Required Braces or Orthoses: Cervical Brace Cervical Brace: Hard collar Restrictions Weight Bearing Restrictions: No General Chart Reviewed: Yes Family/Caregiver Present: No Vital Signs Therapy Vitals Temp: (!) 97.5 F (36.4 C) Pulse Rate: 98 Resp: 18 BP: (!) 153/74 Patient Position (if appropriate): Sitting Oxygen Therapy SpO2: 99 % O2 Device: Nasal Cannula O2 Flow Rate (L/min): 7 L/min Pain Pain Assessment Pain Score: 0-No pain Home Living/Prior Functioning Home Living Available Help at Discharge: Family, Available 24 hours/day Home Access: Stairs to enter Technical brewer of Steps: 1 Home Layout: One level Bathroom Shower/Tub: Gaffer, Door [employer] Toilet: Handicapped height Bathroom Accessibility: Yes  Lives With: Spouse IADL History Homemaking Responsibilities: Yes Meal Prep Responsibility: No Laundry Responsibility: No Cleaning Responsibility: No Bill Paying/Finance Responsibility: Secondary Shopping Responsibility: No Child Care Responsibility: No Current License: Yes Mode of Transportation: Car Type of Occupation: electrician-retired Leisure and Hobbies: motorcycle; hunting; fishing all previoius hobbies Prior Function Level of Independence: Independent with basic ADLs, Independent with homemaking with ambulation Driving: Yes ADL   Vision Baseline Vision/History: Wears glasses Wears Glasses: Reading only Patient Visual Report: No change from baseline Vision Assessment?: No apparent visual deficits Perception  Perception: Within Functional Limits Praxis Praxis: Intact Cognition Overall Cognitive Status: Within  Functional Limits for tasks assessed Orientation Level: Person;Place;Situation Person: Oriented Place: Oriented Situation: Oriented Year: 2020 Month: December Day of Week: Correct Memory: Appears intact Immediate Memory Recall: Sock;Blue;Bed Memory Recall Sock: With Cue Memory Recall Blue: Without Cue Memory Recall Bed: Without Cue Awareness: Appears intact Problem Solving: Appears intact Sensation Sensation Light Touch: Impaired by gross assessment(intermittent LUE numbness) Coordination Gross Motor Movements are Fluid and Coordinated: No Fine Motor Movements are Fluid and Coordinated: No Motor  Motor Motor: Tetraplegia Mobility  Bed Mobility Bed Mobility: Sitting - Scoot to Edge of Bed Sitting - Scoot to [employer] of Bed: Supervision/Verbal cueing Transfers Sit to Stand: Minimal Assistance - Patient > 75% Stand to Sit: Minimal Assistance - Patient > 75%  Trunk/Postural Assessment  Cervical Assessment Cervical Assessment: Exceptions to WFL(collar/precautions) Thoracic Assessment Thoracic Assessment: Exceptions to WFL(rounded shoulders) Lumbar Assessment Lumbar Assessment: Exceptions to WFL(post pelvic tilt) Postural Control Postural Control: Deficits on evaluation(delayed/insufficient)  Balance Balance Balance Assessed: Yes Dynamic Sitting Balance Dynamic Sitting - Balance Support: Feet supported Dynamic Sitting - Level of Assistance: 5: Stand by assistance Dynamic Standing Balance Dynamic Standing - Balance Support: During functional activity Dynamic Standing - Level of Assistance: 4: Min assist Extremity/Trunk Assessment RUE Assessment RUE Assessment: Exceptions to [facility] General Strength Comments: 0-80* shoulder flex; full elbow/digit; generalized weakness LUE Assessment LUE Assessment: Exceptions to [facility] General Strength Comments: 0-70* shoulder flex; full elbow, decreased digit opposition; generalized weakness     Refer to Care Plan for Long Term  Goals  Recommendations for other services: Neuropsych and Therapeutic Recreation  Pet therapy, Stress management and Outing/community reintegration    Discharge Criteria: Patient will be discharged from OT if patient refuses treatment 3 consecutive times without medical reason, if treatment goals not met, if there is a change in medical status, if patient makes no progress towards goals or if patient is discharged from hospital.  The above assessment, treatment plan, treatment  alternatives and goals were discussed and mutually agreed upon: by patient  Tonny Branch 10/04/2019, 9:32 AM

## 2019-10-04 NOTE — Evaluation (Signed)
Physical Therapy Assessment and Plan  Patient Details  Name: [name] [name] MRN: [id number] Date of Birth: 12/08/1950  PT Diagnosis: Abnormality of gait, Difficulty walking, Impaired sensation, Low back pain and Quadriplegia Rehab Potential: Good ELOS: 10-14 days   Today's Date: 10/04/2019 PT Individual Time: 1300-1405 PT Individual Time Calculation (min): 65 min   PT Amount of Missed Time (min): 10 Minutes PT Missed Treatment Reason: Patient fatigue   Problem List:  Patient Active Problem List   Diagnosis Date Noted  . Cervical myelopathy ([place]) 10/03/2019  . Myelopathy concurrent with and due to spinal stenosis of cervical region ([place]) 09/21/2019  . Paroxysmal atrial fibrillation ([place]) 05/24/2018  . Unstable angina ([place]) 02/04/2018  . Typical atrial flutter ([place])   . Spinal stenosis   . Sleep apnea   . QT prolongation   . Palpitations   . PAF (paroxysmal atrial fibrillation) ([place])   . OA (osteoarthritis)   . Hypertension   . HLD (hyperlipidemia)   . Edema   . DJD (degenerative joint disease)   . Coronary artery disease   . Chronic chest pain   . 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 A-fib ([place]) 09/15/2016  . Chest pain 09/09/2016  . Obesity 08/22/2016  . Anxiety 08/22/2016  . Encounter for monitoring sotalol therapy 08/20/2016  . Morbidly obese ([place]) 07/12/2016  . Medical non-compliance 07/12/2016  . Elevated TSH 07/09/2016  . BPH without urinary obstruction 07/09/2016  . Need for hepatitis C screening test 07/09/2016  . Idiopathic gout 07/09/2016  . Hyperglycemia 08/01/2015  . Atrial fibrillation with RVR ([place]) 03/28/2015  . Routine general medical examination at a health care facility 08/11/2013  . Chronic sinus bradycardia   . Obstructive sleep apnea 09/12/2010  . MYOCARDIAL INFARCTION 09/11/2010  . Spinal stenosis, unspecified region other than cervical 09/11/2010  . Hyperlipidemia with target LDL less than 70 09/08/2010  . Depression 09/08/2010   . Essential hypertension 09/08/2010  . Coronary atherosclerosis 09/08/2010  . Congestive heart failure ([place]) 09/08/2010  . GERD 09/08/2010  . MI, old 10/05/2002    Past Medical History:  Past Medical History:  Diagnosis Date  . Anxiety   . CHF (congestive heart failure) (HCC)    h/o due to fluid overload in recovery room  . Coronary artery disease    2v CABG, 2004 [facility])  . COVID-19   . DJD (degenerative joint disease)   . Dyspnea    on exertion  . GERD (gastroesophageal reflux disease)   . History of kidney stones    3 stones yrs. ago  . HLD (hyperlipidemia)   . Hypertension   . MI, old 2004  . OA (osteoarthritis)   . Obesity   . Persistent atrial fibrillation ([place])    failed medical therapy with sotalol, s/p PVI x 2  . Sleep apnea    on CPAP  . Spinal stenosis   . Typical atrial flutter [facility])    Past Surgical History:  Past Surgical History:  Procedure Laterality Date  . ANTERIOR CERVICAL DECOMP/DISCECTOMY FUSION [name] 09/21/2019   Procedure: ANTERIOR CERVICAL DECOMPRESSION/DISCECTOMY FUSION CERVICAL THREE- CERVICAL FOUR, CERVICAL FOUR- CERVICAL FIVE, CERVICAL FIVE- CERVICAL SIX;  Surgeon: Newman Pies, MD;  Location: [place];  Service: Neurosurgery;  Laterality: [name];  ANTERIOR CERVICAL DECOMPRESSION/DISCECTOMY FUSION CERVICAL THREE- CERVICAL FOUR, CERVICAL FOUR- CERVICAL FIVE, CERVICAL FIVE- CERVICAL SIX  . ATRIAL FIBRILLATION ABLATION [name] 05/24/2018   Procedure: ATRIAL FIBRILLATION ABLATION;  Surgeon: Thompson Grayer, MD;  Location: [place] [facility];  Service: Cardiovascular;  Laterality: [name];  . BACK SURGERY  2011  . CARPAL TUNNEL RELEASE     bilateral  . CHOLECYSTECTOMY    . CORONARY ARTERY BYPASS GRAFT     2004  . ELECTROPHYSIOLOGIC STUDY [name] 09/15/2016   Procedure: Atrial Fibrillation Ablation;  Surgeon: Thompson Grayer, MD;  Location: [place] [facility];  Service: Cardiovascular;  Laterality: [name];  . fistula surgery    . hemorrhoidectomy    . LEFT HEART CATH  AND CORS/GRAFTS ANGIOGRAPHY [name] 02/07/2018   Procedure: LEFT HEART CATH AND CORS/GRAFTS ANGIOGRAPHY;  Surgeon: Martinique, Peter M, MD;  Location: [place] [facility];  Service: Cardiovascular;  Laterality: [name];  . LUMBAR LAMINECTOMY    . right foot fracture    . TEE WITHOUT CARDIOVERSION [name] 09/15/2016   Procedure: TRANSESOPHAGEAL ECHOCARDIOGRAM (TEE);  Surgeon: Lelon Perla, MD;  Location: [place];  Service: Cardiovascular;  Laterality: [name];  . TOTAL KNEE ARTHROPLASTY Left 07/07/2017   Procedure: LEFT TOTAL KNEE ARTHROPLASTY;  Surgeon: Latanya Maudlin, MD;  Location: [facility];  Service: Orthopedics;  Laterality: Left;  . TRANSTHORACIC ECHOCARDIOGRAM  08/25/2010   EF 55-60%    Assessment & Plan Clinical Impression:  [name] [name] is a 68 year old right-handed male with history of CAD status post CABG 2004, lumbar laminectomy, left TKA 2018, hypertension, diastolic congestive heart failure, anxiety, atrial fibrillation with ablation August 2019 and maintained on Eliquis, sleep apnea with CPAP. Per chart review patient lives with spouse. 1 level home one-step to entry. Presented September 21, 2019 with severe neck pain and quadriparesis as well as gait instability. X-rays and imaging demonstrated a large herniated disc at C4-5 with severe spinal stenosis, cervical myelopathy and spinal cord signal change. He had moderate stenosis at C3-4 and 5-6. Underwent C3-4, 4-5 and C5-6 anterior cervical discectomy decompression, C3-4 4-55-6 interbody arthrodesis with insertion of interbody prosthesis and anterior cervical plating from C3-C6 with globus titanium plate September 20, 8298 per Dr. Arnoldo Morale. Cervical brace as directed. Noted on September 23, 2019 rapid response contacted for respiratory distress requiring NRB 15 L saturations 95%. There was some reported swelling at the surgical site suspect hematoma. Chest x-ray showed mild pulmonary vascular congestion with possible small right pleural effusion. He was  transferred to ICU follow-up per critical care medicine. Patient did improve with the use of CPAP however after taking off CPAP the following morning again with increasing shortness of breath received albuterol and Decadron as advised and changed to BiPAP. Sniff test 10/02/2019 showed no diaphragmatic paralysis. Patient also remained on intravenous Lopressor as well as Lasix with noted history of hypertension atrial fibrillation and intravenous heparin was initiated and plan is to resume Eliquis as prior to admission. Patient did not require intubation and improved with ongoing respiratory treatment. Lower extremity Dopplers completed negative for DVT. Noted leukocytosis 28,700 felt to be secondary to steroids which have been tapered to off. Surgical site follow-up per neurosurgery did not appear to show any signs of infection and follow-up imaging demonstrates prevertebral swelling largely resolved. Mild AKI with creatinine 1.45 and Lasix has been discontinued. Patient is tolerating a regular diet. Therapy evaluations completed and patient was admitted for a comprehensive rehab program. Patient transferred to CIR on 10/03/2019 .   Patient currently requires mod with mobility secondary to muscle weakness, decreased cardiorespiratoy endurance and decreased oxygen support, abnormal tone and unbalanced muscle activation and decreased standing balance, decreased postural control and decreased balance strategies.  Prior to hospitalization, patient was independent  with mobility  and lived with Spouse in a House home.  Home access is 1Stairs to enter.  Patient will benefit from skilled PT intervention to maximize safe functional mobility, minimize fall risk and decrease caregiver burden for planned discharge home with 24 hour supervision.  Anticipate patient will benefit from follow up OP at discharge.  PT - End of Session Activity Tolerance: Tolerates 30+ min activity with multiple rests Endurance Deficit:  Yes Endurance Deficit Description: frequent rest breaks during functional activity PT Assessment Rehab Potential (ACUTE/IP ONLY): Good PT Barriers to Discharge: New oxygen PT Patient demonstrates impairments in the following area(s): Balance;Endurance;Safety;Sensory PT Transfers Functional Problem(s): Bed Mobility;Bed to Chair;Car;Furniture;Floor PT Locomotion Functional Problem(s): Ambulation;Wheelchair Mobility;Stairs PT Plan PT Intensity: Minimum of 1-2 x/day ,45 to 90 minutes PT Frequency: 5 out of 7 days PT Duration Estimated Length of Stay: 10-14 days PT Treatment/Interventions: Ambulation/gait training;Balance/vestibular training;Community reintegration;Discharge planning;DME/adaptive equipment instruction;Functional mobility training;Neuromuscular re-education;Pain management;Patient/family education;Psychosocial support;Stair training;Therapeutic Activities;Therapeutic Exercise;UE/LE Strength taining/ROM;UE/LE Coordination activities PT Transfers Anticipated Outcome(s): Supervision PT Locomotion Anticipated Outcome(s): Supervision with LRAD PT Recommendation Recommendations for Other Services: Neuropsych consult;Therapeutic Recreation consult Therapeutic Recreation Interventions: Stress management Follow Up Recommendations: Outpatient PT Patient destination: Home Equipment Recommended: Rolling walker with 5" wheels  Skilled Therapeutic Intervention Evaluation completed (see details above and below) with education on PT POC and goals and individual treatment initiated with focus on functional transfer and gait assessment, orientation to rehab POC and goals, ELOS, etc. Pt received seated in w/c in room, agreeable to PT session but does report feeling fatigued this PM. No initial complaints of pain but does report onset of low back pain at end of session. Provided moist hot pack to patient at end of session for pain management. Returned after 15 min to assess pain relief. Pt reports good  relief of pain with use of hot pack, no redness noted to skin following use. Pt is min A to stand to RW. Ambulation x 50 ft with RW and min A, mildly unsteady with RW. Pt also requires v/c for safe RW management and hand placement during transfer. Ascend/descend 5 x 3" stairs with 2 handrails and min A for balance. Patient demonstrates increased fall risk as noted by score of 19/56 on Berg Balance Scale.  (<36= high risk for falls, close to 100%; 37-45 significant >80%; 46-51 moderate >50%; 52-55 lower >25%). Reviewed results of balance test and functional implications for fall risk. Pt has onset of fatigue during session and requests to return to his room. Pt requests to use bathroom before laying down. Toilet transfer with mod A and use of RW and grab bar, min A for clothing management, independent with pericare. Sit to long-sit in bed with min A. Pt requesting to don C-pap machine, RT notified and able to come assist pt with donning machine. Pt missed 10 min of scheduled therapy session due to fatigue. Pt left seated in bed with needs in reach, bed alarm in place, wife present at end of session.  PT Evaluation Precautions/Restrictions Precautions Precautions: Cervical Precaution Comments: verbal discussion of precautions Required Braces or Orthoses: Cervical Brace Cervical Brace: Hard collar Restrictions Weight Bearing Restrictions: No General PT Amount of Missed Time (min): 10 Minutes PT Missed Treatment Reason: Patient fatigue  Home Living/Prior Functioning Home Living Available Help at Discharge: Family;Available 24 hours/day Type of Home: House Home Access: Stairs to enter [employer] of Steps: 1 Entrance Stairs-Rails: None Home Layout: One level  Lives With: Spouse Prior Function Level of Independence: Independent with gait;Independent with  transfers  Able to Take Stairs?: Yes Driving: Yes Vision/Perception  Perception Perception: Within Functional  Limits Praxis Praxis: Intact  Cognition Overall Cognitive Status: Within Functional Limits for tasks assessed Arousal/Alertness: Awake/alert Orientation Level: Oriented X4 Attention: Focused Focused Attention: Appears intact Memory: Appears intact Awareness: Appears intact Problem Solving: Appears intact Behaviors: Lability Safety/Judgment: Appears intact Comments: tearful with regards to functional level Sensation Sensation Light Touch: Impaired by gross assessment(intermittent numbness in B fingers and LE) Proprioception: Appears Intact Coordination Gross Motor Movements are Fluid and Coordinated: No Fine Motor Movements are Fluid and Coordinated: No Coordination and Movement Description: impaired 2/2 generalized weakness and debility as well as tetraplegia Motor  Motor Motor: Tetraplegia;Abnormal tone;Abnormal postural alignment and control Motor - Skilled Clinical Observations: tetraplegia  Mobility Bed Mobility Bed Mobility: Rolling Right;Rolling Left;Supine to Sit;Sit to Supine Rolling Right: Minimal Assistance - Patient > 75% Rolling Left: Minimal Assistance - Patient > 75% Supine to Sit: Minimal Assistance - Patient > 75% Sitting - Scoot to Edge of Bed: Minimal Assistance - Patient > 75% Sit to Supine: Minimal Assistance - Patient > 75% Transfers Transfers: Sit to Stand;Stand to Sit Sit to Stand: Minimal Assistance - Patient > 75% Stand to Sit: Minimal Assistance - Patient > 75% Stand Pivot Transfers: Minimal Assistance - Patient > 75% Stand Pivot Transfer Details: Verbal cues for technique;Verbal cues for precautions/safety;Verbal cues for safe use of DME/AE Transfer (Assistive device): Rolling walker Locomotion  Gait Gait Distance (Feet): 50 Feet Assistive device: Rolling walker Gait Gait Pattern: Impaired Gait velocity: decreased Stairs / Additional Locomotion Stairs: Yes Stairs Assistance: Minimal Assistance - Patient > 75% Stair Management Technique:  Two rails;Alternating pattern Number of Stairs: 5 Height of Stairs: 3 Wheelchair Mobility Wheelchair Mobility: No  Trunk/Postural Assessment  Cervical Assessment Cervical Assessment: Exceptions to WFL(cervical precautions; Aspen collar) Thoracic Assessment Thoracic Assessment: Exceptions to WFL(rounded shoulders) Lumbar Assessment Lumbar Assessment: Exceptions to WFL(posterior pelvic tilt) Postural Control Postural Control: Deficits on evaluation(delayed/insufficient)  Balance Balance Balance Assessed: Yes Standardized Balance Assessment Standardized Balance Assessment: Berg Balance Test Berg Balance Test Sit to Stand: Needs minimal aid to stand or to stabilize Standing Unsupported: Able to stand 2 minutes with supervision Sitting with Back Unsupported but Feet Supported on Floor or Stool: Able to sit safely and securely 2 minutes Stand to Sit: Sits safely with minimal use of hands Transfers: Needs one person to assist Standing Unsupported with Eyes Closed: Able to stand 10 seconds with supervision Standing Ubsupported with Feet Together: Needs help to attain position but able to stand for 30 seconds with feet together From Standing, Reach Forward with Outstretched Arm: Loses balance while trying/requires external support From Standing Position, Pick up Object from Floor: Unable to try/needs assist to keep balance From Standing Position, Turn to Look Behind Over each Shoulder: Needs supervision when turning Turn 360 Degrees: Needs assistance while turning Standing Unsupported, Alternately Place Feet on Step/Stool: Needs assistance to keep from falling or unable to try Standing Unsupported, One Foot in Front: Needs help to step but can hold 15 seconds Standing on One Leg: Unable to try or needs assist to prevent fall Total Score: 19 Dynamic Sitting Balance Dynamic Sitting - Balance Support: Feet supported Dynamic Sitting - Level of Assistance: 5: Stand by assistance Dynamic  Standing Balance Dynamic Standing - Balance Support: During functional activity;Bilateral upper extremity supported Dynamic Standing - Level of Assistance: 4: Min assist Extremity Assessment      RLE Assessment RLE Assessment: Within Functional Limits General Strength Comments: 5/5  grossly LLE Assessment LLE Assessment: Within Functional Limits General Strength Comments: 4/5 grossly    Refer to Care Plan for Long Term Goals  Recommendations for other services: Neuropsych and Therapeutic Recreation  Stress management  Discharge Criteria: Patient will be discharged from PT if patient refuses treatment 3 consecutive times without medical reason, if treatment goals not met, if there is a change in medical status, if patient makes no progress towards goals or if patient is discharged from hospital.  The above assessment, treatment plan, treatment alternatives and goals were discussed and mutually agreed upon: by patient and by family   Excell Seltzer, PT, DPT 10/04/2019, 3:53 PM

## 2019-10-04 NOTE — Discharge Instructions (Signed)
Inpatient Rehab Discharge Instructions  [name] [name] Discharge date and time: No discharge date for patient encounter.   Activities/Precautions/ Functional Status: Activity: Cervical collar as directed Diet: regular diet Wound Care: keep wound clean and dry Functional status:  ___ No restrictions     ___ Walk up steps independently ___ 24/7 supervision/assistance   ___ Walk up steps with assistance ___ Intermittent supervision/assistance  ___ Bathe/dress independently ___ Walk with walker     _x__ Bathe/dress with assistance ___ Walk Independently    ___ Shower independently ___ Walk with assistance    ___ Shower with assistance ___ No alcohol     ___ Return to work/school ________  Special Instructions: No driving smoking or alcohol   My questions have been answered and I understand these instructions. I will adhere to these goals and the provided educational materials after my discharge from the hospital.  Patient/Caregiver Signature _______________________________ Date __________  Clinician Signature _______________________________________ Date __________  Please bring this form and your medication list with you to all your follow-up doctor's appointments.  -------------------------------------------------------------------------------------------------------------- Information on my medicine - ELIQUIS (apixaban)  This medication education was reviewed with me or my healthcare representative as part of my discharge preparation.  The pharmacist that spoke with me during my hospital stay was:  Donnamae Jude, [facility]  Why was Eliquis prescribed for you? Eliquis was prescribed for you to reduce the risk of a blood clot forming that can cause a stroke if you have a medical condition called atrial fibrillation (a type of irregular heartbeat).  What do You need to know about Eliquis ? Take your Eliquis TWICE DAILY - one tablet in the morning and one tablet in the evening with or  without food. If you have difficulty swallowing the tablet whole please discuss with your pharmacist how to take the medication safely.  Take Eliquis exactly as prescribed by your doctor and DO NOT stop taking Eliquis without talking to the doctor who prescribed the medication.  Stopping may increase your risk of developing a stroke.  Refill your prescription before you run out.  After discharge, you should have regular check-up appointments with your healthcare provider that is prescribing your Eliquis.  In the future your dose may need to be changed if your kidney function or weight changes by a significant amount or as you get older.  What do you do if you miss a dose? If you miss a dose, take it as soon as you remember on the same day and resume taking twice daily.  Do not take more than one dose of ELIQUIS at the same time to make up a missed dose.  Important Safety Information A possible side effect of Eliquis is bleeding. You should call your healthcare provider right away if you experience any of the following: ? Bleeding from an injury or your nose that does not stop. ? Unusual colored urine (red or dark brown) or unusual colored stools (red or black). ? Unusual bruising for unknown reasons. ? A serious fall or if you hit your head (even if there is no bleeding).  Some medicines may interact with Eliquis and might increase your risk of bleeding or clotting while on Eliquis. To help avoid this, consult your healthcare provider or pharmacist prior to using any new prescription or non-prescription medications, including herbals, vitamins, non-steroidal anti-inflammatory drugs (NSAIDs) and supplements.  This website has more information on Eliquis (apixaban): http://www.eliquis.com/eliquis/home

## 2019-10-04 NOTE — Progress Notes (Signed)
[place] PHYSICAL MEDICINE & REHABILITATION PROGRESS NOTE   Subjective/Complaints:  Pt reports no BM yet- nursing reports received a lot of prn bowel meds last night- concerned this might signal neurogenic bowel issues.  Pt also admits is still coughing a lotte Notes BiPAP was great, however CPAP last night didn't fit as well was very loose and wanted to just do BiPAP at home- explained we are unable to switch him- have to find a way to get CPAP to fit correctly- asked nursing to call resp therapy.  Also, nursing asking for continuous pulse ox.   ROS- pt denied SOB, CP, HA, did report some constipation, but denied N/V/D; also denies abd pain. Objective:   DG Sniff Test  Result Date: 10/02/2019 CLINICAL DATA:  Acute respiratory failure EXAM: CHEST FLUOROSCOPY TECHNIQUE: Real-time fluoroscopic evaluation of the chest was performed. FLUOROSCOPY TIME:  Fluoroscopy Time:  1 minutes Radiation Exposure Index (if provided by the fluoroscopic device): 3.7 mGy Number of Acquired Spot Images: 0 COMPARISON:  None other than chest x-ray evaluations and previous CTs. FINDINGS: Limited motion of the left and right hemidiaphragm with maximal inspiratory effort. Lung volumes remain low. Changes of median sternotomy and CABG. IMPRESSION: Limited motion with low volume chest of the right and left hemidiaphragm. No signs of paradoxical motion to suggest diaphragmatic paralysis. Electronically Signed   By: Donzetta Kohut M.D.   On: 10/02/2019 13:53   DG CHEST PORT 1 VIEW  Result Date: 10/03/2019 CLINICAL DATA:  Acute respiratory failure. EXAM: PORTABLE CHEST 1 VIEW COMPARISON:  Radiograph 09/30/2019. FINDINGS: Persistent low lung volumes. Post median sternotomy. Bibasilar opacities with blunting of the costophrenic angles, unchanged from prior. No pulmonary edema or pneumothorax. Surgical hardware in the lower cervical spine is partially included. IMPRESSION: No significant change from prior exam with low lung  volumes. Bibasilar opacities likely combination of atelectasis and small pleural effusions. Electronically Signed   By: Narda Rutherford M.D.   On: 10/03/2019 06:23   Recent Labs    10/03/19 0537 10/04/19 0523  WBC 28.7* 21.7*  HGB 15.1 14.6  HCT 43.8 42.3  PLT 329 269   Recent Labs    10/03/19 0537 10/04/19 0523  NA 135 135  K 3.8 3.7  CL 97* 96*  CO2 26 24  GLUCOSE 141* 129*  BUN 40* 32*  CREATININE 1.31* 1.03  CALCIUM 9.2 9.2    Intake/Output Summary (Last 24 hours) at 10/04/2019 1308 Last data filed at 10/04/2019 1254 Gross per 24 hour  Intake 1580 ml  Output 1200 ml  Net 380 ml     Physical Exam: Vital Signs Blood pressure (!) 153/74, pulse 98, temperature (!) 97.5 F (36.4 C), resp. rate 18, SpO2 99 %.  Constitutional:  Obese, barrel chested male on bed; supine; just finished breakfast, On O2 high flow by [place]- 5-6L, NAD  HENT:  Head: Atraumatic.  Mouth/Throat: Oropharynx is clear but a little dry due to O2 Eyes:  EOM are normal.  Neck:  Incision on anterior neck-, but appears to be glued- a lot of bruising and ecchymosis  Cardiovascular:RRR  Respiratory: CTA B/L  GI:  Protuberant, ND per pt, NT, (+) hypoactive BS  Genitourinary: Genitourinary Comments: Condom cath  Musculoskeletal:  Comments: RUE bicep 4+/5, WE 4+/5, triceps 4+/5, grip 4+/5, and finger abd 4+/5 LUE- biceps 3+/5, WE 3+/5, triceps 3/5, grip 3/5, finger abd 3-/5 RLE- HF 5-/5, KE 4+/5, DF 5-/5, PF 5-/5, EHL 4+/5 LLE- HF 4-/5, KE 4/5, DF 4-/5, PF 4/5, EHL  4-/5  Neurological: . Oriented x3. Hoffman's B/L in UEs- no clonus B/L Decreased sensation to light touch L C5-T1 otherwise intact to light touch in all 3 other extremities and torso B/L Skin:  Surgical site neck hematoma resolving. Noted ecchymosis at site. 2 IVs in RUE- no signs of infiltration/etc  Psychiatric:  Anxious but less so than yesterday    Assessment/Plan: 1. 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  Physiatrist is providing close team supervision and 24 hour management of active medical problems listed below.  Physiatrist and rehab team continue to assess barriers to discharge/monitor patient progress toward functional and medical goals  Care Tool:  Bathing    Body parts bathed by patient: Left arm, Chest, Abdomen, Front perineal area, Right upper leg, Left upper leg, Face   Body parts bathed by helper: Right arm, Buttocks, Right lower leg, Left lower leg     Bathing assist Assist Level: Moderate Assistance - Patient 50 - 74%     Upper Body Dressing/Undressing Upper body dressing   What is the patient wearing?: Pull over shirt    Upper body assist Assist Level: Maximal Assistance - Patient 25 - 49%    Lower Body Dressing/Undressing Lower body dressing      What is the patient wearing?: Pants     Lower body assist Assist for lower body dressing: Maximal Assistance - Patient 25 - 49%     Toileting Toileting    Toileting assist Assist for toileting: Minimal Assistance - Patient > 75%     Transfers Chair/bed transfer  Transfers assist     Chair/bed transfer assist level: Minimal Assistance - Patient > 75%     Locomotion Ambulation   Ambulation assist              Walk 10 feet activity   Assist           Walk 50 feet activity   Assist           Walk 150 feet activity   Assist           Walk 10 feet on uneven surface  activity   Assist           Wheelchair     Assist               Wheelchair 50 feet with 2 turns activity    Assist            Wheelchair 150 feet activity     Assist          Blood pressure (!) 153/74, pulse 98, temperature (!) 97.5 F (36.4 C), resp. rate 18, SpO2 99 %. Problem List and Plan:  1. 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 Soft Cervical collar when OOB  -patient may shower if cover cervical incision  -ELOS/Goals: 16-20 days/Goals Supervision- CGA  2. Antithrombotics:  -DVT/anticoagulation: Resume Eliquis 5 mg twice daily as prior to admission; off heparin gtt  -antiplatelet therapy: [name]  3. Pain Management: Flexeril and oxycodone as needed  4. Mood: Zoloft 50 mg daily, Klonopin 0.25 mg twice daily- will check if was changed from dose at home;  -antipsychotic agents: [name]  5. Neuropsych: This patient is capable of making decisions on his own behalf.  6. Skin/Wound Care:  Routine skin checks  7. Fluids/Electrolytes/Nutrition: Routine in and outs with follow-up chemistries  8. History of atrial fibrillation. Patient transitioned from intravenous heparin back to chronic Eliquis. Cardiac rate controlled. Continue Cardizem 100 mg daily, Lopressor 12.5 mg twice daily  9. Hypertension. Aldactone 25 mg daily, hydralazine 75 mg 3 times daily. Monitor with increased mobility  10. History of CAD with CABG 2004. No complaints of increasing chest pain.  11. Leukocytosis. Felt to be steroid induced. Prednisone taper completed. Follow-up CBC   12/30- down to 21.7 form 28k 12. Diastolic congestive heart failure. Monitor for any signs of fluid overload   12/30- no weights yet 13. OSA. Currently on BiPAP.   12/30- back on CPAP last night- will ask reps therapy to address loose fitting CPAP. 14. Hyperlipidemia. Lipitor  15. BPH and neurogenic bladder. Flomax 0.8 mg daily. Check PVRs for 3 days and cath if necessary  16. Constipation/Neurogenic bowel. Laxative assistance; will clean out and see if has control of stool and if not, will put on bowel program.  17. GERD. Protonix  18. Anxiety- Will make sure on home doses of Benzo. 19. Respiratory insufficiency  12/30- on  5-6L high flow O2- will wean as tolerated  - will order continuous pulse ox for now. 20. Azotemia  12/30- BUN is 32 down form 40- will monitor; Cr 1.03 down from 1.32     LOS: 1 days A FACE TO FACE EVALUATION WAS PERFORMED  Callan Yontz 10/04/2019, 1:08 PM

## 2019-10-04 NOTE — Progress Notes (Signed)
Inpatient Rehabilitation  Patient information reviewed and entered into eRehab system by Toye Rouillard M. Cephus Tupy, M.A., CCC/SLP, PPS Coordinator.  Information including medical coding, functional ability and quality indicators will be reviewed and updated through discharge.    

## 2019-10-04 NOTE — H&P (Signed)
Physical Medicine and Rehabilitation Admission H&P  HPI: [name] [name] is a 68 year old right-handed male with history of CAD status post CABG 2004, lumbar laminectomy, left TKA 2018, hypertension, diastolic congestive heart failure, anxiety, atrial fibrillation with ablation August 2019 and maintained on Eliquis, sleep apnea with CPAP. Per chart review patient lives with spouse. 1 level home one-step to entry. Presented September 21, 2019 with severe neck pain and quadriparesis as well as gait instability. X-rays and imaging demonstrated a large herniated disc at C4-5 with severe spinal stenosis, cervical myelopathy and spinal cord signal change. He had moderate stenosis at C3-4 and 5-6. Underwent C3-4, 4-5 and C5-6 anterior cervical discectomy decompression, C3-4 4-55-6 interbody arthrodesis with insertion of interbody prosthesis and anterior cervical plating from C3-C6 with globus titanium plate September 21, 2019 per Dr. Lovell Sheehan. Cervical brace as directed. Noted on September 23, 2019 rapid response contacted for respiratory distress requiring NRB 15 L saturations 95%. There was some reported swelling at the surgical site suspect hematoma. Chest x-ray showed mild pulmonary vascular congestion with possible small right pleural effusion. He was transferred to ICU follow-up per critical care medicine. Patient did improve with the use of CPAP however after taking off CPAP the following morning again with increasing shortness of breath received albuterol and Decadron as advised and changed to BiPAP. Sniff test 10/02/2019 showed no diaphragmatic paralysis. Patient also remained on intravenous Lopressor as well as Lasix with noted history of hypertension atrial fibrillation and intravenous heparin was initiated and plan is to resume Eliquis as prior to admission. Patient did not require intubation and improved with ongoing respiratory treatment. Lower extremity Dopplers completed negative for DVT. Noted leukocytosis  28,700 felt to be secondary to steroids which have been tapered to off. Surgical site follow-up per neurosurgery did not appear to show any signs of infection and follow-up imaging demonstrates prevertebral swelling largely resolved. Mild AKI with creatinine 1.45 and Lasix has been discontinued. Patient is tolerating a regular diet. Therapy evaluations completed and patient was admitted for a comprehensive rehab program.  LBM was Sunday- and was on bedpan and could control when he went, however had 2-3 incontinent BMs prior to then.  Also has condom cath per pt- however was cathed prior to then for urinary retention.  According to wife, pt is worrier- has had difficulties x 1 year with weakness- thought had a stroke, and just kept trying to compensate, and was falling at home- never hit his head.  Reports numbness "is going away", but has still some in LUE mainly; also took a deep breath a few days ago and "hurt his R rib area" so painful when takes deep breath now.  Very Hard of hearing  Review of Systems  Constitutional: Negative for chills and fever.  HENT: Negative for hearing loss.  Eyes: Negative for blurred vision and double vision.  Respiratory: Negative for cough.  Dyspnea on exertion  Cardiovascular: Positive for palpitations and leg swelling. Negative for chest pain.  Gastrointestinal: Positive for constipation. Negative for heartburn, nausea and vomiting.  GERD  Genitourinary: Negative for dysuria, flank pain and hematuria.  Musculoskeletal: Positive for joint pain, myalgias and neck pain.  Skin: Negative for rash.  Neurological: Positive for weakness.  Psychiatric/Behavioral: The patient has insomnia.  Anxiety  All other systems reviewed and are negative.       Past Medical History:  Diagnosis Date  . Anxiety   . CHF (congestive heart failure) (HCC)    h/o due to fluid overload in recovery  room  . Coronary artery disease    2v CABG, 2004 [facility])  . COVID-19   . DJD  (degenerative joint disease)   . Dyspnea    on exertion  . GERD (gastroesophageal reflux disease)   . History of kidney stones    3 stones yrs. ago  . HLD (hyperlipidemia)   . Hypertension   . MI, old 2004  . OA (osteoarthritis)   . Obesity   . Persistent atrial fibrillation ([place])    failed medical therapy with sotalol, s/p PVI x 2  . Sleep apnea    on CPAP  . Spinal stenosis   . Typical atrial flutter [facility])         Past Surgical History:  Procedure Laterality Date  . ANTERIOR CERVICAL DECOMP/DISCECTOMY FUSION [name] 09/21/2019   Procedure: ANTERIOR CERVICAL DECOMPRESSION/DISCECTOMY FUSION CERVICAL THREE- CERVICAL FOUR, CERVICAL FOUR- CERVICAL FIVE, CERVICAL FIVE- CERVICAL SIX; Surgeon: Newman Pies, MD; Location: [place]; Service: Neurosurgery; Laterality: [name]; ANTERIOR CERVICAL DECOMPRESSION/DISCECTOMY FUSION CERVICAL THREE- CERVICAL FOUR, CERVICAL FOUR- CERVICAL FIVE, CERVICAL FIVE- CERVICAL SIX  . ATRIAL FIBRILLATION ABLATION [name] 05/24/2018   Procedure: ATRIAL FIBRILLATION ABLATION; Surgeon: Thompson Grayer, MD; Location: [place] [facility]; Service: Cardiovascular; Laterality: [name];  . BACK SURGERY  2011  . CARPAL TUNNEL RELEASE     bilateral  . CHOLECYSTECTOMY    . CORONARY ARTERY BYPASS GRAFT     2004  . ELECTROPHYSIOLOGIC STUDY [name] 09/15/2016   Procedure: Atrial Fibrillation Ablation; Surgeon: Thompson Grayer, MD; Location: [place] [facility]; Service: Cardiovascular; Laterality: [name];  . fistula surgery    . hemorrhoidectomy    . LEFT HEART CATH AND CORS/GRAFTS ANGIOGRAPHY [name] 02/07/2018   Procedure: LEFT HEART CATH AND CORS/GRAFTS ANGIOGRAPHY; Surgeon: Martinique, Peter M, MD; Location: [place] [facility]; Service: Cardiovascular; Laterality: [name];  . LUMBAR LAMINECTOMY    . right foot fracture    . TEE WITHOUT CARDIOVERSION [name] 09/15/2016   Procedure: TRANSESOPHAGEAL ECHOCARDIOGRAM (TEE); Surgeon: Lelon Perla, MD; Location: [place]; Service: Cardiovascular; Laterality:  [name];  . TOTAL KNEE ARTHROPLASTY Left 07/07/2017   Procedure: LEFT TOTAL KNEE ARTHROPLASTY; Surgeon: Latanya Maudlin, MD; Location: [facility]; Service: Orthopedics; Laterality: Left;  . TRANSTHORACIC ECHOCARDIOGRAM  08/25/2010   EF 55-60%        Family History  Problem Relation Age of Onset  . Breast cancer Mother   . Hypertension Mother   . Coronary artery disease Father   . Stroke Father   . Hypertension Father    Social History: reports that he has never smoked. He has never used smokeless tobacco. He reports current alcohol use. He reports that he does not use drugs.  Allergies:       Allergies  Allergen Reactions  . Vancomycin Anaphylaxis, Shortness Of Breath and Other (See Comments)    Was informed after heart surgery  Breathing problem  patient unsure  . Aldactone [Spironolactone]     GYNECOMASTIA   . Chlorthalidone Other (See Comments)    GOUT  . Nsaids Other (See Comments)    On blood thinner         Medications Prior to Admission  Medication Sig Dispense Refill  . acetaminophen (TYLENOL) 500 MG tablet Take 1,000 mg by mouth every 6 (six) hours as needed for moderate pain.    Marland Kitchen apixaban (ELIQUIS) 5 MG TABS tablet Take 1 tablet (5 mg total) by mouth 2 (two) times daily. 42 tablet 0  . atorvastatin (LIPITOR) 20 MG tablet TAKE 1 TABLET BY MOUTH  EVERY DAY 90 tablet 1  . Coenzyme Q10 (CO Q 10 PO) Take 400 mg by mouth daily.     Marland Kitchen. diltiazem (CARDIZEM CD) 180 MG 24 hr capsule Take 1 capsule (180 mg total) by mouth daily. 90 capsule 3  . eplerenone (INSPRA) 25 MG tablet TAKE 1 TABLET BY MOUTH TWICE A DAY (Patient taking differently: Take 25 mg by mouth 2 (two) times daily. ) 180 tablet 2  . furosemide (LASIX) 20 MG tablet TAKE 1 TABLET BY MOUTH DAILY AS NEEDED FOR SWELLING/WEIGHT GAIN 90 tablet 1  . hydrALAZINE (APRESOLINE) 50 MG tablet Take 1.5 tablets (75 mg total) by mouth 3 (three) times daily. 405 tablet 1  . irbesartan (AVAPRO) 300 MG tablet Take 1 tablet (300 mg total) by  mouth daily. (Patient taking differently: Take 300 mg by mouth at bedtime. ) 90 tablet 3  . LORazepam (ATIVAN) 0.5 MG tablet Take 1 tablet (0.5 mg total) by mouth 3 (three) times daily. 12 tablet 0  . metoprolol tartrate (LOPRESSOR) 25 MG tablet TAKE 1 TABLET (25 MG TOTAL) BY MOUTH EVERY 8 (EIGHT) HOURS AS NEEDED (A-FIB). (Patient taking differently: Take 25 mg by mouth daily. ) 270 tablet 1  . Multiple Vitamin (MULTIVITAMIN) tablet Take 1 tablet by mouth daily.    Marland Kitchen. omeprazole (PRILOSEC) 20 MG capsule TAKE 1 CAPSULE BY MOUTH EVERY OTHER DAY. (Patient taking differently: Take 20 mg by mouth every other day. ) 45 capsule 3  . PRESCRIPTION MEDICATION Pt uses CPAP machine at bedtime    . sertraline (ZOLOFT) 50 MG tablet Take 50 mg by mouth daily.    . nitroGLYCERIN (NITROSTAT) 0.4 MG SL tablet Place 1 tablet (0.4 mg total) under the tongue every 5 (five) minutes x 3 doses as needed for chest pain. (Patient not taking: Reported on 09/13/2019) 25 tablet 1   Drug Regimen Review  Drug regimen was reviewed and remains appropriate with no significant issues identified  Home:  Home Living  Family/patient expects to be discharged to:: Private residence  Living Arrangements: Spouse/significant other  Available Help at Discharge: Family  Type of Home: House  Home Access: Stairs to enter  Secretary/administratorntrance Stairs-Number of Steps: 1  Home Layout: One level  Bathroom Shower/Tub: Pension scheme managerWalk-in shower  Bathroom Toilet: Handicapped height  Home Equipment: Environmental consultantWalker - 2 wheels, Cane - single point  Functional History:  Prior Function  Level of Independence: Needs assistance  Gait / Transfers Assistance Needed: Increased time  ADL's / Homemaking Assistance Needed: Pt requiring assist as LUE limited due to previous injury and RUE limited by new weakness and decreased coordination.  Functional Status:  Mobility:  Bed Mobility  Overal bed mobility: Needs Assistance  Bed Mobility: Supine to Sit, Sit to Sidelying, Sit to Supine    Rolling: Min assist  Sidelying to sit: Min assist  Sit to supine: Mod assist, +2 for safety/equipment, +2 for physical assistance, HOB elevated  Sit to sidelying: Max assist, +2 for physical assistance  General bed mobility comments: MinA with use of rail for and trunk elevation; sitting EOB to supine, assist with BLEs.  Transfers  Overall transfer level: Needs assistance  Equipment used: Rolling walker (2 wheeled)  Transfers: Sit to/from Stand, Stand Pivot Transfers  Sit to Stand: Min assist, Mod assist, +2 physical assistance, +2 safety/equipment  Stand pivot transfers: Min assist, Mod assist, +2 physical assistance, +2 safety/equipment, From elevated surface  General transfer comment: Pt able to stand with RW for pivot and for ambulation with assist  for steadying  Ambulation/Gait  Ambulation/Gait assistance: Min assist, +2 safety/equipment  Gait Distance (Feet): 20 Feet(10+10 to and from bathroom)  Assistive device: Rolling walker (2 wheeled)  Gait Pattern/deviations: Step-through pattern, Decreased stride length, Drifts right/left, Trunk flexed  General Gait Details: Min assist for steadying, pt with 2 LOB during initial steps after standing requiring mod steadying assist at gait belt to correct. Verbal cuing for placement in RW, upright posture.  Gait velocity: decr  Gait velocity interpretation: <1.8 ft/sec, indicate of risk for recurrent falls   ADL:  ADL  Overall ADL's : Needs assistance/impaired  Eating/Feeding: Set up, Sitting  Grooming: Minimal assistance, Sitting, Standing  Grooming Details (indicate cue type and reason): Assist for weakness in BUEs  Upper Body Bathing: Moderate assistance, Sitting  Lower Body Bathing: Maximal assistance, Cueing for safety  Upper Body Dressing : Moderate assistance, Sitting  Upper Body Dressing Details (indicate cue type and reason): Assist as arms are limited by poor coordination and weakness  Lower Body Dressing: Maximal assistance,  Sitting/lateral leans, Sit to/from stand  Toilet Transfer: Minimal assistance, +2 for physical assistance, +2 for safety/equipment, Ambulation, BSC, RW  Toileting- Clothing Manipulation and Hygiene: Moderate assistance, Sitting/lateral lean, Sit to/from stand, Adhering to back precautions  Functional mobility during ADLs: Minimal assistance, +2 for physical assistance, +2 for safety/equipment, Rolling walker, Cueing for safety  General ADL Comments: Pt education on back precautions and reducing lean at sink.  Cognition:  Cognition  Overall Cognitive Status: Within Functional Limits for tasks assessed  Orientation Level: Oriented X4  Cognition  Arousal/Alertness: Awake/alert  Behavior During Therapy: WFL for tasks assessed/performed  Overall Cognitive Status: Within Functional Limits for tasks assessed  General Comments: Pt asking for Ativan s/p PT/OT session. RN aware.  Physical Exam:  Blood pressure 120/67, pulse 64, temperature 98.7 F (37.1 C), temperature source Axillary, resp. rate (!) 24, height 5\' 6"  (1.676 m), weight 109.3 kg, SpO2 96 %.  Physical Exam  Nursing note and vitals reviewed.  Constitutional: He is oriented to person, place, and time.  Obese, barrel chested male on bed; supine; wife in room, On O2 high flow by - 5L, NAD  HENT:  Head: Atraumatic.  Mouth/Throat: Oropharynx is clear and moist. No oropharyngeal exudate.  Eyes: Pupils are equal, round, and reactive to light. EOM are normal. No scleral icterus.  Neck:  Incision on anterior neck- hard to see due to body habitus, but appears to be glued- a lot of bruising and ecchymosis  Cardiovascular: Regular rhythm.  RRR  Respiratory: Effort normal and breath sounds normal. No respiratory distress. He has no wheezes.  Quiet breath sounds- CTA B/L  GI:  Protuberant, ND per pt, NT, (+) hypoactive BS  Genitourinary: Genitourinary Comments: Condom cath  Musculoskeletal:  Comments: RUE bicep 4+/5, WE 4+/5, triceps 4+/5,  grip 4+/5, and finger abd 4+/5 LUE- biceps 3+/5, WE 3+/5, triceps 3/5, grip 3/5, finger abd 3-/5 RLE- HF 5-/5, KE 4+/5, DF 5-/5, PF 5-/5, EHL 4+/5 LLE- HF 4-/5, KE 4/5, DF 4-/5, PF 4/5, EHL 4-/5  Neurological: He is alert and oriented to person, place, and time. No cranial nerve deficit.  Patient is alert in no acute distress interactive with conversation. Follows full commands. Oriented x3. Hoffman's B/L in UEs- no clonus B/L Decreased sensation to light touch L C5-T1 otherwise intact to light touch in all 3 other extremities and torso B/L Skin:  Surgical site neck hematoma resolving. Noted ecchymosis at site. 2 IVs in RUE- no signs of infiltration/etc  Psychiatric:  anxious   Lab Results Last 48 Hours        Results for orders placed or performed during the hospital encounter of 09/21/19 (from the past 48 hour(s))  CBC Status: Abnormal   Collection Time: 10/02/19 6:47 AM  Result Value Ref Range   WBC 28.9 (H) 4.0 - 10.5 K/uL   RBC 5.58 4.22 - 5.81 MIL/uL   Hemoglobin 16.5 13.0 - 17.0 g/dL   HCT 56.9 79.4 - 80.1 %   MCV 83.7 80.0 - 100.0 fL   MCH 29.6 26.0 - 34.0 pg   MCHC 35.3 30.0 - 36.0 g/dL   RDW 65.5 37.4 - 82.7 %   Platelets 305 150 - 400 K/uL   nRBC 0.0 0.0 - 0.2 %    Comment: Performed at [facility], [address], [place], [place] [address]  Heparin level (unfractionated) Status: None   Collection Time: 10/02/19 6:47 AM  Result Value Ref Range   Heparin Unfractionated 0.55 0.30 - 0.70 IU/mL    Comment: (NOTE)  If heparin results are below expected values, and patient dosage has  been confirmed, suggest follow up testing of antithrombin III levels.  Performed at [facility], [address] [place], [place]  [address]   BMET AM Status: Abnormal   Collection Time: 10/02/19 6:47 AM  Result Value Ref Range   Sodium 135 135 - 145 mmol/L   Potassium 4.1 3.5 - 5.1 mmol/L   Chloride 92 (L) 98 - 111 mmol/L   CO2 30 22 - 32 mmol/L   Glucose, Bld 132 (H)  70 - 99 mg/dL   BUN 36 (H) 8 - 23 mg/dL   Creatinine, Ser 0.10 (H) 0.61 - 1.24 mg/dL   Calcium 9.5 8.9 - 07.1 mg/dL   GFR calc non Af Amer 50 (L) >60 mL/min   GFR calc Af Amer 58 (L) >60 mL/min   Anion gap 13 5 - 15    Comment: Performed at [facility], [address], [place], [place] [address]  MAG AM Status: None   Collection Time: 10/02/19 6:47 AM  Result Value Ref Range   Magnesium 2.3 1.7 - 2.4 mg/dL    Comment: Performed at [facility], [address] [place], [place] [address]  Phosphorus AM Status: Abnormal   Collection Time: 10/02/19 6:47 AM  Result Value Ref Range   Phosphorus 4.8 (H) 2.5 - 4.6 mg/dL    Comment: Performed at [facility], [address], [place], [place] [address]  Procalcitonin - Baseline Status: None   Collection Time: 10/02/19 6:47 AM  Result Value Ref Range   Procalcitonin <0.10 ng/mL    Comment: Interpretation:  PCT (Procalcitonin) <= 0.5 ng/mL:  Systemic infection (sepsis) is not likely.  Local bacterial infection is possible.  (NOTE)  Sepsis PCT Algorithm Lower Respiratory Tract  Infection PCT Algorithm  ---------------------------- ----------------------------  PCT < 0.25 ng/mL PCT < 0.10 ng/mL  Strongly encourage Strongly discourage  discontinuation of antibiotics initiation of antibiotics  ---------------------------- -----------------------------  PCT 0.25 - 0.50 ng/mL PCT 0.10 - 0.25 ng/mL  OR  >80% decrease in PCT Discourage initiation of  antibiotics  Encourage discontinuation  of antibiotics  ---------------------------- -----------------------------  PCT >= 0.50 ng/mL PCT 0.26 - 0.50 ng/mL  AND  <80% decrease in PCT Encourage initiation of  antibiotics  Encourage continuation  of antibiotics  ---------------------------- -----------------------------  PCT >= 0.50 ng/mL PCT > 0.50 ng/mL  AND  increase in PCT Strongly encourage  initiation of antibiotics  Strongly encourage escalation  of antibiotics    -----------------------------  PCT <= 0.25 ng/mL  OR  > 80% decrease in PCT  Discontinue / Do not initiate  antibiotics  Performed at [facility], [address], [place], [place]  [address]    Imaging Results (Last 48 hours)    Medical Problem List and Plan:  1. 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 Soft Cervical collar when OOB  -patient may shower if cover cervical incision  -ELOS/Goals: 16-20 days/Goals Supervision- CGA  2. Antithrombotics:  -DVT/anticoagulation: Resume Eliquis 5 mg twice daily as prior to admission; off heparin gtt  -antiplatelet therapy: [name]  3. Pain Management: Flexeril and oxycodone as needed  4. Mood: Zoloft 50 mg daily, Klonopin 0.25 mg twice daily- will check if was changed from dose at home;  -antipsychotic agents: [name]  5. Neuropsych: This patient is capable of making decisions on his own behalf.  6. Skin/Wound Care: Routine skin checks  7. Fluids/Electrolytes/Nutrition: Routine in and outs with follow-up chemistries  8. History of atrial fibrillation. Patient transitioned from intravenous heparin back to chronic Eliquis. Cardiac rate controlled. Continue Cardizem 100 mg daily, Lopressor 12.5 mg twice daily  9. Hypertension. Aldactone 25 mg daily, hydralazine 75 mg 3 times daily. Monitor with increased mobility  10. History of CAD with CABG 2004. No complaints of increasing chest pain.  11. Leukocytosis. Felt to be steroid induced. Prednisone taper completed. Follow-up CBC  12. Diastolic congestive heart failure. Monitor for any signs of fluid overload  13. OSA. Currently on BiPAP.  14. Hyperlipidemia. Lipitor  15. BPH and neurogenic bladder. Flomax 0.8 mg daily. Check PVRs for 3 days and cath if necessary  16. Constipation/Neurogenic bowel. Laxative  assistance; will clean out and see if has control of stool and if not, will put on bowel program.  17. GERD. Protonix  18. Anxiety- Will make sure on home doses of Benzo.  Mcarthur Rossetti Angiulli, PA-C  10/03/2019  I have personally performed a face to face diagnostic evaluation of this patient and formulated the key components of the plan. Additionally, I have personally reviewed laboratory data, imaging studies, as well as relevant notes and concur with the physician assistant's documentation above.  The patient's status has not changed from the original H&P. Any changes in documentation from the acute care chart have been noted above.

## 2019-10-04 NOTE — Progress Notes (Signed)
Moved up to [place] HF at beginning of shift, CPAP overnight tolerating well. Breathing improved at rest

## 2019-10-04 NOTE — Progress Notes (Signed)
Occupational Therapy Session Note  Patient Details  Name: [name] [name] MRN: [id number] Date of Birth: 1950/12/18  Today's Date: 10/04/2019 OT Individual Time: 1100-1200 OT Individual Time Calculation (min): 60 min    Short Term Goals: Week 1:  OT Short Term Goal 1 (Week 1): Pt will complete toilet transfer wiht CGA OT Short Term Goal 2 (Week 1): Pt will thread head into shirt wiht AE PRN OT Short Term Goal 3 (Week 1): Pt will thread BLE into pants wiht AE PRN OT Short Term Goal 4 (Week 1): Pt will groom in stanidng with CGA for improved endurnace  Skilled Therapeutic Interventions/Progress Updates:    Pt seen for OT session focusing on functional mobility, ADL re-training, and activity tolerance. Pt asleep in supine upon arrival with wife present. Pt easily awoken and denying pain, agreeable to tx session.   Transfers: Sit>stand with close supervision, VCs for hand placement on RW and reaching back when sitting, however, with limited carryover/follow-through. Ambulated throughout room with RW and CGA with assist for management of O2 tank  ADL Re-training: In standing, pt stood at [facility] while condom cath removed, standing with close supervision overall. Tolerated ~3 minutes in standing before requiring seated rest break.  Ambulated into bathroom and completed standing toileting task with min A for balance. Returned to sink to wash hands with steadying assist Stood to complete oral care with min A for balance. Required increased time for manipulation of set-up of self-care items using B UEs.   Pt initially on 7L supplemental O2 upon arrival. Completed grooming tasks in standing on 6L and O2 states remaining >96% with activity. Placed on 4L supplemental O2 during ambulation and toileting task. O2 dropping to 92%, however, recovered within seconds to >96%. Pt left seated in w/c at end of session on 4L supplemental O2. RN made aware and present at end of session.  Education provided to pt and  wife regarding role of OT, POC, OT/PT goals, activity progression, continuum of care, and d/c planning.   Therapy Documentation Precautions:  Restrictions Weight Bearing Restrictions: No   Therapy/Group: Individual Therapy  Brayleigh Rybacki L 10/04/2019, 7:11 AM

## 2019-10-05 ENCOUNTER — Inpatient Hospital Stay (HOSPITAL_COMMUNITY): Payer: Medicare Other | Admitting: Physical Therapy

## 2019-10-05 ENCOUNTER — Inpatient Hospital Stay (HOSPITAL_COMMUNITY): Payer: Medicare Other | Admitting: Occupational Therapy

## 2019-10-05 ENCOUNTER — Inpatient Hospital Stay (HOSPITAL_COMMUNITY): Payer: Medicare Other

## 2019-10-05 LAB — PATHOLOGIST SMEAR REVIEW

## 2019-10-05 MED ORDER — MAGNESIUM CITRATE PO SOLN
1.0000 | Freq: Once | ORAL | Status: AC
  Administered 2019-10-05: 1 via ORAL
  Filled 2019-10-05: qty 296

## 2019-10-05 MED ORDER — LORAZEPAM 0.5 MG PO TABS
0.5000 mg | ORAL_TABLET | Freq: Four times a day (QID) | ORAL | Status: DC
  Administered 2019-10-05 – 2019-10-17 (×47): 0.5 mg via ORAL
  Filled 2019-10-05 (×48): qty 1

## 2019-10-05 NOTE — Progress Notes (Signed)
RT placed pt on BiPAP dream station for the night. Pts current settings 12/6 w/6 Lpm bled into the system. Pt respiratory status is stable at this time. RT will continue to monitor.

## 2019-10-05 NOTE — Progress Notes (Signed)
Occupational Therapy Session Note  Patient Details  Name: [name] [name] MRN: [id number] Date of Birth: 1951/08/28  Today's Date: 10/05/2019 OT Individual Time: 1100-1110 OT Individual Time Calculation (min): 10 min  and Today's Date: 10/05/2019 OT Missed Time: 50 Minutes Missed Time Reason: Patient fatigue   Short Term Goals: Week 1:  OT Short Term Goal 1 (Week 1): Pt will complete toilet transfer wiht CGA OT Short Term Goal 2 (Week 1): Pt will thread head into shirt wiht AE PRN OT Short Term Goal 3 (Week 1): Pt will thread BLE into pants wiht AE PRN OT Short Term Goal 4 (Week 1): Pt will groom in stanidng with CGA for improved endurnace  Skilled Therapeutic Interventions/Progress Updates:    Pt in supine upon arrival with NTs present performing cathing. Pt's wife educated regarding purpose of in/out cathing, bowel program, return of function in regards to bowel/bladder function as well as how pt is coping with current situation. Pt's wife voiced understanding and appreciation for all education. Once cathing complete, pt refusing any OOB activity at this time with multiple complaints of nausea, pain, SOB, etc. Repositioned for comfort and RN made aware.  Therapist return 45 minutes later to assist pt to chair for meal. Pt on C-Pap and declining mobility at this time.  Will attempt to make up time as pt able.   Therapy Documentation Precautions:  Precautions Precautions: Cervical Precaution Booklet Issued: Yes (comment) Precaution Comments: verbal discussion of precautions Required Braces or Orthoses: Cervical Brace Cervical Brace: Hard collar Restrictions Weight Bearing Restrictions: No    Therapy/Group: Individual Therapy  Neeti Knudtson L 10/05/2019, 6:58 AM

## 2019-10-05 NOTE — Progress Notes (Signed)
Placed patient on BIPAP for the night with IPAP set at 10cm and EPAP set at 5cm. Oxygen set at 5lpm with Sp02=98%

## 2019-10-05 NOTE — Progress Notes (Signed)
Physical Therapy Session Note  Patient Details  Name: [name] [name] MRN: [id number] Date of Birth: 11/29/1950  Today's Date: 10/05/2019 PT Individual Time: 1400-1500 PT Individual Time Calculation (min): 60 min   Short Term Goals: Week 1:  PT Short Term Goal 1 (Week 1): Pt will ambulate x 100 ft with LRAD and min A PT Short Term Goal 2 (Week 1): Pt will navigate 4 stairs with 1 handrail and min A PT Short Term Goal 3 (Week 1): Pt will perform bed mobility with Supervision  Skilled Therapeutic Interventions/Progress Updates:    Pt received semi-reclined in bed asleep with Cpap machine, arousable and agreeable to PT session. No complaints of pain but pt does report feeling significantly fatigued this PM. Semi-reclined to sitting EOB with HOB maximally elevated at Supervision level. Dependent to don Aspen collar while seated EOB. Sit to stand with min A to RW and SPT to w/c with RW and min A. Ambulation 4 x 50 ft with RW and min A, v/c for safe RW management as pt tends to push RW out in front of him during gait and with sit to stand transfers. Pt initially on 4L O2 at rest and with activity, SpO2 94% and above. Decreased supplemental O2 by 1L during each bout of ambulation down to 1L O2. SpO2 remains at 94% and above with activity even on 1L O2. Nustep level 3 x 5 min with use of B UE/LE for global endurance training while on 1L O2, SpO2 remains at 95% and above. Pt requests to remain seated in w/c at end of session. Pt left seated in w/c in room with needs in reach, wife present at end of session.  Therapy Documentation Precautions:  Precautions Precautions: Cervical Precaution Booklet Issued: Yes (comment) Precaution Comments: verbal discussion of precautions Required Braces or Orthoses: Cervical Brace Cervical Brace: Hard collar Restrictions Weight Bearing Restrictions: No    Therapy/Group: Individual Therapy   Excell Seltzer, PT, DPT  10/05/2019, 3:33 PM

## 2019-10-05 NOTE — Progress Notes (Signed)
[facility] PHYSICAL MEDICINE & REHABILITATION PROGRESS NOTE   Subjective/Complaints:  When entered room, pt c/o feeling very SOB- appeared to be in paradoxical breathing(?), turned up high flow O2 to 8-10L, but pt still c/o SOB- put him on CPAP and his breathing pattern improved immediately.   Pt c/o nausea, abd discomfort, feeling full/constipated; only got out 1 small piece last night of stool- also though room was hot- was 68 and felt cool; also noted very anxious and has nasal discharge, c/w AD likely from constipation.  Stayed in room until nurse came and helped- got pt's BP, by the time got on CPAP,, and then checked BP was 114/71, but likely had come down. Pt also c/o severe anxiety- was on Ativan 0.5 mg 4x/day SCHEDULED at home- will put back on, so can participate well in therapy.    ROS- pt denied  CP, HA, did report some constipation, but denied (+) N/V/D; also (+) abd pain/fullness . (+)SOB Objective:   DG Abd 1 View  Result Date: 10/05/2019 CLINICAL DATA:  Constipation EXAM: ABDOMEN - 1 VIEW COMPARISON:  None. FINDINGS: Scattered large and small bowel gas is noted. A few mildly prominent loops of small bowel are noted in the left mid abdomen although no true obstructive changes are seen. No abnormal mass or abnormal calcifications are noted. Degenerative changes of lumbar spine are seen. IMPRESSION: Mildly prominent loops of mid small bowel without definitive obstruction. Electronically Signed   By: Alcide Clever M.D.   On: 10/05/2019 10:18   Recent Labs    10/03/19 0537 10/04/19 0523  WBC 28.7* 21.7*  HGB 15.1 14.6  HCT 43.8 42.3  PLT 329 269   Recent Labs    10/03/19 0537 10/04/19 0523  NA 135 135  K 3.8 3.7  CL 97* 96*  CO2 26 24  GLUCOSE 141* 129*  BUN 40* 32*  CREATININE 1.31* 1.03  CALCIUM 9.2 9.2    Intake/Output Summary (Last 24 hours) at 10/05/2019 1048 Last data filed at 10/05/2019 0840 Gross per 24 hour  Intake 670 ml  Output -  Net 670 ml      Physical Exam: Vital Signs Blood pressure (!) 125/96, pulse 62, temperature 98 F (36.7 C), temperature source Oral, resp. rate 20, height 5\' 6"  (1.676 m), SpO2 98 %.  Constitutional:  Obese, barrel chested male on bed; supine; appears SOB,, put on CPAP and paradoxical breathing improved and Rrate improved as well  HENT:  Head: Atraumatic.  Mouth/Throat: Oropharynx is clear but a little dry due to O2 Eyes:  EOM are normal.  Neck:  Incision on anterior neck-, but appears to be glued- a lot of bruising and ecchymosis  Cardiovascular:bradycardic Regular rhytm Respiratory: very coarse, throughout; paradoxical breathing initially; improved;  GI:  Protuberant, distended; slightly diffusely TTP, (+) very hypoactive BS  Genitourinary: Genitourinary Comments: Condom cath  Musculoskeletal:  Comments: RUE bicep 4+/5, WE 4+/5, triceps 4+/5, grip 4+/5, and finger abd 4+/5 LUE- biceps 3+/5, WE 3+/5, triceps 3/5, grip 3/5, finger abd 3-/5 RLE- HF 5-/5, KE 4+/5, DF 5-/5, PF 5-/5, EHL 4+/5 LLE- HF 4-/5, KE 4/5, DF 4-/5, PF 4/5, EHL 4-/5  Neurological: . Oriented x3. Hoffman's B/L in UEs- no clonus B/L Decreased sensation to light touch L C5-T1 otherwise intact to light touch in all 3 other extremities and torso B/L Skin:  Surgical site neck hematoma resolving. Noted ecchymosis at site. 2 IVs in RUE- no signs of infiltration/etc  Psychiatric:  Anxious - more so  Assessment/Plan: 1. 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  Physiatrist is providing close team supervision and 24 hour management of active medical problems listed below.  Physiatrist and rehab team continue to assess barriers to discharge/monitor patient progress toward functional and medical goals  Care Tool:  Bathing    Body parts bathed by patient: Left arm, Chest, Abdomen, Front perineal area, Right  upper leg, Left upper leg, Face   Body parts bathed by helper: Right arm, Buttocks, Right lower leg, Left lower leg     Bathing assist Assist Level: Moderate Assistance - Patient 50 - 74%     Upper Body Dressing/Undressing Upper body dressing   What is the patient wearing?: Pull over shirt    Upper body assist Assist Level: Maximal Assistance - Patient 25 - 49%    Lower Body Dressing/Undressing Lower body dressing      What is the patient wearing?: Pants     Lower body assist Assist for lower body dressing: Moderate Assistance - Patient 50 - 74%     Toileting Toileting    Toileting assist Assist for toileting: Minimal Assistance - Patient > 75%     Transfers Chair/bed transfer  Transfers assist     Chair/bed transfer assist level: Minimal Assistance - Patient > 75%     Locomotion Ambulation   Ambulation assist      Assist level: Minimal Assistance - Patient > 75% Assistive device: Walker-rolling Max distance: 50'   Walk 10 feet activity   Assist     Assist level: Minimal Assistance - Patient > 75% Assistive device: Walker-rolling   Walk 50 feet activity   Assist    Assist level: Minimal Assistance - Patient > 75% Assistive device: Walker-rolling    Walk 150 feet activity   Assist Walk 150 feet activity did not occur: Safety/medical concerns         Walk 10 feet on uneven surface  activity   Assist Walk 10 feet on uneven surfaces activity did not occur: Safety/medical concerns         Wheelchair     Assist Will patient use wheelchair at discharge?: No             Wheelchair 50 feet with 2 turns activity    Assist            Wheelchair 150 feet activity     Assist          Blood pressure (!) 125/96, pulse 62, temperature 98 F (36.7 C), temperature source Oral, resp. rate 20, height 5\' 6"  (1.676 m), SpO2 98 %. Problem List and Plan:  1. 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 Soft Cervical collar when OOB  -patient may shower if cover cervical incision  -ELOS/Goals: 16-20 days/Goals Supervision- CGA  2. Antithrombotics:  -DVT/anticoagulation: Resume Eliquis 5 mg twice daily as prior to admission; off heparin gtt  -antiplatelet therapy: [name]  3. Pain Management: Flexeril and oxycodone as needed  4. Mood: Zoloft 50 mg daily, Klonopin 0.25 mg twice daily- will check if was changed from dose at home;   12/31- changed to Ativan QID 0.5 mg- home dose -antipsychotic agents: [name]  5. Neuropsych: This patient is capable of making decisions on his own behalf.  6. Skin/Wound Care: Routine skin checks  7. Fluids/Electrolytes/Nutrition: Routine in and outs with follow-up chemistries  8. History of atrial fibrillation. Patient transitioned from intravenous heparin back to chronic Eliquis. Cardiac rate controlled. Continue Cardizem 100 mg daily, Lopressor 12.5 mg twice daily  9. Hypertension. Aldactone 25 mg daily, hydralazine 75 mg 3 times daily. Monitor with increased mobility  10. History of CAD with CABG 2004. No complaints of increasing chest pain.  11. Leukocytosis. Felt to be steroid induced. Prednisone taper completed. Follow-up CBC   12/30- down to 21.7 form 28k 12. Diastolic congestive heart failure. Monitor for any signs of fluid overload   12/30- no weights yet 13. OSA. Currently on BiPAP.   12/30- back on CPAP last night- will ask reps therapy to address loose fitting CPAP. 14. Hyperlipidemia. Lipitor  15. BPH and neurogenic bladder. Flomax 0.8 mg daily. Check PVRs for 3 days and cath if necessary  16. Constipation/Neurogenic bowel. Laxative assistance; will clean out and see if has control of stool and if not, will put on bowel program.   12/31- will order Mg citrate and then 3-4 hours later, an  enema to clear him out. KUB wasn't real helpful, but had more stool when assessed it myself than discussed. 17. GERD. Protonix  18. Anxiety- Will make sure on home doses of Benzo.  12/31- changed to home dose Ativan QID 0.5 mg 19. Respiratory insufficiency  12/30- on 5-6L high flow O2- will wean as tolerated  - will order continuous pulse ox for now. 20. Azotemia  12/30- BUN is 32 down form 40- will monitor; Cr 1.03 down from 1.32 21. Likely AD episode today  12/31- paradoxical breathing, SOB, nasal congestion, anxiety- didn't c/o HA- likely due to constipation   LOS: 2 days A FACE TO FACE EVALUATION WAS PERFORMED  Leticia Coletta 10/05/2019, 10:48 AM

## 2019-10-06 ENCOUNTER — Inpatient Hospital Stay (HOSPITAL_COMMUNITY): Payer: Medicare Other | Admitting: Physical Therapy

## 2019-10-06 ENCOUNTER — Inpatient Hospital Stay (HOSPITAL_COMMUNITY): Payer: Medicare Other | Admitting: Occupational Therapy

## 2019-10-06 NOTE — Progress Notes (Signed)
Per day-shift report, pt to receive tap water enema if unable to have BM. However, enema was not needed. Pt had medium type 7 stool.   Pt transferred to bed, stating that he was tired and ready to sleep. Will continue to monitor.

## 2019-10-06 NOTE — Progress Notes (Signed)
[facility] PHYSICAL MEDICINE & REHABILITATION PROGRESS NOTE   Subjective/Complaints:  Pt reports feeling MUCH better this AM- is OFF O2 by [place] completely this AM staying 94-95% on RA throughout walking, etc.  Had 2 large BMs last evening/yesterday and stomach feeling MUCH better. No more nausea.  Asking if can fix BP- labile- has been his whole life.   ROS- pt denied  CP, HA,   N/V/D; abd pain/fullness . Denies SOB Objective:   DG Abd 1 View  Result Date: 10/05/2019 CLINICAL DATA:  Constipation EXAM: ABDOMEN - 1 VIEW COMPARISON:  None. FINDINGS: Scattered large and small bowel gas is noted. A few mildly prominent loops of small bowel are noted in the left mid abdomen although no true obstructive changes are seen. No abnormal mass or abnormal calcifications are noted. Degenerative changes of lumbar spine are seen. IMPRESSION: Mildly prominent loops of mid small bowel without definitive obstruction. Electronically Signed   By: Alcide Clever M.D.   On: 10/05/2019 10:18   Recent Labs    10/04/19 0523  WBC 21.7*  HGB 14.6  HCT 42.3  PLT 269   Recent Labs    10/04/19 0523  NA 135  K 3.7  CL 96*  CO2 24  GLUCOSE 129*  BUN 32*  CREATININE 1.03  CALCIUM 9.2    Intake/Output Summary (Last 24 hours) at 10/06/2019 1000 Last data filed at 10/05/2019 2100 Gross per 24 hour  Intake 320 ml  Output 450 ml  Net -130 ml     Physical Exam: Vital Signs Blood pressure (!) 123/51, pulse 82, temperature (!) 97.5 F (36.4 C), resp. rate 20, height 5\' 6"  (1.676 m), SpO2 96 %.  Constitutional:  Obese, barrel chested male on mat;sitting up in gym with PT, OFF O2, brighter affect, NAD HENT:  Head: Atraumatic.  Mouth/Throat: Oropharynx is clear  Eyes:  EOM are normal.Sclera are erythematous -mild-moderate B/L Neck:  Incision on anterior neck-, but appears to be glued- a lot of bruising and ecchymosis  Cardiovascular: sounded like in Regular rhythm; rate controlled Respiratory: CTA  B/L GI: soft, NT, ND, (+)BS  Musculoskeletal:  Comments: RUE bicep 4+/5, WE 4+/5, triceps 4+/5, grip 4+/5, and finger abd 4+/5 LUE- biceps 3+/5, WE 3+/5, triceps 3/5, grip 3/5, finger abd 3-/5 RLE- HF 5-/5, KE 4+/5, DF 5-/5, PF 5-/5, EHL 4+/5 LLE- HF 4-/5, KE 4/5, DF 4-/5, PF 4/5, EHL 4-/5  Neurological: . Oriented x3. Hoffman's B/L in UEs- no clonus B/L Decreased sensation to light touch L C5-T1 otherwise intact to light touch in all 3 other extremities and torso B/L Skin:  Surgical site neck hematoma resolving. Noted ecchymosis at site. Psychiatric:  Brighter affect; much less anxiety   Assessment/Plan: 1. 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  Physiatrist is providing close team supervision and 24 hour management of active medical problems listed below.  Physiatrist and rehab team continue to assess barriers to discharge/monitor patient progress toward functional and medical goals  Care Tool:  Bathing    Body parts bathed by patient: Left arm, Chest, Abdomen, Front perineal area, Right upper leg, Left upper leg, Face   Body parts bathed by helper: Buttocks     Bathing assist Assist Level: Moderate Assistance - Patient 50 - 74%     Upper Body Dressing/Undressing Upper body dressing   What is the patient wearing?: Pull over shirt    Upper body assist  Assist Level: Maximal Assistance - Patient 25 - 49%    Lower Body Dressing/Undressing Lower body dressing      What is the patient wearing?: Pants     Lower body assist Assist for lower body dressing: Minimal Assistance - Patient > 75%     Toileting Toileting    Toileting assist Assist for toileting: Minimal Assistance - Patient > 75%     Transfers Chair/bed transfer  Transfers assist     Chair/bed transfer assist level: Independent with assistive device Chair/bed transfer assistive  device: Walker, Archivist   Ambulation assist      Assist level: Minimal Assistance - Patient > 75% Assistive device: Walker-rolling Max distance: 50'   Walk 10 feet activity   Assist     Assist level: Minimal Assistance - Patient > 75% Assistive device: Walker-rolling   Walk 50 feet activity   Assist    Assist level: Minimal Assistance - Patient > 75% Assistive device: Walker-rolling    Walk 150 feet activity   Assist Walk 150 feet activity did not occur: Safety/medical concerns         Walk 10 feet on uneven surface  activity   Assist Walk 10 feet on uneven surfaces activity did not occur: Safety/medical concerns         Wheelchair     Assist Will patient use wheelchair at discharge?: No             Wheelchair 50 feet with 2 turns activity    Assist            Wheelchair 150 feet activity     Assist          Blood pressure (!) 123/51, pulse 82, temperature (!) 97.5 F (36.4 C), resp. rate 20, height 5\' 6"  (1.676 m), SpO2 96 %. Problem List and Plan:  1. 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 Soft Cervical collar when OOB  -patient may shower if cover cervical incision  -ELOS/Goals: 16-20 days/Goals Supervision- CGA  2. Antithrombotics:  -DVT/anticoagulation: Resume Eliquis 5 mg twice daily as prior to admission; off heparin gtt  -antiplatelet therapy: [name]  3. Pain Management: Flexeril and oxycodone as needed  4. Mood: Zoloft 50 mg daily, Klonopin 0.25 mg twice daily- will check if was changed from dose at home;   12/31- changed to Ativan QID 0.5 mg- home dose -antipsychotic agents: [name]  5. Neuropsych: This patient is capable of making decisions on his own behalf.  6. Skin/Wound Care: Routine skin checks   7. Fluids/Electrolytes/Nutrition: Routine in and outs with follow-up chemistries  8. History of atrial fibrillation. Patient transitioned from intravenous heparin back to chronic Eliquis. Cardiac rate controlled. Continue Cardizem 100 mg daily, Lopressor 12.5 mg twice daily  9. Hypertension. Aldactone 25 mg daily, hydralazine 75 mg 3 times daily. Monitor with increased mobility  10. History of CAD with CABG 2004. No complaints of increasing chest pain.  11. Leukocytosis. Felt to be steroid induced. Prednisone taper completed. Follow-up CBC   12/30- down to 21.7 form 28k 12. Diastolic congestive heart failure. Monitor for any signs of fluid overload   12/30- no weights yet  1/1- didn't have daily weights- changed order  13. OSA. Currently on BiPAP.   12/30- back on CPAP last night- will ask reps therapy to address loose fitting CPAP.  1/1- doing BiPAP setting  here- will speak with reps therapy plan. 14. Hyperlipidemia. Lipitor  15. BPH and neurogenic bladder. Flomax 0.8 mg daily. Check PVRs for 3 days and cath if necessary  16. Constipation/Neurogenic bowel. Laxative assistance; will clean out and see if has control of stool and if not, will put on bowel program.   12/31- will order Mg citrate and then 3-4 hours later, an enema to clear him out. KUB wasn't real helpful, but had more stool when assessed it myself than discussed.  1/1- cleaned out- 2 very large BMs- feeling better 17. GERD. Protonix  18. Anxiety- Will make sure on home doses of Benzo.  12/31- changed to home dose Ativan QID 0.5 mg 19. Respiratory insufficiency  12/30- on 5-6L high flow O2- will wean as tolerated  - will order continuous pulse ox for now  1/1- off O2 this AM while in gym. 20. Azotemia  12/30- BUN is 32 down from 40- will monitor; Cr 1.03 down from 1.32  1/1- Labs in AM 21. Likely AD episode today  12/31- paradoxical breathing, SOB, nasal congestion, anxiety- didn't c/o HA- likely due to constipation    LOS: 3 days A FACE TO FACE EVALUATION WAS PERFORMED  Tamaka Sawin 10/06/2019, 10:00 AM

## 2019-10-06 NOTE — Care Management (Signed)
[facility] Individual Statement of Services  Patient Name:  [name] [name]  Date:  10/06/2019  Welcome to the [facility].  Our goal is to provide you with an individualized program based on your diagnosis and situation, designed to meet your specific needs.  With this comprehensive rehabilitation program, you will be expected to participate in at least 3 hours of rehabilitation therapies Monday-Friday, with modified therapy programming on the weekends.  Your rehabilitation program will include the following services:  Physical Therapy (PT), Occupational Therapy (OT), 24 hour per day rehabilitation nursing, Therapeutic Recreaction (TR), Neuropsychology, Case Management (Social Worker), Rehabilitation Medicine, Nutrition Services and Pharmacy Services  Weekly team conferences will be held on Tuesdays to discuss your progress.  Your Social Worker will talk with you frequently to get your input and to update you on team discussions.  Team conferences with you and your family in attendance may also be held.  Expected length of stay: 10-16 days   Overall anticipated outcome: supervision  Depending on your progress and recovery, your program may change. Your Social Worker will coordinate services and will keep you informed of any changes. Your Social Worker's name and contact numbers are listed  below.  The following services may also be recommended but are not provided by the [facility]:   Driving Evaluations  Home Health Rehabiltiation Services  Outpatient Rehabilitation Services   Arrangements will be made to provide these services after discharge if needed.  Arrangements include referral to agencies that provide these services.  Your insurance has been verified to be:  Medicare; BCBS Your primary doctor is:    Pertinent information will be shared with your doctor and your insurance company.  Social Worker:  [place], [place]  [phone] or ([phone]   Information discussed with and copy given to patient by: Amada Jupiter, 10/06/2019, 11:56 AM

## 2019-10-06 NOTE — Progress Notes (Signed)
Patient was having trouble breathing. He was using more accessory muscles than normal. Vitals were stable. Resp was 24, O2 stat was 97 but patient states he is having trouble breathing. We quickly sat him up in bed and gave him a Duo neb treatment. His work of breathing began to get better and his O2 sats remain in the upper 90s. He did say he feel alseep without his CPAP and he woke up breathing like this. I did remind the patient that he needs to have the CPAP on when he is going to sleep. Patient understood. Patient is sitting up in bed eating dinner.

## 2019-10-06 NOTE — Progress Notes (Signed)
Social Work Assessment and Plan   Patient Details  Name: [name] [name] MRN: [id number] Date of Birth: 12/03/1950  Today's Date: 10/06/2019  Problem List:  Patient Active Problem List   Diagnosis Date Noted  . Cervical myelopathy (HCC) 10/03/2019  . Myelopathy concurrent with and due to spinal stenosis of cervical region (HCC) 09/21/2019  . Paroxysmal atrial fibrillation (HCC) 05/24/2018  . Unstable angina (HCC) 02/04/2018  . Typical atrial flutter (HCC)   . Spinal stenosis   . Sleep apnea   . QT prolongation   . Palpitations   . PAF (paroxysmal atrial fibrillation) (HCC)   . OA (osteoarthritis)   . Hypertension   . HLD (hyperlipidemia)   . Edema   . DJD (degenerative joint disease)   . Coronary artery disease   . Chronic chest pain   . 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 A-fib (HCC) 09/15/2016  . Chest pain 09/09/2016  . Obesity 08/22/2016  . Anxiety 08/22/2016  . Encounter for monitoring sotalol therapy 08/20/2016  . Morbidly obese (HCC) 07/12/2016  . Medical non-compliance 07/12/2016  . Elevated TSH 07/09/2016  . BPH without urinary obstruction 07/09/2016  . Need for hepatitis C screening test 07/09/2016  . Idiopathic gout 07/09/2016  . Hyperglycemia 08/01/2015  . Atrial fibrillation with RVR (HCC) 03/28/2015  . Routine general medical examination at a health care facility 08/11/2013  . Chronic sinus bradycardia   . Obstructive sleep apnea 09/12/2010  . MYOCARDIAL INFARCTION 09/11/2010  . Spinal stenosis, unspecified region other than cervical 09/11/2010  . Hyperlipidemia with target LDL less than 70 09/08/2010  . Depression 09/08/2010  . Essential hypertension 09/08/2010  . Coronary atherosclerosis 09/08/2010  . Congestive heart failure (HCC) 09/08/2010  . GERD 09/08/2010  . MI, old 10/05/2002   Past Medical History:  Past Medical History:  Diagnosis Date  . Anxiety   . CHF (congestive heart failure) (HCC)    h/o due to fluid overload in  recovery room  . Coronary artery disease    2v CABG, 2004 [facility])  . COVID-19   . DJD (degenerative joint disease)   . Dyspnea    on exertion  . GERD (gastroesophageal reflux disease)   . History of kidney stones    3 stones yrs. ago  . HLD (hyperlipidemia)   . Hypertension   . MI, old 2004  . OA (osteoarthritis)   . Obesity   . Persistent atrial fibrillation (HCC)    failed medical therapy with sotalol, s/p PVI x 2  . Sleep apnea    on CPAP  . Spinal stenosis   . Typical atrial flutter [facility])    Past Surgical History:  Past Surgical History:  Procedure Laterality Date  . ANTERIOR CERVICAL DECOMP/DISCECTOMY FUSION [name] 09/21/2019   Procedure: ANTERIOR CERVICAL DECOMPRESSION/DISCECTOMY FUSION CERVICAL THREE- CERVICAL FOUR, CERVICAL FOUR- CERVICAL FIVE, CERVICAL FIVE- CERVICAL SIX;  Surgeon: Tressie Stalker, MD;  Location: [facility] OR;  Service: Neurosurgery;  Laterality: [name];  ANTERIOR CERVICAL DECOMPRESSION/DISCECTOMY FUSION CERVICAL THREE- CERVICAL FOUR, CERVICAL FOUR- CERVICAL FIVE, CERVICAL FIVE- CERVICAL SIX  . ATRIAL FIBRILLATION ABLATION [name] 05/24/2018   Procedure: ATRIAL FIBRILLATION ABLATION;  Surgeon: Hillis Range, MD;  Location: [facility] INVASIVE CV LAB;  Service: Cardiovascular;  Laterality: [name];  . BACK SURGERY  2011  . CARPAL TUNNEL RELEASE     bilateral  . CHOLECYSTECTOMY    . CORONARY ARTERY BYPASS GRAFT     2004  . ELECTROPHYSIOLOGIC STUDY [name] 09/15/2016   Procedure: Atrial Fibrillation Ablation;  Surgeon: Hillis Range, MD;  Location: [facility] INVASIVE CV LAB;  Service: Cardiovascular;  Laterality: [name];  . fistula surgery    . hemorrhoidectomy    . LEFT HEART CATH AND CORS/GRAFTS ANGIOGRAPHY [name] 02/07/2018   Procedure: LEFT HEART CATH AND CORS/GRAFTS ANGIOGRAPHY;  Surgeon: [name], Peter M, MD;  Location: [facility] INVASIVE CV LAB;  Service: Cardiovascular;  Laterality: [name];  . LUMBAR LAMINECTOMY    . right foot fracture    . TEE WITHOUT CARDIOVERSION [name] 09/15/2016   Procedure:  TRANSESOPHAGEAL ECHOCARDIOGRAM (TEE);  Surgeon: Lewayne Bunting, MD;  Location: [facility] ENDOSCOPY;  Service: Cardiovascular;  Laterality: [name];  . TOTAL KNEE ARTHROPLASTY Left 07/07/2017   Procedure: LEFT TOTAL KNEE ARTHROPLASTY;  Surgeon: Ranee Gosselin, MD;  Location: [facility] ORS;  Service: Orthopedics;  Laterality: Left;  . TRANSTHORACIC ECHOCARDIOGRAM  08/25/2010   EF 55-60%   Social History:  reports that he has never smoked. He has never used smokeless tobacco. He reports current alcohol use. He reports that he does not use drugs.  Family / Support Systems Marital Status: Married Patient Roles: Spouse, Parent Spouse/Significant Other: wife, Natanel Snavely @ [phone] Children: daughter, Eilene Ghazi @ [phone] Anticipated Caregiver: wife Ability/Limitations of Caregiver: no limitations Caregiver Availability: 24/7 Family Dynamics: Wife and family very supportive and wife reports that "we have tons of help available to [place]."   She is here daily and very involved in his rehab.  Social History Preferred language: English Religion: Non-Denominational Cultural Background: NA Read: Yes Write: Yes Employment Status: Retired Marine scientist Issues: None Guardian/Conservator: none - per MD, pt is capable of making decisions on his own behalf.   Abuse/Neglect Abuse/Neglect Assessment Can Be Completed: Yes Physical Abuse: Denies Verbal Abuse: Denies Sexual Abuse: Denies Exploitation of patient/patient's resources: Denies Self-Neglect: Denies  Emotional Status Pt's affect, behavior and adjustment status: Pt lying in bed and very fatigued.  Also, reports bowel issues but hopeful to resolve soon.  Pleasant, however, wife does provide majority of the assessment information.  Pt with known h/o anxiety which wife notes began following heart surgery.  Tx recommending neuropsychology consult while here. Recent Psychosocial Issues: none Psychiatric History: see above Substance Abuse  History: none  Patient / Family Perceptions, Expectations & Goals Pt/Family understanding of illness & functional limitations: Pt and wife with good, general understanding of surgery performed and of current functional limitations/ need for CIR. Premorbid pt/family roles/activities: Pt was overall at an independent level, however, wife did assist with some needs due to hand functional weaknesses. Anticipated changes in roles/activities/participation: Little change anticipated is pt able to reach supervision goals overall.  Wife has provided any needed caregiver support PTA and will continue. Pt/family expectations/goals: "I just need him to be able to walker to the bathroom and do basic things."  [employer]: None Premorbid Home Care/DME Agencies: None Transportation available at discharge: yes Resource referrals recommended: Neuropsychology  Discharge Planning Living Arrangements: Spouse/significant other Support Systems: Spouse/significant other, Children, Other relatives, Friends/neighbors Type of Residence: Private residence Insurance Resources: [employer], Media planner (specify)(BCBS) Financial Resources: Restaurant manager, fast food Screen Referred: No Living Expenses: Own Money Management: Spouse Does the patient have any problems obtaining your medications?: No Home Management: pt and wife Patient/Family Preliminary Plans: Pt to d/c home with wife as primary caregiver Social Work Anticipated Follow Up Needs: HH/OP Expected length of stay: 10-16 days  Clinical Impression Pleasant gentleman with wife at bedside who is here following cervical surgery.  Fatigued and wife providing most intake information.  Therapies noting some anxiety for pt and wil lrefer for neuropsychology support.  Will follow for d/c planning needs.  Family able to provide 24/7 assistance.  Lailana Shira 10/06/2019, 11:55 AM

## 2019-10-06 NOTE — IPOC Note (Signed)
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 [facility]) Active Problems:   Obstructive sleep apnea   Congestive heart failure (HCC)   BPH without urinary obstruction   Obesity   Cervical myelopathy (HCC)     Functional Problem List: Nursing Pain, Bladder, Bowel, Edema, Medication Management, Motor  PT Balance, Endurance, Safety, Sensory  OT Balance, Safety, Sensory, Endurance, Motor, Pain  SLP    TR         Basic ADL's: OT Grooming, Eating, Bathing, Dressing, Toileting     Advanced  ADL's: OT       Transfers: PT Bed Mobility, Bed to Chair, Car, Furniture, Floor  OT Toilet, Research scientist (life sciences): PT Ambulation, Psychologist, prison and probation services, Stairs     Additional Impairments: OT Fuctional Use of Upper Extremity  SLP        TR      Anticipated Outcomes Item Anticipated Outcome  Self Feeding MOD I feeding/grooming  Swallowing      Basic self-care  S  Toileting  S   Bathroom Transfers S  Bowel/Bladder  to be continent x 2, LBM 09/30/19  Transfers  Supervision  Locomotion  Supervision with LRAD  Communication     Cognition     Pain  less than 2  Safety/Judgment  to remain fall free while in rehab   Therapy Plan: PT Intensity: Minimum of 1-2 x/day ,45 to 90 minutes PT Frequency: 5 out of 7 days PT Duration Estimated Length of Stay: 10-14 days OT Intensity: Minimum of 1-2 x/day, 45 to 90 minutes OT Frequency: 5 out of 7 days OT Duration/Estimated Length of Stay: 12-16     Due to the current state of emergency, patients may not be receiving their 3-hours of Medicare-mandated therapy.   Team Interventions: Nursing Interventions Patient/Family Education, Disease Management/Prevention,  Discharge Planning, Bladder Management, Pain Management, Bowel Management, Medication Management  PT interventions Ambulation/gait training, Warden/ranger, Community reintegration, Discharge planning, DME/adaptive equipment instruction, Functional mobility training, Neuromuscular re-education, Pain management, Patient/family education, Psychosocial support, Stair training, Therapeutic Activities, Therapeutic Exercise, UE/LE Strength taining/ROM, UE/LE Coordination activities  OT Interventions Discharge planning, Balance/vestibular training, Pain management, Self Care/advanced ADL retraining, Therapeutic Activities, UE/LE Coordination activities, Therapeutic Exercise, Skin care/wound managment, Patient/family education, Functional mobility training, Disease mangement/prevention, Community reintegration, Fish farm manager, Neuromuscular re-education, Psychosocial support, Splinting/orthotics, UE/LE Strength taining/ROM, Wheelchair propulsion/positioning  SLP Interventions    TR Interventions    SW/CM Interventions Discharge Planning, Psychosocial Support, Patient/Family Education   Barriers to Discharge MD  Medical stability, Neurogenic bowel and bladder, Lack of/limited family support, Weight, Weight bearing restrictions, Behavior and New oxygen  Nursing Other (comments) has cpap at home but no 02 if needs for home use  PT New oxygen    OT Incontinence, Weight    SLP      SW       Team Discharge Planning: Destination: PT-Home ,OT- Home , SLP-  Projected Follow-up: PT-Outpatient PT, OT-  Outpatient OT, SLP-  Projected Equipment Needs: PT-Rolling walker with 5" wheels, OT- 3 in 1 bedside comode, Tub/shower seat, SLP-  Equipment Details: PT- , OT-  Patient/family involved in discharge planning: PT- Patient, Family member/caregiver,  OT-Patient, SLP-   MD ELOS: 10-14 days Medical Rehab Prognosis:  Good Assessment:  Pt is a 69 yr old male with 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  Goals- supervision in 10-14 days      See Team Conference Notes for weekly updates to the plan of care

## 2019-10-06 NOTE — Progress Notes (Signed)
2Occupational Therapy Session Note  Patient Details  Name: [name] [name] MRN: [id number] Date of Birth: October 19, 1950  Today's Date: 10/06/2019  Session 1 OT Individual Time: 3419-3790 OT Individual Time Calculation (min): 69 min   Session 2 OT Individual Time: 2409-7353 OT Individual Time Calculation (min): 54 min    Short Term Goals: Week 1:  OT Short Term Goal 1 (Week 1): Pt will complete toilet transfer wiht CGA OT Short Term Goal 2 (Week 1): Pt will thread head into shirt wiht AE PRN OT Short Term Goal 3 (Week 1): Pt will thread BLE into pants wiht AE PRN OT Short Term Goal 4 (Week 1): Pt will groom in stanidng with CGA for improved endurnace  Skilled Therapeutic Interventions/Progress Updates:  Session 1   Pt greeted sitting in recliner and agreeable to OT treatment session focused on self-care retraining. Discussed showering with nursing and pt cleared to shower with c-collar on. OT made sure pt had spare pads prior to shower. Pt sitting in recliner without c-collar on. C-collar donned with OT assistance. Pt ambulated CGA with RW to bathroom and transferred onto commode+verbal cues for RW management when turning. Pt with successful BM and was able to complete peri-care with min A. Pt then ambulated to shower chair in similar fashion. Bathing completed with min/mod A to wash LEs and assistance to bring UEs to head to wash hair. Pt very fatigued after shower and needed extended rest break and encouragement to then finish getting dressed and brush his teeth. Pt able to achieve figure 4 position with R LE and min A in order to thread pant legs. Sit<>stand at the sink with CGA, then needed min A to pull pants up 2/2 fatigue and UE weakness. OT educated pt's spouse on modified technique to donning TED hose using friction reducing bag. OT provided pt with foam built up handles for self-feeding lunch. Pt left sitting in recliner with alarm belt on, spouse present, and needs met.   Session 2 Pt  greeted in the bathroom with nurse techn present. Pt with successful BM and voided bladder. Pt able to complete peri-care with CGA. Sit<>stand from standard height commode with min A and use of grab bars. Pt ambulated to the sink with CGA and education for RW positioning around the isnk while washing hands. Pt reported improved grip during lunch with built up handles. Pt pushed down to therapy gym in wc for time management. OT issued red soft theraputty and went through hand exercises focused on grip and pinch strength. Pt unable to supinate L hand 2/2 multiple breaks years ago. Worked on functional pinch with pinching medium sized foam cubes and placing into cups. OT provided proximal support and facilitation fo rnormal movement patterns and pt compemnsates for weakness with shoulder hike. Worked on shoulder strength and ROM with finger crawls across table 10x on each UE. Pt returned to room in wc and ambulated back to recliner with RW, min A to stand and CGA for ambulation. Pt left seated in recliner with alarm belt on, spouse present, and needs met.   Therapy Documentation Precautions:  Precautions Precautions: Cervical Precaution Booklet Issued: Yes (comment) Precaution Comments: verbal discussion of precautions Required Braces or Orthoses: Cervical Brace Cervical Brace: Hard collar Restrictions Weight Bearing Restrictions: No Pain: Pt reports pain controlled at a 2. Repositioned and rest for comfort.  Therapy/Group: Individual Therapy  Valma Cava 10/06/2019, 3:00 PM

## 2019-10-06 NOTE — Progress Notes (Signed)
Physical Therapy Session Note  Patient Details  Name: [name] [name] MRN: [id number] Date of Birth: [birth year]  Today's Date: 10/06/2019 PT Individual Time: 0805-0900 PT Individual Time Calculation (min): 55 min   Short Term Goals: Week 1:  PT Short Term Goal 1 (Week 1): Pt will ambulate x 100 ft with LRAD and min A PT Short Term Goal 2 (Week 1): Pt will navigate 4 stairs with 1 handrail and min A PT Short Term Goal 3 (Week 1): Pt will perform bed mobility with Supervision  Skilled Therapeutic Interventions/Progress Updates:   Pt received supine in bed and agreeable to PT. Supine>sit transfer with min assist at trunk with cues to maintain cervical precautions. Pt reports need for urination. Ambulatory transfer to toilet but unable to void bladder.   Kinetron reciprocal endurance training 4 x 45sec with constant SpO2 monitoring >90% throughout with min cues for pursed lip breathing .  Gait training with RW and CGA from PT for safety x 64f and 558f Min cues for increased step height and AD management in turns to reduce fall risk.   Dynamic Balance training and fine motor control through hand to perform lateral reach to obtain horse shoe and then toss to target, 1-0 UE throughout and min assist from PT to improve safety with cues for ankle strategy to prevent lateral LOB.   UE shoulder flexion 2 x 15 Bil with moderate cues for improved parascapular activation to improve ROM and neuromotor control . [facility] returned to [facility] and [facility] and all needs met.         Therapy Documentation Precautions:  Precautions Precautions: Cervical Precaution Booklet Issued: Yes (comment) Precaution Comments: verbal discussion of precautions Required Braces or Orthoses: Cervical Brace Cervical Brace: Hard collar Restrictions Weight Bearing Restrictions: No   Pain: Pain Assessment Pain Scale: 0-10 Pain Score: 0-No pain    Therapy/Group: Individual  Therapy  AuLorie Phenix/10/2019, 12:45 PM

## 2019-10-07 ENCOUNTER — Inpatient Hospital Stay (HOSPITAL_COMMUNITY): Payer: Medicare Other | Admitting: Physical Therapy

## 2019-10-07 ENCOUNTER — Inpatient Hospital Stay (HOSPITAL_COMMUNITY): Payer: Medicare Other | Admitting: Occupational Therapy

## 2019-10-07 LAB — CBC WITH DIFFERENTIAL/PLATELET
Abs Immature Granulocytes: 0.34 10*3/uL — ABNORMAL HIGH (ref 0.00–0.07)
Basophils Absolute: 0.1 10*3/uL (ref 0.0–0.1)
Basophils Relative: 0 %
Eosinophils Absolute: 0.1 10*3/uL (ref 0.0–0.5)
Eosinophils Relative: 1 %
HCT: 38.7 % — ABNORMAL LOW (ref 39.0–52.0)
Hemoglobin: 13.4 g/dL (ref 13.0–17.0)
Immature Granulocytes: 2 %
Lymphocytes Relative: 11 %
Lymphs Abs: 1.8 10*3/uL (ref 0.7–4.0)
MCH: 29.5 pg (ref 26.0–34.0)
MCHC: 34.6 g/dL (ref 30.0–36.0)
MCV: 85.2 fL (ref 80.0–100.0)
Monocytes Absolute: 1.4 10*3/uL — ABNORMAL HIGH (ref 0.1–1.0)
Monocytes Relative: 9 %
Neutro Abs: 12.3 10*3/uL — ABNORMAL HIGH (ref 1.7–7.7)
Neutrophils Relative %: 77 %
Platelets: 275 10*3/uL (ref 150–400)
RBC: 4.54 MIL/uL (ref 4.22–5.81)
RDW: 13.2 % (ref 11.5–15.5)
WBC: 16.1 10*3/uL — ABNORMAL HIGH (ref 4.0–10.5)
nRBC: 0 % (ref 0.0–0.2)

## 2019-10-07 LAB — BASIC METABOLIC PANEL
Anion gap: 10 (ref 5–15)
BUN: 23 mg/dL (ref 8–23)
CO2: 27 mmol/L (ref 22–32)
Calcium: 9.2 mg/dL (ref 8.9–10.3)
Chloride: 99 mmol/L (ref 98–111)
Creatinine, Ser: 1.15 mg/dL (ref 0.61–1.24)
GFR calc Af Amer: >60 mL/min (ref >60)
GFR calc non Af Amer: >60 mL/min (ref >60)
Glucose, Bld: 114 mg/dL — ABNORMAL HIGH (ref 70–99)
Potassium: 4 mmol/L (ref 3.5–5.1)
Sodium: 136 mmol/L (ref 135–145)

## 2019-10-07 NOTE — Progress Notes (Signed)
[place] PHYSICAL MEDICINE & REHABILITATION PROGRESS NOTE   Subjective/Complaints:  Pt reports he's feeling OK- seen on way to therapy/gym- notes had SOB last night per wife who I spoke to- sounds like anxiety? Vs sleeping without BiPAP.  Per PT, went to gym and worked out the entire time without O2 however went back to room, laid down and said he felt SOB and asked to be put back on O2 and then BiPAP due to SOB. Abrupt- but O2 Sats looked good the entire time- before, during and after per nursing.  Pt's wife asked for Cardiology- Dr Rayann Heman be called because "pt doesn't feel right". Called Cards on call- they asked for EKG- since pt's pulse rate (in Afib) on Pulse Ox machine 40-s high 60s per nursing, bouncing all over, but on lower side.  Waiting for EKG results and will call Cards back if concerning.   ROS- pt denied  CP, HA, N/V/D; abd pain/fullness . Denies SOB Objective:   No results found. Recent Labs    10/07/19 0341  WBC 16.1*  HGB 13.4  HCT 38.7*  PLT 275   Recent Labs    10/07/19 0341  NA 136  K 4.0  CL 99  CO2 27  GLUCOSE 114*  BUN 23  CREATININE 1.15  CALCIUM 9.2    Intake/Output Summary (Last 24 hours) at 10/07/2019 1130 Last data filed at 10/07/2019 0834 Gross per 24 hour  Intake 560 ml  Output -  Net 560 ml     Physical Exam: Vital Signs Blood pressure (!) 140/59, pulse 85, temperature 98.2 F (36.8 C), temperature source Axillary, resp. rate 20, height 5\' 6"  (1.676 m), weight 102.2 kg, SpO2 97 %.  Constitutional:  Obese, barrel chested male on mat;sitting up in manual w/c, with PT, also saw wife; appears intermittently anxious;NAD HENT:  Head: Atraumatic.  Mouth/Throat: Oropharynx is clear  Eyes:  EOM are normal.Sclera are erythematous -mild-moderate B/L Neck:  Incision on anterior neck-, but appears to be glued- a lot of bruising and ecchymosis  Cardiovascular: rate in 60s; in A Fib Respiratory: CTA B/L- no M/R/G GI: soft, NT, ND, (+)BS   Musculoskeletal:  Comments: RUE bicep 4+/5, WE 4+/5, triceps 4+/5, grip 4+/5, and finger abd 4+/5 LUE- biceps 3+/5, WE 3+/5, triceps 3/5, grip 3/5, finger abd 3-/5 RLE- HF 5-/5, KE 4+/5, DF 5-/5, PF 5-/5, EHL 4+/5 LLE- HF 4-/5, KE 4/5, DF 4-/5, PF 4/5, EHL 4-/5  Neurological: . Oriented x3. Hoffman's B/L in UEs- no clonus B/L Decreased sensation to light touch L C5-T1 otherwise intact to light touch in all 3 other extremities and torso B/L Skin:  Surgical site neck hematoma resolving. Noted ecchymosis at site. Psychiatric:  Brighter affect; much less anxiety   Assessment/Plan: 1. 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  Physiatrist is providing close team supervision and 24 hour management of active medical problems listed below.  Physiatrist and rehab team continue to assess barriers to discharge/monitor patient progress toward functional and medical goals  Care Tool:  Bathing    Body parts bathed by patient: Left arm, Chest, Abdomen, Front perineal area, Right upper leg, Left upper leg, Face   Body parts bathed by helper: Buttocks     Bathing assist Assist Level: Moderate Assistance - Patient 50 - 74%     Upper Body Dressing/Undressing Upper body dressing   What is the patient wearing?: Pull over shirt  Upper body assist Assist Level: Maximal Assistance - Patient 25 - 49%    Lower Body Dressing/Undressing Lower body dressing      What is the patient wearing?: Pants     Lower body assist Assist for lower body dressing: Minimal Assistance - Patient > 75%     Toileting Toileting    Toileting assist Assist for toileting: Contact Guard/Touching assist     Transfers Chair/bed transfer  Transfers assist     Chair/bed transfer assist level: Contact Guard/Touching assist Chair/bed transfer assistive device: Walker, Recruitment consultant   Ambulation assist      Assist level: Contact Guard/Touching assist Assistive device: Walker-rolling Max distance: 80   Walk 10 feet activity   Assist     Assist level: Contact Guard/Touching assist Assistive device: Walker-rolling   Walk 50 feet activity   Assist    Assist level: Minimal Assistance - Patient > 75% Assistive device: Walker-rolling    Walk 150 feet activity   Assist Walk 150 feet activity did not occur: Safety/medical concerns         Walk 10 feet on uneven surface  activity   Assist Walk 10 feet on uneven surfaces activity did not occur: Safety/medical concerns         Wheelchair     Assist Will patient use wheelchair at discharge?: No             Wheelchair 50 feet with 2 turns activity    Assist            Wheelchair 150 feet activity     Assist          Blood pressure (!) 140/59, pulse 85, temperature 98.2 F (36.8 C), temperature source Axillary, resp. rate 20, height 5\' 6"  (1.676 m), weight 102.2 kg, SpO2 97 %. Problem List and Plan:  1. 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 Soft Cervical collar when OOB  -patient may shower if cover cervical incision  -ELOS/Goals: 16-20 days/Goals Supervision- CGA  2. Antithrombotics:  -DVT/anticoagulation: Resume Eliquis 5 mg twice daily as prior to admission; off heparin gtt  -antiplatelet therapy: [name]  3. Pain Management: Flexeril and oxycodone as needed  4. Mood: Zoloft 50 mg daily, Klonopin 0.25 mg twice daily- will check if was changed from dose at home;   12/31- changed to Ativan QID 0.5 mg- home dose -antipsychotic agents: [name]  5. Neuropsych: This patient is capable of making decisions on his own behalf.  6. Skin/Wound Care: Routine skin  checks  7. Fluids/Electrolytes/Nutrition: Routine in and outs with follow-up chemistries  8. History of atrial fibrillation. Patient transitioned from intravenous heparin back to chronic Eliquis. Cardiac rate controlled. Continue Cardizem 100 mg daily, Lopressor 12.5 mg twice daily   1/2- rate on low side this AM- will check EKG after d/w Cardiology. Will determine plan once done. 9. Hypertension. Aldactone 25 mg daily, hydralazine 75 mg 3 times daily. Monitor with increased mobility  10. History of CAD with CABG 2004. No complaints of increasing chest pain.  11. Leukocytosis. Felt to be steroid induced. Prednisone taper completed. Follow-up CBC   12/30- down to 21.7 form 28k  1/2- WBC down to 16.1k- doing great 12. Diastolic congestive heart failure. Monitor for any signs of fluid overload   12/30- no weights yet  1/1- didn't have daily weights- changed order   Filed  Weights   10/07/19 0403  Weight: 102.2 kg    13. OSA. Currently on BiPAP.   12/30- back on BiPAP last night- will ask reps therapy to address loose fitting mask.  1/1- doing BiPAP setting here- will speak with reps therapy plan. 14. Hyperlipidemia. Lipitor  15. BPH and neurogenic bladder. Flomax 0.8 mg daily. Check PVRs for 3 days and cath if necessary  16. Constipation/Neurogenic bowel. Laxative assistance; will clean out and see if has control of stool and if not, will put on bowel program.   12/31- will order Mg citrate and then 3-4 hours later, an enema to clear him out. KUB wasn't real helpful, but had more stool when assessed it myself than discussed.  1/1- cleaned out- 2 very large BMs- feeling better 17. GERD. Protonix  18. Anxiety- Will make sure on home doses of Benzo.  12/31- changed to home dose Ativan QID 0.5 mg 19. Respiratory insufficiency  12/30- on 5-6L high flow O2- will wean as tolerated  - will order continuous pulse ox for now  1/1- off O2 this AM while in gym.  1/2- was off for PT this AM- asking to  go back on this AM- will get EKG per Cards 20. Azotemia  12/30- BUN is 32 down from 40- will monitor; Cr 1.03 down from 1.32  1/1- Labs in AM 21. Likely AD episode today  12/31- paradoxical breathing, SOB, nasal congestion, anxiety- didn't c/o HA- likely due to constipation    Spoke to wife today x 15 minutes, patient, and cardiology- total care today 40 minutes    LOS: 4 days A FACE TO FACE EVALUATION WAS PERFORMED  Katey Barrie 10/07/2019, 11:30 AM

## 2019-10-07 NOTE — Progress Notes (Signed)
Patient's wife anxious about patient's HR when sleeping 47-59/ min . Patient has AF . RN explained to wife that patient is resting at this time and with his AF HR will vary. Patient requested bipap while resting earlier. Dr. Berline Chough notified.

## 2019-10-07 NOTE — Progress Notes (Signed)
Occupational Therapy Session Note  Patient Details  Name: [name] [name] MRN: [id number] Date of Birth: [birth year]  Today's Date: 10/07/2019 OT Individual Time: 0814-4818   &  1300-1400 OT Individual Time Calculation (min): 45 min & 60 min   Short Term Goals: Week 1:  OT Short Term Goal 1 (Week 1): Pt will complete toilet transfer wiht CGA OT Short Term Goal 2 (Week 1): Pt will thread head into shirt wiht AE PRN OT Short Term Goal 3 (Week 1): Pt will thread BLE into pants wiht AE PRN OT Short Term Goal 4 (Week 1): Pt will groom in stanidng with CGA for improved endurnace  Skilled Therapeutic Interventions/Progress Updates:    am session:  Patient lying in bed, reclined position, O2 via [place] with humidification - using accessory muscles to breath - breathing easier upon sitting edge of bed.  Cervical collar donned.  Patient is alert and aware of needs.  He states that he did not sleep well last night but is willing to participate in therapy session.  Bed mobility with min A.  Sit to stand and SPT with RW to/from bed & w/c with CGA.  Trail of standing activity completed in room without O2 - patient remained at 92-95% saturation without O2 - spot checked t/o session without change and no c/o of difficulty breathing.  Completed standing balance and endurance activities with RW CGA.  Completed scapular mobility and light scapular/shoulder stretch below 90 degrees with noted relief.  Ambulation with RW 30 feet CGA.  He returned to bed at close of session due to fatigue - placed back on O2 at prior setting and monitor turned on as he planned to attempt to sleep.  Nursing aware, bed alarm set and call bell in reach.    Pm session:  Wife present, patient in bed, alert and ready for therapy session.  Nursing present providing meds and O2 turned off.  O2 saturation remained between 92-94%.  Bed mobility with min A, cervical collar donned.  SPT with RW to w/c CGA.  Patient able to feed himself with standard  utensils after set up.  Sponge bath at sink with assist needed for thoroughness at face due to limited shoulder mobility, bilateral UEs, buttocks and bilateral lower legs.  He is able to stand with CGA for washing peri area and buttocks.  UB dressing with mod A, LB dressing mod A.   Dependent for footwear.  Grooming tasks mod/max A for applying deodarant and shaving with electric razor, he is able to perform oral care with set up.  He remained seated in w/c at close of session, saturation monitor on, call bell in reach - nursing aware.    Therapy Documentation Precautions:  Precautions Precautions: Cervical Precaution Booklet Issued: Yes (comment) Precaution Comments: verbal discussion of precautions Required Braces or Orthoses: Cervical Brace Cervical Brace: Hard collar Restrictions Weight Bearing Restrictions: No General:   Vital Signs: Therapy Vitals BP: (!) 127/49 Pain: Pain Assessment Pain Scale: 0-10 Pain Score: 0-No pain   Therapy/Group: Individual Therapy  [name] [name] 10/07/2019, 3:35 PM

## 2019-10-07 NOTE — Progress Notes (Signed)
Physical Therapy Session Note  Patient Details  Name: [name] [name] MRN: [id number] Date of Birth: 1951/01/22  Today's Date: 10/07/2019 PT Individual Time: 1415-1525 PT Individual Time Calculation (min): 70 min   Short Term Goals: Week 1:  PT Short Term Goal 1 (Week 1): Pt will ambulate x 100 ft with LRAD and min A PT Short Term Goal 2 (Week 1): Pt will navigate 4 stairs with 1 handrail and min A PT Short Term Goal 3 (Week 1): Pt will perform bed mobility with Supervision  Skilled Therapeutic Interventions/Progress Updates:    Pt received seated in w/c in room, agreeable to PT session. No complaints of pain. Seated SpO2 92% at rest on room air, BP 127/49, HR 47. RN provides pt with PM medications at beginning of session. Dependent transport via w/c to therapy gym for time conservation. Sit to stand with CGA to RW throughout session, v/c for safe equipment management. Ambulation 2 x 140 ft with RW at CGA level. Pt exhibits improved endurance for gait training this date. Pt does require v/c for safe RW management especially with turns and when sitting down. Ascend/descend one 4" curb with RW and min A with cues for safe RW management technique x 4 reps to simulate threshold to enter patient's home. Standing balance with 0-1 UE support on RW and CGA for balance while placing level blue clothespin on basketball net overhead, AAROM for B shoulder elevation to reach clothespins. Pt fatigues quickly with this activity and continues to exhibit decreased shoulder flexion and elevation against gravity. Standing balance while shooting basketball hoops with use of BUE and CGA for balance for BUE strengthening. Pt reports feeling fatigued following standing activities and requests to return to room, agreeable to ambulate back to room. Pt is able to ambulate from therapy gym back to his room with RW and CGA for balance. Pt on room air throughout session, SpO2 remains at 95% and above with activity. Pt requests to  sit up in recliner at end of session. Pt left reclined in recliner with needs in reach, wife present at end of session.  Therapy Documentation Precautions:  Precautions Precautions: Cervical Precaution Booklet Issued: Yes (comment) Precaution Comments: verbal discussion of precautions Required Braces or Orthoses: Cervical Brace Cervical Brace: Hard collar Restrictions Weight Bearing Restrictions: No    Therapy/Group: Individual Therapy   Peter Congo, PT, DPT  10/07/2019, 3:31 PM

## 2019-10-08 ENCOUNTER — Inpatient Hospital Stay (HOSPITAL_COMMUNITY): Payer: Medicare Other | Admitting: Occupational Therapy

## 2019-10-08 NOTE — Progress Notes (Signed)
Occupational Therapy Session Note  Patient Details  Name: [name] [name] MRN: [id number] Date of Birth: 1950/11/27  Today's Date: 10/08/2019 OT Individual Time: 3419-6222 OT Individual Time Calculation (min): 54 min    Short Term Goals: Week 1:  OT Short Term Goal 1 (Week 1): Pt will complete toilet transfer wiht CGA OT Short Term Goal 2 (Week 1): Pt will thread head into shirt wiht AE PRN OT Short Term Goal 3 (Week 1): Pt will thread BLE into pants wiht AE PRN OT Short Term Goal 4 (Week 1): Pt will groom in stanidng with CGA for improved endurnace  Skilled Therapeutic Interventions/Progress Updates:    Upon entering the room, pt seated in recliner chair with wife present in room. Pt with no c/o pain this session. Pt requesting to brush teeth and very impulsive with mobility upon standing and turning to sink without use of RW and min A for safety. Pt standing for 4 minutes on RA with O2 dropping to 82% and HR increasing to 192 bpm. Pt verbalized not feeling well and returning to recliner chair. Pt and caregiver had requested pt to shower but based on vitals and symptoms it is not safe at this time. OT notified RN and while therapist out of room caregiver assisted pt with UB self care while he remained seated. OT returning to room and pt standing with min A and use of RW for LB self care. Pt returning to recliner chair as he felt he could "catch his breath" better sitting up. Caregiver was able to demonstrate changing collar pads with min cuing for technique. RN arrived with medications and to answer further questions. Call bell and all needs within reach upon exiting the room.   Therapy Documentation Precautions:  Precautions Precautions: Cervical Precaution Booklet Issued: Yes (comment) Precaution Comments: verbal discussion of precautions Required Braces or Orthoses: Cervical Brace Cervical Brace: Hard collar Restrictions Weight Bearing Restrictions: No General:   Vital  Signs: Therapy Vitals Pulse Rate: (!) 44 Resp: 20 BP: 117/60 Oxygen Therapy SpO2: 96 % O2 Device: Nasal Cannula   Therapy/Group: Individual Therapy  Alen Bleacher 10/08/2019, 4:40 PM

## 2019-10-08 NOTE — Progress Notes (Signed)
Patient has been having SOB today relieved with neb treatment and bipap when he is sleeping. Continued to monitor.

## 2019-10-08 NOTE — Progress Notes (Signed)
[place] PHYSICAL MEDICINE & REHABILITATION PROGRESS NOTE   Subjective/Complaints:  Went over EKG results yesterday- in Afib but Rosario Adie is 13- great rate.  Pt reports when he's OOB in w/c/up, no problems with SOB, but when lays in bed, maybe when puts pressure on lungs, has more SOB.   ROS- pt denied  CP, HA, N/V/D; abd pain/fullness . Denies SOB when up Objective:   No results found. Recent Labs    10/07/19 0341  WBC 16.1*  HGB 13.4  HCT 38.7*  PLT 275   Recent Labs    10/07/19 0341  NA 136  K 4.0  CL 99  CO2 27  GLUCOSE 114*  BUN 23  CREATININE 1.15  CALCIUM 9.2    Intake/Output Summary (Last 24 hours) at 10/08/2019 1050 Last data filed at 10/08/2019 0758 Gross per 24 hour  Intake 650 ml  Output 275 ml  Net 375 ml     Physical Exam: Vital Signs Blood pressure (!) 151/86, pulse 88, temperature 98.2 F (36.8 C), temperature source Oral, resp. rate (!) 24, height 5\' 6"  (1.676 m), weight 102 kg, SpO2 97 %.  Constitutional:  Obese, barrel chested man laying in bed; was taking nap with O2 in place by [place]; not CPAP/BiPAP; NAD HENT:  Head: Atraumatic.  Mouth/Throat: Oropharynx is clear  Eyes:  EOM are normal.  Neck:  Incision on anterior neck-, but appears to be glued- a lot of bruising and ecchymosis which is resolving Cardiovascular: rate in 60s; in A Fib Respiratory: CTA B/L- no W/R/R even when laying down GI: soft, NT, ND, (+)BS  Musculoskeletal:  Comments: RUE bicep 4+/5, WE 4+/5, triceps 4+/5, grip 4+/5, and finger abd 4+/5 LUE- biceps 3+/5, WE 3+/5, triceps 3/5, grip 3/5, finger abd 3-/5 RLE- HF 5-/5, KE 4+/5, DF 5-/5, PF 5-/5, EHL 4+/5 LLE- HF 4-/5, KE 4/5, DF 4-/5, PF 4/5, EHL 4-/5  Neurological: . Oriented x3. Hoffman's B/L in UEs- no clonus B/L Decreased sensation to light touch L C5-T1 otherwise intact to light touch in all 3 other extremities and torso B/L Skin:  Surgical site neck hematoma resolving. Noted ecchymosis at site. Psychiatric:   Brighter affect; much less anxiety   Assessment/Plan: 1. 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  Physiatrist is providing close team supervision and 24 hour management of active medical problems listed below.  Physiatrist and rehab team continue to assess barriers to discharge/monitor patient progress toward functional and medical goals  Care Tool:  Bathing    Body parts bathed by patient: Left arm, Chest, Abdomen, Front perineal area, Right upper leg, Left upper leg, Face   Body parts bathed by helper: Buttocks, Right arm, Left arm, Face, Right lower leg, Left lower leg     Bathing assist Assist Level: Moderate Assistance - Patient 50 - 74%     Upper Body Dressing/Undressing Upper body dressing   What is the patient wearing?: Pull over shirt    Upper body assist Assist Level: Maximal Assistance - Patient 25 - 49%    Lower Body Dressing/Undressing Lower body dressing      What is the patient wearing?: Pants     Lower body assist Assist for lower body dressing: Moderate Assistance - Patient 50 - 74%     Toileting Toileting    Toileting assist Assist for toileting: Contact Guard/Touching assist     Transfers Chair/bed transfer  Transfers assist  Chair/bed transfer assist level: Contact Guard/Touching assist Chair/bed transfer assistive device: Armrests, Programmer, multimedia   Ambulation assist      Assist level: Contact Guard/Touching assist Assistive device: Walker-rolling Max distance: 140'   Walk 10 feet activity   Assist     Assist level: Contact Guard/Touching assist Assistive device: Walker-rolling   Walk 50 feet activity   Assist    Assist level: Contact Guard/Touching assist Assistive device: Walker-rolling    Walk 150 feet activity   Assist Walk 150 feet activity did not occur:  Safety/medical concerns         Walk 10 feet on uneven surface  activity   Assist Walk 10 feet on uneven surfaces activity did not occur: Safety/medical concerns         Wheelchair     Assist Will patient use wheelchair at discharge?: No             Wheelchair 50 feet with 2 turns activity    Assist            Wheelchair 150 feet activity     Assist          Blood pressure (!) 151/86, pulse 88, temperature 98.2 F (36.8 C), temperature source Oral, resp. rate (!) 24, height 5\' 6"  (1.676 m), weight 102 kg, SpO2 97 %. Problem List and Plan:  1. 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 Soft Cervical collar when OOB  -patient may shower if cover cervical incision  -ELOS/Goals: 16-20 days/Goals Supervision- CGA  2. Antithrombotics:  -DVT/anticoagulation: Resume Eliquis 5 mg twice daily as prior to admission; off heparin gtt  -antiplatelet therapy: [name]  3. Pain Management: Flexeril and oxycodone as needed  4. Mood: Zoloft 50 mg daily, Klonopin 0.25 mg twice daily- will check if was changed from dose at home;   12/31- changed to Ativan QID 0.5 mg- home dose -antipsychotic agents: [name]  5. Neuropsych: This patient is capable of making decisions on his own behalf.  6. Skin/Wound Care: Routine skin checks  7. Fluids/Electrolytes/Nutrition: Routine in and outs with follow-up chemistries  8. History of atrial fibrillation. Patient transitioned from intravenous heparin back to chronic Eliquis. Cardiac rate controlled. Continue Cardizem 100 mg daily, Lopressor 12.5 mg twice daily   1/2- rate on low side this AM- will check EKG after d/w Cardiology. Will determine plan once done.  1/3- EKG looks good- will con't regimen 9. Hypertension. Aldactone 25 mg daily, hydralazine 75 mg  3 times daily. Monitor with increased mobility  10. History of CAD with CABG 2004. No complaints of increasing chest pain.  11. Leukocytosis. Felt to be steroid induced. Prednisone taper completed. Follow-up CBC   12/30- down to 21.7 form 28k  1/2- WBC down to 16.1k- doing great 12. Diastolic congestive heart failure. Monitor for any signs of fluid overload   12/30- no weights yet  1/1- didn't have daily weights- changed order   Filed Weights   10/07/19 0403 10/08/19 0521  Weight: 102.2 kg 102 kg   1/3- weight stable  13. OSA. Currently on BiPAP.   12/30- back on BiPAP last night- will ask reps therapy to address loose fitting mask.  1/1- doing BiPAP setting here- will speak with reps therapy plan. 14. Hyperlipidemia. Lipitor  15. BPH and neurogenic bladder. Flomax 0.8 mg daily. Check PVRs for 3 days and cath if necessary  16. Constipation/Neurogenic bowel. Laxative assistance; will clean out and see if has control of stool and if not, will put on bowel program.   12/31- will order Mg citrate and then 3-4 hours later, an enema to clear him out. KUB wasn't real helpful, but had more stool when assessed it myself than discussed.  1/1- cleaned out- 2 very large BMs- feeling better 17. GERD. Protonix  18. Anxiety- Will make sure on home doses of Benzo.  12/31- changed to home dose Ativan QID 0.5 mg 19. Respiratory insufficiency  12/30- on 5-6L high flow O2- will wean as tolerated  - will order continuous pulse ox for now  1/1- off O2 this AM while in gym.  1/2- was off for PT this AM- asking to go back on this AM- will get EKG per Cards 20. Azotemia  12/30- BUN is 32 down from 40- will monitor; Cr 1.03 down from 1.32  1/1- Labs in AM 21. Likely AD episode today  12/31- paradoxical breathing, SOB, nasal congestion, anxiety- didn't c/o HA- likely due to constipation        LOS: 5 days A FACE TO FACE EVALUATION WAS PERFORMED  Jj Enyeart 10/08/2019, 10:50 AM

## 2019-10-09 ENCOUNTER — Inpatient Hospital Stay (HOSPITAL_COMMUNITY): Payer: Medicare Other | Admitting: Physical Therapy

## 2019-10-09 ENCOUNTER — Inpatient Hospital Stay (HOSPITAL_COMMUNITY): Payer: Medicare Other

## 2019-10-09 ENCOUNTER — Encounter (HOSPITAL_COMMUNITY): Payer: Medicare Other | Admitting: Psychology

## 2019-10-09 DIAGNOSIS — F411 Generalized anxiety disorder: Secondary | ICD-10-CM

## 2019-10-09 DIAGNOSIS — G4733 Obstructive sleep apnea (adult) (pediatric): Secondary | ICD-10-CM

## 2019-10-09 LAB — COMPREHENSIVE METABOLIC PANEL
ALT: 26 U/L (ref 0–44)
AST: 18 U/L (ref 15–41)
Albumin: 2.8 g/dL — ABNORMAL LOW (ref 3.5–5.0)
Alkaline Phosphatase: 81 U/L (ref 38–126)
Anion gap: 11 (ref 5–15)
BUN: 18 mg/dL (ref 8–23)
CO2: 26 mmol/L (ref 22–32)
Calcium: 9.3 mg/dL (ref 8.9–10.3)
Chloride: 101 mmol/L (ref 98–111)
Creatinine, Ser: 1.16 mg/dL (ref 0.61–1.24)
GFR calc Af Amer: >60 mL/min (ref >60)
GFR calc non Af Amer: >60 mL/min (ref >60)
Glucose, Bld: 117 mg/dL — ABNORMAL HIGH (ref 70–99)
Potassium: 4.1 mmol/L (ref 3.5–5.1)
Sodium: 138 mmol/L (ref 135–145)
Total Bilirubin: 0.8 mg/dL (ref 0.3–1.2)
Total Protein: 6.4 g/dL — ABNORMAL LOW (ref 6.5–8.1)

## 2019-10-09 LAB — CBC WITH DIFFERENTIAL/PLATELET
Abs Immature Granulocytes: 0.12 10*3/uL — ABNORMAL HIGH (ref 0.00–0.07)
Basophils Absolute: 0.1 10*3/uL (ref 0.0–0.1)
Basophils Relative: 0 %
Eosinophils Absolute: 0.1 10*3/uL (ref 0.0–0.5)
Eosinophils Relative: 1 %
HCT: 39.8 % (ref 39.0–52.0)
Hemoglobin: 13.3 g/dL (ref 13.0–17.0)
Immature Granulocytes: 1 %
Lymphocytes Relative: 7 %
Lymphs Abs: 1 10*3/uL (ref 0.7–4.0)
MCH: 29.6 pg (ref 26.0–34.0)
MCHC: 33.4 g/dL (ref 30.0–36.0)
MCV: 88.4 fL (ref 80.0–100.0)
Monocytes Absolute: 1 10*3/uL (ref 0.1–1.0)
Monocytes Relative: 7 %
Neutro Abs: 12.1 10*3/uL — ABNORMAL HIGH (ref 1.7–7.7)
Neutrophils Relative %: 84 %
Platelets: 240 10*3/uL (ref 150–400)
RBC: 4.5 MIL/uL (ref 4.22–5.81)
RDW: 13.5 % (ref 11.5–15.5)
WBC: 14.4 10*3/uL — ABNORMAL HIGH (ref 4.0–10.5)
nRBC: 0 % (ref 0.0–0.2)

## 2019-10-09 NOTE — Progress Notes (Signed)
[facility] PHYSICAL MEDICINE & REHABILITATION PROGRESS NOTE   Subjective/Complaints: Denies pain. Says he is able to fall asleep easily and sleeps solidly for 6-7 hours, but then wakes with the sensation that he is having SOB, describes panic. This did not happen to him in acute care side or at home.  ROS- pt denied  CP, HA, N/V/D; abd pain/fullness . Denies SOB when up Objective:   No results found. Recent Labs    10/07/19 0341 10/09/19 0520  WBC 16.1* 14.4*  HGB 13.4 13.3  HCT 38.7* 39.8  PLT 275 240   Recent Labs    10/07/19 0341 10/09/19 0520  NA 136 138  K 4.0 4.1  CL 99 101  CO2 27 26  GLUCOSE 114* 117*  BUN 23 18  CREATININE 1.15 1.16  CALCIUM 9.2 9.3    Intake/Output Summary (Last 24 hours) at 10/09/2019 0930 Last data filed at 10/09/2019 0700 Gross per 24 hour  Intake 120 ml  Output 600 ml  Net -480 ml     Physical Exam: Vital Signs Blood pressure (!) 150/87, pulse 87, temperature 98.4 F (36.9 C), temperature source Oral, resp. rate 18, height 5\' 6"  (1.676 m), weight 103.5 kg, SpO2 98 %.  Constitutional:  Obese, barrel chested man sitting up in WC, NAD HENT:  Head: Atraumatic.  Mouth/Throat: Oropharynx is clear  Eyes:  EOM are normal.  Neck:  Incision on anterior neck-, but appears to be glued- a lot of bruising and ecchymosis which is resolving Cardiovascular: rate in 60s; in A Fib Respiratory: CTA B/L- no W/R/R even when laying down GI: soft, NT, ND, (+)BS  Musculoskeletal:  Comments: RUE bicep 4+/5, WE 4+/5, triceps 4+/5, grip 4+/5, and finger abd 4+/5 LUE- biceps 3+/5, WE 3+/5, triceps 3/5, grip 3/5, finger abd 3-/5 RLE- HF 5-/5, KE 4+/5, DF 5-/5, PF 5-/5, EHL 4+/5 LLE- HF 4-/5, KE 4/5, DF 4-/5, PF 4/5, EHL 4-/5  Neurological: . Oriented x3. Hoffman's B/L in UEs- no clonus B/L Decreased sensation to light touch L C5-T1 otherwise intact to light touch in all 3 other extremities and torso B/L Skin:  Surgical site neck hematoma resolving.  Noted ecchymosis at site. Psychiatric:  Brighter affect; much less anxiety   Assessment/Plan: 1. 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  Physiatrist is providing close team supervision and 24 hour management of active medical problems listed below.  Physiatrist and rehab team continue to assess barriers to discharge/monitor patient progress toward functional and medical goals  Care Tool:  Bathing    Body parts bathed by patient: Left arm, Chest, Abdomen, Front perineal area, Right upper leg, Left upper leg, Face   Body parts bathed by helper: Buttocks, Right arm, Left arm, Face, Right lower leg, Left lower leg     Bathing assist Assist Level: Moderate Assistance - Patient 50 - 74%     Upper Body Dressing/Undressing Upper body dressing   What is the patient wearing?: Pull over shirt    Upper body assist Assist Level: Maximal Assistance - Patient 25 - 49%    Lower Body Dressing/Undressing Lower body dressing      What is the patient wearing?: Pants     Lower body assist Assist for lower body dressing: Moderate Assistance - Patient 50 - 74%     Toileting Toileting    Toileting assist Assist for toileting: Contact Guard/Touching assist     Transfers  Chair/bed transfer  Transfers assist     Chair/bed transfer assist level: Contact Guard/Touching assist Chair/bed transfer assistive device: Armrests, Programmer, multimedia   Ambulation assist      Assist level: Contact Guard/Touching assist Assistive device: Walker-rolling Max distance: 140'   Walk 10 feet activity   Assist     Assist level: Contact Guard/Touching assist Assistive device: Walker-rolling   Walk 50 feet activity   Assist    Assist level: Contact Guard/Touching assist Assistive device: Walker-rolling    Walk 150 feet activity   Assist Walk 150  feet activity did not occur: Safety/medical concerns         Walk 10 feet on uneven surface  activity   Assist Walk 10 feet on uneven surfaces activity did not occur: Safety/medical concerns         Wheelchair     Assist Will patient use wheelchair at discharge?: No             Wheelchair 50 feet with 2 turns activity    Assist            Wheelchair 150 feet activity     Assist          Blood pressure (!) 150/87, pulse 87, temperature 98.4 F (36.9 C), temperature source Oral, resp. rate 18, height 5\' 6"  (1.676 m), weight 103.5 kg, SpO2 98 %. Problem List and Plan:  1. 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 Soft Cervical collar when OOB  -patient may shower if cover cervical incision  -ELOS/Goals: 16-20 days/Goals Supervision- CGA  2. Antithrombotics:  -DVT/anticoagulation: Resume Eliquis 5 mg twice daily as prior to admission; off heparin gtt  -antiplatelet therapy: [name]  3. Pain Management: Flexeril and oxycodone as needed  4. Mood: Zoloft 50 mg daily, Klonopin 0.25 mg twice daily- will check if was changed from dose at home;   12/31- changed to Ativan QID 0.5 mg- home dose -antipsychotic agents: [name]  5. Neuropsych: This patient is capable of making decisions on his own behalf.  6. Skin/Wound Care: Routine skin checks  7. Fluids/Electrolytes/Nutrition: Routine in and outs with follow-up chemistries  8. History of atrial fibrillation. Patient transitioned from intravenous heparin back to chronic Eliquis. Cardiac rate controlled. Continue Cardizem 100 mg daily, Lopressor 12.5 mg twice daily   1/2- rate on low side this AM- will check EKG after d/w Cardiology. Will determine plan once done.  1/3- EKG looks good- will con't regimen 9. Hypertension. Aldactone 25  mg daily, hydralazine 75 mg 3 times daily. Monitor with increased mobility   1/4: labile, continue to monitor 10. History of CAD with CABG 2004. No complaints of increasing chest pain.  11. Leukocytosis. Felt to be steroid induced. Prednisone taper completed. Follow-up CBC   12/30- down to 21.7 form 28k  1/2- WBC down to 16.1k- doing great 12. Diastolic congestive heart failure. Monitor for any signs of fluid overload   12/30- no weights yet  1/1- didn't have daily weights- changed order   Filed Weights   10/07/19 0403 10/08/19 0521 10/09/19 0302  Weight: 102.2 kg 102 kg 103.5 kg   1/3- weight stable  13. OSA. Currently on BiPAP.   12/30- back on BiPAP last night- will ask reps therapy to address loose fitting mask.  1/1- doing BiPAP setting here- will speak with reps therapy plan.  1/4: will ask  RT to evaluate machine as he wakes with feeling of SOB at night. If machine is working well, symptoms could be related to anxiety more than respiratory etiology and may benefit from prn Ativan at night.  14. Hyperlipidemia. Lipitor  15. BPH and neurogenic bladder. Flomax 0.8 mg daily. Check PVRs for 3 days and cath if necessary  16. Constipation/Neurogenic bowel. Laxative assistance; will clean out and see if has control of stool and if not, will put on bowel program.   12/31- will order Mg citrate and then 3-4 hours later, an enema to clear him out. KUB wasn't real helpful, but had more stool when assessed it myself than discussed.  1/1- cleaned out- 2 very large BMs- feeling better 17. GERD. Protonix  18. Anxiety- Will make sure on home doses of Benzo.  12/31- changed to home dose Ativan QID 0.5 mg 19. Respiratory insufficiency  12/30- on 5-6L high flow O2- will wean as tolerated  - will order continuous pulse ox for now  1/1- off O2 this AM while in gym.  1/2- was off for PT this AM- asking to go back on this AM- will get EKG per Cards 20. Azotemia  12/30- BUN is 32 down from 40- will  monitor; Cr 1.03 down from 1.32  1/1- Labs in AM 21. Likely AD episode today  12/31- paradoxical breathing, SOB, nasal congestion, anxiety- didn't c/o HA- likely due to constipation    LOS: 6 days A FACE TO FACE EVALUATION WAS PERFORMED  Raunel Dimartino P Jondavid Schreier 10/09/2019, 9:30 AM

## 2019-10-09 NOTE — Progress Notes (Signed)
RT rounding and inspected machine. Settings adjusted according to order. Pt preferring to sleep in recliner tonight

## 2019-10-09 NOTE — Consult Note (Signed)
Neuropsychological Consultation   Patient:   [name] [name]   DOB:   1950-11-18  MR Number:  [id number]  Location:  [facility] [id number] [place] [address] Dept: [facility]: [phone]           Date of Service:   10/09/2019  Start Time:   10 AM End Time:   11 AM  Provider/Observer:  Ilean Skill, Psy.D.       Clinical Neuropsychologist       Billing Code/Service: [phone] 4 Units  Chief Complaint:    [name] [name] is a 69 year old male with history of CAD/CABG 2004, lumbar laminectomy, left TKA, hypertension, CHF, anxiety, Afib, sleep apnea.  Presented Dec 17/2020 with severe neck pain and quadriparesis as well as gait instability.  Large herniated disc at C4-5 with severe spinal stenosis.  Underwent anterior cervical discectomy decompression.  Issues with SOB, respiratory distress etc. Post surgery.  Continued concerns about cardiac / pulmonary functioning with patient waking up at night in panic fearing he is not breathing.      Reason for Service:  Patient referred for neuropsychological consultation due to coping and adjustment issues.   Below is the HPI for the current admission.    HPI: [name] [name] is a 69 year old right-handed male with history of CAD status post CABG 2004, lumbar laminectomy, left TKA 2018, hypertension, diastolic congestive heart failure, anxiety, atrial fibrillation with ablation August 2019 and maintained on Eliquis, sleep apnea with CPAP. Per chart review patient lives with spouse. 1 level home one-step to entry. Presented September 21, 2019 with severe neck pain and quadriparesis as well as gait instability. X-rays and imaging demonstrated a large herniated disc at C4-5 with severe spinal stenosis, cervical myelopathy and spinal cord signal change. He had moderate stenosis at C3-4 and 5-6. Underwent C3-4, 4-5 and C5-6 anterior cervical discectomy  decompression, C3-4 4-55-6 interbody arthrodesis with insertion of interbody prosthesis and anterior cervical plating from C3-C6 with globus titanium plate September 20, 9677 per Dr. Arnoldo Morale. Cervical brace as directed. Noted on September 23, 2019 rapid response contacted for respiratory distress requiring NRB 15 L saturations 95%. There was some reported swelling at the surgical site suspect hematoma. Chest x-ray showed mild pulmonary vascular congestion with possible small right pleural effusion. He was transferred to ICU follow-up per critical care medicine. Patient did improve with the use of CPAP however after taking off CPAP the following morning again with increasing shortness of breath received albuterol and Decadron as advised and changed to BiPAP. Sniff test 10/02/2019 showed no diaphragmatic paralysis. Patient also remained on intravenous Lopressor as well as Lasix with noted history of hypertension atrial fibrillation and intravenous heparin was initiated and plan is to resume Eliquis as prior to admission. Patient did not require intubation and improved with ongoing respiratory treatment. Lower extremity Dopplers completed negative for DVT. Noted leukocytosis 28,700 felt to be secondary to steroids which have been tapered to off. Surgical site follow-up per neurosurgery did not appear to show any signs of infection and follow-up imaging demonstrates prevertebral swelling largely resolved. Mild AKI with creatinine 1.45 and Lasix has been discontinued. Patient is tolerating a regular diet. Therapy evaluations completed and patient was admitted for a comprehensive rehab program.   Current Status:  Patient with increase in anxiety and fear around cardiac and pulmonary function and waking at night in almost night terror state fearing that he is choking  and not breathing.  Is using CPAP.  Patient and wife very worried about what his happing with cardiac and breathing functions.    Behavioral  Observation: [name] [name]  presents as a 69 y.o.-year-old Right Caucasian Male who appeared his stated age. his dress was Appropriate and he was Well Groomed and his manners were Appropriate to the situation.  his participation was indicative of Appropriate and Redirectable behaviors.  There were any physical disabilities noted.  he displayed an appropriate level of cooperation and motivation.     Interactions:    Active Appropriate and Redirectable  Attention:   abnormal and attention span appeared shorter than expected for age  Memory:   within normal limits; recent and remote memory intact  Visuo-spatial:  not examined  Speech (Volume):  normal  Speech:   normal; normal  Thought Process:  Coherent and Relevant  Though Content:  WNL; not suicidal and not homicidal  Orientation:   person, place, time/date and situation  Judgment:   Good  Planning:   Fair  Affect:    Anxious  Mood:    Anxious  Insight:   Good  Intelligence:   normal  Medical History:   Past Medical History:  Diagnosis Date  . Anxiety   . CHF (congestive heart failure) (HCC)    h/o due to fluid overload in recovery room  . Coronary artery disease    2v CABG, 2004 [facility])  . COVID-19   . DJD (degenerative joint disease)   . Dyspnea    on exertion  . GERD (gastroesophageal reflux disease)   . History of kidney stones    3 stones yrs. ago  . HLD (hyperlipidemia)   . Hypertension   . MI, old 2004  . OA (osteoarthritis)   . Obesity   . Persistent atrial fibrillation (HCC)    failed medical therapy with sotalol, s/p PVI x 2  . Sleep apnea    on CPAP  . Spinal stenosis   . Typical atrial flutter [facility])    Psychiatric History:  Patient with history of anxiety.  Family Med/Psych History:  Family History  Problem Relation Age of Onset  . Breast cancer Mother   . Hypertension Mother   . Coronary artery disease Father   . Stroke Father   . Hypertension Father     Impression/DX:  [name] R.  Deschene is a 69 year old male with history of CAD/CABG 2004, lumbar laminectomy, left TKA, hypertension, CHF, anxiety, Afib, sleep apnea.  Presented Dec 17/2020 with severe neck pain and quadriparesis as well as gait instability.  Large herniated disc at C4-5 with severe spinal stenosis.  Underwent anterior cervical discectomy decompression.  Issues with SOB, respiratory distress etc. Post surgery.  Continued concerns about cardiac / pulmonary functioning with patient waking up at night in panic fearing he is not breathing.   Patient with increase in anxiety and fear around cardiac and pulmonary function and waking at night in almost night terror state fearing that he is choking and not breathing.  Is using CPAP.  Patient and wife very worried about what his happing with cardiac and breathing functions.   Disposition/Plan:  Worked with patient around anxiety/fear and review issues with patient and wife.  Will follow-up with patient.  Diagnosis:    Constipation - Plan: DG Abd 1 View, DG Abd 1 View         Electronically Signed   _______________________ Arley Phenix, Psy.D.

## 2019-10-09 NOTE — Progress Notes (Signed)
Physical Therapy Session Note  Patient Details  Name: [name] [name] MRN: [id number] Date of Birth: [birth year]  Today's Date: 10/09/2019 PT Individual Time: 1100-1153; 1415-1430 PT Individual Time Calculation (min): 53 min and 15 min PT Missed Time: 60 min Missed Time Reason: patient fatigue  Short Term Goals: Week 1:  PT Short Term Goal 1 (Week 1): Pt will ambulate x 100 ft with LRAD and min A PT Short Term Goal 2 (Week 1): Pt will navigate 4 stairs with 1 handrail and min A PT Short Term Goal 3 (Week 1): Pt will perform bed mobility with Supervision  Skilled Therapeutic Interventions/Progress Updates:    Session 1: Pt received seated in recliner in room handed off from NT having just completed toileting. Pt reports feeling very SOA, SpO2 93% on room air, HR 90. Encouraged pt to perform pursed lip breathing techniques. Pt reports trouble sleeping due to waking up feeling SOA and unable to catch his breath, feels like he is "choking" even with use of BiPap machine. Pt and his wife with lots of anxiety this AM with regards to breathing situation. Medical team aware and to address. Pt wants to attempt sleeping in recliner going forwards. Sit to stand with CGA to RW, v/c for safe RW management. Ambulation x 125 ft with RW and close SBA to CGA for balance. Pt is SOA following gait, SpO2 92% and HR 90 on room air. Reviewed pursed lip breathing techniques and dicussed deconditioning/endurance training. Nustep level 3 x 10 min with use of B UE/LE for global endurance training. Ambulation back to patient's room x 125 ft with RW and close SBA. Pt on room air throughout session with SpO2 remaining at 92% (+). Pt left seated in recliner in room with needs in reach, quick release belt and chair alarm in place, wife present.  Session 2: Pt received seated in recliner asleep with BiPap machine on. Per pt's wife pt was significantly fatigued following AM sessions and she had to assist him with eating lunch. Pt  is arousable but declines any participation in therapy this session due to fatigue. Education with patient and his wife with regards to debility, endurance training, etc as well as anxiety-related issues with SOA. Pt is too fatigued to participate in breathing exercises this PM but will attempt next session. RN and medical team aware of ongoing issues with SOA, to discuss with patient and his family. Pt missed 60 min of scheduled therapy session due to fatigue. Pt left seated in recliner in room with needs in reach, BiPap machine on, wife present.  Therapy Documentation Precautions:  Precautions Precautions: Cervical Precaution Booklet Issued: Yes (comment) Precaution Comments: verbal discussion of precautions Required Braces or Orthoses: Cervical Brace Cervical Brace: Hard collar Restrictions Weight Bearing Restrictions: No   Therapy/Group: Individual Therapy   Peter Congo, PT, DPT  10/09/2019, 12:44 PM

## 2019-10-09 NOTE — Progress Notes (Signed)
Occupational Therapy Session Note  Patient Details  Name: [name] [name] MRN: [id number] Date of Birth: Aug 14, 1951  Today's Date: 10/09/2019 OT Individual Time: 3220-2542 OT Individual Time Calculation (min): 55 min    Short Term Goals: Week 1:  OT Short Term Goal 1 (Week 1): Pt will complete toilet transfer wiht CGA OT Short Term Goal 2 (Week 1): Pt will thread head into shirt wiht AE PRN OT Short Term Goal 3 (Week 1): Pt will thread BLE into pants wiht AE PRN OT Short Term Goal 4 (Week 1): Pt will groom in stanidng with CGA for improved endurnace  Skilled Therapeutic Interventions/Progress Updates:    Pt resting in recliner upon arrival and ready for therapy.  Pt declined bathing/dressing this morning stating that his wife would help him with a shower later in the day.  Educated pt on safety and reminded him that only staff could assist with shower.  Reminded pt that bathing/dressing is part of OT POC on rehab.  Pt again stated that his wife would help him wash up at sink. Pt stated that he became very anxious and panicky during the night around 0330. Pt initially on 7L O2 but reduced to 3L during session.  O2 sats>90% throughout therapy session. Sit<>stand and short amb with RW at CGA/min A. O2 sats>90%. Pt focused on anxiety during night. Emotional support provided. Focus on education, sit<>stand, standing balance, and discharge planning. Pt remained seated in recliner with belt alarm activated and all needs within reach.    Therapy Documentation Precautions:  Precautions Precautions: Cervical Precaution Booklet Issued: Yes (comment) Precaution Comments: verbal discussion of precautions Required Braces or Orthoses: Cervical Brace Cervical Brace: Hard collar Restrictions Weight Bearing Restrictions: No Pain:  Pt denies pain this morning   Therapy/Group: Individual Therapy  [name] 10/09/2019, 10:55 AM

## 2019-10-09 NOTE — Progress Notes (Signed)
Pt called for nurse feeling SOB.SATS WNL but anxious appearing. Had been attempting to use his urinal but seems to have become overwhelmed buy the wiring from his CPAP.  Increased back to [id number] and orthopneic positioning  with good response. Moved to recliner per request as he does better sitting up per pt. This is his 3rd round of anxiety tonight

## 2019-10-09 NOTE — Progress Notes (Signed)
RT NOTES: Called to patient's room d/t patient having trouble breathing. Patient on bipap through the dream station with 7lpm o2 bled-in. Sats 98-100%, HR 91, RR 22 and unlabored. BBS clear to auscultation. RN also at bedside. Patient's wife concerned about patient being in rehab, would prefer him having a higher level of care. RN calling MD. Will await further orders.

## 2019-10-10 ENCOUNTER — Inpatient Hospital Stay (HOSPITAL_COMMUNITY): Payer: Medicare Other

## 2019-10-10 ENCOUNTER — Inpatient Hospital Stay (HOSPITAL_COMMUNITY): Payer: Medicare Other | Admitting: Physical Therapy

## 2019-10-10 MED ORDER — FUROSEMIDE 40 MG PO TABS
40.0000 mg | ORAL_TABLET | Freq: Once | ORAL | Status: AC
  Administered 2019-10-10: 10:00:00 40 mg via ORAL
  Filled 2019-10-10: qty 1

## 2019-10-10 NOTE — Progress Notes (Signed)
Physical Therapy Session Note  Patient Details  Name: [name] [name] MRN: [id number] Date of Birth: [birth year]  Today's Date: 10/10/2019 PT Individual Time: 0800-0900 PT Individual Time Calculation (min): 60 min   Short Term Goals: Week 1:  PT Short Term Goal 1 (Week 1): Pt will ambulate x 100 ft with LRAD and min A PT Short Term Goal 2 (Week 1): Pt will navigate 4 stairs with 1 handrail and min A PT Short Term Goal 3 (Week 1): Pt will perform bed mobility with Supervision  Skilled Therapeutic Interventions/Progress Updates:    Pt received seated in recliner in room. Pt reports sleeping much better in recliner last night vs sleeping in the bed. Pt initially on 5L O2 at rest, SpO2 97% and higher. Pt on room air throughout session with SpO2 remaining at 92% and higher with activity. No complaints of pain this AM. Sit to stand with CGA to RW. Pt reports urge to urinate. Toilet transfer with CGA with use of RW, min A with doffing/donning pants. Stand pivot transfer recliner to/from w/c with RW and CGA, max v/c for safe use of RW with transfers. Reviewed pursed lip breathing, diaphragmatic breathing, and controlling breathing during "respiratory distress". Provided demonstration, pt performed return demonstration, and provided handouts to patient. Will continue to reinforce breathing techniques with patient in order to improve shortness of breath with activity as well as anxiety-induced shortness of breath noted at times. Pt left seated in recliner in room with needs in reach at end of session, quick release belt and chair alarm in place. Per discussion with MD pt schedule to be changed to 15/7 due to decreased tolerance for 3 hours of therapy/day.  Therapy Documentation Precautions:  Precautions Precautions: Cervical Precaution Booklet Issued: Yes (comment) Precaution Comments: verbal discussion of precautions Required Braces or Orthoses: Cervical Brace Cervical Brace: Hard  collar Restrictions Weight Bearing Restrictions: No    Therapy/Group: Individual Therapy   Peter Congo, PT, DPT  10/10/2019, 3:21 PM

## 2019-10-10 NOTE — Progress Notes (Signed)
Pt awake after sleeping for a few hours, states feeling much better on adjusted setting, no longer feeling anxious breathing non labored. took a few sips of his drink and returned to sleep on the bipap in the recliner

## 2019-10-10 NOTE — Patient Care Conference (Addendum)
Inpatient RehabilitationTeam Conference and Plan of Care Update Date: 10/10/2019   Time: 11:00 AM    Patient Name: [name] [name]      Medical Record Number: [id number]  Date of Birth: 12-29-1950 Sex: Male         Room/Bed: 4W07C/4W07C-01 Payor Info: Payor: MEDICARE / Plan: MEDICARE PART A AND B / Product Type: *No Product type* /    Admit Date/Time:  10/03/2019  2:38 PM  Primary Diagnosis:  Myelopathy concurrent with and due to spinal stenosis of cervical region [facility])  Patient Active Problem List   Diagnosis Date Noted  . Cervical myelopathy ([place]) 10/03/2019  . Myelopathy concurrent with and due to spinal stenosis of cervical region ([place]) 09/21/2019  . Paroxysmal atrial fibrillation ([place]) 05/24/2018  . Unstable angina ([place]) 02/04/2018  . Typical atrial flutter ([place])   . Spinal stenosis   . Sleep apnea   . QT prolongation   . Palpitations   . PAF (paroxysmal atrial fibrillation) ([place])   . OA (osteoarthritis)   . Hypertension   . HLD (hyperlipidemia)   . Edema   . DJD (degenerative joint disease)   . Coronary artery disease   . Chronic chest pain   . 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 A-fib ([place]) 09/15/2016  . Chest pain 09/09/2016  . Obesity 08/22/2016  . Anxiety state 08/22/2016  . Encounter for monitoring sotalol therapy 08/20/2016  . Morbidly obese ([place]) 07/12/2016  . Medical non-compliance 07/12/2016  . Elevated TSH 07/09/2016  . BPH without urinary obstruction 07/09/2016  . Need for hepatitis C screening test 07/09/2016  . Idiopathic gout 07/09/2016  . Hyperglycemia 08/01/2015  . Atrial fibrillation with RVR ([place]) 03/28/2015  . Routine general medical examination at a health care facility 08/11/2013  . Chronic sinus bradycardia   . Obstructive sleep apnea 09/12/2010  . MYOCARDIAL INFARCTION 09/11/2010  . Spinal stenosis, unspecified region other than cervical 09/11/2010  . Hyperlipidemia with target LDL less than 70 09/08/2010  . Depression  09/08/2010  . Essential hypertension 09/08/2010  . Coronary atherosclerosis 09/08/2010  . Congestive heart failure ([place]) 09/08/2010  . GERD 09/08/2010  . MI, old 10/05/2002    Expected Discharge Date: Expected Discharge Date: 10/17/19  Team Members Present: Physician leading conference: Dr. Courtney Heys Social Worker Present: Lennart Pall, LCSW Nurse Present: Isla Pence, RN Case Manager: Karene Fry, RN PT Present: Excell Seltzer, PT OT Present: Willeen Cass, OT;Roanna Epley, COTA SLP Present: Stormy Fabian, SLP PPS Coordinator present : Gunnar Fusi, Novella Olive, PT     Current Status/Progress Goal Weekly Team Focus  Bowel/Bladder   continent bowel and bladder  continue continence  assess q shift and prn   Swallow/Nutrition/ Hydration             ADL's   functional transfers-CGA; bathing-mod A; dressing-mod A; toileting-min A; increased anxiety and SOB; max verbal cues to attend to task  supervisio overall  activity tolerance, functional transers, BADL retrainining, education   Mobility   CGA transfers with RW, SBA to CGA gait with RW up to 140', min A one step with RW  Supervision overall  mobility and transfers, endurance, breathing techniques   Communication             Safety/Cognition/ Behavioral Observations            Pain   0 c/o pain   pain less than 5  assess q shift and prn   Skin   incison to ant  neck healing, well approximated, stage 1 breakdown to bridge of nose, MASD to buttock  maintain skin integrity  assess q shift and prn    Rehab Goals Patient on target to meet rehab goals: Yes *See Care Plan and progress notes for long and short-term goals.     Barriers to Discharge  Current Status/Progress Possible Resolutions Date Resolved   Nursing                  PT  Medical stability;Other (comments)  anxiety              OT                  SLP                SW                Discharge Planning/Teaching Needs:  Home with wife and  family to provide 24/7 assistance.  Teaching needs TBD   Team Discussion: Anxiety, weight increasing, fluid overload, give lasix today, change to 15/7 therapies.  RN A+O, slept in recliner better, lasix given, on O2, SOB.  PT CGA transfers and gait 120' walker, min A stairs, SOB, room air with therapy.  OT room air, showered, sats 93-95%, transfers CGA, amb CGA, mod/max VCs for safety awareness, wife does ADLs at home, S goals.  Can have Aspen collar off when up in chair.   Revisions to Treatment Plan: [name]     Medical Summary Current Status: sleeping in recliner; appears more puffy- Lasix 40 mg x1 given; doing O2 in room but not with therapy; mod-max cues safety concerns; Weekly Focus/Goal: CGA transfers; min assist stairs; 120 ft RW min assist-CGA; O2 sats in 90s with therapy;  Barriers to Discharge: Behavior;Decreased family/caregiver support;Home enviroment access/layout;Medical stability;Weight;New oxygen;Other (comments)  Barriers to Discharge Comments: fluid overload? Given Lasix Possible Resolutions to Barriers: goals Supervision- anxiety much better today- aspen collar when up   Continued Need for Acute Rehabilitation Level of Care: The patient requires daily medical management by a physician with specialized training in physical medicine and rehabilitation for the following reasons: Direction of a multidisciplinary physical rehabilitation program to maximize functional independence : Yes Medical management of patient stability for increased activity during participation in an intensive rehabilitation regime.: Yes Analysis of laboratory values and/or radiology reports with any subsequent need for medication adjustment and/or medical intervention. : Yes   I attest that I was present, lead the team conference, and concur with the assessment and plan of the team.   Trish Mage 10/10/2019, 9:48 PM   Team conference was held via web/ teleconference due to COVID - 19

## 2019-10-10 NOTE — Progress Notes (Addendum)
[place] PHYSICAL MEDICINE & REHABILITATION PROGRESS NOTE   Subjective/Complaints:   Had a lot of anxiety yesterday; Slept in recliner- had a better night- O2 not hooked up when when to bathroom, but hooked it back when got out of bathroom. LBM yesterday- Says feels a little SOB after ambulating from bathroom to bedside chair, but o2 sats 93% or above on Pulse ox.  Also PT suggested moving his therapy to 15/7- will change order  ROS- pt denied  CP, HA, N/V/D; abd pain/fullness . Denies SOB when up Objective:   No results found. Recent Labs    10/09/19 0520  WBC 14.4*  HGB 13.3  HCT 39.8  PLT 240   Recent Labs    10/09/19 0520  NA 138  K 4.1  CL 101  CO2 26  GLUCOSE 117*  BUN 18  CREATININE 1.16  CALCIUM 9.3    Intake/Output Summary (Last 24 hours) at 10/10/2019 4132 Last data filed at 10/10/2019 0439 Gross per 24 hour  Intake 0 ml  Output 400 ml  Net -400 ml     Physical Exam: Vital Signs Blood pressure 127/68, pulse 87, temperature 97.9 F (36.6 C), temperature source Oral, resp. rate 16, height 5\' 6"  (1.676 m), weight 106.1 kg, SpO2 95 %.  Constitutional:  Obese, barrel chested man sitting on toilet then ambulated to sink then bedside chair with RW and PT in room; NAD HENT:  Head: Atraumatic.  Mouth/Throat: Oropharynx is clear  Eyes:  EOM are normal.  Neck:  Incision on anterior neck-, but appears to be glued- a lot of bruising and ecchymosis which is resolving Cardiovascular:irregular rhythm, rate in 90s-100s with walking Respiratory: CTA B/L- no W/R/R great air movement B/L- using more accessory muscles after walking GI: soft, NT, ND, (+)BS  Musculoskeletal:  Comments: RUE bicep 4+/5, WE 4+/5, triceps 4+/5, grip 4+/5, and finger abd 4+/5 LUE- biceps 3+/5, WE 3+/5, triceps 3/5, grip 3/5, finger abd 3-/5 RLE- HF 5-/5, KE 4+/5, DF 5-/5, PF 5-/5, EHL 4+/5 LLE- HF 4-/5, KE 4/5, DF 4-/5, PF 4/5, EHL 4-/5  Neurological: . Oriented x3. Hoffman's B/L in  UEs- no clonus B/L Decreased sensation to light touch L C5-T1 otherwise intact to light touch in all 3 other extremities and torso B/L Skin:  Surgical site neck hematoma resolving. Noted ecchymosis at site. Psychiatric:  Brighter affect; much less anxiety   Assessment/Plan: 1. 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  Physiatrist is providing close team supervision and 24 hour management of active medical problems listed below.  Physiatrist and rehab team continue to assess barriers to discharge/monitor patient progress toward functional and medical goals  Care Tool:  Bathing    Body parts bathed by patient: Left arm, Chest, Abdomen, Front perineal area, Right upper leg, Left upper leg, Face   Body parts bathed by helper: Buttocks, Right arm, Left arm, Face, Right lower leg, Left lower leg     Bathing assist Assist Level: Moderate Assistance - Patient 50 - 74%     Upper Body Dressing/Undressing Upper body dressing   What is the patient wearing?: Pull over shirt    Upper body assist Assist Level: Maximal Assistance - Patient 25 - 49%    Lower Body Dressing/Undressing Lower body dressing      What is the patient wearing?: Pants     Lower body assist Assist for lower body dressing: Moderate Assistance -  Patient 50 - 74%     Toileting Toileting    Toileting assist Assist for toileting: Contact Guard/Touching assist     Transfers Chair/bed transfer  Transfers assist     Chair/bed transfer assist level: Contact Guard/Touching assist Chair/bed transfer assistive device: Walker, Clinical biochemist   Ambulation assist      Assist level: Contact Guard/Touching assist Assistive device: Walker-rolling Max distance: 125'   Walk 10 feet activity   Assist     Assist level: Contact Guard/Touching assist Assistive device:  Walker-rolling   Walk 50 feet activity   Assist    Assist level: Contact Guard/Touching assist Assistive device: Walker-rolling    Walk 150 feet activity   Assist Walk 150 feet activity did not occur: Safety/medical concerns         Walk 10 feet on uneven surface  activity   Assist Walk 10 feet on uneven surfaces activity did not occur: Safety/medical concerns         Wheelchair     Assist Will patient use wheelchair at discharge?: No             Wheelchair 50 feet with 2 turns activity    Assist            Wheelchair 150 feet activity     Assist          Blood pressure 127/68, pulse 87, temperature 97.9 F (36.6 C), temperature source Oral, resp. rate 16, height 5\' 6"  (1.676 m), weight 106.1 kg, SpO2 95 %. Problem List and Plan:  1. 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 Soft Cervical collar when OOB  -patient may shower if cover cervical incision  -ELOS/Goals: 16-20 days/Goals Supervision- CGA   1/5- will change to 15/7 2. Antithrombotics:  -DVT/anticoagulation: Resume Eliquis 5 mg twice daily as prior to admission; off heparin gtt  -antiplatelet therapy: [name]  3. Pain Management: Flexeril and oxycodone as needed  4. Mood: Zoloft 50 mg daily, Klonopin 0.25 mg twice daily- will check if was changed from dose at home;   12/31- changed to Ativan QID 0.5 mg- home dose -antipsychotic agents: [name]  5. Neuropsych: This patient is capable of making decisions on his own behalf.  6. Skin/Wound Care: Routine skin checks  7. Fluids/Electrolytes/Nutrition: Routine in and outs with follow-up chemistries  8. History of atrial fibrillation. Patient transitioned from intravenous heparin back to chronic Eliquis. Cardiac rate controlled. Continue Cardizem 100 mg  daily, Lopressor 12.5 mg twice daily   1/2- rate on low side this AM- will check EKG after d/w Cardiology. Will determine plan once done.  1/3- EKG looks good- will con't regimen 9. Hypertension. Aldactone 25 mg daily, hydralazine 75 mg 3 times daily. Monitor with increased mobility   1/5: labile, continue to monitor- is chronically labile per pt. 10. History of CAD with CABG 2004. No complaints of increasing chest pain.  11. Leukocytosis. Felt to be steroid induced. Prednisone taper completed. Follow-up CBC   12/30- down to 21.7 form 28k  1/2- WBC down to 16.1k- doing great  1/4- WBC down to 14.1k 12. Diastolic congestive heart failure. Monitor for any signs of fluid overload   12/30- no weights yet  1/1- didn't have daily weights- changed order   Filed Weights   10/08/19 0521 10/09/19 0302 10/10/19 0439  Weight: 102 kg 103.5 kg 106.1 kg  1/3- weight stable  1/5- Weight going up - up 4 kg since this past Friday- pt was on Lasix 20 mg prn at home- feels SOB when lays down- c/w fluid overload although Resp exam wasn't- will give Lasix 40 mg x1- know has QT prolongation, but was on at home and tolerated.  13. OSA. Currently on BiPAP.   12/30- back on BiPAP last night- will ask reps therapy to address loose fitting mask.  1/1- doing BiPAP setting here- will speak with reps therapy plan.  1/4: will ask RT to evaluate machine as he wakes with feeling of SOB at night. If machine is working well, symptoms could be related to anxiety more than respiratory etiology and may benefit from prn Ativan at night.  14. Hyperlipidemia. Lipitor  15. BPH and neurogenic bladder. Flomax 0.8 mg daily. Check PVRs for 3 days and cath if necessary  16. Constipation/Neurogenic bowel. Laxative assistance; will clean out and see if has control of stool and if not, will put on bowel program.   12/31- will order Mg citrate and then 3-4 hours later, an enema to clear him out. KUB wasn't real helpful, but had more stool  when assessed it myself than discussed.  1/1- cleaned out- 2 very large BMs- feeling better 17. GERD. Protonix  18. Anxiety- Will make sure on home doses of Benzo.  12/31- changed to home dose Ativan QID 0.5 mg 19. Respiratory insufficiency  12/30- on 5-6L high flow O2- will wean as tolerated  - will order continuous pulse ox for now  1/1- off O2 this AM while in gym.  1/2- was off for PT this AM- asking to go back on this AM- will get EKG per Cards  1/4- EKG looked good-  20. Azotemia  12/30- BUN is 32 down from 40- will monitor; Cr 1.03 down from 1.32  1/5- Cr 1.16 and BUN 18- doing better  1/1- Labs in AM 21. Likely AD episode today  12/31- paradoxical breathing, SOB, nasal congestion, anxiety- didn't c/o HA- likely due to constipation -resolved episode LOS: 7 days A FACE TO FACE EVALUATION WAS PERFORMED  Nancyjo Givhan 10/10/2019, 9:07 AM

## 2019-10-10 NOTE — Progress Notes (Signed)
Occupational Therapy Session Note  Patient Details  Name: [name] [name] MRN: [id number] Date of Birth: 07-26-1951  Today's Date: 10/10/2019 OT Individual Time: 1000-1055 OT Individual Time Calculation (min): 55 min    Short Term Goals: Week 1:  OT Short Term Goal 1 (Week 1): Pt will complete toilet transfer wiht CGA OT Short Term Goal 2 (Week 1): Pt will thread head into shirt wiht AE PRN OT Short Term Goal 3 (Week 1): Pt will thread BLE into pants wiht AE PRN OT Short Term Goal 4 (Week 1): Pt will groom in stanidng with CGA for improved endurnace  Skilled Therapeutic Interventions/Progress Updates:    Pt resting in recliner upon arrival with wife present.  Pt agreeable to shower this morning.  Pt requested to brush teeth before shower.  Pt continues to attempt to stand without RW and requires max verbal cues for safety awareness with all mobility. Pt stood at sink for brushing teeth.  Pt requested a break before walking into shower.  Pt amb with RW to toilet before transferring to shower-CGA. Pt's wife assisted with bathing/dressing tasks, stating "this isn't my first rodeo" and also confirming that she will be assisting pt after discharge. Pt performed sit<>stand X 4 in shower with supervision using grab bars. Pt's wife assisted with dressing tasks. Pt O2 sats>90% on RA throughout session. Pt requires multiple rest breaks during bathing/dressing and exhinbits SOB but not drop in O2 sats. Pt remained seated in recliner with belt alarm activated and wife present.  All needs within reach.   Therapy Documentation Precautions:  Precautions Precautions: Cervical Precaution Booklet Issued: Yes (comment) Precaution Comments: verbal discussion of precautions Required Braces or Orthoses: Cervical Brace Cervical Brace: Hard collar Restrictions Weight Bearing Restrictions: No General:   Vital Signs:   Pain: Pt denies pain this morning   Therapy/Group: Individual Therapy  [name] 10/10/2019, 11:01 AM

## 2019-10-10 NOTE — Progress Notes (Signed)
RT NOTES: Patient requesting to go on bipap to rest. Placed patient on bipap via dream station on auto titrate mode with 5lpm oxygen. Will continue to monitor.

## 2019-10-10 NOTE — Progress Notes (Signed)
Physical Therapy Session Note  Patient Details  Name: SYLAS TWOMBLY MRN: [id number] Date of Birth: August 04, 1951  Today's Date: 10/10/2019 PT Individual Time: 9326-7124 PT Individual Time Calculation (min): 72 min   Short Term Goals: Week 1:  PT Short Term Goal 1 (Week 1): Pt will ambulate x 100 ft with LRAD and min A PT Short Term Goal 2 (Week 1): Pt will navigate 4 stairs with 1 handrail and min A PT Short Term Goal 3 (Week 1): Pt will perform bed mobility with Supervision  Skilled Therapeutic Interventions/Progress Updates:    Pt seated in recliner upon PT arrival, pt reports "I am tired, I want to just take a nap." Therapist providing encouragement for participation this afternoon and education on importance of exercise/activity. Pt agreeable. Pt sat up in recliner, sit<>stand with RW and CGA. Pt reports having to use the bathroom, ambulates within the room using RW and CGA to bathroom, continent of bladder and performs clothing management without assist. Pt standing at sink with supervision for standing balance while washing hands. Pt transported to the gym in w/c. Stand pivot to nustep with RW and CGA, pt used nustep this session working on cardiovascular endurance and global strengthening on workload 5, x 10 minutes - SpO2 95% follow activity while on 2L/min O2. Pt worked on gait and endurance this session in order to ambulate the following distances with RW and CGA- x60 ft on 2L O2/min with SpO2 96%, x 50 ft on 1L O2/min with SpO2 and x 50 ft on RA with SpO2 92 % - during gait cues for upright posture and breathing techniques. Pt maintained on RA for the remainder of the session with SpO2 remaining >92% while performing the following tasks- toe taps on aerobic step for balance and endurance x 2 trials, and then x10 sit<>stands for strengthening. Pt transported back to room and transferred to recliner, left seated in recliner with needs in reach and chair alarm set. Wife present in the room,  discussed session with pt's wife.   Therapy Documentation Precautions:  Precautions Precautions: Cervical Precaution Booklet Issued: Yes (comment) Precaution Comments: verbal discussion of precautions Required Braces or Orthoses: Cervical Brace Cervical Brace: Hard collar Restrictions Weight Bearing Restrictions: No    Therapy/Group: Individual Therapy  Cresenciano Genre, PT, DPT, CSRS 10/10/2019, 12:41 PM

## 2019-10-10 NOTE — Progress Notes (Addendum)
Pt alseep in recliner with labored breathing. Pt placed on bipap with 7LO2. SAT 92%. Pt called back out that bipap disconnected, unable to get mask to be taut, removed, SAT dropped to 87% pt agreed to wear O2 until after dinner. SAT returned to 93% on 2L via [place]

## 2019-10-10 NOTE — Progress Notes (Signed)
Pt slept very well on revised bipap setting. Able to ambulate to BR this am with minor SOB, maintained sats in 90s. No s/s of distress.

## 2019-10-10 NOTE — Progress Notes (Signed)
RT NOTES: Called to room by RN stating patient needs to go on his bipap. Entered patient's room and patient states he doesn't want to go on his bipap at this time. Pt's breathing is slightly labored on nasal cannula, sats 96%.

## 2019-10-10 NOTE — Progress Notes (Signed)
Nurse called to room, wife at bedside, pt was in room without oxygen, pt SAT was 82% at rest.  Oxygen placed back on pt at 2L via [place] and returned to 93%.

## 2019-10-11 ENCOUNTER — Inpatient Hospital Stay (HOSPITAL_COMMUNITY): Payer: Medicare Other | Admitting: Physical Therapy

## 2019-10-11 ENCOUNTER — Inpatient Hospital Stay (HOSPITAL_COMMUNITY): Payer: Medicare Other

## 2019-10-11 ENCOUNTER — Ambulatory Visit: Payer: Medicare Other | Admitting: Cardiology

## 2019-10-11 NOTE — Progress Notes (Signed)
Occupational Therapy Weekly Progress Note  Patient Details  Name: [name] [name] MRN: [id number] Date of Birth: 1950-12-08  Beginning of progress report period: October 04, 2019 End of progress report period: October 11, 2019  Patient has met 4 of 4 short term goals.  Pt progress has been slow but steady during the past week.  Pt requires CGA for functional amb with RW and all functional transfers.  Pt requires max verbal cues for safety awareness and encouragement during BADLs.  Pt's wife has been present and assisted pt with bathing/dressing tasks.  Pt's wife has tendency to assist pt more than necessary and pt is receptive to additional assistance.  Anticipate pt's wife will continue to provide increased assistance after discharge. Pt experiences increased SOB with activity and requires multiple rest breaks.  Pt O2 sats>90% on RA during BADLs.   Patient continues to demonstrate the following deficits: muscle weakness, decreased cardiorespiratoy endurance, unbalanced muscle activation, decreased coordination and decreased motor planning, decreased safety awareness and decreased sitting balance, decreased standing balance, decreased postural control, decreased balance strategies and difficulty maintaining precautions and therefore will continue to benefit from skilled OT intervention to enhance overall performance with BADL and iADL.  Patient progressing toward long term goals..  Continue plan of care.  OT Short Term Goals Week 1:  OT Short Term Goal 1 (Week 1): Pt will complete toilet transfer wiht CGA OT Short Term Goal 1 - Progress (Week 1): Met OT Short Term Goal 2 (Week 1): Pt will thread head into shirt wiht AE PRN OT Short Term Goal 2 - Progress (Week 1): Met OT Short Term Goal 3 (Week 1): Pt will thread BLE into pants wiht AE PRN OT Short Term Goal 3 - Progress (Week 1): Met OT Short Term Goal 4 (Week 1): Pt will groom in stanidng with CGA for improved endurnace OT Short Term Goal 4 -  Progress (Week 1): Met Week 2:  OT Short Term Goal 1 (Week 2): STG=LTG secondary to ELOS   Leroy Libman 10/11/2019, 6:46 AM

## 2019-10-11 NOTE — Progress Notes (Signed)
[facility] PHYSICAL MEDICINE & REHABILITATION PROGRESS NOTE   Subjective/Complaints:   Pt reports just finished OT- a little SOB, but overall a little better since had Lasix 40 mg x1 yesterday- voided a LOT with Lasix!  Slept well-    ROS- pt denied  CP, HA, N/V/D; abd pain/fullness . Denies SOB when up; only when laying down Objective:   No results found. Recent Labs    10/09/19 0520  WBC 14.4*  HGB 13.3  HCT 39.8  PLT 240   Recent Labs    10/09/19 0520  NA 138  K 4.1  CL 101  CO2 26  GLUCOSE 117*  BUN 18  CREATININE 1.16  CALCIUM 9.3    Intake/Output Summary (Last 24 hours) at 10/11/2019 0852 Last data filed at 10/10/2019 1839 Gross per 24 hour  Intake 280 ml  Output --  Net 280 ml     Physical Exam: Vital Signs Blood pressure 111/62, pulse (!) 57, temperature 98.1 F (36.7 C), temperature source Oral, resp. rate 16, height 5\' 6"  (1.676 m), weight 106.1 kg, SpO2 96 %.  Constitutional:  Obese, barrel chested man sitting in bedside chair; bright cheeks; off O2; ; NAD HENT:  Head: Atraumatic.  Mouth/Throat: Oropharynx is clear  Eyes:  EOM are normal.  Neck:  Incision on anterior neck-, but appears to be glued- a lot of bruising and ecchymosis which is resolving Cardiovascular:irregular rhythm, rate in 90s-100s with walking Respiratory: CTA B/L- no W/R/R great air movement B/L- using more accessory muscles after walking GI: soft, NT, ND, (+)BS  Musculoskeletal:  Comments: RUE bicep 4+/5, WE 4+/5, triceps 4+/5, grip 4+/5, and finger abd 4+/5 LUE- biceps 3+/5, WE 3+/5, triceps 3/5, grip 3/5, finger abd 3-/5 RLE- HF 5-/5, KE 4+/5, DF 5-/5, PF 5-/5, EHL 4+/5 LLE- HF 4-/5, KE 4/5, DF 4-/5, PF 4/5, EHL 4-/5  Neurological: . Oriented x3. Hoffman's B/L in UEs- no clonus B/L Decreased sensation to light touch L C5-T1 otherwise intact to light touch in all 3 other extremities and torso B/L Skin:  Surgical site neck hematoma resolving. Noted ecchymosis at  site. Psychiatric:  Brighter affect; much less anxiety   Assessment/Plan: 1. 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  Physiatrist is providing close team supervision and 24 hour management of active medical problems listed below.  Physiatrist and rehab team continue to assess barriers to discharge/monitor patient progress toward functional and medical goals  Care Tool:  Bathing    Body parts bathed by patient: Left arm, Chest, Abdomen, Front perineal area, Right upper leg, Left upper leg, Face   Body parts bathed by helper: Buttocks, Right arm, Left arm, Face, Right lower leg, Left lower leg     Bathing assist Assist Level: Moderate Assistance - Patient 50 - 74%     Upper Body Dressing/Undressing Upper body dressing   What is the patient wearing?: Pull over shirt    Upper body assist Assist Level: Maximal Assistance - Patient 25 - 49%    Lower Body Dressing/Undressing Lower body dressing      What is the patient wearing?: Pants     Lower body assist Assist for lower body dressing: Moderate Assistance - Patient 50 - 74%     Toileting Toileting    Toileting assist Assist for toileting: Contact Guard/Touching assist     Transfers Chair/bed transfer  Transfers assist     Chair/bed transfer assist  level: Contact Guard/Touching assist Chair/bed transfer assistive device: Armrests, Museum/gallery exhibitions officer assist      Assist level: Contact Guard/Touching assist Assistive device: Walker-rolling Max distance: 60 ft   Walk 10 feet activity   Assist     Assist level: Contact Guard/Touching assist Assistive device: Walker-rolling   Walk 50 feet activity   Assist    Assist level: Contact Guard/Touching assist Assistive device: Walker-rolling    Walk 150 feet activity   Assist Walk 150 feet activity did  not occur: Safety/medical concerns         Walk 10 feet on uneven surface  activity   Assist Walk 10 feet on uneven surfaces activity did not occur: Safety/medical concerns         Wheelchair     Assist Will patient use wheelchair at discharge?: No             Wheelchair 50 feet with 2 turns activity    Assist            Wheelchair 150 feet activity     Assist          Blood pressure 111/62, pulse (!) 57, temperature 98.1 F (36.7 C), temperature source Oral, resp. rate 16, height 5\' 6"  (1.676 m), weight 106.1 kg, SpO2 96 %. Problem List and Plan:  1. 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 Soft Cervical collar when OOB  -patient may shower if cover cervical incision  -ELOS/Goals: 16-20 days/Goals Supervision- CGA   1/5- will change to 15/7 2. Antithrombotics:  -DVT/anticoagulation: Resume Eliquis 5 mg twice daily as prior to admission; off heparin gtt  -antiplatelet therapy: [name]  3. Pain Management: Flexeril and oxycodone as needed  4. Mood: Zoloft 50 mg daily, Klonopin 0.25 mg twice daily- will check if was changed from dose at home;   12/31- changed to Ativan QID 0.5 mg- home dose -antipsychotic agents: [name]  5. Neuropsych: This patient is capable of making decisions on his own behalf.  6. Skin/Wound Care: Routine skin checks  7. Fluids/Electrolytes/Nutrition: Routine in and outs with follow-up chemistries  8. History of atrial fibrillation. Patient transitioned from intravenous heparin back to chronic Eliquis. Cardiac rate controlled. Continue Cardizem 100 mg daily, Lopressor 12.5 mg twice daily   1/2- rate on low side this AM- will check EKG after d/w Cardiology. Will determine plan once done.  1/3- EKG looks good- will con't regimen 9. Hypertension.  Aldactone 25 mg daily, hydralazine 75 mg 3 times daily. Monitor with increased mobility   1/5: labile, continue to monitor- is chronically labile per pt. 10. History of CAD with CABG 2004. No complaints of increasing chest pain.  11. Leukocytosis. Felt to be steroid induced. Prednisone taper completed. Follow-up CBC   12/30- down to 21.7 form 28k  1/2- WBC down to 16.1k- doing great  1/4- WBC down to 14.1k 12. Diastolic congestive heart failure. Monitor for any signs of fluid overload   12/30- no weights yet  1/1- didn't have daily weights- changed order   Filed Weights   10/08/19 0521 10/09/19 0302 10/10/19 0439  Weight: 102 kg 103.5 kg 106.1 kg   1/3- weight stable  1/5- Weight going up - up 4 kg since this past Friday- pt was on Lasix 20 mg prn at home- feels SOB when lays down- c/w fluid overload although Resp exam  wasn't- will give Lasix 40 mg x1- know has QT prolongation, but was on at home and tolerated.  1/6- no weight for today- but voided a lot yesterday  13. OSA. Currently on BiPAP.   12/30- back on BiPAP last night- will ask reps therapy to address loose fitting mask.  1/1- doing BiPAP setting here- will speak with reps therapy plan.  1/4: will ask RT to evaluate machine as he wakes with feeling of SOB at night. If machine is working well, symptoms could be related to anxiety more than respiratory etiology and may benefit from prn Ativan at night.  14. Hyperlipidemia. Lipitor  15. BPH and neurogenic bladder. Flomax 0.8 mg daily. Check PVRs for 3 days and cath if necessary  16. Constipation/Neurogenic bowel. Laxative assistance; will clean out and see if has control of stool and if not, will put on bowel program.   12/31- will order Mg citrate and then 3-4 hours later, an enema to clear him out. KUB wasn't real helpful, but had more stool when assessed it myself than discussed.  1/1- cleaned out- 2 very large BMs- feeling better 17. GERD. Protonix  18. Anxiety- Will make sure  on home doses of Benzo.  12/31- changed to home dose Ativan QID 0.5 mg 19. Respiratory insufficiency  12/30- on 5-6L high flow O2- will wean as tolerated  - will order continuous pulse ox for now  1/1- off O2 this AM while in gym.  1/2- was off for PT this AM- asking to go back on this AM- will get EKG per Cards  1/4- EKG looked good-  20. Azotemia  12/30- BUN is 32 down from 40- will monitor; Cr 1.03 down from 1.32  1/5- Cr 1.16 and BUN 18- doing better  1/1- Labs in AM 21. Likely AD episode today  12/31- paradoxical breathing, SOB, nasal congestion, anxiety- didn't c/o HA- likely due to constipation -resolved episode LOS: 8 days A FACE TO FACE EVALUATION WAS PERFORMED  Britny Riel 10/11/2019, 8:52 AM

## 2019-10-11 NOTE — Progress Notes (Signed)
Pt slept well throughout night on bipap without issue, continues to be SOB with minimal exertion

## 2019-10-11 NOTE — Progress Notes (Signed)
RN called me to patient's room right at shift change to put patient on his Bipap ([employer]reamstation).  She stated patient was getting in distress and wanted to be put on  But she was having trouble with his mask.  Put patient on.  He has 3L bled into line and has calmed down.  O2 sat when I left room was at 97%.  Will continue to montior.

## 2019-10-11 NOTE — Progress Notes (Signed)
Occupational Therapy Session Note  Patient Details  Name: TAJH LIVSEY MRN: [id number] Date of Birth: [birth year]  Today's Date: 10/11/2019 OT Individual Time: 1400-1430 OT Individual Time Calculation (min): 30 min    Short Term Goals: Week 2:  OT Short Term Goal 1 (Week 2): STG=LTG secondary to ELOS  Skilled Therapeutic Interventions/Progress Updates:    Pt resting in recliner upon arrival with wife present.  OT intervention with focus on sit<>stand, standing balance, bathing and dressing at sink, functional amb with RW, activity tolerance, and energy conservation education to increase independence with BADLs.  Sit<>stand with CGA/min A. Pt stood at sink to brush teeth and while wife assisted with LB bathing tasks. Pt's wife is eager to assist pt before allowing pt to attempt to care for himself. Wife's comments that he needs to get stronger so he can take care of himself.  Wife has not grasped that allowing pt to care for himself is important in overall recovery and increasing activity tolerance and strength.  Pt amb with RW to bathroom. Pt remained seated on toilet with wife present. Pt and wife verbalized understanding of instructions to remain seated and call for assistance before getting up.   Therapy Documentation Precautions:  Precautions Precautions: Cervical Precaution Booklet Issued: Yes (comment) Precaution Comments: verbal discussion of precautions Required Braces or Orthoses: Cervical Brace Cervical Brace: Hard collar Restrictions Weight Bearing Restrictions: No  Pain:  Pt denies pain this afternoon   Therapy/Group: Individual Therapy  [name] 10/11/2019, 2:46 PM

## 2019-10-11 NOTE — Progress Notes (Addendum)
Pt requested to get in bed sitting at almost 90 degrees, labored breathing, placed on O2 at 2L via [place] SATs are 93%, pt request Bipap however settings have been changed, called resp to reset. Reassured pt that resp will be here soon to set up.   1830: pt requested to be taken off bipap to eat. Pt repositioned in bed with labored breathing. SAT 88%, placed pt on 1L O2 via n/c, after a few min pt SAT returned to 91% on 1L O2. Returned back to room and pt requested to be placed back on bipap. Attempted to put pt back on bipap, setting correct but unable to get good seal, pt breathing continued to be labored, offered to help back to recliner as pt rest better in recliner, he request resp to place bipap. Called resp. Bipap in place. Pt resting comfortably.

## 2019-10-11 NOTE — Progress Notes (Signed)
Occupational Therapy Session Note  Patient Details  Name: [name] [name] MRN: [id number] Date of Birth: 09-Dec-1950  Today's Date: 10/11/2019 OT Individual Time: 0700-0755 OT Individual Time Calculation (min): 55 min    Short Term Goals: Week 2:  OT Short Term Goal 1 (Week 2): STG=LTG secondary to ELOS  Skilled Therapeutic Interventions/Progress Updates:    Pt asleep in recliner upon arrival but easily aroused. Pt declined shower this morning but did request use of bathroom.  Pt amb with RW to bathroom with CGA and completed toileting tasks with CGA.  Pt returned to room and stood at sink to wash his hands. Pt returned to recliner to eat breakfast.  Pt required assistance with opening containers and cutting fruit. Pt engaged in BUE table tasks with focus on improved FMC and overall strength at B shoulders (primarily L shoulder). Pt engaged in AAROM shoulder flexion while seated (3X10). Pt issued pink and blue foam blocks to increase grasp strength, primarily in L hand. Pt remained seated in recliner with belt alarm activated and all needs within reach.   Therapy Documentation Precautions:  Precautions Precautions: Cervical Precaution Booklet Issued: Yes (comment) Precaution Comments: verbal discussion of precautions Required Braces or Orthoses: Cervical Brace Cervical Brace: Hard collar Restrictions Weight Bearing Restrictions: No  Pain:  Pt denies pain this morning.   Therapy/Group: Individual Therapy  [name] 10/11/2019, 8:04 AM

## 2019-10-11 NOTE — Progress Notes (Signed)
Pt has been more alert today but reports that continues to wake up around 0300 d/t "unable to breathe". Night nurse has reported both nights that confirms he wakes up around 0300 and has Bipap off. Bipap is put back on and according to night nurse O2 SATs returns to 92/93 and pt goes back to sleep. Wife wanted to know what to do when this happens at home. Apparently pt would remove cpap in middle of night at home as well. Informed her that would do the same thing as she has done in past which is replace cpap.  So far pt has not been SOB at rest with SATs at 92% RA. Pt does continue to have +3 pitting edema to BLE with generalized edema throughout.

## 2019-10-11 NOTE — Progress Notes (Signed)
Physical Therapy Weekly Progress Note  Patient Details  Name: [name] [name] MRN: [id number] Date of Birth: 01-02-1951  Beginning of progress report period: October 04, 2019 End of progress report period: October 11, 2019  Today's Date: 10/11/2019 PT Individual Time: 0900-1000 PT Individual Time Calculation (min): 60 min   Patient has met 2 of 3 short term goals.  Pt has made good progress over the past week functionally and is able to perform transfers with use of RW at Supervision to CGA level, gait up to 150 ft with RW at Supervision to CGA level, stairs with CGA overall with use of RW for one step to enter patient's home, and remains at min A overall for bed mobility. Pt has been limited by ongoing anxiety and shortness of breath being managed medically and with breathing exercises. Pt has been able to maintain SpO2 in mid-90's during therapy sessions on room air.  Patient continues to demonstrate the following deficits muscle weakness, decreased cardiorespiratoy endurance and decreased oxygen support and decreased standing balance, decreased postural control and decreased balance strategies and therefore will continue to benefit from skilled PT intervention to increase functional independence with mobility.  Patient progressing toward long term goals..  Continue plan of care.  PT Short Term Goals Week 1:  PT Short Term Goal 1 (Week 1): Pt will ambulate x 100 ft with LRAD and min A PT Short Term Goal 1 - Progress (Week 1): Met PT Short Term Goal 2 (Week 1): Pt will navigate 4 stairs with 1 handrail and min A PT Short Term Goal 2 - Progress (Week 1): Met PT Short Term Goal 3 (Week 1): Pt will perform bed mobility with Supervision PT Short Term Goal 3 - Progress (Week 1): Progressing toward goal Week 2:  PT Short Term Goal 1 (Week 2): =LTG due to ELOS  Skilled Therapeutic Interventions/Progress Updates:    Pt received seated in recliner in room, agreeable to PT session. No complaints of  pain initially, does have onset of low back pain with mobility that decreases at rest. Sit to stand with close SBA to CGA to RW throughout session, v/c for safe hand placement and RW management during transfer. Ambulation x 150 ft with RW and close SBA to CGA for balance. Pt exhibits improved safety with RW management during gait. Ascend/descend one 4" curb step with RW and CGA for balance, mod v/c for safety, x 6 reps. Static standing balance with 0-1 UE support and CGA while performing horseshoe toss with alt L/R UE. Pt requests to attempt bed mobility again as he has been sleeping in recliner and this is increasing his low back pain. Sit to semi-reclined in bed with HOB maximally elevated with min A for RLE management. Pt becomes SOA once seated in bed and has onset of panic/anxiety. Reviewed breathing techniques from yesterday with patient and showed him that SpO2 at 90% and above on room air. Pt is able to calm down and resume normal breathing pattern. Left all 4 bedrails up so that pt can pull himself up into more of a sitting position if he again has shortness of breath or respiratory panic. Pt on room air for entire therapy session with SpO2 at 94% and above with activity. Pt left seated in bed with needs in reach, wife present at bedside.  Therapy Documentation Precautions:  Precautions Precautions: Cervical Precaution Booklet Issued: Yes (comment) Precaution Comments: verbal discussion of precautions Required Braces or Orthoses: Cervical Brace Cervical Brace: Hard collar Restrictions  Weight Bearing Restrictions: No   Therapy/Group: Individual Therapy   Excell Seltzer, PT, DPT  10/11/2019, 12:43 PM

## 2019-10-11 NOTE — Progress Notes (Signed)
Occupational Therapy Note  Patient Details  Name: [name] [name] MRN: [id number] Date of Birth: 1951-05-07  Today's Date: 10/11/2019 OT Missed Time: 30 Minutes Missed Time Reason: Patient fatigue  Pt missed 30 min skilled OT d/t fatigue. Pt unable to keep eyes opn >1 min prior to falling asleep. Will follow up per POC.   [name] 10/11/2019, 3:45 PM

## 2019-10-12 ENCOUNTER — Inpatient Hospital Stay (HOSPITAL_COMMUNITY): Payer: Medicare Other

## 2019-10-12 ENCOUNTER — Inpatient Hospital Stay (HOSPITAL_COMMUNITY): Payer: Medicare Other | Admitting: Physical Therapy

## 2019-10-12 NOTE — Progress Notes (Signed)
Physical Therapy Session Note  Patient Details  Name: [name] [name] MRN: [id number] Date of Birth: 30-Jun-1951  Today's Date: 10/12/2019 PT Individual Time: 0900-1000 PT Individual Time Calculation (min): 60 min   Short Term Goals: Week 2:  PT Short Term Goal 1 (Week 2): =LTG due to ELOS  Skilled Therapeutic Interventions/Progress Updates:    Pt received seated in recliner in room, agreeable to PT session. No complaints of pain. Pt appears to be in better spirits this date and reports improved sleep last night. Sit to stand with close SBA to CGA to RW with v/c for safe hand placement with transfer. Ambulation x 125 ft with RW and close SBA, decreased gait speed. Nustep level 4 x 10 min with use of B UE/LE for global endurance training. Ambulation weaving through cones with RW and CGA for safety with focus on safe RW management while navigating through narrow spaces, v/c for safety. Toilet transfer with CGA, min A for clothing management and independent for pericare. Pt left seated in recliner in room with needs in reach at end of session. Pt on room air throughout session, SpO2 at 90% and above with activity.  Therapy Documentation Precautions:  Precautions Precautions: Cervical Precaution Booklet Issued: Yes (comment) Precaution Comments: verbal discussion of precautions Required Braces or Orthoses: Cervical Brace Cervical Brace: Hard collar Restrictions Weight Bearing Restrictions: No    Therapy/Group: Individual Therapy   Peter Congo, PT, DPT  10/12/2019, 12:50 PM

## 2019-10-12 NOTE — Progress Notes (Signed)
Checked home CPAP, placed the patient on the CPAP 14L. Will check on him q2.

## 2019-10-12 NOTE — Progress Notes (Signed)
Sent note to physician to call [facility] to make the change to 14 for the patient. Wife is going home to pick up home CPAP. Will set up for patient as necessary when she returns.

## 2019-10-12 NOTE — Progress Notes (Signed)
Pt slept on and off during night on bipap. somewhat anxious overnight. Slept Part in bed then in chair. ambulated to BR on RA without issue

## 2019-10-12 NOTE — Progress Notes (Signed)
Came into patient's room, he wanted to go back to bed from chair but has trouble moving without his CPAP machine on.  Had to place water in machine, put patient on while he was standing, and helped RN assist patient to bed.  Patient's wife brought his home unit today, so unplugged our dreamstation and removed from room.  Patient sat is between 93% and 95% on his unit.  Patient states he does not use O2 on his home unit.  RN and I will continue to monitor O2 sat due to fact we have ran O2 through our Dreamstation while patient has been using it.

## 2019-10-12 NOTE — Progress Notes (Signed)
[place] PHYSICAL MEDICINE & REHABILITATION PROGRESS NOTE   Subjective/Complaints:   Pt reports just got bath with nursing- is a little tired- upset about [place]; slept well; doesn't think having issues with breathing due to fluid overload.     ROS- pt denied  CP, HA, N/V/D; abd pain/fullness . Denies SOB when up; only when laying down Objective:   No results found. No results for input(s): WBC, HGB, HCT, PLT in the last 72 hours. No results for input(s): NA, K, CL, CO2, GLUCOSE, BUN, CREATININE, CALCIUM in the last 72 hours.  Intake/Output Summary (Last 24 hours) at 10/12/2019 0859 Last data filed at 10/11/2019 1238 Gross per 24 hour  Intake -  Output 350 ml  Net -350 ml     Physical Exam: Vital Signs Blood pressure 129/61, pulse 93, temperature (!) 97.4 F (36.3 C), resp. rate 20, height 5\' 6"  (1.676 m), weight 102.7 kg, SpO2 98 %.  Constitutional:  Obese, barrel chested man sitting in bedside chair; bright cheeks; off O2; just finished bathing with nursing ; NAD HENT:  Head: Atraumatic.  Mouth/Throat: Oropharynx is clear  Eyes:  EOM are normal.  Neck:  Incision on anterior neck-, but appears to be glued- a lot of bruising and ecchymosis which is resolving Cardiovascular:irregular rhythm, rate in 90s-100s with walking Respiratory: CTA B/L- no W/R/R great air movement B/L- GI: soft, NT, ND, (+)BS  Musculoskeletal:  Comments: RUE bicep 4+/5, WE 4+/5, triceps 4+/5, grip 4+/5, and finger abd 4+/5 LUE- biceps 3+/5, WE 3+/5, triceps 3/5, grip 3/5, finger abd 3-/5 RLE- HF 5-/5, KE 4+/5, DF 5-/5, PF 5-/5, EHL 4+/5 LLE- HF 4-/5, KE 4/5, DF 4-/5, PF 4/5, EHL 4-/5  Neurological: . Oriented x3. Hoffman's B/L in UEs- no clonus B/L Decreased sensation to light touch L C5-T1 otherwise intact to light touch in all 3 other extremities and torso B/L Skin:  Surgical site neck hematoma resolving. Noted ecchymosis at site. Psychiatric:  Slightly depressed affect about world  issues   Assessment/Plan: 1. 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  Physiatrist is providing close team supervision and 24 hour management of active medical problems listed below.  Physiatrist and rehab team continue to assess barriers to discharge/monitor patient progress toward functional and medical goals  Care Tool:  Bathing    Body parts bathed by patient: Right arm, Left arm, Chest, Abdomen, Front perineal area, Right upper leg, Left upper leg, Right lower leg, Left lower leg, Face   Body parts bathed by helper: Buttocks     Bathing assist Assist Level: Minimal Assistance - Patient > 75%     Upper Body Dressing/Undressing Upper body dressing   What is the patient wearing?: Pull over shirt    Upper body assist Assist Level: Moderate Assistance - Patient 50 - 74%    Lower Body Dressing/Undressing Lower body dressing      What is the patient wearing?: Pants     Lower body assist Assist for lower body dressing: Moderate Assistance - Patient 50 - 74%     Toileting Toileting    Toileting assist Assist for toileting: Contact Guard/Touching assist     Transfers Chair/bed transfer  Transfers assist     Chair/bed transfer assist level: Contact Guard/Touching assist Chair/bed transfer assistive device: Programmer, multimedia   Ambulation assist      Assist level: Contact Guard/Touching assist Assistive device: Walker-rolling  Max distance: 150'   Walk 10 feet activity   Assist     Assist level: Contact Guard/Touching assist Assistive device: Walker-rolling   Walk 50 feet activity   Assist    Assist level: Contact Guard/Touching assist Assistive device: Walker-rolling    Walk 150 feet activity   Assist Walk 150 feet activity did not occur: Safety/medical concerns  Assist level: Contact Guard/Touching  assist Assistive device: Walker-rolling    Walk 10 feet on uneven surface  activity   Assist Walk 10 feet on uneven surfaces activity did not occur: Safety/medical concerns         Wheelchair     Assist Will patient use wheelchair at discharge?: No             Wheelchair 50 feet with 2 turns activity    Assist            Wheelchair 150 feet activity     Assist          Blood pressure 129/61, pulse 93, temperature (!) 97.4 F (36.3 C), resp. rate 20, height 5\' 6"  (1.676 m), weight 102.7 kg, SpO2 98 %. Problem List and Plan:  1. 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 Soft Cervical collar when OOB  -patient may shower if cover cervical incision  -ELOS/Goals: 16-20 days/Goals Supervision- CGA   1/5- will change to 15/7 2. Antithrombotics:  -DVT/anticoagulation: Resume Eliquis 5 mg twice daily as prior to admission; off heparin gtt  -antiplatelet therapy: [name]  3. Pain Management: Flexeril and oxycodone as needed  4. Mood: Zoloft 50 mg daily, Klonopin 0.25 mg twice daily- will check if was changed from dose at home;   12/31- changed to Ativan QID 0.5 mg- home dose -antipsychotic agents: [name]  5. Neuropsych: This patient is capable of making decisions on his own behalf.  6. Skin/Wound Care: Routine skin checks  7. Fluids/Electrolytes/Nutrition: Routine in and outs with follow-up chemistries  8. History of atrial fibrillation. Patient transitioned from intravenous heparin back to chronic Eliquis. Cardiac rate controlled. Continue Cardizem 100 mg daily, Lopressor 12.5 mg twice daily   1/2- rate on low side this AM- will check EKG after d/w Cardiology. Will determine plan once done.  1/3- EKG looks good- will con't regimen 9. Hypertension. Aldactone 25 mg daily,  hydralazine 75 mg 3 times daily. Monitor with increased mobility   1/5: labile, continue to monitor- is chronically labile per pt. 10. History of CAD with CABG 2004. No complaints of increasing chest pain.  11. Leukocytosis. Felt to be steroid induced. Prednisone taper completed. Follow-up CBC   12/30- down to 21.7 form 28k  1/2- WBC down to 16.1k- doing great  1/4- WBC down to 14.1k 12. Diastolic congestive heart failure. Monitor for any signs of fluid overload   12/30- no weights yet  1/1- didn't have daily weights- changed order   Filed Weights   10/09/19 0302 10/10/19 0439 10/12/19 0502  Weight: 103.5 kg 106.1 kg 102.7 kg   1/3- weight stable  1/5- Weight going up - up 4 kg since this past Friday- pt was on Lasix 20 mg prn at home- feels SOB when lays down- c/w fluid overload although Resp exam wasn't- will give Lasix 40 mg x1- know has QT prolongation, but was on at home and tolerated.  1/6- no weight for today- but voided a lot yesterday  1/7-  weight down to 102.7kg  13. OSA. Currently on BiPAP.   12/30- back on BiPAP last night- will ask reps therapy to address loose fitting mask.  1/1- doing BiPAP setting here- will speak with reps therapy plan.  1/4: will ask RT to evaluate machine as he wakes with feeling of SOB at night. If machine is working well, symptoms could be related to anxiety more than respiratory etiology and may benefit from prn Ativan at night.  14. Hyperlipidemia. Lipitor  15. BPH and neurogenic bladder. Flomax 0.8 mg daily. Check PVRs for 3 days and cath if necessary  16. Constipation/Neurogenic bowel. Laxative assistance; will clean out and see if has control of stool and if not, will put on bowel program.   12/31- will order Mg citrate and then 3-4 hours later, an enema to clear him out. KUB wasn't real helpful, but had more stool when assessed it myself than discussed.  1/1- cleaned out- 2 very large BMs- feeling better 17. GERD. Protonix  18. Anxiety- Will  make sure on home doses of Benzo.  12/31- changed to home dose Ativan QID 0.5 mg 19. Respiratory insufficiency  12/30- on 5-6L high flow O2- will wean as tolerated  - will order continuous pulse ox for now  1/1- off O2 this AM while in gym.  1/2- was off for PT this AM- asking to go back on this AM- will get EKG per Cards  1/4- EKG looked good-  20. Azotemia  12/30- BUN is 32 down from 40- will monitor; Cr 1.03 down from 1.32  1/5- Cr 1.16 and BUN 18- doing better  1/1- Labs in AM 21. Likely AD episode today  12/31- paradoxical breathing, SOB, nasal congestion, anxiety- didn't c/o HA- likely due to constipation -resolved episode LOS: 9 days A FACE TO FACE EVALUATION WAS PERFORMED  [name] [name] 10/12/2019, 8:59 AM

## 2019-10-12 NOTE — Progress Notes (Signed)
Social Work Patient ID: [name] [name], male   DOB: Feb 10, 1951, 69 y.o.   MRN: [id number]   Met with pt and wife following team conference.  Both aware and agreeable with targeted d/c date of 1/12 and supervision goals overall.  Wife has not concerns about providing support at home, however, is concerned about his continued need for O2.  She is here daily and aware to discuss these concerns with MD.  Lennart Pall, LCSW

## 2019-10-12 NOTE — Progress Notes (Signed)
   10/12/19 1310  What Happened  Was fall witnessed? No  Was patient injured? No  Patient found on floor  Found by Staff-comment  Stated prior activity other (comment) (sitting in recliner)  Follow Up  MD notified Linna Hoff, [place]  Family notified Yes - comment  Time family notified 1400  Additional tests No  Adult Fall Risk Assessment  Risk Factor Category (scoring not indicated) High fall risk per protocol (document High fall risk)  Age 69  Fall History: Fall within 6 months prior to admission 0  Elimination; Bowel and/or Urine Incontinence 0  Elimination; Bowel and/or Urine Urgency/Frequency 0  Medications: includes PCA/Opiates, Anti-convulsants, Anti-hypertensives, Diuretics, Hypnotics, Laxatives, Sedatives, and Psychotropics 5  Patient Care Equipment 1  Mobility-Assistance 2  Mobility-Gait 2  Mobility-Sensory Deficit 0  Altered awareness of immediate physical environment 0  Impulsiveness 0  Lack of understanding of one's physical/cognitive limitations 0  Total Score 11  Patient Fall Risk Level Moderate fall risk  Adult Fall Risk Interventions  Required Bundle Interventions *See Row Information* Moderate fall risk - low and moderate requirements implemented  Additional Interventions Lap belt while in chair/wheelchair;Family Supervision;Use of appropriate toileting equipment (bedpan, BSC, etc.)  Screening for Fall Injury Risk (To be completed on HIGH fall risk patients) - Assessing Need for Low Bed  Risk For Fall Injury- Low Bed Criteria Previous fall this admission  Will Implement Low Bed and Floor Mats No - Criteria no longer met for low bed  Screening for Fall Injury Risk (To be completed on HIGH fall risk patients who do not meet crieteria for Low Bed) - Assessing Need for Floor Mats Only  Risk For Fall Injury- Criteria for Floor Mats Bleeding risk-anticoagulation (not prophylaxis)  Will Implement Floor Mats Yes  Vitals  Temp 98 F (36.7 C)  Temp Source Oral  BP (!) 142/67  BP  Location Left Arm  BP Method Automatic  Patient Position (if appropriate) Sitting  Pulse Rate 85  Pulse Rate Source Dinamap  Resp 19  Oxygen Therapy  SpO2 94 %  O2 Device Room Air  Pain Assessment  Pain Scale 0-10  Pain Score 0  PCA/Epidural/Spinal Assessment  Respiratory Pattern Regular;Unlabored;Dyspnea with exertion  Neurological  Neuro (WDL) WDL  Level of Consciousness Alert  Orientation Level Oriented X4  Cognition Appropriate at baseline  Speech Clear  R Hand Grip Moderate  L Hand Grip Weak   RUE Motor Response Purposeful movement  RUE Motor Strength 4  LUE Motor Response Purposeful movement  LUE Sensation Decreased  LUE Motor Strength 4  RLE Motor Response Purposeful movement  RLE Motor Strength 4  LLE Motor Response Purposeful movement  LLE Motor Strength 4  Neuro Symptoms Anxiety  Neuro symptoms relieved by Anti-anxiety medication  Musculoskeletal  Musculoskeletal (WDL) X  Assistive Device Front wheel walker;Wheelchair  Generalized Weakness Yes  Weight Bearing Restrictions No  Musculoskeletal Details  RUE Full movement  LUE Full movement;Weakness  RLE Full movement;Weakness  LLE Full movement;Weakness  Integumentary  Integumentary (WDL) X  Skin Condition Dry  Skin Integrity Ecchymosis;MASD  Ecchymosis Location Chest;Neck  Ecchymosis Location Orientation Anterior  Ecchymosis Intervention Other (Comment) (assessed)  Moisture Associated Skin Damage Location Buttocks  Moisture Associated Skin Damage Orientation Right;Left  Moisture Associated Skin Damage Intervention Cleansed;Barrier cream  Skin Turgor Non-tenting   Erie Noe, RN

## 2019-10-12 NOTE — Progress Notes (Signed)
Occupational Therapy Session Note  Patient Details  Name: [name] [name] MRN: [id number] Date of Birth: Oct 08, 1950  Today's Date: 10/12/2019 OT Individual Time: 0700-0800 OT Individual Time Calculation (min): 60 min    Short Term Goals: Week 2:  OT Short Term Goal 1 (Week 2): STG=LTG secondary to ELOS  Skilled Therapeutic Interventions/Progress Updates:    Pt resting in recliner upon arrival.  Pt required increased time to awaken adequately to actively participate in therapy. Pt requested to eat breakfast before taking shower. Pt required assistance opening containers but was able to finish eating without assistance. Pt with limited functional use of LUE/hand but continues to initiate use during functional tasks.  Pt stood at sink to brush teeth and requested a short break before amb with RW to bathroom to use toilet and take shower.  Pt amb with RW (CGA) to bathroom and completed toileting tasks and shower with sit<>stand from seat.  Pt required assistance cleaning buttocks and washing hair. Pt required rest breaks X 3 during shower 2/2 SOB. Pt returned to room and completed dressing tasks with sit<>stand from recliner. Pt required min A for donning shirt. Pt completed all tasks on RA with O2 sats>90 throughout session. Pt remained seated in recliner with all needs within reach.   Therapy Documentation Precautions:  Precautions Precautions: Cervical Precaution Booklet Issued: Yes (comment) Precaution Comments: verbal discussion of precautions Required Braces or Orthoses: Cervical Brace Cervical Brace: Hard collar Restrictions Weight Bearing Restrictions: No Pain:  Pt denies pain   Therapy/Group: Individual Therapy  [name] 10/12/2019, 8:07 AM

## 2019-10-12 NOTE — Plan of Care (Signed)
  Problem: Consults Goal: RH SPINAL CORD INJURY PATIENT EDUCATION Description:  See Patient Education module for education specifics.  Outcome: Progressing   Problem: SCI BOWEL ELIMINATION Goal: RH STG MANAGE BOWEL WITH ASSISTANCE Description: STG Manage Bowel with min Assistance. Outcome: Progressing Goal: RH STG SCI MANAGE BOWEL WITH MEDICATION WITH ASSISTANCE Description: STG SCI Manage bowel with medication with min assistance. Outcome: Progressing   Problem: SCI BLADDER ELIMINATION Goal: RH STG MANAGE BLADDER WITH ASSISTANCE Description: STG Manage Bladder With min Assistance Outcome: Progressing Goal: RH STG MANAGE BLADDER WITH EQUIPMENT WITH ASSISTANCE Description: STG Manage Bladder With Equipment With max Assistance Outcome: Progressing   Problem: RH SKIN INTEGRITY Goal: RH STG SKIN FREE OF INFECTION/BREAKDOWN Description: Skin to remain free from breakdown while on rehab with min assist. Outcome: Progressing Goal: RH STG MAINTAIN SKIN INTEGRITY WITH ASSISTANCE Description: STG Maintain Skin Integrity With min Assistance. Outcome: Progressing Goal: RH STG ABLE TO PERFORM INCISION/WOUND CARE W/ASSISTANCE Description: STG Able To Perform Incision/Wound Care With min Assistance. Outcome: Progressing   Problem: RH SAFETY Goal: RH STG ADHERE TO SAFETY PRECAUTIONS W/ASSISTANCE/DEVICE Description: STG Adhere to Safety Precautions With min Assistance and appropriate assistive Device. Outcome: Progressing   Problem: RH PAIN MANAGEMENT Goal: RH STG PAIN MANAGED AT OR BELOW PT'S PAIN GOAL Description: <3 on a 0-10 pain scale Outcome: Progressing

## 2019-10-13 ENCOUNTER — Inpatient Hospital Stay (HOSPITAL_COMMUNITY): Payer: Medicare Other | Admitting: Physical Therapy

## 2019-10-13 ENCOUNTER — Inpatient Hospital Stay (HOSPITAL_COMMUNITY): Payer: Medicare Other

## 2019-10-13 DIAGNOSIS — G959 Disease of spinal cord, unspecified: Secondary | ICD-10-CM

## 2019-10-13 DIAGNOSIS — I5032 Chronic diastolic (congestive) heart failure: Secondary | ICD-10-CM

## 2019-10-13 DIAGNOSIS — R0602 Shortness of breath: Secondary | ICD-10-CM

## 2019-10-13 DIAGNOSIS — M7989 Other specified soft tissue disorders: Secondary | ICD-10-CM

## 2019-10-13 LAB — BRAIN NATRIURETIC PEPTIDE: B Natriuretic Peptide: 106.5 pg/mL — ABNORMAL HIGH (ref 0.0–100.0)

## 2019-10-13 MED ORDER — LORAZEPAM 1 MG PO TABS
2.0000 mg | ORAL_TABLET | ORAL | Status: AC
  Administered 2019-10-13: 10:00:00 2 mg via ORAL
  Filled 2019-10-13: qty 2

## 2019-10-13 MED ORDER — IOHEXOL 350 MG/ML SOLN
100.0000 mL | Freq: Once | INTRAVENOUS | Status: AC | PRN
  Administered 2019-10-13: 11:00:00 100 mL via INTRAVENOUS

## 2019-10-13 NOTE — Consult Note (Addendum)
NAME:  [name] [name], MRN:  [id number], DOB:  [birth year], LOS: 10 ADMISSION DATE:  10/03/2019, CONSULTATION DATE:  1/8 REFERRING MD:  Dr, Berline Chough (CIR), CHIEF COMPLAINT:  Hypoxemia  Brief History   69 year old man with OSA, hypertension, atrial fibrillation underwent multilevel ACDF on 12/17 and developed acute respiratory distress 12/18 requiring transfer to ICU and BiPAP. Discharged to CIR where he continues to have a new oxygen requirement.   History of present illness   69 year old male with PMH as below, which is significant for OSA, HTN, and AF. He was admitted recently for elective ACDF of C3-4, C4-5, and C5-6. Surgery was complicated by surgical site hematoma, which ultimately did not require surgical intervention. Course also complicated by respiratory distress prompting ICU transfer and BiPAP. He required BiPAP off and on for several days. Etiology not entirely clear, but was felt to be due to some combination of neck swelling/hematoma and volume overload. There was some question of diaphragmatic injury vs PE. Ultimately his BiPAP requirement was waned down to QHS and he was discharged to CIR on 12/29. Since his time there he has had limited ability to wean off of supplemental oxygen, which has inhibited his therapy. PCCM consulted for hypoxia.  Past Medical History   has a past medical history of Anxiety, CHF (congestive heart failure) (HCC), Coronary artery disease, COVID-19, DJD (degenerative joint disease), Dyspnea, GERD (gastroesophageal reflux disease), History of kidney stones, HLD (hyperlipidemia), Hypertension, MI, old (2004), OA (osteoarthritis), Obesity, Persistent atrial fibrillation (HCC), Sleep apnea, Spinal stenosis, and Typical atrial flutter (HCC).  Significant Hospital Events   12/17 >12/29: [facility] admission for c-spine surgery 12/29 admit to [facility] 1/8 PCCM consult for hypoxia SOB  Consults:  PCCM 1/8  Procedures:    Significant Diagnostic Tests:  CTA chest 1/8 >  Negative for acute pulmonary embolism. Volume loss of bilateral lower chest, left greater than right, with elevation of the left and right hemidiaphragm. Cardiomegaly without signs of pericardial effusion. Mild stranding in the anterior neck greater on the left than the right, may represent edema or inflammation Sniff test 12/28 > Limited motion with low volume chest of the right and left hemidiaphragm. No signs of paradoxical motion to suggest diaphragmatic paralysis  Micro Data:    Antimicrobials:    Interim history/subjective:  Feels SOB. Orthopnea most notable symptom. He is able to ambulate, but is limited to a few steps before needing to rest.   Objective   Blood pressure (!) 156/78, pulse 87, temperature 97.9 F (36.6 C), resp. rate 18, height 5\' 6"  (1.676 m), weight 102.1 kg, SpO2 94 %.        Intake/Output Summary (Last 24 hours) at 10/13/2019 1355 Last data filed at 10/13/2019 0936 Gross per 24 hour  Intake 600 ml  Output --  Net 600 ml   Filed Weights   10/10/19 0439 10/12/19 0502 10/13/19 0500  Weight: 106.1 kg 102.7 kg 102.1 kg    Examination: General: Overweight male appears dyspneic off O2 HENT: [place]/AT, PERRL, no JVD Lungs: Diminished in bilateral bases. Clear otherwise. In mild distress with accessory muscle use.  Cardiovascular: RRR, no MRG Abdomen: Soft, non-tender Extremities: 1+ pedal edema.  Neuro: Alert, oriented, non focal.   Resolved Hospital Problem list     Assessment & Plan:    Acute hypoxic respiratory failure: Etiology seems tied to cervical injury/surgery/hematoma. Level of injury consistent with diaphragmatic involvement. No paralysis on sniff test from 12/28, but limited mobility. CT done 1/8 shows no  PE, no pulmonary edema, no infection, no inflammation. Only shows atelectasis related to hypoventilation.Expect his symptoms to improve slowly. He does report some exercise  Intolerance even before this admission, which is not well understood, but  given his prior cardiac history perhaps outpatient cardiology evaluation is warranted. CHF/pulmonary edema are not a major contributing factor to his symptoms at this time.   Plan: Supplemental O2 as needed to keep SpO2 90-95% Incentive spirometry is a must Pulmonary hygiene Cough/deep breathing Continued physical rehabilitation  Obstructive sleep apnea  Plan: Transitioned from BiPAP to home CPAP device 1/8 and reportedly has tolerated well.  Continue CPAP during all sleeping hours.     Labs   CBC: Recent Labs  Lab 10/07/19 0341 10/09/19 0520  WBC 16.1* 14.4*  NEUTROABS 12.3* 12.1*  HGB 13.4 13.3  HCT 38.7* 39.8  MCV 85.2 88.4  PLT 275 240    Basic Metabolic Panel: Recent Labs  Lab 10/07/19 0341 10/09/19 0520  NA 136 138  K 4.0 4.1  CL 99 101  CO2 27 26  GLUCOSE 114* 117*  BUN 23 18  CREATININE 1.15 1.16  CALCIUM 9.2 9.3   GFR: Estimated Creatinine Clearance: 70.1 mL/min (by C-G formula based on SCr of 1.16 mg/dL). Recent Labs  Lab 10/07/19 0341 10/09/19 0520  WBC 16.1* 14.4*    Liver Function Tests: Recent Labs  Lab 10/09/19 0520  AST 18  ALT 26  ALKPHOS 81  BILITOT 0.8  PROT 6.4*  ALBUMIN 2.8*   No results for input(s): LIPASE, AMYLASE in the last 168 hours. No results for input(s): AMMONIA in the last 168 hours.  ABG    Component Value Date/Time   PHART 7.474 (H) 09/27/2019 0008   PCO2ART 42.6 09/27/2019 0008   PO2ART 81.0 (L) 09/27/2019 0008   HCO3 31.4 (H) 09/27/2019 0008   TCO2 33 (H) 09/27/2019 0008   O2SAT 97.0 09/27/2019 0008     Coagulation Profile: No results for input(s): INR, PROTIME in the last 168 hours.  Cardiac Enzymes: No results for input(s): CKTOTAL, CKMB, CKMBINDEX, TROPONINI in the last 168 hours.  HbA1C: Hgb A1c MFr Bld  Date/Time Value Ref Range Status  03/28/2015 05:17 AM 6.2 (H) 4.8 - 5.6 % Final    Comment:    (NOTE)         Pre-diabetes: 5.7 - 6.4         Diabetes: >6.4         Glycemic control  for adults with diabetes: <7.0   07/25/2014 10:16 AM 6.0 (H) <5.7 % Final    Comment:                                                                           According to the ADA Clinical Practice Recommendations for 2011, when HbA1c is used as a screening test:     >=6.5%   Diagnostic of Diabetes Mellitus            (if abnormal result is confirmed)   5.7-6.4%   Increased risk of developing Diabetes Mellitus   References:Diagnosis and Classification of Diabetes Mellitus,Diabetes Care,2011,34(Suppl 1):S62-S69 and Standards of Medical Care in         Diabetes - 2011,Diabetes NTIR,4431,54 (  Suppl 1):S11-S61.      CBG: No results for input(s): GLUCAP in the last 168 hours.  Review of Systems:   Negative except as outlined above.   Past Medical History  He,  has a past medical history of Anxiety, CHF (congestive heart failure) (HCC), Coronary artery disease, COVID-19, DJD (degenerative joint disease), Dyspnea, GERD (gastroesophageal reflux disease), History of kidney stones, HLD (hyperlipidemia), Hypertension, MI, old (2004), OA (osteoarthritis), Obesity, Persistent atrial fibrillation (HCC), Sleep apnea, Spinal stenosis, and Typical atrial flutter (HCC).   Surgical History    Past Surgical History:  Procedure Laterality Date  . ANTERIOR CERVICAL DECOMP/DISCECTOMY FUSION [name] 09/21/2019   Procedure: ANTERIOR CERVICAL DECOMPRESSION/DISCECTOMY FUSION CERVICAL THREE- CERVICAL FOUR, CERVICAL FOUR- CERVICAL FIVE, CERVICAL FIVE- CERVICAL SIX;  Surgeon: Tressie Stalker, MD;  Location: [facility] OR;  Service: Neurosurgery;  Laterality: [name];  ANTERIOR CERVICAL DECOMPRESSION/DISCECTOMY FUSION CERVICAL THREE- CERVICAL FOUR, CERVICAL FOUR- CERVICAL FIVE, CERVICAL FIVE- CERVICAL SIX  . ATRIAL FIBRILLATION ABLATION [name] 05/24/2018   Procedure: ATRIAL FIBRILLATION ABLATION;  Surgeon: Hillis Range, MD;  Location: [facility] INVASIVE CV LAB;  Service: Cardiovascular;  Laterality: [name];  . BACK SURGERY  2011  .  CARPAL TUNNEL RELEASE     bilateral  . CHOLECYSTECTOMY    . CORONARY ARTERY BYPASS GRAFT     2004  . ELECTROPHYSIOLOGIC STUDY [name] 09/15/2016   Procedure: Atrial Fibrillation Ablation;  Surgeon: Hillis Range, MD;  Location[facility] INVASIVE CV LAB;  Service: Cardiovascular;  Laterality: [name];  . fistula surgery    . hemorrhoidectomy    . LEFT HEART CATH AND CORS/GRAFTS ANGIOGRAPHY [name] 02/07/2018   Procedure: LEFT HEART CATH AND CORS/GRAFTS ANGIOGRAPHY;  Surgeon: [name], Peter M, MD;  Location: [facility] INVASIVE CV LAB;  Service: Cardiovascular;  Laterality: [name];  . LUMBAR LAMINECTOMY    . right foot fracture    . TEE WITHOUT CARDIOVERSION [name] 09/15/2016   Procedure: TRANSESOPHAGEAL ECHOCARDIOGRAM (TEE);  Surgeon: Lewayne Bunting, MD;  Location: [facility] ENDOSCOPY;  Service: Cardiovascular;  Laterality: [name];  . TOTAL KNEE ARTHROPLASTY Left 07/07/2017   Procedure: LEFT TOTAL KNEE ARTHROPLASTY;  Surgeon: Ranee Gosselin, MD;  Location: [facility] ORS;  Service: Orthopedics;  Laterality: Left;  . TRANSTHORACIC ECHOCARDIOGRAM  08/25/2010   EF 55-60%     Social History   reports that he has never smoked. He has never used smokeless tobacco. He reports current alcohol use. He reports that he does not use drugs.   Family History   His family history includes Breast cancer in his mother; Coronary artery disease in his father; Hypertension in his father and mother; Stroke in his father.   Allergies Allergies  Allergen Reactions  . Vancomycin Anaphylaxis, Shortness Of Breath and Other (See Comments)    Was informed after heart surgery Breathing problem patient unsure  . Aldactone [Spironolactone]     GYNECOMASTIA   . Chlorthalidone Other (See Comments)    GOUT  . Nsaids Other (See Comments)    On blood thinner     Home Medications  Prior to Admission medications   Medication Sig Start Date End Date Taking? Authorizing Provider  acetaminophen (TYLENOL) 500 MG tablet Take 1,000 mg by mouth every 6 (six) hours as  needed for moderate pain.    [provider]  apixaban (ELIQUIS) 5 MG TABS tablet Take 1 tablet (5 mg total) by mouth 2 (two) times daily. 03/17/19   Antoine Poche, MD  atorvastatin (LIPITOR) 20 MG tablet TAKE 1 TABLET BY MOUTH EVERY DAY 09/21/19   Branch,  Dorothe Pea, MD  clonazePAM (KLONOPIN) 0.25 MG disintegrating tablet Take 1 tablet (0.25 mg total) by mouth 2 (two) times daily. 10/03/19   Val Eagle D, NP  Coenzyme Q10 (CO Q 10 PO) Take 400 mg by mouth daily.     [provider]  cyclobenzaprine (FLEXERIL) 10 MG tablet Take 1 tablet (10 mg total) by mouth 3 (three) times daily as needed for muscle spasms. 10/03/19   Val Eagle D, NP  diltiazem (CARDIZEM CD) 180 MG 24 hr capsule Take 1 capsule (180 mg total) by mouth daily. 03/17/19 07/31/20  Antoine Poche, MD  docusate sodium (COLACE) 100 MG capsule Take 1 capsule (100 mg total) by mouth 2 (two) times daily. 10/03/19   Val Eagle D, NP  eplerenone (INSPRA) 25 MG tablet TAKE 1 TABLET BY MOUTH TWICE A DAY Patient taking differently: Take 25 mg by mouth 2 (two) times daily.  07/07/19   Antoine Poche, MD  furosemide (LASIX) 20 MG tablet TAKE 1 TABLET BY MOUTH DAILY AS NEEDED FOR SWELLING/WEIGHT GAIN 09/21/19   Antoine Poche, MD  hydrALAZINE (APRESOLINE) 50 MG tablet Take 1.5 tablets (75 mg total) by mouth 3 (three) times daily. 09/12/19   Antoine Poche, MD  irbesartan (AVAPRO) 300 MG tablet Take 1 tablet (300 mg total) by mouth daily. Patient taking differently: Take 300 mg by mouth at bedtime.  09/13/19   Antoine Poche, MD  LORazepam (ATIVAN) 0.5 MG tablet Take 1 tablet (0.5 mg total) by mouth 3 (three) times daily. 07/20/19   Ivery Quale, PA-C  metoprolol tartrate (LOPRESSOR) 25 MG tablet TAKE 1 TABLET (25 MG TOTAL) BY MOUTH EVERY 8 (EIGHT) HOURS AS NEEDED (A-FIB). Patient taking differently: Take 25 mg by mouth daily.  07/07/19   Antoine Poche, MD  Multiple Vitamin (MULTIVITAMIN)  tablet Take 1 tablet by mouth daily.    [provider]  nitroGLYCERIN (NITROSTAT) 0.4 MG SL tablet Place 1 tablet (0.4 mg total) under the tongue every 5 (five) minutes x 3 doses as needed for chest pain. Patient not taking: Reported on 09/13/2019 02/07/18   Laverda Page B, NP  omeprazole (PRILOSEC) 20 MG capsule TAKE 1 CAPSULE BY MOUTH EVERY OTHER DAY. Patient taking differently: Take 20 mg by mouth every other day.  11/01/18   Antoine Poche, MD  oxyCODONE 10 MG TABS Take 1 tablet (10 mg total) by mouth every 4 (four) hours as needed for severe pain ((score 7 to 10)). 10/03/19   Val Eagle D, NP  PRESCRIPTION MEDICATION Pt uses CPAP machine at bedtime    [provider]  sertraline (ZOLOFT) 50 MG tablet Take 50 mg by mouth daily.    [provider]  tamsulosin (FLOMAX) 0.4 MG CAPS capsule Take 2 capsules (0.8 mg total) by mouth daily. 10/04/19   Floreen Comber, NP      Joneen Roach, AGACNP-BC [facility]  See Amion for personal pager PCCM on call pager [phone]  10/13/2019 3:06 PM   PCCM:  69 yo M, admitted OSA, HTN, ACDF c3-6, surgery complicated by hematoma. Pulmonary consulted for concern of phrenic injury. CT reviewed and does have basilar atelectasis and low lung volumes. No CTPE.   BP (!) 157/71 (BP Location: Left Arm)   Pulse 92   Temp 98.2 F (36.8 C)   Resp 16   Ht 5\' 6"  (1.676 m)   Wt 102.1 kg   SpO2 93%   BMI 36.33 kg/m  Gen: obese male, resting in chair  HENT: trachea midline  Neck: large, minimal accessory muscle use  Lungs: clear to ascultation  Heart: RRR, S1 s2  Abd: soft, nt nd, some abdominal respirations   Ext: no edema   Labs: reviewed  CT Chest: reviewed   ECHO: Study Conclusions - Left ventricle: The cavity size was normal. There was mild   concentric hypertrophy. Systolic function was normal. The   estimated ejection fraction was in the range of 60% to 65%. Wall   motion was  normal; there were no regional wall motion   abnormalities. Features are consistent with a pseudonormal left   ventricular filling pattern, with concomitant abnormal relaxation   and increased filling pressure (grade 2 diastolic dysfunction).   Doppler parameters are consistent with high ventricular filling   pressure. - Aortic valve: Mildly to moderately calcified annulus. Trileaflet.  A: Acute hypoxemic respiratory failure  Cervical surgery, ACDF of C-spine  Possible diaphragmatic weakness  Some abdominal breathing  Chronic Diastolic Heart Failure, grade 2  BL LE edema   P:  Continue home CPAP home setting  Can consider sniff test or EMG to help prove this Continue PT/OT  Check BNP to compare to previous as may have component of heart failure  If BNP elevated may need to consider further diuresis.   Pulmonary consult will not follow over the weekend unless needed urgently. If needed please call [phone] pager.   Garner Nash, DO [facility] 10/13/2019 5:20 PM

## 2019-10-13 NOTE — Progress Notes (Signed)
Occupational Therapy Note  Patient Details  Name: [name] [name] MRN: [id number] Date of Birth: October 28, 1950  Today's Date: 10/13/2019 OT Missed Time: 30 Minutes Missed Time Reason: Patient fatigue(awaiting doppler)   Pt missed 30 min of skilled OT d/t fatigue and awaiting doppler in LUE scanning for blood clot. Will follow up per POC/test results   Shon Hale 10/13/2019, 11:50 AM

## 2019-10-13 NOTE — Progress Notes (Signed)
Physical Therapy Session Note  Patient Details  Name: [name] [name] MRN: [id number] Date of Birth: [birth year]  Today's Date: 10/13/2019 PT Missed Time: 60 min Missed Time Reason: patient illness (SOA; pt reports not feeling well)     Short Term Goals: Week 2:  PT Short Term Goal 1 (Week 2): =LTG due to ELOS  Skilled Therapeutic Interventions/Progress Updates:    Attempted to see patient for scheduled therapy session. Per pt report and OT report this AM pt not feeling well, reports feeling SOA and fatigued. Per RN pt to leave floor soon for more testing. Pt declines any participation at this time due to not feeling well. Pt missed 60 min of scheduled skilled therapy. Will follow up per POC.  Therapy Documentation Precautions:  Precautions Precautions: Cervical Precaution Booklet Issued: Yes (comment) Precaution Comments: verbal discussion of precautions Required Braces or Orthoses: Cervical Brace Cervical Brace: Hard collar Restrictions Weight Bearing Restrictions: No    Therapy/Group: Individual Therapy   Peter Congo, PT, DPT  10/13/2019, 9:21 AM

## 2019-10-13 NOTE — Progress Notes (Signed)
Pt noted with increased swelling. Pt reported difficulty sleeping last night as well.

## 2019-10-13 NOTE — Procedures (Signed)
NIF was > -40 FVC was 450 ml  Both done with good patient effort.

## 2019-10-13 NOTE — Progress Notes (Signed)
Occupational Therapy Session Note  Patient Details  Name: [name] [name] MRN: [id number] Date of Birth: 1951-01-09  Today's Date: 10/13/2019 OT Individual Time: 0700-0755 OT Individual Time Calculation (min): 55 min    Short Term Goals: Week 2:  OT Short Term Goal 1 (Week 2): STG=LTG secondary to ELOS  Skilled Therapeutic Interventions/Progress Updates:    Pt asleep in bed upon arrival but easily aroused.  Pt sat EOB with supervision and stated that he didn't rest well last night. Pt SOB seated EOB. O2 sats 94%. Pt requested to use bathroom and amb with RW to bathroom CGA. Pt completed toileting tasks with CGA.  Pt unable to void. Pt returned to recliner to rest.  Pt SOB with O2 sats 93%. Pt declined shower and eating breakfast this morning but did drink some of his coke and eat one of his snack bars. Pt apologetic throughout session because he states "I just can't do it this morning." Pt did agree to changing clothing.  Pt donned shirt with supervision and required assistance pulling pants over hips.  Pt SOB throughout session with O2 sats>90%. Pt requested to be put on O2. Pt on 2L O2, resting in recliner with belt alarm activated and all needs within reach.   Therapy Documentation Precautions:  Precautions Precautions: Cervical Precaution Booklet Issued: Yes (comment) Precaution Comments: verbal discussion of precautions Required Braces or Orthoses: Cervical Brace Cervical Brace: Hard collar Restrictions Weight Bearing Restrictions: No Pain:  Pt with no c/o pain this morning   Therapy/Group: Individual Therapy  [name] 10/13/2019, 7:56 AM

## 2019-10-13 NOTE — Progress Notes (Signed)
[place] PHYSICAL MEDICINE & REHABILITATION PROGRESS NOTE   Subjective/Complaints:   Pt reports when woken out of sleep, gets very SOB, esp since "startled out of sleep" in AM-  Also had to void 3x overnight- using own CPAP now- still SOB even after 2 neb treatments yesterday, felt SOB. Sats >90%  Said cannot lay down at all- feels like will die/strangle to death if lays down.  Also doesn't know how, but LUE much more swollen this AM.   ROS- pt denied  CP, HA, N/V/D; abd pain/fullness . Denies SOB when up; only when laying down Objective:   No results found. No results for input(s): WBC, HGB, HCT, PLT in the last 72 hours. No results for input(s): NA, K, CL, CO2, GLUCOSE, BUN, CREATININE, CALCIUM in the last 72 hours.  Intake/Output Summary (Last 24 hours) at 10/13/2019 1004 Last data filed at 10/13/2019 0936 Gross per 24 hour  Intake 720 ml  Output --  Net 720 ml     Physical Exam: Vital Signs Blood pressure (!) 156/78, pulse 87, temperature 97.9 F (36.6 C), resp. rate 18, height 5\' 6"  (1.676 m), weight 102.1 kg, SpO2 94 %.  Constitutional:  Obese, barrel chested man sitting in bedside chair; bright cheeks; on O2 but not CPAP; bright cheeks again, nurse in room, C/o SOB still since walked to chair, NAD HENT:  Head: Atraumatic.  Mouth/Throat: Oropharynx is clear  Eyes:  EOM are normal.  Neck:  Incision on anterior neck-, but appears to be glued- a lot of bruising and ecchymosis which is resolving Cardiovascular:irregular rhythm, rate in 90s-again Respiratory: CTA B/L- no W/R/R great air movement B/L-no coarse breath sounds either GI: soft, NT, ND, (+)BS  Musculoskeletal:  Comments: RUE bicep 4+/5, WE 4+/5, triceps 4+/5, grip 4+/5, and finger abd 4+/5 LUE- biceps 3+/5, WE 3+/5, triceps 3/5, grip 3/5, finger abd 3-/5 RLE- HF 5-/5, KE 4+/5, DF 5-/5, PF 5-/5, EHL 4+/5 LLE- HF 4-/5, KE 4/5, DF 4-/5, PF 4/5, EHL 4-/5  Neurological: . Oriented x3. Hoffman's B/L in UEs- no  clonus B/L Decreased sensation to light touch L C5-T1 otherwise intact to light touch in all 3 other extremities and torso B/L Skin:  Surgical site neck hematoma resolving. Noted ecchymosis at site. LUE esp hand and forearm very swollen- pitting edema on dorsum of hand 2-3+; no swelling of RUE and LEs 1+ with TEDs in place B/L to ankles Psychiatric:  Slightly depressed affect about world issues   Assessment/Plan: 1. 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  Physiatrist is providing close team supervision and 24 hour management of active medical problems listed below.  Physiatrist and rehab team continue to assess barriers to discharge/monitor patient progress toward functional and medical goals  Care Tool:  Bathing    Body parts bathed by patient: Right arm, Left arm, Chest, Abdomen, Front perineal area, Right upper leg, Left upper leg, Right lower leg, Left lower leg, Face   Body parts bathed by helper: Buttocks     Bathing assist Assist Level: Minimal Assistance - Patient > 75%     Upper Body Dressing/Undressing Upper body dressing   What is the patient wearing?: Pull over shirt    Upper body assist Assist Level: Moderate Assistance - Patient 50 - 74%    Lower Body Dressing/Undressing Lower body dressing      What is the patient wearing?: Pants  Lower body assist Assist for lower body dressing: Moderate Assistance - Patient 50 - 74%     Toileting Toileting    Toileting assist Assist for toileting: Contact Guard/Touching assist     Transfers Chair/bed transfer  Transfers assist     Chair/bed transfer assist level: Supervision/Verbal cueing Chair/bed transfer assistive device: Geologist, engineering   Ambulation assist      Assist level: Supervision/Verbal cueing Assistive device: Walker-rolling Max distance: 125'    Walk 10 feet activity   Assist     Assist level: Supervision/Verbal cueing Assistive device: Walker-rolling   Walk 50 feet activity   Assist    Assist level: Supervision/Verbal cueing Assistive device: Walker-rolling    Walk 150 feet activity   Assist Walk 150 feet activity did not occur: Safety/medical concerns  Assist level: Supervision/Verbal cueing Assistive device: Walker-rolling    Walk 10 feet on uneven surface  activity   Assist Walk 10 feet on uneven surfaces activity did not occur: Safety/medical concerns         Wheelchair     Assist Will patient use wheelchair at discharge?: No             Wheelchair 50 feet with 2 turns activity    Assist            Wheelchair 150 feet activity     Assist          Blood pressure (!) 156/78, pulse 87, temperature 97.9 F (36.6 C), resp. rate 18, height 5\' 6"  (1.676 m), weight 102.1 kg, SpO2 94 %. Problem List and Plan:  1. 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 Soft Cervical collar when OOB  -patient may shower if cover cervical incision  -ELOS/Goals: 16-20 days/Goals Supervision- CGA   1/5- will change to 15/7 2. Antithrombotics:  -DVT/anticoagulation: Resume Eliquis 5 mg twice daily as prior to admission; off heparin gtt  -antiplatelet therapy: [name]  3. Pain Management: Flexeril and oxycodone as needed  4. Mood: Zoloft 50 mg daily, Klonopin 0.25 mg twice daily- will check if was changed from dose at home;   12/31- changed to Ativan QID 0.5 mg- home dose -antipsychotic agents: [name]  5. Neuropsych: This patient is capable of making decisions on his own behalf.  6. Skin/Wound Care: Routine skin checks  7. Fluids/Electrolytes/Nutrition: Routine in and outs with follow-up chemistries  8. History  of atrial fibrillation. Patient transitioned from intravenous heparin back to chronic Eliquis. Cardiac rate controlled. Continue Cardizem 100 mg daily, Lopressor 12.5 mg twice daily   1/2- rate on low side this AM- will check EKG after d/w Cardiology. Will determine plan once done.  1/3- EKG looks good- will con't regimen 9. Hypertension. Aldactone 25 mg daily, hydralazine 75 mg 3 times daily. Monitor with increased mobility   1/5: labile, continue to monitor- is chronically labile per pt. 10. History of CAD with CABG 2004. No complaints of increasing chest pain.  11. Leukocytosis. Felt to be steroid induced. Prednisone taper completed. Follow-up CBC   12/30- down to 21.7 form 28k  1/2- WBC down to 16.1k- doing great  1/4- WBC down to 14.1k 12. Diastolic congestive heart failure. Monitor for any signs of fluid overload   12/30- no weights yet  1/1- didn't have daily weights- changed order   Filed Weights   10/10/19 0439 10/12/19 0502 10/13/19 0500  Weight: 106.1  kg 102.7 kg 102.1 kg   1/3- weight stable  1/5- Weight going up - up 4 kg since this past Friday- pt was on Lasix 20 mg prn at home- feels SOB when lays down- c/w fluid overload although Resp exam wasn't- will give Lasix 40 mg x1- know has QT prolongation, but was on at home and tolerated.  1/6- no weight for today- but voided a lot yesterday  1/7- weight down to 102.7kg  13. OSA. Currently on BiPAP.   12/30- back on BiPAP last night- will ask reps therapy to address loose fitting mask.  1/1- doing BiPAP setting here- will speak with reps therapy plan.  1/4: will ask RT to evaluate machine as he wakes with feeling of SOB at night. If machine is working well, symptoms could be related to anxiety more than respiratory etiology and may benefit from prn Ativan at night.  1/8- will check CTA of chest as well as LUE Doppler for DVT- hopefully pt can do study since hasn't been able to lay down-  Will order Ativan 2 mg on call for CTA 14.  Hyperlipidemia. Lipitor  15. BPH and neurogenic bladder. Flomax 0.8 mg daily. Check PVRs for 3 days and cath if necessary  16. Constipation/Neurogenic bowel. Laxative assistance; will clean out and see if has control of stool and if not, will put on bowel program.   12/31- will order Mg citrate and then 3-4 hours later, an enema to clear him out. KUB wasn't real helpful, but had more stool when assessed it myself than discussed.  1/1- cleaned out- 2 very large BMs- feeling better 17. GERD. Protonix  18. Anxiety- Will make sure on home doses of Benzo.  12/31- changed to home dose Ativan QID 0.5 mg 19. Respiratory insufficiency  12/30- on 5-6L high flow O2- will wean as tolerated  - will order continuous pulse ox for now  1/1- off O2 this AM while in gym.  1/2- was off for PT this AM- asking to go back on this AM- will get EKG per Cards  1/4- EKG looked good-  20. Azotemia  12/30- BUN is 32 down from 40- will monitor; Cr 1.03 down from 1.32  1/5- Cr 1.16 and BUN 18- doing better  1/1- Labs in AM 21. Likely AD episode today  12/31- paradoxical breathing, SOB, nasal congestion, anxiety- didn't c/o HA- likely due to constipation -resolved episode 22. Dispo- 1/8- pt said was tomorrow, but d/c scheduled for Tuesday.   LOS: 10 days A FACE TO FACE EVALUATION WAS PERFORMED  Domenique Southers 10/13/2019, 10:04 AM

## 2019-10-14 ENCOUNTER — Inpatient Hospital Stay (HOSPITAL_COMMUNITY): Payer: Medicare Other | Admitting: Physical Therapy

## 2019-10-14 NOTE — Progress Notes (Addendum)
[place] PHYSICAL MEDICINE & REHABILITATION PROGRESS NOTE   Subjective/Complaints:   Denies any chest pain he still gets anxious about his breathing.  Appreciate pulmonary consult, reviewed CT angio of the chest   ROS- pt denied  CP, HA, N/V/D; abd pain/fullness . Denies SOB when up; only when laying down Objective:   CT ANGIO CHEST PE W OR WO CONTRAST  Addendum Date: 10/13/2019   ADDENDUM REPORT: 10/13/2019 13:29 ADDENDUM: Given persistent low lung volumes and the limited motion that was seen on the sniff test that was performed on 10/02/2019 profound weakness if not paralysis of the left and right hemidiaphragm could be considered. Diagnosis is easily confounded on fluoroscopy due to the bilateral nature of the process. Repeat sniff test or EMG may be helpful if warranted. These results were called by telephone at the time of interpretation on 10/13/2019 at 1:29 pm to provider Mariam Dollar , who verbally acknowledged these results. Electronically Signed   By: Donzetta Kohut M.D.   On: 10/13/2019 13:29   Result Date: 10/13/2019 CLINICAL DATA:  Shortness of breath pulmonary edema EXAM: CT ANGIOGRAPHY CHEST WITH CONTRAST TECHNIQUE: Multidetector CT imaging of the chest was performed using the standard protocol during bolus administration of intravenous contrast. Multiplanar CT image reconstructions and MIPs were obtained to evaluate the vascular anatomy. CONTRAST:  OMNIPAQUE IOHEXOL 350 MG/ML SOLN COMPARISON:  Cardiac evaluation from 09/30/2018 FINDINGS: Cardiovascular: Heart size is enlarged, mildly without signs of pericardial effusion. Scattered aortic atherosclerosis with normal caliber. Signs of CABG as before. No sign of acute pulmonary embolism. Mediastinum/Nodes: Postoperative changes related to median sternotomy. Esophagus is normal. Thoracic inlet structures with mildly heterogeneous thyroid, no signs of thoracic inlet or axillary lymphadenopathy. Lungs/Pleura: Volume loss present of  bilateral lower chest left greater than right. No signs of pleural effusion. Airways are patent. Left and right hemidiaphragm remain elevated. Upper Abdomen: Lobular liver contours. No acute findings in the upper abdomen. Musculoskeletal: Signs of median sternotomy for CABG. No evidence of acute bone finding or destructive bone process. Partial visualization of cervical spinal fusion. Mild stranding in the anterior neck greater on the left than the right. Review of the MIP images confirms the above findings. IMPRESSION: 1. Negative for acute pulmonary embolism. 2. Volume loss of bilateral lower chest, left greater than right, with elevation of the left and right hemidiaphragm. 3. Cardiomegaly without signs of pericardial effusion. 4. Mild stranding in the anterior neck greater on the left than the right, may represent edema or inflammation, correlate with physical exam and further imaging of the neck as warranted. These are partially imaged on today's exam. Aortic Atherosclerosis (ICD10-I70.0). Electronically Signed: By: Donzetta Kohut M.D. On: 10/13/2019 11:14   VAS [place] UPPER EXTREMITY VENOUS DUPLEX  Result Date: 10/13/2019 UPPER VENOUS STUDY  Indications: Edema, and Left hand and forearm swelling. Patient has a healing sore on top of left hand. Other Indications: Patient has been experiencing SOB x 4 months. History of CHF. Anticoagulation: Eliquis. Comparison Study: None Performing Technologist: Alecia Mackin : RVT, RDCS (AE), RDMS  Examination Guidelines: A complete evaluation includes B-mode imaging, spectral Doppler, color Doppler, and power Doppler as needed of all accessible portions of each vessel. Bilateral testing is considered an integral part of a complete examination. Limited examinations for reoccurring indications may be performed as noted.  Right Findings: +-----+------------+---------+-----------+----------+-------+ RIGHTCompressiblePhasicitySpontaneousPropertiesSummary  +-----+------------+---------+-----------+----------+-------+ IJV      Full       Yes       Yes                      +-----+------------+---------+-----------+----------+-------+  Left Findings: +----------+------------+---------+-----------+----------+-------+ LEFT      CompressiblePhasicitySpontaneousPropertiesSummary +----------+------------+---------+-----------+----------+-------+ IJV           Full       Yes       Yes                      +----------+------------+---------+-----------+----------+-------+ Subclavian    Full       Yes       Yes                      +----------+------------+---------+-----------+----------+-------+ Axillary      Full       Yes       Yes                      +----------+------------+---------+-----------+----------+-------+ Brachial      Full       Yes       Yes                      +----------+------------+---------+-----------+----------+-------+ Radial        Full       Yes       Yes                      +----------+------------+---------+-----------+----------+-------+ Ulnar         Full       Yes       Yes                      +----------+------------+---------+-----------+----------+-------+ Cephalic      Full       Yes       Yes                      +----------+------------+---------+-----------+----------+-------+ Basilic       Full       Yes       Yes                      +----------+------------+---------+-----------+----------+-------+  Summary:  Right: Normal right IJV.  Left: No evidence of deep vein thrombosis in the upper extremity. No evidence of superficial vein thrombosis in the upper extremity. There is superficial edema seen in the mid and distal forearm along with top of hand.  *See table(s) above for measurements and observations.    Preliminary    No results for input(s): WBC, HGB, HCT, PLT in the last 72 hours. No results for input(s): NA, K, CL, CO2, GLUCOSE, BUN, CREATININE, CALCIUM  in the last 72 hours.  Intake/Output Summary (Last 24 hours) at 10/14/2019 1327 Last data filed at 10/14/2019 0700 Gross per 24 hour  Intake --  Output 550 ml  Net -550 ml     Physical Exam: Vital Signs Blood pressure 133/79, pulse 90, temperature 98 F (36.7 C), temperature source Axillary, resp. rate 18, height 5\' 6"  (1.676 m), weight 102.1 kg, SpO2 96 %.  Constitutional:  Obese, barrel chested man sitting in bedside chair; bright cheeks; on O2 but not CPAP; bright cheeks again, nurse in room, C/o SOB still since walked to chair, NAD HENT:  Head: Atraumatic.  Mouth/Throat: Oropharynx is clear  Eyes:  EOM are normal.  Neck:  Incision on anterior neck-, but appears to be glued- a lot of bruising and ecchymosis which is resolving Cardiovascular:irregular rhythm, rate in 90s-again Respiratory: CTA B/L- no W/R/R great air  movement B/L-no coarse breath sounds either GI: soft, NT, ND, (+)BS  Musculoskeletal:  Comments: RUE bicep 4+/5, WE 4+/5, triceps 4+/5, grip 4+/5, and finger abd 4+/5, deltoid 3 - LUE- biceps 3+/5, WE 3+/5, triceps 3/5, grip 3/5, finger abd 3-/5, deltoid to minus RLE- HF 5-/5, KE 4+/5, DF 5-/5, PF 5-/5, EHL 4+/5 LLE- HF 4-/5, KE 4/5, DF 4-/5, PF 4/5, EHL 4-/5  Neurological: . Oriented x3. Hoffman's B/L in UEs- no clonus B/L Decreased sensation to light touch L C5-T1 otherwise intact to light touch in all 3 other extremities and torso B/L Skin:  Surgical site neck hematoma resolving. Noted ecchymosis at site. LUE esp hand and forearm very swollen- pitting edema on dorsum of hand 2-3+; no swelling of RUE and LEs 1+ with TEDs in place B/L to ankles Psychiatric:  Slightly depressed affect about world issues   Assessment/Plan: 1. 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  Physiatrist is providing close team supervision and 24 hour management of  active medical problems listed below.  Physiatrist and rehab team continue to assess barriers to discharge/monitor patient progress toward functional and medical goals  Care Tool:  Bathing    Body parts bathed by patient: Right arm, Left arm, Chest, Abdomen, Front perineal area, Right upper leg, Left upper leg, Right lower leg, Left lower leg, Face   Body parts bathed by helper: Buttocks     Bathing assist Assist Level: Minimal Assistance - Patient > 75%     Upper Body Dressing/Undressing Upper body dressing   What is the patient wearing?: Pull over shirt    Upper body assist Assist Level: Moderate Assistance - Patient 50 - 74%    Lower Body Dressing/Undressing Lower body dressing      What is the patient wearing?: Pants     Lower body assist Assist for lower body dressing: Moderate Assistance - Patient 50 - 74%     Toileting Toileting    Toileting assist Assist for toileting: Contact Guard/Touching assist     Transfers Chair/bed transfer  Transfers assist     Chair/bed transfer assist level: Supervision/Verbal cueing Chair/bed transfer assistive device: Geologist, engineering   Ambulation assist      Assist level: Supervision/Verbal cueing Assistive device: Walker-rolling Max distance: 125'   Walk 10 feet activity   Assist     Assist level: Supervision/Verbal cueing Assistive device: Walker-rolling   Walk 50 feet activity   Assist    Assist level: Supervision/Verbal cueing Assistive device: Walker-rolling    Walk 150 feet activity   Assist Walk 150 feet activity did not occur: Safety/medical concerns  Assist level: Supervision/Verbal cueing Assistive device: Walker-rolling    Walk 10 feet on uneven surface  activity   Assist Walk 10 feet on uneven surfaces activity did not occur: Safety/medical concerns         Wheelchair     Assist Will patient use wheelchair at discharge?: No             Wheelchair  50 feet with 2 turns activity    Assist            Wheelchair 150 feet activity     Assist          Blood pressure 133/79, pulse 90, temperature 98 F (36.7 C), temperature source Axillary, resp. rate 18, height 5\' 6"  (1.676 m), weight 102.1 kg, SpO2 96 %. Problem List  and Plan:  1. 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 Soft Cervical collar when OOB  -patient may shower if cover cervical incision  -ELOS/Goals: 16-20 days/Goals Supervision- CGA   1/5- will change to 15/7 CIR PT and OT 2. Antithrombotics:  -DVT/anticoagulation: Resume Eliquis 5 mg twice daily as prior to admission; off heparin gtt  -antiplatelet therapy: [name]  3. Pain Management: Flexeril and oxycodone as needed  4. Mood: Zoloft 50 mg daily, Klonopin 0.25 mg twice daily- will check if was changed from dose at home;   12/31- changed to Ativan QID 0.5 mg- home dose -antipsychotic agents: [name]  5. Neuropsych: This patient is capable of making decisions on his own behalf.  6. Skin/Wound Care: Routine skin checks  7. Fluids/Electrolytes/Nutrition: Routine in and outs with follow-up chemistries  8. History of atrial fibrillation. Patient transitioned from intravenous heparin back to chronic Eliquis. Cardiac rate controlled. Continue Cardizem 100 mg daily, Lopressor 12.5 mg twice daily   1/2- rate on low side this AM- will check EKG after d/w Cardiology. Will determine plan once done.  1/3- EKG looks good- will con't regimen 9. Hypertension. Aldactone 25 mg daily, hydralazine 75 mg 3 times daily. Monitor with increased mobility   1/5: labile, continue to monitor- is chronically labile per pt. 10. History of CAD with CABG 2004. No complaints of increasing chest pain.  11. Leukocytosis. Felt to be steroid induced. Prednisone  taper completed. Follow-up CBC   12/30- down to 21.7 form 28k  1/2- WBC down to 16.1k- doing great  1/4- WBC down to 14.1k 12. Diastolic congestive heart failure. Monitor for any signs of fluid overload   12/30- no weights yet  1/1- didn't have daily weights- changed order   Filed Weights   10/10/19 0439 10/12/19 0502 10/13/19 0500  Weight: 106.1 kg 102.7 kg 102.1 kg   1/3- weight stable  1/5- Weight going up - up 4 kg since this past Friday- pt was on Lasix 20 mg prn at home- feels SOB when lays down- c/w fluid overload although Resp exam wasn't- will give Lasix 40 mg x1- know has QT prolongation, but was on at home and tolerated.  1/6- no weight for today- but voided a lot yesterday  1/7- weight down to 102.7kg  13. OSA. Currently on BiPAP.   12/30- back on BiPAP last night- will ask reps therapy to address loose fitting mask.  1/1- doing BiPAP setting here- will speak with reps therapy plan.  1/4: will ask RT to evaluate machine as he wakes with feeling of SOB at night. If machine is working well, symptoms could be related to anxiety more than respiratory etiology and may benefit from prn Ativan at night.  1/8- will check CTA of chest as well as LUE Doppler for DVT- hopefully pt can do study since hasn't been able to lay down-  Will order Ativan 2 mg on call for CTA 14. Hyperlipidemia. Lipitor  15. BPH and neurogenic bladder. Flomax 0.8 mg daily. Check PVRs for 3 days and cath if necessary  16. Constipation/Neurogenic bowel. Laxative assistance; will clean out and see if has control of stool and if not, will put on bowel program.   12/31- will order Mg citrate and then 3-4 hours later, an enema to clear him out. KUB wasn't real helpful, but had more stool when assessed it myself than discussed.  1/1-  cleaned out- 2 very large BMs- feeling better 17. GERD. Protonix  18. Anxiety- Will make sure on home doses of Benzo.  12/31- changed to home dose Ativan QID 0.5 mg 19. Respiratory  insufficiency, history of non-vent dependent respiratory failure, recently switched from BiPAP to CPAP  12/30- on 5-6L high flow O2- will wean as tolerated  - will order continuous pulse ox for now  1/1- off O2 this AM while in gym.  1/2- was off for PT this AM- asking to go back on this AM- will get EKG per Cards  1/4- EKG looked good-  20. Azotemia  12/30- BUN is 32 down from 40- will monitor; Cr 1.03 down from 1.32  1/5- Cr 1.16 and BUN 18- doing better  1/1- Labs in AM 21. Likely AD episode today  12/31- paradoxical breathing, SOB, nasal congestion, anxiety- didn't c/o HA- likely due to constipation -resolved episode 22. Dispo- 1/8- pt said was tomorrow, but d/c scheduled for Tuesday.   LOS: 11 days A FACE TO FACE EVALUATION WAS PERFORMED  Erick Colace 10/14/2019, 1:27 PM

## 2019-10-14 NOTE — Progress Notes (Signed)
NIF > -40 x 3 attempts  VC 1.0L x 3 attempts with good effort.

## 2019-10-14 NOTE — Progress Notes (Signed)
Physical Therapy Session Note  Patient Details  Name: [name] [name] MRN: [id number] Date of Birth: 1951/10/04  Today's Date: 10/14/2019 PT Individual Time: 9692-4932 PT Individual Time Calculation (min): 40 min   Short Term Goals: Week 2:  PT Short Term Goal 1 (Week 2): =LTG due to ELOS  Skilled Therapeutic Interventions/Progress Updates:   Pt received sitting in WC and agreeable to PT.  PT instructed pt in dynamic balance training while engaged in Wii sports bowling fine motor task. Pt demonstrates improve fine motor control in the RUE with remote management throughout game with only minor difficulty managing controls. Pt able to remain standing up to 10 minutes before requiring prolonged seated rest break. SpO2 remained greater than 94%. Gait training with RW x 176f with supervision assist and min cues for safety and AD management in turns to maintain wide BOS.Patient returned to room and left sitting in recliner with call bell in reach and all needs met.        Therapy Documentation Precautions:  Precautions Precautions: Cervical Precaution Booklet Issued: Yes (comment) Precaution Comments: verbal discussion of precautions Required Braces or Orthoses: Cervical Brace Cervical Brace: Hard collar Restrictions Weight Bearing Restrictions: No    Vital Signs: Therapy Vitals Temp: 97.8 F (36.6 C) Temp Source: Oral Pulse Rate: 81 BP: (!) 145/69 Patient Position (if appropriate): Sitting Oxygen Therapy SpO2: 94 % O2 Device: Room Air Pain:   denies  Therapy/Group: Individual Therapy  ALorie Phenix1/06/2020, 5:50 PM

## 2019-10-14 NOTE — Progress Notes (Signed)
Physical Therapy Session Note  Patient Details  Name: [name] [name] MRN: [id number] Date of Birth: 06/15/1951  Today's Date: 10/14/2019 PT Individual Time: 0935-1005 PT Individual Time Calculation (min): 30 min   Short Term Goals: Week 1:  PT Short Term Goal 1 (Week 1): Pt will ambulate x 100 ft with LRAD and min A PT Short Term Goal 1 - Progress (Week 1): Met PT Short Term Goal 2 (Week 1): Pt will navigate 4 stairs with 1 handrail and min A PT Short Term Goal 2 - Progress (Week 1): Met PT Short Term Goal 3 (Week 1): Pt will perform bed mobility with Supervision PT Short Term Goal 3 - Progress (Week 1): Progressing toward goal Week 2:  PT Short Term Goal 1 (Week 2): =LTG due to ELOS  Skilled Therapeutic Interventions/Progress Updates:   Pt received sitting in WC and agreeable to PT. PT treatment focused on LE strengthening with seated therex using level 2 tband. LAQ, HS curl, calf raises, hip abduction. Reciprocal hip flexion. All completed x 15 BLE. Cues for decreased speed and full ROM. Rest breaks between each bout due to fatigue. Pt reports need for urination. Ambulatory transfer to WC with RW and supervision assist from PT for safety and RW.  Pt left sitting on toilet with wife present in room and RN aware of pt position.        Therapy Documentation Precautions:  Precautions Precautions: Cervical Precaution Booklet Issued: Yes (comment) Precaution Comments: verbal discussion of precautions Required Braces or Orthoses: Cervical Brace Cervical Brace: Hard collar Restrictions Weight Bearing Restrictions: No Vital Signs: Therapy Vitals Temp: 98 F (36.7 C) Temp Source: Axillary Pulse Rate: 90 Resp: 18 BP: 133/79 Patient Position (if appropriate): Lying Oxygen Therapy SpO2: 96 % O2 Device: CPAP Pain: Pain Assessment Pain Scale: 0-10 Pain Score: 0-No pain    Therapy/Group: Individual Therapy  Austin E Tucker 10/14/2019, 10:07 AM  

## 2019-10-14 NOTE — Progress Notes (Addendum)
NIF was more negative than -40 and VC was 700 ml.  Good technique used.

## 2019-10-15 ENCOUNTER — Inpatient Hospital Stay (HOSPITAL_COMMUNITY): Payer: Medicare Other

## 2019-10-15 NOTE — Progress Notes (Signed)
[facility] PHYSICAL MEDICINE & REHABILITATION PROGRESS NOTE   Subjective/Complaints:   Still with some anxiety related to his breathing.  Consult, reviewed CT angio of the chest   ROS- pt denied  CP, HA, N/V/D; abd pain/fullness . Denies SOB when up; only when laying down Objective:   No results found. No results for input(s): WBC, HGB, HCT, PLT in the last 72 hours. No results for input(s): NA, K, CL, CO2, GLUCOSE, BUN, CREATININE, CALCIUM in the last 72 hours.  Intake/Output Summary (Last 24 hours) at 10/15/2019 1453 Last data filed at 10/15/2019 1305 Gross per 24 hour  Intake 701 ml  Output --  Net 701 ml     Physical Exam: Vital Signs Blood pressure 134/71, pulse 74, temperature 99 F (37.2 C), resp. rate 17, height 5\' 6"  (1.676 m), weight 105.4 kg, SpO2 95 %.  Constitutional:  Obese, barrel chested man sitting in bedside chair; bright cheeks; on O2 but not CPAP; bright cheeks again, nurse in room, C/o SOB still since walked to chair, NAD HENT:  Head: Atraumatic.  Mouth/Throat: Oropharynx is clear  Eyes:  EOM are normal.  Neck:  Incision on anterior neck-, but appears to be glued- a lot of bruising and ecchymosis which is resolving Cardiovascular:irregular rhythm, rate in 90s-again Respiratory: CTA B/L-decreased breath sounds bilateral bases,  GI: soft, NT, ND, (+)BS  Musculoskeletal:  Comments: RUE bicep 4+/5, WE 4+/5, triceps 4+/5, grip 4+/5, and finger abd 4+/5, deltoid 3 - LUE- biceps 3+/5, WE 3+/5, triceps 3/5, grip 3/5, finger abd 3-/5, deltoid to minus RLE- HF 5-/5, KE 4+/5, DF 5-/5, PF 5-/5, EHL 4+/5 LLE- HF 4-/5, KE 4/5, DF 4-/5, PF 4/5, EHL 4-/5, appears unchanged Neurological: . Oriented x3. Hoffman's B/L in UEs- no clonus B/L Decreased sensation to light touch L C5-T1 otherwise intact to light touch in all 3 other extremities and torso B/L Skin:  Surgical site neck hematoma resolving. Noted ecchymosis at site. LUE esp hand and forearm very swollen-  pitting edema on dorsum of hand 2-3+; no swelling of RUE and LEs 1+ with TEDs in place B/L to ankles Psychiatric:  Slightly depressed affect about world issues   Assessment/Plan: 1. 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  Physiatrist is providing close team supervision and 24 hour management of active medical problems listed below.  Physiatrist and rehab team continue to assess barriers to discharge/monitor patient progress toward functional and medical goals  Care Tool:  Bathing    Body parts bathed by patient: Right arm, Left arm, Chest, Abdomen, Front perineal area, Right upper leg, Left upper leg, Right lower leg, Left lower leg, Face, Buttocks   Body parts bathed by helper: Buttocks     Bathing assist Assist Level: Contact Guard/Touching assist     Upper Body Dressing/Undressing Upper body dressing   What is the patient wearing?: Pull over shirt    Upper body assist Assist Level: Moderate Assistance - Patient 50 - 74%    Lower Body Dressing/Undressing Lower body dressing      What is the patient wearing?: Pants     Lower body assist Assist for lower body dressing: Moderate Assistance - Patient 50 - 74%     Toileting Toileting    Toileting assist Assist for toileting: Contact Guard/Touching assist     Transfers Chair/bed transfer  Transfers assist     Chair/bed transfer assist level: Supervision/Verbal cueing Chair/bed transfer  assistive device: Museum/gallery exhibitions officer assist      Assist level: Supervision/Verbal cueing Assistive device: Walker-rolling Max distance: 125'   Walk 10 feet activity   Assist     Assist level: Supervision/Verbal cueing Assistive device: Walker-rolling   Walk 50 feet activity   Assist    Assist level: Supervision/Verbal cueing Assistive device: Walker-rolling     Walk 150 feet activity   Assist Walk 150 feet activity did not occur: Safety/medical concerns  Assist level: Supervision/Verbal cueing Assistive device: Walker-rolling    Walk 10 feet on uneven surface  activity   Assist Walk 10 feet on uneven surfaces activity did not occur: Safety/medical concerns         Wheelchair     Assist Will patient use wheelchair at discharge?: No             Wheelchair 50 feet with 2 turns activity    Assist            Wheelchair 150 feet activity     Assist          Blood pressure 134/71, pulse 74, temperature 99 F (37.2 C), resp. rate 17, height 5\' 6"  (1.676 m), weight 105.4 kg, SpO2 95 %. Problem List and Plan:  1. 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 Soft Cervical collar when OOB  Continue CIR PT OT 15/7 schedule -patient may shower if cover cervical incision  -ELOS/Goals: 16-20 days/Goals Supervision- CGA    2. Antithrombotics:  -DVT/anticoagulation: Resume Eliquis 5 mg twice daily as prior to admission; off heparin gtt  -antiplatelet therapy: [name]  3. Pain Management: Flexeril and oxycodone as needed  4. Mood: Zoloft 50 mg daily, Klonopin 0.25 mg twice daily- will check if was changed from dose at home;   12/31- changed to Ativan QID 0.5 mg- home dose -antipsychotic agents: [name]  5. Neuropsych: This patient is capable of making decisions on his own behalf.  6. Skin/Wound Care: Routine skin checks  7. Fluids/Electrolytes/Nutrition: Routine in and outs with follow-up chemistries  8. History of atrial fibrillation. Patient transitioned from intravenous heparin back to chronic Eliquis. Cardiac rate controlled. Continue Cardizem 100 mg daily, Lopressor 12.5 mg twice daily   1/2- rate on low side this AM- will check EKG  after d/w Cardiology. Will determine plan once done.  1/3- EKG looks good- will con't regimen 9. Hypertension. Aldactone 25 mg daily, hydralazine 75 mg 3 times daily. Monitor with increased mobility   Controlled, 1/10 Vitals:   10/14/19 2238 10/15/19 0455  BP:  134/71  Pulse: 70 74  Resp:  17  Temp:  99 F (37.2 C)  SpO2: 94% 95%   10. History of CAD with CABG 2004. No complaints of increasing chest pain.  11. Leukocytosis. Felt to be steroid induced. Prednisone taper completed. Follow-up CBC   12/30- down to 21.7 form 28k  1/2- WBC down to 16.1k- doing great  1/4- WBC down to 14.1k 12. Diastolic congestive heart failure. Monitor for any signs of fluid overload   12/30- no weights yet  1/1- didn't have daily weights- changed order   Filed Weights   10/13/19 0500 10/14/19 2134 10/15/19 0455  Weight: 102.1 kg 102.4 kg 105.4 kg   1/3- weight stable  1/5- Weight going up - up 4 kg since this past Friday- pt was on Lasix 20 mg  prn at home- feels SOB when lays down- c/w fluid overload although Resp exam wasn't- will give Lasix 40 mg x1- know has QT prolongation, but was on at home and tolerated.  1/6- no weight for today- but voided a lot yesterday  1/7- weight down to 102.7kg  13. OSA. Currently on BiPAP.   12/30- back on BiPAP last night- will ask reps therapy to address loose fitting mask.  1/1- doing BiPAP setting here- will speak with reps therapy plan.  1/4: will ask RT to evaluate machine as he wakes with feeling of SOB at night. If machine is working well, symptoms could be related to anxiety more than respiratory etiology and may benefit from prn Ativan at night.  1/8- will check CTA of chest as well as LUE Doppler for DVT- hopefully pt can do study since hasn't been able to lay down-  Will order Ativan 2 mg on call for CTA 14. Hyperlipidemia. Lipitor  15. BPH and neurogenic bladder. Flomax 0.8 mg daily. Check PVRs for 3 days and cath if necessary  16. Constipation/Neurogenic  bowel. Laxative assistance; will clean out and see if has control of stool and if not, will put on bowel program.   12/31- will order Mg citrate and then 3-4 hours later, an enema to clear him out. KUB wasn't real helpful, but had more stool when assessed it myself than discussed.  1/1- cleaned out- 2 very large BMs- feeling better 17. GERD. Protonix  18. Anxiety- Will make sure on home doses of Benzo.  12/31- changed to home dose Ativan QID 0.5 mg 19. Respiratory insufficiency, history of non-vent dependent respiratory failure, recently switched from BiPAP to CPAP  12/30- on 5-6L high flow O2- will wean as tolerated  - will order continuous pulse ox for now  1/1- off O2 this AM while in gym.  1/2- was off for PT this AM- asking to go back on this AM- will get EKG per Cards  1/4- EKG looked good-  20. Azotemia  12/30- BUN is 32 down from 40- will monitor; Cr 1.03 down from 1.32  1/5- Cr 1.16 and BUN 18- doing better  1/1- Labs in AM 21. Likely AD episode today  12/31- paradoxical breathing, SOB, nasal congestion, anxiety- didn't c/o HA- likely due to constipation -resolved episode 22. Dispo- 1/8- pt said was tomorrow, but d/c scheduled for Tuesday.   LOS: 12 days A FACE TO FACE EVALUATION WAS PERFORMED  Erick Colace 10/15/2019, 2:53 PM

## 2019-10-15 NOTE — Progress Notes (Signed)
Occupational Therapy Session Note  Patient Details  Name: [name] [name] MRN: [id number] Date of Birth: 04/11/1951  Today's Date: 10/15/2019 OT Individual Time: 0800-0900 OT Individual Time Calculation (min): 60 min    Short Term Goals: Week 2:  OT Short Term Goal 1 (Week 2): STG=LTG secondary to ELOS  Skilled Therapeutic Interventions/Progress Updates:    Pt received in bathroom, standing up off toilet- pt did pull call light for assistance. Pt reminded of policy to remain seated until staff member present to stand. Pt completed toileting tasks with close (S). Pt completed functional mobility to the sink with RW- (S). Pt able to complete oral care with set up assist. SpO2 remaining  > 94% on RA. Pt took seated rest break and then completed functional mobility into the bathroom and transferred onto low toilet while water heated up in shower. Pt doffed all clothing in standing with edu provided re safety awareness and importance of removing socks and pants seated. Pt required min A to support R UE in shoulder flexion to reach hair to wash. Pt completed UB/LB bathing seated  With sit <> stand with grab bar support required to stand. Pt demonstrated good safety awareness and required no additional cueing. Pt requested seated rest break before returning to w/c with RW. Pt required mod A to don shirt with pt audibly SOB. SpO2 at 94%. Pt donned shorts with min A to pull up in standing. Pt reports he has made several pieces of AE himself at home to assist in donning socks, deodorant, etc. Pt required min cueing for UE placement during transfers. Pt was left sitting up in the recliner with RN present.   Therapy Documentation Precautions:  Precautions Precautions: Cervical Precaution Booklet Issued: Yes (comment) Precaution Comments: verbal discussion of precautions Required Braces or Orthoses: Cervical Brace Cervical Brace: Hard collar Restrictions Weight Bearing Restrictions: No   Therapy/Group:  Individual Therapy  Crissie Reese 10/15/2019, 7:21 AM

## 2019-10-15 NOTE — Progress Notes (Signed)
Occupational Therapy Session Note  Patient Details  Name: [name] [name] MRN: [id number] Date of Birth: April 05, 1951  Today's Date: 10/15/2019 OT Individual Time: 4098-1191 OT Individual Time Calculation (min): 42 min    Short Term Goals: Week 2:  OT Short Term Goal 1 (Week 2): STG=LTG secondary to ELOS  Skilled Therapeutic Interventions/Progress Updates:    Pt resting in recliner upon arrival with wife present.  Retrograde massage performed on L hand and wife educated on technique and purpose. Decreased edema noted after massage.  L shoulder AAROM/PROM-flexion and adduction.  Pt issued green foam for R hand.  Pt and wife educated on technique and purpose of foam for hand strengthening. Continued discharge planning including home safety recommendations and energy conservation strategies.  Pt and wife pleased with progress and looking forward to discharge on 1/12.  Therapy Documentation Precautions:  Precautions Precautions: Cervical Precaution Booklet Issued: Yes (comment) Precaution Comments: verbal discussion of precautions Required Braces or Orthoses: Cervical Brace Cervical Brace: Hard collar Restrictions Weight Bearing Restrictions: No   Pain:  Pt denies pain this afternoon   Therapy/Group: Individual Therapy  [name] 10/15/2019, 2:50 PM

## 2019-10-15 NOTE — Discharge Summary (Signed)
Physician Discharge Summary  Patient ID: [name] [name] MRN: [id number] DOB/AGE: [birth year] 69 y.o.  Admit date: 10/03/2019 Discharge date: 10/17/2019  Discharge Diagnoses:  Principal Problem:   Myelopathy concurrent with and due to spinal stenosis of cervical region [facility]) Active Problems:   Obstructive sleep apnea   Congestive heart failure (HCC)   BPH without urinary obstruction   Obesity   Anxiety state   Cervical myelopathy (HCC) History of atrial fibrillation Mood Hypertension History of CAD with CABG  Discharged Condition: Stable  Significant Diagnostic Studies: DG Neck Soft Tissue  Result Date: 09/30/2019 CLINICAL DATA:  Shortness of breath and neck swelling. EXAM: NECK SOFT TISSUES - 1+ VIEW COMPARISON:  09/23/2019 FINDINGS: Nasopharynx, oropharynx, pharynx and hypopharyngeal region are normal. Prevertebral soft tissues are normal. Epiglottis and subglottic airway are normal. Anterior fusion hardware intact unchanged over the cervical spine remainder the exam is unchanged. IMPRESSION: No acute findings. Electronically Signed   By: Elberta Fortis M.D.   On: 09/30/2019 10:44   DG Neck Soft Tissue  Result Date: 09/23/2019 CLINICAL DATA:  Neck swelling and difficulty breathing. Recent ACDF 2 days ago. EXAM: NECK SOFT TISSUES - 1+ VIEW COMPARISON:  Cervical spine x-rays dated September 21, 2019 FINDINGS: C3-C6 ACDF. Prominent prevertebral soft tissue swelling, increased when compared to intraoperative x-rays from 2 days ago. There is some mass effect on the hypopharynx. No epiglottic enlargement. IMPRESSION: Prominent prevertebral soft tissue swelling, increased when compared to intraoperative x-rays from 2 days ago. Electronically Signed   By: Obie Dredge M.D.   On: 09/23/2019 09:11   DG Cervical Spine 2-3 Views  Result Date: 09/21/2019 CLINICAL DATA:  C3-C6 ACDF. EXAM: CERVICAL SPINE - 2-3 VIEW COMPARISON:  MRI cervical spine dated September 02, 2019. FINDINGS: Two lateral  intraoperative x-rays submitted for review. Film #1 demonstrates surgical localizer at C3-C4. Film #2 demonstrates completion C3-C6 ACDF. IMPRESSION: Intraoperative localization for C3-C6 ACDF. Electronically Signed   By: Obie Dredge M.D.   On: 09/21/2019 14:17   DG Abd 1 View  Result Date: 10/05/2019 CLINICAL DATA:  Constipation EXAM: ABDOMEN - 1 VIEW COMPARISON:  None. FINDINGS: Scattered large and small bowel gas is noted. A few mildly prominent loops of small bowel are noted in the left mid abdomen although no true obstructive changes are seen. No abnormal mass or abnormal calcifications are noted. Degenerative changes of lumbar spine are seen. IMPRESSION: Mildly prominent loops of mid small bowel without definitive obstruction. Electronically Signed   By: Alcide Clever M.D.   On: 10/05/2019 10:18   CT ANGIO CHEST PE W OR WO CONTRAST  Addendum Date: 10/13/2019   ADDENDUM REPORT: 10/13/2019 13:29 ADDENDUM: Given persistent low lung volumes and the limited motion that was seen on the sniff test that was performed on 10/02/2019 profound weakness if not paralysis of the left and right hemidiaphragm could be considered. Diagnosis is easily confounded on fluoroscopy due to the bilateral nature of the process. Repeat sniff test or EMG may be helpful if warranted. These results were called by telephone at the time of interpretation on 10/13/2019 at 1:29 pm to provider Mariam Dollar , who verbally acknowledged these results. Electronically Signed   By: Donzetta Kohut M.D.   On: 10/13/2019 13:29   Result Date: 10/13/2019 CLINICAL DATA:  Shortness of breath pulmonary edema EXAM: CT ANGIOGRAPHY CHEST WITH CONTRAST TECHNIQUE: Multidetector CT imaging of the chest was performed using the standard protocol during bolus administration of intravenous contrast. Multiplanar CT image reconstructions and MIPs were  obtained to evaluate the vascular anatomy. CONTRAST:  OMNIPAQUE IOHEXOL 350 MG/ML SOLN COMPARISON:   Cardiac evaluation from 09/30/2018 FINDINGS: Cardiovascular: Heart size is enlarged, mildly without signs of pericardial effusion. Scattered aortic atherosclerosis with normal caliber. Signs of CABG as before. No sign of acute pulmonary embolism. Mediastinum/Nodes: Postoperative changes related to median sternotomy. Esophagus is normal. Thoracic inlet structures with mildly heterogeneous thyroid, no signs of thoracic inlet or axillary lymphadenopathy. Lungs/Pleura: Volume loss present of bilateral lower chest left greater than right. No signs of pleural effusion. Airways are patent. Left and right hemidiaphragm remain elevated. Upper Abdomen: Lobular liver contours. No acute findings in the upper abdomen. Musculoskeletal: Signs of median sternotomy for CABG. No evidence of acute bone finding or destructive bone process. Partial visualization of cervical spinal fusion. Mild stranding in the anterior neck greater on the left than the right. Review of the MIP images confirms the above findings. IMPRESSION: 1. Negative for acute pulmonary embolism. 2. Volume loss of bilateral lower chest, left greater than right, with elevation of the left and right hemidiaphragm. 3. Cardiomegaly without signs of pericardial effusion. 4. Mild stranding in the anterior neck greater on the left than the right, may represent edema or inflammation, correlate with physical exam and further imaging of the neck as warranted. These are partially imaged on today's exam. Aortic Atherosclerosis (ICD10-I70.0). Electronically Signed: By: Donzetta Kohut M.D. On: 10/13/2019 11:14   DG Sniff Test  Result Date: 10/02/2019 CLINICAL DATA:  Acute respiratory failure EXAM: CHEST FLUOROSCOPY TECHNIQUE: Real-time fluoroscopic evaluation of the chest was performed. FLUOROSCOPY TIME:  Fluoroscopy Time:  1 minutes Radiation Exposure Index (if provided by the fluoroscopic device): 3.7 mGy Number of Acquired Spot Images: 0 COMPARISON:  None other than chest  x-ray evaluations and previous CTs. FINDINGS: Limited motion of the left and right hemidiaphragm with maximal inspiratory effort. Lung volumes remain low. Changes of median sternotomy and CABG. IMPRESSION: Limited motion with low volume chest of the right and left hemidiaphragm. No signs of paradoxical motion to suggest diaphragmatic paralysis. Electronically Signed   By: Donzetta Kohut M.D.   On: 10/02/2019 13:53   DG CHEST PORT 1 VIEW  Result Date: 10/03/2019 CLINICAL DATA:  Acute respiratory failure. EXAM: PORTABLE CHEST 1 VIEW COMPARISON:  Radiograph 09/30/2019. FINDINGS: Persistent low lung volumes. Post median sternotomy. Bibasilar opacities with blunting of the costophrenic angles, unchanged from prior. No pulmonary edema or pneumothorax. Surgical hardware in the lower cervical spine is partially included. IMPRESSION: No significant change from prior exam with low lung volumes. Bibasilar opacities likely combination of atelectasis and small pleural effusions. Electronically Signed   By: Narda Rutherford M.D.   On: 10/03/2019 06:23   DG Chest Port 1 View  Result Date: 09/30/2019 CLINICAL DATA:  Acute respiratory failure with hypoxemia. EXAM: PORTABLE CHEST 1 VIEW COMPARISON:  09/29/2019 FINDINGS: Sternotomy wires unchanged. Lungs are hypoinflated with stable bibasilar opacification likely small effusions with associated basilar atelectasis. Cardiomediastinal silhouette and remainder of the exam is unchanged. IMPRESSION: Stable bibasilar opacification likely effusions with atelectasis. Electronically Signed   By: Elberta Fortis M.D.   On: 09/30/2019 09:20   DG Chest Port 1 View  Result Date: 09/29/2019 CLINICAL DATA:  Acute respiratory failure/hypoxia. EXAM: PORTABLE CHEST 1 VIEW COMPARISON:  09/26/2019 FINDINGS: Lungs are hypoinflated demonstrate slight worsening bibasilar opacification which may be due to effusions/atelectasis. Mild stable cardiomegaly. Remainder of the exam is unchanged.  IMPRESSION: Hypoinflation with slight worsening bibasilar opacification likely representing effusions with atelectasis. Electronically Signed  By: Marin Olp M.D.   On: 09/29/2019 08:44   DG Chest Port 1 View  Result Date: 09/26/2019 CLINICAL DATA:  Respiratory failure. EXAM: PORTABLE CHEST 1 VIEW COMPARISON:  09/25/2019. FINDINGS: Prior CABG. Stable cardiomegaly. Low lung volumes with persistent bibasilar atelectasis. Similar findings noted on prior exam. No prominent pleural effusion. No pneumothorax. Prior cervical spine fusion. Carotid vascular calcification. IMPRESSION: 1.  Prior CABG.  Stable cardiomegaly. 2. Low lung volumes with persistent bibasilar atelectasis. Similar findings noted on prior exam. 3.  Carotid vascular disease. Electronically Signed   By: Marcello Moores  Register   On: 09/26/2019 06:40   DG Chest Port 1 View  Result Date: 09/25/2019 CLINICAL DATA:  Acute respiratory failure. EXAM: PORTABLE CHEST 1 VIEW COMPARISON:  Radiograph 09/23/2019 FINDINGS: Low lung volumes limit assessment. Post median sternotomy. Cardiomegaly. Elevated right hemidiaphragm versus volume loss at the right lung base. Streaky left lung base atelectasis. Improved vascular congestion. No pneumothorax. IMPRESSION: 1. Low lung volumes. 2. Elevated right hemidiaphragm versus volume loss at the right lung base, this may be due to atelectasis/collapse or pleural fluid. 3. Cardiomegaly.  Improved vascular congestion. Electronically Signed   By: Keith Rake M.D.   On: 09/25/2019 05:57   DG CHEST PORT 1 VIEW  Result Date: 09/23/2019 CLINICAL DATA:  Shortness of breath.  Recent ACDF. EXAM: PORTABLE CHEST 1 VIEW COMPARISON:  Chest x-ray dated July 20, 2019. FINDINGS: Stable mild cardiomegaly status post CABG. Mild pulmonary vascular congestion. Low lung volumes with right greater than left basilar atelectasis. Possible small right pleural effusion. No pneumothorax. No acute osseous abnormality. Prior cervical  ACDF. IMPRESSION: 1. Mild pulmonary vascular congestion. Possible small right pleural effusion. 2. Low lung volumes with right greater than left basilar opacities, favor atelectasis. Electronically Signed   By: Titus Dubin M.D.   On: 09/23/2019 09:00   VAS [place] LOWER EXTREMITY VENOUS (DVT)  Result Date: 09/25/2019  Lower Venous Study Indications: Acute respiratory failure with hypoxia.  Performing Technologist: Oda Cogan RDMS, RVT  Examination Guidelines: A complete evaluation includes B-mode imaging, spectral Doppler, color Doppler, and power Doppler as needed of all accessible portions of each vessel. Bilateral testing is considered an integral part of a complete examination. Limited examinations for reoccurring indications may be performed as noted.  +---------+---------------+---------+-----------+----------+--------------+ RIGHT    CompressibilityPhasicitySpontaneityPropertiesThrombus Aging +---------+---------------+---------+-----------+----------+--------------+ CFV      Full           Yes      Yes                                 +---------+---------------+---------+-----------+----------+--------------+ SFJ      Full                                                        +---------+---------------+---------+-----------+----------+--------------+ FV Prox  Full                                                        +---------+---------------+---------+-----------+----------+--------------+ FV Mid   Full                                                        +---------+---------------+---------+-----------+----------+--------------+  FV DistalFull                                                        +---------+---------------+---------+-----------+----------+--------------+ PFV      Full                                                        +---------+---------------+---------+-----------+----------+--------------+ POP      Full           Yes       Yes                                 +---------+---------------+---------+-----------+----------+--------------+ PTV      Full                                                        +---------+---------------+---------+-----------+----------+--------------+ PERO     Full                                                        +---------+---------------+---------+-----------+----------+--------------+   +---------+---------------+---------+-----------+----------+--------------+ LEFT     CompressibilityPhasicitySpontaneityPropertiesThrombus Aging +---------+---------------+---------+-----------+----------+--------------+ CFV      Full           Yes      Yes                                 +---------+---------------+---------+-----------+----------+--------------+ SFJ      Full                                                        +---------+---------------+---------+-----------+----------+--------------+ FV Prox  Full                                                        +---------+---------------+---------+-----------+----------+--------------+ FV Mid   Full                                                        +---------+---------------+---------+-----------+----------+--------------+ FV DistalFull                                                        +---------+---------------+---------+-----------+----------+--------------+  PFV      Full                                                        +---------+---------------+---------+-----------+----------+--------------+ POP      Full           Yes      Yes                                 +---------+---------------+---------+-----------+----------+--------------+ PTV      Full                                                        +---------+---------------+---------+-----------+----------+--------------+ PERO     Full                                                         +---------+---------------+---------+-----------+----------+--------------+     Summary: Right: There is no evidence of deep vein thrombosis in the lower extremity. No cystic structure found in the popliteal fossa. Left: There is no evidence of deep vein thrombosis in the lower extremity. No cystic structure found in the popliteal fossa.  *See table(s) above for measurements and observations. Electronically signed by Gretta Began MD on 09/25/2019 at 5:39:55 PM.    Final    VAS [place] UPPER EXTREMITY VENOUS DUPLEX  Result Date: 10/15/2019 UPPER VENOUS STUDY  Indications: Edema, and Left hand and forearm swelling. Patient has a healing sore on top of left hand. Other Indications: Patient has been experiencing SOB x 4 months. History of CHF. Anticoagulation: Eliquis. Comparison Study: None Performing Technologist: Alecia Mackin : RVT, RDCS (AE), RDMS  Examination Guidelines: A complete evaluation includes B-mode imaging, spectral Doppler, color Doppler, and power Doppler as needed of all accessible portions of each vessel. Bilateral testing is considered an integral part of a complete examination. Limited examinations for reoccurring indications may be performed as noted.  Right Findings: +-----+------------+---------+-----------+----------+-------+ RIGHTCompressiblePhasicitySpontaneousPropertiesSummary +-----+------------+---------+-----------+----------+-------+ IJV      Full       Yes       Yes                      +-----+------------+---------+-----------+----------+-------+  Left Findings: +----------+------------+---------+-----------+----------+-------+ LEFT      CompressiblePhasicitySpontaneousPropertiesSummary +----------+------------+---------+-----------+----------+-------+ IJV           Full       Yes       Yes                      +----------+------------+---------+-----------+----------+-------+ Subclavian    Full       Yes       Yes                       +----------+------------+---------+-----------+----------+-------+ Axillary      Full       Yes       Yes                      +----------+------------+---------+-----------+----------+-------+  Brachial      Full       Yes       Yes                      +----------+------------+---------+-----------+----------+-------+ Radial        Full       Yes       Yes                      +----------+------------+---------+-----------+----------+-------+ Ulnar         Full       Yes       Yes                      +----------+------------+---------+-----------+----------+-------+ Cephalic      Full       Yes       Yes                      +----------+------------+---------+-----------+----------+-------+ Basilic       Full       Yes       Yes                      +----------+------------+---------+-----------+----------+-------+  Summary:  Right: Normal right IJV.  Left: No evidence of deep vein thrombosis in the upper extremity. No evidence of superficial vein thrombosis in the upper extremity. There is superficial edema seen in the mid and distal forearm along with top of hand.  *See table(s) above for measurements and observations.  Diagnosing physician: Coral Else MD Electronically signed by Coral Else MD on 10/15/2019 at 10:45:04 AM.    Final     Labs:  Basic Metabolic Panel: Recent Labs  Lab 10/16/19 0534  NA 140  K 3.8  CL 107  CO2 24  GLUCOSE 103*  BUN 11  CREATININE 1.00  CALCIUM 8.8*    CBC: Recent Labs  Lab 10/16/19 0534  WBC 7.6  NEUTROABS 5.3  HGB 11.5*  HCT 34.1*  MCV 85.9  PLT 302    CBG: No results for input(s): GLUCAP in the last 168 hours.  Family history.  Mother with breast cancer and hypertension.  Father with CAD, CVA and hypertension.  Denies any diabetes mellitus or colon cancer  Brief HPI:   [name] [name] is a 69 y.o. right-handed male with history of CAD status post CABG 2004, lumbar laminectomy, left TKA 2018,  hypertension, diastolic congestive heart failure, anxiety, atrial fibrillation with ablation August 2019 maintained on Eliquis, sleep apnea with CPAP.  Per chart review lives with spouse 1 level home.  Presented 09/21/2019 with severe neck pain and quadriparesis as well as gait instability.  X-rays and imaging  demonstrated a large herniated disc at C4-5 with severe spinal stenosis, cervical myelopathy and spinal cord signal change.  He had moderate stenosis at C3-4-5-6.  Underwent C3-4 4-5 and 5-6 anterior cervical discectomy and decompression, C3-4, 4-5, 5-6 interbody arthrodesis with insertion of interbody prosthesis and anterior cervical plating from C3-C6 with globus titanium plate September 21, 2019 per Dr. Lovell Sheehan.  Cervical brace as directed.  Noted on September 23, 2019 rapid response contacted for respiratory distress requiring nonrebreather mask 15 L with oxygen saturation 95%.  There was some reported swelling at the surgical site suspect hematoma.  Chest x-ray showed mild pulmonary vascular congestion with possible small right pleural effusion.  He was transferred to the ICU follow-up per  critical care medicine.  Patient did improve with the use of CPAP however after taking off CPAP the following morning again with increasing shortness of breath received albuterol and Decadron and advised change to BiPAP.NIF testing showed no diaphragmatic paralysis 10/02/2019.  Patient also remained on intravenous Lopressor as well as Lasix with noted history of hypertension atrial fibrillation on intravenous heparin was initiated and Eliquis resumed.  Patient did not require intubation and improved with ongoing respiratory treatment.  Lower extremities Dopplers negative for DVT.  Noted leukocytosis 28,700 felt to be secondary to steroids which were tapered off.  Surgical site follow-up neurosurgery did not appear to show any signs of infection and follow-up imaging demonstrated prevertebral swelling largely resolved.   Mild AKI with creatinine 1.45 Lasix discontinued.  Tolerating a regular diet.  Patient was admitted for a comprehensive rehab program   Hospital Course: MARUICE PIERONI was admitted to rehab 10/03/2019 for inpatient therapies to consist of PT, ST and OT at least three hours five days a week. Past admission physiatrist, therapy team and rehab RN have worked together to provide customized collaborative inpatient rehab.  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 Lovell Sheehan complicated by acute respiratory failure with follow-up per pulmonary services.  Surgical site healing nicely he was cleared from any signs of hematoma or abscess.  Cervical collar as directed.  His chronic Eliquis was resumed no bleeding episodes.  Pain managed with use of Flexeril and oxycodone.  Patient with documented history of anxiety he remained on Ativan as scheduled as well as Zoloft with emotional support provided in follow-up per neuropsychology..  Patient with history of atrial fibrillation as noted he was transitioned back to his Eliquis cardiac rate remained controlled he continue with Cardizem as well as Lopressor and patient would follow-up outpatient with cardiology services.  Blood pressures controlled he continued on Aldactone as well as hydralazine.  No chest pain noted with history of CAD with CABG as noted he continued with Eliquis.  Bouts of leukocytosis afebrile felt to be induced by steroids that were tapered to off.  Noted history of diastolic congestive heart failure monitoring for any signs of fluid overload he had recently had his Lasix discontinued due to AKI as patient had been taking 20 mg at home as needed for swelling prior to admission.Marland Kitchen  BPH with neurogenic bladder Flomax as advised.  Neurogenic bowel with bowel program established.  In regards to  patient's respiratory insufficiency history of nonvent dependent respiratory failure he had recent been changed to BiPAP follow-up per pulmonary services advised he can continue his CPAP at home there was some question of diaphragmatic paralysis during work-up of CTA of chest that was negative for pulmonary emboli with latest NIF testing negative and he could follow-up outpatient with pulmonary services.   Blood pressures were monitored on TID basis and somewhat soft and monitored  He/ is continent of bowel and bladder.  He/ has made gains during rehab stay and is attending therapies  He/ will continue to receive follow up therapies   after discharge  Rehab course: During patient's stay in rehab weekly team conferences were held to monitor patient's progress, set goals and discuss barriers to discharge. At admission, patient required minimal assist side-lying to sitting, moderate assist sit to supine, max assist sit to side-lying.  Minimal assist ambulate 20 feet rolling walker.  Moderate assist upper body bathing max assist lower body bathing moderate assist upper body dressing max is lower body dressing minimal assist toilet transfers  Physical exam.  Blood pressure 120/67 pulse 64 temperature 98.7 respirations 18 oxygen saturation 96% room air Constitutional.  Alert and oriented somewhat anxious obese barrel chested HEENT Head.  Atraumatic Mouth/throat.  Oropharynx clear and moist no oropharyngeal exudate Eyes.  Pupils round and reactive to light no discharge without nystagmus Cardiac.  Irregular irregular without murmur Respiratory effort normal no respiratory distress without wheezes quiet breath sounds Abdomen.  Soft protuberant positive bowel sounds Neurological.  Alert and oriented right upper extremity biceps 4+ out of 5, wrist extension 4+ out of 5, tricep 4+ out of 5, grip 4+ out of 5, and finger abduction 4+ out of 5. Left upper extremity biceps 3+ out of 5, wrist extension 3+ out  of 5, tricep 3 out of 5, grip 3 out of 5, finger abduction 3- out of 5 Right lower extremity hip flexors 5- out of 5 knee extension 4+ out of 5 dorsi flexion 5- out of 5 plantar flexion 5- out of 5 EHL 4+ out of 5 Left lower extremity hip flexors 4- out of 5 knee extension 4 out of 5 dorsi flexion 4- out of 5 plantar flexion 4 out of 5 EHL 4- out of 5  He/ has had improvement in activity tolerance, balance, postural control as well as ability to compensate for deficits. He/ has had improvement in functional use RUE/LUE  and RLE/LLE as well as improvement in awareness.  Working with energy conservation techniques.  Patient instructed in dynamic balance training.  Demonstrates improved fine motor control.  Able to remain standing up to 10 minutes before requiring prolonged seated rest break.  Oxygen sats remained greater than 94%.  Ambulating rolling walker 150 feet supervision assist with minimal cues for safety.  Patient completed toileting task with close supervision.  Patient completed functional ability to the sink with rolling walker supervision.  Patient able to complete oral care with set up assistance.  Patient doffed all clothing in standing with ongoing education and safety awareness.  Required minimal assist to support right upper extremity and shoulder flexion to reach chair to wash.  Pleated upper lower body bathing seated with sit to stand with grab bar support requiring to stand.  Full family teaching completed plan discharge to home.Patient declined all follow up therapies       Disposition: Discharge to home    Diet: Regular  Special Instructions: No driving smoking or alcohol  Continue CPAP as prior to admission.  Follow-up outpatient pulmonary services  Cervical collar as directed  Medications at discharge 1.  Tylenol as needed 2.  Eliquis 5 mg p.o. twice daily 3.  Lipitor 20 mg p.o. daily 4.  Dulcolax suppository daily as needed 5.  Flexeril 10 mg 3 times daily as  needed 6.  Cardizem CD 180 mg p.o. daily 7.  Hydralazine 75 mg p.o. 3 times daily 8.  Ativan 0.5 mg 4 times daily 9.  Lopressor 12.5 mg p.o. twice daily 10.  Oxycodone 10 mg every 4 as needed pain 11.  Protonix 40 mg p.o. daily 12.  Senokot S1 tablet p.o. twice daily 13.  Zoloft 50 mg p.o. daily 14.  Aldactone 25 mg p.o. daily 15.  Flomax 0.8 mg daily  Discharge Instructions    Ambulatory referral to Physical Medicine Rehab   Complete by: As directed    Follow up one month  SCI/myeloathy      Follow-up Information    Lovorn, Aundra Millet, MD Follow up.   Specialty: Physical Medicine and Rehabilitation Why: Office to call for appointment Contact information: [address] [place] [address] [phone]        Antoine Poche, MD Follow up.   Specialty: Cardiology Why: Call for appointment Contact information: [address] [place] [address] [phone]        Tressie Stalker, MD Follow up.   Specialty: Neurosurgery Why: Call for appointment Contact information: [address] [place] [address] [phone]        [facility] Follow up.   Specialty: Urology Why: Call for appointment as needed Contact information: [address] [place] [address] [phone]       Oretha Milch, MD Follow up.   Specialty: Pulmonary Disease Why: Call for appointment Contact information: [address] [place] [address] [phone]        Audie Box L, DO Follow up in 2 week(s).   Specialty: Pulmonary Disease Why: we will call to make you appt Contact information: [address] [place] [address] [phone]           Signed: Mcarthur Rossetti Caylan Schifano 10/17/2019, 5:13 AM

## 2019-10-15 NOTE — Progress Notes (Signed)
Nif of greater than -40 cm H2O performed and VC of 0.9 L performed with good effort.

## 2019-10-15 NOTE — Progress Notes (Signed)
NIF more negative than -40  VC .700 ml (best of 3)  Good patient effort noted.

## 2019-10-16 ENCOUNTER — Inpatient Hospital Stay (HOSPITAL_COMMUNITY): Payer: Medicare Other | Admitting: Physical Therapy

## 2019-10-16 ENCOUNTER — Telehealth: Payer: Self-pay | Admitting: *Deleted

## 2019-10-16 ENCOUNTER — Encounter (HOSPITAL_COMMUNITY): Payer: Medicare Other

## 2019-10-16 ENCOUNTER — Ambulatory Visit (HOSPITAL_COMMUNITY): Payer: Medicare Other | Admitting: Physical Therapy

## 2019-10-16 LAB — COMPREHENSIVE METABOLIC PANEL
ALT: 28 U/L (ref 0–44)
AST: 20 U/L (ref 15–41)
Albumin: 2.2 g/dL — ABNORMAL LOW (ref 3.5–5.0)
Alkaline Phosphatase: 81 U/L (ref 38–126)
Anion gap: 9 (ref 5–15)
BUN: 11 mg/dL (ref 8–23)
CO2: 24 mmol/L (ref 22–32)
Calcium: 8.8 mg/dL — ABNORMAL LOW (ref 8.9–10.3)
Chloride: 107 mmol/L (ref 98–111)
Creatinine, Ser: 1 mg/dL (ref 0.61–1.24)
GFR calc Af Amer: >60 mL/min (ref >60)
GFR calc non Af Amer: >60 mL/min (ref >60)
Glucose, Bld: 103 mg/dL — ABNORMAL HIGH (ref 70–99)
Potassium: 3.8 mmol/L (ref 3.5–5.1)
Sodium: 140 mmol/L (ref 135–145)
Total Bilirubin: 0.6 mg/dL (ref 0.3–1.2)
Total Protein: 5.8 g/dL — ABNORMAL LOW (ref 6.5–8.1)

## 2019-10-16 LAB — CBC WITH DIFFERENTIAL/PLATELET
Abs Immature Granulocytes: 0.05 10*3/uL (ref 0.00–0.07)
Basophils Absolute: 0 10*3/uL (ref 0.0–0.1)
Basophils Relative: 1 %
Eosinophils Absolute: 0.2 10*3/uL (ref 0.0–0.5)
Eosinophils Relative: 3 %
HCT: 34.1 % — ABNORMAL LOW (ref 39.0–52.0)
Hemoglobin: 11.5 g/dL — ABNORMAL LOW (ref 13.0–17.0)
Immature Granulocytes: 1 %
Lymphocytes Relative: 16 %
Lymphs Abs: 1.2 10*3/uL (ref 0.7–4.0)
MCH: 29 pg (ref 26.0–34.0)
MCHC: 33.7 g/dL (ref 30.0–36.0)
MCV: 85.9 fL (ref 80.0–100.0)
Monocytes Absolute: 0.8 10*3/uL (ref 0.1–1.0)
Monocytes Relative: 10 %
Neutro Abs: 5.3 10*3/uL (ref 1.7–7.7)
Neutrophils Relative %: 69 %
Platelets: 302 10*3/uL (ref 150–400)
RBC: 3.97 MIL/uL — ABNORMAL LOW (ref 4.22–5.81)
RDW: 13.3 % (ref 11.5–15.5)
WBC: 7.6 10*3/uL (ref 4.0–10.5)
nRBC: 0 % (ref 0.0–0.2)

## 2019-10-16 MED ORDER — APIXABAN 5 MG PO TABS: 5.0000 mg | ORAL_TABLET | Freq: Two times a day (BID) | ORAL | 0 refills | Status: DC

## 2019-10-16 MED ORDER — SPIRONOLACTONE 25 MG PO TABS: 25.0000 mg | ORAL_TABLET | Freq: Every day | ORAL | 1 refills | Status: DC

## 2019-10-16 MED ORDER — SERTRALINE HCL 50 MG PO TABS: 50.0000 mg | ORAL_TABLET | Freq: Every day | ORAL | 1 refills | Status: DC

## 2019-10-16 MED ORDER — METOPROLOL TARTRATE 25 MG PO TABS: 12.5000 mg | ORAL_TABLET | Freq: Two times a day (BID) | ORAL | 1 refills | Status: DC

## 2019-10-16 MED ORDER — OMEPRAZOLE 20 MG PO CPDR: DELAYED_RELEASE_CAPSULE | ORAL | 3 refills | Status: DC

## 2019-10-16 MED ORDER — ATORVASTATIN CALCIUM 20 MG PO TABS: 20.0000 mg | ORAL_TABLET | Freq: Every day | ORAL | 1 refills | Status: DC

## 2019-10-16 MED ORDER — LORAZEPAM 0.5 MG PO TABS: 0.5000 mg | ORAL_TABLET | Freq: Four times a day (QID) | ORAL | 0 refills | Status: DC

## 2019-10-16 MED ORDER — HYDRALAZINE HCL 50 MG PO TABS: 75.0000 mg | ORAL_TABLET | Freq: Three times a day (TID) | ORAL | 1 refills | Status: DC

## 2019-10-16 MED ORDER — DILTIAZEM HCL ER COATED BEADS 180 MG PO CP24: 180.0000 mg | ORAL_CAPSULE | Freq: Every day | ORAL | 3 refills | Status: DC

## 2019-10-16 MED ORDER — ACETAMINOPHEN 325 MG PO TABS: 650.0000 mg | ORAL_TABLET | ORAL | Status: AC | PRN

## 2019-10-16 MED ORDER — OXYCODONE HCL 10 MG PO TABS: 10.0000 mg | ORAL_TABLET | ORAL | 0 refills | Status: DC | PRN

## 2019-10-16 MED ORDER — CYCLOBENZAPRINE HCL 10 MG PO TABS: 10.0000 mg | ORAL_TABLET | Freq: Three times a day (TID) | ORAL | 0 refills | Status: DC | PRN

## 2019-10-16 MED ORDER — TAMSULOSIN HCL 0.4 MG PO CAPS: 0.8000 mg | ORAL_CAPSULE | Freq: Every day | ORAL | 0 refills | Status: DC

## 2019-10-16 NOTE — Telephone Encounter (Signed)
Still admitted  Will forward to triage to f/u on

## 2019-10-16 NOTE — Progress Notes (Signed)
Occupational Therapy Session Note  Patient Details  Name: BIRT REINOSO MRN: [id number] Date of Birth: 04-Dec-1950  Today's Date: 10/16/2019 OT Individual Time: 1000-1100 OT Individual Time Calculation (min): 60 min    Short Term Goals: Week 2:  OT Short Term Goal 1 (Week 2): STG=LTG secondary to ELOS  Skilled Therapeutic Interventions/Progress Updates:     Pt resting in recliner upon arrival with wife present.  Pt on RA with O2 sats=95%. Pt initially declined shower this morning but wife encouraged pt to bathe in shower.  Pt agreeable.  Pt stood at sink to brush teeth before amb with RW to bathroom to use toilet and transfer to shower. Pt's wife eager to assist pt with bathing/dressing tasks. Wife and pt educated on importance of pt completing tasks independently to continue with progress.  Both verbalized understanding. Pt issued handout for theraputty exercises.  Pt with foam blocks and soft and med soft theraputty to increase progress with functional use of BUE. Educated pt and wife on importance of daily schedule/routine.  Pt declined HHOT and OPOT 2/2 Covid restrictions and concerns. Pt and wife pleased with progress and ready for discharge tomorrow. Pt remained in recliner with wife present.   Therapy Documentation Precautions:  Precautions Precautions: Cervical Precaution Booklet Issued: Yes (comment) Precaution Comments: verbal discussion of precautions Required Braces or Orthoses: Cervical Brace Cervical Brace: Hard collar Restrictions Weight Bearing Restrictions: No   Pain:  Pt denies pain this morning   Therapy/Group: Individual Therapy  [name] 10/16/2019, 12:18 PM

## 2019-10-16 NOTE — Progress Notes (Signed)
Occupational Therapy Discharge Summary  Patient Details  Name: [name] [name] MRN: [id number] Date of Birth: 01-Oct-1951  Patient has met 9 of 11 long term goals due to improved activity tolerance, improved balance, postural control, ability to compensate for deficits and improved coordination.  Pt made steady progress with BADLs and BUE function during this admission.  Pt continues to require assistance with UB and LB dressing tasks 2/2 limited functional use of LUE and limited AROM of BUE. Pt completes all other tasks at supervision level.  Functional transfers with supervision. Pt's wife has been present and participated in therapy sessions. Pt's wife is eager to assist patient with BADLs. Pt and wife educated on [place] home therapy tasks.  Pt issued foam blocks and theraputty. Pt has opted to not receive HHOT or OPOT services 2/2 Covid concers. Patient to discharge at overall Supervision level.  Patient's care partner is independent to provide the necessary physical assistance at discharge.    Recommendation:  Patient requires no further OT follow up at d/c.   Equipment: No equipment provided  Reasons for discharge: treatment goals met and discharge from hospital  Patient/family agrees with progress made and goals achieved: Yes  OT Discharge Vision Baseline Vision/History: Wears glasses Wears Glasses: Reading only Patient Visual Report: No change from baseline Vision Assessment?: No apparent visual deficits Perception  Perception: Within Functional Limits Praxis Praxis: Intact Cognition Overall Cognitive Status: Within Functional Limits for tasks assessed Arousal/Alertness: Awake/alert Orientation Level: Oriented X4 Attention: Focused Focused Attention: Appears intact Immediate Memory Recall: Sock;Blue;Bed Memory Recall Sock: Without Cue Memory Recall Blue: Without Cue Memory Recall Bed: Without Cue Awareness: Appears intact Problem Solving: Appears intact Safety/Judgment:  Appears intact Sensation Sensation Light Touch: Impaired by gross assessment Proprioception: Appears Intact Coordination Gross Motor Movements are Fluid and Coordinated: No Fine Motor Movements are Fluid and Coordinated: No Motor  Motor Motor: Tetraplegia;Abnormal tone;Abnormal postural alignment and control Trunk/Postural Assessment  Cervical Assessment Cervical Assessment: Exceptions to WFL(cervical precautions; aspen collar) Thoracic Assessment Thoracic Assessment: (rounded shoulders) Lumbar Assessment Lumbar Assessment: (posterior pelvic tilt)  Balance Dynamic Sitting Balance Dynamic Sitting - Balance Support: Feet supported;During functional activity Dynamic Sitting - Level of Assistance: 6: Modified independent (Device/Increase time) Extremity/Trunk Assessment RUE Assessment RUE Assessment: Exceptions to [facility] General Strength Comments: 0-100* shoulder flexion; generalized weakness; 3+/5 overall LUE Assessment LUE Assessment: Exceptions to [facility] General Strength Comments: 0-90* shoulder flex, full elbow, decreased gross grasp, 2+/5 overall   Leroy Libman 10/16/2019, 6:25 AM

## 2019-10-16 NOTE — Plan of Care (Signed)
  Problem: Consults Goal: RH SPINAL CORD INJURY PATIENT EDUCATION Description:  See Patient Education module for education specifics.  10/16/2019 1937 by Kalman Shan, LPN Outcome: Progressing 10/16/2019 1936 by Kalman Shan, LPN Outcome: Progressing   Problem: SCI BOWEL ELIMINATION Goal: RH STG MANAGE BOWEL WITH ASSISTANCE Description: STG Manage Bowel with min Assistance. 10/16/2019 1937 by Kalman Shan, LPN Outcome: Progressing 10/16/2019 1936 by Doran Durand A, LPN Outcome: Progressing Goal: RH STG SCI MANAGE BOWEL WITH MEDICATION WITH ASSISTANCE Description: STG SCI Manage bowel with medication with min assistance. 10/16/2019 1937 by Kalman Shan, LPN Outcome: Progressing 10/16/2019 1936 by Doran Durand A, LPN Outcome: Progressing   Problem: SCI BLADDER ELIMINATION Goal: RH STG MANAGE BLADDER WITH ASSISTANCE Description: STG Manage Bladder With min Assistance 10/16/2019 1937 by Kalman Shan, LPN Outcome: Progressing 10/16/2019 1936 by Kalman Shan, LPN Outcome: Progressing Goal: RH STG MANAGE BLADDER WITH EQUIPMENT WITH ASSISTANCE Description: STG Manage Bladder With Equipment With max Assistance 10/16/2019 1937 by Kalman Shan, LPN Outcome: Progressing 10/16/2019 1936 by Kalman Shan, LPN Outcome: Progressing   Problem: RH SKIN INTEGRITY Goal: RH STG SKIN FREE OF INFECTION/BREAKDOWN Description: Skin to remain free from breakdown while on rehab with min assist. 10/16/2019 1937 by Kalman Shan, LPN Outcome: Progressing 10/16/2019 1936 by Kalman Shan, LPN Outcome: Progressing Goal: RH STG MAINTAIN SKIN INTEGRITY WITH ASSISTANCE Description: STG Maintain Skin Integrity With min Assistance. 10/16/2019 1937 by Kalman Shan, LPN Outcome: Progressing 10/16/2019 1936 by Kalman Shan, LPN Outcome: Progressing Goal: RH STG ABLE TO PERFORM INCISION/WOUND CARE W/ASSISTANCE Description: STG Able To Perform  Incision/Wound Care With min Assistance. 10/16/2019 1937 by Kalman Shan, LPN Outcome: Progressing 10/16/2019 1936 by Kalman Shan, LPN Outcome: Progressing   Problem: RH SAFETY Goal: RH STG ADHERE TO SAFETY PRECAUTIONS W/ASSISTANCE/DEVICE Description: STG Adhere to Safety Precautions With min Assistance and appropriate assistive Device. 10/16/2019 1937 by Kalman Shan, LPN Outcome: Progressing 10/16/2019 1936 by Kalman Shan, LPN Outcome: Progressing   Problem: RH PAIN MANAGEMENT Goal: RH STG PAIN MANAGED AT OR BELOW PT'S PAIN GOAL Description: <3 on a 0-10 pain scale 10/16/2019 1937 by Kalman Shan, LPN Outcome: Progressing 10/16/2019 1936 by Kalman Shan, LPN Outcome: Progressing

## 2019-10-16 NOTE — Consult Note (Signed)
NAME:  [name] [name], MRN:  [id number], DOB:  05-Feb-1951, LOS: 13 ADMISSION DATE:  10/03/2019, CONSULTATION DATE:  1/8 REFERRING MD:  Dr, Berline Chough (CIR), CHIEF COMPLAINT:  Hypoxemia  Brief History   69 year old man with OSA, hypertension, atrial fibrillation underwent multilevel ACDF on 12/17 and developed acute respiratory distress 12/18 requiring transfer to ICU and BiPAP. Discharged to CIR where he continues to have a new oxygen requirement.   History of present illness   69 year old male with PMH as below, which is significant for OSA, HTN, and AF. He was admitted recently for elective ACDF of C3-4, C4-5, and C5-6. Surgery was complicated by surgical site hematoma, which ultimately did not require surgical intervention. Course also complicated by respiratory distress prompting ICU transfer and BiPAP. He required BiPAP off and on for several days. Etiology not entirely clear, but was felt to be due to some combination of neck swelling/hematoma and volume overload. There was some question of diaphragmatic injury vs PE. Ultimately his BiPAP requirement was waned down to QHS and he was discharged to CIR on 12/29. Since his time there he has had limited ability to wean off of supplemental oxygen, which has inhibited his therapy. PCCM consulted for hypoxia.  Past Medical History   has a past medical history of Anxiety, CHF (congestive heart failure) (HCC), Coronary artery disease, COVID-19, DJD (degenerative joint disease), Dyspnea, GERD (gastroesophageal reflux disease), History of kidney stones, HLD (hyperlipidemia), Hypertension, MI, old (2004), OA (osteoarthritis), Obesity, Persistent atrial fibrillation (HCC), Sleep apnea, Spinal stenosis, and Typical atrial flutter (HCC).  Significant Hospital Events   12/17 >12/29: [facility] admission for c-spine surgery 12/29 admit to CIR 1/8 PCCM consult for hypoxia SOB  Consults:  PCCM 1/8  Procedures:    Significant Diagnostic Tests:  CTA chest 1/8 >  Negative for acute pulmonary embolism. Volume loss of bilateral lower chest, left greater than right, with elevation of the left and right hemidiaphragm. Cardiomegaly without signs of pericardial effusion. Mild stranding in the anterior neck greater on the left than the right, may represent edema or inflammation Sniff test 12/28 > Limited motion with low volume chest of the right and left hemidiaphragm. No signs of paradoxical motion to suggest diaphragmatic paralysis  Micro Data:    Antimicrobials:    Interim history/subjective:  Doing better, anxious to get home.  Still has orthopnea but improved.  Denies cough or chest pain.  Objective   Blood pressure 131/75, pulse 66, temperature 97.7 F (36.5 C), resp. rate 17, height 5\' 6"  (1.676 m), weight 103.8 kg, SpO2 98 %.        Intake/Output Summary (Last 24 hours) at 10/16/2019 1355 Last data filed at 10/15/2019 2100 Gross per 24 hour  Intake 120 ml  Output --  Net 120 ml   Filed Weights   10/14/19 2134 10/15/19 0455 10/16/19 0508  Weight: 102.4 kg 105.4 kg 103.8 kg    Examination: GEN: elderly obese man in NAD HEENT: MMM, malampatti 4 CV: RRR, ext warm PULM: Absent on L, present on R GI: Soft, +BS EXT: No edema NEURO: Moves all 4 ext to command PSYCH: AOx3, loquacious SKIN: No rashes   Resolved Hospital Problem list     Assessment & Plan:   Acute hypoxic respiratory failure: improving.  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 - Continue increasing mobility, IS - CPAP at night and PRN during day - Will do tele visit in 2-4 weeks to assure  continued improvement  Erskine Emery MD PCCM

## 2019-10-16 NOTE — Telephone Encounter (Signed)
-----   Message from Lorin Glass, MD sent at 10/16/2019  2:02 PM EST ----- Regarding: Tele visit f/u 2-4 weeks Post C spine surgery dyspnea/diaphragm dysfunction  Thanks Jesusita Oka

## 2019-10-16 NOTE — Progress Notes (Signed)
RT went into pts room to do VC and NIF pt was already on cpap and did not want to take it off. Pt stated he would do it again in the morning.

## 2019-10-16 NOTE — Progress Notes (Signed)
NIF greater than -40 cmH2O. VC of 1.1L. Both performed with good effort.  Mat Carne RRT

## 2019-10-16 NOTE — Progress Notes (Signed)
[place] PHYSICAL MEDICINE & REHABILITATION PROGRESS NOTE   Subjective/Complaints:   Pt reports breathing MUCH better- SOB barely occurs anymore- no more O2  Since Sat vs Sunday- just CPAP at night and when taking naps. NIFs >-40 and FVC 1.1L- improving daily- was 09. And prior to that was 0.45L.  Doesn't want Home Health or outpt therapy at all- refuses ot even let his kids in home at all.    ROS- pt denied  CP, HA, N/V/D; abd pain/fullness . Denies SOB when up; only when laying down and much improved.  Objective:   No results found. Recent Labs    10/16/19 0534  WBC 7.6  HGB 11.5*  HCT 34.1*  PLT 302   Recent Labs    10/16/19 0534  NA 140  K 3.8  CL 107  CO2 24  GLUCOSE 103*  BUN 11  CREATININE 1.00  CALCIUM 8.8*    Intake/Output Summary (Last 24 hours) at 10/16/2019 0947 Last data filed at 10/15/2019 2100 Gross per 24 hour  Intake 373 ml  Output --  Net 373 ml     Physical Exam: Vital Signs Blood pressure 131/75, pulse 66, temperature 97.7 F (36.5 C), resp. rate 17, height 5\' 6"  (1.676 m), weight 103.8 kg, SpO2 98 %.  Constitutional:  Obese, barrel chested man sitting in bedside chair;with PT; finished breakfast, less SOB, no accessory muscle use to breathe, NAD HENT:  Head: Atraumatic.  Mouth/Throat: Oropharynx is clear  Eyes:  EOM are normal.  Neck:  Incision on anterior neck-, but appears to be glued- a lot of bruising and ecchymosis which is resolving Cardiovascular:irregular rhythm, rate in 60s-which is lower than normal Respiratory: CTA B/L-decreased breath sounds bilateral bases but improved; no accessory muscle use noted.  GI: soft, NT, ND, (+)BS  Musculoskeletal:  Comments: RUE bicep 4+/5, WE 4+/5, triceps 4+/5, grip 4+/5, and finger abd 4+/5, deltoid 3 - LUE- biceps 3+/5, WE 3+/5, triceps 3/5, grip 3/5, finger abd 3-/5, deltoid to minus RLE- HF 5-/5, KE 4+/5, DF 5-/5, PF 5-/5, EHL 4+/5 LLE- HF 4-/5, KE 4/5, DF 4-/5, PF 4/5, EHL 4-/5,  appears unchanged Neurological: . Oriented x3. Hoffman's B/L in UEs- no clonus B/L Decreased sensation to light touch L C5-T1 otherwise intact to light touch in all 3 other extremities and torso B/L Skin:  Surgical site neck hematoma resolving. Noted ecchymosis at site. LUE esp hand and forearm very swollen- pitting edema on dorsum of hand 2-3+; no swelling of RUE and LEs 1+ with TEDs in place B/L to ankles Psychiatric:  Less anxious overall; less SOB   Assessment/Plan: 1. 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  Physiatrist is providing close team supervision and 24 hour management of active medical problems listed below.  Physiatrist and rehab team continue to assess barriers to discharge/monitor patient progress toward functional and medical goals  Care Tool:  Bathing    Body parts bathed by patient: Right arm, Left arm, Chest, Abdomen, Front perineal area, Right upper leg, Left upper leg, Right lower leg, Left lower leg, Face, Buttocks   Body parts bathed by helper: Buttocks     Bathing assist Assist Level: Contact Guard/Touching assist     Upper Body Dressing/Undressing Upper body dressing   What is the patient wearing?: Pull over shirt    Upper body assist Assist Level: Moderate Assistance - Patient 50 - 74%    Lower  Body Dressing/Undressing Lower body dressing      What is the patient wearing?: Pants     Lower body assist Assist for lower body dressing: Moderate Assistance - Patient 50 - 74%     Toileting Toileting    Toileting assist Assist for toileting: Contact Guard/Touching assist     Transfers Chair/bed transfer  Transfers assist     Chair/bed transfer assist level: Supervision/Verbal cueing Chair/bed transfer assistive device: Programmer, multimedia   Ambulation assist      Assist level: Supervision/Verbal  cueing Assistive device: Walker-rolling Max distance: 125'   Walk 10 feet activity   Assist     Assist level: Supervision/Verbal cueing Assistive device: Walker-rolling   Walk 50 feet activity   Assist    Assist level: Supervision/Verbal cueing Assistive device: Walker-rolling    Walk 150 feet activity   Assist Walk 150 feet activity did not occur: Safety/medical concerns  Assist level: Supervision/Verbal cueing Assistive device: Walker-rolling    Walk 10 feet on uneven surface  activity   Assist Walk 10 feet on uneven surfaces activity did not occur: Safety/medical concerns         Wheelchair     Assist Will patient use wheelchair at discharge?: No             Wheelchair 50 feet with 2 turns activity    Assist            Wheelchair 150 feet activity     Assist          Blood pressure 131/75, pulse 66, temperature 97.7 F (36.5 C), resp. rate 17, height 5\' 6"  (1.676 m), weight 103.8 kg, SpO2 98 %. Problem List and Plan:  1. 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 Soft Cervical collar when OOB  Continue CIR PT OT 15/7 schedule -patient may shower if cover cervical incision  -ELOS/Goals: 16-20 days/Goals Supervision- CGA    2. Antithrombotics:  -DVT/anticoagulation: Resume Eliquis 5 mg twice daily as prior to admission; off heparin gtt  -antiplatelet therapy: [name]  3. Pain Management: Flexeril and oxycodone as needed  4. Mood: Zoloft 50 mg daily, Klonopin 0.25 mg twice daily- will check if was changed from dose at home;   12/31- changed to Ativan QID 0.5 mg- home dose -antipsychotic agents: [name]  5. Neuropsych: This patient is capable of making decisions on his own behalf.  6. Skin/Wound Care: Routine skin checks  7.  Fluids/Electrolytes/Nutrition: Routine in and outs with follow-up chemistries  8. History of atrial fibrillation. Patient transitioned from intravenous heparin back to chronic Eliquis. Cardiac rate controlled. Continue Cardizem 100 mg daily, Lopressor 12.5 mg twice daily   1/2- rate on low side this AM- will check EKG after d/w Cardiology. Will determine plan once done.  1/3- EKG looks good- will con't regimen 9. Hypertension. Aldactone 25 mg daily, hydralazine 75 mg 3 times daily. Monitor with increased mobility   Controlled, 1/11 Vitals:   10/16/19 0036 10/16/19 0508  BP:  131/75  Pulse: 63 66  Resp:  17  Temp:  97.7 F (36.5 C)  SpO2: 96% 98%   10. History of CAD with CABG 2004. No complaints of increasing chest pain.  11. Leukocytosis. Felt to be steroid induced. Prednisone taper completed. Follow-up CBC   12/30- down to 21.7 form 28k  1/2- WBC down to 16.1k- doing great  1/4-  WBC down to 14.1k  1/11- WBC down to 7.6K much improved 12. Diastolic congestive heart failure. Monitor for any signs of fluid overload   12/30- no weights yet  1/1- didn't have daily weights- changed order   Filed Weights   10/14/19 2134 10/15/19 0455 10/16/19 0508  Weight: 102.4 kg 105.4 kg 103.8 kg   1/3- weight stable  1/5- Weight going up - up 4 kg since this past Friday- pt was on Lasix 20 mg prn at home- feels SOB when lays down- c/w fluid overload although Resp exam wasn't- will give Lasix 40 mg x1- know has QT prolongation, but was on at home and tolerated.  1/6- no weight for today- but voided a lot yesterday  1/7- weight down to 102.7kg  1/11- Weight down to 10.38kg  13. OSA. Currently on BiPAP.   12/30- back on BiPAP last night- will ask reps therapy to address loose fitting mask.  1/1- doing BiPAP setting here- will speak with reps therapy plan.  1/4: will ask RT to evaluate machine as he wakes with feeling of SOB at night. If machine is working well, symptoms could be related to anxiety  more than respiratory etiology and may benefit from prn Ativan at night.  1/8- will check CTA of chest as well as LUE Doppler for DVT- hopefully pt can do study since hasn't been able to lay down-  Will order Ativan 2 mg on call for CTA  1/11- CTA (-) for PE- concerned about diaphragmatic paralysis however- NIFs and FVC much improved since then.  14. Hyperlipidemia. Lipitor  15. BPH and neurogenic bladder. Flomax 0.8 mg daily. Check PVRs for 3 days and cath if necessary  16. Constipation/Neurogenic bowel. Laxative assistance; will clean out and see if has control of stool and if not, will put on bowel program.   12/31- will order Mg citrate and then 3-4 hours later, an enema to clear him out. KUB wasn't real helpful, but had more stool when assessed it myself than discussed.  1/1- cleaned out- 2 very large BMs- feeling better 17. GERD. Protonix  18. Anxiety- Will make sure on home doses of Benzo.  12/31- changed to home dose Ativan QID 0.5 mg 19. Respiratory insufficiency, history of non-vent dependent respiratory failure, recently switched from BiPAP to CPAP  12/30- on 5-6L high flow O2- will wean as tolerated  - will order continuous pulse ox for now  1/1- off O2 this AM while in gym.  1/2- was off for PT this AM- asking to go back on this AM- will get EKG per Cards  1/4- EKG looked good-  1/11- off O2 completely- just on CPAP when napping/sleeping.   20. Azotemia  12/30- BUN is 32 down from 40- will monitor; Cr 1.03 down from 1.32  1/5- Cr 1.16 and BUN 18- doing better  1/1- Labs in AM  1/11- Cr 1.0 and BUN 11- much improved- con't regimen 21. Likely AD episode today  12/31- paradoxical breathing, SOB, nasal congestion, anxiety- didn't c/o HA- likely due to constipation -resolved episode 22. Dispo- 1/8- pt said was tomorrow, but d/c scheduled for Tuesday.   1/11- d/c tomorrow LOS: 13 days A FACE TO FACE EVALUATION WAS PERFORMED  [name] [name] 10/16/2019, 9:47 AM

## 2019-10-16 NOTE — Plan of Care (Signed)
  Problem: Consults Goal: RH SPINAL CORD INJURY PATIENT EDUCATION Description:  See Patient Education module for education specifics.  Outcome: Progressing   Problem: SCI BOWEL ELIMINATION Goal: RH STG MANAGE BOWEL WITH ASSISTANCE Description: STG Manage Bowel with min Assistance. Outcome: Progressing Goal: RH STG SCI MANAGE BOWEL WITH MEDICATION WITH ASSISTANCE Description: STG SCI Manage bowel with medication with min assistance. Outcome: Progressing   Problem: SCI BLADDER ELIMINATION Goal: RH STG MANAGE BLADDER WITH ASSISTANCE Description: STG Manage Bladder With min Assistance Outcome: Progressing Goal: RH STG MANAGE BLADDER WITH EQUIPMENT WITH ASSISTANCE Description: STG Manage Bladder With Equipment With max Assistance Outcome: Progressing   Problem: RH SKIN INTEGRITY Goal: RH STG SKIN FREE OF INFECTION/BREAKDOWN Description: Skin to remain free from breakdown while on rehab with min assist. Outcome: Progressing Goal: RH STG MAINTAIN SKIN INTEGRITY WITH ASSISTANCE Description: STG Maintain Skin Integrity With min Assistance. Outcome: Progressing Goal: RH STG ABLE TO PERFORM INCISION/WOUND CARE W/ASSISTANCE Description: STG Able To Perform Incision/Wound Care With min Assistance. Outcome: Progressing   Problem: RH SAFETY Goal: RH STG ADHERE TO SAFETY PRECAUTIONS W/ASSISTANCE/DEVICE Description: STG Adhere to Safety Precautions With min Assistance and appropriate assistive Device. Outcome: Progressing   Problem: RH PAIN MANAGEMENT Goal: RH STG PAIN MANAGED AT OR BELOW PT'S PAIN GOAL Description: <3 on a 0-10 pain scale Outcome: Progressing   

## 2019-10-16 NOTE — Progress Notes (Signed)
Physical Therapy Discharge Summary  Patient Details  Name: [name] [name] MRN: [id number] Date of Birth: Jan 25, 1951  Today's Date: 10/16/2019   Patient has met 6 of 6 long term goals due to improved activity tolerance, improved balance, improved postural control and ability to compensate for deficits.  Patient to discharge at an ambulatory level Supervision.   Patient's care partner is independent to provide the necessary physical assistance at discharge. Patient's wife [name] [name] has completed hands-on family education and is safe to provide Supervision level assist to pt upon d/c home.  Reasons goals not met: Patient has met all rehab goals.  Recommendation:  Patient will benefit from ongoing skilled PT services in home health setting to continue to advance safe functional mobility, address ongoing impairments in endurance, strength, activity tolerance, balance, safety, independence with functional mobility, and minimize fall risk. However, after discussion with patient he is uncomfortable with HHPT due to concerns about Covid-19, plans to follow HEP at this time. HEP has been provided and pt and his wife are independent with performance.  Equipment: No equipment provided  Reasons for discharge: treatment goals met and discharge from hospital  Patient/family agrees with progress made and goals achieved: Yes  PT Discharge Precautions/Restrictions Precautions Precautions: Cervical Precaution Comments: verbal discussion of precautions Required Braces or Orthoses: Cervical Brace Cervical Brace: Hard collar Restrictions Weight Bearing Restrictions: No Vision/Perception  Perception Perception: Within Functional Limits Praxis Praxis: Intact  Cognition Overall Cognitive Status: Within Functional Limits for tasks assessed Arousal/Alertness: Awake/alert Orientation Level: Oriented X4 Attention: Focused Focused Attention: Appears intact Memory: Appears intact Awareness: Appears  intact Problem Solving: Appears intact Safety/Judgment: Appears intact Sensation Sensation Light Touch: Impaired by gross assessment(N/T in LUE, improved since eval) Proprioception: Appears Intact Coordination Gross Motor Movements are Fluid and Coordinated: No Fine Motor Movements are Fluid and Coordinated: No Coordination and Movement Description: impaired 2/2 generalized weakness and debility as well as tetraplegia Motor  Motor Motor: Tetraplegia;Abnormal tone;Abnormal postural alignment and control Motor - Skilled Clinical Observations: tetraplegia Motor - Discharge Observations: tetraplegia; improved since eval  Mobility Bed Mobility Bed Mobility: Rolling Right;Rolling Left;Supine to Sit;Sit to Supine Rolling Right: Supervision/verbal cueing Rolling Left: Supervision/Verbal cueing Supine to Sit: Supervision/Verbal cueing Sitting - Scoot to Edge of Bed: Supervision/Verbal cueing Sit to Supine: Supervision/Verbal cueing Transfers Transfers: Sit to Stand;Stand Pivot Transfers Sit to Stand: Supervision/Verbal cueing Stand to Sit: Supervision/Verbal cueing Stand Pivot Transfers: Supervision/Verbal cueing Stand Pivot Transfer Details: Verbal cues for precautions/safety;Verbal cues for safe use of DME/AE Transfer (Assistive device): Rolling walker Locomotion  Gait Ambulation: Yes Gait Assistance: Supervision/Verbal cueing Gait Distance (Feet): 150 Feet Assistive device: Rolling walker Gait Assistance Details: Verbal cues for precautions/safety;Verbal cues for safe use of DME/AE Gait Gait: Yes Gait Pattern: Impaired Gait Pattern: Wide base of support;Trunk flexed Gait velocity: decreased Stairs / Additional Locomotion Stairs: Yes Stairs Assistance: Supervision/Verbal cueing Stair Management Technique: With walker Number of Stairs: 1 Height of Stairs: 4 Wheelchair Mobility Wheelchair Mobility: No  Trunk/Postural Assessment  Cervical Assessment Cervical Assessment:  Exceptions to WFL(cervical precautions; Aspen collar) Thoracic Assessment Thoracic Assessment: Exceptions to WFL(rounded shoulders) Lumbar Assessment Lumbar Assessment: Exceptions to WFL(posterior pelvic tilt) Postural Control Postural Control: Within Functional Limits  Balance Balance Balance Assessed: Yes Standardized Balance Assessment Standardized Balance Assessment: Berg Balance Test Berg Balance Test Sit to Stand: Able to stand without using hands and stabilize independently Standing Unsupported: Able to stand 2 minutes with supervision Sitting with Back Unsupported but Feet Supported on Floor or Stool: Able to sit  safely and securely 2 minutes Stand to Sit: Sits safely with minimal use of hands Transfers: Able to transfer with verbal cueing and /or supervision Standing Unsupported with Eyes Closed: Able to stand 10 seconds with supervision Standing Ubsupported with Feet Together: Able to place feet together independently and stand for 1 minute with supervision From Standing, Reach Forward with Outstretched Arm: Reaches forward but needs supervision From Standing Position, Pick up Object from Floor: Unable to try/needs assist to keep balance From Standing Position, Turn to Look Behind Over each Shoulder: Needs supervision when turning Turn 360 Degrees: Needs close supervision or verbal cueing Standing Unsupported, Alternately Place Feet on Step/Stool: Able to complete >2 steps/needs minimal assist Standing Unsupported, One Foot in Front: Able to take small step independently and hold 30 seconds Standing on One Leg: Able to lift leg independently and hold equal to or more than 3 seconds Total Score: 31 Dynamic Sitting Balance Dynamic Sitting - Balance Support: Feet supported;During functional activity Dynamic Sitting - Level of Assistance: 6: Modified independent (Device/Increase time) Dynamic Standing Balance Dynamic Standing - Balance Support: During functional  activity;Bilateral upper extremity supported Dynamic Standing - Level of Assistance: 5: Stand by assistance Extremity Assessment   RLE Assessment RLE Assessment: Within Functional Limits General Strength Comments: 5/5 grossly LLE Assessment LLE Assessment: Within Functional Limits General Strength Comments: 4+/5 grossly     Excell Seltzer, PT, DPT 10/16/2019, 11:40 AM

## 2019-10-16 NOTE — Progress Notes (Signed)
Physical Therapy Session Note  Patient Details  Name: [name] [name] MRN: [id number] Date of Birth: May 11, 1951  Today's Date: 10/16/2019 PT Individual Time: 0800-0900; 1100-1130 PT Individual Time Calculation (min): 60 min and 30 min  Short Term Goals: Week 2:  PT Short Term Goal 1 (Week 2): =LTG due to ELOS  Skilled Therapeutic Interventions/Progress Updates:    Session 1: Pt received seated EOB with NT present assisting pt. Pt requesting to use restroom. Sit to stand with Supervision to RW from bed. Ambulation into bathroom with RW at Supervision level. Pt is Supervision for toilet transfer, pericare, and clothing management with RW for balance. Ambulation back into room at to recliner with Supervision and RW. Patient demonstrates increased fall risk as noted by score of  31/56 on Berg Balance Scale.  (<36= high risk for falls, close to 100%; 37-45 significant >80%; 46-51 moderate >50%; 52-55 lower >25%). Reviewed results of balance test, improvement in score from 19/56 upon initial eval, and functional implications for fall risk. Pt remains seated in chair in room with needs in reach, quick release belt and chair alarm in place at end of session. Pt on room air throughout session, SpO2 remains at 91% and above with activity.  Session 2: Pt received seated in recliner in room, no complaints of pain. Pt's wife present for hands-on family education session. Pt is at Supervision level for all transfers with RW, v/c for safe hand placement and decreased speed for safety. Demonstrated to ascend/descend one 4" curb with RW to simulate entrance to patient's home. Pt is able to perform return demo with Supervision assist from wife. Pt and his wife feel comfortable with current assist level that pt requires. Provided handout HEP for BLE standing strengthening therex as well as discussed endurance training via ambulation with RW at home. Pt and his wife feel comfortable performing HEP upon d/c home.  Discussed monitoring O2 with patient and his wife demonstrating good understanding of O2 saturation and breathing techniques. Pt left seated in recliner in room with needs in reach at end of session, wife present.  Therapy Documentation Precautions:  Precautions Precautions: Cervical Precaution Booklet Issued: Yes (comment) Precaution Comments: verbal discussion of precautions Required Braces or Orthoses: Cervical Brace Cervical Brace: Hard collar Restrictions Weight Bearing Restrictions: No   Therapy/Group: Individual Therapy   Peter Congo, PT, DPT  10/16/2019, 11:35 AM

## 2019-10-17 NOTE — Progress Notes (Signed)
[facility] PHYSICAL MEDICINE & REHABILITATION PROGRESS NOTE   Subjective/Complaints:   Pt reports he's doing fine- ready for d/c today. FVC up to 1.3L Doesn't need O2 to go home.    ROS- pt denied  CP, HA, N/V/D; abd pain/fullness . Denies SOB when up; only when laying down and much improved.  Objective:   No results found. Recent Labs    10/16/19 0534  WBC 7.6  HGB 11.5*  HCT 34.1*  PLT 302   Recent Labs    10/16/19 0534  NA 140  K 3.8  CL 107  CO2 24  GLUCOSE 103*  BUN 11  CREATININE 1.00  CALCIUM 8.8*    Intake/Output Summary (Last 24 hours) at 10/17/2019 0953 Last data filed at 10/16/2019 2117 Gross per 24 hour  Intake 237 ml  Output --  Net 237 ml     Physical Exam: Vital Signs Blood pressure 134/65, pulse 79, temperature 97.7 F (36.5 C), resp. rate 16, height 5\' 6"  (1.676 m), weight 103.5 kg, SpO2 95 %.  Constitutional:  Obese, barrel chested man sitting in bedside chair;with PT; finished breakfast, no SOB, no accessory muscle use to breathe, NAD HENT:  Head: Atraumatic.  Mouth/Throat: Oropharynx is clear  Eyes:  EOM are normal.  Neck:  Incision on anterior neck-, but appears to be glued- a lot of bruising and ecchymosis which is resolving Cardiovascular:irregular rhythm, rate in 70s Respiratory: CTA B/L-decreased breath sounds bilateral bases but improved; no accessory muscle use noted.  GI: soft, NT, ND, (+)BS  Musculoskeletal:  Comments: RUE bicep 4+/5, WE 4+/5, triceps 4+/5, grip 4+/5, and finger abd 4+/5, deltoid 3 - LUE- biceps 3+/5, WE 3+/5, triceps 3/5, grip 3/5, finger abd 3-/5, deltoid to minus RLE- HF 5-/5, KE 4+/5, DF 5-/5, PF 5-/5, EHL 4+/5 LLE- HF 4-/5, KE 4/5, DF 4-/5, PF 4/5, EHL 4-/5, appears unchanged Neurological: . Oriented x3. Hoffman's B/L in UEs- no clonus B/L Decreased sensation to light touch L C5-T1 otherwise intact to light touch in all 3 other extremities and torso B/L Skin:  Surgical site neck hematoma resolving.  Noted ecchymosis at site. LUE esp hand and forearm very swollen- pitting edema on dorsum of hand 2-3+; no swelling of RUE and LEs 1+ with TEDs in place B/L to ankles Psychiatric:  Less anxious overall; less SOB   Assessment/Plan: 1. 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  Physiatrist is providing close team supervision and 24 hour management of active medical problems listed below.  Physiatrist and rehab team continue to assess barriers to discharge/monitor patient progress toward functional and medical goals  Care Tool:  Bathing    Body parts bathed by patient: Right arm, Left arm, Chest, Abdomen, Front perineal area, Right upper leg, Left upper leg, Right lower leg, Left lower leg, Face, Buttocks   Body parts bathed by helper: Buttocks     Bathing assist Assist Level: Supervision/Verbal cueing     Upper Body Dressing/Undressing Upper body dressing   What is the patient wearing?: Pull over shirt    Upper body assist Assist Level: Moderate Assistance - Patient 50 - 74%    Lower Body Dressing/Undressing Lower body dressing      What is the patient wearing?: Pants     Lower body assist Assist for lower body dressing: Supervision/Verbal cueing     Toileting Toileting    Toileting assist Assist for toileting: Supervision/Verbal cueing  Transfers Chair/bed transfer  Transfers assist     Chair/bed transfer assist level: Supervision/Verbal cueing Chair/bed transfer assistive device: Geologist, engineering   Ambulation assist      Assist level: Supervision/Verbal cueing Assistive device: Walker-rolling Max distance: 150'   Walk 10 feet activity   Assist     Assist level: Supervision/Verbal cueing Assistive device: Walker-rolling   Walk 50 feet activity   Assist    Assist level: Supervision/Verbal  cueing Assistive device: Walker-rolling    Walk 150 feet activity   Assist Walk 150 feet activity did not occur: Safety/medical concerns  Assist level: Supervision/Verbal cueing Assistive device: Walker-rolling    Walk 10 feet on uneven surface  activity   Assist Walk 10 feet on uneven surfaces activity did not occur: Safety/medical concerns         Wheelchair     Assist Will patient use wheelchair at discharge?: No             Wheelchair 50 feet with 2 turns activity    Assist            Wheelchair 150 feet activity     Assist          Blood pressure 134/65, pulse 79, temperature 97.7 F (36.5 C), resp. rate 16, height 5\' 6"  (1.676 m), weight 103.5 kg, SpO2 95 %. Problem List and Plan:  1. 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 Soft Cervical collar when OOB  Continue CIR PT OT 15/7 schedule -patient may shower if cover cervical incision  -ELOS/Goals: 16-20 days/Goals Supervision- CGA    2. Antithrombotics:  -DVT/anticoagulation: Resume Eliquis 5 mg twice daily as prior to admission; off heparin gtt  -antiplatelet therapy: [name]  3. Pain Management: Flexeril and oxycodone as needed  4. Mood: Zoloft 50 mg daily, Klonopin 0.25 mg twice daily- will check if was changed from dose at home;   12/31- changed to Ativan QID 0.5 mg- home dose -antipsychotic agents: [name]  5. Neuropsych: This patient is capable of making decisions on his own behalf.  6. Skin/Wound Care: Routine skin checks  7. Fluids/Electrolytes/Nutrition: Routine in and outs with follow-up chemistries  8. History of atrial fibrillation. Patient transitioned from intravenous heparin back to chronic Eliquis. Cardiac rate controlled. Continue Cardizem 100 mg daily, Lopressor 12.5 mg twice daily   1/2-  rate on low side this AM- will check EKG after d/w Cardiology. Will determine plan once done.  1/3- EKG looks good- will con't regimen 9. Hypertension. Aldactone 25 mg daily, hydralazine 75 mg 3 times daily. Monitor with increased mobility   Controlled, 1/11 Vitals:   10/16/19 1947 10/17/19 0352  BP:  134/65  Pulse:  79  Resp:  16  Temp:  97.7 F (36.5 C)  SpO2: 97% 95%   10. History of CAD with CABG 2004. No complaints of increasing chest pain.  11. Leukocytosis. Felt to be steroid induced. Prednisone taper completed. Follow-up CBC   12/30- down to 21.7 form 28k  1/2- WBC down to 16.1k- doing great  1/4- WBC down to 14.1k  1/11- WBC down to 7.6K much improved 12. Diastolic congestive heart failure. Monitor for any signs of fluid overload   12/30- no weights yet  1/1- didn't have daily weights- changed order   Filed Weights   10/15/19 0455 10/16/19 0508 10/17/19 0352  Weight: 105.4 kg 103.8 kg  103.5 kg   1/3- weight stable  1/5- Weight going up - up 4 kg since this past Friday- pt was on Lasix 20 mg prn at home- feels SOB when lays down- c/w fluid overload although Resp exam wasn't- will give Lasix 40 mg x1- know has QT prolongation, but was on at home and tolerated.  1/6- no weight for today- but voided a lot yesterday  1/7- weight down to 102.7kg  1/11- Weight down to 10.38kg  13. OSA. Currently on BiPAP.   12/30- back on BiPAP last night- will ask reps therapy to address loose fitting mask.  1/1- doing BiPAP setting here- will speak with reps therapy plan.  1/4: will ask RT to evaluate machine as he wakes with feeling of SOB at night. If machine is working well, symptoms could be related to anxiety more than respiratory etiology and may benefit from prn Ativan at night.  1/8- will check CTA of chest as well as LUE Doppler for DVT- hopefully pt can do study since hasn't been able to lay down-  Will order Ativan 2 mg on call for CTA  1/11- CTA (-) for PE- concerned about  diaphragmatic paralysis however- NIFs and FVC much improved since then.  14. Hyperlipidemia. Lipitor  15. BPH and neurogenic bladder. Flomax 0.8 mg daily. Check PVRs for 3 days and cath if necessary  16. Constipation/Neurogenic bowel. Laxative assistance; will clean out and see if has control of stool and if not, will put on bowel program.   12/31- will order Mg citrate and then 3-4 hours later, an enema to clear him out. KUB wasn't real helpful, but had more stool when assessed it myself than discussed.  1/1- cleaned out- 2 very large BMs- feeling better 17. GERD. Protonix  18. Anxiety- Will make sure on home doses of Benzo.  12/31- changed to home dose Ativan QID 0.5 mg 19. Respiratory insufficiency, history of non-vent dependent respiratory failure, recently switched from BiPAP to CPAP  12/30- on 5-6L high flow O2- will wean as tolerated  - will order continuous pulse ox for now  1/1- off O2 this AM while in gym.  1/2- was off for PT this AM- asking to go back on this AM- will get EKG per Cards  1/4- EKG looked good-  1/11- off O2 completely- just on CPAP when napping/sleeping.   20. Azotemia  12/30- BUN is 32 down from 40- will monitor; Cr 1.03 down from 1.32  1/5- Cr 1.16 and BUN 18- doing better  1/1- Labs in AM    1/11- Cr 1.0 and BUN 11- much improved- con't regimen 21. Likely AD episode today  12/31- paradoxical breathing, SOB, nasal congestion, anxiety- didn't c/o HA- likely due to constipation -resolved episode 22. Dispo- 1/8- pt said was tomorrow, but d/c scheduled for Tuesday.   1/11- d/c tomorrow  1/12- d/c today- doesn't want H/H or outpt therapy.Willl f/c with Dr Berline Chough in 1 month LOS: 14 days A FACE TO FACE EVALUATION WAS PERFORMED  Elice Crigger 10/17/2019, 9:53 AM

## 2019-10-17 NOTE — Progress Notes (Signed)
NIF/VC performed at this time. NIF greater than -40 cmH2O. VC 1.3L. Both performed with good effort.  Mat Carne RRT

## 2019-10-18 NOTE — Progress Notes (Signed)
Social Work Discharge Note   The overall goal for the admission was met for:   Discharge location: Yes  Length of Stay: Yes  Discharge activity level: Yes  Home/community participation: Yes  Services provided included: MD, RD, PT, OT, RN, Pharmacy, Neuropsych and SW  Financial Services: Medicare and Private Insurance: [employer]  Follow-up services arranged: Pt and wife declined any of the recommended HH follow up.    Comments (or additional information): Wife, Fausto Sampedro @ [phone]  Patient/Family verbalized understanding of follow-up arrangements: Yes  Individual responsible for coordination of the follow-up plan: pt  Confirmed correct DME delivered:  No DME needs    Milda Lindvall

## 2019-10-18 NOTE — Telephone Encounter (Signed)
Pt has been scheduled an appt with Dr. Vassie Loll 1/28. Nothing further needed.

## 2019-10-24 ENCOUNTER — Other Ambulatory Visit: Payer: Self-pay

## 2019-10-24 ENCOUNTER — Telehealth: Payer: Self-pay | Admitting: Pulmonary Disease

## 2019-10-24 ENCOUNTER — Encounter: Payer: Self-pay | Admitting: Primary Care

## 2019-10-24 ENCOUNTER — Ambulatory Visit (INDEPENDENT_AMBULATORY_CARE_PROVIDER_SITE_OTHER): Payer: Medicare Other | Admitting: Primary Care

## 2019-10-24 DIAGNOSIS — R0602 Shortness of breath: Secondary | ICD-10-CM

## 2019-10-24 DIAGNOSIS — I509 Heart failure, unspecified: Secondary | ICD-10-CM

## 2019-10-24 DIAGNOSIS — G4733 Obstructive sleep apnea (adult) (pediatric): Secondary | ICD-10-CM | POA: Diagnosis not present

## 2019-10-24 NOTE — Patient Instructions (Addendum)
Recommendations: We are changing CPAP pressure to see if this helps Monitor weight and leg swelling, recommend you take lasix 20mg  daily for 2 days and then as needed  Orders: Change pressure to 10-15 cm h20  CXR and labs in 1 week  Follow-up: Jan 28th with Dr. Jan 30 as previously scheduled

## 2019-10-24 NOTE — Progress Notes (Signed)
Virtual Visit via Telephone Note  I connected with [name] [name] on 10/24/19 at 10:30 AM EST by telephone and verified that I am speaking with the correct person using two identifiers.  Location: Patient: Home Provider: Office   I discussed the limitations, risks, security and privacy concerns of performing an evaluation and management service by telephone and the availability of in person appointments. I also discussed with the patient that there may be a patient responsible charge related to this service. The patient expressed understanding and agreed to proceed.   History of Present Illness: 69 year old male, never smoked. PMH significant for OSA, AFIB, CHF, HTN, myocardial infarction, GERD, hyperlipidemia, spinal stenosis. Patient of Dr. Vassie Loll, last seen by pulmonary NP on 08/01/19.   10/24/2019 Patient contacted today for acute televist. He was discharged from hospital on 10/17/19 following cervical discectomy and decompression for spinal stenosis. States that since surgery he is unable to lay flat d/t shortness of breath. He has a rare cough with clear mucus. Notes very little wheezing. Denies back pain. Nocturnal dyspnea was present while he was in the hospital and he would have to get out of bed and sit up in recliner. He is compliant with CPAP use at night and during the day when napping. Reports that his oxygen level has been between 93-94% on room air. He had a CXR recently that showed pulmonary vascular congestion and small pleural effusion. He is on lasix 20mg  as needed only, he last took diuretic two days ago. He is also on Spirnolactone 25mg  daily. He is unable to weigh himself as he does not have a scale. Wife does not think he is holding onto any fluid or that he has any significant leg swelling. BNP was 106 during hospital stay. Continue Eliquis. Denies significant cough, chest pain/tightness.   Airview download 07/26/19-10/23/19: Usage 69/90 days (77%) Average time used 10 hours 1  min Pressure 14 cm h20  Airleaks 38.2L/min (95%) AHI 5.9   Observations/Objective:  Patient reported- O2 94-96%  RA   - Able to speak in full sentences - No noticeable shortness of breath, wheezing or cough during phone conversation  Assessment and Plan:  OSA  - Experiencing nocturnal dyspnea when lying flat  - Compliant with CPAP use  - Current pressure 14cm h20; AHI 5.9 (moderate air leaks) - Change pressure to auto titrate 12-17cm h20  Diastolic congestive heart failure - Monitor daily weight and leg swelling - CXR 09/23/19 showed pulmonary vascular congestion and small right pleural effusion - Sniff tested on 10/02/19 showed no signs of diaphragmatic paralysis  - Take lasix 20mg  x 2 days and then prn; continue Spirnolactone 25mg  daily - Checking CXR and BNP (he will get these done before visit on 1.28)  Follow Up Instructions:     - Has hospital follow-up scheduled with Dr. 09/25/19 on 11/02/19  I discussed the assessment and treatment plan with the patient. The patient was provided an opportunity to ask questions and all were answered. The patient agreed with the plan and demonstrated an understanding of the instructions.   The patient was advised to call back or seek an in-person evaluation if the symptoms worsen or if the condition fails to improve as anticipated.  I provided 25 minutes of non-face-to-face time during this encounter.   , NP

## 2019-10-24 NOTE — Telephone Encounter (Signed)
Spoke with pt. States that he is still having issues with shortness of breath. Pt was released from the hospital on 10/17/19. Reports that he can't lay down flat without becoming extremely short of breath. I have scheduled the pt for a televisit with [name] today at 1030. Nothing further was needed.

## 2019-10-26 ENCOUNTER — Telehealth: Payer: Self-pay | Admitting: Cardiology

## 2019-10-26 ENCOUNTER — Emergency Department (HOSPITAL_COMMUNITY)
Admission: EM | Admit: 2019-10-26 | Discharge: 2019-10-26 | Disposition: A | Discharge status: Home / Self Care | Payer: Medicare Other | Attending: Emergency Medicine | Admitting: Emergency Medicine

## 2019-10-26 ENCOUNTER — Emergency Department (HOSPITAL_COMMUNITY): Payer: Medicare Other

## 2019-10-26 ENCOUNTER — Other Ambulatory Visit: Payer: Self-pay

## 2019-10-26 ENCOUNTER — Encounter (HOSPITAL_COMMUNITY): Payer: Self-pay | Admitting: Emergency Medicine

## 2019-10-26 DIAGNOSIS — E669 Obesity, unspecified: Secondary | ICD-10-CM | POA: Diagnosis not present

## 2019-10-26 DIAGNOSIS — I509 Heart failure, unspecified: Secondary | ICD-10-CM | POA: Insufficient documentation

## 2019-10-26 DIAGNOSIS — Z96652 Presence of left artificial knee joint: Secondary | ICD-10-CM | POA: Insufficient documentation

## 2019-10-26 DIAGNOSIS — Z09 Encounter for follow-up examination after completed treatment for conditions other than malignant neoplasm: Secondary | ICD-10-CM | POA: Diagnosis not present

## 2019-10-26 DIAGNOSIS — I252 Old myocardial infarction: Secondary | ICD-10-CM | POA: Insufficient documentation

## 2019-10-26 DIAGNOSIS — Z6836 Body mass index (BMI) 36.0-36.9, adult: Secondary | ICD-10-CM | POA: Insufficient documentation

## 2019-10-26 DIAGNOSIS — I25110 Atherosclerotic heart disease of native coronary artery with unstable angina pectoris: Secondary | ICD-10-CM | POA: Diagnosis not present

## 2019-10-26 DIAGNOSIS — R0602 Shortness of breath: Secondary | ICD-10-CM | POA: Insufficient documentation

## 2019-10-26 DIAGNOSIS — Z951 Presence of aortocoronary bypass graft: Secondary | ICD-10-CM | POA: Insufficient documentation

## 2019-10-26 DIAGNOSIS — I110 Hypertensive heart disease with heart failure: Secondary | ICD-10-CM | POA: Insufficient documentation

## 2019-10-26 DIAGNOSIS — Z8616 Personal history of COVID-19: Secondary | ICD-10-CM | POA: Insufficient documentation

## 2019-10-26 DIAGNOSIS — Z20822 Contact with and (suspected) exposure to covid-19: Secondary | ICD-10-CM | POA: Insufficient documentation

## 2019-10-26 DIAGNOSIS — R000 Tachycardia, unspecified: Secondary | ICD-10-CM | POA: Diagnosis not present

## 2019-10-26 DIAGNOSIS — Z79899 Other long term (current) drug therapy: Secondary | ICD-10-CM | POA: Diagnosis not present

## 2019-10-26 LAB — CBC WITH DIFFERENTIAL/PLATELET
Abs Immature Granulocytes: 0.14 10*3/uL — ABNORMAL HIGH (ref 0.00–0.07)
Basophils Absolute: 0.1 10*3/uL (ref 0.0–0.1)
Basophils Relative: 0 %
Eosinophils Absolute: 0 10*3/uL (ref 0.0–0.5)
Eosinophils Relative: 0 %
HCT: 41.8 % (ref 39.0–52.0)
Hemoglobin: 13.7 g/dL (ref 13.0–17.0)
Immature Granulocytes: 1 %
Lymphocytes Relative: 3 %
Lymphs Abs: 0.6 10*3/uL — ABNORMAL LOW (ref 0.7–4.0)
MCH: 28.7 pg (ref 26.0–34.0)
MCHC: 32.8 g/dL (ref 30.0–36.0)
MCV: 87.4 fL (ref 80.0–100.0)
Monocytes Absolute: 0.8 10*3/uL (ref 0.1–1.0)
Monocytes Relative: 4 %
Neutro Abs: 18.7 10*3/uL — ABNORMAL HIGH (ref 1.7–7.7)
Neutrophils Relative %: 92 %
Platelets: 345 10*3/uL (ref 150–400)
RBC: 4.78 MIL/uL (ref 4.22–5.81)
RDW: 14.1 % (ref 11.5–15.5)
WBC: 20.3 10*3/uL — ABNORMAL HIGH (ref 4.0–10.5)
nRBC: 0 % (ref 0.0–0.2)

## 2019-10-26 LAB — I-STAT CHEM 8, ED
BUN: 12 mg/dL (ref 8–23)
Calcium, Ion: 1.11 mmol/L — ABNORMAL LOW (ref 1.15–1.40)
Chloride: 105 mmol/L (ref 98–111)
Creatinine, Ser: 0.9 mg/dL (ref 0.61–1.24)
Glucose, Bld: 112 mg/dL — ABNORMAL HIGH (ref 70–99)
HCT: 42 % (ref 39.0–52.0)
Hemoglobin: 14.3 g/dL (ref 13.0–17.0)
Potassium: 3.9 mmol/L (ref 3.5–5.1)
Sodium: 138 mmol/L (ref 135–145)
TCO2: 22 mmol/L (ref 22–32)

## 2019-10-26 LAB — HEPATIC FUNCTION PANEL
ALT: 22 U/L (ref 0–44)
AST: 21 U/L (ref 15–41)
Albumin: 3 g/dL — ABNORMAL LOW (ref 3.5–5.0)
Alkaline Phosphatase: 108 U/L (ref 38–126)
Bilirubin, Direct: 0.1 mg/dL (ref 0.0–0.2)
Indirect Bilirubin: 0.5 mg/dL (ref 0.3–0.9)
Total Bilirubin: 0.6 mg/dL (ref 0.3–1.2)
Total Protein: 7.3 g/dL (ref 6.5–8.1)

## 2019-10-26 LAB — RESPIRATORY PANEL BY RT PCR (FLU A&B, COVID)
Influenza A by PCR: NEGATIVE
Influenza B by PCR: NEGATIVE
SARS Coronavirus 2 by RT PCR: NEGATIVE

## 2019-10-26 MED ORDER — SODIUM CHLORIDE 0.9 % IV SOLN: INTRAVENOUS | Status: DC

## 2019-10-26 MED ORDER — IOHEXOL 350 MG/ML SOLN
100.0000 mL | Freq: Once | INTRAVENOUS | Status: AC | PRN
  Administered 2019-10-26: 19:00:00 100 mL via INTRAVENOUS

## 2019-10-26 MED ORDER — AMOXICILLIN-POT CLAVULANATE 875-125 MG PO TABS: 1.0000 | ORAL_TABLET | Freq: Two times a day (BID) | ORAL | 0 refills | Status: DC

## 2019-10-26 MED ORDER — LORAZEPAM 2 MG/ML IJ SOLN
0.5000 mg | Freq: Once | INTRAMUSCULAR | Status: AC
  Administered 2019-10-26: 18:00:00 0.5 mg via INTRAVENOUS
  Filled 2019-10-26: qty 1

## 2019-10-26 MED ORDER — AMOXICILLIN-POT CLAVULANATE 875-125 MG PO TABS
1.0000 | ORAL_TABLET | Freq: Once | ORAL | Status: AC
  Administered 2019-10-26: 1 via ORAL
  Filled 2019-10-26: qty 1

## 2019-10-26 NOTE — Telephone Encounter (Signed)
Pt wife voiced understanding - will give lasix 60 mg now and continue to monitor symptoms

## 2019-10-26 NOTE — Telephone Encounter (Signed)
Patient just got home from [facility] and is having breathing problems wanted to speak with Staci to see what he needs to do

## 2019-10-26 NOTE — Discharge Instructions (Addendum)
A CT scan of your chest indicates that your left lower lung, is not expanding fully, or has an infection in it.  We are giving you an antibiotic in case there is an infection.  It is important to follow-up with your cardiologist, and pulmonologist, as scheduled.  Continue using your CPAP.  We sent a prescription to your pharmacy, to start taking tomorrow morning.  Use Tylenol if needed for fever.

## 2019-10-26 NOTE — Telephone Encounter (Signed)
Pt c/o increased SOB/swelling in feet and stomach/weakness/nauseated - O2 was 96 % HR 108 since yesterday - has taken 20 mg lasix that hasn't helped - wife explained symptoms as pt was to SOB to talk on phone - currently on recliner as he can't lay flat - wife requesting for pt to be seen today (doesn't want to go back to ED with COVID) scheduled with Dr Wyline Mood tomorrow in [place] and will forward to provider

## 2019-10-26 NOTE — ED Provider Notes (Signed)
[facility] EMERGENCY DEPARTMENT Provider Note   CSN: [id number] Arrival date & time: 10/26/19  1516     History Chief Complaint  Patient presents with   Shortness of Breath    [name] [name] is a 69 y.o. male.  HPI He presents for evaluation of worsening shortness of breath for 3 days and the feeling that he is not getting enough oxygen.  He monitors his oxygen saturations at home and they have been running between 92 and 95%.  He is on CPAP at night, but has been using it during the day to help him breathe.  He does not use bronchodilators.  He denies recent fever, chills, cough, exposure to COVID-19.  He denies chest pain, nausea, vomiting, diaphoresis or weakness.  There are no other known modifying factors.  Patient was seen by pulmonology service, by televisit, on 10/23/2019.  At that time he was advised to take Lasix 20 mg for 2 days then use as needed, for fluid retention.  He was advised to continue spironolactone 25 mg daily.  He was set up for an appointment with his pulmonologist on 11/02/2019.  Past Medical History:  Diagnosis Date   Anxiety    CHF (congestive heart failure) (HCC)    h/o due to fluid overload in recovery room   Coronary artery disease    2v CABG, 2004 ([facility])   COVID-19    DJD (degenerative joint disease)    Dyspnea    on exertion   GERD (gastroesophageal reflux disease)    History of kidney stones    3 stones yrs. ago   HLD (hyperlipidemia)    Hypertension    MI, old 2004   OA (osteoarthritis)    Obesity    Persistent atrial fibrillation (HCC)    failed medical therapy with sotalol, s/p PVI x 2   Sleep apnea    on CPAP   Spinal stenosis    Typical atrial flutter [facility])     Patient Active Problem List   Diagnosis Date Noted   Cervical myelopathy (HCC) 10/03/2019   Myelopathy concurrent with and due to spinal stenosis of cervical region [facility]) 09/21/2019   Paroxysmal atrial fibrillation (HCC) 05/24/2018   Unstable  angina (HCC) 02/04/2018   Typical atrial flutter (HCC)    Spinal stenosis    Sleep apnea    QT prolongation    Palpitations    PAF (paroxysmal atrial fibrillation) (HCC)    OA (osteoarthritis)    Hypertension    HLD (hyperlipidemia)    Edema    DJD (degenerative joint disease)    Coronary artery disease    Chronic chest pain    Hx of total knee replacement, 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   A-fib (HCC) 09/15/2016   Chest pain 09/09/2016   Obesity 08/22/2016   Anxiety state 08/22/2016   Encounter for monitoring sotalol therapy 08/20/2016   Morbidly obese (HCC) 07/12/2016   Medical non-compliance 07/12/2016   Elevated TSH 07/09/2016   BPH without urinary obstruction 07/09/2016   Need for hepatitis C screening test 07/09/2016   Idiopathic gout 07/09/2016   Hyperglycemia 08/01/2015   Atrial fibrillation with RVR (HCC) 03/28/2015   Routine general medical examination at a health care facility 08/11/2013   Chronic sinus bradycardia    Obstructive sleep apnea 09/12/2010   MYOCARDIAL INFARCTION 09/11/2010   Spinal stenosis, unspecified region other than cervical 09/11/2010   Hyperlipidemia with target LDL less than 70 09/08/2010   Depression 09/08/2010   Essential hypertension 09/08/2010   Coronary  atherosclerosis 09/08/2010   Congestive heart failure ([place]) 09/08/2010   GERD 09/08/2010   MI, old 10/05/2002    Past Surgical History:  Procedure Laterality Date   ANTERIOR CERVICAL DECOMP/DISCECTOMY FUSION [name] 09/21/2019   Procedure: ANTERIOR CERVICAL DECOMPRESSION/DISCECTOMY FUSION CERVICAL THREE- CERVICAL FOUR, CERVICAL FOUR- CERVICAL FIVE, CERVICAL FIVE- CERVICAL SIX;  Surgeon: Newman Pies, MD;  Location: [facility];  Service: Neurosurgery;  Laterality: [name];  ANTERIOR CERVICAL DECOMPRESSION/DISCECTOMY FUSION CERVICAL THREE- CERVICAL FOUR, CERVICAL FOUR- CERVICAL FIVE, CERVICAL FIVE- CERVICAL SIX   ATRIAL FIBRILLATION ABLATION [name] 05/24/2018   Procedure:  ATRIAL FIBRILLATION ABLATION;  Surgeon: Thompson Grayer, MD;  Location: [place] [facility];  Service: Cardiovascular;  Laterality: [name];   BACK SURGERY  2011   CARPAL TUNNEL RELEASE     bilateral   CHOLECYSTECTOMY     CORONARY ARTERY BYPASS GRAFT     2004   ELECTROPHYSIOLOGIC STUDY [name] 09/15/2016   Procedure: Atrial Fibrillation Ablation;  Surgeon: Thompson Grayer, MD;  Location: [place] [facility];  Service: Cardiovascular;  Laterality: [name];   fistula surgery     hemorrhoidectomy     LEFT HEART CATH AND CORS/GRAFTS ANGIOGRAPHY [name] 02/07/2018   Procedure: LEFT HEART CATH AND CORS/GRAFTS ANGIOGRAPHY;  Surgeon: Martinique, Peter M, MD;  Location: [place] [facility];  Service: Cardiovascular;  Laterality: [name];   LUMBAR LAMINECTOMY     right foot fracture     TEE WITHOUT CARDIOVERSION [name] 09/15/2016   Procedure: TRANSESOPHAGEAL ECHOCARDIOGRAM (TEE);  Surgeon: Lelon Perla, MD;  Location: [facility];  Service: Cardiovascular;  Laterality: [name];   TOTAL KNEE ARTHROPLASTY Left 07/07/2017   Procedure: LEFT TOTAL KNEE ARTHROPLASTY;  Surgeon: Latanya Maudlin, MD;  Location: [facility];  Service: Orthopedics;  Laterality: Left;   TRANSTHORACIC ECHOCARDIOGRAM  08/25/2010   EF 55-60%       Family History  Problem Relation Age of Onset   Breast cancer Mother    Hypertension Mother    Coronary artery disease Father    Stroke Father    Hypertension Father     Social History   Tobacco Use   Smoking status: Never Smoker   Smokeless tobacco: Never Used  Substance Use Topics   Alcohol use: Yes    Alcohol/week: 0.0 standard drinks    Comment: ocassional   Drug use: No    Home Medications Prior to Admission medications   Medication Sig Start Date End Date Taking? Authorizing Provider  acetaminophen (TYLENOL) 325 MG tablet Take 2 tablets (650 mg total) by mouth every 4 (four) hours as needed for mild pain ((score 1 to 3) or temp > 100.5). 10/16/19  Yes Angiulli, Lavon Paganini, PA-C    apixaban (ELIQUIS) 5 MG TABS tablet Take 1 tablet (5 mg total) by mouth 2 (two) times daily. 10/16/19  Yes Angiulli, Lavon Paganini, PA-C  atorvastatin (LIPITOR) 20 MG tablet Take 1 tablet (20 mg total) by mouth daily. 10/16/19  Yes Angiulli, Lavon Paganini, PA-C  Coenzyme Q10 (CO Q 10 PO) Take 400 mg by mouth daily.    Yes [provider]  cyclobenzaprine (FLEXERIL) 10 MG tablet Take 1 tablet (10 mg total) by mouth 3 (three) times daily as needed for muscle spasms. 10/16/19  Yes Angiulli, Lavon Paganini, PA-C  diltiazem (CARDIZEM CD) 180 MG 24 hr capsule Take 1 capsule (180 mg total) by mouth daily. 10/16/19 01/14/20 Yes Angiulli, Lavon Paganini, PA-C  docusate sodium (COLACE) 100 MG capsule Take 1 capsule (100 mg total) by mouth 2 (two) times daily. 10/03/19  Yes Bergman, Meghan D, NP  furosemide (LASIX) 20 MG tablet TAKE 1 TABLET BY MOUTH DAILY AS NEEDED FOR SWELLING/WEIGHT GAIN Patient taking differently: Take 60 mg by mouth daily.  09/21/19  Yes BranchDorothe Pea, MD  hydrALAZINE (APRESOLINE) 50 MG tablet Take 1.5 tablets (75 mg total) by mouth 3 (three) times daily. 10/16/19  Yes Angiulli, Mcarthur Rossetti, PA-C  LORazepam (ATIVAN) 0.5 MG tablet Take 1 tablet (0.5 mg total) by mouth 4 (four) times daily. 10/16/19  Yes Angiulli, Mcarthur Rossetti, PA-C  metoprolol tartrate (LOPRESSOR) 25 MG tablet Take 0.5 tablets (12.5 mg total) by mouth 2 (two) times daily. 10/16/19  Yes Angiulli, Mcarthur Rossetti, PA-C  Multiple Vitamin (MULTIVITAMIN) tablet Take 1 tablet by mouth daily.   Yes [provider]  nitroGLYCERIN (NITROSTAT) 0.4 MG SL tablet Place 1 tablet (0.4 mg total) under the tongue every 5 (five) minutes x 3 doses as needed for chest pain. 02/07/18  Yes Laverda Page B, NP  omeprazole (PRILOSEC) 20 MG capsule TAKE 1 CAPSULE BY MOUTH EVERY OTHER DAY. 10/16/19  Yes Angiulli, Mcarthur Rossetti, PA-C  Oxycodone HCl 10 MG TABS Take 1 tablet (10 mg total) by mouth every 4 (four) hours as needed ((score 7 to 10)). 10/16/19  Yes Angiulli, Mcarthur Rossetti, PA-C  PRESCRIPTION MEDICATION Pt uses CPAP machine at bedtime   Yes [provider]  sertraline (ZOLOFT) 50 MG tablet Take 1 tablet (50 mg total) by mouth daily. 10/16/19  Yes Angiulli, Mcarthur Rossetti, PA-C  spironolactone (ALDACTONE) 25 MG tablet Take 1 tablet (25 mg total) by mouth daily. 10/17/19  Yes Angiulli, Mcarthur Rossetti, PA-C  tamsulosin (FLOMAX) 0.4 MG CAPS capsule Take 2 capsules (0.8 mg total) by mouth daily. 10/16/19  Yes Angiulli, Mcarthur Rossetti, PA-C  amoxicillin-clavulanate (AUGMENTIN) 875-125 MG tablet Take 1 tablet by mouth 2 (two) times daily. One po bid x 7 days 10/26/19   Mancel Bale, MD    Allergies    Vancomycin, Aldactone [spironolactone], Chlorthalidone, and Nsaids  Review of Systems   Review of Systems  All other systems reviewed and are negative.   Physical Exam Updated Vital Signs BP (!) 165/84    Pulse 99    Temp 98.7 F (37.1 C) (Oral)    Resp (!) 25    Ht 5\' 6"  (1.676 m)    Wt 103.5 kg    SpO2 96%    BMI 36.83 kg/m   Physical Exam Vitals and nursing note reviewed.  Constitutional:      General: He is not in acute distress.    Appearance: He is well-developed. He is obese. He is not ill-appearing, toxic-appearing or diaphoretic.  HENT:     Head: Normocephalic and atraumatic.     Right Ear: External ear normal.     Left Ear: External ear normal.  Eyes:     Conjunctiva/sclera: Conjunctivae normal.     Pupils: Pupils are equal, round, and reactive to light.  Neck:     Trachea: Phonation normal.  Cardiovascular:     Rate and Rhythm: Normal rate and regular rhythm.     Heart sounds: Normal heart sounds.  Pulmonary:     Effort: Pulmonary effort is normal. No tachypnea, accessory muscle usage or respiratory distress.     Breath sounds: No stridor. Examination of the right-middle field reveals decreased breath sounds. Examination of the left-middle field reveals decreased breath sounds. Examination of the right-lower field reveals decreased breath sounds.  Examination of the left-lower field reveals decreased breath sounds.  Decreased breath sounds present. No wheezing, rhonchi or rales.  Chest:     Chest wall: No mass, deformity or tenderness.  Abdominal:     Palpations: Abdomen is soft.     Tenderness: There is no abdominal tenderness. There is no guarding.  Musculoskeletal:        General: Normal range of motion.     Cervical back: Normal range of motion and neck supple.     Right lower leg: Edema present.     Left lower leg: Edema present.  Skin:    General: Skin is warm and dry.     Coloration: Skin is not cyanotic or pale.  Neurological:     Mental Status: He is alert and oriented to person, place, and time.     Cranial Nerves: No cranial nerve deficit.     Sensory: No sensory deficit.     Motor: No abnormal muscle tone.     Coordination: Coordination normal.  Psychiatric:        Mood and Affect: Mood normal.        Behavior: Behavior normal.        Thought Content: Thought content normal.        Judgment: Judgment normal.     ED Results / Procedures / Treatments   Labs (all labs ordered are listed, but only abnormal results are displayed) Labs Reviewed  CBC WITH DIFFERENTIAL/PLATELET - Abnormal; Notable for the following components:      Result Value   WBC 20.3 (*)    Neutro Abs 18.7 (*)    Lymphs Abs 0.6 (*)    Abs Immature Granulocytes 0.14 (*)    All other components within normal limits  HEPATIC FUNCTION PANEL - Abnormal; Notable for the following components:   Albumin 3.0 (*)    All other components within normal limits  I-STAT CHEM 8, ED - Abnormal; Notable for the following components:   Glucose, Bld 112 (*)    Calcium, Ion 1.11 (*)    All other components within normal limits  RESPIRATORY PANEL BY RT PCR (FLU A&B, COVID)  URINALYSIS, ROUTINE W REFLEX MICROSCOPIC    EKG EKG Interpretation  Date/Time:  Thursday October 26 2019 15:55:50 EST Ventricular Rate:  101 PR Interval:    QRS Duration: 104 QT  Interval:  336 QTC Calculation: 436 R Axis:   103 Text Interpretation: Sinus tachycardia Low voltage, precordial leads Probable right ventricular hypertrophy Inferior Q waves noted since last tracing no significant change Confirmed by Mancel Bale [phone]) on 10/26/2019 4:24:23 PM   Radiology CT Angio Chest PE W/Cm &/Or Wo Cm  Result Date: 10/26/2019 CLINICAL DATA:  69 year old male with shortness of breath. EXAM: CT ANGIOGRAPHY CHEST WITH CONTRAST TECHNIQUE: Multidetector CT imaging of the chest was performed using the standard protocol during bolus administration of intravenous contrast. Multiplanar CT image reconstructions and MIPs were obtained to evaluate the vascular anatomy. CONTRAST:  OMNIPAQUE IOHEXOL 350 MG/ML SOLN COMPARISON:  Chest CT dated 10/13/2019 and radiograph dated 10/26/2019 FINDINGS: Evaluation is limited due to streak artifact caused by patient's arms as well as secondary to respiratory motion artifact. Cardiovascular: There is mild cardiomegaly. No pericardial effusion. Three-vessel coronary vascular calcification and postsurgical changes of CABG. There is mild atherosclerotic calcification of the thoracic aorta. No aneurysmal dilatation or dissection. The origins of the great vessels of the aortic arch appear patent as visualized. Evaluation of the pulmonary arteries is limited due to severe respiratory motion artifact. No large central pulmonary artery embolus  identified. Mediastinum/Nodes: There is no hilar or mediastinal adenopathy. The esophagus and the thyroid gland are grossly unremarkable as visualized. No mediastinal fluid collection. Lungs/Pleura: There is mild eventration of the left hemidiaphragm. Large area of consolidative change involving the left lower lobe appears similar to prior CT and may represent atelectasis or infiltrate. A mass is not entirely excluded. Clinical correlation and follow-up recommended. Slight interval improvement of the patchy and linear  right lung base consolidative change compared to the prior CT. There is no pleural effusion or pneumothorax. The central airways are patent. Upper Abdomen: Probable fatty infiltration of the liver. Musculoskeletal: Median sternotomy wires. No acute osseous pathology. Partially visualized lower cervical ACDF. Review of the MIP images confirms the above findings. IMPRESSION: 1. No CT evidence of large central pulmonary artery embolus. 2. Large area of consolidative change involving the left lower lobe similar to prior CT, likely atelectasis or infiltrate. Clinical correlation and follow-up recommended. 3. Interval improvement of the right lung base densities since the prior CT. 4. Coronary vascular calcification and postsurgical changes of CABG. Electronically Signed   By: Elgie Collard M.D.   On: 10/26/2019 19:52   DG Chest Port 1 View  Result Date: 10/26/2019 CLINICAL DATA:  69 year old male with shortness of breath. EXAM: PORTABLE CHEST 1 VIEW COMPARISON:  Chest radiograph dated 10/03/2019 and CT dated 10/13/2019 FINDINGS: There is shallow inspiration with bibasilar atelectasis. Slight interval improvement in the aeration of the lung bases compared to the prior radiograph pneumonia is not excluded. Clinical correlation is recommended. No large pleural effusion or pneumothorax. Stable cardiomediastinal silhouette. Median sternotomy wires and CABG vascular clips. No acute osseous pathology. IMPRESSION: Shallow inspiration with bibasilar atelectasis. Slight interval improvement in the aeration of the lung bases compared to the prior radiograph. Electronically Signed   By: Elgie Collard M.D.   On: 10/26/2019 16:37    Procedures Procedures (including critical care time)  Medications Ordered in ED Medications  0.9 %  sodium chloride infusion ( Intravenous New Bag/Given 10/26/19 1730)  amoxicillin-clavulanate (AUGMENTIN) 875-125 MG per tablet 1 tablet (has no administration in time range)  LORazepam  (ATIVAN) injection 0.5 mg (0.5 mg Intravenous Given 10/26/19 1737)  iohexol (OMNIPAQUE) 350 MG/ML injection 100 mL (100 mLs Intravenous Contrast Given 10/26/19 1920)    ED Course  I have reviewed the triage vital signs and the nursing notes.  Pertinent labs & imaging results that were available during my care of the patient were reviewed by me and considered in my medical decision making (see chart for details).  Clinical Course as of Oct 25 2106  Thu Oct 26, 2019  1625 No infiltrate or CHF, interpreted by me  DG Chest [facility] [EW]  2048 Normal except glucose high, calcium low  I-stat chem 8, ED (not at [facility] or [facility])(!) [EW]  2048 Normal except albumin low  Hepatic function panel(!) [EW]  2048 Normal except white count high  CBC with Differential(!) [EW]    Clinical Course User Index [EW] Mancel Bale, MD   MDM Rules/Calculators/A&P                       Patient Vitals for the past 24 hrs:  BP Temp Temp src Pulse Resp SpO2 Height Weight  10/26/19 2100 (!) 165/84 -- -- 99 (!) 25 96 % -- --  10/26/19 2030 (!) 147/66 -- -- 100 (!) 32 95 % -- --  10/26/19 2000 (!) 163/81 -- -- (!) 101 (!) 31  95 % -- --  10/26/19 1900 (!) 171/72 -- -- 95 (!) 26 93 % -- --  10/26/19 1800 139/71 -- -- 84 (!) 21 96 % -- --  10/26/19 1740 -- -- -- 90 (!) 32 100 % -- --  10/26/19 1738 129/80 -- -- -- (!) 34 -- -- --  10/26/19 1730 -- -- -- 89 (!) 25 100 % -- --  10/26/19 1728 -- -- -- 91 (!) 30 97 % -- --  10/26/19 1704 -- -- -- 91 (!) 34 96 % -- --  10/26/19 1700 -- -- -- 94 (!) 32 96 % -- --  10/26/19 1640 -- -- -- 93 (!) 29 96 % -- --  10/26/19 1630 -- -- -- 95 (!) 39 97 % -- --  10/26/19 1600 125/69 -- -- 96 (!) 27 95 % -- --  10/26/19 1556 124/74 -- -- 98 (!) 32 94 % -- --  10/26/19 1526 -- -- -- -- -- -- 5\' 6"  (1.676 m) 103.5 kg  10/26/19 1525 127/80 98.7 F (37.1 C) Oral (!) 101 16 94 % -- --    9:08 PM Reevaluation with update and discussion. After initial assessment and treatment,  an updated evaluation reveals he states he is more comfortable at this time.  Findings discussed with the patient as well as wife by telephone.  All questions answered. Mancel Bale   Medical Decision Making: Nonspecific shortness of breath with abnormal chest CT, essentially unchanged from 13 days ago.  White blood cell count elevated.  No respiratory distress.  Mild tachypnea is present.  CT does not indicate pulmonary embolus.  Doubt ACS or metabolic instability.  Patient has short-term follow-up planned with cardiology tomorrow and pulmonology, in 1 week.  He is stable for discharge.  CRITICAL CARE-no Performed by: Mancel Bale   Nursing Notes Reviewed/ Care Coordinated Applicable Imaging Reviewed Interpretation of Laboratory Data incorporated into ED treatment  The patient appears reasonably screened and/or stabilized for discharge and I doubt any other medical condition or other [facility] requiring further screening, evaluation, or treatment in the ED at this time prior to discharge.  Plan: Home Medications-continue usual medications; Home Treatments-rest, fluids; return here if the recommended treatment, does not improve the symptoms; Recommended follow up-PCP, as needed.  Cardiology tomorrow as scheduled.  Pulmonary in 1 week as scheduled.   Final Clinical Impression(s) / ED Diagnoses Final diagnoses:  Shortness of breath    Rx / DC Orders ED Discharge Orders         Ordered    amoxicillin-clavulanate (AUGMENTIN) 875-125 MG tablet  2 times daily     10/26/19 2108           Mancel Bale, MD 10/26/19 2114

## 2019-10-26 NOTE — ED Triage Notes (Signed)
Patient complains of shortness of breath that began three days ago and has gotten progressively worse. Patient was discharge from having neck surgery on January 12th. Oxygen saturations of 95 percent , but labored.

## 2019-10-26 NOTE — Telephone Encounter (Signed)
Take 60mg  of lasix now. If severe symptoms throughout today needs to go to ER, otherwise will eval tomorrow. What is his weight.    MD

## 2019-10-27 ENCOUNTER — Ambulatory Visit (INDEPENDENT_AMBULATORY_CARE_PROVIDER_SITE_OTHER): Payer: Medicare Other | Admitting: Cardiology

## 2019-10-27 ENCOUNTER — Encounter: Payer: Self-pay | Admitting: Cardiology

## 2019-10-27 ENCOUNTER — Telehealth: Payer: Self-pay | Admitting: Cardiology

## 2019-10-27 VITALS — BP 129/72 | HR 87 | Wt 228.0 lb

## 2019-10-27 DIAGNOSIS — I5033 Acute on chronic diastolic (congestive) heart failure: Secondary | ICD-10-CM | POA: Diagnosis not present

## 2019-10-27 DIAGNOSIS — R0602 Shortness of breath: Secondary | ICD-10-CM

## 2019-10-27 DIAGNOSIS — I2510 Atherosclerotic heart disease of native coronary artery without angina pectoris: Secondary | ICD-10-CM | POA: Diagnosis not present

## 2019-10-27 MED ORDER — FUROSEMIDE 20 MG PO TABS: 60.0000 mg | ORAL_TABLET | Freq: Every day | ORAL | 3 refills | Status: DC

## 2019-10-27 NOTE — Telephone Encounter (Signed)

## 2019-10-27 NOTE — Patient Instructions (Addendum)
Medication Instructions:  INCREASE LASIX TO 60 MG DAILY   STOP ALDACTONE   Labwork: BMET MAGNESIUM   Testing/Procedures: NONE  Follow-Up: Your physician recommends that you schedule a follow-up appointment in: 3 WEEKS    Any Other Special Instructions Will Be Listed Below (If Applicable).  PLEASE CALL ON Monday TO UPDATE ON  WEIGHTS & SWELLING    If you need a refill on your cardiac medications before your next appointment, please call your pharmacy.

## 2019-10-27 NOTE — Progress Notes (Signed)
Clinical Summary [name] is a 69 y.o.male seen today for follow up of the following medical problems.This is a focused visit on recent issues with SOB.    1. SOB - no cough or wheezing - mild LE edema, does not have a scale unsure if he gained any weight - DOE with mild activities. No orthopnea. Severe fatigue. Reports a diagnosis of COVID 1 month over 1 month ago   - admit 09/2019. Had presented for neck surgery. Postop issues with respiratory distress requiring bipap. - from notes thought to be multifactorial incluiding postop swelling/neck hematoma, volume overload, possibly diaphragmatic injury.     - seen in ER last night for SOB - COVID and flu negative. WBC 20 (chronically elevated over last few weks)  Hgb 13.7 Plt 345  CXR no acute process CT PE no PE. Large LLL consolidation vs atelectasis. - started on oral abx for possible pneumonia  - has had some increased LE edema - sleeps in recliner due to orthopnea - occasional cough, clear sputum at times.       Past Medical History:  Diagnosis Date  . Anxiety   . CHF (congestive heart failure) (HCC)    h/o due to fluid overload in recovery room  . Coronary artery disease    2v CABG, 2004 [facility])  . COVID-19   . DJD (degenerative joint disease)   . Dyspnea    on exertion  . GERD (gastroesophageal reflux disease)   . History of kidney stones    3 stones yrs. ago  . HLD (hyperlipidemia)   . Hypertension   . MI, old 2004  . OA (osteoarthritis)   . Obesity   . Persistent atrial fibrillation ([place])    failed medical therapy with sotalol, s/p PVI x 2  . Sleep apnea    on CPAP  . Spinal stenosis   . Typical atrial flutter (HCC)      Allergies  Allergen Reactions  . Vancomycin Anaphylaxis, Shortness Of Breath and Other (See Comments)    Was informed after heart surgery Breathing problem patient unsure  . Aldactone [Spironolactone]     GYNECOMASTIA   . Chlorthalidone Other (See Comments)    GOUT    . Nsaids Other (See Comments)    On blood thinner     Current Outpatient Medications  Medication Sig Dispense Refill  . acetaminophen (TYLENOL) 325 MG tablet Take 2 tablets (650 mg total) by mouth every 4 (four) hours as needed for mild pain ((score 1 to 3) or temp > 100.5).    Marland Kitchen amoxicillin-clavulanate (AUGMENTIN) 875-125 MG tablet Take 1 tablet by mouth 2 (two) times daily. One po bid x 7 days 14 tablet 0  . apixaban (ELIQUIS) 5 MG TABS tablet Take 1 tablet (5 mg total) by mouth 2 (two) times daily. 42 tablet 0  . atorvastatin (LIPITOR) 20 MG tablet Take 1 tablet (20 mg total) by mouth daily. 90 tablet 1  . Coenzyme Q10 (CO Q 10 PO) Take 400 mg by mouth daily.     . cyclobenzaprine (FLEXERIL) 10 MG tablet Take 1 tablet (10 mg total) by mouth 3 (three) times daily as needed for muscle spasms. 60 tablet 0  . diltiazem (CARDIZEM CD) 180 MG 24 hr capsule Take 1 capsule (180 mg total) by mouth daily. 90 capsule 3  . docusate sodium (COLACE) 100 MG capsule Take 1 capsule (100 mg total) by mouth 2 (two) times daily. 10 capsule 0  . furosemide (LASIX)  20 MG tablet TAKE 1 TABLET BY MOUTH DAILY AS NEEDED FOR SWELLING/WEIGHT GAIN (Patient taking differently: Take 60 mg by mouth daily. ) 90 tablet 1  . hydrALAZINE (APRESOLINE) 50 MG tablet Take 1.5 tablets (75 mg total) by mouth 3 (three) times daily. 405 tablet 1  . LORazepam (ATIVAN) 0.5 MG tablet Take 1 tablet (0.5 mg total) by mouth 4 (four) times daily. 120 tablet 0  . metoprolol tartrate (LOPRESSOR) 25 MG tablet Take 0.5 tablets (12.5 mg total) by mouth 2 (two) times daily. 60 tablet 1  . Multiple Vitamin (MULTIVITAMIN) tablet Take 1 tablet by mouth daily.    . nitroGLYCERIN (NITROSTAT) 0.4 MG SL tablet Place 1 tablet (0.4 mg total) under the tongue every 5 (five) minutes x 3 doses as needed for chest pain. 25 tablet 1  . omeprazole (PRILOSEC) 20 MG capsule TAKE 1 CAPSULE BY MOUTH EVERY OTHER DAY. 45 capsule 3  . Oxycodone HCl 10 MG TABS Take 1  tablet (10 mg total) by mouth every 4 (four) hours as needed ((score 7 to 10)). 30 tablet 0  . PRESCRIPTION MEDICATION Pt uses CPAP machine at bedtime    . sertraline (ZOLOFT) 50 MG tablet Take 1 tablet (50 mg total) by mouth daily. 30 tablet 1  . spironolactone (ALDACTONE) 25 MG tablet Take 1 tablet (25 mg total) by mouth daily. 30 tablet 1  . tamsulosin (FLOMAX) 0.4 MG CAPS capsule Take 2 capsules (0.8 mg total) by mouth daily. 60 capsule 0   No current facility-administered medications for this visit.     Past Surgical History:  Procedure Laterality Date  . ANTERIOR CERVICAL DECOMP/DISCECTOMY FUSION [name] 09/21/2019   Procedure: ANTERIOR CERVICAL DECOMPRESSION/DISCECTOMY FUSION CERVICAL THREE- CERVICAL FOUR, CERVICAL FOUR- CERVICAL FIVE, CERVICAL FIVE- CERVICAL SIX;  Surgeon: Tressie Stalker, MD;  Location: [facility] OR;  Service: Neurosurgery;  Laterality: [name];  ANTERIOR CERVICAL DECOMPRESSION/DISCECTOMY FUSION CERVICAL THREE- CERVICAL FOUR, CERVICAL FOUR- CERVICAL FIVE, CERVICAL FIVE- CERVICAL SIX  . ATRIAL FIBRILLATION ABLATION [name] 05/24/2018   Procedure: ATRIAL FIBRILLATION ABLATION;  Surgeon: Hillis Range, MD;  Location: [facility] INVASIVE CV LAB;  Service: Cardiovascular;  Laterality: [name];  . BACK SURGERY  2011  . CARPAL TUNNEL RELEASE     bilateral  . CHOLECYSTECTOMY    . CORONARY ARTERY BYPASS GRAFT     2004  . ELECTROPHYSIOLOGIC STUDY [name] 09/15/2016   Procedure: Atrial Fibrillation Ablation;  Surgeon: Hillis Range, MD;  Location: [facility] INVASIVE CV LAB;  Service: Cardiovascular;  Laterality: [name];  . fistula surgery    . hemorrhoidectomy    . LEFT HEART CATH AND CORS/GRAFTS ANGIOGRAPHY [name] 02/07/2018   Procedure: LEFT HEART CATH AND CORS/GRAFTS ANGIOGRAPHY;  Surgeon: [name], Peter M, MD;  Location: [facility] INVASIVE CV LAB;  Service: Cardiovascular;  Laterality: [name];  . LUMBAR LAMINECTOMY    . right foot fracture    . TEE WITHOUT CARDIOVERSION [name] 09/15/2016   Procedure: TRANSESOPHAGEAL ECHOCARDIOGRAM  (TEE);  Surgeon: Lewayne Bunting, MD;  Location: [facility] ENDOSCOPY;  Service: Cardiovascular;  Laterality: [name];  . TOTAL KNEE ARTHROPLASTY Left 07/07/2017   Procedure: LEFT TOTAL KNEE ARTHROPLASTY;  Surgeon: Ranee Gosselin, MD;  Location: [facility] ORS;  Service: Orthopedics;  Laterality: Left;  . TRANSTHORACIC ECHOCARDIOGRAM  08/25/2010   EF 55-60%     Allergies  Allergen Reactions  . Vancomycin Anaphylaxis, Shortness Of Breath and Other (See Comments)    Was informed after heart surgery Breathing problem patient unsure  . Aldactone [Spironolactone]     GYNECOMASTIA   .  Chlorthalidone Other (See Comments)    GOUT  . Nsaids Other (See Comments)    On blood thinner      Family History  Problem Relation Age of Onset  . Breast cancer Mother   . Hypertension Mother   . Coronary artery disease Father   . Stroke Father   . Hypertension Father      Social History Mr. Griffith reports that he has never smoked. He has never used smokeless tobacco. Mr. Myhand reports current alcohol use.   Review of Systems CONSTITUTIONAL: No weight loss, fever, chills, weakness or fatigue.  HEENT: Eyes: No visual loss, blurred vision, double vision or yellow sclerae.No hearing loss, sneezing, congestion, runny nose or sore throat.  SKIN: No rash or itching.  CARDIOVASCULAR: no chest pain, no palpitations RESPIRATORY:per hpi GASTROINTESTINAL: No anorexia, nausea, vomiting or diarrhea. No abdominal pain or blood.  GENITOURINARY: No burning on urination, no polyuria NEUROLOGICAL: No headache, dizziness, syncope, paralysis, ataxia, numbness or tingling in the extremities. No change in bowel or bladder control.  MUSCULOSKELETAL: No muscle, back pain, joint pain or stiffness.  LYMPHATICS: No enlarged nodes. No history of splenectomy.  PSYCHIATRIC: No history of depression or anxiety.  ENDOCRINOLOGIC: No reports of sweating, cold or heat intolerance. No polyuria or polydipsia.  Marland Kitchen   Physical  Examination Today's Vitals   10/27/19 1051  BP: 129/72  Pulse: 87  SpO2: 95%  Weight: 228 lb (103.4 kg)   Body mass index is 36.8 kg/m.  Gen: resting comfortably, no acute distress HEENT: no scleral icterus, pupils equal round and reactive, no palptable cervical adenopathy,  CV: RRR, no m/r/g, nojvd Resp: decreased breath sounds bilateral bases GI: abdomen is soft, non-tender, non-distended, normal bowel sounds, no hepatosplenomegaly MSK: extremities are warm, 1+bilateral LE edema Skin: warm, no rash Neuro:  no focal deficits Psych: appropriate affect   Diagnostic Studies 03/2015 echo Study Conclusions  - Left ventricle: The cavity size was normal. Wall thickness was increased in a pattern of mild LVH. Systolic function was normal. The estimated ejection fraction was in the range of 50% to 55%. Wall motion was normal; there were no regional wall motion abnormalities. Doppler parameters are consistent with high ventricular filling pressure. - Left atrium: The atrium was mildly dilated.   03/2015 Lexiscan MPI  No T wave inversion was noted during stress.  There was no ST segment deviation noted during stress.  The study is normal.  This is a low risk study.  The left ventricular ejection fraction is normal (55-65%).  Nuclear stress EF: 61%.  02/2018 cath  Prox LAD lesion is 100% stenosed.  Prox Cx to Mid Cx lesion is 60% stenosed.  Post Atrio lesion is 100% stenosed.  SVG graft was visualized by angiography and is large.  The graft exhibits minimal luminal irregularities.  LIMA graft was visualized by angiography and is normal in caliber.  The graft exhibits no disease.  The left ventricular systolic function is normal.  LV end diastolic pressure is normal.  The left ventricular ejection fraction is 55-65% by visual estimate.  1. 2 vessel occlusive CAD - 100% proximal LAD - 60% mid LCx - 100% PLOM with left to right  collaterals. 2. Patent LIMA to the LAD 3. Patent SVG to the diagonal 4. Normal LV function 5. Normal LVEDP   12/2018 heart monitor  48 hr holter monitor  Min HR 46, Max HR 106, Avg HR 62  Rare ventricular ectopy, all in the form of isolated PVCs  Occasional supraventricular ectopy, primarily as isolated PACs. One 3 beat run of atach.  No significant arrhythmias     Assessment and Plan   1. SOB - ongoing issues since recent admission - on abx from recent ER visit due to possible pneumonia. Pulm notes from admission report possible diaphragm dysfunction from surgery. CT chest findings as reported above. He does have signs of fluid overload consistent with some acute on chronic diastolic HF - increase lasix to 60mg  daily, he is to call and update Monday on symptoms and weights.  - check BMET/Mg   2. HTN - restart his home eplerenone and irbesartan that were changed during recent admission.     Monday, M.D

## 2019-10-30 ENCOUNTER — Telehealth: Payer: Medicare Other | Admitting: Cardiology

## 2019-10-30 ENCOUNTER — Telehealth: Payer: Self-pay | Admitting: Cardiology

## 2019-10-30 NOTE — Telephone Encounter (Signed)
Patient called in weights:   10/28/2019 Saturday 221  10/29/2019  Sunday  219  10/30/2019   Monday  218

## 2019-10-30 NOTE — Telephone Encounter (Signed)
Calling to report weights per recent OV on 10/27/2019.

## 2019-10-30 NOTE — Telephone Encounter (Signed)
Weights are moving in the right direction, is breathing any better? Can he update [place] again on Wed   J Brettany Sydney MD

## 2019-10-31 NOTE — Telephone Encounter (Signed)
Wife states breathing is a little better.  Coughing up a lot of phlegm yesterday.  Seeing his pulmonologist tomorrow evening.  Will call back on Thursday with another update.

## 2019-11-01 ENCOUNTER — Encounter: Payer: Self-pay | Admitting: Pulmonary Disease

## 2019-11-01 ENCOUNTER — Ambulatory Visit (INDEPENDENT_AMBULATORY_CARE_PROVIDER_SITE_OTHER): Payer: Medicare Other

## 2019-11-01 ENCOUNTER — Other Ambulatory Visit: Payer: Self-pay

## 2019-11-01 ENCOUNTER — Ambulatory Visit (INDEPENDENT_AMBULATORY_CARE_PROVIDER_SITE_OTHER): Payer: Medicare Other | Admitting: Pulmonary Disease

## 2019-11-01 VITALS — BP 130/74 | HR 73 | Temp 97.8°F | Ht 64.5 in | Wt 218.0 lb

## 2019-11-01 DIAGNOSIS — J986 Disorders of diaphragm: Secondary | ICD-10-CM | POA: Diagnosis not present

## 2019-11-01 DIAGNOSIS — R0602 Shortness of breath: Secondary | ICD-10-CM

## 2019-11-01 DIAGNOSIS — I2510 Atherosclerotic heart disease of native coronary artery without angina pectoris: Secondary | ICD-10-CM | POA: Diagnosis not present

## 2019-11-01 DIAGNOSIS — I509 Heart failure, unspecified: Secondary | ICD-10-CM | POA: Diagnosis not present

## 2019-11-01 DIAGNOSIS — G4733 Obstructive sleep apnea (adult) (pediatric): Secondary | ICD-10-CM

## 2019-11-01 NOTE — Patient Instructions (Signed)
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  Increase auto CPAP settings to 14 to 17 cm-[facility]  Check blood work today for potassium and magnesium levels  Call me to report 1 week before next appointment

## 2019-11-01 NOTE — Progress Notes (Signed)
   Subjective:    Patient ID: [name] [name], male    DOB: [birth year], 69 y.o.   MRN: [id number]  HPI 69 year old obese, never smoker for follow-up of severe OSA and severe dyspnea following neck surgery   PMH significant for OSA, AFIB, CHF, HTN, myocardial infarction, GERD, hyperlipidemia, spinal stenosis.  Reviewed televisit notes from 1/19-discharge   He was discharged from hospital on 10/17/19 following cervical discectomy and decompression for spinal stenosis. He was very sick in the hospital requiring BiPAP and supplemental oxygen, he was weaned off oxygen and discharged and then had a rehab stay, he is now able to ambulate with a walker but has persistent dyspnea.  He is unable to lie down flat and can only walk a short distance  He is frustrated however wife notes that he has made considerable progress since his hospital stay.  I reviewed his hospital stay and discharge summary, he has yet to see the neurosurgeon He was discharged back to his home CPAP machine, did not like the ramp and this was discontinued, settings were changed from 14 cm to AutoSet 12 to 17 cm  He feels that machine gets cut off the last few nights suddenly in the middle of the night and leaves him gasping for air  He had 2 ED visits and CT angios suggested left lower lobe pneumonia and he was given antibiotic which he is completing Cardiology increased his Lasix even though BNP was normal, he remains on Aldactone   Chest x-ray 1/27 was reviewed which shows low lung volumes with bibasilar atelectasis/scarring, unchanged from 1/21 x-ray  Labs from 1/21 reviewed, normal renal function, normal electrolytes, leukocytosis  CPAP download report was reviewed which shows good control of events on auto 12 to 17 cm with average pressure of 14 and maximum pressure of 16 cm, mild leak and good compliance  Significant tests/ events reviewed  Sniff test 12/28 neg CT angiogram chest 1/8 low lung volumes bilateral,  raised bilateral hemidiaphragm CT angiogram 1/21 left lower lobe atelectasis/consolidation  PSG 09/2010 RDI 80/h Auto 12/2010: Optimal cpap 14cm, great compliance  Review of Systems neg for any significant sore throat, dysphagia, itching, sneezing, nasal congestion or excess/ purulent secretions, fever, chills, sweats, unintended wt loss, pleuritic or exertional cp, hempoptysis, orthopnea pnd or change in chronic leg swelling. Also denies presyncope, palpitations, heartburn, abdominal pain, nausea, vomiting, diarrhea or change in bowel or urinary habits, dysuria,hematuria, rash, arthralgias, visual complaints, headache, numbness weakness or ataxia.     Objective:   Physical Exam   Gen. Pleasant, obese, in no distress, normal affect ENT - no pallor,icterus, no post nasal drip, class 2-3 airway Neck: No JVD, no thyromegaly, no carotid bruits Lungs: no use of accessory muscles, no dullness to percussion, decreased BL without rales or rhonchi  Cardiovascular: Rhythm regular, heart sounds  normal, no murmurs or gallops, no peripheral edema Abdomen: soft and non-tender, no hepatosplenomegaly, BS normal. Musculoskeletal: No deformities, no cyanosis or clubbing Neuro:  alert, non focal, no tremors        Assessment & Plan:

## 2019-11-01 NOTE — Addendum Note (Signed)
Addended by: Demetrio Lapping E on: 11/01/2019 04:00 PM   Modules accepted: Orders

## 2019-11-02 ENCOUNTER — Telehealth: Payer: Self-pay | Admitting: Pulmonary Disease

## 2019-11-02 ENCOUNTER — Inpatient Hospital Stay: Payer: Medicare Other | Admitting: Pulmonary Disease

## 2019-11-02 DIAGNOSIS — J986 Disorders of diaphragm: Secondary | ICD-10-CM | POA: Insufficient documentation

## 2019-11-02 DIAGNOSIS — G4733 Obstructive sleep apnea (adult) (pediatric): Secondary | ICD-10-CM

## 2019-11-02 LAB — BASIC METABOLIC PANEL
BUN: 14 mg/dL (ref 6–23)
CO2: 29 mEq/L (ref 19–32)
Calcium: 9.8 mg/dL (ref 8.4–10.5)
Chloride: 102 mEq/L (ref 96–112)
Creatinine, Ser: 0.96 mg/dL (ref 0.40–1.50)
GFR: 78.32 mL/min (ref >60.00)
Glucose, Bld: 100 mg/dL — ABNORMAL HIGH (ref 70–99)
Potassium: 4.3 mEq/L (ref 3.5–5.1)
Sodium: 140 mEq/L (ref 135–145)

## 2019-11-02 LAB — MAGNESIUM: Magnesium: 2 mg/dL (ref 1.5–2.5)

## 2019-11-02 LAB — BRAIN NATRIURETIC PEPTIDE: Brain Natriuretic Peptide: 88 pg/mL (ref <100)

## 2019-11-02 NOTE — Assessment & Plan Note (Signed)
We will increase CPAP settings to of 14 to 17 cm Discontinue ramp. He will check with his DME-[place] [facility] Phlegm machine keeps cutting off and if needed we will get him a new machine given how important this is for him  He is very compliant

## 2019-11-02 NOTE — Assessment & Plan Note (Signed)
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  He has had some improvement as evidenced by coming off oxygen but still has persistent dyspnea. Sniff test was negative but his presentation is very suspicious and persistent elevation of diaphragms on all imaging. I feel that the consolidation noted on last CT was more likely atelectasis rather than true pneumonia.  I once again explained to him that it is difficult to predict the course of this condition, and CPAP/BiPAP is the only treatment.  If nothing else works we can consider phrenic nerve stimulation

## 2019-11-02 NOTE — Telephone Encounter (Signed)
Pt was seen yesterday by Dr. Vassie Loll I called [facility] and they stated the pt has a CPAP machine that was issued to him in 2017 so she is needing a Rx for CPAP supplies and the notes. RA can we send order to [facility]?

## 2019-11-02 NOTE — Telephone Encounter (Signed)
Okay to send order  His complaint was that machine keeps cutting off in the middle of the night, please have them check the machine.  It is vital that this patient have his CPAP working well during sleep

## 2019-11-02 NOTE — Telephone Encounter (Signed)
Ok order sent. PCC's can you send the notes when you fax referral. Thank you.

## 2019-11-02 NOTE — Telephone Encounter (Signed)
I will send the notes with the referral once the order has been signed.

## 2019-11-02 NOTE — Assessment & Plan Note (Signed)
Since his diuretics have been increased, we will check renal function and magnesium today

## 2019-11-03 ENCOUNTER — Telehealth: Payer: Self-pay | Admitting: Pulmonary Disease

## 2019-11-03 NOTE — Telephone Encounter (Signed)
I would follow Dr. Reginia Naas recommendations to increased pressure 14 to 17 cm

## 2019-11-03 NOTE — Telephone Encounter (Signed)
Called the patient and advised of the response received. He stated he has not received a new machine. Stated it actually has been cutting off. And waking up with a belly full of air. He uses a full face mask, does not report any  Leaks with the mask. Patient also stated it runs out of water.  Looks like the DME has not sent the new machine yet based on the order entered on 10/24/19.  Message left in triage for follow up on Monday with [facility].

## 2019-11-03 NOTE — Telephone Encounter (Signed)
See above message

## 2019-11-03 NOTE — Telephone Encounter (Signed)
Message routed to app of the day, [name] Manis, NP.  [name], this patient was seen by you on 10/24/19 for OSA and CPAP was ordered same day.   He was seen by Dr. Vassie Loll for follow up on 11/01/19.  The settings on the order placed were 10-15.  Should he continue to use the CPAP for the next couple weeks to see if there is improve?   We may have to call for the compliance report at that time.

## 2019-11-06 ENCOUNTER — Other Ambulatory Visit (HOSPITAL_COMMUNITY): Payer: Medicare Other

## 2019-11-06 ENCOUNTER — Telehealth: Payer: Self-pay | Admitting: *Deleted

## 2019-11-06 NOTE — Telephone Encounter (Signed)
[employer] and spoke with [place]. She states the pressure was changed on 1.28.21 to 14-17cm. They have not serviced his machine yet but states they just need the pt to come to the store and they will service it for him.   Called and spoke to pt. Pt states he was told by [facility] that typically when a CPAP is serviced it will cost $300. Advised pt that he needs them to see what the problem is first and then specifically tell the pt what it may cost. Pt was agreeable to this and states he will call back with an update. Pt states he wont be able to take it to the DME till later this week. Pt also states he has had the current machine since 2017 but is now using his old machine (pressure set at 13) because the newer one is malfunctioning and he feels the pressure is too high.   Will forward to Dr. Vassie Loll to advise further about pressure as pt is uncomfortable with the increased pressure change.

## 2019-11-06 NOTE — Telephone Encounter (Signed)
Can lower lasix from 60mg  to 40mg  daily. We will reevaluate at out appt next week   MD

## 2019-11-06 NOTE — Telephone Encounter (Signed)
Weight update last Tuesday 217lbs Wednesday 245YKD Thurday 214lbs Friday through today is 216lbs - pt wants to know if he should continue lasix 60 mg daily - says he is seeing neurologist tomorrow - says he is breathing somewhat better is eating a little more since he was so weak - still c/o SOB but says this is better had recently been to pulmonologist

## 2019-11-06 NOTE — Telephone Encounter (Signed)
Pt voiced understanding - updated medication list 

## 2019-11-06 NOTE — Telephone Encounter (Signed)
Go back to his previous pressure settings 10-15 cm

## 2019-11-07 ENCOUNTER — Telehealth: Payer: Self-pay | Admitting: Pulmonary Disease

## 2019-11-07 DIAGNOSIS — M4802 Spinal stenosis, cervical region: Secondary | ICD-10-CM | POA: Diagnosis not present

## 2019-11-07 NOTE — Telephone Encounter (Signed)
[facility] rep called back and stated the patient has had the current machine since 2017. Not eligible for replacement until 2022.  She stated the settings for the CPAP were changed on 11/02/19. She stated if the machine is not working and has to be repaired the patient can bring the machine to their location on [facility] and if they cannot repair at their location, it will be shipped out and they can give him a loaner CPAP machine for $35.00 per week.  LVMTCB x 1 for patient to advise him of setting change and to confirm that if the machine is working, does he notice a difference. If the machine is not working it needs to be taken to [employer] for repairs.

## 2019-11-07 NOTE — Telephone Encounter (Signed)
Spoke with the patient and advised him of the response received from [facility]. Patient said he will contact them. He stated he cannot afford the over $300 they would charge him for another machine. He was already aware of the rental cost if it has to be sent out to be repaired. I told the patient that if we put in an order we are not guaranteed they will cover since it cannot be replaced until 2022.  Nothing further needed at this time.

## 2019-11-07 NOTE — Telephone Encounter (Signed)
Routing message to Dr. Delton Coombes per Lillia Abed.

## 2019-11-07 NOTE — Telephone Encounter (Signed)
error 

## 2019-11-07 NOTE — Telephone Encounter (Signed)
I called [facility] but there was no answer. I left a message to have the respiratory therapist call us back.   Pt reports his machine is broken and it will cost over 300 dollars to fix. I was calling to see if he was eligible for another CPAP machine because he stated he could not afford to get it fixed. We will await a return call.

## 2019-11-09 ENCOUNTER — Telehealth: Payer: Self-pay | Admitting: Pulmonary Disease

## 2019-11-09 DIAGNOSIS — G4733 Obstructive sleep apnea (adult) (pediatric): Secondary | ICD-10-CM

## 2019-11-09 NOTE — Telephone Encounter (Signed)
Pt calling stating he is having problems with cpap machine. Pt stated he was told to call back if he was still having problems. [phone]

## 2019-11-09 NOTE — Telephone Encounter (Addendum)
Called and spoke to pt. Pt is requesting a new order for CPAP as his current one is broken. Pt cannot pay for the machine to be fixed and wants to see if insurance will pay for a new machine. Pt has not had his machine for more than 5 years.    Dr. Vassie Loll, please advise if ok to send order in for new CPAP machine (pressure to be at 10-15 as previously stated in 1.29.21 phone note).

## 2019-11-09 NOTE — Telephone Encounter (Signed)
Okay for new Rx auto CPAP 10 to 15 cm , EPR 2 cm

## 2019-11-10 NOTE — Telephone Encounter (Signed)
ATC pt, no answer. Left message for pt to call back.  

## 2019-11-10 NOTE — Telephone Encounter (Signed)
I have called the patient and left him a voicemail to call back. I have placed the order for a new CPAP machine.

## 2019-11-13 ENCOUNTER — Telehealth: Payer: Self-pay | Admitting: Cardiology

## 2019-11-13 NOTE — Telephone Encounter (Signed)
Patient called in weights.  11/07/2019 218  11/08/2019 218  11/09/2019 216  11/10/2019 216  11/11/2019 218  11/12/2019 217

## 2019-11-13 NOTE — Telephone Encounter (Signed)
Weights are stable, we will reevaluate at our f/u this week  Dominga Ferry MD

## 2019-11-14 NOTE — Telephone Encounter (Signed)
Wife Anabel Halon) notified & verbalized understanding.

## 2019-11-17 ENCOUNTER — Telehealth (INDEPENDENT_AMBULATORY_CARE_PROVIDER_SITE_OTHER): Payer: Medicare Other | Admitting: Cardiology

## 2019-11-17 ENCOUNTER — Encounter: Payer: Self-pay | Admitting: Cardiology

## 2019-11-17 VITALS — BP 122/68 | HR 73 | Ht 66.0 in | Wt 217.0 lb

## 2019-11-17 DIAGNOSIS — R0602 Shortness of breath: Secondary | ICD-10-CM | POA: Diagnosis not present

## 2019-11-17 DIAGNOSIS — I10 Essential (primary) hypertension: Secondary | ICD-10-CM

## 2019-11-17 NOTE — Patient Instructions (Signed)

## 2019-11-17 NOTE — Progress Notes (Signed)
Virtual Visit via Telephone Note   This visit type was conducted due to national recommendations for restrictions regarding the COVID-19 Pandemic (e.g. social distancing) in an effort to limit this patient's exposure and mitigate transmission in our community.  Due to his co-morbid illnesses, this patient is at least at moderate risk for complications without adequate follow up.  This format is felt to be most appropriate for this patient at this time.  The patient did not have access to video technology/had technical difficulties with video requiring transitioning to audio format only (telephone).  All issues noted in this document were discussed and addressed.  No physical exam could be performed with this format.  Please refer to the patient's chart for his  consent to telehealth for [facility].   Date:  11/17/2019   ID:  TIN ENGRAM, DOB 10-09-1950, MRN [id number]  Patient Location: Home Provider Location: Office  PCP:  Tempie Hoist, FNP  Cardiologist:  Carlyle Dolly, MD  Electrophysiologist:  Thompson Grayer, MD   Evaluation Performed:  Follow-Up Visit  Chief Complaint:  Follow up  History of Present Illness:    [name] [name] is a 69 y.o. male seen today for a focused visit for recent issues with SOB and HTN.    1. SOB - no cough or wheezing - mild LE edema, does not have a scale unsure if he gained any weight - DOE with mild activities. No orthopnea. Severe fatigue. Reports a diagnosis of COVID 1 month over 1 month ago   - admit 09/2019. Had presented for neck surgery. Postop issues with respiratory distress requiring bipap. - from notes thought to be multifactorial incluiding postop swelling/neck hematoma, volume overload, possibly diaphragmatic injury.     - seen in ER last night for SOB - COVID and flu negative. WBC 20 (chronically elevated over last few weks)  Hgb 13.7 Plt 345  CXR no acute process CT PE no PE. Large LLL consolidation vs atelectasis. -  started on oral abx for possible pneumonia  - has had some increased LE edema - sleeps in recliner due to orthopnea - occasional cough, clear sputum at times.    - last visit lasix was increased to 60mg  daily. Weight at that visit was 228 lbs. Weight came down, last reported 217 lbs,. We chagned him to lasix 40mg  daily.  Jan 27 labs K 4.3 Cr 0.96 BNP 88 - breathing improving but still up and down     2. HTN   - bp's in clinic typically elevated, suspect some white coat HTN - he checks at home regularly. Had been 120s-140s until he had to stop chlorthalidone 37.5mg  daily for gout flare . Since that time bp's have been difficult to control, has had some reported symptoms - currently on dilt 360, eplerenone 50mg  (gynecomastiaon aldactone), irbesartan 300. Headaches on hydralazine, stopped.Started on prazosin due to lack of other options, stopped due to severe dizziness. Dilt prevoiusly lowered from 360 to 180 for possible fatigue and headaches.Off prazosin due to dizziness  - issues with gout on chlorthalidone.    - home bp's 100s-120s/60s - compliant with meds - he has lost 30 lbs since 03/2019    The patient does not have symptoms concerning for COVID-19 infection (fever, chills, cough, or new shortness of breath).    Past Medical History:  Diagnosis Date  . Anxiety   . CHF (congestive heart failure) (HCC)    h/o due to fluid overload in recovery room  . Coronary  artery disease    2v CABG, 2004 [facility])  . COVID-19   . DJD (degenerative joint disease)   . Dyspnea    on exertion  . GERD (gastroesophageal reflux disease)   . History of kidney stones    3 stones yrs. ago  . HLD (hyperlipidemia)   . Hypertension   . MI, old 2004  . OA (osteoarthritis)   . Obesity   . Persistent atrial fibrillation (HCC)    failed medical therapy with sotalol, s/p PVI x 2  . Sleep apnea    on CPAP  . Spinal stenosis   . Typical atrial flutter [facility])    Past Surgical History:    Procedure Laterality Date  . ANTERIOR CERVICAL DECOMP/DISCECTOMY FUSION [name] 09/21/2019   Procedure: ANTERIOR CERVICAL DECOMPRESSION/DISCECTOMY FUSION CERVICAL THREE- CERVICAL FOUR, CERVICAL FOUR- CERVICAL FIVE, CERVICAL FIVE- CERVICAL SIX;  Surgeon: Tressie Stalker, MD;  Location: [facility] OR;  Service: Neurosurgery;  Laterality: [name];  ANTERIOR CERVICAL DECOMPRESSION/DISCECTOMY FUSION CERVICAL THREE- CERVICAL FOUR, CERVICAL FOUR- CERVICAL FIVE, CERVICAL FIVE- CERVICAL SIX  . ATRIAL FIBRILLATION ABLATION [name] 05/24/2018   Procedure: ATRIAL FIBRILLATION ABLATION;  Surgeon: Hillis Range, MD;  Location: [facility] INVASIVE CV LAB;  Service: Cardiovascular;  Laterality: [name];  . BACK SURGERY  2011  . CARPAL TUNNEL RELEASE     bilateral  . CHOLECYSTECTOMY    . CORONARY ARTERY BYPASS GRAFT     2004  . ELECTROPHYSIOLOGIC STUDY [name] 09/15/2016   Procedure: Atrial Fibrillation Ablation;  Surgeon: Hillis Range, MD;  Location: [facility] INVASIVE CV LAB;  Service: Cardiovascular;  Laterality: [name];  . fistula surgery    . hemorrhoidectomy    . LEFT HEART CATH AND CORS/GRAFTS ANGIOGRAPHY [name] 02/07/2018   Procedure: LEFT HEART CATH AND CORS/GRAFTS ANGIOGRAPHY;  Surgeon: [name], Peter M, MD;  Location: [facility] INVASIVE CV LAB;  Service: Cardiovascular;  Laterality: [name];  . LUMBAR LAMINECTOMY    . right foot fracture    . TEE WITHOUT CARDIOVERSION [name] 09/15/2016   Procedure: TRANSESOPHAGEAL ECHOCARDIOGRAM (TEE);  Surgeon: Lewayne Bunting, MD;  Location: [facility] ENDOSCOPY;  Service: Cardiovascular;  Laterality: [name];  . TOTAL KNEE ARTHROPLASTY Left 07/07/2017   Procedure: LEFT TOTAL KNEE ARTHROPLASTY;  Surgeon: Ranee Gosselin, MD;  Location: [facility] ORS;  Service: Orthopedics;  Laterality: Left;  . TRANSTHORACIC ECHOCARDIOGRAM  08/25/2010   EF 55-60%     No outpatient medications have been marked as taking for the 11/17/19 encounter (Appointment) with Antoine Poche, MD.     Allergies:   Vancomycin, Aldactone [spironolactone],  Chlorthalidone, and Nsaids   Social History   Tobacco Use  . Smoking status: Never Smoker  . Smokeless tobacco: Never Used  Substance Use Topics  . Alcohol use: Yes    Alcohol/week: 0.0 standard drinks    Comment: ocassional  . Drug use: No     Family Hx: The patient's family history includes Breast cancer in his mother; Coronary artery disease in his father; Hypertension in his father and mother; Stroke in his father.  ROS:   Please see the history of present illness.     All other systems reviewed and are negative.   Prior CV studies:   The following studies were reviewed today:  03/2015 echo Study Conclusions  - Left ventricle: The cavity size was normal. Wall thickness was increased in a pattern of mild LVH. Systolic function was normal. The estimated ejection fraction was in the range of 50% to 55%. Wall motion was normal; there were no regional wall motion abnormalities.  Doppler parameters are consistent with high ventricular filling pressure. - Left atrium: The atrium was mildly dilated.   03/2015 Lexiscan MPI  No T wave inversion was noted during stress.  There was no ST segment deviation noted during stress.  The study is normal.  This is a low risk study.  The left ventricular ejection fraction is normal (55-65%).  Nuclear stress EF: 61%.  02/2018 cath  Prox LAD lesion is 100% stenosed.  Prox Cx to Mid Cx lesion is 60% stenosed.  Post Atrio lesion is 100% stenosed.  SVG graft was visualized by angiography and is large.  The graft exhibits minimal luminal irregularities.  LIMA graft was visualized by angiography and is normal in caliber.  The graft exhibits no disease.  The left ventricular systolic function is normal.  LV end diastolic pressure is normal.  The left ventricular ejection fraction is 55-65% by visual estimate.  1. 2 vessel occlusive CAD - 100% proximal LAD - 60% mid LCx - 100% PLOM with left to  right collaterals. 2. Patent LIMA to the LAD 3. Patent SVG to the diagonal 4. Normal LV function 5. Normal LVEDP   12/2018 heart monitor  48 hr holter monitor  Min HR 46, Max HR 106, Avg HR 62  Rare ventricular ectopy, all in the form of isolated PVCs  Occasional supraventricular ectopy, primarily as isolated PACs. One 3 beat run of atach.  No significant arrhythmias  Labs/Other Tests and Data Reviewed:    EKG:  No ECG reviewed.  Recent Labs: 10/26/2019: ALT 22; Hemoglobin 13.7; Hemoglobin 14.3; Platelets 345 11/01/2019: Brain Natriuretic Peptide 88; BUN 14; Creatinine, Ser 0.96; Magnesium 2.0; Potassium 4.3; Sodium 140   Recent Lipid Panel Lab Results  Component Value Date/Time   CHOL 157 02/05/2018 02:27 AM   TRIG 131 02/05/2018 02:27 AM   HDL 29 (L) 02/05/2018 02:27 AM   CHOLHDL 5.4 02/05/2018 02:27 AM   LDLCALC 102 (H) 02/05/2018 02:27 AM    Wt Readings from Last 3 Encounters:  11/01/19 218 lb (98.9 kg)  10/27/19 228 lb (103.4 kg)  10/26/19 228 lb 2.8 oz (103.5 kg)     Objective:    Vital Signs:   Today's Vitals   11/17/19 1240 11/17/19 1242  BP: 106/68 122/68  Pulse: 73 73  Weight: 217 lb (98.4 kg)   Height: 5\' 6"  (1.676 m)    Body mass index is 35.02 kg/m. Normal affect. Normal speech pattern and tone. Comfortable, no apparent distress. No audible signs of SOB or wheezing.   ASSESSMENT & PLAN:    1. SOB - ongoing issues since recent admission - on abx from recent ER visit due to possible pneumonia. Pulm notes from admission report possible diaphragm dysfunction from surgery. CT chest findings as reported above.  - from a fluid standpoing down 11 lbs since last visit, good response to adjustements in lasix. Now maintining weight around 217 on lasix 40mg  daily, counseled can take 60mg  if weight 200lbs or more. Labs are stable I think his HF is optimized.    2. HTN At goal since resuming his home regimen last visit, continue current meds - 30 lbs  weight since last year. Historically his bp was difficult to control, with weight loss much better controlled.         COVID-19 Education: The signs and symptoms of COVID-19 were discussed with the patient and how to seek care for testing (follow up with PCP or arrange E-visit).  The importance of social distancing  was discussed today.  Time:   Today, I have spent 22 minutes with the patient with telehealth technology discussing the above problems.     Medication Adjustments/Labs and Tests Ordered: Current medicines are reviewed at length with the patient today.  Concerns regarding medicines are outlined above.   Tests Ordered: No orders of the defined types were placed in this encounter.   Medication Changes: No orders of the defined types were placed in this encounter.   Follow Up:  Either In Person or Virtual in 6 month(s)  Signed, Dina Rich, MD  11/17/2019 12:19 PM    [facility]

## 2019-11-20 ENCOUNTER — Other Ambulatory Visit: Payer: Self-pay

## 2019-11-20 ENCOUNTER — Encounter: Payer: Self-pay | Admitting: Physical Medicine and Rehabilitation

## 2019-11-20 ENCOUNTER — Encounter
Payer: Medicare Other | Attending: Physical Medicine and Rehabilitation | Admitting: Physical Medicine and Rehabilitation

## 2019-11-20 VITALS — BP 125/81 | HR 66 | Temp 97.9°F | Ht 66.0 in | Wt 219.8 lb

## 2019-11-20 DIAGNOSIS — M4802 Spinal stenosis, cervical region: Secondary | ICD-10-CM | POA: Diagnosis not present

## 2019-11-20 DIAGNOSIS — G992 Myelopathy in diseases classified elsewhere: Secondary | ICD-10-CM | POA: Diagnosis not present

## 2019-11-20 DIAGNOSIS — J986 Disorders of diaphragm: Secondary | ICD-10-CM | POA: Diagnosis not present

## 2019-11-20 DIAGNOSIS — G959 Disease of spinal cord, unspecified: Secondary | ICD-10-CM

## 2019-11-20 NOTE — Progress Notes (Signed)
Subjective:    Patient ID: [name] [name], male    DOB: [birth year], 69 y.o.   MRN: [id number]  HPI   69 year old male, never smoked. PMH significant for OSA, AFIB, CHF, HTN, myocardial infarction, GERD, hyperlipidemia, spinal stenosis.  Uses CPAP- s/p cervical discectomy and decompression for cervical spinal stenosis.   Was seen in ED for SOB in January- no PE; LLL consolidation vs atelectasis. Started on PO ABX for presumed pneumonia.    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  Leodis Rains is better- still has a lot of SOB. Dr Lovell Sheehan f/u 3/2 Dr. Leory Plowman pulmonary- B/L diaphragms   Spits up a lot of mucus sometimes.    Felt like Rehab was fantastic- Ladona Ridgel and Elijah Birk. Mark the CNA was great.  Clydie Braun, the RN.   Voiding is NOW NORMAL again.  LBM yesterday- bowels coming regularly. Not using stool softeners- only as needed.   Pain Inventory Average Pain 3 Pain Right Now 3 My pain is dull  In the last 24 hours, has pain interfered with the following? General activity 2 Relation with others 2 Enjoyment of life 2 What TIME of day is your pain at its worst? daytime Sleep (in general) Fair  Pain is worse with: standing Pain improves with: medication Relief from Meds: 5  Mobility walk without assistance ability to climb steps?  yes do you drive?  yes  Function I need assistance with the following:  dressing, bathing and meal prep  Neuro/Psych weakness anxiety  Prior Studies Any changes since last visit?  no  Physicians involved in your care Any changes since last visit?  no   Family History  Problem Relation Age of Onset  . Breast cancer Mother   . Hypertension Mother   . Coronary artery disease Father   . Stroke Father   . Hypertension Father    Social History   Socioeconomic History  . Marital status: Married    Spouse name: Not on file  . Number of children: 0  . Years of education: Not on file  . Highest education level:  Not on file  Occupational History  . Occupation: Architect: [employer]  Tobacco Use  . Smoking status: Never Smoker  . Smokeless tobacco: Never Used  Substance and Sexual Activity  . Alcohol use: Yes    Alcohol/week: 0.0 standard drinks    Comment: ocassional  . Drug use: No  . Sexual activity: Yes    Partners: Female  Other Topics Concern  . Not on file  Social History Narrative  . Not on file   Social Determinants of Health   Financial Resource Strain:   . Difficulty of Paying Living Expenses: Not on file  Food Insecurity:   . Worried About Programme researcher, broadcasting/film/video in the Last Year: Not on file  . Ran Out of Food in the Last Year: Not on file  Transportation Needs:   . Lack of Transportation (Medical): Not on file  . Lack of Transportation (Non-Medical): Not on file  Physical Activity:   . Days of Exercise per Week: Not on file  . Minutes of Exercise per Session: Not on file  Stress:   . Feeling of Stress : Not on file  Social Connections:   . Frequency of Communication with Friends and Family: Not on file  . Frequency of Social Gatherings with Friends and Family: Not on file  . Attends Religious Services: Not on  file  . Active Member of Clubs or Organizations: Not on file  . Attends Archivist Meetings: Not on file  . Marital Status: Not on file   Past Surgical History:  Procedure Laterality Date  . ANTERIOR CERVICAL DECOMP/DISCECTOMY FUSION [name] 09/21/2019   Procedure: ANTERIOR CERVICAL DECOMPRESSION/DISCECTOMY FUSION CERVICAL THREE- CERVICAL FOUR, CERVICAL FOUR- CERVICAL FIVE, CERVICAL FIVE- CERVICAL SIX;  Surgeon: Newman Pies, MD;  Location: [place];  Service: Neurosurgery;  Laterality: [name];  ANTERIOR CERVICAL DECOMPRESSION/DISCECTOMY FUSION CERVICAL THREE- CERVICAL FOUR, CERVICAL FOUR- CERVICAL FIVE, CERVICAL FIVE- CERVICAL SIX  . ATRIAL FIBRILLATION ABLATION [name] 05/24/2018   Procedure: ATRIAL FIBRILLATION ABLATION;  Surgeon:  Thompson Grayer, MD;  Location: [place] CV LAB;  Service: Cardiovascular;  Laterality: [name];  . BACK SURGERY  2011  . CARPAL TUNNEL RELEASE     bilateral  . CHOLECYSTECTOMY    . CORONARY ARTERY BYPASS GRAFT     2004  . ELECTROPHYSIOLOGIC STUDY [name] 09/15/2016   Procedure: Atrial Fibrillation Ablation;  Surgeon: Thompson Grayer, MD;  Location: [place] CV LAB;  Service: Cardiovascular;  Laterality: [name];  . fistula surgery    . hemorrhoidectomy    . LEFT HEART CATH AND CORS/GRAFTS ANGIOGRAPHY [name] 02/07/2018   Procedure: LEFT HEART CATH AND CORS/GRAFTS ANGIOGRAPHY;  Surgeon: Martinique, Peter M, MD;  Location: [place] CV LAB;  Service: Cardiovascular;  Laterality: [name];  . LUMBAR LAMINECTOMY    . right foot fracture    . TEE WITHOUT CARDIOVERSION [name] 09/15/2016   Procedure: TRANSESOPHAGEAL ECHOCARDIOGRAM (TEE);  Surgeon: Lelon Perla, MD;  Location: [place];  Service: Cardiovascular;  Laterality: [name];  . TOTAL KNEE ARTHROPLASTY Left 07/07/2017   Procedure: LEFT TOTAL KNEE ARTHROPLASTY;  Surgeon: Latanya Maudlin, MD;  Location: [facility] ORS;  Service: Orthopedics;  Laterality: Left;  . TRANSTHORACIC ECHOCARDIOGRAM  08/25/2010   EF 55-60%   Past Medical History:  Diagnosis Date  . Anxiety   . CHF (congestive heart failure) (HCC)    h/o due to fluid overload in recovery room  . Coronary artery disease    2v CABG, 2004 [facility])  . COVID-19   . DJD (degenerative joint disease)   . Dyspnea    on exertion  . GERD (gastroesophageal reflux disease)   . History of kidney stones    3 stones yrs. ago  . HLD (hyperlipidemia)   . Hypertension   . MI, old 2004  . OA (osteoarthritis)   . Obesity   . Persistent atrial fibrillation ([place])    failed medical therapy with sotalol, s/p PVI x 2  . Sleep apnea    on CPAP  . Spinal stenosis   . Typical atrial flutter (HCC)    There were no vitals taken for this visit.  Opioid Risk Score:   Fall Risk Score:  `1  Depression screen PHQ  2/9  Depression screen [facility] 2/9 07/12/2016 08/11/2013  Decreased Interest 0 1  Down, Depressed, Hopeless 0 1  PHQ - 2 Score 0 2  Altered sleeping - 0  Tired, decreased energy - 0  Change in appetite - 0  Feeling bad or failure about yourself  - 1  Trouble concentrating - 0  Moving slowly or fidgety/restless - 0  Suicidal thoughts - 1  PHQ-9 Score - 4  Some recent data might be hidden     Review of Systems  Constitutional: Negative.   HENT: Negative.   Eyes: Negative.   Respiratory: Positive for apnea and shortness of breath.   Cardiovascular:  Negative.   Gastrointestinal: Negative.   Endocrine: Negative.   Genitourinary: Negative.   Musculoskeletal: Negative.   Skin: Negative.   Allergic/Immunologic: Negative.   Neurological: Positive for weakness.  Hematological: Negative.   Psychiatric/Behavioral: The patient is nervous/anxious.   All other systems reviewed and are negative. gets out of breath real easy      Objective:   Physical Exam  Awake, alert, appropriate, accompanied by wife, NAD CTA B/L- shallow breathing Abd soft, NT, ND, (+)BS hyperactive RRR- quiet heart sounds MS: RUE delt 4+/5, biceps 4/5, triceps 4/5, WE 4/5, grip 4+/5, finger 3+/5 LUE: delt 4/5, biceps 4-/5, triceps 4-/5, WE 4-/5, grip 4-/5, finger 3/5  RLE 4+/5 HF, KE,KF, DF, PF LLE- 4/5 in same muscles  Neuro: No hoffman's or clonus in UEs or LEs - no signs of spasticity Impaired sensation at C5 on L downwards but not on R      Assessment & Plan:   69 year old male, never smoked. PMH significant for OSA, AFIB, CHF, HTN, myocardial infarction, GERD, hyperlipidemia, spinal stenosis.  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 Discussed Neurogenic bowel and bladder, which has sounds like resolved.   2. Discussed C4 ASIA D- myelopathy-good prognosis.    3. Meeting with  Pulmonary regarding diaphragmatic paralyzed.  Use CPAP for resting.   4. No issues with sexuality/intimacy.  5. Discussed signs and symptoms of spasticity.  If develops, will develop by 6 months- if not, no issues; if does, come back to treat.   6. F/U  As needed

## 2019-11-20 NOTE — Patient Instructions (Signed)
69 year old male, never smoked. PMH significant for OSA, AFIB, CHF, HTN, myocardial infarction, GERD, hyperlipidemia, spinal stenosis.  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 Discussed Neurogenic bowel and bladder, which has sounds like resolved.   2. Discussed C4 ASIA D- myelopathy-good prognosis.    3. Meeting with Pulmonary regarding diaphragmatic paralyzed.  Use CPAP for resting.   4. No issues with sexuality/intimacy.  5. Discussed signs and symptoms of spasticity.  If develops, will develop by 6 months- if not, no issues; if does, come back to treat.

## 2019-11-29 ENCOUNTER — Other Ambulatory Visit: Payer: Self-pay | Admitting: Cardiology

## 2019-12-05 DIAGNOSIS — M4712 Other spondylosis with myelopathy, cervical region: Secondary | ICD-10-CM | POA: Diagnosis not present

## 2019-12-05 DIAGNOSIS — R0782 Intercostal pain: Secondary | ICD-10-CM | POA: Diagnosis not present

## 2019-12-13 ENCOUNTER — Other Ambulatory Visit: Payer: Self-pay

## 2019-12-13 ENCOUNTER — Ambulatory Visit (INDEPENDENT_AMBULATORY_CARE_PROVIDER_SITE_OTHER): Payer: Medicare Other | Admitting: Pulmonary Disease

## 2019-12-13 ENCOUNTER — Encounter: Payer: Self-pay | Admitting: Pulmonary Disease

## 2019-12-13 DIAGNOSIS — J986 Disorders of diaphragm: Secondary | ICD-10-CM | POA: Diagnosis not present

## 2019-12-13 DIAGNOSIS — G4733 Obstructive sleep apnea (adult) (pediatric): Secondary | ICD-10-CM

## 2019-12-13 DIAGNOSIS — I2510 Atherosclerotic heart disease of native coronary artery without angina pectoris: Secondary | ICD-10-CM

## 2019-12-13 NOTE — Assessment & Plan Note (Signed)
Continue CPAP at current pressure of 11.6 cm, the worsening residual AHI seems to be related to large leak and possibly may need higher pressure especially when he is supine -If he has persistent residual AHI then we will increase his CPAP to 12 to 13 cm, auto settings seem to have not worked in the past  Weight loss encouraged, compliance with goal of at least 4-6 hrs every night is the expectation. Advised against medications with sedative side effects Cautioned against driving when sleepy - understanding that sleepiness will vary on a day to day basis

## 2019-12-13 NOTE — Progress Notes (Signed)
   Subjective:    Patient ID: [name] [name], male    DOB: [birth year], 69 y.o.   MRN: [id number]  HPI  69 yo obese, never smoker for follow-up of severe OSA and severe dyspnea following neck surgery  PMH significant for OSA, AFIB, CHF, HTN, myocardial infarction, GERD, hyperlipidemia, spinal stenosis.   He was discharged from hospitalon 10/17/19 followingcervical discectomy and decompressionfor spinal stenosis. developed diaphragmatic paralysis post neck surgery -await neurosurgical opinion on prognosis for this.  He has had some improvement as evidenced by coming off oxygen but still has persistent dyspnea. Sniff test was negative but his presentation is very suspicious and persistent elevation of diaphragms on all imaging. He has improved significantly,, off oxygen, able to ambulate short distances but still has to stop to catch his breath. On his last visit he requested more pressure and we increased to auto setting 14 to 17 cm, he realized that auto settings was making his machine stopped because his breathing would be very shallow, he self decreased his machine to 11.6 cm.  Download on the settings shows control of events with residual AHI of 4.7 but on some nights exceeding 10/hour, great compliance and small leak  He had a follow-up appointment with Dr. Lovell Sheehan and chest x-ray was obtained   Significant tests/ events reviewed  Sniff test 12/28 neg CT angiogram chest 1/8 low lung volumes bilateral, raised bilateral hemidiaphragm CT angiogram 1/21 left lower lobe atelectasis/consolidation  PSG 09/2010 RDI 80/h Auto 12/2010: Optimal cpap 14cm, great compliance   Review of Systems neg for any significant sore throat, dysphagia, itching, sneezing, nasal congestion or excess/ purulent secretions, fever, chills, sweats, unintended wt loss, pleuritic or exertional cp, hempoptysis, orthopnea pnd or change in chronic leg swelling. Also denies presyncope, palpitations, heartburn,  abdominal pain, nausea, vomiting, diarrhea or change in bowel or urinary habits, dysuria,hematuria, rash, arthralgias, visual complaints, headache, numbness weakness or ataxia.     Objective:   Physical Exam  Gen. Pleasant, obese, in no distress ENT - no lesions, no post nasal drip Neck: No JVD, no thyromegaly, no carotid bruits Lungs: no use of accessory muscles, no dullness to percussion, decreased without rales or rhonchi  Cardiovascular: Rhythm regular, heart sounds  normal, no murmurs or gallops, no peripheral edema Musculoskeletal: No deformities, no cyanosis or clubbing , no tremors       Assessment & Plan:

## 2019-12-13 NOTE — Patient Instructions (Signed)
Okay to proceed with Covid shot.  Stay active, 30 to 40 minutes of exercise 3-4 times a week.  We will obtain chest x-ray from Dr. Lovell Sheehan office. CPAP report is acceptable on 1.6 cm

## 2019-12-13 NOTE — Assessment & Plan Note (Signed)
This was not demonstrated on sniff test but seems to be the most probable diagnosis given his symptom onset right after surgery and his initial chest x-rays This seems to have improved now although he seems to have significant orthopnea and mild dyspnea on exertion

## 2019-12-16 DIAGNOSIS — Z23 Encounter for immunization: Secondary | ICD-10-CM | POA: Diagnosis not present

## 2019-12-18 ENCOUNTER — Telehealth: Payer: Self-pay | Admitting: Pulmonary Disease

## 2019-12-18 NOTE — Telephone Encounter (Signed)
Can you please obtain this CXR report from Dr Tressie Stalker  -neurosurgery office ? Annabelle Harman had requested

## 2019-12-18 NOTE — Telephone Encounter (Signed)
Dr. Vassie Loll please advise on xray results.

## 2019-12-18 NOTE — Telephone Encounter (Signed)
Called Dr. Lovell Sheehan office at [phone] and spoke with Gunnar Fusi with medical records. She is faxing the report over to the main front fax to Dr. Reginia Naas attention.   Will keep in triage to follow up on.

## 2019-12-19 NOTE — Telephone Encounter (Signed)
I checked in Dr. Ellery Plunk Pod folder,and front desk mail box.  Patient cxr were not found. Called Dr. Lovell Sheehan, Neuro office, requested cxr report to be faxed for Dr. Vassie Loll. CXR report received and placed in Dr. Adan Sis folder.

## 2019-12-21 ENCOUNTER — Telehealth: Payer: Self-pay | Admitting: Pulmonary Disease

## 2019-12-21 NOTE — Telephone Encounter (Signed)
Checked Dr. Reginia Naas look at. This document has not been received.  LMTCB x1 for pt's wife.

## 2019-12-22 ENCOUNTER — Telehealth: Payer: Self-pay | Admitting: Pulmonary Disease

## 2019-12-22 NOTE — Telephone Encounter (Signed)
Pt called to ask about this--he says he's signed a release form and would like the CT to be faxed to Dr. Lovell Sheehan [phone]

## 2019-12-22 NOTE — Telephone Encounter (Signed)
Called and spoke with pt letting him know that we still have not received the results of cxr from Dr. Lovell Sheehan' office. Stated to him that we have contacted their office for the results but have not received them. Pt stated that he would contact the office to see if he is able to get anywhere with [place] receiving the results so Dr. Vassie Loll can be able to review.  While speaking with pt, pt stated he wakes up feeling like his stomach is full of air. He said his stomach is really hurting and feels completely bloated. Pt stated he started off with cpap pressure being at 12 and he has cut it down to 11.2 but wonders if that could still be too much.  While we are waiting on results of pt's cxr from Dr. Lovell Sheehan' office, Dr. Vassie Loll, please advise on any recs for pt.

## 2019-12-22 NOTE — Telephone Encounter (Signed)
Checked airview to see what it had pt's EPR at and it was set at 3cm  Called and spoke with pt letting him know the info stated by RA and pt verbalized understanding. Stated to him once we receive xray results we will let him know. Since there is another open encounter about pt in regards to xray results, will close this encounter.

## 2019-12-22 NOTE — Telephone Encounter (Signed)
Checked C pod, Dr. Reginia Naas box.  CXR was where I  placed, after calling  and requesting fax from Neurology 12/19/19. Called and spoke with Patient's Wife, [name] [name].  [name] [name] is aware cxr from Dr. Lovell Sheehan office was received and placed in Dr. Reginia Naas box.   Message routed to Dr. Vassie Loll, as Lorain Childes

## 2019-12-22 NOTE — Telephone Encounter (Signed)
Pt returning call.  [phone]

## 2019-12-22 NOTE — Telephone Encounter (Signed)
Please ensure that his EPR settings are at +3 cm Okay to keep CPAP pressure 11 to 12 cm

## 2019-12-25 NOTE — Telephone Encounter (Signed)
ATC patient. LM for Patient to call back when available.

## 2019-12-25 NOTE — Telephone Encounter (Signed)
Pt returning call.  [phone] 

## 2019-12-25 NOTE — Telephone Encounter (Signed)
Called and spoke with Patient's Wife, Stanton Kidney.  Dr. Reginia Naas results given.  Understanding stated.  Nothing further at this time.

## 2019-12-25 NOTE — Telephone Encounter (Signed)
CXR report reviewed - no change from previous films

## 2019-12-28 ENCOUNTER — Telehealth: Payer: Self-pay | Admitting: Pulmonary Disease

## 2019-12-28 DIAGNOSIS — G4733 Obstructive sleep apnea (adult) (pediatric): Secondary | ICD-10-CM

## 2019-12-28 NOTE — Telephone Encounter (Signed)
Download looks fine except mask is leaking  Please send order to DME to for mask fitting.  Please contact office for sooner follow up if symptoms do not improve or worsen or seek emergency care

## 2019-12-28 NOTE — Telephone Encounter (Signed)
Spoke with patient. He stated that RA changed his settings on his cpap on 3/10. He has not noticed a difference with the pressure. He is still waking up with excessive amounts of air in his stomach in the mornings. The extra air is causing discomfort for him.   He wants to know if the settings can be changed again.   TP, can you please advise? I will print out a download for you to review. Thanks!

## 2019-12-28 NOTE — Telephone Encounter (Signed)
Spoke with the pt and notified of response per Tammy  He verbalized understanding  I have send order to [facility] for mask fitting

## 2020-01-13 DIAGNOSIS — Z23 Encounter for immunization: Secondary | ICD-10-CM | POA: Diagnosis not present

## 2020-01-18 ENCOUNTER — Telehealth: Payer: Self-pay | Admitting: Cardiology

## 2020-01-18 NOTE — Telephone Encounter (Signed)
Asking for samples for Eliquis  will be coming to [facility] next 45 minutes

## 2020-01-18 NOTE — Telephone Encounter (Signed)
Pt came by office and picked up samples. 

## 2020-01-28 ENCOUNTER — Other Ambulatory Visit: Payer: Self-pay | Admitting: Cardiology

## 2020-01-29 ENCOUNTER — Other Ambulatory Visit: Payer: Self-pay | Admitting: *Deleted

## 2020-01-29 MED ORDER — EPLERENONE 25 MG PO TABS: 25.0000 mg | ORAL_TABLET | Freq: Every day | ORAL | 1 refills | Status: DC

## 2020-01-29 MED ORDER — EPLERENONE 25 MG PO TABS: 25.0000 mg | ORAL_TABLET | Freq: Two times a day (BID) | ORAL | 1 refills | Status: DC

## 2020-02-06 DIAGNOSIS — G8929 Other chronic pain: Secondary | ICD-10-CM | POA: Diagnosis not present

## 2020-02-06 DIAGNOSIS — M25512 Pain in left shoulder: Secondary | ICD-10-CM | POA: Diagnosis not present

## 2020-02-06 DIAGNOSIS — M25511 Pain in right shoulder: Secondary | ICD-10-CM | POA: Diagnosis not present

## 2020-02-06 DIAGNOSIS — M4712 Other spondylosis with myelopathy, cervical region: Secondary | ICD-10-CM | POA: Diagnosis not present

## 2020-04-06 ENCOUNTER — Other Ambulatory Visit: Payer: Self-pay | Admitting: Cardiology

## 2020-04-09 ENCOUNTER — Other Ambulatory Visit: Payer: Self-pay | Admitting: Cardiology

## 2020-04-09 ENCOUNTER — Other Ambulatory Visit: Payer: Self-pay

## 2020-04-09 MED ORDER — FUROSEMIDE 20 MG PO TABS: 40.0000 mg | ORAL_TABLET | Freq: Every day | ORAL | 6 refills | Status: DC

## 2020-04-09 NOTE — Telephone Encounter (Signed)
Refilled  lasix 40 mg qd 

## 2020-04-15 ENCOUNTER — Telehealth: Payer: Self-pay | Admitting: Cardiology

## 2020-04-15 MED ORDER — APIXABAN 5 MG PO TABS: 5.0000 mg | ORAL_TABLET | Freq: Two times a day (BID) | ORAL | 0 refills | Status: DC

## 2020-04-15 NOTE — Telephone Encounter (Signed)
Patient notified via voicemail - samples ready for pick up.

## 2020-04-15 NOTE — Telephone Encounter (Signed)
Asking for samples for Eliquis

## 2020-05-16 ENCOUNTER — Ambulatory Visit: Payer: Medicare Other | Admitting: Cardiology

## 2020-05-16 ENCOUNTER — Encounter: Payer: Self-pay | Admitting: Cardiology

## 2020-05-16 ENCOUNTER — Other Ambulatory Visit: Payer: Self-pay

## 2020-05-16 ENCOUNTER — Ambulatory Visit (INDEPENDENT_AMBULATORY_CARE_PROVIDER_SITE_OTHER): Payer: Medicare Other | Admitting: Cardiology

## 2020-05-16 VITALS — BP 124/68 | HR 60 | Ht 66.0 in | Wt 216.0 lb

## 2020-05-16 DIAGNOSIS — I2510 Atherosclerotic heart disease of native coronary artery without angina pectoris: Secondary | ICD-10-CM | POA: Diagnosis not present

## 2020-05-16 DIAGNOSIS — I10 Essential (primary) hypertension: Secondary | ICD-10-CM | POA: Diagnosis not present

## 2020-05-16 DIAGNOSIS — I4891 Unspecified atrial fibrillation: Secondary | ICD-10-CM

## 2020-05-16 NOTE — Progress Notes (Signed)
Clinical Summary [name] is a 69 y.o.male seen today for a focused visit for recent issues with SOB and HTN.    1. SOB - no cough or wheezing - mild LE edema, does not have a scale unsure if he gained any weight - DOE with mild activities. No orthopnea. Severe fatigue. Reports a diagnosis of COVID 1 month over 1 month ago   - admit 09/2019. Had presented for neck surgery. Postop issues with respiratory distress requiring bipap. - from notes thought to be multifactorial incluiding postop swelling/neck hematoma, volume overload, possibly diaphragmatic injury.    - seen in ER last night for SOB - COVID and flu negative. WBC 20 (chronically elevated over last few weks) Hgb 13.7 Plt 345  CXR no acute process CT PE no PE. Large LLL consolidation vs atelectasis. - started on oral abx for possible pneumonia  -has had some increased LE edema - sleeps in recliner due to orthopnea - occasional cough, clear sputum at times.   - last visit lasix was increased to 60mg  daily. Weight at that visit was 228 lbs. Weight came down, last reported 217 lbs,. We chagned him to lasix 40mg  daily.  Jan 27 labs K 4.3 Cr 0.96 BNP 88 - breathing improving but still up and down   - no recent edema. Weight down to 216 lbs, compliant with lasix 40mg  daily.     2. HTN   - bp's in clinic typically elevated, suspect some white coat HTN - he checks at home regularly. Had been 120s-140s until he had to stop chlorthalidone 37.5mg  daily for gout flare . Since that time bp's have been difficult to control, has had some reported symptoms - currently on dilt 360, eplerenone 50mg  (gynecomastiaon aldactone), irbesartan 300. Headaches on hydralazine, stopped.Started on prazosin due to lack of other options, stopped due to severe dizziness. Dilt prevoiusly lowered from 360 to 180 for possible fatigue and headaches.Off prazosin due to dizziness  - issues with gout on chlorthalidone.      - he lowered his hydralazine to bid dosing. Home SBPs 100s-120s - home bp's much improved with 30 lbs weight loss since last year    3. CAD  - prior 2 vessel CABG in 2004 at [facility]  - apparently had follow up cath in 2006 from [place] with patent grafts. Reports negative stress test in 2011 by [facility] in [place]. 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 OSA  - compliant with CPAP - followed by Dr [place].   5. Hyperlipidemia  - noted some side effects on higher lipitor doses, has tolerated lower dose well  6. OSA  - compliant with CPAP  7. Afib - s/p ablation. He had failed rate control as well as multiple antiarrhythmics - no recent palpitations. No bleeding on eliquis  - recurrent afib 02/2018 - 05/2018 repeat ablation.   - denies any palpitations.     Past Medical History:  Diagnosis Date  . Anxiety   . CHF (congestive heart failure) (HCC)    h/o due to fluid overload in recovery room  . Coronary artery disease    2v CABG, 2004 [facility])  . COVID-19   . DJD (degenerative joint disease)   . Dyspnea    on  exertion  . GERD (gastroesophageal reflux disease)   . History of kidney stones    3 stones yrs. ago  . HLD (hyperlipidemia)   . Hypertension   . MI, old 2004  . OA (osteoarthritis)   . Obesity   . Persistent atrial fibrillation (HCC)    failed medical therapy with sotalol, s/p PVI x 2  . Sleep apnea    on CPAP  . Spinal stenosis   . Typical atrial flutter (HCC)      Allergies  Allergen Reactions  . Vancomycin Anaphylaxis, Shortness Of Breath and Other (See Comments)    Was informed after heart surgery Breathing problem patient unsure  . Aldactone [Spironolactone]     GYNECOMASTIA   .  Chlorthalidone Other (See Comments)    GOUT  . Nsaids Other (See Comments)    On blood thinner     Current Outpatient Medications  Medication Sig Dispense Refill  . acetaminophen (TYLENOL) 325 MG tablet Take 2 tablets (650 mg total) by mouth every 4 (four) hours as needed for mild pain ((score 1 to 3) or temp > 100.5).    Marland Kitchen apixaban (ELIQUIS) 5 MG TABS tablet Take 1 tablet (5 mg total) by mouth 2 (two) times daily. 56 tablet 0  . atorvastatin (LIPITOR) 20 MG tablet Take 1 tablet (20 mg total) by mouth daily. 90 tablet 1  . Coenzyme Q10 (CO Q 10 PO) Take 400 mg by mouth daily.     . cyclobenzaprine (FLEXERIL) 10 MG tablet Take 1 tablet (10 mg total) by mouth 3 (three) times daily as needed for muscle spasms. 60 tablet 0  . diltiazem (CARDIZEM CD) 180 MG 24 hr capsule Take 1 capsule (180 mg total) by mouth daily. 90 capsule 3  . docusate sodium (COLACE) 100 MG capsule Take 1 capsule (100 mg total) by mouth 2 (two) times daily. 10 capsule 0  . eplerenone (INSPRA) 25 MG tablet Take 1 tablet (25 mg total) by mouth 2 (two) times daily. 180 tablet 1  . furosemide (LASIX) 20 MG tablet Take 2 tablets (40 mg total) by mouth daily. 60 tablet 6  . hydrALAZINE (APRESOLINE) 50 MG tablet Take 1.5 tablets (75 mg total) by mouth 3 (three) times daily. 405 tablet 1  . irbesartan (AVAPRO) 300 MG tablet Take 300 mg by mouth daily.    Marland Kitchen LORazepam (ATIVAN) 0.5 MG tablet Take 1 tablet (0.5 mg total) by mouth 4 (four) times daily. 120 tablet 0  . metoprolol tartrate (LOPRESSOR) 25 MG tablet Take 0.5 tablets (12.5 mg total) by mouth 2 (two) times daily. 60 tablet 1  . Multiple Vitamin (MULTIVITAMIN) tablet Take 1 tablet by mouth daily.    . nitroGLYCERIN (NITROSTAT) 0.4 MG SL tablet Place 1 tablet (0.4 mg total) under the tongue every 5 (five) minutes x 3 doses as needed for chest pain. 25 tablet 1  . omeprazole (PRILOSEC) 20 MG capsule TAKE 1 CAPSULE BY MOUTH EVERY OTHER DAY. 45 capsule 3  . PRESCRIPTION MEDICATION  Pt uses CPAP machine at bedtime    . sertraline (ZOLOFT) 50 MG tablet Take 1 tablet (50 mg total) by mouth daily. 30 tablet 1  . tamsulosin (FLOMAX) 0.4 MG CAPS capsule Take 2 capsules (0.8 mg total) by mouth daily. 60 capsule 0   No current facility-administered medications for this visit.     Past Surgical History:  Procedure Laterality Date  . ANTERIOR CERVICAL DECOMP/DISCECTOMY FUSION [name] 09/21/2019   Procedure: ANTERIOR CERVICAL DECOMPRESSION/DISCECTOMY FUSION CERVICAL THREE-  CERVICAL FOUR, CERVICAL FOUR- CERVICAL FIVE, CERVICAL FIVE- CERVICAL SIX;  Surgeon: Tressie Stalker, MD;  Location: [facility] OR;  Service: Neurosurgery;  Laterality: [name];  ANTERIOR CERVICAL DECOMPRESSION/DISCECTOMY FUSION CERVICAL THREE- CERVICAL FOUR, CERVICAL FOUR- CERVICAL FIVE, CERVICAL FIVE- CERVICAL SIX  . ATRIAL FIBRILLATION ABLATION [name] 05/24/2018   Procedure: ATRIAL FIBRILLATION ABLATION;  Surgeon: Hillis Range, MD;  Location: [facility] INVASIVE CV LAB;  Service: Cardiovascular;  Laterality: [name];  . BACK SURGERY  2011  . CARPAL TUNNEL RELEASE     bilateral  . CHOLECYSTECTOMY    . CORONARY ARTERY BYPASS GRAFT     2004  . ELECTROPHYSIOLOGIC STUDY [name] 09/15/2016   Procedure: Atrial Fibrillation Ablation;  Surgeon: Hillis Range, MD;  Location: [facility] INVASIVE CV LAB;  Service: Cardiovascular;  Laterality: [name];  . fistula surgery    . hemorrhoidectomy    . LEFT HEART CATH AND CORS/GRAFTS ANGIOGRAPHY [name] 02/07/2018   Procedure: LEFT HEART CATH AND CORS/GRAFTS ANGIOGRAPHY;  Surgeon: [name], Peter M, MD;  Location: [facility] INVASIVE CV LAB;  Service: Cardiovascular;  Laterality: [name];  . LUMBAR LAMINECTOMY    . right foot fracture    . TEE WITHOUT CARDIOVERSION [name] 09/15/2016   Procedure: TRANSESOPHAGEAL ECHOCARDIOGRAM (TEE);  Surgeon: Lewayne Bunting, MD;  Location: [facility] ENDOSCOPY;  Service: Cardiovascular;  Laterality: [name];  . TOTAL KNEE ARTHROPLASTY Left 07/07/2017   Procedure: LEFT TOTAL KNEE ARTHROPLASTY;  Surgeon: Ranee Gosselin, MD;  Location: [facility] ORS;  Service: Orthopedics;  Laterality: Left;  . TRANSTHORACIC ECHOCARDIOGRAM  08/25/2010   EF 55-60%     Allergies  Allergen Reactions  . Vancomycin Anaphylaxis, Shortness Of Breath and Other (See Comments)    Was informed after heart surgery Breathing problem patient unsure  . Aldactone [Spironolactone]     GYNECOMASTIA   . Chlorthalidone Other (See Comments)    GOUT  . Nsaids Other (See Comments)    On blood thinner      Family History  Problem Relation Age of Onset  . Breast cancer Mother   . Hypertension Mother   . Coronary artery disease Father   . Stroke Father   . Hypertension Father      Social History [name] reports that he has never smoked. He has never used smokeless tobacco. [name] reports current alcohol use.   Review of Systems CONSTITUTIONAL: No weight loss, fever, chills, weakness or fatigue.  HEENT: Eyes: No visual loss, blurred vision, double vision or yellow sclerae.No hearing loss, sneezing, congestion, runny nose or sore throat.  SKIN: No rash or itching.  CARDIOVASCULAR: per hpi RESPIRATORY: No shortness of breath, cough or sputum.  GASTROINTESTINAL: No anorexia, nausea, vomiting or diarrhea. No abdominal pain or blood.  GENITOURINARY: No burning on urination, no polyuria NEUROLOGICAL: No headache, dizziness, syncope, paralysis, ataxia, numbness or tingling in the extremities. No change in bowel or bladder control.  MUSCULOSKELETAL: No muscle, back pain, joint pain or stiffness.  LYMPHATICS: No enlarged nodes. No history of splenectomy.  PSYCHIATRIC: No history of depression or anxiety.  ENDOCRINOLOGIC: No reports of sweating, cold or heat intolerance. No polyuria or polydipsia.  Marland Kitchen   Physical Examination Today's Vitals   05/16/20 1410  BP: 124/68  Pulse: 60  SpO2: 97%  Weight: 216 lb (98 kg)  Height: 5\' 6"  (1.676 m)   Body mass index is 34.86 kg/m.  Gen: resting comfortably, no acute  distress HEENT: no scleral icterus, pupils equal round and reactive, no palptable cervical adenopathy,  CV: RRR, no m/r/g, no jvd Resp: Clear  to auscultation bilaterally GI: abdomen is soft, non-tender, non-distended, normal bowel sounds, no hepatosplenomegaly MSK: extremities are warm, no edema.  Skin: warm, no rash Neuro:  no focal deficits Psych: appropriate affect   Diagnostic Studies   03/2015 echo Study Conclusions  - Left ventricle: The cavity size was normal. Wall thickness was increased in a pattern of mild LVH. Systolic function was normal. The estimated ejection fraction was in the range of 50% to 55%. Wall motion was normal; there were no regional wall motion abnormalities. Doppler parameters are consistent with high ventricular filling pressure. - Left atrium: The atrium was mildly dilated.   03/2015 Lexiscan MPI  No T wave inversion was noted during stress.  There was no ST segment deviation noted during stress.  The study is normal.  This is a low risk study.  The left ventricular ejection fraction is normal (55-65%).  Nuclear stress EF: 61%.  02/2018 cath  Prox LAD lesion is 100% stenosed.  Prox Cx to Mid Cx lesion is 60% stenosed.  Post Atrio lesion is 100% stenosed.  SVG graft was visualized by angiography and is large.  The graft exhibits minimal luminal irregularities.  LIMA graft was visualized by angiography and is normal in caliber.  The graft exhibits no disease.  The left ventricular systolic function is normal.  LV end diastolic pressure is normal.  The left ventricular ejection fraction is 55-65% by visual estimate.  1. 2 vessel occlusive CAD - 100% proximal LAD - 60% mid LCx - 100% PLOM with left to right collaterals. 2. Patent LIMA to the LAD 3. Patent SVG to the diagonal 4. Normal LV function 5. Normal LVEDP   12/2018 heart monitor  48 hr holter monitor  Min HR 46, Max HR 106, Avg HR  62  Rare ventricular ectopy, all in the form of isolated PVCs  Occasional supraventricular ectopy, primarily as isolated PACs. One 3 beat run of atach.  No significant arrhythmias   Assessment and Plan   1. HTN -at goal, continue current meds - much improved with 30 lbs weight loss since last year  2. Afib - no symptoms, continue current meds  3. CAD - no symptoms, continue current meds      Antoine Poche, M.D

## 2020-05-16 NOTE — Patient Instructions (Signed)
Medication Instructions:  Your physician recommends that you continue on your current medications as directed. Please refer to the Current Medication list given to you today.  *If you need a refill on your cardiac medications before your next appointment, please call your pharmacy*   Lab Work: Cbc,cmet,tsh,A1c, lipid (Fasting) , magnesium   If you have labs (blood work) drawn today and your tests are completely normal, you will receive your results only by: Marland Kitchen MyChart Message (if you have MyChart) OR . A paper copy in the mail If you have any lab test that is abnormal or we need to change your treatment, we will call you to review the results.   Testing/Procedures: None today   Follow-Up: At [facility], you and your health needs are our priority.  As part of our continuing mission to provide you with exceptional heart care, we have created designated Provider Care Teams.  These Care Teams include your primary Cardiologist (physician) and Advanced Practice Providers (APPs -  Physician Assistants and Nurse Practitioners) who all work together to provide you with the care you need, when you need it.  We recommend signing up for the patient portal called "MyChart".  Sign up information is provided on this After Visit Summary.  MyChart is used to connect with patients for Virtual Visits (Telemedicine).  Patients are able to view lab/test results, encounter notes, upcoming appointments, etc.  Non-urgent messages can be sent to your provider as well.   To learn more about what you can do with MyChart, go to [email or web address].    Your next appointment:   6 month(s)  The format for your next appointment:   In Person  Provider:   Dina Rich, MD   Other Instructions None      Thank you for choosing [facility] !

## 2020-05-17 ENCOUNTER — Other Ambulatory Visit (HOSPITAL_COMMUNITY)
Admission: RE | Admit: 2020-05-17 | Discharge: 2020-05-17 | Disposition: A | Discharge status: Home / Self Care | Payer: Medicare Other | Source: Ambulatory Visit | Attending: Cardiology | Admitting: Cardiology

## 2020-05-17 ENCOUNTER — Other Ambulatory Visit: Payer: Self-pay

## 2020-05-17 DIAGNOSIS — I2510 Atherosclerotic heart disease of native coronary artery without angina pectoris: Secondary | ICD-10-CM | POA: Diagnosis not present

## 2020-05-17 DIAGNOSIS — I10 Essential (primary) hypertension: Secondary | ICD-10-CM | POA: Insufficient documentation

## 2020-05-17 DIAGNOSIS — I4891 Unspecified atrial fibrillation: Secondary | ICD-10-CM | POA: Insufficient documentation

## 2020-05-17 LAB — COMPREHENSIVE METABOLIC PANEL
ALT: 18 U/L (ref 0–44)
AST: 18 U/L (ref 15–41)
Albumin: 3.8 g/dL (ref 3.5–5.0)
Alkaline Phosphatase: 77 U/L (ref 38–126)
Anion gap: 10 (ref 5–15)
BUN: 22 mg/dL (ref 8–23)
CO2: 26 mmol/L (ref 22–32)
Calcium: 9.8 mg/dL (ref 8.9–10.3)
Chloride: 104 mmol/L (ref 98–111)
Creatinine, Ser: 1.01 mg/dL (ref 0.61–1.24)
GFR calc Af Amer: >60 mL/min (ref >60)
GFR calc non Af Amer: >60 mL/min (ref >60)
Glucose, Bld: 110 mg/dL — ABNORMAL HIGH (ref 70–99)
Potassium: 4.9 mmol/L (ref 3.5–5.1)
Sodium: 140 mmol/L (ref 135–145)
Total Bilirubin: 0.7 mg/dL (ref 0.3–1.2)
Total Protein: 7.6 g/dL (ref 6.5–8.1)

## 2020-05-17 LAB — HEMOGLOBIN A1C
Hgb A1c MFr Bld: 5.9 % — ABNORMAL HIGH (ref 4.8–5.6)
Mean Plasma Glucose: 122.63 mg/dL

## 2020-05-17 LAB — LIPID PANEL
Cholesterol: 173 mg/dL (ref 0–200)
HDL: 45 mg/dL (ref >40)
LDL Cholesterol: 102 mg/dL — ABNORMAL HIGH (ref 0–99)
Total CHOL/HDL Ratio: 3.8 RATIO
Triglycerides: 131 mg/dL (ref <150)
VLDL: 26 mg/dL (ref 0–40)

## 2020-05-17 LAB — MAGNESIUM: Magnesium: 2.2 mg/dL (ref 1.7–2.4)

## 2020-05-17 LAB — CBC
HCT: 44.8 % (ref 39.0–52.0)
Hemoglobin: 14.6 g/dL (ref 13.0–17.0)
MCH: 29 pg (ref 26.0–34.0)
MCHC: 32.6 g/dL (ref 30.0–36.0)
MCV: 89.1 fL (ref 80.0–100.0)
Platelets: 312 10*3/uL (ref 150–400)
RBC: 5.03 MIL/uL (ref 4.22–5.81)
RDW: 14.4 % (ref 11.5–15.5)
WBC: 10.6 10*3/uL — ABNORMAL HIGH (ref 4.0–10.5)
nRBC: 0 % (ref 0.0–0.2)

## 2020-05-17 LAB — TSH: TSH: 4.145 u[IU]/mL (ref 0.350–4.500)

## 2020-05-20 DIAGNOSIS — Z23 Encounter for immunization: Secondary | ICD-10-CM | POA: Diagnosis not present

## 2020-05-21 DIAGNOSIS — M4712 Other spondylosis with myelopathy, cervical region: Secondary | ICD-10-CM | POA: Diagnosis not present

## 2020-05-24 ENCOUNTER — Other Ambulatory Visit: Payer: Self-pay | Admitting: Cardiology

## 2020-05-24 NOTE — Telephone Encounter (Signed)
Refill sent.

## 2020-05-24 NOTE — Telephone Encounter (Signed)
New message    Patient would also like call back regarding his blood work   *STAT* If patient is at the pharmacy, call can be transferred to refill team.   1. Which medications need to be refilled? (please list name of each medication and dose if known) diltiazem (CARDIZEM CD) 180 MG 24 hr capsule(Expired)  2. Which pharmacy/location (including street and city if local pharmacy) is medication to be sent to? [address] [facility]  3. Do they need a 30 day or 90 day supply?  90

## 2020-05-27 ENCOUNTER — Telehealth: Payer: Self-pay | Admitting: *Deleted

## 2020-05-27 NOTE — Telephone Encounter (Signed)
Did labs on 05/16/2020 for [name] - patient request his test results.  Informed patient that results are on provider desk top awaiting his review.  Will notify as soon as available.  He verbalized understanding.

## 2020-05-28 NOTE — Telephone Encounter (Signed)
Just resulted  J Zaccheus Edmister MD 

## 2020-05-29 NOTE — Telephone Encounter (Signed)
Lesle Chris, LPN  7/56/4332 95:18 PM EDT Back to Top    Notified, copy to pcp.    Antoine Poche, MD  05/28/2020 2:04 PM EDT     Labs look good, no concerns  Dominga Ferry MD

## 2020-06-19 ENCOUNTER — Ambulatory Visit (INDEPENDENT_AMBULATORY_CARE_PROVIDER_SITE_OTHER): Payer: Medicare Other | Admitting: Pulmonary Disease

## 2020-06-19 ENCOUNTER — Encounter: Payer: Self-pay | Admitting: Pulmonary Disease

## 2020-06-19 ENCOUNTER — Other Ambulatory Visit: Payer: Self-pay

## 2020-06-19 DIAGNOSIS — I2510 Atherosclerotic heart disease of native coronary artery without angina pectoris: Secondary | ICD-10-CM | POA: Diagnosis not present

## 2020-06-19 DIAGNOSIS — G4733 Obstructive sleep apnea (adult) (pediatric): Secondary | ICD-10-CM

## 2020-06-19 DIAGNOSIS — J986 Disorders of diaphragm: Secondary | ICD-10-CM | POA: Diagnosis not present

## 2020-06-19 NOTE — Patient Instructions (Signed)
CPAP supplies will be renewed for a year -[facility] in [place]. Your CPAP is set at 5.6 cm and seems to be working well. Stay active

## 2020-06-19 NOTE — Assessment & Plan Note (Signed)
Unclear etiology, persistent, increased weight loss and aerobic exercise

## 2020-06-19 NOTE — Progress Notes (Signed)
   Subjective:    Patient ID: [name] [name], male    DOB: 25-Dec-1950, 69 y.o.   MRN: [id number]  HPI  69 yo obese, never smokerfor follow-up of severe OSA and severe dyspnea following neck surgery  PMH significant for OSA, AFIB, CHF, HTN, myocardial infarction, GERD, hyperlipidemia, spinal stenosis.  He was discharged from hospitalon 10/17/19 followingcervical discectomy and decompressionfor spinal stenosis. developed diaphragmatic paralysis post neck surgery He had significant improvement, came off oxygen but remains dyspneic.  He is able to walk short distances and then has to stop.  He cannot raise his arm above his shoulder and has numbness over his left arm. He has self decrease CPAP pressure starting at 14 cm to 11 and now to 5.6 cm because this was causing him to bloat. We reviewed CPAP download.  Wife does not report any snoring.  He reports well rested when he wakes up in the morning   Significant tests/ events reviewed  Sniff test 12/28neg CT angiogramchest1/8 low lung volumes bilateral, raised bilateral hemidiaphragm CT angiogram 1/21 left lower lobe atelectasis/consolidation  PSG 09/2010 RDI 80/h Auto 12/2010: Optimal cpap 14cm, great compliance  Review of Systems Patient denies significant dyspnea,cough, hemoptysis,  chest pain, palpitations, pedal edema, orthopnea, paroxysmal nocturnal dyspnea, lightheadedness, nausea, vomiting, abdominal or  leg pains      Objective:   Physical Exam  Gen. Pleasant, obese, in no distress ENT - no lesions, no post nasal drip Neck: No JVD, no thyromegaly, no carotid bruits Lungs: no use of accessory muscles, no dullness to percussion, decreased without rales or rhonchi  Cardiovascular: Rhythm regular, heart sounds  normal, no murmurs or gallops, no peripheral edema Musculoskeletal: No deformities, no cyanosis or clubbing , no tremors        Assessment & Plan:

## 2020-06-19 NOTE — Assessment & Plan Note (Signed)
CPAP supplies will be renewed for a year -[facility] in [place]. Your CPAP is set at 5.6 cm and seems to be working well.-He self decreased it to low-level but this seems to be working well.  CPAP download does not show any residual events excellent compliance more than 8 hours per night and minimal leak  Weight loss encouraged, compliance with goal of at least 4-6 hrs every night is the expectation. Advised against medications with sedative side effects Cautioned against driving when sleepy - understanding that sleepiness will vary on a day to day basis

## 2020-06-21 DIAGNOSIS — Z23 Encounter for immunization: Secondary | ICD-10-CM | POA: Diagnosis not present

## 2020-06-24 ENCOUNTER — Other Ambulatory Visit: Payer: Self-pay | Admitting: *Deleted

## 2020-06-24 MED ORDER — ATORVASTATIN CALCIUM 20 MG PO TABS: 20.0000 mg | ORAL_TABLET | Freq: Every day | ORAL | 1 refills | Status: DC

## 2020-06-27 ENCOUNTER — Telehealth: Payer: Self-pay | Admitting: Cardiology

## 2020-06-27 MED ORDER — APIXABAN 5 MG PO TABS: 5.0000 mg | ORAL_TABLET | Freq: Two times a day (BID) | ORAL | 0 refills | Status: DC

## 2020-06-27 NOTE — Telephone Encounter (Signed)
Pt will come by tomorrow to pick up samples  

## 2020-06-27 NOTE — Telephone Encounter (Signed)
Would like to get samples of Eliquis.

## 2020-07-01 ENCOUNTER — Ambulatory Visit: Payer: Medicare Other | Admitting: Internal Medicine

## 2020-07-19 ENCOUNTER — Other Ambulatory Visit: Payer: Self-pay

## 2020-07-19 ENCOUNTER — Ambulatory Visit (INDEPENDENT_AMBULATORY_CARE_PROVIDER_SITE_OTHER): Payer: Medicare Other | Admitting: Internal Medicine

## 2020-07-19 ENCOUNTER — Encounter: Payer: Self-pay | Admitting: Internal Medicine

## 2020-07-19 VITALS — BP 122/68 | HR 55 | Ht 66.0 in | Wt 219.6 lb

## 2020-07-19 DIAGNOSIS — I4819 Other persistent atrial fibrillation: Secondary | ICD-10-CM | POA: Diagnosis not present

## 2020-07-19 DIAGNOSIS — I10 Essential (primary) hypertension: Secondary | ICD-10-CM | POA: Diagnosis not present

## 2020-07-19 DIAGNOSIS — I4892 Unspecified atrial flutter: Secondary | ICD-10-CM | POA: Diagnosis not present

## 2020-07-19 DIAGNOSIS — I2510 Atherosclerotic heart disease of native coronary artery without angina pectoris: Secondary | ICD-10-CM | POA: Diagnosis not present

## 2020-07-19 DIAGNOSIS — G4733 Obstructive sleep apnea (adult) (pediatric): Secondary | ICD-10-CM | POA: Diagnosis not present

## 2020-07-19 NOTE — Patient Instructions (Signed)
Medication Instructions:  Your physician recommends that you continue on your current medications as directed. Please refer to the Current Medication list given to you today.  *If you need a refill on your cardiac medications before your next appointment, please call your pharmacy*  Lab Work: None ordered.  If you have labs (blood work) drawn today and your tests are completely normal, you will receive your results only by: . MyChart Message (if you have MyChart) OR . A paper copy in the mail If you have any lab test that is abnormal or we need to change your treatment, we will call you to review the results.  Testing/Procedures: None ordered.  Follow-Up: At [facility], you and your health needs are our priority.  As part of our continuing mission to provide you with exceptional heart care, we have created designated Provider Care Teams.  These Care Teams include your primary Cardiologist (physician) and Advanced Practice Providers (APPs -  Physician Assistants and Nurse Practitioners) who all work together to provide you with the care you need, when you need it.  We recommend signing up for the patient portal called "MyChart".  Sign up information is provided on this After Visit Summary.  MyChart is used to connect with patients for Virtual Visits (Telemedicine).  Patients are able to view lab/test results, encounter notes, upcoming appointments, etc.  Non-urgent messages can be sent to your provider as well.   To learn more about what you can do with MyChart, go to [email or web address].    Your next appointment:   Your physician wants you to follow-up in: 12 months with Dr. Allred. You will receive a reminder letter in the mail two months in advance. If you don't receive a letter, please call our office to schedule the follow-up appointment.    Other Instructions:  

## 2020-07-19 NOTE — Progress Notes (Signed)
PCP: Delorse Lek, FNP Primary Cardiologist: Dr Wyline Mood Primary EP: Dr Johney Frame  [name] [name] is a 69 y.o. male who presents today for routine electrophysiology followup.  He had neck surgery this past December with prolonged hospitalization.  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  These symptoms are new from his last visit with me and began after his neck surgery.  Today, he denies symptoms of palpitations, chest pain,   lower extremity edema, dizziness, presyncope, or syncope.  The patient is otherwise without complaint today.   Past Medical History:  Diagnosis Date  . Anxiety   . CHF (congestive heart failure) (HCC)    h/o due to fluid overload in recovery room  . Coronary artery disease    2v CABG, 2004 [facility])  . COVID-19   . DJD (degenerative joint disease)   . Dyspnea    on exertion  . GERD (gastroesophageal reflux disease)   . History of kidney stones    3 stones yrs. ago  . HLD (hyperlipidemia)   . Hypertension   . MI, old 2004  . OA (osteoarthritis)   . Obesity   . Persistent atrial fibrillation (HCC)    failed medical therapy with sotalol, s/p PVI x 2  . Sleep apnea    on CPAP  . Spinal stenosis   . Typical atrial flutter [facility])    Past Surgical History:  Procedure Laterality Date  . ANTERIOR CERVICAL DECOMP/DISCECTOMY FUSION [name] 09/21/2019   Procedure: ANTERIOR CERVICAL DECOMPRESSION/DISCECTOMY FUSION CERVICAL THREE- CERVICAL FOUR, CERVICAL FOUR- CERVICAL FIVE, CERVICAL FIVE- CERVICAL SIX;  Surgeon: Tressie Stalker, MD;  Location: [facility] OR;  Service: Neurosurgery;  Laterality: [name];  ANTERIOR CERVICAL DECOMPRESSION/DISCECTOMY FUSION CERVICAL THREE- CERVICAL FOUR, CERVICAL FOUR- CERVICAL FIVE, CERVICAL FIVE- CERVICAL SIX  . ATRIAL FIBRILLATION ABLATION [name] 05/24/2018   Procedure: ATRIAL FIBRILLATION ABLATION;  Surgeon: Hillis Range, MD;  Location: [facility] INVASIVE CV LAB;  Service: Cardiovascular;  Laterality: [name];  . BACK SURGERY   2011  . CARPAL TUNNEL RELEASE     bilateral  . CHOLECYSTECTOMY    . CORONARY ARTERY BYPASS GRAFT     2004  . ELECTROPHYSIOLOGIC STUDY [name] 09/15/2016   Procedure: Atrial Fibrillation Ablation;  Surgeon: Hillis Range, MD;  Location: [facility] INVASIVE CV LAB;  Service: Cardiovascular;  Laterality: [name];  . fistula surgery    . hemorrhoidectomy    . LEFT HEART CATH AND CORS/GRAFTS ANGIOGRAPHY [name] 02/07/2018   Procedure: LEFT HEART CATH AND CORS/GRAFTS ANGIOGRAPHY;  Surgeon: [name], Peter M, MD;  Location: [facility] INVASIVE CV LAB;  Service: Cardiovascular;  Laterality: [name];  . LUMBAR LAMINECTOMY    . right foot fracture    . TEE WITHOUT CARDIOVERSION [name] 09/15/2016   Procedure: TRANSESOPHAGEAL ECHOCARDIOGRAM (TEE);  Surgeon: Lewayne Bunting, MD;  Location: [facility] ENDOSCOPY;  Service: Cardiovascular;  Laterality: [name];  . TOTAL KNEE ARTHROPLASTY Left 07/07/2017   Procedure: LEFT TOTAL KNEE ARTHROPLASTY;  Surgeon: Ranee Gosselin, MD;  Location: [facility] ORS;  Service: Orthopedics;  Laterality: Left;  . TRANSTHORACIC ECHOCARDIOGRAM  08/25/2010   EF 55-60%    ROS- all systems are reviewed and negatives except as per HPI above  Current Outpatient Medications  Medication Sig Dispense Refill  . acetaminophen (TYLENOL) 325 MG tablet Take 2 tablets (650 mg total) by mouth every 4 (four) hours as needed for mild pain ((score 1 to 3) or temp > 100.5).    Marland Kitchen apixaban (ELIQUIS) 5 MG TABS tablet Take 1 tablet (5 mg total) by  mouth 2 (two) times daily. 56 tablet 0  . atorvastatin (LIPITOR) 20 MG tablet Take 1 tablet (20 mg total) by mouth daily. 90 tablet 1  . Coenzyme Q10 (CO Q 10 PO) Take 400 mg by mouth daily.     Marland Kitchen diltiazem (CARDIZEM CD) 180 MG 24 hr capsule TAKE 1 CAPSULE BY MOUTH EVERY DAY 90 capsule 3  . eplerenone (INSPRA) 25 MG tablet Take 1 tablet (25 mg total) by mouth 2 (two) times daily. 180 tablet 1  . furosemide (LASIX) 20 MG tablet Take 2 tablets (40 mg total) by mouth daily. 60 tablet 6  . hydrALAZINE  (APRESOLINE) 50 MG tablet Take 1.5 tablets (75 mg total) by mouth 3 (three) times daily. 405 tablet 1  . irbesartan (AVAPRO) 300 MG tablet Take 300 mg by mouth daily.    Marland Kitchen LORazepam (ATIVAN) 0.5 MG tablet Take 1 tablet (0.5 mg total) by mouth 4 (four) times daily. 120 tablet 0  . metoprolol tartrate (LOPRESSOR) 25 MG tablet Take 0.5 tablets (12.5 mg total) by mouth 2 (two) times daily. 60 tablet 1  . Multiple Vitamin (MULTIVITAMIN) tablet Take 1 tablet by mouth daily.    . nitroGLYCERIN (NITROSTAT) 0.4 MG SL tablet Place 1 tablet (0.4 mg total) under the tongue every 5 (five) minutes x 3 doses as needed for chest pain. 25 tablet 1  . omeprazole (PRILOSEC) 20 MG capsule TAKE 1 CAPSULE BY MOUTH EVERY OTHER DAY. 45 capsule 3  . PRESCRIPTION MEDICATION Pt uses CPAP machine at bedtime    . sertraline (ZOLOFT) 50 MG tablet Take 1 tablet (50 mg total) by mouth daily. 30 tablet 1   No current facility-administered medications for this visit.    Physical Exam: Vitals:   07/19/20 1403  BP: 122/68  Pulse: (!) 55  SpO2: 94%  Weight: 219 lb 9.6 oz (99.6 kg)  Height: 5\' 6"  (1.676 m)    GEN- The patient is well appearing, alert and oriented x 3 today.   Head- normocephalic, atraumatic Eyes-  Sclera clear, conjunctiva pink Ears- hearing intact Oropharynx- clear Lungs-   normal work of breathing Heart- Regular rate and rhythm  GI- soft  Extremities- no clubbing, cyanosis, or edema  Wt Readings from Last 3 Encounters:  07/19/20 219 lb 9.6 oz (99.6 kg)  06/19/20 216 lb (98 kg)  05/16/20 216 lb (98 kg)    EKG tracing ordered today is personally reviewed and shows sinus rhythm, LVH, nonspecific ST/T changes  Assessment and Plan:  1. Persistent atrial fibrillation/ atrial flutter Well controlled post ablation off AAD therapy chads2vasc score is 3.  Continue eliquis  2. HTN Stable No change required today  3. CAD No ischemic symptoms S/p CABG  4. Obesity Lifestyle modification is  encouraged He has lost 40 lbs in the past 3 years  5. OSA Compliant with CPAP  Risks, benefits and potential toxicities for medications prescribed and/or refilled reviewed with patient today.   Return in a year Follow-up with Dr 07/16/20 as scheduled  Wyline Mood MD, [facility] 07/19/2020 2:20 PM

## 2020-08-03 IMAGING — RF DG SNIFF TEST
4 series · 16 of 16 positions shown · non-contrast
Comparison: None other than chest x-ray evaluations and previous
CTs.

CLINICAL DATA: Acute respiratory failure

EXAM:
CHEST FLUOROSCOPY
TECHNIQUE: Real-time fluoroscopic evaluation of the chest was performed.
FLUOROSCOPY TIME:  Fluoroscopy Time:  1 minutes
Radiation Exposure Index (if provided by the fluoroscopic device):
3.7 mGy
Number of Acquired Spot Images: 0

[Series 1: cp_chest · 0.56mm/px · 4 of 32 frames shown (1 of 4)]
[frame 5/32]
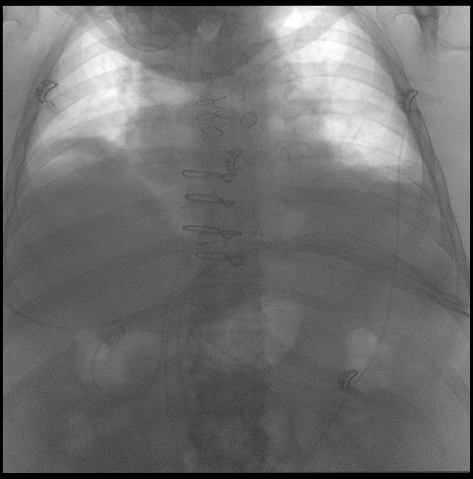
[frame 17/32]
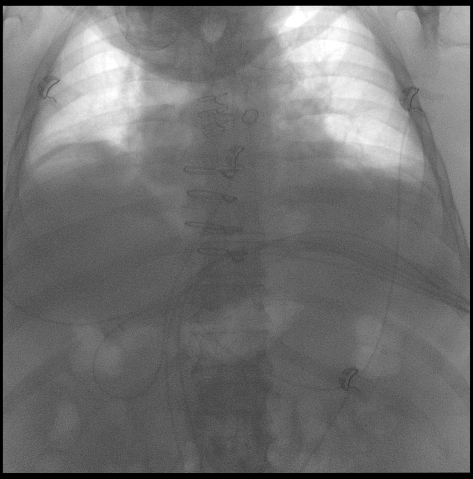
[frame 21/32]
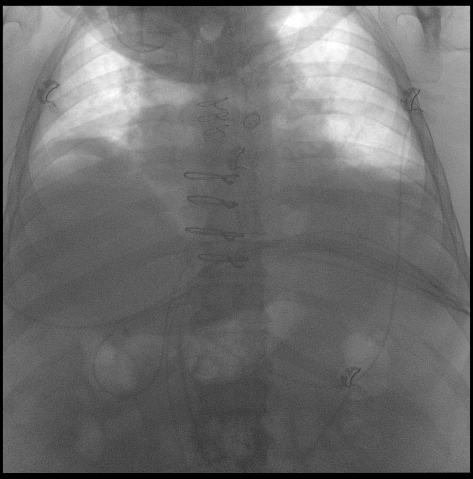
[frame 28/32]
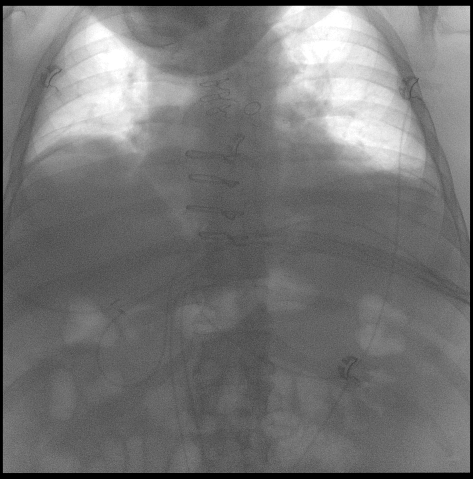

[Series 2: cp_chest · 0.57mm/px · 4 of 26 frames shown (2 of 4)]
[frame 4/26]
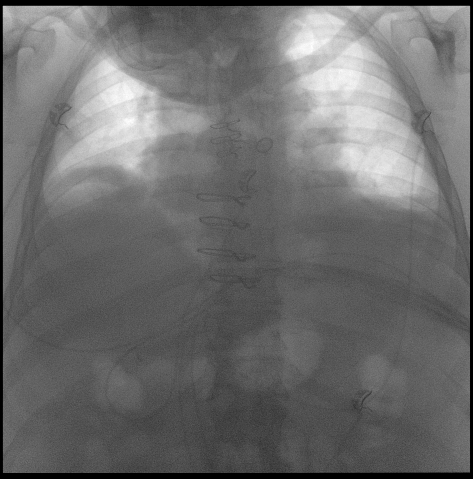
[frame 14/26]
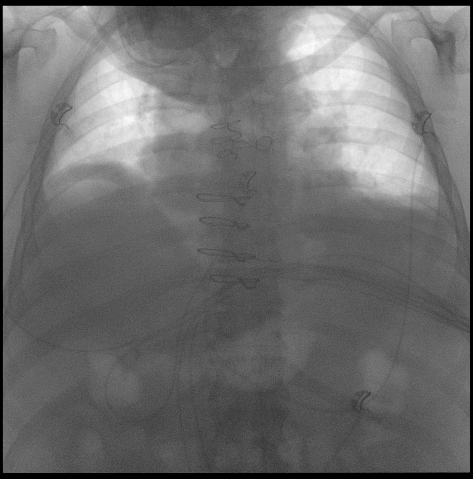
[frame 19/26]
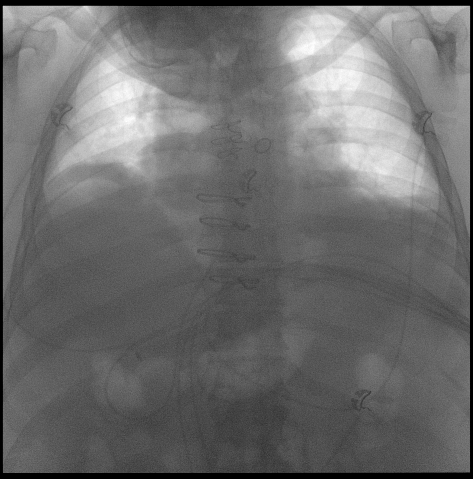
[frame 23/26]
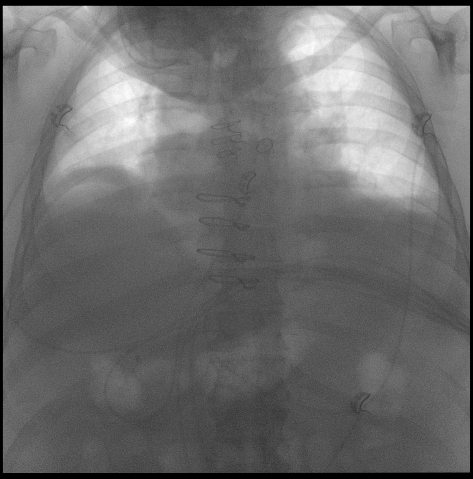

[Series 3: cp_chest · 0.57mm/px · 4 of 52 frames shown (3 of 4)]
[frame 8/52]
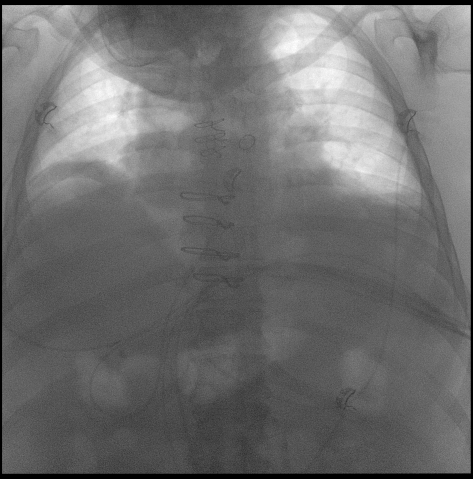
[frame 27/52]
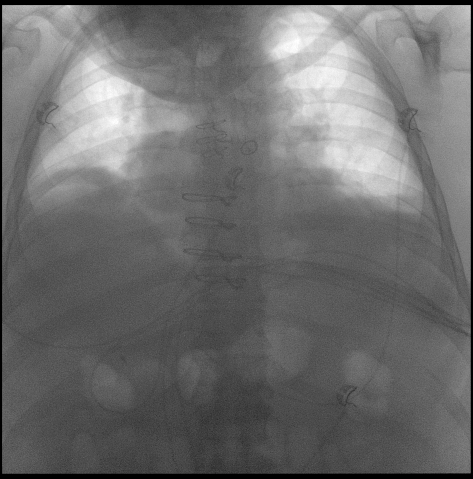
[frame 29/52]
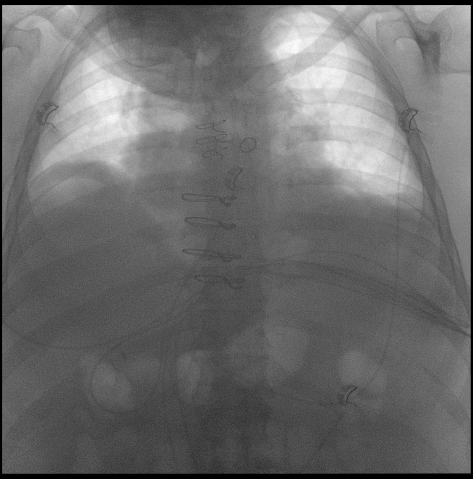
[frame 45/52]
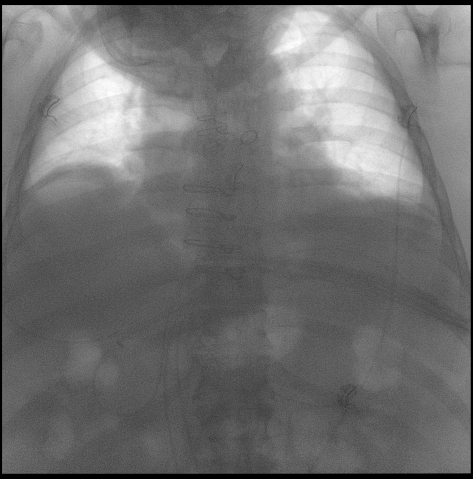

[Series 4: cp_chest · 0.57mm/px · 4 of 18 frames shown (4 of 4)]
[frame 3/18]
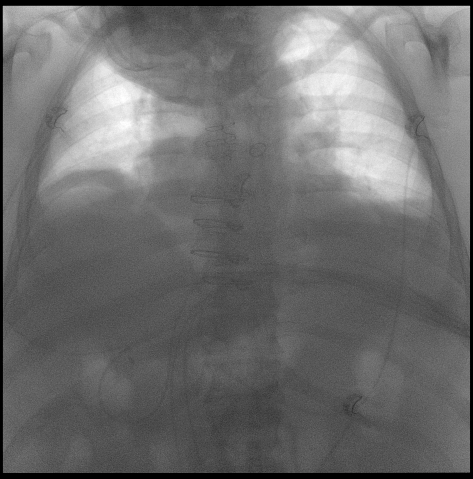
[frame 10/18]
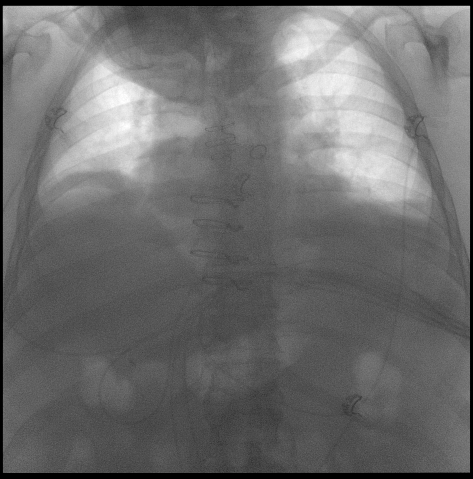
[frame 12/18]
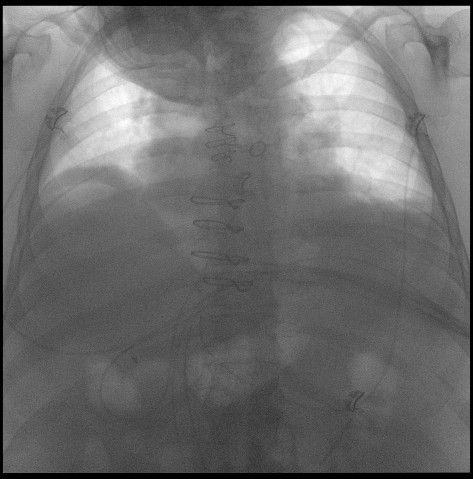
[frame 16/18]
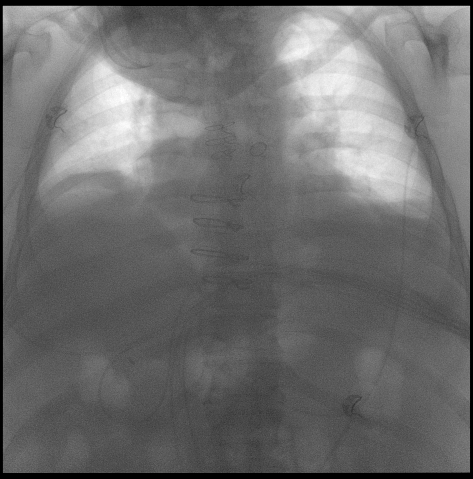

[16 of 16 positions shown; findings below may reference images not displayed]

FINDINGS: Limited motion of the left and right hemidiaphragm with maximal
inspiratory effort. Lung volumes remain low.

Changes of median sternotomy and CABG.
IMPRESSION: Limited motion with low volume chest of the right and left
hemidiaphragm. No signs of paradoxical motion to suggest
diaphragmatic paralysis.

## 2020-08-14 ENCOUNTER — Other Ambulatory Visit: Payer: Self-pay | Admitting: Cardiology

## 2020-08-23 ENCOUNTER — Other Ambulatory Visit: Payer: Self-pay | Admitting: *Deleted

## 2020-08-23 MED ORDER — APIXABAN 5 MG PO TABS: 5.0000 mg | ORAL_TABLET | Freq: Two times a day (BID) | ORAL | 0 refills | Status: DC

## 2020-09-07 ENCOUNTER — Other Ambulatory Visit: Payer: Self-pay | Admitting: Cardiology

## 2020-09-11 ENCOUNTER — Other Ambulatory Visit: Payer: Self-pay | Admitting: Cardiology

## 2020-09-16 DIAGNOSIS — G4733 Obstructive sleep apnea (adult) (pediatric): Secondary | ICD-10-CM | POA: Diagnosis not present

## 2020-09-16 DIAGNOSIS — S27808A Other injury of diaphragm, initial encounter: Secondary | ICD-10-CM | POA: Diagnosis not present

## 2020-09-16 DIAGNOSIS — M1990 Unspecified osteoarthritis, unspecified site: Secondary | ICD-10-CM | POA: Diagnosis not present

## 2020-09-16 DIAGNOSIS — I519 Heart disease, unspecified: Secondary | ICD-10-CM | POA: Diagnosis not present

## 2020-09-16 DIAGNOSIS — F419 Anxiety disorder, unspecified: Secondary | ICD-10-CM | POA: Diagnosis not present

## 2020-09-16 DIAGNOSIS — I4891 Unspecified atrial fibrillation: Secondary | ICD-10-CM | POA: Diagnosis not present

## 2020-09-16 DIAGNOSIS — I10 Essential (primary) hypertension: Secondary | ICD-10-CM | POA: Diagnosis not present

## 2020-09-16 DIAGNOSIS — I219 Acute myocardial infarction, unspecified: Secondary | ICD-10-CM | POA: Diagnosis not present

## 2020-09-16 DIAGNOSIS — F329 Major depressive disorder, single episode, unspecified: Secondary | ICD-10-CM | POA: Diagnosis not present

## 2020-09-16 DIAGNOSIS — M6530 Trigger finger, unspecified finger: Secondary | ICD-10-CM | POA: Diagnosis not present

## 2020-09-16 DIAGNOSIS — S27809A Unspecified injury of diaphragm, initial encounter: Secondary | ICD-10-CM | POA: Diagnosis not present

## 2020-09-17 ENCOUNTER — Telehealth: Payer: Self-pay | Admitting: Cardiology

## 2020-09-17 NOTE — Telephone Encounter (Signed)
New message    Patient calling the office for samples of medication:   1.  What medication and dosage are you requesting samples for? eliquis  2.  Are you currently out of this medication?  Yes and he is in the donut hole     

## 2020-09-18 MED ORDER — APIXABAN 5 MG PO TABS: 5.0000 mg | ORAL_TABLET | Freq: Two times a day (BID) | ORAL | 0 refills | Status: DC

## 2020-09-18 NOTE — Telephone Encounter (Signed)
Pt called wanting to check on the status of samples for his Eliquis.  Also stated he has another question about his medications

## 2020-09-18 NOTE — Telephone Encounter (Signed)
Advised that eliquis samples are available for pick up. 

## 2020-10-07 ENCOUNTER — Telehealth: Payer: Self-pay | Admitting: Cardiology

## 2020-10-07 MED ORDER — HYDRALAZINE HCL 50 MG PO TABS: 75.0000 mg | ORAL_TABLET | Freq: Three times a day (TID) | ORAL | 1 refills | Status: DC

## 2020-10-07 NOTE — Telephone Encounter (Signed)
*  STAT* If patient is at the pharmacy, call can be transferred to refill team.   1. Which medications need to be refilled? (please list name of each medication and dose if known)  hydrALAZINE (APRESOLINE) 50    2. Which pharmacy/location (including street and city if local pharmacy) is [facility]  [place], [place]   3. Do they need a 30 day or 90 day supply?

## 2020-11-20 ENCOUNTER — Encounter: Payer: Self-pay | Admitting: Cardiology

## 2020-11-20 ENCOUNTER — Ambulatory Visit (INDEPENDENT_AMBULATORY_CARE_PROVIDER_SITE_OTHER): Payer: Medicare Other | Admitting: Cardiology

## 2020-11-20 VITALS — BP 116/70 | HR 60 | Ht 66.0 in | Wt 220.0 lb

## 2020-11-20 DIAGNOSIS — I10 Essential (primary) hypertension: Secondary | ICD-10-CM

## 2020-11-20 DIAGNOSIS — I2510 Atherosclerotic heart disease of native coronary artery without angina pectoris: Secondary | ICD-10-CM | POA: Diagnosis not present

## 2020-11-20 DIAGNOSIS — I4891 Unspecified atrial fibrillation: Secondary | ICD-10-CM

## 2020-11-20 MED ORDER — APIXABAN 5 MG PO TABS: 5.0000 mg | ORAL_TABLET | Freq: Two times a day (BID) | ORAL | 0 refills | Status: DC

## 2020-11-20 NOTE — Progress Notes (Signed)
Clinical Summary Mr. Kolb is a 70 y.o.male seen today for follow up of the following medical problems.     1. HTN   - bp's in clinic typically elevated, suspect some white coat HTN - he checks at home regularly. Had been 120s-140s until he had to stop chlorthalidone 37.5mg  daily for gout flare . Since that time bp's have been difficult to control, has had some reported symptoms - currently on dilt 360, eplerenone 50mg  (gynecomastiaon aldactone), irbesartan 300. Headaches on hydralazine, stopped.Started on prazosin due to lack of other options, stopped due to severe dizziness. Dilt prevoiusly lowered from 360 to 180 for possible fatigue and headaches.Off prazosin due to dizziness  -issues with gout on chlorthalidone.   - home bp's much improved with 30 lbs weight loss since last year  - eplerenone is expensive.  - compliant with meds   3. CAD  - prior 2 vessel CABG in 2004 at [facility]  - apparently had follow up cath in 2006 from [facility] with patent grafts. Reports negative stress test in 2011 by Dr Hyacinth Meeker in [place]. 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 OSA  - compliant with CPAP - followed by Dr Vassie Loll.   5. Hyperlipidemia  - noted some side effects on higher lipitor doses, has tolerated lower dose well  05/2020 TC 173 TG 131 HDL 45 LDL 102  6. OSA  - compliant with CPAP  7. Afib - s/p ablation. He had failed rate control as well as multiple antiarrhythmics - no recent palpitations. No bleeding on eliquis  - recurrent afib 02/2018 - 05/2018 repeat ablation.   - rare palpitations at times - no bleedign on eliquis.  Past Medical History:  Diagnosis Date  . Anxiety   . CHF (congestive heart  failure) (HCC)    h/o due to fluid overload in recovery room  . Coronary artery disease    2v CABG, 2004 [facility])  . COVID-19   . DJD (degenerative joint disease)   . Dyspnea    on exertion  . GERD (gastroesophageal reflux disease)   . History of kidney stones    3 stones yrs. ago  . HLD (hyperlipidemia)   . Hypertension   . MI, old 2004  . OA (osteoarthritis)   . Obesity   . Persistent atrial fibrillation (HCC)    failed medical therapy with sotalol, s/p PVI x 2  . Sleep apnea    on CPAP  . Spinal stenosis   . Typical atrial flutter (HCC)      Allergies  Allergen Reactions  . Vancomycin Anaphylaxis, Shortness Of Breath and Other (See Comments)    Was informed after heart surgery Breathing problem patient unsure  . Aldactone [Spironolactone]     GYNECOMASTIA   . Chlorthalidone Other (See Comments)    GOUT  . Nsaids Other (See Comments)    On blood thinner     Current Outpatient Medications  Medication Sig Dispense Refill  . acetaminophen (TYLENOL) 325 MG tablet Take 2 tablets (650 mg total) by mouth every 4 (four) hours as needed for mild pain ((score 1 to 3) or temp > 100.5).    Marland Kitchen apixaban (ELIQUIS) 5 MG TABS tablet Take 1 tablet (5 mg total) by mouth  2 (two) times daily. 56 tablet 0  . atorvastatin (LIPITOR) 20 MG tablet Take 1 tablet (20 mg total) by mouth daily. 90 tablet 1  . Coenzyme Q10 (CO Q 10 PO) Take 400 mg by mouth daily.     Marland Kitchen diltiazem (CARDIZEM CD) 180 MG 24 hr capsule TAKE 1 CAPSULE BY MOUTH EVERY DAY 90 capsule 3  . eplerenone (INSPRA) 25 MG tablet TAKE 1 TABLET BY MOUTH EVERY DAY 90 tablet 1  . furosemide (LASIX) 20 MG tablet Take 2 tablets (40 mg total) by mouth daily. 60 tablet 6  . hydrALAZINE (APRESOLINE) 50 MG tablet Take 1.5 tablets (75 mg total) by mouth 3 (three) times daily. 405 tablet 1  . irbesartan (AVAPRO) 300 MG tablet TAKE 1 TABLET BY MOUTH EVERY DAY 90 tablet 3  . LORazepam (ATIVAN) 0.5 MG tablet Take 1 tablet (0.5 mg total) by mouth  4 (four) times daily. 120 tablet 0  . metoprolol tartrate (LOPRESSOR) 25 MG tablet Take 0.5 tablets (12.5 mg total) by mouth 2 (two) times daily. 60 tablet 1  . Multiple Vitamin (MULTIVITAMIN) tablet Take 1 tablet by mouth daily.    . nitroGLYCERIN (NITROSTAT) 0.4 MG SL tablet Place 1 tablet (0.4 mg total) under the tongue every 5 (five) minutes x 3 doses as needed for chest pain. 25 tablet 1  . omeprazole (PRILOSEC) 20 MG capsule TAKE 1 CAPSULE BY MOUTH EVERY OTHER DAY. 45 capsule 3  . PRESCRIPTION MEDICATION Pt uses CPAP machine at bedtime    . sertraline (ZOLOFT) 50 MG tablet Take 1 tablet (50 mg total) by mouth daily. 30 tablet 1   No current facility-administered medications for this visit.     Past Surgical History:  Procedure Laterality Date  . ANTERIOR CERVICAL DECOMP/DISCECTOMY FUSION [name] 09/21/2019   Procedure: ANTERIOR CERVICAL DECOMPRESSION/DISCECTOMY FUSION CERVICAL THREE- CERVICAL FOUR, CERVICAL FOUR- CERVICAL FIVE, CERVICAL FIVE- CERVICAL SIX;  Surgeon: Tressie Stalker, MD;  Location: [facility] OR;  Service: Neurosurgery;  Laterality: [name];  ANTERIOR CERVICAL DECOMPRESSION/DISCECTOMY FUSION CERVICAL THREE- CERVICAL FOUR, CERVICAL FOUR- CERVICAL FIVE, CERVICAL FIVE- CERVICAL SIX  . ATRIAL FIBRILLATION ABLATION [name] 05/24/2018   Procedure: ATRIAL FIBRILLATION ABLATION;  Surgeon: Hillis Range, MD;  Location: [facility] INVASIVE CV LAB;  Service: Cardiovascular;  Laterality: [name];  . BACK SURGERY  2011  . CARPAL TUNNEL RELEASE     bilateral  . CHOLECYSTECTOMY    . CORONARY ARTERY BYPASS GRAFT     2004  . ELECTROPHYSIOLOGIC STUDY [name] 09/15/2016   Procedure: Atrial Fibrillation Ablation;  Surgeon: Hillis Range, MD;  Location: [facility] INVASIVE CV LAB;  Service: Cardiovascular;  Laterality: [name];  . fistula surgery    . hemorrhoidectomy    . LEFT HEART CATH AND CORS/GRAFTS ANGIOGRAPHY [name] 02/07/2018   Procedure: LEFT HEART CATH AND CORS/GRAFTS ANGIOGRAPHY;  Surgeon: [name], Peter M, MD;  Location: [facility]  INVASIVE CV LAB;  Service: Cardiovascular;  Laterality: [name];  . LUMBAR LAMINECTOMY    . right foot fracture    . TEE WITHOUT CARDIOVERSION [name] 09/15/2016   Procedure: TRANSESOPHAGEAL ECHOCARDIOGRAM (TEE);  Surgeon: Lewayne Bunting, MD;  Location: [facility] ENDOSCOPY;  Service: Cardiovascular;  Laterality: [name];  . TOTAL KNEE ARTHROPLASTY Left 07/07/2017   Procedure: LEFT TOTAL KNEE ARTHROPLASTY;  Surgeon: Ranee Gosselin, MD;  Location: [facility] ORS;  Service: Orthopedics;  Laterality: Left;  . TRANSTHORACIC ECHOCARDIOGRAM  08/25/2010   EF 55-60%     Allergies  Allergen Reactions  . Vancomycin Anaphylaxis, Shortness Of Breath and Other (See Comments)  Was informed after heart surgery Breathing problem patient unsure  . Aldactone [Spironolactone]     GYNECOMASTIA   . Chlorthalidone Other (See Comments)    GOUT  . Nsaids Other (See Comments)    On blood thinner      Family History  Problem Relation Age of Onset  . Breast cancer Mother   . Hypertension Mother   . Coronary artery disease Father   . Stroke Father   . Hypertension Father      Social History Mr. Koskinen reports that he has never smoked. He has never used smokeless tobacco. Mr. Geraghty reports current alcohol use.   Review of Systems CONSTITUTIONAL: No weight loss, fever, chills, weakness or fatigue.  HEENT: Eyes: No visual loss, blurred vision, double vision or yellow sclerae.No hearing loss, sneezing, congestion, runny nose or sore throat.  SKIN: No rash or itching.  CARDIOVASCULAR: per hpi RESPIRATORY: No shortness of breath, cough or sputum.  GASTROINTESTINAL: No anorexia, nausea, vomiting or diarrhea. No abdominal pain or blood.  GENITOURINARY: No burning on urination, no polyuria NEUROLOGICAL: No headache, dizziness, syncope, paralysis, ataxia, numbness or tingling in the extremities. No change in bowel or bladder control.  MUSCULOSKELETAL: No muscle, back pain, joint pain or stiffness.  LYMPHATICS: No enlarged  nodes. No history of splenectomy.  PSYCHIATRIC: No history of depression or anxiety.  ENDOCRINOLOGIC: No reports of sweating, cold or heat intolerance. No polyuria or polydipsia.  Marland Kitchen   Physical Examination Today's Vitals   11/20/20 1407  BP: 116/70  Pulse: 60  SpO2: 98%  Weight: 220 lb (99.8 kg)  Height: 5\' 6"  (1.676 m)   Body mass index is 35.51 kg/m.  Gen: resting comfortably, no acute distress HEENT: no scleral icterus, pupils equal round and reactive, no palptable cervical adenopathy,  CV: RRR, no mr/g, no jvd Resp: Clear to auscultation bilaterally GI: abdomen is soft, non-tender, non-distended, normal bowel sounds, no hepatosplenomegaly MSK: extremities are warm, no edema.  Skin: warm, no rash Neuro:  no focal deficits Psych: appropriate affect   Diagnostic Studies 03/2015 echo Study Conclusions  - Left ventricle: The cavity size was normal. Wall thickness was increased in a pattern of mild LVH. Systolic function was normal. The estimated ejection fraction was in the range of 50% to 55%. Wall motion was normal; there were no regional wall motion abnormalities. Doppler parameters are consistent with high ventricular filling pressure. - Left atrium: The atrium was mildly dilated.   03/2015 Lexiscan MPI  No T wave inversion was noted during stress.  There was no ST segment deviation noted during stress.  The study is normal.  This is a low risk study.  The left ventricular ejection fraction is normal (55-65%).  Nuclear stress EF: 61%.  02/2018 cath  Prox LAD lesion is 100% stenosed.  Prox Cx to Mid Cx lesion is 60% stenosed.  Post Atrio lesion is 100% stenosed.  SVG graft was visualized by angiography and is large.  The graft exhibits minimal luminal irregularities.  LIMA graft was visualized by angiography and is normal in caliber.  The graft exhibits no disease.  The left ventricular systolic function is normal.  LV end diastolic  pressure is normal.  The left ventricular ejection fraction is 55-65% by visual estimate.  1. 2 vessel occlusive CAD - 100% proximal LAD - 60% mid LCx - 100% PLOM with left to right collaterals. 2. Patent LIMA to the LAD 3. Patent SVG to the diagonal 4. Normal LV function 5. Normal LVEDP  12/2018 heart monitor  48 hr holter monitor  Min HR 46, Max HR 106, Avg HR 62  Rare ventricular ectopy, all in the form of isolated PVCs  Occasional supraventricular ectopy, primarily as isolated PACs. One 3 beat run of atach.  No significant arrhythmias     Assessment and Plan  1. HTN -much improved since his 30 lbs weight loss - eplerenone is expensive, sicne bp's improved will try coming off of. Had gynecomastia on aldactone.   2. Afib - denies symptoms, continue current meds  3. CAD - denies any symptoms, continue current meds     Antoine Poche, M.D.

## 2020-11-20 NOTE — Patient Instructions (Addendum)
Your physician wants you to follow-up in: 6 MONTHS WITH DR [facility] You will receive a reminder letter in the mail two months in advance. If you don't receive a letter, please call our office to schedule the follow-up appointment.  Your physician has recommended you make the following change in your medication:   STOP INSPRA   Thank you for choosing [facility]!!

## 2020-12-09 ENCOUNTER — Ambulatory Visit: Payer: Medicare Other | Admitting: Physician Assistant

## 2020-12-17 ENCOUNTER — Other Ambulatory Visit: Payer: Self-pay | Admitting: Cardiology

## 2021-01-06 ENCOUNTER — Other Ambulatory Visit: Payer: Self-pay | Admitting: Cardiology

## 2021-02-20 ENCOUNTER — Emergency Department (HOSPITAL_COMMUNITY)
Admission: EM | Admit: 2021-02-20 | Discharge: 2021-02-21 | Disposition: A | Discharge status: Home / Self Care | Payer: Medicare Other | Attending: Emergency Medicine | Admitting: Emergency Medicine

## 2021-02-20 ENCOUNTER — Encounter (HOSPITAL_COMMUNITY): Payer: Self-pay | Admitting: *Deleted

## 2021-02-20 ENCOUNTER — Other Ambulatory Visit: Payer: Self-pay

## 2021-02-20 ENCOUNTER — Emergency Department (HOSPITAL_COMMUNITY): Payer: Medicare Other

## 2021-02-20 DIAGNOSIS — I4891 Unspecified atrial fibrillation: Secondary | ICD-10-CM | POA: Insufficient documentation

## 2021-02-20 DIAGNOSIS — R1013 Epigastric pain: Secondary | ICD-10-CM | POA: Insufficient documentation

## 2021-02-20 DIAGNOSIS — R1012 Left upper quadrant pain: Secondary | ICD-10-CM | POA: Diagnosis present

## 2021-02-20 DIAGNOSIS — Z8616 Personal history of COVID-19: Secondary | ICD-10-CM | POA: Insufficient documentation

## 2021-02-20 DIAGNOSIS — Z96652 Presence of left artificial knee joint: Secondary | ICD-10-CM | POA: Diagnosis not present

## 2021-02-20 DIAGNOSIS — R0682 Tachypnea, not elsewhere classified: Secondary | ICD-10-CM | POA: Diagnosis not present

## 2021-02-20 DIAGNOSIS — I110 Hypertensive heart disease with heart failure: Secondary | ICD-10-CM | POA: Insufficient documentation

## 2021-02-20 DIAGNOSIS — I2510 Atherosclerotic heart disease of native coronary artery without angina pectoris: Secondary | ICD-10-CM | POA: Diagnosis not present

## 2021-02-20 DIAGNOSIS — I509 Heart failure, unspecified: Secondary | ICD-10-CM | POA: Insufficient documentation

## 2021-02-20 DIAGNOSIS — Z951 Presence of aortocoronary bypass graft: Secondary | ICD-10-CM | POA: Insufficient documentation

## 2021-02-20 DIAGNOSIS — Z79899 Other long term (current) drug therapy: Secondary | ICD-10-CM | POA: Diagnosis not present

## 2021-02-20 DIAGNOSIS — Z7901 Long term (current) use of anticoagulants: Secondary | ICD-10-CM | POA: Insufficient documentation

## 2021-02-20 LAB — CBC WITH DIFFERENTIAL/PLATELET
Abs Immature Granulocytes: 0.17 10*3/uL — ABNORMAL HIGH (ref 0.00–0.07)
Basophils Absolute: 0.1 10*3/uL (ref 0.0–0.1)
Basophils Relative: 1 %
Eosinophils Absolute: 0 10*3/uL (ref 0.0–0.5)
Eosinophils Relative: 0 %
HCT: 47.6 % (ref 39.0–52.0)
Hemoglobin: 15.8 g/dL (ref 13.0–17.0)
Immature Granulocytes: 1 %
Lymphocytes Relative: 10 %
Lymphs Abs: 1.3 10*3/uL (ref 0.7–4.0)
MCH: 29.3 pg (ref 26.0–34.0)
MCHC: 33.2 g/dL (ref 30.0–36.0)
MCV: 88.1 fL (ref 80.0–100.0)
Monocytes Absolute: 0.9 10*3/uL (ref 0.1–1.0)
Monocytes Relative: 7 %
Neutro Abs: 10.8 10*3/uL — ABNORMAL HIGH (ref 1.7–7.7)
Neutrophils Relative %: 81 %
Platelets: 341 10*3/uL (ref 150–400)
RBC: 5.4 MIL/uL (ref 4.22–5.81)
RDW: 13.2 % (ref 11.5–15.5)
WBC: 13.3 10*3/uL — ABNORMAL HIGH (ref 4.0–10.5)
nRBC: 0 % (ref 0.0–0.2)

## 2021-02-20 LAB — URINALYSIS, ROUTINE W REFLEX MICROSCOPIC
Bilirubin Urine: NEGATIVE
Glucose, UA: NEGATIVE mg/dL
Hgb urine dipstick: NEGATIVE
Ketones, ur: NEGATIVE mg/dL
Nitrite: NEGATIVE
Protein, ur: NEGATIVE mg/dL
Specific Gravity, Urine: 1.015 (ref 1.005–1.030)
pH: 5 (ref 5.0–8.0)

## 2021-02-20 LAB — COMPREHENSIVE METABOLIC PANEL
ALT: 17 U/L (ref 0–44)
AST: 22 U/L (ref 15–41)
Albumin: 3.9 g/dL (ref 3.5–5.0)
Alkaline Phosphatase: 88 U/L (ref 38–126)
Anion gap: 9 (ref 5–15)
BUN: 19 mg/dL (ref 8–23)
CO2: 24 mmol/L (ref 22–32)
Calcium: 10 mg/dL (ref 8.9–10.3)
Chloride: 104 mmol/L (ref 98–111)
Creatinine, Ser: 0.98 mg/dL (ref 0.61–1.24)
GFR, Estimated: >60 mL/min (ref >60)
Glucose, Bld: 112 mg/dL — ABNORMAL HIGH (ref 70–99)
Potassium: 3.7 mmol/L (ref 3.5–5.1)
Sodium: 137 mmol/L (ref 135–145)
Total Bilirubin: 0.7 mg/dL (ref 0.3–1.2)
Total Protein: 7.6 g/dL (ref 6.5–8.1)

## 2021-02-20 LAB — TROPONIN I (HIGH SENSITIVITY)
Troponin I (High Sensitivity): 11 ng/L (ref <18)
Troponin I (High Sensitivity): 13 ng/L (ref <18)

## 2021-02-20 LAB — LIPASE, BLOOD: Lipase: 29 U/L (ref 11–51)

## 2021-02-20 MED ORDER — TRAMADOL HCL 50 MG PO TABS: 50.0000 mg | ORAL_TABLET | Freq: Four times a day (QID) | ORAL | 0 refills | Status: DC | PRN

## 2021-02-20 MED ORDER — FENTANYL CITRATE (PF) 100 MCG/2ML IJ SOLN
50.0000 ug | Freq: Once | INTRAMUSCULAR | Status: AC
  Administered 2021-02-20: 50 ug via INTRAVENOUS
  Filled 2021-02-20: qty 2

## 2021-02-20 MED ORDER — METOCLOPRAMIDE HCL 10 MG PO TABS
10.0000 mg | ORAL_TABLET | Freq: Once | ORAL | Status: AC
  Administered 2021-02-20: 10 mg via ORAL
  Filled 2021-02-20: qty 1

## 2021-02-20 NOTE — ED Notes (Signed)
Pt had to go to [facility] after registation

## 2021-02-20 NOTE — Discharge Instructions (Addendum)
Your lab tests and imaging including your CT scan are negative for acute cause of your symptoms.  Plan follow up with your primary MD if your symptoms return or worsen.  You may take the medicine prescribed if needed for pain relief - this will make you drowsy - do not drive within 4 hours of taking this medication.

## 2021-02-20 NOTE — ED Triage Notes (Signed)
Pt with left upper abd pain with nausea, denies emesis for months per wife.

## 2021-02-20 NOTE — ED Notes (Signed)
Pt returned from CT, provided phone to reach wife. Requested graham crackers, made aware we are awaiting CT results at this time.

## 2021-02-20 NOTE — ED Notes (Signed)
Patient transported to CT 

## 2021-02-20 NOTE — ED Provider Notes (Signed)
[facility] EMERGENCY DEPARTMENT Provider Note   CSN: [id number] Arrival date & time: 02/20/21  1436     History Chief Complaint  Patient presents with  . Abdominal Pain    [name] [name] is a 70 y.o. 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  He was using a riding mower to mow his lawn when he developed worsening left upper quadrant pain which he suspects is triggered by movement and vibration from the machine.  He endorses that the abdominal pain is also chronic and started around the time of his back surgery as well.  Was referred to GI, but was unable to tolerate scoping procedures as he cannot lie flat without becoming short of breath chronically.  Uses a CPAP for sleep.  He  also has significant shortness of breath and chest tightness but denies chest pain.  The symptoms started after the abdominal pain worsened.  He reports he has daily abdominal pain which is usually low-grade and tolerable, it became severe while he was mowing the lawn.  He denies nausea or vomiting, denies cough.  He also denies diarrhea or constipation, last bowel movement was yesterday and normal.  He he took a dose of Bentyl and Carafate prior to arrival without improvement in symptoms.   The history is provided by the patient.       Past Medical History:  Diagnosis Date  . Anxiety   . CHF (congestive heart failure) (HCC)    h/o due to fluid overload in recovery room  . Coronary artery disease    2v CABG, 2004 [facility])  . COVID-19   . DJD (degenerative joint disease)   . Dyspnea    on exertion  . GERD (gastroesophageal reflux  disease)   . History of kidney stones    3 stones yrs. ago  . HLD (hyperlipidemia)   . Hypertension   . MI, old 2004  . OA (osteoarthritis)   . Obesity   . Persistent atrial fibrillation (HCC)    failed medical therapy with sotalol, s/p PVI x 2  . Sleep apnea    on CPAP  . Spinal stenosis   . Typical atrial flutter [facility])     Patient Active Problem List   Diagnosis Date Noted  . 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  . Cervical myelopathy (HCC) 10/03/2019  . Myelopathy concurrent with and due to spinal stenosis of cervical region (HCC) 09/21/2019  . Paroxysmal atrial fibrillation (HCC) 05/24/2018  . Unstable angina (HCC) 02/04/2018  . Typical atrial flutter (HCC)   . Spinal stenosis   . QT prolongation   . Palpitations   . PAF (paroxysmal atrial fibrillation) (HCC)   . OA (osteoarthritis)   . Hypertension   . HLD (hyperlipidemia)   . Edema   . DJD (degenerative joint disease)   . Coronary artery disease   . Chronic chest pain   . 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 A-fib (HCC) 09/15/2016  . Chest pain 09/09/2016  . Obesity 08/22/2016  . Anxiety state 08/22/2016  . Encounter for monitoring sotalol therapy 08/20/2016  . Morbidly obese (HCC) 07/12/2016  . Medical non-compliance 07/12/2016  . Elevated TSH 07/09/2016  . BPH without  urinary obstruction 07/09/2016  . Need for hepatitis C screening test 07/09/2016  . Idiopathic gout 07/09/2016  . Hyperglycemia 08/01/2015  . Atrial fibrillation with RVR (HCC) 03/28/2015  . Routine general medical examination at a health care facility 08/11/2013  . Chronic sinus bradycardia   . Obstructive sleep apnea 09/12/2010  . MYOCARDIAL INFARCTION 09/11/2010  . Spinal stenosis, unspecified region other than cervical 09/11/2010  . Hyperlipidemia with target LDL less than 70 09/08/2010  . Depression 09/08/2010  . Essential hypertension 09/08/2010  . Coronary atherosclerosis 09/08/2010  . Congestive heart failure (HCC)  09/08/2010  . GERD 09/08/2010  . MI, old 10/05/2002    Past Surgical History:  Procedure Laterality Date  . ANTERIOR CERVICAL DECOMP/DISCECTOMY FUSION [name] 09/21/2019   Procedure: ANTERIOR CERVICAL DECOMPRESSION/DISCECTOMY FUSION CERVICAL THREE- CERVICAL FOUR, CERVICAL FOUR- CERVICAL FIVE, CERVICAL FIVE- CERVICAL SIX;  Surgeon: Tressie Stalker, MD;  Location: [facility] OR;  Service: Neurosurgery;  Laterality: [name];  ANTERIOR CERVICAL DECOMPRESSION/DISCECTOMY FUSION CERVICAL THREE- CERVICAL FOUR, CERVICAL FOUR- CERVICAL FIVE, CERVICAL FIVE- CERVICAL SIX  . ATRIAL FIBRILLATION ABLATION [name] 05/24/2018   Procedure: ATRIAL FIBRILLATION ABLATION;  Surgeon: Hillis Range, MD;  Location: [facility] INVASIVE CV LAB;  Service: Cardiovascular;  Laterality: [name];  . BACK SURGERY  2011  . CARPAL TUNNEL RELEASE     bilateral  . CHOLECYSTECTOMY    . CORONARY ARTERY BYPASS GRAFT     2004  . ELECTROPHYSIOLOGIC STUDY [name] 09/15/2016   Procedure: Atrial Fibrillation Ablation;  Surgeon: Hillis Range, MD;  Location: [facility] INVASIVE CV LAB;  Service: Cardiovascular;  Laterality: [name];  . fistula surgery    . hemorrhoidectomy    . LEFT HEART CATH AND CORS/GRAFTS ANGIOGRAPHY [name] 02/07/2018   Procedure: LEFT HEART CATH AND CORS/GRAFTS ANGIOGRAPHY;  Surgeon: [name], Peter M, MD;  Location: [facility] INVASIVE CV LAB;  Service: Cardiovascular;  Laterality: [name];  . LUMBAR LAMINECTOMY    . right foot fracture    . TEE WITHOUT CARDIOVERSION [name] 09/15/2016   Procedure: TRANSESOPHAGEAL ECHOCARDIOGRAM (TEE);  Surgeon: Lewayne Bunting, MD;  Location: [facility] ENDOSCOPY;  Service: Cardiovascular;  Laterality: [name];  . TOTAL KNEE ARTHROPLASTY Left 07/07/2017   Procedure: LEFT TOTAL KNEE ARTHROPLASTY;  Surgeon: Ranee Gosselin, MD;  Location: [facility] ORS;  Service: Orthopedics;  Laterality: Left;  . TRANSTHORACIC ECHOCARDIOGRAM  08/25/2010   EF 55-60%       Family History  Problem Relation Age of Onset  . Breast cancer Mother   . Hypertension Mother   .  Coronary artery disease Father   . Stroke Father   . Hypertension Father     Social History   Tobacco Use  . Smoking status: Never Smoker  . Smokeless tobacco: Never Used  Vaping Use  . Vaping Use: Never used  Substance Use Topics  . Alcohol use: Yes    Alcohol/week: 0.0 standard drinks    Comment: ocassional  . Drug use: No    Home Medications Prior to Admission medications   Medication Sig Start Date End Date Taking? Authorizing Provider  traMADol (ULTRAM) 50 MG tablet Take 1 tablet (50 mg total) by mouth every 6 (six) hours as needed. 02/20/21  Yes Keilin Gamboa, Raynelle Fanning, PA-C  acetaminophen (TYLENOL) 325 MG tablet Take 2 tablets (650 mg total) by mouth every 4 (four) hours as needed for mild pain ((score 1 to 3) or temp > 100.5). 10/16/19   Angiulli, Mcarthur Rossetti, PA-C  apixaban (ELIQUIS) 5 MG TABS tablet Take 1 tablet (5 mg total) by mouth 2 (two) times daily.  11/20/20   Antoine PocheBranch, Jonathan F, MD  atorvastatin (LIPITOR) 20 MG tablet TAKE 1 TABLET BY MOUTH EVERY DAY 12/17/20   Antoine PocheBranch, Jonathan F, MD  Coenzyme Q10 (CO Q 10 PO) Take 400 mg by mouth daily.     [provider]  diltiazem (CARDIZEM CD) 180 MG 24 hr capsule TAKE 1 CAPSULE BY MOUTH EVERY DAY 05/24/20   Antoine PocheBranch, Jonathan F, MD  furosemide (LASIX) 20 MG tablet TAKE 2 TABLETS BY MOUTH EVERY DAY 01/06/21   Antoine PocheBranch, Jonathan F, MD  hydrALAZINE (APRESOLINE) 50 MG tablet Take 1.5 tablets (75 mg total) by mouth 3 (three) times daily. Patient taking differently: Take 75 mg by mouth 2 (two) times daily. 10/07/20   Antoine PocheBranch, Jonathan F, MD  irbesartan (AVAPRO) 300 MG tablet TAKE 1 TABLET BY MOUTH EVERY DAY 09/11/20   Antoine PocheBranch, Jonathan F, MD  LORazepam (ATIVAN) 0.5 MG tablet Take 1 tablet (0.5 mg total) by mouth 4 (four) times daily. 10/16/19   Angiulli, Mcarthur Rossettianiel J, PA-C  metoprolol tartrate (LOPRESSOR) 25 MG tablet Take 0.5 tablets (12.5 mg total) by mouth 2 (two) times daily. 10/16/19   Angiulli, Mcarthur Rossettianiel J, PA-C  Multiple Vitamin (MULTIVITAMIN) tablet Take  1 tablet by mouth daily.    [provider]  nitroGLYCERIN (NITROSTAT) 0.4 MG SL tablet Place 1 tablet (0.4 mg total) under the tongue every 5 (five) minutes x 3 doses as needed for chest pain. 02/07/18   Arty Baumgartneroberts, Lindsay B, NP  omeprazole (PRILOSEC) 20 MG capsule TAKE 1 CAPSULE BY MOUTH EVERY OTHER DAY. 10/16/19   Angiulli, Mcarthur Rossettianiel J, PA-C  PRESCRIPTION MEDICATION Pt uses CPAP machine at bedtime    [provider]  sertraline (ZOLOFT) 100 MG tablet Take 100 mg by mouth daily. 11/18/20   [provider]    Allergies    Vancomycin, Aldactone [spironolactone], Chlorthalidone, and Nsaids  Review of Systems   Review of Systems  Constitutional: Negative for chills and fever.  HENT: Negative for congestion and sore throat.   Eyes: Negative.   Respiratory: Positive for chest tightness and shortness of breath.   Cardiovascular: Negative for chest pain, palpitations and leg swelling.  Gastrointestinal: Positive for abdominal pain. Negative for constipation, diarrhea, nausea and vomiting.  Genitourinary: Negative.   Musculoskeletal: Negative for arthralgias, joint swelling and neck pain.  Skin: Negative.  Negative for rash and wound.  Neurological: Negative for dizziness, weakness, light-headedness, numbness and headaches.  Psychiatric/Behavioral: Negative.   All other systems reviewed and are negative.   Physical Exam Updated Vital Signs BP (!) 149/97   Pulse (!) 58   Resp (!) 22   Ht 5\' 6"  (1.676 m)   Wt 99.8 kg   SpO2 96%   BMI 35.51 kg/m   Physical Exam Vitals and nursing note reviewed.  Constitutional:      Appearance: He is well-developed.  HENT:     Head: Normocephalic and atraumatic.  Eyes:     Conjunctiva/sclera: Conjunctivae normal.  Cardiovascular:     Rate and Rhythm: Normal rate and regular rhythm.     Heart sounds: Normal heart sounds.  Pulmonary:     Effort: Pulmonary effort is normal.     Breath sounds: Normal breath sounds. No stridor. No  wheezing, rhonchi or rales.     Comments: Tachypnea, frequent deep inspirations, difficulty speaking in full sentences.  Abdominal:     General: Abdomen is protuberant. Bowel sounds are normal. There is no distension or abdominal bruit.     Palpations: Abdomen is soft.  Tenderness: There is abdominal tenderness in the epigastric area and left upper quadrant. There is no guarding or rebound.  Musculoskeletal:        General: Normal range of motion.     Cervical back: Normal range of motion.  Skin:    General: Skin is warm and dry.  Neurological:     Mental Status: He is alert.     ED Results / Procedures / Treatments   Labs (all labs ordered are listed, but only abnormal results are displayed) Labs Reviewed  COMPREHENSIVE METABOLIC PANEL - Abnormal; Notable for the following components:      Result Value   Glucose, Bld 112 (*)    All other components within normal limits  URINALYSIS, ROUTINE W REFLEX MICROSCOPIC - Abnormal; Notable for the following components:   Leukocytes,Ua LARGE (*)    Bacteria, UA RARE (*)    All other components within normal limits  CBC WITH DIFFERENTIAL/PLATELET - Abnormal; Notable for the following components:   WBC 13.3 (*)    Neutro Abs 10.8 (*)    Abs Immature Granulocytes 0.17 (*)    All other components within normal limits  URINE CULTURE  LIPASE, BLOOD  TROPONIN I (HIGH SENSITIVITY)  TROPONIN I (HIGH SENSITIVITY)    EKG EKG Interpretation  Date/Time:  Thursday Feb 20 2021 14:48:35 EDT Ventricular Rate:  58 PR Interval:  158 QRS Duration: 90 QT Interval:  436 QTC Calculation: 428 R Axis:   9 Text Interpretation: Sinus bradycardia Artifact Minimal voltage criteria for LVH, may be normal variant ( R in aVL ) Inferior infarct , age undetermined Abnormal ECG Since last tracing Rate slower Nonspecific T wave abnormality Confirmed by Susy Frizzle [phone]) on 02/20/2021 3:12:54 PM   Radiology CT ABDOMEN PELVIS WO CONTRAST  Result  Date: 02/20/2021 CLINICAL DATA:  Left upper quadrant abdominal pain. EXAM: CT ABDOMEN AND PELVIS WITHOUT CONTRAST TECHNIQUE: Multidetector CT imaging of the abdomen and pelvis was performed following the standard protocol without IV contrast. COMPARISON:  None. FINDINGS: Lower chest: Left basilar subsegmental atelectasis is noted. Hepatobiliary: No focal liver abnormality is seen. Status post cholecystectomy. No biliary dilatation. Pancreas: Unremarkable. No pancreatic ductal dilatation or surrounding inflammatory changes. Spleen: Normal in size without focal abnormality. Adrenals/Urinary Tract: Adrenal glands appear normal. No hydronephrosis or renal obstruction is noted. No renal or ureteral calculi are noted. Several small probable hyperdense cysts are seen involving both kidneys. Urinary bladder is unremarkable. Stomach/Bowel: Stomach is within normal limits. Appendix appears normal. No evidence of bowel wall thickening, distention, or inflammatory changes. Vascular/Lymphatic: Aortic atherosclerosis. No enlarged abdominal or pelvic lymph nodes. Reproductive: Prostate is unremarkable. Other: No abdominal wall hernia or abnormality. No abdominopelvic ascites. Musculoskeletal: Multilevel degenerative disc disease is noted in the lumbar spine. Minimal grade 1 anterolisthesis of L5-S1 is noted secondary to bilateral L5 spondylolysis. No acute abnormality is noted. IMPRESSION: Left basilar subsegmental atelectasis. Minimal grade 1 anterolisthesis of L5-S1 secondary to bilateral L5 spondylolysis. No acute abnormality seen in the abdomen or pelvis. Aortic Atherosclerosis (ICD10-I70.0). Electronically Signed   By: Lupita Raider M.D.   On: 02/20/2021 19:36   DG ABD ACUTE 2+V W 1V CHEST  Result Date: 02/20/2021 CLINICAL DATA:  Shortness of breath and chronic left-sided pain, initial encounter EXAM: DG ABDOMEN ACUTE WITH 1 VIEW CHEST COMPARISON:  11/01/2019, 10/05/2019 FINDINGS: Cardiac shadow is mildly enlarged.  Postsurgical changes are noted. The lungs are well aerated bilaterally. Left basilar scarring is noted stable from the prior exam. No bony abnormality  is seen. Scattered large and small bowel gas is noted. No obstructive changes are seen. No free air is noted. No bony abnormality is identified. IMPRESSION: Left basilar scarring stable from the prior exam. No other focal abnormality is noted. Electronically Signed   By: Alcide Clever M.D.   On: 02/20/2021 16:17    Procedures Procedures   Medications Ordered in ED Medications  fentaNYL (SUBLIMAZE) injection 50 mcg (50 mcg Intravenous Given 02/20/21 1533)  metoCLOPramide (REGLAN) tablet 10 mg (10 mg Oral Given 02/20/21 1533)  fentaNYL (SUBLIMAZE) injection 50 mcg (50 mcg Intravenous Given 02/20/21 1856)    ED Course  I have reviewed the triage vital signs and the nursing notes.  Pertinent labs & imaging results that were available during my care of the patient were reviewed by me and considered in my medical decision making (see chart for details).    MDM Rules/Calculators/A&P                          Patient with acute on chronic left upper quadrant pain of unclear etiology.  Labs and imaging reviewed and discussed with patient, stable although he does have an elevated WBC count at 13.3, probably Acute phase reaction to pain.  He also has a large amount of leukocytes in his urine but denies any urinary symptoms.  A urine culture has been ordered.  Patient was symptom-free at time of discharge.  His EKG is stable, delta troponins are negative, doubt this represents ACS.  He was encouraged follow-up with his PCP and/or GI for further evaluation of this chronic intermittent pain. Final Clinical Impression(s) / ED Diagnoses Final diagnoses:  Left upper quadrant abdominal pain    Rx / DC Orders ED Discharge Orders         Ordered    traMADol (ULTRAM) 50 MG tablet  Every 6 hours PRN        02/20/21 1951           Victoriano Lain 02/20/21  2348    Pollyann Savoy, MD 02/21/21 [phone]

## 2021-02-20 NOTE — ED Notes (Signed)
Pt refused repeat vital signs, PIV removed, discharge instructions reviewed, pt D/C'd

## 2021-02-22 LAB — URINE CULTURE: Culture: <10000 — AB

## 2021-03-11 ENCOUNTER — Telehealth: Payer: Self-pay | Admitting: Cardiology

## 2021-03-11 ENCOUNTER — Other Ambulatory Visit: Payer: Self-pay | Admitting: Cardiology

## 2021-03-11 NOTE — Telephone Encounter (Signed)
Patient called stating that he would like to have the prescription Eplerenone 25 mg called to [facility] , [place], [place]  90 day supply

## 2021-03-11 NOTE — Telephone Encounter (Signed)
Call placed to patient as Eplerenone was d/c'd 11/2020 - see note -   Assessment and Plan  1. HTN -much improved since his 30 lbs weight loss - eplerenone is expensive, sicne bp's improved will try coming off of. Had gynecomastia on aldactone.    States that he & [name] discussed this and was told he could go back on this if he felt like he needed to.  States he feels like his BP's are creeping back up some.   Would like 90 day supply sent to [facility] if approved.

## 2021-03-13 MED ORDER — EPLERENONE 25 MG PO TABS: 25.0000 mg | ORAL_TABLET | Freq: Every day | ORAL | 3 refills | Status: DC

## 2021-03-13 MED ORDER — OMEPRAZOLE 20 MG PO CPDR: DELAYED_RELEASE_CAPSULE | ORAL | 3 refills | Status: DC

## 2021-03-13 NOTE — Telephone Encounter (Signed)
Pt's wife called back wanting to check on this Rx for the Eplerenone   And he's needing a refill on his omeprazole (PRILOSEC) 20 MG capsule [[id number]]   [facility] to [facility] on [address]

## 2021-03-13 NOTE — Telephone Encounter (Signed)
Patient notified and verbalized understanding.   [name] Poche, MD  You 45 minutes ago (4:05 PM)    Ok to restart eplerenone 25mg  daily    MD

## 2021-03-30 ENCOUNTER — Other Ambulatory Visit: Payer: Self-pay | Admitting: Cardiology

## 2021-04-18 ENCOUNTER — Telehealth: Payer: Self-pay | Admitting: Cardiology

## 2021-04-18 NOTE — Telephone Encounter (Signed)
Patient informed and verbalized understanding of plan. 

## 2021-04-18 NOTE — Telephone Encounter (Addendum)
Reports chest pain that started yesterday rated 6/10. Did not use nitroglycerin. BP was 88/60 & HR 59. BP later on last night was 127/82 & HR 61. Reports this morning chest felt tight BP 126/84 & HR 63. Denies active chest pain. Reports after cutting grass today BP 95/61 & HR 62 and felt tightness in chest. Denies dizziness, lightheadedness. Reports fatigue. Current weight 225 lbs. Reports staying well hydrated. Medications reviewed. Advised that he needed to use nitroglycerin as needed, and if symptoms got worse to go to the ED for an evaluation. Verbalized understanding of plan.  Has upcoming visit on 05/20/21

## 2021-04-18 NOTE — Telephone Encounter (Signed)
Per phone call from pt's wife- pt has been having problems with BP dropping and having chest pain since last night.   Please call [phone]

## 2021-04-18 NOTE — Telephone Encounter (Signed)
I would recommend an ER evaluation. He has history of coronary artery disease, unlcear if could be something new going on with his heart  Dominga Ferry MD

## 2021-05-20 ENCOUNTER — Encounter: Payer: Self-pay | Admitting: *Deleted

## 2021-05-20 ENCOUNTER — Ambulatory Visit (INDEPENDENT_AMBULATORY_CARE_PROVIDER_SITE_OTHER): Payer: Medicare Other | Admitting: Cardiology

## 2021-05-20 ENCOUNTER — Encounter: Payer: Self-pay | Admitting: Cardiology

## 2021-05-20 VITALS — BP 138/82 | HR 61 | Ht 66.0 in | Wt 222.2 lb

## 2021-05-20 DIAGNOSIS — I2510 Atherosclerotic heart disease of native coronary artery without angina pectoris: Secondary | ICD-10-CM

## 2021-05-20 DIAGNOSIS — I4891 Unspecified atrial fibrillation: Secondary | ICD-10-CM | POA: Diagnosis not present

## 2021-05-20 DIAGNOSIS — I10 Essential (primary) hypertension: Secondary | ICD-10-CM

## 2021-05-20 NOTE — Progress Notes (Signed)
Clinical Summary Mr. Colegrove is a 70 y.o.male seen today for follow up of the following medical problems.         1. HTN     - bp's in clinic typically elevated, suspect some white coat HTN - he checks at home regularly. Had been 120s-140s until he had to stop chlorthalidone 37.5mg  daily for gout flare . Since that time bp's have been difficult to control, has had some reported symptoms - currently on dilt 360, eplerenone 50mg  (gynecomastia on aldactone), irbesartan 300. Headaches on hydralazine, stopped. Started on prazosin due to lack of other options, stopped due to severe dizziness. Dilt prevoiusly lowered from 360 to 180 for possible fatigue and headaches. Off prazosin due to dizziness   - issues with gout on chlorthalidone.      - home bp's much improved with 30 lbs weight loss since last year     Rare SBPs in high 90s, can feel slugish at those times  - he stopped eplerenone x 1 month ago, he was worried about side effects   2. CAD   - prior 2 vessel CABG in 2004 at [facility]   - apparently had follow up cath in 2006 from [facility] with patent grafts. Reports negative stress test in 2011 by Dr 2012 in [place].  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 Aching discomfort, 5/10 in severity - reports ongoing abdominal pains at times as well - reports the symptoms overall are chronic and unchanged     3. OSA   - compliant with CPAP - followed by Dr 04/20/21.      5. Hyperlipidemia   - noted some side effects on higher lipitor doses, has tolerated lower dose well   05/2020 TC 173 TG 131 HDL 45 LDL 102   6. OSA   - compliant with CPAP   7. Afib - s/p  ablation. He had failed rate control as well as multiple antiarrhythmics - no recent palpitations. No bleeding on eliquis   - recurrent afib 02/2018 - 05/2018 repeat ablation.     - no recent palpitations - compliant with meds   Past Medical History:  Diagnosis Date   Anxiety    CHF (congestive heart failure) (HCC)    h/o due to fluid overload in recovery room   Coronary artery disease    2v CABG, 2004 ([facility])   COVID-19    DJD (degenerative joint disease)    Dyspnea    on exertion   GERD (gastroesophageal reflux disease)    History of kidney stones    3 stones yrs. ago   HLD (hyperlipidemia)    Hypertension    MI, old 2004   OA (osteoarthritis)    Obesity    Persistent atrial fibrillation (HCC)    failed medical therapy with sotalol, s/p PVI x 2   Sleep apnea    on CPAP   Spinal stenosis    Typical atrial flutter (HCC)      Allergies  Allergen Reactions   Vancomycin Anaphylaxis, Shortness Of Breath and Other (See Comments)    Was informed after heart surgery Breathing problem  patient unsure   Aldactone [Spironolactone]     GYNECOMASTIA    Chlorthalidone Other (See Comments)    GOUT   Nsaids Other (See Comments)    On blood thinner     Current Outpatient Medications  Medication Sig Dispense Refill   acetaminophen (TYLENOL) 325 MG tablet Take 2 tablets (650 mg total) by mouth every 4 (four) hours as needed for mild pain ((score 1 to 3) or temp > 100.5).     apixaban (ELIQUIS) 5 MG TABS tablet Take 1 tablet (5 mg total) by mouth 2 (two) times daily. 28 tablet 0   atorvastatin (LIPITOR) 20 MG tablet TAKE 1 TABLET BY MOUTH EVERY DAY 90 tablet 3   Coenzyme Q10 (CO Q 10 PO) Take 400 mg by mouth daily.      diltiazem (CARDIZEM CD) 180 MG 24 hr capsule TAKE 1 CAPSULE BY MOUTH EVERY DAY 90 capsule 3   eplerenone (INSPRA) 25 MG tablet Take 1 tablet (25 mg total) by mouth daily. 90 tablet 3   furosemide (LASIX) 20 MG tablet TAKE 2 TABLETS BY MOUTH EVERY DAY 180 tablet  2   hydrALAZINE (APRESOLINE) 50 MG tablet TAKE 1.5 TABLETS (75 MG TOTAL) BY MOUTH 3 (THREE) TIMES DAILY. 405 tablet 1   irbesartan (AVAPRO) 300 MG tablet TAKE 1 TABLET BY MOUTH EVERY DAY 90 tablet 3   LORazepam (ATIVAN) 0.5 MG tablet Take 1 tablet (0.5 mg total) by mouth 4 (four) times daily. 120 tablet 0   metoprolol tartrate (LOPRESSOR) 25 MG tablet Take 0.5 tablets (12.5 mg total) by mouth 2 (two) times daily. (Patient taking differently: Take 25 mg by mouth daily at 6 (six) AM.) 60 tablet 1   Multiple Vitamin (MULTIVITAMIN) tablet Take 1 tablet by mouth daily.     nitroGLYCERIN (NITROSTAT) 0.4 MG SL tablet Place 1 tablet (0.4 mg total) under the tongue every 5 (five) minutes x 3 doses as needed for chest pain. 25 tablet 1   omeprazole (PRILOSEC) 20 MG capsule TAKE 1 CAPSULE BY MOUTH EVERY OTHER DAY. 45 capsule 3   PRESCRIPTION MEDICATION Pt uses CPAP machine at bedtime     sertraline (ZOLOFT) 100 MG tablet Take 100 mg by mouth daily.     traMADol (ULTRAM) 50 MG tablet Take 1 tablet (50 mg total) by mouth every 6 (six) hours as needed. 15 tablet 0   No current facility-administered medications for this visit.     Past Surgical History:  Procedure Laterality Date   ANTERIOR CERVICAL DECOMP/DISCECTOMY FUSION [name] 09/21/2019   Procedure: ANTERIOR CERVICAL DECOMPRESSION/DISCECTOMY FUSION CERVICAL THREE- CERVICAL FOUR, CERVICAL FOUR- CERVICAL FIVE, CERVICAL FIVE- CERVICAL SIX;  Surgeon: Tressie Stalker, MD;  Location: [facility] OR;  Service: Neurosurgery;  Laterality: [name];  ANTERIOR CERVICAL DECOMPRESSION/DISCECTOMY FUSION CERVICAL THREE- CERVICAL FOUR, CERVICAL FOUR- CERVICAL FIVE, CERVICAL FIVE- CERVICAL SIX   ATRIAL FIBRILLATION ABLATION [name] 05/24/2018   Procedure: ATRIAL FIBRILLATION ABLATION;  Surgeon: Hillis Range, MD;  Location: [facility] INVASIVE CV LAB;  Service: Cardiovascular;  Laterality: [name];   BACK SURGERY  2011   CARPAL TUNNEL RELEASE     bilateral   CHOLECYSTECTOMY     CORONARY ARTERY  BYPASS GRAFT     2004   ELECTROPHYSIOLOGIC STUDY [name] 09/15/2016   Procedure: Atrial Fibrillation Ablation;  Surgeon: Hillis Range, MD;  Location: [facility] INVASIVE CV LAB;  Service: Cardiovascular;  Laterality: [name];   fistula surgery     hemorrhoidectomy     LEFT HEART CATH AND CORS/GRAFTS ANGIOGRAPHY [name] 02/07/2018  Procedure: LEFT HEART CATH AND CORS/GRAFTS ANGIOGRAPHY;  Surgeon: [name], Peter M, MD;  Location: [facility] INVASIVE CV LAB;  Service: Cardiovascular;  Laterality: [name];   LUMBAR LAMINECTOMY     right foot fracture     TEE WITHOUT CARDIOVERSION [name] 09/15/2016   Procedure: TRANSESOPHAGEAL ECHOCARDIOGRAM (TEE);  Surgeon: Lewayne Bunting, MD;  Location: [facility] ENDOSCOPY;  Service: Cardiovascular;  Laterality: [name];   TOTAL KNEE ARTHROPLASTY Left 07/07/2017   Procedure: LEFT TOTAL KNEE ARTHROPLASTY;  Surgeon: Ranee Gosselin, MD;  Location: [facility] ORS;  Service: Orthopedics;  Laterality: Left;   TRANSTHORACIC ECHOCARDIOGRAM  08/25/2010   EF 55-60%     Allergies  Allergen Reactions   Vancomycin Anaphylaxis, Shortness Of Breath and Other (See Comments)    Was informed after heart surgery Breathing problem patient unsure   Aldactone [Spironolactone]     GYNECOMASTIA    Chlorthalidone Other (See Comments)    GOUT   Nsaids Other (See Comments)    On blood thinner      Family History  Problem Relation Age of Onset   Breast cancer Mother    Hypertension Mother    Coronary artery disease Father    Stroke Father    Hypertension Father      Social History Mr. Lovings reports that he has never smoked. He has never used smokeless tobacco. Mr. Saiki reports current alcohol use.   Review of Systems CONSTITUTIONAL: No weight loss, fever, chills, weakness or fatigue.  HEENT: Eyes: No visual loss, blurred vision, double vision or yellow sclerae.No hearing loss, sneezing, congestion, runny nose or sore throat.  SKIN: No rash or itching.  CARDIOVASCULAR: per hpi RESPIRATORY: No shortness of  breath, cough or sputum.  GASTROINTESTINAL: No anorexia, nausea, vomiting or diarrhea. No abdominal pain or blood.  GENITOURINARY: No burning on urination, no polyuria NEUROLOGICAL: No headache, dizziness, syncope, paralysis, ataxia, numbness or tingling in the extremities. No change in bowel or bladder control.  MUSCULOSKELETAL: No muscle, back pain, joint pain or stiffness.  LYMPHATICS: No enlarged nodes. No history of splenectomy.  PSYCHIATRIC: No history of depression or anxiety.  ENDOCRINOLOGIC: No reports of sweating, cold or heat intolerance. No polyuria or polydipsia.  Marland Kitchen   Physical Examination Today's Vitals   05/20/21 1121  BP: 138/82  Pulse: 61  SpO2: 94%  Weight: 222 lb 3.2 oz (100.8 kg)  Height: 5\' 6"  (1.676 m)   Body mass index is 35.86 kg/m.  Gen: resting comfortably, no acute distress HEENT: no scleral icterus, pupils equal round and reactive, no palptable cervical adenopathy,  CV: RRR, no m/r/g no jvd Resp: Clear to auscultation bilaterally GI: abdomen is soft, non-tender, non-distended, normal bowel sounds, no hepatosplenomegaly MSK: extremities are warm, no edema.  Skin: warm, no rash Neuro:  no focal deficits Psych: appropriate affect   Diagnostic Studies  03/2015 echo Study Conclusions  - Left ventricle: The cavity size was normal. Wall thickness was   increased in a pattern of mild LVH. Systolic function was normal.   The estimated ejection fraction was in the range of 50% to 55%.   Wall motion was normal; there were no regional wall motion   abnormalities. Doppler parameters are consistent with high   ventricular filling pressure. - Left atrium: The atrium was mildly dilated.     03/2015 Lexiscan MPI No T wave inversion was noted during stress. There was no ST segment deviation noted during stress. The study is normal. This is a low risk study. The left ventricular ejection fraction is  normal (55-65%). Nuclear stress EF: 61%.   02/2018  cath Prox LAD lesion is 100% stenosed. Prox Cx to Mid Cx lesion is 60% stenosed. Post Atrio lesion is 100% stenosed. SVG graft was visualized by angiography and is large. The graft exhibits minimal luminal irregularities. LIMA graft was visualized by angiography and is normal in caliber. The graft exhibits no disease. The left ventricular systolic function is normal. LV end diastolic pressure is normal. The left ventricular ejection fraction is 55-65% by visual estimate.   1. 2 vessel occlusive CAD    - 100% proximal LAD    - 60% mid LCx    - 100% PLOM with left to right collaterals. 2. Patent LIMA to the LAD 3. Patent SVG to the diagonal 4. Normal LV function 5. Normal LVEDP     12/2018 heart monitor 48 hr holter monitor Min HR 46, Max HR 106, Avg HR 62 Rare ventricular ectopy, all in the form of isolated PVCs Occasional supraventricular ectopy, primarily as isolated PACs. One 3 beat run of atach. No significant arrhythmias     Assessment and Plan   1. HTN -much improved since his 30 lbs weight loss - off eplerenone, was concerned about possible side effects - overall bp's are goal, rare SBPs in 90s - continue current meds   2. Afib - no recent symptoms, continue current meds   3. CAD -chronic atypical chest pains - continue to monitor  F/u      Antoine Poche, M.D.

## 2021-05-20 NOTE — Patient Instructions (Signed)
Medication Instructions:  Continue all current medications.   Labwork: none  Testing/Procedures: none  Follow-Up: 6 months   Any Other Special Instructions Will Be Listed Below (If Applicable).   If you need a refill on your cardiac medications before your next appointment, please call your pharmacy.  

## 2021-05-23 ENCOUNTER — Telehealth: Payer: Self-pay | Admitting: *Deleted

## 2021-05-23 NOTE — Telephone Encounter (Signed)
FYI - received fax from pcp - no recent labs available.

## 2021-06-03 ENCOUNTER — Telehealth: Payer: Self-pay | Admitting: Pulmonary Disease

## 2021-06-03 ENCOUNTER — Telehealth: Payer: Self-pay | Admitting: Cardiology

## 2021-06-03 ENCOUNTER — Other Ambulatory Visit: Payer: Self-pay | Admitting: Cardiology

## 2021-06-03 DIAGNOSIS — G4733 Obstructive sleep apnea (adult) (pediatric): Secondary | ICD-10-CM

## 2021-06-03 NOTE — Telephone Encounter (Signed)
I called and spoke with patient regarding Dr. Wynona Neat recs. Patient verbalized understanding and I put in order for in lab sleep study. Nothing further needed.

## 2021-06-03 NOTE — Telephone Encounter (Signed)
He will need a sleep study to be switched over to BiPAP  He can be scheduled for an in lab study to be titrated to BiPAP for CPAP intolerance

## 2021-06-03 NOTE — Telephone Encounter (Signed)
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 Feels like he is not getting enough air from his CPAP and his stomach is staying swollen. Currently has new mask because they stopped making previous mask. States he had quiet a few apnea events last night due to new mask and currently has machine pressure turned down to 5 cm. Patient would like to know if he should get a BIPAP machine over CPAP.  Dr. Wynona Neat please advise on behalf of Dr. Vassie Loll

## 2021-06-16 ENCOUNTER — Telehealth: Payer: Self-pay | Admitting: Pulmonary Disease

## 2021-06-16 DIAGNOSIS — G4733 Obstructive sleep apnea (adult) (pediatric): Secondary | ICD-10-CM

## 2021-06-17 ENCOUNTER — Telehealth: Payer: Self-pay | Admitting: Cardiology

## 2021-06-17 NOTE — Telephone Encounter (Signed)
Order has been placed for the cpap titration study with the comment cpap titration with possible bilevel if needed.  Attempted to call [name] [name] at [facility] but unable to reach. Left a detailed message for [name] [name] letting him know this had been done. Nothing further needed.

## 2021-06-17 NOTE — Telephone Encounter (Signed)
Prescription refill request for Eliquis received. Indication: Atrial fib Last office visit: 05/20/21  Dominga Ferry MD Scr: 0.98 on 02/20/21 Age:  70 Weight: 100.8kg  Based on above findings Eliquis 5mg  twice daily is the appropriate dose.  Refill approved.

## 2021-06-19 NOTE — Telephone Encounter (Addendum)
Spoke to [employer] D- pt would be able to have samples.   Called and spoke to pt who stated that he is in the doughnut hole. He is out of town and will pick up samples next week. Pt has a week left of Eliquis. Informed pt that, somebody from our office should be contacting him to see is he could get help with patient assistance. Informed pt that if he can not get patient assistance and can not afford his medication we will need to be in contact with his Dr to see if patient could be a candidate for another blood thinner.   Pt stated that he will call next week to see about picking up samples.

## 2021-06-19 NOTE — Telephone Encounter (Addendum)
Sorry for got to add the patient contact information .   Do we have any coupon cards or discount cards for Eliquis if we do not have samples?

## 2021-06-19 NOTE — Telephone Encounter (Signed)
Patient calling the office for samples of medication:   1.  What medication and dosage are you requesting samples for? 5 mg 2x a day    2.  Are you currently out of this medication?  Almost - its 426 dollars for him to get the medication refilled

## 2021-06-19 NOTE — Telephone Encounter (Signed)
Duplicate request, please see telephone encounter from 06/17/2021 for further documentation.

## 2021-06-19 NOTE — Telephone Encounter (Addendum)
From patient calls 06/03/2021 Patient Calls (Newest Message First) Roseanne Reno, CMA routed conversation to Louanna Raw, RN 59 minutes ago (3:15 PM)   Alonna Minium J routed conversation to [employer] Triage 1 hour ago (3:07 PM)   Alonna Minium J 1 hour ago (3:06 PM)   NM Patient calling the office for samples of medication:     1.  What medication and dosage are you requesting samples for? 5 mg 2x a day     2.  Are you currently out of this medication?  Almost - its 426 dollars for him to get the medication refilled         Note    Quist,Debra [phone]  Berle Mull 1 hour ago (3:05 PM)   Jearld Pies [phone]  Belva Bertin 2 weeks ago

## 2021-06-20 NOTE — Telephone Encounter (Signed)
**Note De-Identified Kaylany Tesoriero Obfuscation** The pt sees Dr Allyson Sabal at out [facility] office. I am forwarding this message to their triage pool to f/u with pt concerning Eliquis asst through BMSPAF.

## 2021-06-20 NOTE — Telephone Encounter (Signed)
Spoke to patient he has never seen Dr.Berry.Advised this message was sent to wrong office.Stated he is in doughnut hole.He would like Eliquis 5 mg samples.He also needs help to apply for patient assistance.Message sent to Dr.Branch's office triage.

## 2021-06-20 NOTE — Telephone Encounter (Signed)
Per new protocol eliquis request go to Coumadin nurse, L.Azucena Kuba, RN

## 2021-06-23 MED ORDER — APIXABAN 5 MG PO TABS: 5.0000 mg | ORAL_TABLET | Freq: Two times a day (BID) | ORAL | 0 refills | Status: DC

## 2021-06-23 NOTE — Addendum Note (Signed)
Addended by: Lesle Chris on: 06/23/2021 06:38 PM   Modules accepted: Orders

## 2021-06-23 NOTE — Telephone Encounter (Signed)
Patient lives in [place], seen in [place] office. I will forward to that office.

## 2021-06-23 NOTE — Telephone Encounter (Signed)
Pt has already been approved for Eliquis and refills were sent in.  He is asking for samples since he is in the donut hole.  OK to provide samples if available.  Also he needs help with pt assistance application.   Thanks. Misty Stanley

## 2021-06-23 NOTE — Telephone Encounter (Signed)
Patient notified samples are ready for pick up.  BMSPA[employer] application given as well.

## 2021-06-26 ENCOUNTER — Telehealth: Payer: Self-pay | Admitting: Cardiology

## 2021-06-26 NOTE — Telephone Encounter (Signed)
Patient called stating that he needs to speak with Inocencio Homes in regards to forms sent to him for Eliquis assistance.

## 2021-06-27 NOTE — Telephone Encounter (Signed)
Left message to return call 

## 2021-06-30 ENCOUNTER — Ambulatory Visit (INDEPENDENT_AMBULATORY_CARE_PROVIDER_SITE_OTHER): Payer: Medicare Other | Admitting: Pulmonary Disease

## 2021-06-30 ENCOUNTER — Encounter: Payer: Self-pay | Admitting: Pulmonary Disease

## 2021-06-30 ENCOUNTER — Other Ambulatory Visit: Payer: Self-pay

## 2021-06-30 VITALS — BP 124/78 | HR 58 | Temp 98.2°F | Ht 66.0 in | Wt 226.1 lb

## 2021-06-30 DIAGNOSIS — I2510 Atherosclerotic heart disease of native coronary artery without angina pectoris: Secondary | ICD-10-CM

## 2021-06-30 DIAGNOSIS — J986 Disorders of diaphragm: Secondary | ICD-10-CM | POA: Diagnosis not present

## 2021-06-30 DIAGNOSIS — G4733 Obstructive sleep apnea (adult) (pediatric): Secondary | ICD-10-CM | POA: Diagnosis not present

## 2021-06-30 DIAGNOSIS — Z23 Encounter for immunization: Secondary | ICD-10-CM | POA: Diagnosis not present

## 2021-06-30 NOTE — Progress Notes (Signed)
   Subjective:    Patient ID: [name] [name], male    DOB: 1951/09/21, 70 y.o.   MRN: [id number]  HPI 70 yo obese, never smoker for follow-up of severe OSA and severe dyspnea following neck surgery    PMH -OSA, AFIB, CHF, HTN, myocardial infarction, GERD, hyperlipidemia, spinal stenosis.   10/2019 following cervical discectomy and decompression for spinal stenosis. developed diaphragmatic paralysis post neck surgery  Accompanied by wife, complains of persistent shortness of breath especially when he is in the pool with water coming up to his chest.  He also reports shortness of breath with bending down or unusual activity  Also states that when he uses higher pressure in the CPAP up to 7-8 cm, he feels bloated and his stomach hurts in the morning and wonders if he needs BiPAP He has self adjusted his CPAP machine down to 6.6 cm, we reviewed download  Chest x-ray 02/2021 was reviewed which shows mild left hemidiaphragm elevation. CT abdomen/pelvis from 02/2021 was reviewed which shows left basilar atelectasis  Significant tests/ events reviewed Sniff test 12/28 neg CT angiogram chest 1/8 low lung volumes bilateral, raised bilateral hemidiaphragm CT angiogram 1/21 left lower lobe atelectasis/consolidation   PSG 09/2010 RDI 80/h Auto 12/2010:  Optimal cpap 14cm, great compliance   Review of Systems neg for any significant sore throat, dysphagia, itching, sneezing, nasal congestion or excess/ purulent secretions, fever, chills, sweats, unintended wt loss, pleuritic or exertional cp, hempoptysis, orthopnea pnd or change in chronic leg swelling. Also denies presyncope, palpitations, heartburn, abdominal pain, nausea, vomiting, diarrhea or change in bowel or urinary habits, dysuria,hematuria, rash, arthralgias, visual complaints, headache, numbness weakness or ataxia.     Objective:   Physical Exam  Gen. Pleasant, obese, in no distress ENT - no lesions, no post nasal drip Neck: No JVD, no  thyromegaly, no carotid bruits Lungs: no use of accessory muscles, no dullness to percussion, decreased without rales or rhonchi  Cardiovascular: Rhythm regular, heart sounds  normal, no murmurs or gallops, no peripheral edema Musculoskeletal: No deformities, no cyanosis or clubbing , no tremors        Assessment & Plan:

## 2021-06-30 NOTE — Patient Instructions (Signed)
We will see if you need BiPAP during the titration study

## 2021-06-30 NOTE — Assessment & Plan Note (Signed)
CPAP download was reviewed which shows excellent compliance about 9 hours per night with a residual AHI of 1 on average of 6/hour on pressure of 6.6 cm but on certain nights AHI goes as high as 10-30 events per hour Feel that he will do better on auto CPAP settings 5 to 8 cm I explained that EPR is set at 3 cm which essentially makes this on BiPAP with a pressure support of 3 cm. However we have set him up for a titration study in October.  If he does not tolerate CPAP we can switch him to BiPAP during the study  Weight loss encouraged, compliance with goal of at least 4-6 hrs every night is the expectation. Advised against medications with sedative side effects Cautioned against driving when sleepy - understanding that sleepiness will vary on a day to day basis

## 2021-06-30 NOTE — Telephone Encounter (Signed)
Patient returning a call to [facility] from Friday

## 2021-06-30 NOTE — Assessment & Plan Note (Signed)
Although sniff test was negative in the past, he still has mild residual left diaphragm elevation and subjectively symptoms seem to be related to diaphragmatic dysfunction.  Would be curious to see if he ends up needing BiPAP if there is improvement with this

## 2021-07-01 NOTE — Telephone Encounter (Signed)
Discussed application with patient - states that he is not sure that he would qualify due to his annual income.  Suggested that he at least let them evaluate his information just to be sure.  If he gets denied then we can check with provider to see if changing to different anticoagulant may be an option for him.  He verbalized understanding & states he will bring in the next few weeks.  Getting ready to go out of town & will bring after he gets back.

## 2021-07-25 ENCOUNTER — Ambulatory Visit (INDEPENDENT_AMBULATORY_CARE_PROVIDER_SITE_OTHER): Payer: Medicare Other | Admitting: Internal Medicine

## 2021-07-25 ENCOUNTER — Other Ambulatory Visit: Payer: Self-pay

## 2021-07-25 VITALS — BP 130/64 | HR 53 | Ht 66.0 in | Wt 229.0 lb

## 2021-07-25 DIAGNOSIS — I10 Essential (primary) hypertension: Secondary | ICD-10-CM | POA: Diagnosis not present

## 2021-07-25 DIAGNOSIS — I4892 Unspecified atrial flutter: Secondary | ICD-10-CM | POA: Diagnosis not present

## 2021-07-25 DIAGNOSIS — I2510 Atherosclerotic heart disease of native coronary artery without angina pectoris: Secondary | ICD-10-CM | POA: Diagnosis not present

## 2021-07-25 DIAGNOSIS — G4733 Obstructive sleep apnea (adult) (pediatric): Secondary | ICD-10-CM

## 2021-07-25 DIAGNOSIS — I4819 Other persistent atrial fibrillation: Secondary | ICD-10-CM | POA: Diagnosis not present

## 2021-07-25 NOTE — Patient Instructions (Addendum)
Medication Instructions:  Your physician recommends that you continue on your current medications as directed. Please refer to the Current Medication list given to you today. *If you need a refill on your cardiac medications before your next appointment, please call your pharmacy*  Lab Work: None. If you have labs (blood work) drawn today and your tests are completely normal, you will receive your results only by: MyChart Message (if you have MyChart) OR A paper copy in the mail If you have any lab test that is abnormal or we need to change your treatment, we will call you to review the results.  Testing/Procedures: None.  Follow-Up: At [facility], you and your health needs are our priority.  As part of our continuing mission to provide you with exceptional heart care, we have created designated Provider Care Teams.  These Care Teams include your primary Cardiologist (physician) and Advanced Practice Providers (APPs -  Physician Assistants and Nurse Practitioners) who all work together to provide you with the care you need, when you need it.  Your physician wants you to follow-up in: February with [name] as scheduled.   We recommend signing up for the patient portal called "MyChart".  Sign up information is provided on this After Visit Summary.  MyChart is used to connect with patients for Virtual Visits (Telemedicine).  Patients are able to view lab/test results, encounter notes, upcoming appointments, etc.  Non-urgent messages can be sent to your provider as well.   To learn more about what you can do with MyChart, go to [email or web address].    Any Other Special Instructions Will Be Listed Below (If Applicable).

## 2021-07-25 NOTE — Progress Notes (Signed)
PCP: Delorse Lek, FNP Primary Cardiologist: Dr Wyline Mood Primary EP: Dr Johney Frame  [name] [name] is a 70 y.o. male who presents today for routine electrophysiology followup.  Since last being seen in our clinic, the patient reports doing reasonably well.  He has chronic SOB.  He is not very active.   + mild edema.  No afib. Today, he denies symptoms of palpitations, chest pain,  dizziness, presyncope, or syncope.  The patient is otherwise without complaint today.   Past Medical History:  Diagnosis Date   Anxiety    CHF (congestive heart failure) (HCC)    h/o due to fluid overload in recovery room   Coronary artery disease    2v CABG, 2004 ([facility])   COVID-19    DJD (degenerative joint disease)    Dyspnea    on exertion   GERD (gastroesophageal reflux disease)    History of kidney stones    3 stones yrs. ago   HLD (hyperlipidemia)    Hypertension    MI, old 2004   OA (osteoarthritis)    Obesity    Persistent atrial fibrillation (HCC)    failed medical therapy with sotalol, s/p PVI x 2   Sleep apnea    on CPAP   Spinal stenosis    Typical atrial flutter [facility])    Past Surgical History:  Procedure Laterality Date   ANTERIOR CERVICAL DECOMP/DISCECTOMY FUSION [name] 09/21/2019   Procedure: ANTERIOR CERVICAL DECOMPRESSION/DISCECTOMY FUSION CERVICAL THREE- CERVICAL FOUR, CERVICAL FOUR- CERVICAL FIVE, CERVICAL FIVE- CERVICAL SIX;  Surgeon: Tressie Stalker, MD;  Location: [facility] OR;  Service: Neurosurgery;  Laterality: [name];  ANTERIOR CERVICAL DECOMPRESSION/DISCECTOMY FUSION CERVICAL THREE- CERVICAL FOUR, CERVICAL FOUR- CERVICAL FIVE, CERVICAL FIVE- CERVICAL SIX   ATRIAL FIBRILLATION ABLATION [name] 05/24/2018   Procedure: ATRIAL FIBRILLATION ABLATION;  Surgeon: Hillis Range, MD;  Location: [facility] INVASIVE CV LAB;  Service: Cardiovascular;  Laterality: [name];   BACK SURGERY  2011   CARPAL TUNNEL RELEASE     bilateral   CHOLECYSTECTOMY     CORONARY ARTERY BYPASS GRAFT     2004   ELECTROPHYSIOLOGIC  STUDY [name] 09/15/2016   Procedure: Atrial Fibrillation Ablation;  Surgeon: Hillis Range, MD;  Location: [facility] INVASIVE CV LAB;  Service: Cardiovascular;  Laterality: [name];   fistula surgery     hemorrhoidectomy     LEFT HEART CATH AND CORS/GRAFTS ANGIOGRAPHY [name] 02/07/2018   Procedure: LEFT HEART CATH AND CORS/GRAFTS ANGIOGRAPHY;  Surgeon: [name], Peter M, MD;  Location: [facility] INVASIVE CV LAB;  Service: Cardiovascular;  Laterality: [name];   LUMBAR LAMINECTOMY     right foot fracture     TEE WITHOUT CARDIOVERSION [name] 09/15/2016   Procedure: TRANSESOPHAGEAL ECHOCARDIOGRAM (TEE);  Surgeon: Lewayne Bunting, MD;  Location: [facility] ENDOSCOPY;  Service: Cardiovascular;  Laterality: [name];   TOTAL KNEE ARTHROPLASTY Left 07/07/2017   Procedure: LEFT TOTAL KNEE ARTHROPLASTY;  Surgeon: Ranee Gosselin, MD;  Location: [facility] ORS;  Service: Orthopedics;  Laterality: Left;   TRANSTHORACIC ECHOCARDIOGRAM  08/25/2010   EF 55-60%    ROS- all systems are reviewed and negatives except as per HPI above  Current Outpatient Medications  Medication Sig Dispense Refill   acetaminophen (TYLENOL) 325 MG tablet Take 2 tablets (650 mg total) by mouth every 4 (four) hours as needed for mild pain ((score 1 to 3) or temp > 100.5).     apixaban (ELIQUIS) 5 MG TABS tablet Take 1 tablet (5 mg total) by mouth 2 (two) times daily. 42 tablet 0   atorvastatin (  LIPITOR) 20 MG tablet TAKE 1 TABLET BY MOUTH EVERY DAY 90 tablet 3   Coenzyme Q10 (CO Q 10 PO) Take 400 mg by mouth daily.      diltiazem (CARDIZEM CD) 180 MG 24 hr capsule TAKE 1 CAPSULE BY MOUTH EVERY DAY 90 capsule 3   furosemide (LASIX) 20 MG tablet TAKE 2 TABLETS BY MOUTH EVERY DAY 180 tablet 2   hydrALAZINE (APRESOLINE) 50 MG tablet TAKE 1.5 TABLETS (75 MG TOTAL) BY MOUTH 3 (THREE) TIMES DAILY. 405 tablet 1   irbesartan (AVAPRO) 300 MG tablet TAKE 1 TABLET BY MOUTH EVERY DAY 90 tablet 3   LORazepam (ATIVAN) 0.5 MG tablet Take 1 tablet (0.5 mg total) by mouth 4 (four) times daily. 120  tablet 0   metoprolol tartrate (LOPRESSOR) 25 MG tablet Take 0.5 tablets (12.5 mg total) by mouth 2 (two) times daily. (Patient taking differently: Take 25 mg by mouth daily at 6 (six) AM.) 60 tablet 1   Multiple Vitamin (MULTIVITAMIN) tablet Take 1 tablet by mouth daily.     nitroGLYCERIN (NITROSTAT) 0.4 MG SL tablet Place 1 tablet (0.4 mg total) under the tongue every 5 (five) minutes x 3 doses as needed for chest pain. 25 tablet 1   omeprazole (PRILOSEC) 20 MG capsule TAKE 1 CAPSULE BY MOUTH EVERY OTHER DAY. 45 capsule 3   PRESCRIPTION MEDICATION Pt uses CPAP machine at bedtime     sertraline (ZOLOFT) 100 MG tablet Take 100 mg by mouth daily.     No current facility-administered medications for this visit.    Physical Exam: Vitals:   07/25/21 1337  BP: 130/64  Pulse: (!) 53  SpO2: 95%  Weight: 229 lb (103.9 kg)  Height: 5\' 6"  (1.676 m)    GEN- The patient is well appearing, alert and oriented x 3 today.   Head- normocephalic, atraumatic Eyes-  Sclera clear, conjunctiva pink Ears- hearing intact Oropharynx- clear Lungs- Clear to ausculation bilaterally, normal work of breathing Heart- Regular rate and rhythm, no murmurs, rubs or gallops, PMI not laterally displaced GI- soft, NT, ND, + BS Extremities- no clubbing, cyanosis, or edema  Wt Readings from Last 3 Encounters:  07/25/21 229 lb (103.9 kg)  06/30/21 226 lb 1.3 oz (102.5 kg)  05/20/21 222 lb 3.2 oz (100.8 kg)    EKG tracing ordered today is personally reviewed and shows sinus bradycardia  Assessment and Plan:  Persistent atrial fibrillation/ atrial flutter Well controlled post ablation off AAD therapy Chads2vasc score is 3.  He is on eliquis  2. HTN Stable No change required today  3. CAD s/p CABG Stable No change required today  4. OSA Compliant with CPAP  5. Overweight Body mass index is 36.96 kg/m. He has been diligently working on this  6. 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  EP to see as needed  Wyline Mood MD, [facility] 07/25/2021 1:52 PM

## 2021-07-27 ENCOUNTER — Ambulatory Visit (HOSPITAL_BASED_OUTPATIENT_CLINIC_OR_DEPARTMENT_OTHER): Payer: Medicare Other | Attending: Pulmonary Disease | Admitting: Pulmonary Disease

## 2021-07-27 ENCOUNTER — Other Ambulatory Visit: Payer: Self-pay

## 2021-07-27 ENCOUNTER — Encounter (HOSPITAL_BASED_OUTPATIENT_CLINIC_OR_DEPARTMENT_OTHER): Payer: Medicare Other | Admitting: Pulmonary Disease

## 2021-07-27 DIAGNOSIS — G4733 Obstructive sleep apnea (adult) (pediatric): Secondary | ICD-10-CM | POA: Diagnosis present

## 2021-07-27 DIAGNOSIS — G4761 Periodic limb movement disorder: Secondary | ICD-10-CM | POA: Diagnosis not present

## 2021-07-28 ENCOUNTER — Telehealth: Payer: Self-pay | Admitting: Cardiology

## 2021-07-28 NOTE — Telephone Encounter (Signed)
Call returned - must have been old message - no notes in chart or test results to reflect need to contact patient.  He verbalized understanding.

## 2021-07-28 NOTE — Telephone Encounter (Signed)
Says returning call to [facility]. Says he received a call from [place] on Friday 07/25/21 @6 :05 pm)

## 2021-07-28 NOTE — Telephone Encounter (Signed)
Patient states he is returning a call from the office to discuss test results. Please call back

## 2021-07-29 ENCOUNTER — Telehealth: Payer: Self-pay | Admitting: Pulmonary Disease

## 2021-07-29 DIAGNOSIS — G4733 Obstructive sleep apnea (adult) (pediatric): Secondary | ICD-10-CM

## 2021-07-29 NOTE — Procedures (Signed)
Patient Name: [name] [name], [name] [name] Date: 07/27/2021 Gender: Male D.O.B: 03/18/1951 Age (years): 12 Referring Provider: Tomma Lightning MD Height (inches): 66 Interpreting Physician: Cyril Mourning MD, ABSM Weight (lbs): 225 RPSGT: Armen Pickup BMI: 36 MRN: [id number] Neck Size: 18.00 <br> <br> CLINICAL INFORMATION The patient is referred for a BiPAP titration to treat sleep apnea.    Date of NPSG:  09/2010 RDI 80/h   SLEEP STUDY TECHNIQUE As per the AASM Manual for the Scoring of Sleep and Associated Events v2.3 (April 2016) with a hypopnea requiring 4% desaturations.  The channels recorded and monitored were frontal, central and occipital EEG, electrooculogram (EOG), submentalis EMG (chin), nasal and oral airflow, thoracic and abdominal wall motion, anterior tibialis EMG, snore microphone, electrocardiogram, and pulse oximetry. Bilevel positive airway pressure (BPAP) was initiated at the beginning of the study and titrated to treat sleep-disordered breathing.  MEDICATIONS Medications self-administered by patient taken the night of the study : [name]  RESPIRATORY PARAMETERS Optimal IPAP Pressure (cm): 15 AHI at Optimal Pressure (/hr) 0 Optimal EPAP Pressure (cm): 11   Overall Minimal O2 (%): 86.0 Minimal O2 at Optimal Pressure (%): 90.0 SLEEP ARCHITECTURE Start Time: 9:14:03 PM Stop Time: 5:13:03 AM Total Time (min): 479 Total Sleep Time (min): 345 Sleep Latency (min): 0.0 Sleep Efficiency (%): 72.0% REM Latency (min): 0.0 WASO (min): 134.0 Stage N1 (%): 8.4% Stage N2 (%): 35.9% Stage N3 (%): 0.0% Stage R (%): 55.7 Supine (%): 0.00 Arousal Index (/hr): 21.6     CARDIAC DATA The 2 lead EKG demonstrated sinus rhythm. The mean heart rate was 53.4 beats per minute. Other EKG findings include: None.  LEG MOVEMENT DATA The total Periodic Limb Movements of Sleep (PLMS) were 0. The PLMS index was 0.0. A PLMS index of <15 is considered normal in adults.  IMPRESSIONS - An optimal  biPAP pressure was selected for this patient ( 15 / 11cm of water). He could not tolerate CPAP pressure at all. - Moderete oxygen desaturations were observed during this titration (min O2 = 86.0%). - No snoring was audible during this study. - No cardiac abnormalities were observed during this study. - Clinically significant periodic limb movements were not noted during this study. 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 Alternatively autobipap could be used - Evaluate for restlesslegs syndrome including detailed history and irons tudies. - Avoid alcohol, sedatives and other CNS depressants that may worsen sleep apnea and disrupt normal sleep architecture. - Sleep hygiene should be reviewed to assess factors that may improve sleep quality. - Weight management and regular exercise should be initiated or continued. - Return to Sleep Center for re-evaluation after 4 weeks of therapy   Cyril Mourning MD Board Certified in Sleep medicine

## 2021-07-29 NOTE — Telephone Encounter (Signed)
  BiPAP seem to work better for him. Send an order for -Auto BiPAP EPAP minimum 5 cm, pressure support +4 cm, IPAP maximum +15 cm  -Discontinue CPAP once BiPAP initiated.  Office visit with me 30 to 90 days after starting

## 2021-07-30 NOTE — Telephone Encounter (Signed)
Patient checking on results of overnight sleep test. Patient phone number is [phone].

## 2021-07-30 NOTE — Telephone Encounter (Signed)
Called and spoke with patient. He verbalized understanding. He wishes to remain with [facility] as his DME in [place].   Order for bipap has been placed.   Nothing further needed at time of call.

## 2021-08-07 ENCOUNTER — Telehealth: Payer: Self-pay | Admitting: Pulmonary Disease

## 2021-08-08 NOTE — Telephone Encounter (Signed)
I called [facility] and spoke to [facility].  She states they do have order and everything needed and pt was approved today.  She said he should be hearing from them soon.  I called pt & left him vm to make him aware they do have order and it has been approved and he should be hearing from them soon.  Left him my phone # if he has any questions.  Nothing further needed.

## 2021-08-08 NOTE — Telephone Encounter (Signed)
Called and spoke with pt and he stated that [facility] told him that they did not receive the sleep study or the OV notes.  Will forward to [facility] to have her refax these.  thanks

## 2021-09-07 ENCOUNTER — Other Ambulatory Visit: Payer: Self-pay | Admitting: Cardiology

## 2021-09-18 ENCOUNTER — Telehealth: Payer: Self-pay | Admitting: Cardiology

## 2021-09-18 MED ORDER — METOPROLOL TARTRATE 25 MG PO TABS: 12.5000 mg | ORAL_TABLET | Freq: Two times a day (BID) | ORAL | 1 refills | Status: DC

## 2021-09-18 NOTE — Telephone Encounter (Signed)
° ° °*  STAT* If patient is at the pharmacy, call can be transferred to refill team.   1. Which medications need to be refilled? (please list name of each medication and dose if known) metoprolol tartrate (LOPRESSOR) 25 MG tablet  2. Which pharmacy/location (including street and city if local pharmacy) is medication to be sent to?[id number] [place], [place] - [address]  3. Do they need a 30 day or 90 day supply? 90 days

## 2021-10-15 ENCOUNTER — Other Ambulatory Visit: Payer: Self-pay | Admitting: Cardiology

## 2021-11-27 ENCOUNTER — Encounter: Payer: Self-pay | Admitting: Cardiology

## 2021-11-27 ENCOUNTER — Ambulatory Visit (INDEPENDENT_AMBULATORY_CARE_PROVIDER_SITE_OTHER): Payer: Medicare Other | Admitting: Cardiology

## 2021-11-27 VITALS — BP 160/80 | HR 60 | Ht 66.0 in | Wt 230.4 lb

## 2021-11-27 DIAGNOSIS — I2510 Atherosclerotic heart disease of native coronary artery without angina pectoris: Secondary | ICD-10-CM

## 2021-11-27 DIAGNOSIS — I4891 Unspecified atrial fibrillation: Secondary | ICD-10-CM | POA: Diagnosis not present

## 2021-11-27 DIAGNOSIS — I10 Essential (primary) hypertension: Secondary | ICD-10-CM | POA: Diagnosis not present

## 2021-11-27 DIAGNOSIS — E782 Mixed hyperlipidemia: Secondary | ICD-10-CM | POA: Diagnosis not present

## 2021-11-27 MED ORDER — CARVEDILOL 3.125 MG PO TABS: 3.1250 mg | ORAL_TABLET | Freq: Two times a day (BID) | ORAL | 1 refills | Status: DC

## 2021-11-27 NOTE — Progress Notes (Signed)
Clinical Summary [name] [name] is a 71 y.o.male seen today for follow up of the following medical problems.         1. HTN     - bp's in clinic typically elevated, suspect some white coat HTN - he checks at home regularly. Had been 120s-140s until he had to stop chlorthalidone 37.5mg  daily for gout flare . Since that time bp's have been difficult to control, has had some reported symptoms - currently on dilt 360, eplerenone 50mg  (gynecomastia on aldactone), irbesartan 300. Headaches on hydralazine, stopped. Started on prazosin due to lack of other options, stopped due to severe dizziness. Dilt prevoiusly lowered from 360 to 180 for possible fatigue and headaches. Off prazosin due to dizziness   - issues with gout on chlorthalidone.  - reported upset stomach on epelerenone  - home bp's 110s-160s, often in 140s.    2. CAD   - prior 2 vessel CABG in 2004 at [facility]   - apparently had follow up cath in 2006 from [place] with patent grafts. Reports negative stress test in 2011 by Dr Sabra Heck in [place].  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 Aching discomfort, 5/10 in severity - reports ongoing abdominal pains at times as well - reports the symptoms overall are chronic and unchanged  - chronic chest pains unchanged.        3. OSA   - compliant with bipap - followed by Dr Elsworth Soho.      4. Hyperlipidemia   - noted some side effects on higher lipitor doses, has tolerated lower dose well   05/2020 TC 173 TG 131 HDL 45 LDL 102  - Jan 2023 TC 194 TG 241 HDL 42 LDL 104 - he is compliant with statin      5. Afib - s/p ablation. He had failed rate control as well  as multiple antiarrhythmics - no recent palpitations. No bleeding on eliquis   - recurrent afib 02/2018 - 05/2018 repeat ablation.   - occasioanl skipped beats but no prolonged palpitations  - no bleeding on eliquias  6. 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 ago   HLD (hyperlipidemia)    Hypertension    MI, old 2004   OA (osteoarthritis)    Obesity    Persistent atrial fibrillation (HCC)    failed medical therapy with sotalol, s/p PVI x 2   Sleep apnea    on CPAP   Spinal stenosis    Typical atrial flutter (HCC)      Allergies  Allergen Reactions   Vancomycin Anaphylaxis, Shortness Of Breath and Other (See Comments)    Was informed after heart  surgery Breathing problem patient unsure   Aldactone [Spironolactone]     GYNECOMASTIA    Chlorthalidone Other (See Comments)    GOUT   Nsaids Other (See Comments)    On blood thinner     Current Outpatient Medications  Medication Sig Dispense Refill   irbesartan (AVAPRO) 300 MG tablet TAKE 1 TABLET BY MOUTH EVERY DAY 90 tablet 1   acetaminophen (TYLENOL) 325 MG tablet Take 2 tablets (650 mg total) by mouth every 4 (four) hours as needed for mild pain ((score 1 to 3) or temp > 100.5).     apixaban (ELIQUIS) 5 MG TABS tablet Take 1 tablet (5 mg total) by mouth 2 (two) times daily. 42 tablet 0   atorvastatin (LIPITOR) 20 MG tablet TAKE 1 TABLET BY MOUTH EVERY DAY 90 tablet 3   Coenzyme Q10 (CO Q 10 PO) Take 400 mg by mouth daily.      diltiazem (CARDIZEM CD) 180 MG 24 hr capsule TAKE 1 CAPSULE BY MOUTH EVERY DAY 90 capsule 3   furosemide (LASIX)  20 MG tablet TAKE 2 TABLETS BY MOUTH EVERY DAY 180 tablet 2   hydrALAZINE (APRESOLINE) 50 MG tablet TAKE 1.5 TABLETS (75 MG TOTAL) BY MOUTH 3 (THREE) TIMES DAILY. 405 tablet 1   LORazepam (ATIVAN) 0.5 MG tablet Take 1 tablet (0.5 mg total) by mouth 4 (four) times daily. 120 tablet 0   metoprolol tartrate (LOPRESSOR) 25 MG tablet Take 0.5 tablets (12.5 mg total) by mouth 2 (two) times daily. 90 tablet 1   Multiple Vitamin (MULTIVITAMIN) tablet Take 1 tablet by mouth daily.     nitroGLYCERIN (NITROSTAT) 0.4 MG SL tablet Place 1 tablet (0.4 mg total) under the tongue every 5 (five) minutes x 3 doses as needed for chest pain. 25 tablet 1   omeprazole (PRILOSEC) 20 MG capsule TAKE 1 CAPSULE BY MOUTH EVERY OTHER DAY. 45 capsule 3   PRESCRIPTION MEDICATION Pt uses CPAP machine at bedtime     sertraline (ZOLOFT) 100 MG tablet Take 100 mg by mouth daily.     No current facility-administered medications for this visit.     Past Surgical History:  Procedure Laterality Date   ANTERIOR CERVICAL DECOMP/DISCECTOMY FUSION [name] 09/21/2019   Procedure: ANTERIOR CERVICAL DECOMPRESSION/DISCECTOMY FUSION CERVICAL THREE- CERVICAL FOUR, CERVICAL FOUR- CERVICAL FIVE, CERVICAL FIVE- CERVICAL SIX;  Surgeon: [name] Pies, MD;  Location: [facility];  Service: Neurosurgery;  Laterality: [name];  ANTERIOR CERVICAL DECOMPRESSION/DISCECTOMY FUSION CERVICAL THREE- CERVICAL FOUR, CERVICAL FOUR- CERVICAL FIVE, CERVICAL FIVE- CERVICAL SIX   ATRIAL FIBRILLATION ABLATION [name] 05/24/2018   Procedure: ATRIAL FIBRILLATION ABLATION;  Surgeon: Thompson Grayer, MD;  Location: [place] CV LAB;  Service: Cardiovascular;  Laterality: [name];   BACK SURGERY  2011   CARPAL TUNNEL RELEASE     bilateral   CHOLECYSTECTOMY     CORONARY ARTERY BYPASS GRAFT     2004   ELECTROPHYSIOLOGIC STUDY [name] 09/15/2016   Procedure: Atrial Fibrillation Ablation;  Surgeon: Thompson Grayer, MD;  Location: [place] CV LAB;  Service: Cardiovascular;  Laterality: [name];    fistula surgery     hemorrhoidectomy     LEFT HEART CATH AND CORS/GRAFTS ANGIOGRAPHY [name] 02/07/2018   Procedure: LEFT HEART CATH AND CORS/GRAFTS ANGIOGRAPHY;  Surgeon: Martinique, Peter M, MD;  Location: [place] CV LAB;  Service: Cardiovascular;  Laterality: [name];   LUMBAR LAMINECTOMY     right foot fracture     TEE WITHOUT CARDIOVERSION [name] 09/15/2016   Procedure: TRANSESOPHAGEAL ECHOCARDIOGRAM (TEE);  Surgeon:  Lelon Perla, MD;  Location: [place];  Service: Cardiovascular;  Laterality: [name];   TOTAL KNEE ARTHROPLASTY Left 07/07/2017   Procedure: LEFT TOTAL KNEE ARTHROPLASTY;  Surgeon: Latanya Maudlin, MD;  Location: [facility]S;  Service: Orthopedics;  Laterality: Left;   TRANSTHORACIC ECHOCARDIOGRAM  08/25/2010   EF 55-60%     Allergies  Allergen Reactions   Vancomycin Anaphylaxis, Shortness Of Breath and Other (See Comments)    Was informed after heart surgery Breathing problem patient unsure   Aldactone [Spironolactone]     GYNECOMASTIA    Chlorthalidone Other (See Comments)    GOUT   Nsaids Other (See Comments)    On blood thinner      Family History  Problem Relation Age of Onset   Breast cancer Mother    Hypertension Mother    Coronary artery disease Father    Stroke Father    Hypertension Father      Social History Mr. Livingood reports that he has never smoked. He has never used smokeless tobacco. Mr. Friar reports current alcohol use.   Review of Systems CONSTITUTIONAL: No weight loss, fever, chills, weakness or fatigue.  HEENT: Eyes: No visual loss, blurred vision, double vision or yellow sclerae.No hearing loss, sneezing, congestion, runny nose or sore throat.  SKIN: No rash or itching.  CARDIOVASCULAR: per hpi RESPIRATORY: No shortness of breath, cough or sputum.  GASTROINTESTINAL: No anorexia, nausea, vomiting or diarrhea. No abdominal pain or blood.  GENITOURINARY: No burning on urination, no polyuria NEUROLOGICAL: No headache, dizziness, syncope,  paralysis, ataxia, numbness or tingling in the extremities. No change in bowel or bladder control.  MUSCULOSKELETAL: No muscle, back pain, joint pain or stiffness.  LYMPHATICS: No enlarged nodes. No history of splenectomy.  PSYCHIATRIC: No history of depression or anxiety.  ENDOCRINOLOGIC: No reports of sweating, cold or heat intolerance. No polyuria or polydipsia.  Marland Kitchen   Physical Examination Today's Vitals   11/27/21 1103  BP: (!) 160/80  Pulse: 60  SpO2: 97%  Weight: 230 lb 6.4 oz (104.5 kg)  Height: 5\' 6"  (1.676 m)   Body mass index is 37.19 kg/m.  Gen: resting comfortably, no acute distress HEENT: no scleral icterus, pupils equal round and reactive, no palptable cervical adenopathy,  CV: RRR, no m/r/ g no jvd Resp: Clear to auscultation bilaterally GI: abdomen is soft, non-tender, non-distended, normal bowel sounds, no hepatosplenomegaly MSK: extremities are warm, no edema.  Skin: warm, no rash Neuro:  no focal deficits Psych: appropriate affect   Diagnostic Studies  03/2015 echo Study Conclusions  - Left ventricle: The cavity size was normal. Wall thickness was   increased in a pattern of mild LVH. Systolic function was normal.   The estimated ejection fraction was in the range of 50% to 55%.   Wall motion was normal; there were no regional wall motion   abnormalities. Doppler parameters are consistent with high   ventricular filling pressure. - Left atrium: The atrium was mildly dilated.     03/2015 Lexiscan MPI No T wave inversion was noted during stress. There was no ST segment deviation noted during stress. The study is normal. This is a low risk study. The left ventricular ejection fraction is normal (55-65%). Nuclear stress EF: 61%.   02/2018 cath Prox LAD lesion is 100% stenosed. Prox Cx to Mid Cx lesion is 60% stenosed. Post Atrio lesion is 100% stenosed. SVG graft was visualized by angiography and is large. The graft exhibits minimal luminal  irregularities. LIMA graft was visualized  by angiography and is normal in caliber. The graft exhibits no disease. The left ventricular systolic function is normal. LV end diastolic pressure is normal. The left ventricular ejection fraction is 55-65% by visual estimate.   1. 2 vessel occlusive CAD    - 100% proximal LAD    - 60% mid LCx    - 100% PLOM with left to right collaterals. 2. Patent LIMA to the LAD 3. Patent SVG to the diagonal 4. Normal LV function 5. Normal LVEDP     12/2018 heart monitor 48 hr holter monitor Min HR 46, Max HR 106, Avg HR 62 Rare ventricular ectopy, all in the form of isolated PVCs Occasional supraventricular ectopy, primarily as isolated PACs. One 3 beat run of atach. No significant arrhythmias     Assessment and Plan  1. HTN Above goal - multiple medication side effects as listed above - try chagning lopressor to coreg 3.125mg  bid for more bp effect   2. Afib - no significant symptoms, continue current meds - continue anticoag for stroke prevention  3. CAD -chronic atypical chest pains -we will continue to monitor at this time  4. Hyperlipidemia - above goal, has had side effects on higher dose statin - in general would limit too many med chagnes at once for patient given multiple side effects. May discuss zetiat at our next appt  F/u 6 months    Arnoldo Lenis, M.D.,

## 2021-11-27 NOTE — Patient Instructions (Addendum)
Medication Instructions:  Your physician has recommended you make the following change in your medication:  Stop metoprolol Start carvedilol 3.125 mg one tablet twice a day Continue other medications as directed  Labwork: none  Testing/Procedures: none  Follow-Up: Your physician recommends that you schedule a follow-up appointment in: 6 month  Any Other Special Instructions Will Be Listed Below (If Applicable). Please keep a log of your home BP readings and call the office in a week.  If you need a refill on your cardiac medications before your next appointment, please call your pharmacy.

## 2021-12-07 ENCOUNTER — Other Ambulatory Visit: Payer: Self-pay | Admitting: Cardiology

## 2021-12-11 ENCOUNTER — Telehealth: Payer: Self-pay | Admitting: Cardiology

## 2021-12-11 ENCOUNTER — Ambulatory Visit: Payer: Medicare Other | Admitting: *Deleted

## 2021-12-11 ENCOUNTER — Other Ambulatory Visit: Payer: Self-pay

## 2021-12-11 ENCOUNTER — Other Ambulatory Visit: Payer: Self-pay | Admitting: Cardiology

## 2021-12-11 VITALS — BP 160/80 | HR 60

## 2021-12-11 DIAGNOSIS — I10 Essential (primary) hypertension: Secondary | ICD-10-CM

## 2021-12-11 NOTE — Telephone Encounter (Signed)
Pt c/o BP issue:  ? ?1. What are your last 5 BP readings?  ?3/6 170/87  ?3/7 153/93 ?3/8 141/82 ?3/9 160/89 ?3/10 159/82 ? ? ?2. Are you having any other symptoms (ex. Dizziness, headache, blurred vision, passed out)? HEADACHE  REQUESTING TO HAVE HIS BP CHECK.  ? ?3. What is your BP issue? States that he wants his BP checked because he is concerned that he might have a stroke.  ?

## 2021-12-11 NOTE — Progress Notes (Signed)
Patient walked into office with c/o elevated BP -  ? ?Pt c/o BP issue:  ?  ?1. What are your last 5 BP readings?  ?3/6       170/87  ?3/7       153/93 ?3/8       141/82 ?3/9       160/89 ?3/10     159/82 ?  ?  ?2. Are you having any other symptoms (ex. Dizziness, headache, blurred vision, passed out)? HEADACHE  REQUESTING TO HAVE HIS BP CHECK.  ?  ?3. What is your BP issue? States that he wants his BP checked because he is concerned that he might have a stroke.  ? ? ?Discussed with patient - Metoprolol was recently stopped & started on Coreg 3.125mg  twice a day.  BP at last OV on 11/27/2021 was 160/80  60. ? ?Today BP was 160/80  60.   ? ?Patient is very anxious & concerned that he may have a stroke.  Does c/o having a headache but states that he had neck surgery 2 years ago & this has been bothering him as well.  Explained to him that may could cause a headache also & advised him to f/u on this as well.   ? ?

## 2021-12-11 NOTE — Telephone Encounter (Signed)
Patient brought to back for nurse to evaluate.  See nurse visit notes.  ?

## 2021-12-14 NOTE — Progress Notes (Signed)
Can we confirm he is taking hydralazine 75mg  tid, if so increase to 100mg  tid. I would reassure him on his bp's that they are elevated and can cause health concerns over longer periods of time but are not high enough to cause sudden health events. Unless systolic bp's in the [id number] to 200s generally is not something that would be considered a severe sudden risk. ? ?Carlyle Dolly MD ?

## 2021-12-15 MED ORDER — METOPROLOL TARTRATE 25 MG PO TABS: 25.0000 mg | ORAL_TABLET | Freq: Every day | ORAL | 3 refills | Status: DC

## 2021-12-15 MED ORDER — HYDRALAZINE HCL 100 MG PO TABS: 100.0000 mg | ORAL_TABLET | Freq: Three times a day (TID) | ORAL | 3 refills | Status: DC

## 2021-12-15 NOTE — Progress Notes (Signed)
Patient notified and verbalized understanding.  He will increase the Hydralazine to 100mg  three times per day.   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  Spoke with his pcp yesterday - was put on Prednisone dose pack for this & is feeling better off the Coreg.   ?Also mentions that he he is taking the Lopressor 25mg  daily & request refill on this.  Will send to pharmacy as requested.   ?

## 2021-12-15 NOTE — Progress Notes (Signed)
Thx for update. Rare but looks like coreg can flare gout in 1-3% of people. Can go back to the metoprolol and refill is fine. Prednisone will increase bp as well over the next few days, have him update [place] on bp's about 3 days after being off prednisone. Can we list coreg as causing gout in his chart ? ?Dominga Ferry MD ?

## 2021-12-15 NOTE — Progress Notes (Signed)
Patient notified and verbalized understanding. 

## 2022-03-09 ENCOUNTER — Other Ambulatory Visit: Payer: Self-pay | Admitting: Cardiology

## 2022-03-09 NOTE — Telephone Encounter (Signed)
Prescription refill request for Eliquis received. Indication: Atrial Fib Last office visit: 11/27/21  Dominga Ferry MD Scr: 0.99 on 10/29/21 Age: 71 Weight: 104.5kg  Based on above findings Eliquis 5mg  twice daily is the appropriate dose.  Refill approved.

## 2022-03-10 ENCOUNTER — Other Ambulatory Visit: Payer: Self-pay | Admitting: Cardiology

## 2022-04-13 DIAGNOSIS — M1711 Unilateral primary osteoarthritis, right knee: Secondary | ICD-10-CM | POA: Insufficient documentation

## 2022-04-13 DIAGNOSIS — R42 Dizziness and giddiness: Secondary | ICD-10-CM | POA: Insufficient documentation

## 2022-04-13 DIAGNOSIS — M65341 Trigger finger, right ring finger: Secondary | ICD-10-CM | POA: Insufficient documentation

## 2022-05-14 ENCOUNTER — Other Ambulatory Visit: Payer: Self-pay | Admitting: Cardiology

## 2022-05-21 ENCOUNTER — Encounter: Payer: Self-pay | Admitting: Cardiology

## 2022-05-21 ENCOUNTER — Ambulatory Visit (INDEPENDENT_AMBULATORY_CARE_PROVIDER_SITE_OTHER): Payer: Medicare Other | Admitting: Cardiology

## 2022-05-21 VITALS — BP 128/64 | HR 64 | Ht 66.0 in | Wt 226.6 lb

## 2022-05-21 DIAGNOSIS — I2510 Atherosclerotic heart disease of native coronary artery without angina pectoris: Secondary | ICD-10-CM | POA: Diagnosis not present

## 2022-05-21 DIAGNOSIS — I4891 Unspecified atrial fibrillation: Secondary | ICD-10-CM

## 2022-05-21 DIAGNOSIS — D6869 Other thrombophilia: Secondary | ICD-10-CM

## 2022-05-21 DIAGNOSIS — E782 Mixed hyperlipidemia: Secondary | ICD-10-CM

## 2022-05-21 DIAGNOSIS — I10 Essential (primary) hypertension: Secondary | ICD-10-CM | POA: Diagnosis not present

## 2022-05-21 NOTE — Progress Notes (Signed)
Clinical Summary Mr. Biby is a 71 y.o.male seen today for follow up of the following medical problems.       1. HTN     - bp's in clinic typically elevated, suspect some white coat HTN - he checks at home regularly. Had been 120s-140s until he had to stop chlorthalidone 37.5mg  daily for gout flare . Since that time bp's have been difficult to control, has had some reported symptoms - currently on dilt 360, eplerenone 50mg  (gynecomastia on aldactone), irbesartan 300. Headaches on hydralazine, stopped. Started on prazosin due to lack of other options, stopped due to severe dizziness. Dilt prevoiusly lowered from 360 to 180 for possible fatigue and headaches. Off prazosin due to dizziness   - issues with gout on chlorthalidone.  - reported upset stomach on epelerenone   - home bp's 110s-160s, often in 140s.   - last visti changed lopressor to coreg for more bp effect. Reports higher bp's on coreg, back to metoprolol - home bp's 109-132/71-74   2. CAD   - prior 2 vessel CABG in 2004 at [facility]   - apparently had follow up cath in 2006 from [place] with patent grafts. Reports negative stress test in 2011 by Dr 2012 in [place].  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 Aching discomfort, 5/10 in severity - chronic stable symptoms.      3. OSA   - compliant with bipap - followed by Dr 03/2018.      4. Hyperlipidemia   - noted some side effects on higher lipitor doses, has tolerated lower dose well   05/2020 TC 173 TG 131 HDL 45 LDL 102  - Jan 2023 TC 194 TG 241 HDL 42 LDL 104 - he is compliant with statin   04/2022 TC 192 TG 269 HDL 40 LDL 98 - he is not interested in zetia   5. Afib - s/p ablation. He  had failed rate control as well as multiple antiarrhythmics - no recent palpitations. No bleeding on eliquis   - recurrent afib 02/2018 - 05/2018 repeat ablation.   - no bleeding on eliquias - no palpitaitons - compliant with meds -    6. 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 ago   HLD (hyperlipidemia)    Hypertension    MI, old 2004   OA (osteoarthritis)    Obesity    Persistent atrial fibrillation (HCC)    failed medical therapy with sotalol, s/p PVI x 2   Sleep apnea    on CPAP   Spinal stenosis    Typical atrial flutter (HCC)      Allergies  Allergen Reactions   Vancomycin Anaphylaxis, Shortness Of Breath and Other (See Comments)  Was informed after heart surgery Breathing problem patient unsure   Aldactone [Spironolactone]     GYNECOMASTIA    Chlorthalidone Other (See Comments)    GOUT   Nsaids Other (See Comments)    On blood thinner   Coreg [Carvedilol] Other (See Comments)    Gout flare in ankle      Current Outpatient Medications  Medication Sig Dispense Refill   acetaminophen (TYLENOL) 325 MG tablet Take 2 tablets (650 mg total) by mouth every 4 (four) hours as needed for mild pain ((score 1 to 3) or temp > 100.5).     atorvastatin (LIPITOR) 20 MG tablet TAKE 1 TABLET BY MOUTH EVERY DAY 90 tablet 3   Coenzyme Q10 (CO Q 10 PO) Take 100 mg by mouth daily.     diltiazem (CARDIZEM CD) 180 MG 24 hr capsule TAKE 1 CAPSULE BY MOUTH EVERY DAY 90 capsule 3   ELIQUIS 5 MG TABS tablet TAKE 1 TABLET BY MOUTH TWICE A DAY 180 tablet 1   furosemide (LASIX) 20 MG tablet TAKE 2 TABLETS  BY MOUTH EVERY DAY 180 tablet 2   hydrALAZINE (APRESOLINE) 100 MG tablet Take 1 tablet (100 mg total) by mouth 3 (three) times daily. 270 tablet 3   irbesartan (AVAPRO) 300 MG tablet TAKE 1 TABLET BY MOUTH EVERY DAY 90 tablet 1   LORazepam (ATIVAN) 0.5 MG tablet Take 0.5 mg by mouth every 8 (eight) hours.     metoprolol tartrate (LOPRESSOR) 25 MG tablet Take 1 tablet (25 mg total) by mouth daily. 90 tablet 3   Multiple Vitamin (MULTIVITAMIN) tablet Take 1 tablet by mouth daily.     nitroGLYCERIN (NITROSTAT) 0.4 MG SL tablet Place 1 tablet (0.4 mg total) under the tongue every 5 (five) minutes x 3 doses as needed for chest pain. 25 tablet 1   omeprazole (PRILOSEC) 20 MG capsule TAKE 1 CAPSULE BY MOUTH EVERY OTHER DAY 45 capsule 3   PRESCRIPTION MEDICATION Pt uses CPAP machine at bedtime     sertraline (ZOLOFT) 100 MG tablet Take 100 mg by mouth daily.     No current facility-administered medications for this visit.     Past Surgical History:  Procedure Laterality Date   ANTERIOR CERVICAL DECOMP/DISCECTOMY FUSION [name] 09/21/2019   Procedure: ANTERIOR CERVICAL DECOMPRESSION/DISCECTOMY FUSION CERVICAL THREE- CERVICAL FOUR, CERVICAL FOUR- CERVICAL FIVE, CERVICAL FIVE- CERVICAL SIX;  Surgeon: Tressie Stalker, MD;  Location: [facility] OR;  Service: Neurosurgery;  Laterality: [name];  ANTERIOR CERVICAL DECOMPRESSION/DISCECTOMY FUSION CERVICAL THREE- CERVICAL FOUR, CERVICAL FOUR- CERVICAL FIVE, CERVICAL FIVE- CERVICAL SIX   ATRIAL FIBRILLATION ABLATION [name] 05/24/2018   Procedure: ATRIAL FIBRILLATION ABLATION;  Surgeon: Hillis Range, MD;  Location: [facility] INVASIVE CV LAB;  Service: Cardiovascular;  Laterality: [name];   BACK SURGERY  2011   CARPAL TUNNEL RELEASE     bilateral   CHOLECYSTECTOMY     CORONARY ARTERY BYPASS GRAFT     2004   ELECTROPHYSIOLOGIC STUDY [name] 09/15/2016   Procedure: Atrial Fibrillation Ablation;  Surgeon: Hillis Range, MD;  Location: [facility] INVASIVE CV LAB;  Service: Cardiovascular;  Laterality:  [name];   fistula surgery     hemorrhoidectomy     LEFT HEART CATH AND CORS/GRAFTS ANGIOGRAPHY [name] 02/07/2018   Procedure: LEFT HEART CATH AND CORS/GRAFTS ANGIOGRAPHY;  Surgeon: [name], Peter M, MD;  Location[facility] INVASIVE CV LAB;  Service: Cardiovascular;  Laterality: [name];   LUMBAR LAMINECTOMY     right foot fracture     TEE WITHOUT CARDIOVERSION [name] 09/15/2016  Procedure: TRANSESOPHAGEAL ECHOCARDIOGRAM (TEE);  Surgeon: Lewayne Bunting, MD;  Location: [facility] ENDOSCOPY;  Service: Cardiovascular;  Laterality: [name];   TOTAL KNEE ARTHROPLASTY Left 07/07/2017   Procedure: LEFT TOTAL KNEE ARTHROPLASTY;  Surgeon: Ranee Gosselin, MD;  Location: [facility] ORS;  Service: Orthopedics;  Laterality: Left;   TRANSTHORACIC ECHOCARDIOGRAM  08/25/2010   EF 55-60%     Allergies  Allergen Reactions   Vancomycin Anaphylaxis, Shortness Of Breath and Other (See Comments)    Was informed after heart surgery Breathing problem patient unsure   Aldactone [Spironolactone]     GYNECOMASTIA    Chlorthalidone Other (See Comments)    GOUT   Nsaids Other (See Comments)    On blood thinner   Coreg [Carvedilol] Other (See Comments)    Gout flare in ankle       Family History  Problem Relation Age of Onset   Breast cancer Mother    Hypertension Mother    Coronary artery disease Father    Stroke Father    Hypertension Father      Social History Mr. Bowdish reports that he has never smoked. He has never used smokeless tobacco. Mr. Calvert reports current alcohol use.   Review of Systems CONSTITUTIONAL: No weight loss, fever, chills, weakness or fatigue.  HEENT: Eyes: No visual loss, blurred vision, double vision or yellow sclerae.No hearing loss, sneezing, congestion, runny nose or sore throat.  SKIN: No rash or itching.  CARDIOVASCULAR: per hpi RESPIRATORY: per hpi GASTROINTESTINAL: No anorexia, nausea, vomiting or diarrhea. No abdominal pain or blood.  GENITOURINARY: No burning on urination, no  polyuria NEUROLOGICAL: No headache, dizziness, syncope, paralysis, ataxia, numbness or tingling in the extremities. No change in bowel or bladder control.  MUSCULOSKELETAL: No muscle, back pain, joint pain or stiffness.  LYMPHATICS: No enlarged nodes. No history of splenectomy.  PSYCHIATRIC: No history of depression or anxiety.  ENDOCRINOLOGIC: No reports of sweating, cold or heat intolerance. No polyuria or polydipsia.  Marland Kitchen   Physical Examination Today's Vitals   05/21/22 1124  BP: 128/64  Pulse: 64  SpO2: 96%  Weight: 226 lb 9.6 oz (102.8 kg)  Height: 5\' 6"  (1.676 m)   Body mass index is 36.57 kg/m.  Gen: resting comfortably, no acute distress HEENT: no scleral icterus, pupils equal round and reactive, no palptable cervical adenopathy,  CV: RRR, no m/r/g, no jvd Resp: Clear to auscultation bilaterally GI: abdomen is soft, non-tender, non-distended, normal bowel sounds, no hepatosplenomegaly MSK: extremities are warm, no edema.  Skin: warm, no rash Neuro:  no focal deficits Psych: appropriate affect   Diagnostic Studies  03/2015 echo Study Conclusions  - Left ventricle: The cavity size was normal. Wall thickness was   increased in a pattern of mild LVH. Systolic function was normal.   The estimated ejection fraction was in the range of 50% to 55%.   Wall motion was normal; there were no regional wall motion   abnormalities. Doppler parameters are consistent with high   ventricular filling pressure. - Left atrium: The atrium was mildly dilated.     03/2015 Lexiscan MPI No T wave inversion was noted during stress. There was no ST segment deviation noted during stress. The study is normal. This is a low risk study. The left ventricular ejection fraction is normal (55-65%). Nuclear stress EF: 61%.   02/2018 cath Prox LAD lesion is 100% stenosed. Prox Cx to Mid Cx lesion is 60% stenosed. Post Atrio lesion is 100% stenosed. SVG graft was visualized by angiography and  is large. The graft exhibits minimal luminal irregularities. LIMA graft was visualized by angiography and is normal in caliber. The graft exhibits no disease. The left ventricular systolic function is normal. LV end diastolic pressure is normal. The left ventricular ejection fraction is 55-65% by visual estimate.   1. 2 vessel occlusive CAD    - 100% proximal LAD    - 60% mid LCx    - 100% PLOM with left to right collaterals. 2. Patent LIMA to the LAD 3. Patent SVG to the diagonal 4. Normal LV function 5. Normal LVEDP     12/2018 heart monitor 48 hr holter monitor Min HR 46, Max HR 106, Avg HR 62 Rare ventricular ectopy, all in the form of isolated PVCs Occasional supraventricular ectopy, primarily as isolated PACs. One 3 beat run of atach. No significant arrhythmias   Assessment and Plan  1. HTN -at goal, contineu current meds   2. Afib/acquired thrombophilia - no symptoms, continue current meds including eliquis for stroke prevention   3. CAD -chronic atypical chest pains -continue to onitor   4. Hyperlipidemia - above goal, has had side effects on higher dose statin - he is not intersted in adding zetia. Would not titrate statin due to prior side effects      Antoine Poche, M.D.

## 2022-05-21 NOTE — Patient Instructions (Addendum)

## 2022-06-22 ENCOUNTER — Encounter: Payer: Self-pay | Admitting: Pulmonary Disease

## 2022-06-22 ENCOUNTER — Ambulatory Visit (INDEPENDENT_AMBULATORY_CARE_PROVIDER_SITE_OTHER): Payer: Medicare Other | Admitting: Pulmonary Disease

## 2022-06-22 DIAGNOSIS — I2510 Atherosclerotic heart disease of native coronary artery without angina pectoris: Secondary | ICD-10-CM

## 2022-06-22 DIAGNOSIS — J986 Disorders of diaphragm: Secondary | ICD-10-CM

## 2022-06-22 DIAGNOSIS — G4733 Obstructive sleep apnea (adult) (pediatric): Secondary | ICD-10-CM

## 2022-06-22 NOTE — Assessment & Plan Note (Signed)
BiPAP download was reviewed which shows excellent control of events on auto BiPAP settings with a max of 15/11, average pressure is 11/7 cm I asked him not to tinker with the pressure settings. He is very compliant and BiPAP recently helped improve his daytime somnolence and fatigue  Weight loss encouraged, compliance with goal of at least 4-6 hrs every night is the expectation. Advised against medications with sedative side effects Cautioned against driving when sleepy - understanding that sleepiness will vary on a day to day basis

## 2022-06-22 NOTE — Assessment & Plan Note (Signed)
Feel that he has some degree of diaphragmatic dysfunction and therefore BiPAP has been very helpful to him

## 2022-06-22 NOTE — Progress Notes (Signed)
   Subjective:    Patient ID: [name] [name], male    DOB: 1951-08-09, 71 y.o.   MRN: [id number]  HPI  71 yo obese, never smoker for follow-up of severe OSA and severe dyspnea following neck surgery    PMH -OSA, AFIB, CHF, HTN, myocardial infarction, GERD, hyperlipidemia, spinal stenosis.    10/2019 following cervical discectomy and decompression for spinal stenosis. developed diaphragmatic paralysis post neck surgery   Chief Complaint  Patient presents with   Follow-up    Bipap working well.     Auto BiPAP ordered oct 2022  He feels that BiPAP is much better than his previous CPAP experience. He uses this even in the daytime.  He reports dyspnea on exertion and when supine.  He used to sleep in a recliner but is now able to sleep in a bed with his head elevated   Significant tests/ events reviewed  Sniff test 09/2019 neg CT angiogram chest 1/8 low lung volumes bilateral, raised bilateral hemidiaphragm CT angiogram 1/21 left lower lobe atelectasis/consolidation   07/2021 BiPAP titration >> optimal 15/11, could not tolerate CPAP    PSG 09/2010 RDI 80/h Auto 12/2010:  Optimal cpap 14cm, great compliance   Review of Systems neg for any significant sore throat, dysphagia, itching, sneezing, nasal congestion or excess/ purulent secretions, fever, chills, sweats, unintended wt loss, pleuritic or exertional cp, hempoptysis, orthopnea pnd or change in chronic leg swelling. Also denies presyncope, palpitations, heartburn, abdominal pain, nausea, vomiting, diarrhea or change in bowel or urinary habits, dysuria,hematuria, rash, arthralgias, visual complaints, headache, numbness weakness or ataxia.     Objective:   Physical Exam   Gen. Pleasant, obese, in no distress ENT - no lesions, no post nasal drip Neck: No JVD, no thyromegaly, no carotid bruits Lungs: no use of accessory muscles, no dullness to percussion, decreased without rales or rhonchi  Cardiovascular: Rhythm regular,  heart sounds  normal, no murmurs or gallops, no peripheral edema Musculoskeletal: No deformities, no cyanosis or clubbing , no tremors        Assessment & Plan:

## 2022-06-22 NOTE — Patient Instructions (Signed)
  BiPAP is working well on auto settings

## 2022-08-21 ENCOUNTER — Other Ambulatory Visit: Payer: Self-pay | Admitting: Cardiology

## 2022-09-07 ENCOUNTER — Other Ambulatory Visit: Payer: Self-pay | Admitting: Cardiology

## 2022-09-07 NOTE — Telephone Encounter (Signed)
Prescription refill request for Eliquis received. Indication: AF Last office visit: 05/21/22  Dominga Ferry MD Scr: 1.05 on 04/15/22 Age: 71 Weight: 102.8kg  Based on above findings Eliquis 5mg  twice daily is the appropriate dose.  Refill approved.

## 2022-10-11 ENCOUNTER — Other Ambulatory Visit: Payer: Self-pay | Admitting: Cardiology

## 2022-10-15 DIAGNOSIS — M5412 Radiculopathy, cervical region: Secondary | ICD-10-CM | POA: Insufficient documentation

## 2022-11-05 ENCOUNTER — Telehealth: Payer: Self-pay | Admitting: Cardiology

## 2022-11-05 NOTE — Telephone Encounter (Signed)
Patient c/o Palpitations:  High priority if patient c/o lightheadedness, shortness of breath, or chest pain  How long have you had palpitations/irregular HR/ Afib? Are you having the symptoms now? Yes, few days  Are you currently experiencing lightheadedness, SOB or CP? chest tightness, few days  Do you have a history of afib (atrial fibrillation) or irregular heart rhythm? yes  Have you checked your BP or HR? (document readings if available): 68-70 usually, last few days 90-93, few days ago BP 134/78  Are you experiencing any other symptoms?   Patient says his heart usually ranges 68-70, but recently it will go up to 90-93 and then back down to 70's. He says he has also been having tightness in his chest and does have the tightness now. He says he has labs recently done at his PCP.

## 2022-11-05 NOTE — Telephone Encounter (Signed)
Report chest tightness that started 2-3 days ago. Reports active chest tightness rated 5/10. Says his chest tightness doesn't feel bad. Has not taken nitroglycerin due to side effects of headache. Denies dizziness or SOB. Reports HR has been fluctuating 68-93. Says he took an extra metoprolol 25 mg today since this is what he was told to do in the past for these symptoms. BP has not been taken in the last 2 days. Reports BP last taken on Tuesday this week and was 134/75. Says he does not want to go to the ED. Gave first available appointment to see [place] Calix 11/06/2022 @9 :00 am. Advised if symptoms get worse, to go to the ED for an evaluation. Verbalized understanding of plan.

## 2022-11-06 ENCOUNTER — Other Ambulatory Visit: Payer: Self-pay | Admitting: Nurse Practitioner

## 2022-11-06 ENCOUNTER — Ambulatory Visit: Payer: Medicare Other | Attending: Nurse Practitioner

## 2022-11-06 ENCOUNTER — Ambulatory Visit: Payer: Medicare Other | Attending: Nurse Practitioner | Admitting: Nurse Practitioner

## 2022-11-06 ENCOUNTER — Encounter: Payer: Self-pay | Admitting: Nurse Practitioner

## 2022-11-06 VITALS — BP 130/62 | HR 76 | Ht 66.0 in | Wt 230.2 lb

## 2022-11-06 DIAGNOSIS — R0602 Shortness of breath: Secondary | ICD-10-CM | POA: Diagnosis present

## 2022-11-06 DIAGNOSIS — Z8679 Personal history of other diseases of the circulatory system: Secondary | ICD-10-CM | POA: Diagnosis present

## 2022-11-06 DIAGNOSIS — E782 Mixed hyperlipidemia: Secondary | ICD-10-CM

## 2022-11-06 DIAGNOSIS — R079 Chest pain, unspecified: Secondary | ICD-10-CM

## 2022-11-06 DIAGNOSIS — I10 Essential (primary) hypertension: Secondary | ICD-10-CM | POA: Diagnosis present

## 2022-11-06 DIAGNOSIS — I25119 Atherosclerotic heart disease of native coronary artery with unspecified angina pectoris: Secondary | ICD-10-CM | POA: Diagnosis present

## 2022-11-06 DIAGNOSIS — I480 Paroxysmal atrial fibrillation: Secondary | ICD-10-CM

## 2022-11-06 DIAGNOSIS — I4891 Unspecified atrial fibrillation: Secondary | ICD-10-CM

## 2022-11-06 DIAGNOSIS — R002 Palpitations: Secondary | ICD-10-CM | POA: Diagnosis present

## 2022-11-06 DIAGNOSIS — G4733 Obstructive sleep apnea (adult) (pediatric): Secondary | ICD-10-CM | POA: Diagnosis present

## 2022-11-06 MED ORDER — METOPROLOL TARTRATE 25 MG PO TABS: 25.0000 mg | ORAL_TABLET | Freq: Two times a day (BID) | ORAL | 1 refills | Status: DC

## 2022-11-06 MED ORDER — ATORVASTATIN CALCIUM 40 MG PO TABS: 40.0000 mg | ORAL_TABLET | Freq: Every day | ORAL | 1 refills | Status: DC

## 2022-11-06 NOTE — Patient Instructions (Addendum)
Medication Instructions:  Your physician has recommended you make the following change in your medication:  Increase metoprolol tartrate to 25 mg twice daily Increase atorvastatin to 40 mg daily Continue other medications the same  Labwork: Troponin and Magnesium Levels today  Testing/Procedures: Your physician has recommended that you wear a Zio monitor.   This monitor is a medical device that records the heart's electrical activity. Doctors most often use these monitors to diagnose arrhythmias. Arrhythmias are problems with the speed or rhythm of the heartbeat. The monitor is a small device applied to your chest. You can wear one while you do your normal daily activities. While wearing this monitor if you have any symptoms to push the button and record what you felt. Once you have worn this monitor for the period of time provider prescribed (for 7 days), you will return the monitor device in the postage paid box. Once it is returned they will download the data collected and provide [place] with a report which the provider will then review and we will call you with those results. Important tips:  Avoid showering during the first 24 hours of wearing the monitor. Avoid excessive sweating to help maximize wear time. Do not submerge the device, no hot tubs, and no swimming pools. Keep any lotions or oils away from the patch. After 24 hours you may shower with the patch on. Take brief showers with your back facing the shower head.  Do not remove patch once it has been placed because that will interrupt data and decrease adhesive wear time. Push the button when you have any symptoms and write down what you were feeling. Once you have completed wearing your monitor, remove and place into box which has postage paid and place in your outgoing mailbox.  If for some reason you have misplaced your box then call our office and we can provide another box and/or mail it off for you.  Follow-Up: Your physician  recommends that you schedule a follow-up appointment in: 6 weeks  Any Other Special Instructions Will Be Listed Below (If Applicable).  If you need a refill on your cardiac medications before your next appointment, please call your pharmacy.

## 2022-11-06 NOTE — Progress Notes (Unsigned)
Cardiology Office Note:    Date:  11/06/2022  ID:  [name] [name], DOB [birth year], MRN [id number]  PCP:  Delorse Lek, FNP   [facility] Providers Cardiologist:  Dina Rich, MD Electrophysiologist:  Hillis Range, MD (Inactive)     Referring MD: Delorse Lek, FNP   CC: Chest tightness and palpitations  History of Present Illness:    [name] [name] is a 72 y.o. male with a hx of the following:   CAD, s/p CABG Hypertension Hyperlipidemia A-fib, s/p ablation in 2019 OSA, shortness of breath  Patient is a 72 year old male with past medical history as mentioned above.  He is status post two-vessel CABG in 2004 at [facility].  Had cardiac catheterization 2006 at [facility] with patent grafts.  NST in 2000 level is negative in [place].  Lexiscan in 2016 was negative for ischemia.  Most recently, cardiac cath in 2019 showed native disease with patent grafts.  He is status post ablation for A-fib in 2019 with repeat ablation 3 months later that year.  Last seen by Dr. Dina Rich on May 21, 2022.  Home blood pressures are well-controlled.  Noted chronic stable symptoms with chest discomfort, it was noted he had a long history of chronic atypical musculoskeletal chest pain.  Was following Dr. Vassie Loll for his OSA.  Denied any palpitations.  Was compliant with his medications.  No medication changes were made.  Was told to follow-up in 6 months.  Patient contacted our office this week noting chest tightness started this week, denied dizziness or shortness of breath.  Patient did not want to present to emergency department and requested to be seen in the office.  Today he presents for office evaluation.  He states for the last few weeks he has noticed chest tightness with heart rate ranging from 92-94.  Normally his heart rate is well-controlled and the 60s.  Has been a daily occurrence where he will have an episode where he of chest tightness across his chest,  nonradiating, lasting for about 2 to 3 hours, and is relieved with taking metoprolol tartrate.  He states to him, it does not feel like A-fib; however he did have chest tightness with his previous episodes of A-fib.  He states he wants to make sure he is not having a heart attack.  Denies any association with exertion.  He is unable to lay down flat for extended periods of time, and he is not sure how long he could tolerate laying down flat.  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  He states at the moment he does not want to undergo cardiac catheterization.  He is compliant with his medications, requesting Eliquis samples. Denies any palpitations, syncope, presyncope, dizziness, orthopnea, PND, swelling or significant weight changes, acute bleeding, or claudication.   Past Medical History:  Diagnosis Date   Anxiety    CHF (congestive heart failure) (HCC)    h/o due to fluid overload in recovery room   Coronary artery disease    2v CABG, 2004 ([facility])   COVID-19    DJD (degenerative joint disease)    Dyspnea    on exertion   GERD (gastroesophageal reflux disease)    History of kidney stones    3 stones yrs. ago   HLD (hyperlipidemia)    Hypertension    MI, old 2004   OA (osteoarthritis)    Obesity    Persistent atrial fibrillation (HCC)  failed medical therapy with sotalol, s/p PVI x 2   Sleep apnea    on CPAP   Spinal stenosis    Typical atrial flutter [facility])     Past Surgical History:  Procedure Laterality Date   ANTERIOR CERVICAL DECOMP/DISCECTOMY FUSION [name] 09/21/2019   Procedure: ANTERIOR CERVICAL DECOMPRESSION/DISCECTOMY FUSION CERVICAL THREE- CERVICAL FOUR, CERVICAL FOUR- CERVICAL FIVE, CERVICAL FIVE- CERVICAL SIX;  Surgeon: Newman Pies, MD;  Location: [place];  Service: Neurosurgery;  Laterality: [name];  ANTERIOR CERVICAL DECOMPRESSION/DISCECTOMY FUSION CERVICAL THREE- CERVICAL FOUR, CERVICAL FOUR-  CERVICAL FIVE, CERVICAL FIVE- CERVICAL SIX   ATRIAL FIBRILLATION ABLATION [name] 05/24/2018   Procedure: ATRIAL FIBRILLATION ABLATION;  Surgeon: Thompson Grayer, MD;  Location: [place] [facility];  Service: Cardiovascular;  Laterality: [name];   BACK SURGERY  2011   CARPAL TUNNEL RELEASE     bilateral   CHOLECYSTECTOMY     CORONARY ARTERY BYPASS GRAFT     2004   ELECTROPHYSIOLOGIC STUDY [name] 09/15/2016   Procedure: Atrial Fibrillation Ablation;  Surgeon: Thompson Grayer, MD;  Location: [place] [facility];  Service: Cardiovascular;  Laterality: [name];   fistula surgery     hemorrhoidectomy     LEFT HEART CATH AND CORS/GRAFTS ANGIOGRAPHY [name] 02/07/2018   Procedure: LEFT HEART CATH AND CORS/GRAFTS ANGIOGRAPHY;  Surgeon: Martinique, Peter M, MD;  Location: [place] [facility];  Service: Cardiovascular;  Laterality: [name];   LUMBAR LAMINECTOMY     right foot fracture     TEE WITHOUT CARDIOVERSION [name] 09/15/2016   Procedure: TRANSESOPHAGEAL ECHOCARDIOGRAM (TEE);  Surgeon: Lelon Perla, MD;  Location: [facility] ENDOSCOPY;  Service: Cardiovascular;  Laterality: [name];   TOTAL KNEE ARTHROPLASTY Left 07/07/2017   Procedure: LEFT TOTAL KNEE ARTHROPLASTY;  Surgeon: Latanya Maudlin, MD;  Location: [facility];  Service: Orthopedics;  Laterality: Left;   TRANSTHORACIC ECHOCARDIOGRAM  08/25/2010   EF 55-60%    Current Medications: Current Meds  Medication Sig   acetaminophen (TYLENOL) 325 MG tablet Take 2 tablets (650 mg total) by mouth every 4 (four) hours as needed for mild pain ((score 1 to 3) or temp > 100.5).   albuterol (VENTOLIN HFA) 108 (90 Base) MCG/ACT inhaler Inhale 2 puffs into the lungs every 6 (six) hours as needed.   atorvastatin (LIPITOR) 40 MG tablet Take 1 tablet (40 mg total) by mouth daily.   Coenzyme Q10 (CO Q 10 PO) Take 100 mg by mouth daily.   diltiazem (CARDIZEM CD) 180 MG 24 hr capsule TAKE 1 CAPSULE BY MOUTH EVERY DAY   ELIQUIS 5 MG TABS tablet TAKE 1 TABLET BY MOUTH TWICE A DAY   furosemide (LASIX) 20 MG  tablet TAKE 2 TABLETS BY MOUTH EVERY DAY   hydrALAZINE (APRESOLINE) 100 MG tablet TAKE 1 TABLET BY MOUTH 3 TIMES DAILY.   irbesartan (AVAPRO) 300 MG tablet TAKE 1 TABLET BY MOUTH EVERY DAY   LORazepam (ATIVAN) 0.5 MG tablet Take 0.5 mg by mouth every 8 (eight) hours.   meclizine (ANTIVERT) 25 MG tablet Take 25 mg by mouth 3 (three) times daily as needed.   Multiple Vitamin (MULTIVITAMIN) tablet Take 1 tablet by mouth daily.   nitroGLYCERIN (NITROSTAT) 0.4 MG SL tablet Place 1 tablet (0.4 mg total) under the tongue every 5 (five) minutes x 3 doses as needed for chest pain.   omeprazole (PRILOSEC) 20 MG capsule TAKE 1 CAPSULE BY MOUTH EVERY OTHER DAY   PRESCRIPTION MEDICATION Pt uses BiPAP machine at bedtime   sertraline (ZOLOFT) 100 MG tablet Take 100 mg  by mouth daily.    atorvastatin (LIPITOR) 20 MG tablet TAKE 1 TABLET BY MOUTH EVERY DAY   metoprolol tartrate (LOPRESSOR) 25 MG tablet TAKE 1 TABLET (25 MG TOTAL) BY MOUTH DAILY.     Allergies:   Vancomycin, Aldactone [spironolactone], Chlorthalidone, Nsaids, and Coreg [carvedilol]   Social History   Socioeconomic History   Marital status: Married    Spouse name: Not on file   Number of children: 0   Years of education: Not on file   Highest education level: Not on file  Occupational History   Occupation: electrician    Employer: [employer]  Tobacco Use   Smoking status: Never   Smokeless tobacco: Never  Vaping Use   Vaping Use: Never used  Substance and Sexual Activity   Alcohol use: Yes    Alcohol/week: 0.0 standard drinks of alcohol    Comment: ocassional   Drug use: No   Sexual activity: Yes    Partners: Female  Other Topics Concern   Not on file  Social History Narrative   Not on file   Social Determinants of Health   Financial Resource Strain: Not on file  Food Insecurity: Not on file  Transportation Needs: Not on file  Physical Activity: Sufficiently Active (02/05/2018)   Exercise Vital Sign    Days  of Exercise per Week: 7 days    Minutes of Exercise per Session: 30 min  Stress: No Stress Concern Present (02/05/2018)   [place]    Feeling of Stress : Only a little  Social Connections: Not on file     Family History: The patient's family history includes Breast cancer in his mother; Coronary artery disease in his father; Hypertension in his father and mother; Stroke in his father.  ROS:   Review of Systems  Constitutional: Negative.   HENT: Negative.    Eyes: Negative.   Respiratory:  Positive for shortness of breath. Negative for cough, hemoptysis, sputum production and wheezing.        Chronic, stable.  See HPI.  Cardiovascular:  Positive for chest pain, palpitations and orthopnea. Negative for claudication, leg swelling and PND.       See HPI.  Gastrointestinal: Negative.   Genitourinary: Negative.   Musculoskeletal: Negative.   Skin: Negative.   Neurological: Negative.   Endo/Heme/Allergies: Negative.   Psychiatric/Behavioral: Negative.      Please see the history of present illness.    All other systems reviewed and are negative.  EKGs/Labs/Other Studies Reviewed:    The following studies were reviewed today:   EKG:  EKG is ordered today.  The ekg ordered today demonstrates normal sinus rhythm, 73 bpm, incomplete RBBB, minimal voltage criteria for LVH, with ST/T wave abnormality, with repolarization abnormality, otherwise no acute ischemic changes.  48 hour holter monitor on 12/27/2018: 48 hr holter monitor Min HR 46, Max HR 106, Avg HR 62 Rare ventricular ectopy, all in the form of isolated PVCs Occasional supraventricular ectopy, primarily as isolated PACs. One 3 beat run of atach. No significant arrhythmias  Atrial fibrillation ablation on 05/24/2018: CONCLUSIONS: 1. Sinus rhythm upon presentation.   2. Intracardiac echo reveals a moderate sized left atrium with four separate pulmonary veins  without evidence of pulmonary vein stenosis.  There was no atrial appendage thrombus. 3.  The left superior and left inferior pulmonary veins were quiescent from the prior ablation.  There was minimal return of electrical activity within the right inferior pulmonary  veins at baseline.  The right superior pulmonary vein had exit conduction but not entrance conduction.  4. Successful electrical re-isolation of the right inferior and superior pulmonary veins 5. Left atrial flutter induced and ablated long the roof of the left atrium. 6. Additional ablation along the floor of the left atrium in order to create a standard "box" lesion along the posterior wall due to persistence of atrial arrhythmias. 7. Cavo-tricuspid isthmus ablation was performed with complete bidirectional isthmus block achieved.  8. No inducible arrhythmias following ablation both on and off of Isuprel 9. No early apparent complications.  CT Cardiac on 03/31/2018: IMPRESSION: 1.  No LAA thrombus   2.  Mild bi atrial enlargement   3. Normal PV anatomy dimensions as above near common ostium for right sided veins   4.  No pericardial effusion   5.  Esophagus courses closest to the RLPV   6.  Normal aortic root 3.3 cm   7. Appears to be patent LIMA to LAD and patent SVG to Diagonal branch  Echocardiogram on 03/17/2018: Study Conclusions   - Left ventricle: The cavity size was normal. There was mild    concentric hypertrophy. Systolic function was normal. The    estimated ejection fraction was in the range of 60% to 65%. Wall    motion was normal; there were no regional wall motion    abnormalities. Features are consistent with a pseudonormal left    ventricular filling pattern, with concomitant abnormal relaxation    and increased filling pressure (grade 2 diastolic dysfunction).    Doppler parameters are consistent with high ventricular filling    pressure.  - Aortic valve: Mildly to moderately calcified annulus.  Trileaflet.  Left heart cath on 02/07/2018: Prox LAD lesion is 100% stenosed. Prox Cx to Mid Cx lesion is 60% stenosed. Post Atrio lesion is 100% stenosed. SVG graft was visualized by angiography and is large. The graft exhibits minimal luminal irregularities. LIMA graft was visualized by angiography and is normal in caliber. The graft exhibits no disease. The left ventricular systolic function is normal. LV end diastolic pressure is normal. The left ventricular ejection fraction is 55-65% by visual estimate.   1. 2 vessel occlusive CAD    - 100% proximal LAD    - 60% mid LCx    - 100% PLOM with left to right collaterals. 2. Patent LIMA to the LAD 3. Patent SVG to the diagonal 4. Normal LV function 5. Normal LVEDP   Plan: continue medical management. May resume Eliquis this evening if no bleeding problems. The PLOM CTO is not suitable for PCI.   Lexiscan on 02/27/2016: No T wave inversion was noted during stress. There was no ST segment deviation noted during stress. The study is normal. This is a low risk study. The left ventricular ejection fraction is normal (55-65%). Nuclear stress EF: 61%.   Normal Lexiscan Myoview study. No evidence of ischemia.  Normal LV function with EF of 61%.   Recent Labs: No results found for requested labs within last 365 days.  Recent Lipid Panel    Component Value Date/Time   CHOL 173 05/17/2020 0819   TRIG 131 05/17/2020 0819   HDL 45 05/17/2020 0819   CHOLHDL 3.8 05/17/2020 0819   VLDL 26 05/17/2020 0819   LDLCALC 102 (H) 05/17/2020 0819     Risk Assessment/Calculations:    CHA2DS2-VASc Score = 2  This indicates a 2.2% annual risk of stroke. The patient's score is based upon: CHF History:  0 HTN History: 1 Diabetes History: 0 Stroke History: 0 Vascular Disease History: 0 Age Score: 1 Gender Score: 0     Physical Exam:    VS:  BP 130/62   Pulse 76   Ht 5\' 6"  (1.676 m)   Wt 230 lb 3.2 oz (104.4 kg)   SpO2 95%   BMI  37.16 kg/m     Wt Readings from Last 3 Encounters:  11/06/22 230 lb 3.2 oz (104.4 kg)  06/22/22 230 lb 9.6 oz (104.6 kg)  05/21/22 226 lb 9.6 oz (102.8 kg)     GEN: Obese 72 y.o. male in no acute distress HEENT: Normal NECK: No JVD; No carotid bruits CARDIAC: S1/S2, RRR, no murmurs, rubs, gallops; 2+ pulses RESPIRATORY:  Clear to auscultation without rales, wheezing or rhonchi  MUSCULOSKELETAL:  No edema; No deformity  SKIN: Warm and dry NEUROLOGIC:  Alert and oriented x 3 PSYCHIATRIC:  Normal affect   ASSESSMENT:    1. Chest pain of uncertain etiology   2. Coronary artery disease involving native heart with angina pectoris, unspecified vessel or lesion type (HCC)   3. Palpitations   4. History of atrial fibrillation   5. Essential hypertension, benign   6. Mixed hyperlipidemia   7. Obstructive sleep apnea   8. Shortness of breath    PLAN:    In order of problems listed above:  Chest pain of uncertain etiology, CAD Has been a daily occurrence of chest tightness, nonradiating, lasting for about 2 to 3 hours, relieved with Lopressor, not associated with exertion.  Previously had chest tightness when he had episodes of A-fib.  Heart catheterization in 2019 revealed two-vessel) with patent LIMA to the LAD, patent SVG to the diagonal.  Medical management was recommended. Unable to do ischemic evaluation at this time as he is not sure how long he can lay down for (see below).  EKG today does not reveal any acute ischemic changes.  Case D/W Dr. Carlyle Dolly who agreed with lab work and arranging cardiac monitor. Will arrange the following labs: Troponin I and Magnesium.  Will increase metoprolol to tartrate to 25 mg twice daily instead of 25 mg daily.  Continue irbesartan and atorvastatin. Heart healthy diet and regular cardiovascular exercise encouraged.  ED precautions discussed.  Palpitations; hx of A-fib, status post ablation Has noticed heart rate which is normally in the 60s,  recently up to the 90s.  As stated above, has noticed chest tightness that is when he had episodes of A-fib.  EKG shows normal sinus rhythm today.  Recent labs stable. Will arrange a ZIO monitor for 7 days to evaluate for any arrhythmias.  Will arrange the following labs as mentioned previously.  Increasing Lopressor as mentioned above.  Continue diltiazem and Eliquis.  Will provide Eliquis samples per his request. He is on appropriate dosage.  Denies any bleeding issues. Heart healthy diet and regular cardiovascular exercise encouraged.   HTN Blood pressure stable.  BP well-controlled at home.  Increasing Lopressor as mentioned above.  Continue rest of current medication regimen. Heart healthy diet and regular cardiovascular exercise encouraged.  HLD Recent lipid panel revealed LDL at 107.  Previously planned for atorvastatin to be increased to 40 mg daily, however patient states was not able to tolerate Lipitor at higher dose.  Recommended starting Zetia for further lowering of LDL, however he politely declines at this time.  He will think about this and we will revisit this at next office visit.  Will change his  order back to atorvastatin 20 mg daily. Heart healthy diet and regular cardiovascular exercise encouraged.   OSA, shortness of breath Continue to follow-up with Dr. Vassie Loll for OSA. 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 Unable to lay flat; therefore, would not be able to tolerate ischemic evaluation at this time. ED precautions discussed.   6. Disposition: Follow-up with me or APP in 6 weeks or sooner if anything changes.    Medication Adjustments/Labs and Tests Ordered: Current medicines are reviewed at length with the patient today.  Concerns regarding medicines are outlined above.  Orders Placed This Encounter  Procedures   Troponin T   Magnesium   EKG 12-Lead   Meds ordered this encounter  Medications   metoprolol tartrate  (LOPRESSOR) 25 MG tablet    Sig: Take 1 tablet (25 mg total) by mouth 2 (two) times daily.    Dispense:  180 tablet    Refill:  1    11/06/2022 dose increase   atorvastatin (LIPITOR) 40 MG tablet    Sig: Take 0.5 tablets (20 mg total) by mouth daily.    Dispense:  90 tablet    Refill:  1        Order Specific Question:   Supervising Provider    Answer:   Antoine Poche [[id number]]    Patient Instructions  Medication Instructions:  Your physician has recommended you make the following change in your medication:  Increase metoprolol tartrate to 25 mg twice daily Continue other medications the same  Labwork: Troponin and Magnesium Levels today  Testing/Procedures: Your physician has recommended that you wear a Zio monitor.   This monitor is a medical device that records the heart's electrical activity. Doctors most often use these monitors to diagnose arrhythmias. Arrhythmias are problems with the speed or rhythm of the heartbeat. The monitor is a small device applied to your chest. You can wear one while you do your normal daily activities. While wearing this monitor if you have any symptoms to push the button and record what you felt. Once you have worn this monitor for the period of time provider prescribed (for 7 days), you will return the monitor device in the postage paid box. Once it is returned they will download the data collected and provide [place] with a report which the provider will then review and we will call you with those results. Important tips:  Avoid showering during the first 24 hours of wearing the monitor. Avoid excessive sweating to help maximize wear time. Do not submerge the device, no hot tubs, and no swimming pools. Keep any lotions or oils away from the patch. After 24 hours you may shower with the patch on. Take brief showers with your back facing the shower head.  Do not remove patch once it has been placed because that will interrupt data and decrease  adhesive wear time. Push the button when you have any symptoms and write down what you were feeling. Once you have completed wearing your monitor, remove and place into box which has postage paid and place in your outgoing mailbox.  If for some reason you have misplaced your box then call our office and we can provide another box and/or mail it off for you.  Follow-Up: Your physician recommends that you schedule a follow-up appointment in: 6 weeks  Any Other Special Instructions Will Be Listed Below (If Applicable).  If you need a refill on your cardiac medications before your next appointment,  please call your pharmacy.   Signed, Finis Bud, NP  11/07/2022 8:18 PM    [place]

## 2022-11-07 MED ORDER — METOPROLOL TARTRATE 25 MG PO TABS: 25.0000 mg | ORAL_TABLET | Freq: Two times a day (BID) | ORAL | 1 refills | Status: DC

## 2022-11-07 MED ORDER — ATORVASTATIN CALCIUM 40 MG PO TABS: 20.0000 mg | ORAL_TABLET | Freq: Every day | ORAL | 1 refills | Status: DC

## 2022-11-18 ENCOUNTER — Ambulatory Visit (HOSPITAL_COMMUNITY): Payer: Medicare Other | Attending: Family Medicine | Admitting: Physical Therapy

## 2022-11-18 ENCOUNTER — Encounter (HOSPITAL_COMMUNITY): Payer: Self-pay | Admitting: Physical Therapy

## 2022-11-18 DIAGNOSIS — M5412 Radiculopathy, cervical region: Secondary | ICD-10-CM | POA: Insufficient documentation

## 2022-11-18 DIAGNOSIS — M6281 Muscle weakness (generalized): Secondary | ICD-10-CM | POA: Insufficient documentation

## 2022-11-18 NOTE — Therapy (Signed)
OUTPATIENT PHYSICAL THERAPY CERVICAL EVALUATION   Patient Name: ANDRIK MORETZ MRN: [id number] DOB:02-Feb-1951, 72 y.o., male Today's Date: 11/18/2022  END OF SESSION:  PT End of Session - 11/18/22 1102     Visit Number 1    Number of Visits 12    Date for PT Re-Evaluation 12/30/22    Authorization Type Medicare A and B    Progress Note Due on Visit 10    PT Start Time 1030    PT Stop Time 1110    PT Time Calculation (min) 40 min    Activity Tolerance Patient tolerated treatment well    Behavior During Therapy WFL for tasks assessed/performed             Past Medical History:  Diagnosis Date   Anxiety    CHF (congestive heart failure) ([place])    h/o due to fluid overload in recovery room   Coronary artery disease    2v CABG, 2004 ([facility])   COVID-19    DJD (degenerative joint disease)    Dyspnea    on exertion   GERD (gastroesophageal reflux disease)    History of kidney stones    3 stones yrs. ago   HLD (hyperlipidemia)    Hypertension    MI, old 2004   OA (osteoarthritis)    Obesity    Persistent atrial fibrillation (HCC)    failed medical therapy with sotalol, s/p PVI x 2   Sleep apnea    on CPAP   Spinal stenosis    Typical atrial flutter [facility])    Past Surgical History:  Procedure Laterality Date   ANTERIOR CERVICAL DECOMP/DISCECTOMY FUSION [name] 09/21/2019   Procedure: ANTERIOR CERVICAL DECOMPRESSION/DISCECTOMY FUSION CERVICAL THREE- CERVICAL FOUR, CERVICAL FOUR- CERVICAL FIVE, CERVICAL FIVE- CERVICAL SIX;  Surgeon: Newman Pies, MD;  Location: [place];  Service: Neurosurgery;  Laterality: [name];  ANTERIOR CERVICAL DECOMPRESSION/DISCECTOMY FUSION CERVICAL THREE- CERVICAL FOUR, CERVICAL FOUR- CERVICAL FIVE, CERVICAL FIVE- CERVICAL SIX   ATRIAL FIBRILLATION ABLATION [name] 05/24/2018   Procedure: ATRIAL FIBRILLATION ABLATION;  Surgeon: Thompson Grayer, MD;  Location: [place] [facility];  Service: Cardiovascular;  Laterality: [name];   BACK SURGERY  2011   CARPAL TUNNEL  RELEASE     bilateral   CHOLECYSTECTOMY     CORONARY ARTERY BYPASS GRAFT     2004   ELECTROPHYSIOLOGIC STUDY [name] 09/15/2016   Procedure: Atrial Fibrillation Ablation;  Surgeon: Thompson Grayer, MD;  Location: [place] [facility];  Service: Cardiovascular;  Laterality: [name];   fistula surgery     hemorrhoidectomy     LEFT HEART CATH AND CORS/GRAFTS ANGIOGRAPHY [name] 02/07/2018   Procedure: LEFT HEART CATH AND CORS/GRAFTS ANGIOGRAPHY;  Surgeon: Martinique, Peter M, MD;  Location: [place] [facility];  Service: Cardiovascular;  Laterality: [name];   LUMBAR LAMINECTOMY     right foot fracture     TEE WITHOUT CARDIOVERSION [name] 09/15/2016   Procedure: TRANSESOPHAGEAL ECHOCARDIOGRAM (TEE);  Surgeon: Lelon Perla, MD;  Location: [facility];  Service: Cardiovascular;  Laterality: [name];   TOTAL KNEE ARTHROPLASTY Left 07/07/2017   Procedure: LEFT TOTAL KNEE ARTHROPLASTY;  Surgeon: Latanya Maudlin, MD;  Location: [facility];  Service: Orthopedics;  Laterality: Left;   TRANSTHORACIC ECHOCARDIOGRAM  08/25/2010   EF 55-60%   Patient Active Problem List   Diagnosis Date Noted   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   Cervical myelopathy ([place]) 10/03/2019   Myelopathy concurrent with and due to spinal stenosis of cervical region [facility]) 09/21/2019   Paroxysmal atrial fibrillation ([place])  05/24/2018   Unstable angina ([place]) 02/04/2018   Typical atrial flutter (HCC)    Spinal stenosis    QT prolongation    Palpitations    PAF (paroxysmal atrial fibrillation) (HCC)    OA (osteoarthritis)    Hypertension    HLD (hyperlipidemia)    Edema    DJD (degenerative joint disease)    Coronary artery disease    Chronic chest pain    Hx of total knee replacement, 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   A-fib ([place]) 09/15/2016   Chest pain 09/09/2016   Obesity 08/22/2016   Anxiety state 08/22/2016   Encounter for monitoring sotalol therapy 08/20/2016   Morbidly obese ([place]) 07/12/2016   Medical non-compliance 07/12/2016   Elevated TSH 07/09/2016   BPH  without urinary obstruction 07/09/2016   Need for hepatitis C screening test 07/09/2016   Idiopathic gout 07/09/2016   Hyperglycemia 08/01/2015   Atrial fibrillation with RVR ([place]) 03/28/2015   Routine general medical examination at a health care facility 08/11/2013   Chronic sinus bradycardia    Obstructive sleep apnea 09/12/2010   MYOCARDIAL INFARCTION 09/11/2010   Spinal stenosis, unspecified region other than cervical 09/11/2010   Hyperlipidemia with target LDL less than 70 09/08/2010   Depression 09/08/2010   Essential hypertension 09/08/2010   Coronary atherosclerosis 09/08/2010   Congestive heart failure ([place]) 09/08/2010   GERD 09/08/2010   MI, old 10/05/2002    PCP: Dellia Nims DNP   REFERRING PROVIDER: Patricia Nettle, NP  REFERRING DIAG: M79.601 (ICD-10-CM) - Pain in right arm  THERAPY DIAG:  Radiculopathy, cervical region - Plan: PT plan of care cert/re-cert  Muscle weakness (generalized) - Plan: PT plan of care cert/re-cert  Rationale for Evaluation and Treatment: Rehabilitation  ONSET DATE: 09/20/2020  SUBJECTIVE:                                                                                                                                                                                                         SUBJECTIVE STATEMENT: Patient presents to therapy with complaint of RT arm weakness and pain. It all started after a neck surgery about 3 years ago. His LT arm went numb but he waited to see MD about it. They called it nerve damage and said there wasn't much to do. More recently, about 3 months ago, his RT arm started bothering him as far as pain in elbow into axilla and also weakness. He feels what he needs is to stretch his neck out to give space to the nerves. He has not had previous therapy for these issues.  PERTINENT HISTORY:  Cervical fusion 2021, 3 x open heart surgery, A-fib, Hx of MI, LT TKA, Portion of diaphragm paralyzed (patient report)    PAIN:  Are you having pain? Yes: NPRS scale: 3/10 Pain location: Rt elbow and arm  Pain description: aching, numb, tingling  Aggravating factors: turning head, bending/ lifting, shaving  Relieving factors: beer  PRECAUTIONS: Other: 20lb lifting restriction   WEIGHT BEARING RESTRICTIONS: No  FALLS:  Has patient fallen in last 6 months? No  OCCUPATION: Retired Clinical biochemist   PLOF: Independent  PATIENT GOALS: Get stretched out   NEXT MD VISIT: 02/19/23  OBJECTIVE:   DIAGNOSTIC FINDINGS:  NA  PATIENT SURVEYS:    COGNITION: Overall cognitive status: Within functional limits for tasks assessed  SENSATION: Decreased sensation to light touch about RUE   POSTURE: rounded shoulders and forward head  CERVICAL ROM:   Active ROM A/PROM (deg) eval  Flexion 42  Extension 29  Right lateral flexion   Left lateral flexion   Right rotation 45  Left rotation 47   (Blank rows = not tested)  UPPER EXTREMITY ROM:  Active ROM Right eval Left eval  Shoulder flexion 90 85  Shoulder extension    Shoulder abduction    Shoulder adduction    Shoulder extension    Shoulder internal rotation    Shoulder external rotation    Elbow flexion    Elbow extension    Wrist flexion    Wrist extension    Wrist ulnar deviation    Wrist radial deviation    Wrist pronation    Wrist supination     (Blank rows = not tested)  UPPER EXTREMITY MMT:  MMT Right eval Left eval  Shoulder flexion 4- 4-  Shoulder extension    Shoulder abduction    Shoulder adduction    Shoulder extension 4- 4-  Shoulder internal rotation 4 4  Shoulder external rotation    Middle trapezius    Lower trapezius    Elbow flexion    Elbow extension    Wrist flexion    Wrist extension    Wrist ulnar deviation    Wrist radial deviation    Wrist pronation    Wrist supination    Grip strength     (Blank rows = not tested)   TODAY'S TREATMENT:                                                                                                                               DATE:  11/18/22 Eval    PATIENT EDUCATION:  Education details: On Eval findings, POC and HEP  Person educated: Patient and Spouse Education method: Explanation Education comprehension: verbalized understanding  HOME EXERCISE PROGRAM: Access Code: [email or web address] URL: [email or web address] Date: 11/18/2022 Prepared by: Josue Hector  Exercises - Correct Seated Posture  - 2-3 x daily - 7 x weekly - 1 sets - 10 reps - 5 second hold - Seated Scapular Retraction  -  2-3 x daily - 7 x weekly - 1-2 sets - 10 reps - 5 second hold - Seated Cervical Retraction  - 2-3 x daily - 7 x weekly - 1-2 sets - 10 reps - 5 second hold  ASSESSMENT:  CLINICAL IMPRESSION: Patient is a 72 y.o. male who presents to physical therapy with complaint of neck pain/ Rt arm weakness. Patient demonstrates decreased strength, ROM restriction, reduced flexibility, increased tenderness to palpation and postural abnormalities which are likely contributing to symptoms of pain and are negatively impacting patient ability to perform ADLs. Patient will benefit from skilled physical therapy services to address these deficits to reduce pain and improve level of function with ADLs   OBJECTIVE IMPAIRMENTS: decreased activity tolerance, decreased mobility, decreased ROM, decreased strength, hypomobility, impaired flexibility, impaired UE functional use, improper body mechanics, postural dysfunction, and pain.   ACTIVITY LIMITATIONS: carrying, lifting, bending, sleeping, bathing, toileting, dressing, reach over head, hygiene/grooming, and caring for others  PARTICIPATION LIMITATIONS: meal prep, cleaning, laundry, driving, shopping, community activity, and yard work  PERSONAL FACTORS: Past/current experiences, Time since onset of injury/illness/exacerbation, and 3+ comorbidities: See above  are also affecting patient's functional outcome.   REHAB POTENTIAL:  Fair See above   CLINICAL DECISION MAKING: Stable/uncomplicated  EVALUATION COMPLEXITY: Low   GOALS: SHORT TERM GOALS: Target date: 12/30/2022  Patient will be independent with initial HEP and self-management strategies to improve functional outcomes Baseline:  Goal status: INITIAL   LONG TERM GOALS: Target date: 12/30/2022  Patient will be independent with advanced HEP and self-management strategies to improve functional outcomes Baseline:  Goal status: INITIAL  2.  Patient will improve tested UE MMT to at least 4/5 throughout for improved ability to perform [facility] ADLs. Baseline: See MMT  Goal status: INITIAL  3.  Patient will demo improved bilat cervical rotation by 10 degrees in order to improve ability to scan environment for safety and while driving. Baseline: See AROM Goal status: INITIAL  4. Patient will report a decrease in neck pain to 0/10 at rest for improved quality of life and ability to perform UE ADLs  Baseline: 3/10 Goal status: INITIAL   5. Patient will improve bilateral shoulder flexion to at least 125 degrees for improved ability to perform [place] ADLs.  Baseline: See AROM  Goal status: INITIAL   PLAN:  PT FREQUENCY: 1-2x/week  PT DURATION: 6 weeks  PLANNED INTERVENTIONS: Therapeutic exercises, Therapeutic activity, Neuromuscular re-education, Balance training, Gait training, Patient/Family education, Joint manipulation, Joint mobilization, Stair training, Aquatic Therapy, Dry Needling, Electrical stimulation, Spinal manipulation, Spinal mobilization, Cryotherapy, Moist heat, scar mobilization, Taping, Traction, Ultrasound, Biofeedback, Ionotophoresis 19m/ml Dexamethasone, and Manual therapy.   PLAN FOR NEXT SESSION: Progress postural strengthening as tolerated. Cervical and UE ROM as able. Try mechanical traction, start low (8-10lb)   11:15 AM, 11/18/22 CJosue HectorPT DPT  Physical Therapist with [facility] ([phone]

## 2022-11-20 ENCOUNTER — Encounter: Payer: Self-pay | Admitting: *Deleted

## 2022-11-20 ENCOUNTER — Telehealth: Payer: Self-pay | Admitting: Cardiology

## 2022-11-20 NOTE — Telephone Encounter (Signed)
Replied via mychart.

## 2022-11-20 NOTE — Telephone Encounter (Signed)
Patient is calling to see if the results from his heart monitor are in. Please advise.

## 2022-11-26 ENCOUNTER — Ambulatory Visit (HOSPITAL_COMMUNITY): Payer: Medicare Other

## 2022-11-26 ENCOUNTER — Ambulatory Visit: Payer: Medicare Other | Admitting: Cardiology

## 2022-11-26 DIAGNOSIS — M6281 Muscle weakness (generalized): Secondary | ICD-10-CM

## 2022-11-26 DIAGNOSIS — M5412 Radiculopathy, cervical region: Secondary | ICD-10-CM | POA: Diagnosis not present

## 2022-11-26 NOTE — Therapy (Signed)
OUTPATIENT PHYSICAL THERAPY CERVICAL TREATMENT   Patient Name: [name] [name] MRN: [id number] DOB:25-Apr-1951, 71 y.o., male Today's Date: 11/26/2022  END OF SESSION:  PT End of Session - 11/26/22 1445     Visit Number 2    Number of Visits 12    Date for PT Re-Evaluation 12/30/22    Authorization Type Medicare A and B    Progress Note Due on Visit 10    PT Start Time [id number]    PT Stop Time 1440    PT Time Calculation (min) 55 min    Activity Tolerance Patient limited by pain            Past Medical History:  Diagnosis Date   Anxiety    CHF (congestive heart failure) ([place])    h/o due to fluid overload in recovery room   Coronary artery disease    2v CABG, 2004 ([facility])   COVID-19    DJD (degenerative joint disease)    Dyspnea    on exertion   GERD (gastroesophageal reflux disease)    History of kidney stones    3 stones yrs. ago   HLD (hyperlipidemia)    Hypertension    MI, old 2004   OA (osteoarthritis)    Obesity    Persistent atrial fibrillation (H[facility])    failed medical therapy with sotalol, s/p PVI x 2   Sleep apnea    on CPAP   Spinal stenosis    Typical atrial flutter [facility])    Past Surgical History:  Procedure Laterality Date   ANTERIOR CERVICAL DECOMP/DISCECTOMY FUSION [name] 09/21/2019   Procedure: ANTERIOR CERVICAL DECOMPRESSION/DISCECTOMY FUSION CERVICAL THREE- CERVICAL FOUR, CERVICAL FOUR- CERVICAL FIVE, CERVICAL FIVE- CERVICAL SIX;  Surgeon: Newman Pies, MD;  Location: [facility];  Service: Neurosurgery;  Laterality: [name];  ANTERIOR CERVICAL DECOMPRESSION/DISCECTOMY FUSION CERVICAL THREE- CERVICAL FOUR, CERVICAL FOUR- CERVICAL FIVE, CERVICAL FIVE- CERVICAL SIX   ATRIAL FIBRILLATION ABLATION [name] 05/24/2018   Procedure: ATRIAL FIBRILLATION ABLATION;  Surgeon: Thompson Grayer, MD;  Location: [place] [facility];  Service: Cardiovascular;  Laterality: [name];   BACK SURGERY  2011   CARPAL TUNNEL RELEASE     bilateral   CHOLECYSTECTOMY     CORONARY ARTERY BYPASS  GRAFT     2004   ELECTROPHYSIOLOGIC STUDY [name] 09/15/2016   Procedure: Atrial Fibrillation Ablation;  Surgeon: Thompson Grayer, MD;  Location: [place] [facility];  Service: Cardiovascular;  Laterality: [name];   fistula surgery     hemorrhoidectomy     LEFT HEART CATH AND CORS/GRAFTS ANGIOGRAPHY [name] 02/07/2018   Procedure: LEFT HEART CATH AND CORS/GRAFTS ANGIOGRAPHY;  Surgeon: Martinique, Peter M, MD;  Location: [place] [facility];  Service: Cardiovascular;  Laterality: [name];   LUMBAR LAMINECTOMY     right foot fracture     TEE WITHOUT CARDIOVERSION [name] 09/15/2016   Procedure: TRANSESOPHAGEAL ECHOCARDIOGRAM (TEE);  Surgeon: Lelon Perla, MD;  [facility] END[facility];  Service: Cardiovascular;  Laterality: [name];   TOTAL KNEE ARTHROPLASTY Left 07/07/2017   Procedure: LEFT TOTAL KNEE ARTHROPLASTY;  Surgeon: Latanya Maudlin, MD;  Location: [facility];  Service: Orthopedics;  Laterality: Left;   TRANSTHORACIC ECHOCARDIOGRAM  08/25/2010   EF 55-60%   Patient Active Problem List   Diagnosis Date Noted   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   Cervical myelopathy ([place]) 10/03/2019   Myelopathy concurrent with and due to spinal stenosis of cervical region [facility]) 09/21/2019   Paroxysmal atrial fibrillation ([place]) 05/24/2018   Unstable angina ([place]) 02/04/2018   Typical atrial  flutter ([place])    Spinal stenosis    QT prolongation    Palpitations    PAF (paroxysmal atrial fibrillation) (HCC)    OA (osteoarthritis)    Hypertension    HLD (hyperlipidemia)    Edema    DJD (degenerative joint disease)    Coronary artery disease    Chronic chest pain    Hx of total knee replacement, 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   A-fib ([place]) 09/15/2016   Chest pain 09/09/2016   Obesity 08/22/2016   Anxiety state 08/22/2016   Encounter for monitoring sotalol therapy 08/20/2016   Morbidly obese ([place]) 07/12/2016   Medical non-compliance 07/12/2016   Elevated TSH 07/09/2016   BPH without urinary obstruction 07/09/2016   Need for hepatitis C screening  test 07/09/2016   Idiopathic gout 07/09/2016   Hyperglycemia 08/01/2015   Atrial fibrillation with RVR ([place]) 03/28/2015   Routine general medical examination at a health care facility 08/11/2013   Chronic sinus bradycardia    Obstructive sleep apnea 09/12/2010   MYOCARDIAL INFARCTION 09/11/2010   Spinal stenosis, unspecified region other than cervical 09/11/2010   Hyperlipidemia with target LDL less than 70 09/08/2010   Depression 09/08/2010   Essential hypertension 09/08/2010   Coronary atherosclerosis 09/08/2010   Congestive heart failure ([place]) 09/08/2010   GERD 09/08/2010   MI, old 10/05/2002    PCP: Dellia Nims DNP   REFERRING PROVIDER: Patricia Nettle, NP  REFERRING DIAG: M79.601 (ICD-10-CM) - Pain in right arm  THERAPY DIAG:  Radiculopathy, cervical region  Muscle weakness (generalized)  Rationale for Evaluation and Treatment: Rehabilitation  ONSET DATE: 09/20/2020  SUBJECTIVE:                                                                                                                                                                                                         SUBJECTIVE STATEMENT: Currently reports of R arm pain and numbness on the fingers = 4/10 on NPRS. Pain is on the underside of the R elbow. It can tingle at times all the way to the R 4 fingers especially when he's sleeping. Patient also reports weakness on the B grip. Patient reports symptoms are also worse when he does chin tucks while seated.  Patient presents to therapy with complaint of RT arm weakness and pain. It all started after a neck surgery about 3 years ago. His LT arm went numb but he waited to see MD about it. They called it nerve damage and said there wasn't much to do. More recently, about 3 months ago, his RT arm started bothering  him as far as pain in elbow into axilla and also weakness. He feels what he needs is to stretch his neck out to give space to the nerves. He has not had  previous therapy for these issues.   PERTINENT HISTORY:  Cervical fusion 2021, 3 x open heart surgery, A-fib, Hx of MI, LT TKA, Portion of diaphragm paralyzed (patient report)   PAIN:  Are you having pain? Yes: NPRS scale: 3/10 Pain location: Rt elbow and arm  Pain description: aching, numb, tingling  Aggravating factors: turning head, bending/ lifting, shaving  Relieving factors: beer  PRECAUTIONS: Other: 20lb lifting restriction   WEIGHT BEARING RESTRICTIONS: No  FALLS:  Has patient fallen in last 6 months? No  OCCUPATION: Retired Clinical biochemist   PLOF: Independent  PATIENT GOALS: Get stretched out   NEXT MD VISIT: 02/19/23  OBJECTIVE:   DIAGNOSTIC FINDINGS:  NA  PATIENT SURVEYS:    COGNITION: Overall cognitive status: Within functional limits for tasks assessed  SENSATION: Decreased sensation to light touch about RUE   POSTURE: rounded shoulders and forward head  CERVICAL ROM:   Active ROM A/PROM (deg) eval  Flexion 42  Extension 29  Right lateral flexion   Left lateral flexion   Right rotation 45  Left rotation 47   (Blank rows = not tested)  UPPER EXTREMITY ROM:  Active ROM Right eval Left eval  Shoulder flexion 90 85  Shoulder extension    Shoulder abduction    Shoulder adduction    Shoulder extension    Shoulder internal rotation    Shoulder external rotation    Elbow flexion    Elbow extension    Wrist flexion    Wrist extension    Wrist ulnar deviation    Wrist radial deviation    Wrist pronation    Wrist supination     (Blank rows = not tested)  UPPER EXTREMITY MMT:  MMT Right eval Left eval  Shoulder flexion 4- 4-  Shoulder extension    Shoulder abduction    Shoulder adduction    Shoulder extension 4- 4-  Shoulder internal rotation 4 4  Shoulder external rotation    Middle trapezius    Lower trapezius    Elbow flexion    Elbow extension    Wrist flexion    Wrist extension    Wrist ulnar deviation    Wrist radial  deviation    Wrist pronation    Wrist supination    Grip strength     (Blank rows = not tested)   TODAY'S TREATMENT:                                                                                                                              DATE:  11/26/22 Performed and checked for R ULTT Passive R median nerve glide x 1' Self R median nerve floss x 1' Supine cervical chin tucks x 3" x 5 Self upper trapezius stretch x 30"  x 2 Shoulder rolls forward/backwards x 10 on each Scapular retraction x 5" Postural correction x 10" in sitting  11/18/22 Eval    PATIENT EDUCATION:  Education details: updated HEP Person educated: Patient Education method: Explanation, Demonstration, Corporate treasurer cues, Verbal cues, and Handouts Education comprehension: needs further education  HOME EXERCISE PROGRAM: Access Code: [email or web address] URL: [email or web address]  11/26/22 Access Code: [email or web address] URL: [email or web address] Date: 11/26/2022 Prepared by: Rexene Alberts  Exercises - Correct Seated Posture  - 2-3 x daily - 7 x weekly - 1 sets - 10 reps - 5 second hold - Seated Scapular Retraction  - 2-3 x daily - 7 x weekly - 1-2 sets - 10 reps - 5 second hold - Median Nerve Flossing - Tray  - 1-2 x daily - 7 x weekly - Seated Upper Trapezius Stretch  - 1-2 x daily - 7 x weekly - 2 reps - 30 hold  Date: 11/18/2022 Prepared by: Josue Hector  Exercises - Correct Seated Posture  - 2-3 x daily - 7 x weekly - 1 sets - 10 reps - 5 second hold - Seated Scapular Retraction  - 2-3 x daily - 7 x weekly - 1-2 sets - 10 reps - 5 second hold - Seated Cervical Retraction  - 2-3 x daily - 7 x weekly - 1-2 sets - 10 reps - 5 second hold  ASSESSMENT:  CLINICAL IMPRESSION: Interventions today were geared towards centralization of pain, cervical strengthening and correction of posture. Patient reported of partial relief and centralization of symptoms when doing the nerve floss but the pain  and numbness on the R fingers and arm would come back at the end of the intervention. Doing chin tucks in supine also helped with centralizing the pain. However, patient has difficulty breathing so only 5 reps were done. Demonstrated appropriate levels of fatigue. Required constant amount of multimodal cueing to ensure correct execution of activity particularly with the self nerve floss and upper trapezius stretch. Patient is noted to hike the shoulders on upper trapezius stretch so tactile and verbal cueing were also provided. Patient left the clinic without worsening of symptoms (remained at 4/10). Pacing of activities was slow today. To date, skilled PT is required to address the impairments and improve function.  Patient is a 71 y.o. male who presents to physical therapy with complaint of neck pain/ Rt arm weakness. Patient demonstrates decreased strength, ROM restriction, reduced flexibility, increased tenderness to palpation and postural abnormalities which are likely contributing to symptoms of pain and are negatively impacting patient ability to perform ADLs. Patient will benefit from skilled physical therapy services to address these deficits to reduce pain and improve level of function with ADLs   OBJECTIVE IMPAIRMENTS: decreased activity tolerance, decreased mobility, decreased ROM, decreased strength, hypomobility, impaired flexibility, impaired UE functional use, improper body mechanics, postural dysfunction, and pain.   ACTIVITY LIMITATIONS: carrying, lifting, bending, sleeping, bathing, toileting, dressing, reach over head, hygiene/grooming, and caring for others  PARTICIPATION LIMITATIONS: meal prep, cleaning, laundry, driving, shopping, community activity, and yard work  PERSONAL FACTORS: Past/current experiences, Time since onset of injury/illness/exacerbation, and 3+ comorbidities: See above  are also affecting patient's functional outcome.   REHAB POTENTIAL: Fair See above    CLINICAL DECISION MAKING: Stable/uncomplicated  EVALUATION COMPLEXITY: Low   GOALS: SHORT TERM GOALS: Target date: 12/30/2022  Patient will be independent with initial HEP and self-management strategies to improve functional outcomes Baseline:  Goal status: INITIAL   LONG TERM GOALS: Target date: 12/30/2022  Patient will be  independent with advanced HEP and self-management strategies to improve functional outcomes Baseline:  Goal status: INITIAL  2.  Patient will improve tested UE MMT to at least 4/5 throughout for improved ability to perform [facility] ADLs. Baseline: See MMT  Goal status: INITIAL  3.  Patient will demo improved bilat cervical rotation by 10 degrees in order to improve ability to scan environment for safety and while driving. Baseline: See AROM Goal status: INITIAL  4. Patient will report a decrease in neck pain to 0/10 at rest for improved quality of life and ability to perform UE ADLs  Baseline: 3/10 Goal status: INITIAL   5. Patient will improve bilateral shoulder flexion to at least 125 degrees for improved ability to perform [place] ADLs.  Baseline: See AROM  Goal status: INITIAL   PLAN:  PT FREQUENCY: 1-2x/week  PT DURATION: 6 weeks  PLANNED INTERVENTIONS: Therapeutic exercises, Therapeutic activity, Neuromuscular re-education, Balance training, Gait training, Patient/Family education, Joint manipulation, Joint mobilization, Stair training, Aquatic Therapy, Dry Needling, Electrical stimulation, Spinal manipulation, Spinal mobilization, Cryotherapy, Moist heat, scar mobilization, Taping, Traction, Ultrasound, Biofeedback, Ionotophoresis 66m/ml Dexamethasone, and Manual therapy.   PLAN FOR NEXT SESSION: Continue POC and may progress as tolerated with emphasis on centralization of pain, cervical strengthening, flexibility, and correction of posture.  2:46 PM, 11/26/22 KHarvie Heck Eduarda Scrivens, PT, DPT, OCS Board-Certified Clinical Specialist in [place] ([place]): [phone]

## 2022-11-27 ENCOUNTER — Telehealth: Payer: Self-pay | Admitting: Pulmonary Disease

## 2022-11-27 DIAGNOSIS — G4733 Obstructive sleep apnea (adult) (pediatric): Secondary | ICD-10-CM

## 2022-11-27 NOTE — Telephone Encounter (Signed)
Please advise 

## 2022-11-30 NOTE — Telephone Encounter (Signed)
Order placed for pt to receive supplies from DME.  Attempted to call pt but unable to reach. Left pt a detailed message letting him know that this had been done. Nothing further needed.

## 2022-11-30 NOTE — Telephone Encounter (Signed)
Patient states only needs CPAP supplies. Patient uses [facility]. Patient phone number is [phone] and [phone].

## 2022-12-03 ENCOUNTER — Ambulatory Visit (HOSPITAL_COMMUNITY): Payer: Medicare Other | Admitting: Physical Therapy

## 2022-12-03 ENCOUNTER — Telehealth: Payer: Self-pay | Admitting: Cardiology

## 2022-12-03 ENCOUNTER — Other Ambulatory Visit: Payer: Self-pay | Admitting: Cardiology

## 2022-12-03 DIAGNOSIS — M5412 Radiculopathy, cervical region: Secondary | ICD-10-CM

## 2022-12-03 DIAGNOSIS — M6281 Muscle weakness (generalized): Secondary | ICD-10-CM

## 2022-12-03 NOTE — Therapy (Signed)
OUTPATIENT PHYSICAL THERAPY CERVICAL TREATMENT   Patient Name: [name] [name] MRN: [id number] DOB:1951-08-31, 72 y.o., male Today's Date: 12/03/2022  END OF SESSION:  PT End of Session - 12/03/22 1422     Visit Number 3    Number of Visits 12    Date for PT Re-Evaluation 12/30/22    Authorization Type Medicare A and B    Progress Note Due on Visit 10    PT Start Time [id number]    PT Stop Time 1510    PT Time Calculation (min) 42 min    Activity Tolerance Patient tolerated treatment well    Behavior During Therapy WFL for tasks assessed/performed            Past Medical History:  Diagnosis Date   Anxiety    CHF (congestive heart failure) ([place])    h/o due to fluid overload in recovery room   Coronary artery disease    2v CABG, 2004 ([facility])   COVID-19    DJD (degenerative joint disease)    Dyspnea    on exertion   GERD (gastroesophageal reflux disease)    History of kidney stones    3 stones yrs. ago   HLD (hyperlipidemia)    Hypertension    MI, old 2004   OA (osteoarthritis)    Obesity    Persistent atrial fibrillation (HCC)    failed medical therapy with sotalol, s/p PVI x 2   Sleep apnea    on CPAP   Spinal stenosis    Typical atrial flutter [facility])    Past Surgical History:  Procedure Laterality Date   ANTERIOR CERVICAL DECOMP/DISCECTOMY FUSION [name] 09/21/2019   Procedure: ANTERIOR CERVICAL DECOMPRESSION/DISCECTOMY FUSION CERVICAL THREE- CERVICAL FOUR, CERVICAL FOUR- CERVICAL FIVE, CERVICAL FIVE- CERVICAL SIX;  Surgeon: Newman Pies, MD;  Location: [place];  Service: Neurosurgery;  Laterality: [name];  ANTERIOR CERVICAL DECOMPRESSION/DISCECTOMY FUSION CERVICAL THREE- CERVICAL FOUR, CERVICAL FOUR- CERVICAL FIVE, CERVICAL FIVE- CERVICAL SIX   ATRIAL FIBRILLATION ABLATION [name] 05/24/2018   Procedure: ATRIAL FIBRILLATION ABLATION;  Surgeon: Thompson Grayer, MD;  Location: [place] CV [facility];  Service: Cardiovascular;  Laterality: [name];   BACK SURGERY  2011   CARPAL TUNNEL  RELEASE     bilateral   CHOLECYSTECTOMY     CORONARY ARTERY BYPASS GRAFT     2004   ELECTROPHYSIOLOGIC STUDY [name] 09/15/2016   Procedure: Atrial Fibrillation Ablation;  Surgeon: Thompson Grayer, MD;  Location: [place] CV [facility];  Service: Cardiovascular;  Laterality: [name];   fistula surgery     hemorrhoidectomy     LEFT HEART CATH AND CORS/GRAFTS ANGIOGRAPHY [name] 02/07/2018   Procedure: LEFT HEART CATH AND CORS/GRAFTS ANGIOGRAPHY;  Surgeon: Martinique, Peter M, MD;  Location: [place] CV [facility];  Service: Cardiovascular;  Laterality: [name];   LUMBAR LAMINECTOMY     right foot fracture     TEE WITHOUT CARDIOVERSION [name] 09/15/2016   Procedure: TRANSESOPHAGEAL ECHOCARDIOGRAM (TEE);  Surgeon: Lelon Perla, MD;  Location: [facility]OS[facility];  Service: Cardiovascular;  Laterality: [name];   TOTAL KNEE ARTHROPLASTY Left 07/07/2017   Procedure: LEFT TOTAL KNEE ARTHROPLASTY;  Surgeon: Latanya Maudlin, MD;  Location: [facility];  Service: Orthopedics;  Laterality: Left;   TRANSTHORACIC ECHOCARDIOGRAM  08/25/2010   EF 55-60%   Patient Active Problem List   Diagnosis Date Noted   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   Cervical myelopathy ([place]) 10/03/2019   Myelopathy concurrent with and due to spinal stenosis of cervical region [facility]) 09/21/2019   Paroxysmal atrial fibrillation ([place]) 05/24/2018  Unstable angina (HCC) 02/04/2018   Typical atrial flutter (HCC)    Spinal stenosis    QT prolongation    Palpitations    PAF (paroxysmal atrial fibrillation) (HCC)    OA (osteoarthritis)    Hypertension    HLD (hyperlipidemia)    Edema    DJD (degenerative joint disease)    Coronary artery disease    Chronic chest pain    Hx of total knee replacement, 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   A-fib ([place]) 09/15/2016   Chest pain 09/09/2016   Obesity 08/22/2016   Anxiety state 08/22/2016   Encounter for monitoring sotalol therapy 08/20/2016   Morbidly obese ([place]) 07/12/2016   Medical non-compliance 07/12/2016   Elevated TSH 07/09/2016   BPH  without urinary obstruction 07/09/2016   Need for hepatitis C screening test 07/09/2016   Idiopathic gout 07/09/2016   Hyperglycemia 08/01/2015   Atrial fibrillation with RVR ([place]) 03/28/2015   Routine general medical examination at a health care facility 08/11/2013   Chronic sinus bradycardia    Obstructive sleep apnea 09/12/2010   MYOCARDIAL INFARCTION 09/11/2010   Spinal stenosis, unspecified region other than cervical 09/11/2010   Hyperlipidemia with target LDL less than 70 09/08/2010   Depression 09/08/2010   Essential hypertension 09/08/2010   Coronary atherosclerosis 09/08/2010   Congestive heart failure ([place]) 09/08/2010   GERD 09/08/2010   MI, old 10/05/2002    PCP: Dellia Nims DNP   REFERRING PROVIDER: Patricia Nettle, NP  REFERRING DIAG: M79.601 (ICD-10-CM) - Pain in right arm  THERAPY DIAG:  Radiculopathy, cervical region  Muscle weakness (generalized)  Rationale for Evaluation and Treatment: Rehabilitation  ONSET DATE: 09/20/2020  SUBJECTIVE:                                                                                                                                                                                                         SUBJECTIVE STATEMENT: Patient says he was advised not to do traction because of his cervical fusion. He has been doing home exercise but has not noticed much improvement yet. Still having pain in RUE from neck down to Rt hand.    Patient presents to therapy with complaint of RT arm weakness and pain. It all started after a neck surgery about 3 years ago. His LT arm went numb but he waited to see MD about it. They called it nerve damage and said there wasn't much to do. More recently, about 3 months ago, his RT arm started bothering him as far as pain in elbow into axilla and also weakness. He feels  what he needs is to stretch his neck out to give space to the nerves. He has not had previous therapy for these issues.   PERTINENT  HISTORY:  Cervical fusion 2021, 3 x open heart surgery, A-fib, Hx of MI, LT TKA, Portion of diaphragm paralyzed (patient report)   PAIN:  Are you having pain? Yes: NPRS scale: 3/10 Pain location: Rt elbow and arm  Pain description: aching, numb, tingling  Aggravating factors: turning head, bending/ lifting, shaving  Relieving factors: beer  PRECAUTIONS: Other: 20lb lifting restriction   WEIGHT BEARING RESTRICTIONS: No  FALLS:  Has patient fallen in last 6 months? No  OCCUPATION: Retired Clinical biochemist   PLOF: Independent  PATIENT GOALS: Get stretched out   NEXT MD VISIT: 02/19/23  OBJECTIVE:   DIAGNOSTIC FINDINGS:  NA  PATIENT SURVEYS:    COGNITION: Overall cognitive status: Within functional limits for tasks assessed  SENSATION: Decreased sensation to light touch about RUE   POSTURE: rounded shoulders and forward head  CERVICAL ROM:   Active ROM A/PROM (deg) eval  Flexion 42  Extension 29  Right lateral flexion   Left lateral flexion   Right rotation 45  Left rotation 47   (Blank rows = not tested)  UPPER EXTREMITY ROM:  Active ROM Right eval Left eval  Shoulder flexion 90 85  Shoulder extension    Shoulder abduction    Shoulder adduction    Shoulder extension    Shoulder internal rotation    Shoulder external rotation    Elbow flexion    Elbow extension    Wrist flexion    Wrist extension    Wrist ulnar deviation    Wrist radial deviation    Wrist pronation    Wrist supination     (Blank rows = not tested)  UPPER EXTREMITY MMT:  MMT Right eval Left eval  Shoulder flexion 4- 4-  Shoulder extension    Shoulder abduction    Shoulder adduction    Shoulder extension 4- 4-  Shoulder internal rotation 4 4  Shoulder external rotation    Middle trapezius    Lower trapezius    Elbow flexion    Elbow extension    Wrist flexion    Wrist extension    Wrist ulnar deviation    Wrist radial deviation    Wrist pronation    Wrist supination     Grip strength     (Blank rows = not tested)   TODAY'S TREATMENT:                                                                                                                              DATE:  12/03/22 Chin tuck 10 x 5" Scap retraction 10 x 5"  Band rows RTB 2 x 10  Radial nerve glides seated 2 x 10   11/26/22 Performed and checked for R ULTT Passive R median nerve glide x 1' Self R median nerve floss x 1'  Supine cervical chin tucks x 3" x 5 Self upper trapezius stretch x 30" x 2 Shoulder rolls forward/backwards x 10 on each Scapular retraction x 5" Postural correction x 10" in sitting  11/18/22 Eval    PATIENT EDUCATION:  Education details: updated HEP Person educated: Patient Education method: Consulting civil engineer, Demonstration, Corporate treasurer cues, Verbal cues, and Handouts Education comprehension: needs further education  HOME EXERCISE PROGRAM: Access Code: [email or web address] URL: [email or web address]  11/26/22 Access Code: [email or web address] URL: [email or web address] Date: 11/26/2022 Prepared by: Rexene Alberts  Exercises - Correct Seated Posture  - 2-3 x daily - 7 x weekly - 1 sets - 10 reps - 5 second hold - Seated Scapular Retraction  - 2-3 x daily - 7 x weekly - 1-2 sets - 10 reps - 5 second hold - Median Nerve Flossing - Tray  - 1-2 x daily - 7 x weekly - Seated Upper Trapezius Stretch  - 1-2 x daily - 7 x weekly - 2 reps - 30 hold  Date: 11/18/2022 Prepared by: Josue Hector  Exercises - Correct Seated Posture  - 2-3 x daily - 7 x weekly - 1 sets - 10 reps - 5 second hold - Seated Scapular Retraction  - 2-3 x daily - 7 x weekly - 1-2 sets - 10 reps - 5 second hold - Seated Cervical Retraction  - 2-3 x daily - 7 x weekly - 1-2 sets - 10 reps - 5 second hold  ASSESSMENT:  CLINICAL IMPRESSION: Patient continues to have questions about cervical traction. Discussed pros and cons and that he shoulder have a discussion with his referring provider if  wanting to further pursue this. Continued with postural strengthening and nerve glides for improved radicular sx. Patient is agreeable with therapy plan but voices some frustration as his initial impression is that he would have access to cervical traction when he started therapy. Patient will continue to benefit from skilled therapy services to reduce remaining deficits and improve functional ability.   OBJECTIVE IMPAIRMENTS: decreased activity tolerance, decreased mobility, decreased ROM, decreased strength, hypomobility, impaired flexibility, impaired UE functional use, improper body mechanics, postural dysfunction, and pain.   ACTIVITY LIMITATIONS: carrying, lifting, bending, sleeping, bathing, toileting, dressing, reach over head, hygiene/grooming, and caring for others  PARTICIPATION LIMITATIONS: meal prep, cleaning, laundry, driving, shopping, community activity, and yard work  PERSONAL FACTORS: Past/current experiences, Time since onset of injury/illness/exacerbation, and 3+ comorbidities: See above  are also affecting patient's functional outcome.   REHAB POTENTIAL: Fair See above   CLINICAL DECISION MAKING: Stable/uncomplicated  EVALUATION COMPLEXITY: Low   GOALS: SHORT TERM GOALS: Target date: 12/30/2022  Patient will be independent with initial HEP and self-management strategies to improve functional outcomes Baseline:  Goal status: INITIAL   LONG TERM GOALS: Target date: 12/30/2022  Patient will be independent with advanced HEP and self-management strategies to improve functional outcomes Baseline:  Goal status: INITIAL  2.  Patient will improve tested UE MMT to at least 4/5 throughout for improved ability to perform [facility] ADLs. Baseline: See MMT  Goal status: INITIAL  3.  Patient will demo improved bilat cervical rotation by 10 degrees in order to improve ability to scan environment for safety and while driving. Baseline: See AROM Goal status: INITIAL  4. Patient will  report a decrease in neck pain to 0/10 at rest for improved quality of life and ability to perform UE ADLs  Baseline: 3/10 Goal status: INITIAL   5. Patient will improve bilateral shoulder flexion to at least 125 degrees for  improved ability to perform [facility] ADLs.  Baseline: See AROM  Goal status: INITIAL   PLAN:  PT FREQUENCY: 1-2x/week  PT DURATION: 6 weeks  PLANNED INTERVENTIONS: Therapeutic exercises, Therapeutic activity, Neuromuscular re-education, Balance training, Gait training, Patient/Family education, Joint manipulation, Joint mobilization, Stair training, Aquatic Therapy, Dry Needling, Electrical stimulation, Spinal manipulation, Spinal mobilization, Cryotherapy, Moist heat, scar mobilization, Taping, Traction, Ultrasound, Biofeedback, Ionotophoresis '4mg'$ /ml Dexamethasone, and Manual therapy.   PLAN FOR NEXT SESSION: Continue POC and may progress as tolerated with emphasis on centralization of pain, cervical strengthening, flexibility, and correction of posture.  3:16 PM, 12/03/22 Josue Hector PT DPT  Physical Therapist with [facility]  [phone]

## 2022-12-03 NOTE — Telephone Encounter (Signed)
01/01/23 left message to call office.

## 2022-12-03 NOTE — Telephone Encounter (Signed)
Follow Up:    Patient is calling to see if his Monitor results are ready?

## 2022-12-07 ENCOUNTER — Ambulatory Visit: Payer: Medicare Other | Attending: Nurse Practitioner | Admitting: Nurse Practitioner

## 2022-12-07 VITALS — BP 126/84 | HR 67 | Ht 66.0 in | Wt 238.6 lb

## 2022-12-07 DIAGNOSIS — G4733 Obstructive sleep apnea (adult) (pediatric): Secondary | ICD-10-CM

## 2022-12-07 DIAGNOSIS — I483 Typical atrial flutter: Secondary | ICD-10-CM | POA: Diagnosis present

## 2022-12-07 DIAGNOSIS — M5412 Radiculopathy, cervical region: Secondary | ICD-10-CM | POA: Diagnosis present

## 2022-12-07 DIAGNOSIS — Z79899 Other long term (current) drug therapy: Secondary | ICD-10-CM | POA: Diagnosis present

## 2022-12-07 DIAGNOSIS — R0602 Shortness of breath: Secondary | ICD-10-CM | POA: Diagnosis present

## 2022-12-07 DIAGNOSIS — E785 Hyperlipidemia, unspecified: Secondary | ICD-10-CM | POA: Diagnosis present

## 2022-12-07 DIAGNOSIS — I10 Essential (primary) hypertension: Secondary | ICD-10-CM

## 2022-12-07 DIAGNOSIS — M6281 Muscle weakness (generalized): Secondary | ICD-10-CM | POA: Diagnosis present

## 2022-12-07 DIAGNOSIS — I471 Supraventricular tachycardia, unspecified: Secondary | ICD-10-CM

## 2022-12-07 DIAGNOSIS — Z8679 Personal history of other diseases of the circulatory system: Secondary | ICD-10-CM | POA: Diagnosis present

## 2022-12-07 DIAGNOSIS — R079 Chest pain, unspecified: Secondary | ICD-10-CM | POA: Diagnosis present

## 2022-12-07 DIAGNOSIS — I25119 Atherosclerotic heart disease of native coronary artery with unspecified angina pectoris: Secondary | ICD-10-CM

## 2022-12-07 DIAGNOSIS — R002 Palpitations: Secondary | ICD-10-CM

## 2022-12-07 DIAGNOSIS — I4891 Unspecified atrial fibrillation: Secondary | ICD-10-CM | POA: Diagnosis present

## 2022-12-07 MED ORDER — ATORVASTATIN CALCIUM 20 MG PO TABS: 20.0000 mg | ORAL_TABLET | Freq: Every day | ORAL | 1 refills | Status: DC

## 2022-12-07 MED ORDER — METOPROLOL TARTRATE 25 MG PO TABS: 25.0000 mg | ORAL_TABLET | Freq: Two times a day (BID) | ORAL | 1 refills | Status: DC

## 2022-12-07 MED ORDER — EZETIMIBE 10 MG PO TABS: 10.0000 mg | ORAL_TABLET | Freq: Every day | ORAL | 1 refills | Status: DC

## 2022-12-07 NOTE — Telephone Encounter (Signed)
Addressed during 12/07/22 office visit with Finis Bud.

## 2022-12-07 NOTE — Telephone Encounter (Signed)
Discussed during OV today

## 2022-12-07 NOTE — Patient Instructions (Signed)
Medication Instructions:  Your physician has recommended you make the following change in your medication:  Take metoprolol 25 mg twice a day Take atorvastatin 20 mg once a day Take zetia 10 mg once a day Continue all other medications as directed  Labwork: FLP, LFT (due in 2 months at [facility])  Testing/Procedures: none  Follow-Up:  Your physician recommends that you schedule a follow-up appointment in: 6 months  Any Other Special Instructions Will Be Listed Below (If Applicable).  You have been referred to Electrophysiology   If you need a refill on your cardiac medications before your next appointment, please call your pharmacy.

## 2022-12-07 NOTE — Progress Notes (Signed)
Cardiology Office Note:    Date: 12/07/2022  ID:  [name] [name], DOB [birth year], MRN [id number]  PCP:  Tempie Hoist, [place] Providers Cardiologist:  Carlyle Dolly, MD Electrophysiologist:  Thompson Grayer, MD (Inactive)      Referring MD: Tempie Hoist, FNP   CC: Here for follow-up regarding chest tightness and palpitations  History of Present Illness:    [name] [name] is a 72 y.o. male with a hx of the following:   CAD, s/p CABG Hypertension Hyperlipidemia A-fib, s/p ablation in 2019 OSA, shortness of breath  Patient is a very pleasant 72 year old male with past medical history as mentioned above.  Hx of two-vessel CABG in 2004 at [facility]. Cardiac catheterization 2006 at [facility] with patent grafts.  NST in 2000 level is negative in [place].  Lexiscan in 2016 was negative. Cardiac cath in 2019 showed native disease with patent grafts. Ablation for A-fib performed in 2019 with repeat ablation 3 months later that year.  Last seen by Dr. Carlyle Dolly on May 21, 2022. 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 Was following Dr. Elsworth Soho for his OSA.    Today he presents for follow-up evaluation. Atypical CP noticed for past few days, attributes this to pinched nerve, wife says he has been working with physical therapy recently.  Unable to tolerate metoprolol dosage at 2 tablets twice daily.  Takes an extra tablet daily as needed if heart rate is greater than 110 bpm.  Unable to tolerate atorvastatin 40 mg daily.  Overall doing better from last visit.  Reviewed/discussed monitor results. Denies any syncope, presyncope, dizziness, orthopnea, PND, swelling or significant weight changes, acute bleeding, or claudication.  Past Medical History:  Diagnosis Date   Anxiety    CHF (congestive heart failure) ([place])    h/o due to fluid overload in recovery room   Coronary artery disease    2v CABG,  2004 ([facility])   COVID-19    DJD (degenerative joint disease)    Dyspnea    on exertion   GERD (gastroesophageal reflux disease)    History of kidney stones    3 stones yrs. ago   HLD (hyperlipidemia)    Hypertension    MI, old 2004   OA (osteoarthritis)    Obesity    Persistent atrial fibrillation (HCC)    failed medical therapy with sotalol, s/p PVI x 2   Sleep apnea    on CPAP   Spinal stenosis    Typical atrial flutter [facility])     Past Surgical History:  Procedure Laterality Date   ANTERIOR CERVICAL DECOMP/DISCECTOMY FUSION [name] 09/21/2019   Procedure: ANTERIOR CERVICAL DECOMPRESSION/DISCECTOMY FUSION CERVICAL THREE- CERVICAL FOUR, CERVICAL FOUR- CERVICAL FIVE, CERVICAL FIVE- CERVICAL SIX;  Surgeon: Newman Pies, MD;  Location: [place];  Service: Neurosurgery;  Laterality: [name];  ANTERIOR CERVICAL DECOMPRESSION/DISCECTOMY FUSION CERVICAL THREE- CERVICAL FOUR, CERVICAL FOUR- CERVICAL FIVE, CERVICAL FIVE- CERVICAL SIX   ATRIAL FIBRILLATION ABLATION [name] 05/24/2018   Procedure: ATRIAL FIBRILLATION ABLATION;  Surgeon: Thompson Grayer, MD;  Location: [place] [facility];  Service: Cardiovascular;  Laterality: [name];   BACK SURGERY  2011   CARPAL TUNNEL RELEASE     bilateral   CHOLECYSTECTOMY     CORONARY ARTERY BYPASS GRAFT     2004   ELECTROPHYSIOLOGIC STUDY [name] 09/15/2016   Procedure: Atrial Fibrillation Ablation;  Surgeon: Thompson Grayer, MD;  Location: [place] [facility];  Service: Cardiovascular;  Laterality: [name];   fistula surgery     hemorrhoidectomy     LEFT HEART CATH AND CORS/GRAFTS ANGIOGRAPHY [name] 02/07/2018   Procedure: LEFT HEART CATH AND CORS/GRAFTS ANGIOGRAPHY;  Surgeon: Martinique, Peter M, MD;  Location: [place] [facility];  Service: Cardiovascular;  Laterality: [name];   LUMBAR LAMINECTOMY     right foot fracture     TEE WITHOUT CARDIOVERSION [name] 09/15/2016   Procedure: TRANSESOPHAGEAL ECHOCARDIOGRAM (TEE);  Surgeon: Lelon Perla, MD;  Location: [facility];  Service:  Cardiovascular;  Laterality: [name];   TOTAL KNEE ARTHROPLASTY Left 07/07/2017   Procedure: LEFT TOTAL KNEE ARTHROPLASTY;  Surgeon: Latanya Maudlin, MD;  Location: [facility];  Service: Orthopedics;  Laterality: Left;   TRANSTHORACIC ECHOCARDIOGRAM  08/25/2010   EF 55-60%    Current Meds  Medication Sig   acetaminophen (TYLENOL) 325 MG tablet Take 2 tablets (650 mg total) by mouth every 4 (four) hours as needed for mild pain ((score 1 to 3) or temp > 100.5).   albuterol (VENTOLIN HFA) 108 (90 Base) MCG/ACT inhaler Inhale 2 puffs into the lungs every 6 (six) hours as needed.   atorvastatin (LIPITOR) 40 MG tablet Take 0.5 tablets (20 mg total) by mouth daily.   Coenzyme Q10 (CO Q 10 PO) Take 100 mg by mouth daily.   diltiazem (CARDIZEM CD) 180 MG 24 hr capsule TAKE 1 CAPSULE BY MOUTH EVERY DAY   ELIQUIS 5 MG TABS tablet TAKE 1 TABLET BY MOUTH TWICE A DAY   furosemide (LASIX) 20 MG tablet TAKE 2 TABLETS BY MOUTH EVERY DAY   hydrALAZINE (APRESOLINE) 100 MG tablet TAKE 1 TABLET BY MOUTH 3 TIMES DAILY.   irbesartan (AVAPRO) 300 MG tablet TAKE 1 TABLET BY MOUTH EVERY DAY   LORazepam (ATIVAN) 0.5 MG tablet Take 0.5 mg by mouth every 8 (eight) hours.   meclizine (ANTIVERT) 25 MG tablet Take 25 mg by mouth 3 (three) times daily as needed.   metoprolol tartrate (LOPRESSOR) 25 MG tablet Take 1 tablet (25 mg total) by mouth 2 (two) times daily.   Multiple Vitamin (MULTIVITAMIN) tablet Take 1 tablet by mouth daily.   nitroGLYCERIN (NITROSTAT) 0.4 MG SL tablet Place 1 tablet (0.4 mg total) under the tongue every 5 (five) minutes x 3 doses as needed for chest pain.   omeprazole (PRILOSEC) 20 MG capsule TAKE 1 CAPSULE BY MOUTH EVERY OTHER DAY   PRESCRIPTION MEDICATION Pt uses BiPAP machine at bedtime   sertraline (ZOLOFT) 100 MG tablet Take 100 mg by mouth daily.    Allergies:   Vancomycin, Aldactone [spironolactone], Chlorthalidone, Nsaids, and Coreg [carvedilol]   Social History   Socioeconomic History    Marital status: Married    Spouse name: Not on file   Number of children: 0   Years of education: Not on file   Highest education level: Not on file  Occupational History   Occupation: electrician    Employer: [employer]  Tobacco Use   Smoking status: Never   Smokeless tobacco: Never  Vaping Use   Vaping Use: Never used  Substance and Sexual Activity   Alcohol use: Yes    Alcohol/week: 0.0 standard drinks of alcohol    Comment: ocassional   Drug use: No   Sexual activity: Yes    Partners: Female  Other Topics Concern   Not on file  Social History Narrative   Not on file   Social Determinants of Health   Financial Resource Strain: Not on file  Food Insecurity:  Not on file  Transportation Needs: Not on file  Physical Activity: Sufficiently Active (02/05/2018)   Exercise Vital Sign    Days of Exercise per Week: 7 days    Minutes of Exercise per Session: 30 min  Stress: No Stress Concern Present (02/05/2018)   [place]    Feeling of Stress : Only a little  Social Connections: Not on file     Family History: The patient's family history includes Breast cancer in his mother; Coronary artery disease in his father; Hypertension in his father and mother; Stroke in his father.  ROS:    Please see the history of present illness.    All other systems reviewed and are negative.  EKGs/Labs/Other Studies Reviewed:    The following studies were reviewed today:   EKG:  EKG is ordered today.  EKG demonstrates sinus rhythm, 60 bpm, with PACs, occasional PVCs, incomplete RBBB, nonspecific ST/T wave abnormality (seen on previous EKG), no acute ischemic changes.  Cardiac monitor on 12/03/2022:    7 day montitor   Rare supraventricular ectopy in the form of isolated PACs, couplets, triplets. Ten runs of SVT longest 19 beats.   Occasional ventricular ectopy in the form of isolated PVCs. Rare couplets,  triplets. Two runs of NSVT longest 4 beats   Symptoms correlated with sinus rhythm, PACs     Patch Wear Time:  7 days and 0 hours (2024-02-02T10:07:44-0500 to 2024-02-09T10:19:04-499)   Patient had a min HR of 49 bpm, max HR of 182 bpm, and avg HR of 69 bpm. Predominant underlying rhythm was Sinus Rhythm. 2 Ventricular Tachycardia runs occurred, the run with the fastest interval lasting 4 beats with a max rate of 182 bpm, the longest  lasting 4 beats with an avg rate of 135 bpm. 10 Supraventricular Tachycardia runs occurred, the run with the fastest interval lasting 19 beats with a max rate of 158 bpm, the longest lasting 18 beats with an avg rate of 100 bpm. Some episodes of  Supraventricular Tachycardia may be possible Atrial Tachycardia with variable block. Isolated SVEs were rare (<1.0%, 5932), SVE Couplets were rare (<1.0%, 99), and SVE Triplets were rare (<1.0%, 17). Isolated VEs were occasional (1.1%, 6701), VE Couplets  were rare (<1.0%, 34), and VE Triplets were rare (<1.0%, 2). Ventricular Trigeminy was present.  48 hour holter monitor on 12/27/2018: 48 hr holter monitor Min HR 46, Max HR 106, Avg HR 62 Rare ventricular ectopy, all in the form of isolated PVCs Occasional supraventricular ectopy, primarily as isolated PACs. One 3 beat run of atach. No significant arrhythmias  Atrial fibrillation ablation on 05/24/2018: CONCLUSIONS: 1. Sinus rhythm upon presentation.   2. Intracardiac echo reveals a moderate sized left atrium with four separate pulmonary veins without evidence of pulmonary vein stenosis.  There was no atrial appendage thrombus. 3.  The left superior and left inferior pulmonary veins were quiescent from the prior ablation.  There was minimal return of electrical activity within the right inferior pulmonary veins at baseline.  The right superior pulmonary vein had exit conduction but not entrance conduction.  4. Successful electrical re-isolation of the right inferior and  superior pulmonary veins 5. Left atrial flutter induced and ablated long the roof of the left atrium. 6. Additional ablation along the floor of the left atrium in order to create a standard "box" lesion along the posterior wall due to persistence of atrial arrhythmias. 7. Cavo-tricuspid isthmus ablation was performed with complete bidirectional isthmus  block achieved.  8. No inducible arrhythmias following ablation both on and off of Isuprel 9. No early apparent complications.  CT Cardiac on 03/31/2018: IMPRESSION: 1.  No LAA thrombus   2.  Mild bi atrial enlargement   3. Normal PV anatomy dimensions as above near common ostium for right sided veins   4.  No pericardial effusion   5.  Esophagus courses closest to the RLPV   6.  Normal aortic root 3.3 cm   7. Appears to be patent LIMA to LAD and patent SVG to Diagonal branch  Echocardiogram on 03/17/2018: Study Conclusions   - Left ventricle: The cavity size was normal. There was mild    concentric hypertrophy. Systolic function was normal. The    estimated ejection fraction was in the range of 60% to 65%. Wall    motion was normal; there were no regional wall motion    abnormalities. Features are consistent with a pseudonormal left    ventricular filling pattern, with concomitant abnormal relaxation    and increased filling pressure (grade 2 diastolic dysfunction).    Doppler parameters are consistent with high ventricular filling    pressure.  - Aortic valve: Mildly to moderately calcified annulus. Trileaflet.  Left heart cath on 02/07/2018: Prox LAD lesion is 100% stenosed. Prox Cx to Mid Cx lesion is 60% stenosed. Post Atrio lesion is 100% stenosed. SVG graft was visualized by angiography and is large. The graft exhibits minimal luminal irregularities. LIMA graft was visualized by angiography and is normal in caliber. The graft exhibits no disease. The left ventricular systolic function is normal. LV end diastolic  pressure is normal. The left ventricular ejection fraction is 55-65% by visual estimate.   1. 2 vessel occlusive CAD    - 100% proximal LAD    - 60% mid LCx    - 100% PLOM with left to right collaterals. 2. Patent LIMA to the LAD 3. Patent SVG to the diagonal 4. Normal LV function 5. Normal LVEDP   Plan: continue medical management. May resume Eliquis this evening if no bleeding problems. The PLOM CTO is not suitable for PCI.   Lexiscan on 02/27/2016: No T wave inversion was noted during stress. There was no ST segment deviation noted during stress. The study is normal. This is a low risk study. The left ventricular ejection fraction is normal (55-65%). Nuclear stress EF: 61%.   Normal Lexiscan Myoview study. No evidence of ischemia.  Normal LV function with EF of 61%.   Recent Labs: No results found for requested labs within last 365 days.  Recent Lipid Panel    Component Value Date/Time   CHOL 173 05/17/2020 0819   TRIG 131 05/17/2020 0819   HDL 45 05/17/2020 0819   CHOLHDL 3.8 05/17/2020 0819   VLDL 26 05/17/2020 0819   LDLCALC 102 (H) 05/17/2020 0819     Risk Assessment/Calculations:    CHA2DS2-VASc Score = 2  This indicates a 2.2% annual risk of stroke. The patient's score is based upon: CHF History: 0 HTN History: 1 Diabetes History: 0 Stroke History: 0 Vascular Disease History: 0 Age Score: 1 Gender Score: 0     Physical Exam:    VS:  BP 126/84   Pulse 67   Ht '5\' 6"'$  (1.676 m)   Wt 238 lb 9.6 oz (108.2 kg)   SpO2 95%   BMI 38.51 kg/m     Wt Readings from Last 3 Encounters:  12/07/22 238 lb 9.6 oz (108.2 kg)  11/06/22 230 lb 3.2 oz (104.4 kg)  06/22/22 230 lb 9.6 oz (104.6 kg)     GEN: Obese 72 y.o. male in no acute distress HEENT: Normal NECK: No JVD; No carotid bruits CARDIAC: S1/S2, RRR, no murmurs, rubs, gallops; 2+ pulses RESPIRATORY:  Clear to auscultation without rales, wheezing or rhonchi  MUSCULOSKELETAL:  No edema; No deformity   SKIN: Warm and dry NEUROLOGIC:  Alert and oriented x 3 PSYCHIATRIC:  Normal affect   ASSESSMENT:    1. Chest pain of uncertain etiology   2. Coronary artery disease involving native heart with angina pectoris, unspecified vessel or lesion type (HCC)   3. Palpitations   4. SVT (supraventricular tachycardia)   5. History of atrial fibrillation   6. Medication management   7. Essential hypertension, benign   8. Hyperlipidemia, unspecified hyperlipidemia type   9. Obstructive sleep apnea   10. Shortness of breath    PLAN:    In order of problems listed above:  Chest pain of uncertain etiology, CAD  Atypical CP, not associated with exertion, attributes this to a pinched nerve.  LHC in 2019 revealed two-vessel) with patent LIMA to the LAD, patent SVG to the diagonal.  Medical management recommended. Unable to do ischemic evaluation as he cannot lay down for (see below).  EKG today does not reveal any acute ischemic changes. Could not tolerate increased Lopressor, instructed to take Lopressor 1 tablet BID, and may take extra tablet daily PRN for palpitations or HR > 110. Continue irbesartan and atorvastatin. Heart healthy diet and regular cardiovascular exercise encouraged.  ED precautions discussed.  Palpitations, SVT; hx of A-fib, status post ablation Recent monitor revealed rare supraventricular ectopy, 10 runs of SVT with longest episode lasting 19 beats, occasional ventricular ectopy, 2 runs of NSVT, brief in duration with longest at 4 beats, symptoms correlated with sinus rhythm and PACs. Continue diltiazem and Eliquis. Instructed regarding Lopressor - see above. He is on appropriate dosage.  Denies any bleeding issues. Heart healthy diet and regular cardiovascular exercise encouraged. Will refer to EP as he cannot tolerate higher dose of BB. Previously saw Dr. Rayann Heman.   HTN  Blood pressure stable.  BP well-controlled at home.  Continue Lopressor as mentioned above.  Continue rest of  current medication regimen. Heart healthy diet and regular cardiovascular exercise encouraged.  HLD Past lipid panel revealed LDL at 107.  Previously planned for atorvastatin to be increased to 40 mg daily, however patient states was not able to tolerate Lipitor at higher dose, continue current dose of Atorvastatin.  Will start Zetia 10 mg daily and recheck FLP and LFT in 2 months per protocol. Heart healthy diet and regular cardiovascular exercise encouraged.   OSA, shortness of breath Continue to follow-up with Dr. Elsworth Soho for OSA. 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 Unable to lay flat; therefore, would not be able to tolerate ischemic evaluation. ED precautions discussed.   6. Disposition: Follow-up with Dr. Carlyle Dolly or APP in 6 months or sooner if anything changes.    Medication Adjustments/Labs and Tests Ordered: Current medicines are reviewed at length with the patient today.  Concerns regarding medicines are outlined above.  Orders Placed This Encounter  Procedures   Lipid panel   Hepatic function panel   Ambulatory referral to Cardiac Electrophysiology   EKG 12-Lead   Meds ordered this encounter  Medications   metoprolol tartrate (LOPRESSOR) 25 MG tablet    Sig: Take 1 tablet (25  mg total) by mouth 2 (two) times daily. May take an additional tablet once a day if heart rate is greater than 110    Dispense:  180 tablet    Refill:  1    11/06/2022 dose increase   atorvastatin (LIPITOR) 20 MG tablet    Sig: Take 1 tablet (20 mg total) by mouth daily.    Dispense:  90 tablet    Refill:  1    11/06/2022 dose increase   ezetimibe (ZETIA) 10 MG tablet    Sig: Take 1 tablet (10 mg total) by mouth daily.    Dispense:  90 tablet    Refill:  1    12/07/22 New Start    Patient Instructions  Medication Instructions:  Your physician has recommended you make the following change in your medication:  Take metoprolol 25 mg  twice a day Take atorvastatin 20 mg once a day Take zetia 10 mg once a day Continue all other medications as directed  Labwork: FLP, LFT (due in 2 months at [facility])  Testing/Procedures: none  Follow-Up:  Your physician recommends that you schedule a follow-up appointment in: 6 months  Any Other Special Instructions Will Be Listed Below (If Applicable).  You have been referred to Electrophysiology   If you need a refill on your cardiac medications before your next appointment, please call your pharmacy.    SignedFinis Bud, NP  12/07/2022 12:01 PM    [place]

## 2022-12-08 ENCOUNTER — Ambulatory Visit (HOSPITAL_COMMUNITY): Payer: Medicare Other | Admitting: Physical Therapy

## 2022-12-08 ENCOUNTER — Encounter (HOSPITAL_COMMUNITY): Payer: Self-pay | Admitting: Physical Therapy

## 2022-12-08 DIAGNOSIS — R079 Chest pain, unspecified: Secondary | ICD-10-CM | POA: Diagnosis not present

## 2022-12-08 DIAGNOSIS — M5412 Radiculopathy, cervical region: Secondary | ICD-10-CM

## 2022-12-08 DIAGNOSIS — M6281 Muscle weakness (generalized): Secondary | ICD-10-CM

## 2022-12-08 NOTE — Therapy (Signed)
OUTPATIENT PHYSICAL THERAPY CERVICAL TREATMENT   Patient Name: [name] [name] MRN: [id number] DOB:March 23, 1951, 72 y.o., male Today's Date: 12/08/2022  END OF SESSION:  PT End of Session - 12/08/22 1342     Visit Number 4    Number of Visits 12    Date for PT Re-Evaluation 12/30/22    Authorization Type Medicare A and B    Progress Note Due on Visit 10    PT Start Time [id number]    PT Stop Time 1425    PT Time Calculation (min) 40 min    Activity Tolerance Patient tolerated treatment well    Behavior During Therapy WFL for tasks assessed/performed            Past Medical History:  Diagnosis Date   Anxiety    CHF (congestive heart failure) ([place])    h/o due to fluid overload in recovery room   Coronary artery disease    2v CABG, 2004 ([facility])   COVID-19    DJD (degenerative joint disease)    Dyspnea    on exertion   GERD (gastroesophageal reflux disease)    History of kidney stones    3 stones yrs. ago   HLD (hyperlipidemia)    Hypertension    MI, old 2004   OA (osteoarthritis)    Obesity    Persistent atrial fibrillation (HCC)    failed medical therapy with sotalol, s/p PVI x 2   Sleep apnea    on CPAP   Spinal stenosis    Typical atrial flutter [facility])    Past Surgical History:  Procedure Laterality Date   ANTERIOR CERVICAL DECOMP/DISCECTOMY FUSION [name] 09/21/2019   Procedure: ANTERIOR CERVICAL DECOMPRESSION/DISCECTOMY FUSION CERVICAL THREE- CERVICAL FOUR, CERVICAL FOUR- CERVICAL FIVE, CERVICAL FIVE- CERVICAL SIX;  Surgeon: Newman Pies, MD;  Location: [place];  Service: Neurosurgery;  Laterality: [name];  ANTERIOR CERVICAL DECOMPRESSION/DISCECTOMY FUSION CERVICAL THREE- CERVICAL FOUR, CERVICAL FOUR- CERVICAL FIVE, CERVICAL FIVE- CERVICAL SIX   ATRIAL FIBRILLATION ABLATION [name] 05/24/2018   Procedure: ATRIAL FIBRILLATION ABLATION;  Surgeon: Thompson Grayer, MD;  Location: [place] [facility];  Service: Cardiovascular;  Laterality: [name];   BACK SURGERY  2011   CARPAL TUNNEL  RELEASE     bilateral   CHOLECYSTECTOMY     CORONARY ARTERY BYPASS GRAFT     2004   ELECTROPHYSIOLOGIC STUDY [name] 09/15/2016   Procedure: Atrial Fibrillation Ablation;  Surgeon: Thompson Grayer, MD;  Location: [place] [facility];  Service: Cardiovascular;  Laterality: [name];   fistula surgery     hemorrhoidectomy     LEFT HEART CATH AND CORS/GRAFTS ANGIOGRAPHY [name] 02/07/2018   Procedure: LEFT HEART CATH AND CORS/GRAFTS ANGIOGRAPHY;  Surgeon: Martinique, Peter M, MD;  Location: [place] [facility];  Service: Cardiovascular;  Laterality: [name];   LUMBAR LAMINECTOMY     right foot fracture     TEE WITHOUT CARDIOVERSION [name] 09/15/2016   Procedure: TRANSESOPHAGEAL ECHOCARDIOGRAM (TEE);  Surgeon: Lelon Perla, MD;  Location: [facility];  Service: Cardiovascular;  Laterality: [name];   TOTAL KNEE ARTHROPLASTY Left 07/07/2017   Procedure: LEFT TOTAL KNEE ARTHROPLASTY;  Surgeon: Latanya Maudlin, MD;  Location: [facility];  Service: Orthopedics;  Laterality: Left;   TRANSTHORACIC ECHOCARDIOGRAM  08/25/2010   EF 55-60%   Patient Active Problem List   Diagnosis Date Noted   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   Cervical myelopathy ([place]) 10/03/2019   Myelopathy concurrent with and due to spinal stenosis of cervical region [facility]) 09/21/2019   Paroxysmal atrial fibrillation ([place]) 05/24/2018  Unstable angina (HCC) 02/04/2018   Typical atrial flutter (HCC)    Spinal stenosis    QT prolongation    Palpitations    PAF (paroxysmal atrial fibrillation) (HCC)    OA (osteoarthritis)    Hypertension    HLD (hyperlipidemia)    Edema    DJD (degenerative joint disease)    Coronary artery disease    Chronic chest pain    Hx of total knee replacement, 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   A-fib ([place]) 09/15/2016   Chest pain 09/09/2016   Obesity 08/22/2016   Anxiety state 08/22/2016   Encounter for monitoring sotalol therapy 08/20/2016   Morbidly obese ([place]) 07/12/2016   Medical non-compliance 07/12/2016   Elevated TSH 07/09/2016   BPH  without urinary obstruction 07/09/2016   Need for hepatitis C screening test 07/09/2016   Idiopathic gout 07/09/2016   Hyperglycemia 08/01/2015   Atrial fibrillation with RVR ([place]) 03/28/2015   Routine general medical examination at a health care facility 08/11/2013   Chronic sinus bradycardia    Obstructive sleep apnea 09/12/2010   MYOCARDIAL INFARCTION 09/11/2010   Spinal stenosis, unspecified region other than cervical 09/11/2010   Hyperlipidemia with target LDL less than 70 09/08/2010   Depression 09/08/2010   Essential hypertension 09/08/2010   Coronary atherosclerosis 09/08/2010   Congestive heart failure ([place]) 09/08/2010   GERD 09/08/2010   MI, old 10/05/2002    PCP: Dellia Nims DNP   REFERRING PROVIDER: Patricia Nettle, NP  REFERRING DIAG: M79.601 (ICD-10-CM) - Pain in right arm  THERAPY DIAG:  Radiculopathy, cervical region  Muscle weakness (generalized)  Rationale for Evaluation and Treatment: Rehabilitation  ONSET DATE: 09/20/2020  SUBJECTIVE:                                                                                                                                                                                                         SUBJECTIVE STATEMENT: Patient states neurologist was fine with traction but cardiologist was not fine with it and didn't recommend it. He has some PVC. He has paralyzed diaphragm so he can't lay flat. He uses bipap at night. He has been doing HEP with intermittent relief in symptoms. Notes R sided radicular symptoms with extension, ipsilateral rotation and side bending.   Patient presents to therapy with complaint of RT arm weakness and pain. It all started after a neck surgery about 3 years ago. His LT arm went numb but he waited to see MD about it. They called it nerve damage and said there wasn't much to do. More recently, about 3 months ago,  his RT arm started bothering him as far as pain in elbow into axilla and also  weakness. He feels what he needs is to stretch his neck out to give space to the nerves. He has not had previous therapy for these issues.   PERTINENT HISTORY:  Cervical fusion 2021, 3 x open heart surgery, A-fib, Hx of MI, LT TKA, Portion of diaphragm paralyzed (patient report)   PAIN:  Are you having pain? Yes: NPRS scale: 4/10 Pain location: Rt elbow and arm  Pain description: aching, numb, tingling  Aggravating factors: turning head, bending/ lifting, shaving  Relieving factors: beer  PRECAUTIONS: Other: 20lb lifting restriction   WEIGHT BEARING RESTRICTIONS: No  FALLS:  Has patient fallen in last 6 months? No  OCCUPATION: Retired Clinical biochemist   PLOF: Independent  PATIENT GOALS: Get stretched out   NEXT MD VISIT: 02/19/23  OBJECTIVE:   DIAGNOSTIC FINDINGS:  NA  PATIENT SURVEYS:    COGNITION: Overall cognitive status: Within functional limits for tasks assessed  SENSATION: Decreased sensation to light touch about RUE   POSTURE: rounded shoulders and forward head  CERVICAL ROM:   Active ROM A/PROM (deg) eval  Flexion 42  Extension 29  Right lateral flexion   Left lateral flexion   Right rotation 45  Left rotation 47   (Blank rows = not tested)  UPPER EXTREMITY ROM:  Active ROM Right eval Left eval  Shoulder flexion 90 85  Shoulder extension    Shoulder abduction    Shoulder adduction    Shoulder extension    Shoulder internal rotation    Shoulder external rotation    Elbow flexion    Elbow extension    Wrist flexion    Wrist extension    Wrist ulnar deviation    Wrist radial deviation    Wrist pronation    Wrist supination     (Blank rows = not tested)  UPPER EXTREMITY MMT:  MMT Right eval Left eval  Shoulder flexion 4- 4-  Shoulder extension    Shoulder abduction    Shoulder adduction    Shoulder extension 4- 4-  Shoulder internal rotation 4 4  Shoulder external rotation    Middle trapezius    Lower trapezius    Elbow flexion     Elbow extension    Wrist flexion    Wrist extension    Wrist ulnar deviation    Wrist radial deviation    Wrist pronation    Wrist supination    Grip strength     (Blank rows = not tested)   TODAY'S TREATMENT:                                                                                                                              DATE:  12/08/22 Chin tuck 2x 10 x 5" Standing cervical retraction isometric 2 x 5 second holds Manual inferior R 1st rib glides grade II-III 5 x 1 minute Band rows  RTB 2 x 15  Unilateral ER R shoulder 1x 10   12/03/22 Chin tuck 10 x 5" Scap retraction 10 x 5"  Band rows RTB 2 x 10  Radial nerve glides seated 2 x 10   11/26/22 Performed and checked for R ULTT Passive R median nerve glide x 1' Self R median nerve floss x 1' Supine cervical chin tucks x 3" x 5 Self upper trapezius stretch x 30" x 2 Shoulder rolls forward/backwards x 10 on each Scapular retraction x 5" Postural correction x 10" in sitting  11/18/22 Eval    PATIENT EDUCATION:  Education details: updated HEP Person educated: Patient Education method: Consulting civil engineer, Demonstration, Corporate treasurer cues, Verbal cues, and Handouts Education comprehension: needs further education  HOME EXERCISE PROGRAM: Access Code: [email or web address] URL: [email or web address] 12/08/22 - Standing Shoulder Row with Anchored Resistance  - 1 x daily - 7 x weekly - 2 sets - 10 reps  11/26/22 Access Code: [email or web address] URL: [email or web address] Date: 11/26/2022 Prepared by: Rexene Alberts  Exercises - Correct Seated Posture  - 2-3 x daily - 7 x weekly - 1 sets - 10 reps - 5 second hold - Seated Scapular Retraction  - 2-3 x daily - 7 x weekly - 1-2 sets - 10 reps - 5 second hold - Median Nerve Flossing - Tray  - 1-2 x daily - 7 x weekly - Seated Upper Trapezius Stretch  - 1-2 x daily - 7 x weekly - 2 reps - 30 hold  Date: 11/18/2022 Prepared by: Josue Hector  Exercises - Correct  Seated Posture  - 2-3 x daily - 7 x weekly - 1 sets - 10 reps - 5 second hold - Seated Scapular Retraction  - 2-3 x daily - 7 x weekly - 1-2 sets - 10 reps - 5 second hold - Seated Cervical Retraction  - 2-3 x daily - 7 x weekly - 1-2 sets - 10 reps - 5 second hold  ASSESSMENT:  CLINICAL IMPRESSION: Patient interested in cervical traction but was told by cardiologist he didn't recommend it due to having to lay supine with cardiac and pulmonary issues. Showed patient seated portable self traction cuffs  as possibility if he wishes to pursue. C/o increase in radicular symptoms with chin tucks, and attempted retractions isometrics at wall but patient unable to tolerate. Patient tends to overactive shoulder and periscapular muscle as well as breaths with accessory musculature. Patient TTP and elevated R first rib with concordant symptoms. Educated on anatomy of rib/c/sp and possible thoracic outlet issues. Completed 1st rib mobs with noted decrease in tissue tension. Decrease in symptoms following. Continued with postural strengthening. Frequent cueing for posture/ shoulder positioning and mechanics. Patient will continue to benefit from physical therapy in order to improve function and reduce impairment.   OBJECTIVE IMPAIRMENTS: decreased activity tolerance, decreased mobility, decreased ROM, decreased strength, hypomobility, impaired flexibility, impaired UE functional use, improper body mechanics, postural dysfunction, and pain.   ACTIVITY LIMITATIONS: carrying, lifting, bending, sleeping, bathing, toileting, dressing, reach over head, hygiene/grooming, and caring for others  PARTICIPATION LIMITATIONS: meal prep, cleaning, laundry, driving, shopping, community activity, and yard work  PERSONAL FACTORS: Past/current experiences, Time since onset of injury/illness/exacerbation, and 3+ comorbidities: See above  are also affecting patient's functional outcome.   REHAB POTENTIAL: Fair See above    CLINICAL DECISION MAKING: Stable/uncomplicated  EVALUATION COMPLEXITY: Low   GOALS: SHORT TERM GOALS: Target date: 12/30/2022  Patient will be independent with initial HEP and self-management strategies to improve functional outcomes Baseline:  Goal status: INITIAL   LONG TERM GOALS: Target date: 12/30/2022  Patient will be independent with advanced HEP and self-management strategies to improve functional outcomes Baseline:  Goal status: INITIAL  2.  Patient will improve tested UE MMT to at least 4/5 throughout for improved ability to perform [facility] ADLs. Baseline: See MMT  Goal status: INITIAL  3.  Patient will demo improved bilat cervical rotation by 10 degrees in order to improve ability to scan environment for safety and while driving. Baseline: See AROM Goal status: INITIAL  4. Patient will report a decrease in neck pain to 0/10 at rest for improved quality of life and ability to perform UE ADLs  Baseline: 3/10 Goal status: INITIAL   5. Patient will improve bilateral shoulder flexion to at least 125 degrees for improved ability to perform [place] ADLs.  Baseline: See AROM  Goal status: INITIAL   PLAN:  PT FREQUENCY: 1-2x/week  PT DURATION: 6 weeks  PLANNED INTERVENTIONS: Therapeutic exercises, Therapeutic activity, Neuromuscular re-education, Balance training, Gait training, Patient/Family education, Joint manipulation, Joint mobilization, Stair training, Aquatic Therapy, Dry Needling, Electrical stimulation, Spinal manipulation, Spinal mobilization, Cryotherapy, Moist heat, scar mobilization, Taping, Traction, Ultrasound, Biofeedback, Ionotophoresis '4mg'$ /ml Dexamethasone, and Manual therapy.   PLAN FOR NEXT SESSION: Continue POC and may progress as tolerated with emphasis on centralization of pain, cervical strengthening, flexibility, and correction of posture. Continue with R first rib mobs, trial thoracic extension exercises, postural strengthening  1:43 PM,  12/08/22 Mearl Latin PT, DPT Physical Therapist at [facility]

## 2022-12-15 ENCOUNTER — Encounter (HOSPITAL_COMMUNITY): Payer: Medicare Other | Admitting: Physical Therapy

## 2022-12-16 ENCOUNTER — Telehealth: Payer: Self-pay | Admitting: Cardiology

## 2022-12-16 NOTE — Telephone Encounter (Signed)
Patient would like a call back from [facility] He says he was told to call back if he had issues. Pt would not provide further details.

## 2022-12-16 NOTE — Telephone Encounter (Signed)
Says he called to see if he could get a sooner appointment with Dr. Myles Gip and was given one for tomorrow at 11:15 am [place]. Says that he wanted to speak with Benjamine Mola to get further explanation of his heart monitor he wore. Says she did explain at last visit (12/07/22) but he wanted to discuss results again. Advised that he could discuss with Me[place]or at visit tomorrow. Advised if he develops worsening symptoms between now and his appointment tomorrow, to go to the ED for an evaluation. Verbalized understanding.

## 2022-12-17 ENCOUNTER — Emergency Department (HOSPITAL_COMMUNITY): Payer: Medicare Other

## 2022-12-17 ENCOUNTER — Emergency Department (HOSPITAL_COMMUNITY)
Admission: EM | Admit: 2022-12-17 | Discharge: 2022-12-17 | Disposition: A | Discharge status: Home / Self Care | Payer: Medicare Other | Attending: Emergency Medicine | Admitting: Emergency Medicine

## 2022-12-17 ENCOUNTER — Ambulatory Visit (INDEPENDENT_AMBULATORY_CARE_PROVIDER_SITE_OTHER): Payer: Medicare Other | Admitting: Cardiovascular Disease

## 2022-12-17 ENCOUNTER — Encounter: Payer: Self-pay | Admitting: Cardiovascular Disease

## 2022-12-17 ENCOUNTER — Other Ambulatory Visit: Payer: Self-pay

## 2022-12-17 VITALS — BP 170/94 | HR 56 | Ht 66.0 in | Wt 237.0 lb

## 2022-12-17 DIAGNOSIS — Z7901 Long term (current) use of anticoagulants: Secondary | ICD-10-CM | POA: Diagnosis not present

## 2022-12-17 DIAGNOSIS — R0602 Shortness of breath: Secondary | ICD-10-CM | POA: Insufficient documentation

## 2022-12-17 DIAGNOSIS — R079 Chest pain, unspecified: Secondary | ICD-10-CM | POA: Diagnosis present

## 2022-12-17 DIAGNOSIS — I483 Typical atrial flutter: Secondary | ICD-10-CM | POA: Diagnosis not present

## 2022-12-17 DIAGNOSIS — D72829 Elevated white blood cell count, unspecified: Secondary | ICD-10-CM | POA: Diagnosis not present

## 2022-12-17 DIAGNOSIS — R0600 Dyspnea, unspecified: Secondary | ICD-10-CM | POA: Insufficient documentation

## 2022-12-17 DIAGNOSIS — R0609 Other forms of dyspnea: Secondary | ICD-10-CM

## 2022-12-17 DIAGNOSIS — I10 Essential (primary) hypertension: Secondary | ICD-10-CM | POA: Diagnosis not present

## 2022-12-17 DIAGNOSIS — Z79899 Other long term (current) drug therapy: Secondary | ICD-10-CM | POA: Insufficient documentation

## 2022-12-17 DIAGNOSIS — I4891 Unspecified atrial fibrillation: Secondary | ICD-10-CM

## 2022-12-17 LAB — BASIC METABOLIC PANEL
Anion gap: 8 (ref 5–15)
BUN: 10 mg/dL (ref 8–23)
CO2: 27 mmol/L (ref 22–32)
Calcium: 9.3 mg/dL (ref 8.9–10.3)
Chloride: 108 mmol/L (ref 98–111)
Creatinine, Ser: 1.1 mg/dL (ref 0.61–1.24)
GFR, Estimated: >60 mL/min (ref >60)
Glucose, Bld: 94 mg/dL (ref 70–99)
Potassium: 3.5 mmol/L (ref 3.5–5.1)
Sodium: 143 mmol/L (ref 135–145)

## 2022-12-17 LAB — CBC
HCT: 39.1 % (ref 39.0–52.0)
Hemoglobin: 12.9 g/dL — ABNORMAL LOW (ref 13.0–17.0)
MCH: 27.4 pg (ref 26.0–34.0)
MCHC: 33 g/dL (ref 30.0–36.0)
MCV: 83 fL (ref 80.0–100.0)
Platelets: 332 10*3/uL (ref 150–400)
RBC: 4.71 MIL/uL (ref 4.22–5.81)
RDW: 15.4 % (ref 11.5–15.5)
WBC: 12.9 10*3/uL — ABNORMAL HIGH (ref 4.0–10.5)
nRBC: 0 % (ref 0.0–0.2)

## 2022-12-17 LAB — TROPONIN I (HIGH SENSITIVITY)
Troponin I (High Sensitivity): 27 ng/L — ABNORMAL HIGH (ref <18)
Troponin I (High Sensitivity): 26 ng/L — ABNORMAL HIGH (ref <18)

## 2022-12-17 LAB — BRAIN NATRIURETIC PEPTIDE: B Natriuretic Peptide: 265.7 pg/mL — ABNORMAL HIGH (ref 0.0–100.0)

## 2022-12-17 NOTE — Discharge Instructions (Signed)
Your testing has not shown any signs of heart attack.  All of your findings including lab work, x-ray and vital signs have been reassuring.  I want you to see Dr. Harl Bowie this week and your pulmonologist or your lung specialist this week as well.  Both of their phone numbers are attached above.  ER for worsening symptoms

## 2022-12-17 NOTE — ED Provider Triage Note (Signed)
Emergency Medicine Provider Triage Evaluation Note  [name] [name] , a 72 y.o. male  was evaluated in triage.  Patient complains of 2 to 3 days of intermittent chest pain.  Says that it feels more like tightness.  Also says that he has somewhat short of breath but that this has been going on a long time since he had some paralysis of his diaphragm.  Has a very mild cough but says that this is also somewhat chronic.  No history of DVT/PE, tobacco use, recent travel or surgery.  No swelling in either of his legs or history of heart failure.  Says that he is not always in A-fib.  Review of Systems  Positive:  Negative:   Physical Exam  BP (!) 164/87 (BP Location: Right Arm)   Pulse (!) 58   Temp 98.2 F (36.8 C) (Oral)   Resp 20   SpO2 98%  Gen:   Awake, no distress   Resp:  Normal effort  MSK:   Moves extremities without difficulty  Other:    Medical Decision Making  Medically screening exam initiated at 12:34 PM.  Appropriate orders placed.  [name] [name] was informed that the remainder of the evaluation will be completed by another provider, this initial triage assessment does not replace that evaluation, and the importance of remaining in the ED until their evaluation is complete.     Rhae Hammock, PA-C 12/17/22 1235

## 2022-12-17 NOTE — ED Notes (Signed)
E-signature pad not working. Pt gives verbal consent.

## 2022-12-17 NOTE — ED Notes (Signed)
Patient verbalizes understanding of discharge instructions. Opportunity for questioning and answers were provided. Pt discharged from ED. 

## 2022-12-17 NOTE — ED Triage Notes (Signed)
Pt to ED from heart doctor c/o chest pain x 2 days, intermittent in nature, also reports [facility], reports this is chronic in nature, but feels worse this past couple of days.

## 2022-12-17 NOTE — ED Provider Notes (Signed)
[facility] Provider Note   CSN: [id number] Arrival date & time: 12/17/22  1222     History  Chief Complaint  Patient presents with   Chest Pain   Shortness of Breath    [name] [name] is a 72 y.o. male.   Chest Pain Associated symptoms: shortness of breath   Shortness of Breath Associated symptoms: chest pain      This patient is a 72 year old male presenting to the hospital with a complaint of chest discomfort and shortness of breath.  The shortness of breath has been gradually getting worse over the last several years since being diagnosed with diaphragmatic paralysis, this is something that occurred after having spinal surgery or potentially during COVID though nobody can really identify when it was.  He has had a prior bypass graft many years ago, he has had chest pain for the last several days which has been constant and a heaviness in his chest and not associated with coughing or swelling of his legs anymore than usual.  He is currently anticoagulated on Eliquis because of his atrial fibrillation.  He was at the atrial fibrillation clinic today talking to his electrophysiologist and was referred here for a troponin to make sure that it was negative given his chest pain.  They are fairly confident that if he has negative troponins that this is not related to coronary disease.  Home Medications Prior to Admission medications   Medication Sig Start Date End Date Taking? Authorizing Provider  acetaminophen (TYLENOL) 325 MG tablet Take 2 tablets (650 mg total) by mouth every 4 (four) hours as needed for mild pain ((score 1 to 3) or temp > 100.5). 10/16/19   Angiulli, Lavon Paganini, PA-C  albuterol (VENTOLIN HFA) 108 (90 Base) MCG/ACT inhaler Inhale 2 puffs into the lungs every 6 (six) hours as needed. 03/19/22   [provider]  atorvastatin (LIPITOR) 20 MG tablet Take 1 tablet (20 mg total) by mouth daily. 12/07/22   Finis Bud, NP   Coenzyme Q10 (CO Q 10 PO) Take 100 mg by mouth daily.    [provider]  diltiazem (CARDIZEM CD) 180 MG 24 hr capsule TAKE 1 CAPSULE BY MOUTH EVERY DAY 05/14/22   Arnoldo Lenis, MD  ELIQUIS 5 MG TABS tablet TAKE 1 TABLET BY MOUTH TWICE A DAY 09/07/22   Arnoldo Lenis, MD  ezetimibe (ZETIA) 10 MG tablet Take 1 tablet (10 mg total) by mouth daily. 12/07/22 06/05/23  Finis Bud, NP  furosemide (LASIX) 20 MG tablet TAKE 2 TABLETS BY MOUTH EVERY DAY 05/14/22   Arnoldo Lenis, MD  hydrALAZINE (APRESOLINE) 100 MG tablet TAKE 1 TABLET BY MOUTH 3 TIMES DAILY. 10/12/22   Arnoldo Lenis, MD  irbesartan (AVAPRO) 300 MG tablet TAKE 1 TABLET BY MOUTH EVERY DAY 12/03/22   Arnoldo Lenis, MD  LORazepam (ATIVAN) 0.5 MG tablet Take 0.5 mg by mouth every 8 (eight) hours.    [provider]  meclizine (ANTIVERT) 25 MG tablet Take 25 mg by mouth 3 (three) times daily as needed. 04/13/22   [provider]  metoprolol tartrate (LOPRESSOR) 25 MG tablet Take 1 tablet (25 mg total) by mouth 2 (two) times daily. May take an additional tablet once a day if heart rate is greater than 110 12/07/22   Finis Bud, NP  Multiple Vitamin (MULTIVITAMIN) tablet Take 1 tablet by mouth daily.    [provider]  nitroGLYCERIN (NITROSTAT) 0.4 MG  SL tablet Place 1 tablet (0.4 mg total) under the tongue every 5 (five) minutes x 3 doses as needed for chest pain. 02/07/18   Cheryln Manly, NP  omeprazole (PRILOSEC) 20 MG capsule TAKE 1 CAPSULE BY MOUTH EVERY OTHER DAY 03/10/22   Arnoldo Lenis, MD  PRESCRIPTION MEDICATION Pt uses BiPAP machine at bedtime    [provider]  sertraline (ZOLOFT) 100 MG tablet Take 100 mg by mouth daily. 11/18/20   [provider]      Allergies    Vancomycin, Aldactone [spironolactone], Chlorthalidone, Nsaids, and Coreg [carvedilol]    Review of Systems   Review of Systems  Respiratory:  Positive for shortness of breath.    Cardiovascular:  Positive for chest pain.  All other systems reviewed and are negative.   Physical Exam Updated Vital Signs BP (!) 164/87 (BP Location: Right Arm)   Pulse (!) 58   Temp 98.2 F (36.8 C) (Oral)   Resp 20   SpO2 98%  Physical Exam Vitals and nursing note reviewed.  Constitutional:      General: He is not in acute distress.    Appearance: He is well-developed.  HENT:     Head: Normocephalic and atraumatic.     Mouth/Throat:     Pharynx: No oropharyngeal exudate.  Eyes:     General: No scleral icterus.       Right eye: No discharge.        Left eye: No discharge.     Conjunctiva/sclera: Conjunctivae normal.     Pupils: Pupils are equal, round, and reactive to light.  Neck:     Thyroid: No thyromegaly.     Vascular: No JVD.  Cardiovascular:     Rate and Rhythm: Normal rate and regular rhythm.     Heart sounds: Normal heart sounds. No murmur heard.    No friction rub. No gallop.  Pulmonary:     Effort: Pulmonary effort is normal. No respiratory distress.     Breath sounds: Normal breath sounds. No wheezing or rales.  Abdominal:     General: Bowel sounds are normal. There is no distension.     Palpations: Abdomen is soft. There is no mass.     Tenderness: There is no abdominal tenderness.  Musculoskeletal:        General: No tenderness. Normal range of motion.     Cervical back: Normal range of motion and neck supple.     Right lower leg: Edema present.     Left lower leg: Edema present.  Lymphadenopathy:     Cervical: No cervical adenopathy.  Skin:    General: Skin is warm and dry.     Findings: No erythema or rash.  Neurological:     Mental Status: He is alert.     Coordination: Coordination normal.  Psychiatric:        Behavior: Behavior normal.     ED Results / Procedures / Treatments   Labs (all labs ordered are listed, but only abnormal results are displayed) Labs Reviewed  CBC - Abnormal; Notable for the following components:       Result Value   WBC 12.9 (*)    Hemoglobin 12.9 (*)    All other components within normal limits  BRAIN NATRIURETIC PEPTIDE - Abnormal; Notable for the following components:   B Natriuretic Peptide 265.7 (*)    All other components within normal limits  TROPONIN I (HIGH SENSITIVITY) - Abnormal; Notable for the following components:  Troponin I (High Sensitivity) 27 (*)    All other components within normal limits  TROPONIN I (HIGH SENSITIVITY) - Abnormal; Notable for the following components:   Troponin I (High Sensitivity) 26 (*)    All other components within normal limits  BASIC METABOLIC PANEL    EKG EKG Interpretation  Date/Time:  Thursday December 17 2022 12:11:59 EDT Ventricular Rate:  53 PR Interval:  152 QRS Duration: 98 QT Interval:  480 QTC Calculation: 450 R Axis:   5 Text Interpretation: Sinus bradycardia Incomplete right bundle branch block Left ventricular hypertrophy with repolarization abnormality ( R in aVL ) Abnormal ECG When compared with ECG of 20-Feb-2021 14:48, PREVIOUS ECG IS PRESENT since last tracing no significant change c/w 5/22 Confirmed by Noemi Chapel [phone]) on 12/17/2022 4:34:01 PM  Radiology DG Chest 2 View  Result Date: 12/17/2022 CLINICAL DATA:  cp EXAM: CHEST - 2 VIEW COMPARISON:  None Available. FINDINGS: The heart size and mediastinal contours are unchanged. CABG and median sternotomy. Similar mild linear atelectasis/scar at the left lung base. Otherwise, both lungs are clear. The visualized skeletal structures are unremarkable. IMPRESSION: No active cardiopulmonary disease. Electronically Signed   By: Margaretha Sheffield M.D.   On: 12/17/2022 13:13    Procedures Procedures    Medications Ordered in ED Medications - No data to display  ED Course/ Medical Decision Making/ A&P                             Medical Decision Making  The patient does appear mildly dyspneic when he talks, when he lays down and gets worse.  He states this is  chronic secondary to his diaphragmatic paralysis.  He does not appear in distress, his labs and EKG and chest x-ray are unremarkable in fact his EKG is unchanged from May 2022.  He states that he has had rather chronic chest pain ever since having a bypass graft and this feels essentially no different though it seems to be a little more intense.  It is not exertional and it is not associated with fevers.  Labs: I personally viewed and interpreted labs which show a negative troponin, there is been no delta rise.  The rest of the labs are unremarkable, BNP of 265 not consistent with acute heart failure, metabolic panel unremarkable, CBC with mild leukocytosis of 12,000, nonspecific.  Imaging: I personally viewed and interpreted the chest x-ray which shows no acute findings, I agree with the radiologist interpretation, no pneumonia or pneumothorax  Cardiac monitoring: Mild sinus bradycardia at 58 bpm, EKG without acute ischemia  Medication management: No changes in the patient's medicines, I reviewed the patient's medicines, stable for discharge  Cardiology has already seen the patient in the office earlier today and felt comfortable sending the patient home.  I agree with their assessment and feel like this patient is low risk for acute coronary syndrome and can follow-up in the outpatient setting.  I also referred him to pulmonology, he already sees a pulmonologist for his diaphragm issues.  I reviewed the patient's vital signs prior to discharge, mild hypertension, no hypoxia, stable for discharge at this time.  He understands indications for return.        Final Clinical Impression(s) / ED Diagnoses Final diagnoses:  Chest pain, unspecified type  Dyspnea on exertion    Rx / DC Orders ED Discharge Orders     None         Sabra Heck,  Aaron Edelman, MD 12/17/22 [phone]

## 2022-12-17 NOTE — Patient Instructions (Signed)
Medication Instructions:  Your physician recommends that you continue on your current medications as directed. Please refer to the Current Medication list given to you today.  *If you need a refill on your cardiac medications before your next appointment, please call your pharmacy*  Lab Work: None ordered  Testing/Procedures: None ordered  Follow-Up: As needed 

## 2022-12-17 NOTE — Progress Notes (Signed)
PCP: Tempie Hoist, FNP Primary Cardiologist: Dr Harl Bowie Primary EP: Dr Myles Gip  [name] [name] is a 72 y.o. male who presents today for routine electrophysiology followup.    He had an atrial fibrillation by Dr. Rayann Heman in 2017 and a redo in 2019.  Since last being seen in our clinic, the patient reports doing reasonably well. He wore a 7-day monitor that showed10 runs of SVT, the longest 19 beats  He has chronic SOB.  He is not very active.   + mild edema.  No afib. Today, he denies symptoms of palpitations,  dizziness, presyncope, or syncope.  His primary complaint is chest pain that started several days ago. It is a dull, substernal pressure. There are no exacerbating or alleviating factors. He is short of breath, but this is a chronic issue.  Past Medical History:  Diagnosis Date   Anxiety    CHF (congestive heart failure) ([facility])    h/o due to fluid overload in recovery room   Coronary artery disease    2v CABG, 2004 ([facility])   COVID-19    DJD (degenerative joint disease)    Dyspnea    on exertion   GERD (gastroesophageal reflux disease)    History of kidney stones    3 stones yrs. ago   HLD (hyperlipidemia)    Hypertension    MI, old 2004   OA (osteoarthritis)    Obesity    Persistent atrial fibrillation (HCC)    failed medical therapy with sotalol, s/p PVI x 2   Sleep apnea    on CPAP   Spinal stenosis    Typical atrial flutter [facility])    Past Surgical History:  Procedure Laterality Date   ANTERIOR CERVICAL DECOMP/DISCECTOMY FUSION [name] 09/21/2019   Procedure: ANTERIOR CERVICAL DECOMPRESSION/DISCECTOMY FUSION CERVICAL THREE- CERVICAL FOUR, CERVICAL FOUR- CERVICAL FIVE, CERVICAL FIVE- CERVICAL SIX;  Surgeon: Newman Pies, MD;  Location: [facility];  Service: Neurosurgery;  Laterality: [name];  ANTERIOR CERVICAL DECOMPRESSION/DISCECTOMY FUSION CERVICAL THREE- CERVICAL FOUR, CERVICAL FOUR- CERVICAL FIVE, CERVICAL FIVE- CERVICAL SIX   ATRIAL FIBRILLATION ABLATION [name] 05/24/2018    Procedure: ATRIAL FIBRILLATION ABLATION;  Surgeon: Thompson Grayer, MD;  Location: [facility] CV LAB;  Service: Cardiovascular;  Laterality: [name];   BACK SURGERY  2011   CARPAL TUNNEL RELEASE     bilateral   CHOLECYSTECTOMY     CORONARY ARTERY BYPASS GRAFT     2004   ELECTROPHYSIOLOGIC STUDY [name] 09/15/2016   Procedure: Atrial Fibrillation Ablation;  Surgeon: Thompson Grayer, MD;  Location: [facility] CV LAB;  Service: Cardiovascular;  Laterality: [name];   fistula surgery     hemorrhoidectomy     LEFT HEART CATH AND CORS/GRAFTS ANGIOGRAPHY [name] 02/07/2018   Procedure: LEFT HEART CATH AND CORS/GRAFTS ANGIOGRAPHY;  Surgeon: Martinique, Peter M, MD;  Location: [facility] CV LAB;  Service: Cardiovascular;  Laterality: [name];   LUMBAR LAMINECTOMY     right foot fracture     TEE WITHOUT CARDIOVERSION [name] 09/15/2016   Procedure: TRANSESOPHAGEAL ECHOCARDIOGRAM (TEE);  Surgeon: Lelon Perla, MD;  Location: [facility] ENDOSCOPY;  Service: Cardiovascular;  Laterality: [name];   TOTAL KNEE ARTHROPLASTY Left 07/07/2017   Procedure: LEFT TOTAL KNEE ARTHROPLASTY;  Surgeon: Latanya Maudlin, MD;  Location: [facility];  Service: Orthopedics;  Laterality: Left;   TRANSTHORACIC ECHOCARDIOGRAM  08/25/2010   EF 55-60%    ROS- all systems are reviewed and negatives except as per HPI above  Current Outpatient Medications  Medication Sig Dispense Refill   acetaminophen (  TYLENOL) 325 MG tablet Take 2 tablets (650 mg total) by mouth every 4 (four) hours as needed for mild pain ((score 1 to 3) or temp > 100.5).     albuterol (VENTOLIN HFA) 108 (90 Base) MCG/ACT inhaler Inhale 2 puffs into the lungs every 6 (six) hours as needed.     atorvastatin (LIPITOR) 20 MG tablet Take 1 tablet (20 mg total) by mouth daily. 90 tablet 1   Coenzyme Q10 (CO Q 10 PO) Take 100 mg by mouth daily.     diltiazem (CARDIZEM CD) 180 MG 24 hr capsule TAKE 1 CAPSULE BY MOUTH EVERY DAY 90 capsule 3   ELIQUIS 5 MG TABS tablet TAKE 1 TABLET BY MOUTH TWICE A DAY 180  tablet 1   ezetimibe (ZETIA) 10 MG tablet Take 1 tablet (10 mg total) by mouth daily. 90 tablet 1   furosemide (LASIX) 20 MG tablet TAKE 2 TABLETS BY MOUTH EVERY DAY 180 tablet 2   hydrALAZINE (APRESOLINE) 100 MG tablet TAKE 1 TABLET BY MOUTH 3 TIMES DAILY. 270 tablet 3   irbesartan (AVAPRO) 300 MG tablet TAKE 1 TABLET BY MOUTH EVERY DAY 90 tablet 1   LORazepam (ATIVAN) 0.5 MG tablet Take 0.5 mg by mouth every 8 (eight) hours.     meclizine (ANTIVERT) 25 MG tablet Take 25 mg by mouth 3 (three) times daily as needed.     metoprolol tartrate (LOPRESSOR) 25 MG tablet Take 1 tablet (25 mg total) by mouth 2 (two) times daily. May take an additional tablet once a day if heart rate is greater than 110 180 tablet 1   Multiple Vitamin (MULTIVITAMIN) tablet Take 1 tablet by mouth daily.     nitroGLYCERIN (NITROSTAT) 0.4 MG SL tablet Place 1 tablet (0.4 mg total) under the tongue every 5 (five) minutes x 3 doses as needed for chest pain. 25 tablet 1   omeprazole (PRILOSEC) 20 MG capsule TAKE 1 CAPSULE BY MOUTH EVERY OTHER DAY 45 capsule 3   PRESCRIPTION MEDICATION Pt uses BiPAP machine at bedtime     sertraline (ZOLOFT) 100 MG tablet Take 100 mg by mouth daily.     No current facility-administered medications for this visit.    Physical Exam: Vitals:   12/17/22 1125  BP: (!) 170/94  Pulse: (!) 56  SpO2: 96%  Weight: 237 lb (107.5 kg)  Height: '5\' 6"'$  (1.676 m)     Gen: Appears comfortable, well-nourished CV: RRR, no dependent edema The device site is normal -- no tenderness, edema, drainage, redness, threatened erosion. Pulm: breathing easily   Wt Readings from Last 3 Encounters:  12/17/22 237 lb (107.5 kg)  12/07/22 238 lb 9.6 oz (108.2 kg)  11/06/22 230 lb 3.2 oz (104.4 kg)    EKG tracing ordered today is personally reviewed and shows sinus bradycardia  Assessment and Plan:  Persistent atrial fibrillation/ atrial flutter Well controlled post ablation off AAD therapy. He has  occasional atrial runs, but otherwise maintaining sinus rhythm. I do not think the asymptomatic ectopy warrants intervention.  Chads2vasc score is 3.  He is on eliquis  2. CAD s/p CABG He has chest pain today with typical and atypical features. ECG today does not suggest active ischemia, but ischemia cannot be excluded. I suggested that he go to the ER for rule-out. Given the duration of his symptoms, a negative hsTnI would exclude ACS.  3. HTN Stable No change required today 4. OSA Compliant with CPAP  5. Overweight Body mass index is 38.25 kg/m.  He has been diligently working on this  6. 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  EP to see as needed  Melida Quitter, MD 12/17/2022 12:06 PM

## 2022-12-22 ENCOUNTER — Ambulatory Visit (HOSPITAL_COMMUNITY): Payer: Medicare Other

## 2022-12-22 DIAGNOSIS — M5412 Radiculopathy, cervical region: Secondary | ICD-10-CM

## 2022-12-22 DIAGNOSIS — M6281 Muscle weakness (generalized): Secondary | ICD-10-CM

## 2022-12-22 DIAGNOSIS — R079 Chest pain, unspecified: Secondary | ICD-10-CM | POA: Diagnosis not present

## 2022-12-22 NOTE — Therapy (Signed)
OUTPATIENT PHYSICAL THERAPY CERVICAL TREATMENT   Patient Name: [name] [name] MRN: [id number] DOB:02/20/1951, 72 y.o., male Today's Date: 12/22/2022  END OF SESSION:  PT End of Session - 12/22/22 1239     Visit Number 5    Number of Visits 12    Date for PT Re-Evaluation 12/30/22    Authorization Type Medicare A and B    Progress Note Due on Visit 10    PT Start Time 1120    PT Stop Time 1200    PT Time Calculation (min) 40 min    Activity Tolerance Patient tolerated treatment well    Behavior During Therapy WFL for tasks assessed/performed            Past Medical History:  Diagnosis Date   Anxiety    CHF (congestive heart failure) ([place])    h/o due to fluid overload in recovery room   Coronary artery disease    2v CABG, 2004 ([facility])   COVID-19    DJD (degenerative joint disease)    Dyspnea    on exertion   GERD (gastroesophageal reflux disease)    History of kidney stones    3 stones yrs. ago   HLD (hyperlipidemia)    Hypertension    MI, old 2004   OA (osteoarthritis)    Obesity    Persistent atrial fibrillation (HCC)    failed medical therapy with sotalol, s/p PVI x 2   Sleep apnea    on CPAP   Spinal stenosis    Typical atrial flutter [facility])    Past Surgical History:  Procedure Laterality Date   ANTERIOR CERVICAL DECOMP/DISCECTOMY FUSION [name] 09/21/2019   Procedure: ANTERIOR CERVICAL DECOMPRESSION/DISCECTOMY FUSION CERVICAL THREE- CERVICAL FOUR, CERVICAL FOUR- CERVICAL FIVE, CERVICAL FIVE- CERVICAL SIX;  Surgeon: Newman Pies, MD;  Location: [facility];  Service: Neurosurgery;  Laterality: [name];  ANTERIOR CERVICAL DECOMPRESSION/DISCECTOMY FUSION CERVICAL THREE- CERVICAL FOUR, CERVICAL FOUR- CERVICAL FIVE, CERVICAL FIVE- CERVICAL SIX   ATRIAL FIBRILLATION ABLATION [name] 05/24/2018   Procedure: ATRIAL FIBRILLATION ABLATION;  Surgeon: Thompson Grayer, MD;  Location: [place] [facility];  Service: Cardiovascular;  Laterality: [name];   BACK SURGERY  2011   CARPAL TUNNEL  RELEASE     bilateral   CHOLECYSTECTOMY     CORONARY ARTERY BYPASS GRAFT     2004   ELECTROPHYSIOLOGIC STUDY [name] 09/15/2016   Procedure: Atrial Fibrillation Ablation;  Surgeon: Thompson Grayer, MD;  Location: [place] [facility];  Service: Cardiovascular;  Laterality: [name];   fistula surgery     hemorrhoidectomy     LEFT HEART CATH AND CORS/GRAFTS ANGIOGRAPHY [name] 02/07/2018   Procedure: LEFT HEART CATH AND CORS/GRAFTS ANGIOGRAPHY;  Surgeon: Martinique, Peter M, MD;  Location: [place] [facility];  Service: Cardiovascular;  Laterality: [name];   LUMBAR LAMINECTOMY     right foot fracture     TEE WITHOUT CARDIOVERSION [name] 09/15/2016   Procedure: TRANSESOPHAGEAL ECHOCARDIOGRAM (TEE);  Surgeon: Lelon Perla, MD;  Location: [facility];  Service: Cardiovascular;  Laterality: [name];   TOTAL KNEE ARTHROPLASTY Left 07/07/2017   Procedure: LEFT TOTAL KNEE ARTHROPLASTY;  Surgeon: Latanya Maudlin, MD;  Location: [facility];  Service: Orthopedics;  Laterality: Left;   TRANSTHORACIC ECHOCARDIOGRAM  08/25/2010   EF 55-60%   Patient Active Problem List   Diagnosis Date Noted   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   Cervical myelopathy ([place]) 10/03/2019   Myelopathy concurrent with and due to spinal stenosis of cervical region [facility]) 09/21/2019   Paroxysmal atrial fibrillation ([place]) 05/24/2018  Unstable angina (HCC) 02/04/2018   Typical atrial flutter (HCC)    Spinal stenosis    QT prolongation    Palpitations    PAF (paroxysmal atrial fibrillation) (HCC)    OA (osteoarthritis)    Hypertension    HLD (hyperlipidemia)    Edema    DJD (degenerative joint disease)    Coronary artery disease    Chronic chest pain    Hx of total knee replacement, 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   A-fib ([place]) 09/15/2016   Chest pain 09/09/2016   Obesity 08/22/2016   Anxiety state 08/22/2016   Encounter for monitoring sotalol therapy 08/20/2016   Morbidly obese ([place]) 07/12/2016   Medical non-compliance 07/12/2016   Elevated TSH 07/09/2016   BPH  without urinary obstruction 07/09/2016   Need for hepatitis C screening test 07/09/2016   Idiopathic gout 07/09/2016   Hyperglycemia 08/01/2015   Atrial fibrillation with RVR ([place]) 03/28/2015   Routine general medical examination at a health care facility 08/11/2013   Chronic sinus bradycardia    Obstructive sleep apnea 09/12/2010   MYOCARDIAL INFARCTION 09/11/2010   Spinal stenosis, unspecified region other than cervical 09/11/2010   Hyperlipidemia with target LDL less than 70 09/08/2010   Depression 09/08/2010   Essential hypertension 09/08/2010   Coronary atherosclerosis 09/08/2010   Congestive heart failure ([place]) 09/08/2010   GERD 09/08/2010   MI, old 10/05/2002    PCP: Dellia Nims DNP   REFERRING PROVIDER: Patricia Nettle, NP  REFERRING DIAG: M79.601 (ICD-10-CM) - Pain in right arm  THERAPY DIAG:  Radiculopathy, cervical region  Muscle weakness (generalized)  Rationale for Evaluation and Treatment: Rehabilitation  ONSET DATE: 09/20/2020  SUBJECTIVE:                                                                                                                                                                                                         SUBJECTIVE STATEMENT: Patient states neurologist was fine with traction but cardiologist was not fine with it and didn't recommend it. Current pain was at neck and R UE = 4/10. Patient states that he was at the ER last week due to chest pains. Patient states that everything was fine and was D/C on the same day.  Patient presents to therapy with complaint of RT arm weakness and pain. It all started after a neck surgery about 3 years ago. His LT arm went numb but he waited to see MD about it. They called it nerve damage and said there wasn't much to do. More recently, about 3 months ago, his RT arm started  bothering him as far as pain in elbow into axilla and also weakness. He feels what he needs is to stretch his neck out to give  space to the nerves. He has not had previous therapy for these issues.   PERTINENT HISTORY:  Cervical fusion 2021, 3 x open heart surgery, A-fib, Hx of MI, LT TKA, Portion of diaphragm paralyzed (patient report)   PAIN:  Are you having pain? Yes: NPRS scale: 4/10 Pain location: Rt elbow and arm  Pain description: aching, numb, tingling  Aggravating factors: turning head, bending/ lifting, shaving  Relieving factors: beer  PRECAUTIONS: Other: 20lb lifting restriction   WEIGHT BEARING RESTRICTIONS: No  FALLS:  Has patient fallen in last 6 months? No  OCCUPATION: Retired Clinical biochemist   PLOF: Independent  PATIENT GOALS: Get stretched out   NEXT MD VISIT: 02/19/23  OBJECTIVE:   DIAGNOSTIC FINDINGS:  NA  PATIENT SURVEYS:    COGNITION: Overall cognitive status: Within functional limits for tasks assessed  SENSATION: Decreased sensation to light touch about RUE   POSTURE: rounded shoulders and forward head  CERVICAL ROM:   Active ROM A/PROM (deg) eval  Flexion 42  Extension 29  Right lateral flexion   Left lateral flexion   Right rotation 45  Left rotation 47   (Blank rows = not tested)  UPPER EXTREMITY ROM:  Active ROM Right eval Left eval  Shoulder flexion 90 85  Shoulder extension    Shoulder abduction    Shoulder adduction    Shoulder extension    Shoulder internal rotation    Shoulder external rotation    Elbow flexion    Elbow extension    Wrist flexion    Wrist extension    Wrist ulnar deviation    Wrist radial deviation    Wrist pronation    Wrist supination     (Blank rows = not tested)  UPPER EXTREMITY MMT:  MMT Right eval Left eval  Shoulder flexion 4- 4-  Shoulder extension    Shoulder abduction    Shoulder adduction    Shoulder extension 4- 4-  Shoulder internal rotation 4 4  Shoulder external rotation    Middle trapezius    Lower trapezius    Elbow flexion    Elbow extension    Wrist flexion    Wrist extension    Wrist  ulnar deviation    Wrist radial deviation    Wrist pronation    Wrist supination    Grip strength     (Blank rows = not tested)   TODAY'S TREATMENT:                                                                                                                              DATE:  12/22/22 Seated STM on B upper trapezius x 10' Shoulder rolls ant/post x 10 x 2 each Cervical rot x 3" x 10 x 2 on each L passive Upper trapezius stretch x 30"  x 3 R active Upper trapezius stretch x 30" x 3 Mid rows x 3" x 10 RTB Unilateral ER R shoulder 2 x 10, RTB  12/08/22 Chin tuck 2x 10 x 5" Standing cervical retraction isometric 2 x 5 second holds Manual inferior R 1st rib glides grade II-III 5 x 1 minute Band rows RTB 2 x 15  Unilateral ER R shoulder 1x 10   12/03/22 Chin tuck 10 x 5" Scap retraction 10 x 5"  Band rows RTB 2 x 10  Radial nerve glides seated 2 x 10   11/26/22 Performed and checked for R ULTT Passive R median nerve glide x 1' Self R median nerve floss x 1' Supine cervical chin tucks x 3" x 5 Self upper trapezius stretch x 30" x 2 Shoulder rolls forward/backwards x 10 on each Scapular retraction x 5" Postural correction x 10" in sitting  11/18/22 Eval    PATIENT EDUCATION:  Education details: updated HEP Person educated: Patient Education method: Consulting civil engineer, Demonstration, Corporate treasurer cues, Verbal cues, and Handouts Education comprehension: needs further education  HOME EXERCISE PROGRAM: Access Code: [email or web address] URL: [email or web address] 12/08/22 - Standing Shoulder Row with Anchored Resistance  - 1 x daily - 7 x weekly - 2 sets - 10 reps  11/26/22 Access Code: [email or web address] URL: [email or web address] Date: 11/26/2022 Prepared by: Rexene Alberts  Exercises - Correct Seated Posture  - 2-3 x daily - 7 x weekly - 1 sets - 10 reps - 5 second hold - Seated Scapular Retraction  - 2-3 x daily - 7 x weekly - 1-2 sets - 10 reps - 5 second hold - Median  Nerve Flossing - Tray  - 1-2 x daily - 7 x weekly - Seated Upper Trapezius Stretch  - 1-2 x daily - 7 x weekly - 2 reps - 30 hold  Date: 11/18/2022 Prepared by: Josue Hector  Exercises - Correct Seated Posture  - 2-3 x daily - 7 x weekly - 1 sets - 10 reps - 5 second hold - Seated Scapular Retraction  - 2-3 x daily - 7 x weekly - 1-2 sets - 10 reps - 5 second hold - Seated Cervical Retraction  - 2-3 x daily - 7 x weekly - 1-2 sets - 10 reps - 5 second hold  ASSESSMENT:  CLINICAL IMPRESSION: Interventions today were geared towards flexibility and strengthening. Tolerated all activities without worsening of symptoms except when doing chin tucks so it was discontinued as it made the radicular symptoms to his R hand worse. Patient also reported slight radicular symptoms up to his R elbow so only 10 reps were performed. Demonstrated mild levels of fatigue. Reported some SOB. SpO2 at the end of the session was at 96%. Provided moderate amount of cueing to ensure correct execution of activity with fair carry-over. Tends to hike the shoulder during the stretches. To date, skilled PT is required to address the impairments and improve function.   OBJECTIVE IMPAIRMENTS: decreased activity tolerance, decreased mobility, decreased ROM, decreased strength, hypomobility, impaired flexibility, impaired UE functional use, improper body mechanics, postural dysfunction, and pain.   ACTIVITY LIMITATIONS: carrying, lifting, bending, sleeping, bathing, toileting, dressing, reach over head, hygiene/grooming, and caring for others  PARTICIPATION LIMITATIONS: meal prep, cleaning, laundry, driving, shopping, community activity, and yard work  PERSONAL FACTORS: Past/current experiences, Time since onset of injury/illness/exacerbation, and 3+ comorbidities: See above  are also affecting patient's functional outcome.   REHAB POTENTIAL: Fair See above   CLINICAL DECISION MAKING: Stable/uncomplicated  EVALUATION  COMPLEXITY: Low  GOALS: SHORT TERM GOALS: Target date: 12/30/2022  Patient will be independent with initial HEP and self-management strategies to improve functional outcomes Baseline:  Goal status: INITIAL   LONG TERM GOALS: Target date: 12/30/2022  Patient will be independent with advanced HEP and self-management strategies to improve functional outcomes Baseline:  Goal status: INITIAL  2.  Patient will improve tested UE MMT to at least 4/5 throughout for improved ability to perform [facility] ADLs. Baseline: See MMT  Goal status: INITIAL  3.  Patient will demo improved bilat cervical rotation by 10 degrees in order to improve ability to scan environment for safety and while driving. Baseline: See AROM Goal status: INITIAL  4. Patient will report a decrease in neck pain to 0/10 at rest for improved quality of life and ability to perform UE ADLs  Baseline: 3/10 Goal status: INITIAL   5. Patient will improve bilateral shoulder flexion to at least 125 degrees for improved ability to perform [place] ADLs.  Baseline: See AROM  Goal status: INITIAL   PLAN:  PT FREQUENCY: 1-2x/week  PT DURATION: 6 weeks  PLANNED INTERVENTIONS: Therapeutic exercises, Therapeutic activity, Neuromuscular re-education, Balance training, Gait training, Patient/Family education, Joint manipulation, Joint mobilization, Stair training, Aquatic Therapy, Dry Needling, Electrical stimulation, Spinal manipulation, Spinal mobilization, Cryotherapy, Moist heat, scar mobilization, Taping, Traction, Ultrasound, Biofeedback, Ionotophoresis 4mg /ml Dexamethasone, and Manual therapy.   PLAN FOR NEXT SESSION: Continue POC and may progress as tolerated with emphasis on centralization of pain, cervical strengthening, flexibility, and correction of posture. Continue with R first rib mobs, trial thoracic extension exercises, postural strengthening  12:40 PM, 12/22/22 Chrissie Noa L. Danira Nylander, PT, DPT, OCS Board-Certified Clinical  Specialist in [employer] # ([place]): [id number] [phone]

## 2022-12-29 ENCOUNTER — Ambulatory Visit (HOSPITAL_COMMUNITY): Payer: Medicare Other | Admitting: Physical Therapy

## 2022-12-29 DIAGNOSIS — R079 Chest pain, unspecified: Secondary | ICD-10-CM | POA: Diagnosis not present

## 2022-12-29 DIAGNOSIS — M5412 Radiculopathy, cervical region: Secondary | ICD-10-CM

## 2022-12-29 DIAGNOSIS — M6281 Muscle weakness (generalized): Secondary | ICD-10-CM

## 2022-12-29 NOTE — Therapy (Signed)
OUTPATIENT PHYSICAL THERAPY CERVICAL TREATMENT   Patient Name: [name] [name] MRN: [id number] DOB:1951/09/29, 72 y.o., male Today's Date: 12/29/2022  END OF SESSION:  PT End of Session - 12/29/22 1348     Visit Number 6    Number of Visits 12    Date for PT Re-Evaluation 12/30/22    Authorization Type Medicare A and B    Progress Note Due on Visit 10    PT Start Time [id number]    PT Stop Time 1426    PT Time Calculation (min) 38 min    Activity Tolerance Patient tolerated treatment well    Behavior During Therapy WFL for tasks assessed/performed            Past Medical History:  Diagnosis Date   Anxiety    CHF (congestive heart failure) ([place])    h/o due to fluid overload in recovery room   Coronary artery disease    2v CABG, 2004 ([facility])   COVID-19    DJD (degenerative joint disease)    Dyspnea    on exertion   GERD (gastroesophageal reflux disease)    History of kidney stones    3 stones yrs. ago   HLD (hyperlipidemia)    Hypertension    MI, old 2004   OA (osteoarthritis)    Obesity    Persistent atrial fibrillation (HCC)    failed medical therapy with sotalol, s/p PVI x 2   Sleep apnea    on CPAP   Spinal stenosis    Typical atrial flutter [facility])    Past Surgical History:  Procedure Laterality Date   ANTERIOR CERVICAL DECOMP/DISCECTOMY FUSION [name] 09/21/2019   Procedure: ANTERIOR CERVICAL DECOMPRESSION/DISCECTOMY FUSION CERVICAL THREE- CERVICAL FOUR, CERVICAL FOUR- CERVICAL FIVE, CERVICAL FIVE- CERVICAL SIX;  Surgeon: Newman Pies, MD;  Location: [place];  Service: Neurosurgery;  Laterality: [name];  ANTERIOR CERVICAL DECOMPRESSION/DISCECTOMY FUSION CERVICAL THREE- CERVICAL FOUR, CERVICAL FOUR- CERVICAL FIVE, CERVICAL FIVE- CERVICAL SIX   ATRIAL FIBRILLATION ABLATION [name] 05/24/2018   Procedure: ATRIAL FIBRILLATION ABLATION;  Surgeon: Thompson Grayer, MD;  Location: [place] [facility];  Service: Cardiovascular;  Laterality: [name];   BACK SURGERY  2011   CARPAL TUNNEL  RELEASE     bilateral   CHOLECYSTECTOMY     CORONARY ARTERY BYPASS GRAFT     2004   ELECTROPHYSIOLOGIC STUDY [name] 09/15/2016   Procedure: Atrial Fibrillation Ablation;  Surgeon: Thompson Grayer, MD;  Location: [place] [facility];  Service: Cardiovascular;  Laterality: [name];   fistula surgery     hemorrhoidectomy     LEFT HEART CATH AND CORS/GRAFTS ANGIOGRAPHY [name] 02/07/2018   Procedure: LEFT HEART CATH AND CORS/GRAFTS ANGIOGRAPHY;  Surgeon: Martinique, Peter M, MD;  Location: [place] [facility];  Service: Cardiovascular;  Laterality: [name];   LUMBAR LAMINECTOMY     right foot fracture     TEE WITHOUT CARDIOVERSION [name] 09/15/2016   Procedure: TRANSESOPHAGEAL ECHOCARDIOGRAM (TEE);  Surgeon: Lelon Perla, MD;  Location: [facility] ENDOSCOPY;  Service: Cardiovascular;  Laterality: [name];   TOTAL KNEE ARTHROPLASTY Left 07/07/2017   Procedure: LEFT TOTAL KNEE ARTHROPLASTY;  Surgeon: Latanya Maudlin, MD;  Location: [facility];  Service: Orthopedics;  Laterality: Left;   TRANSTHORACIC ECHOCARDIOGRAM  08/25/2010   EF 55-60%   Patient Active Problem List   Diagnosis Date Noted   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   Cervical myelopathy ([place]) 10/03/2019   Myelopathy concurrent with and due to spinal stenosis of cervical region [facility]) 09/21/2019   Paroxysmal atrial fibrillation ([place]) 05/24/2018  Unstable angina (HCC) 02/04/2018   Typical atrial flutter (HCC)    Spinal stenosis    QT prolongation    Palpitations    PAF (paroxysmal atrial fibrillation) (HCC)    OA (osteoarthritis)    Hypertension    HLD (hyperlipidemia)    Edema    DJD (degenerative joint disease)    Coronary artery disease    Chronic chest pain    Hx of total knee replacement, 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   A-fib ([place]) 09/15/2016   Chest pain 09/09/2016   Obesity 08/22/2016   Anxiety state 08/22/2016   Encounter for monitoring sotalol therapy 08/20/2016   Morbidly obese ([place]) 07/12/2016   Medical non-compliance 07/12/2016   Elevated TSH 07/09/2016   BPH  without urinary obstruction 07/09/2016   Need for hepatitis C screening test 07/09/2016   Idiopathic gout 07/09/2016   Hyperglycemia 08/01/2015   Atrial fibrillation with RVR ([place]) 03/28/2015   Routine general medical examination at a health care facility 08/11/2013   Chronic sinus bradycardia    Obstructive sleep apnea 09/12/2010   MYOCARDIAL INFARCTION 09/11/2010   Spinal stenosis, unspecified region other than cervical 09/11/2010   Hyperlipidemia with target LDL less than 70 09/08/2010   Depression 09/08/2010   Essential hypertension 09/08/2010   Coronary atherosclerosis 09/08/2010   Congestive heart failure ([place]) 09/08/2010   GERD 09/08/2010   MI, old 10/05/2002    PCP: Dellia Nims DNP   REFERRING PROVIDER: Patricia Nettle, NP  REFERRING DIAG: M79.601 (ICD-10-CM) - Pain in right arm  THERAPY DIAG:  Radiculopathy, cervical region  Rationale for Evaluation and Treatment: Rehabilitation  ONSET DATE: 09/20/2020  SUBJECTIVE:                                                                                                                                                                                                         SUBJECTIVE STATEMENT: Patient states what the guy did a couple visits ago really helped.  STates the pain is all in the same place, Rt side of neck into his UE.  Patient presents to therapy with complaint of RT arm weakness and pain. It all started after a neck surgery about 3 years ago. His LT arm went numb but he waited to see MD about it. They called it nerve damage and said there wasn't much to do. More recently, about 3 months ago, his RT arm started bothering him as far as pain in elbow into axilla and also weakness. He feels what he needs is to stretch his neck out to give space to the nerves.  He has not had previous therapy for these issues.   PERTINENT HISTORY:  Cervical fusion 2021, 3 x open heart surgery, A-fib, Hx of MI, LT TKA, Portion of  diaphragm paralyzed (patient report)   PAIN:  Are you having pain? Yes: NPRS scale: 4/10 Pain location: Rt elbow and arm  Pain description: aching, numb, tingling  Aggravating factors: turning head, bending/ lifting, shaving  Relieving factors: beer  PRECAUTIONS: Other: 20lb lifting restriction   WEIGHT BEARING RESTRICTIONS: No  FALLS:  Has patient fallen in last 6 months? No  OCCUPATION: Retired Clinical biochemist   PLOF: Independent  PATIENT GOALS: Get stretched out   NEXT MD VISIT: 02/19/23  OBJECTIVE:   DIAGNOSTIC FINDINGS:  NA  PATIENT SURVEYS:    COGNITION: Overall cognitive status: Within functional limits for tasks assessed  SENSATION: Decreased sensation to light touch about RUE   POSTURE: rounded shoulders and forward head  CERVICAL ROM:   Active ROM A/PROM (deg) eval  Flexion 42  Extension 29  Right lateral flexion   Left lateral flexion   Right rotation 45  Left rotation 47   (Blank rows = not tested)  UPPER EXTREMITY ROM:  Active ROM Right eval Left eval  Shoulder flexion 90 85  Shoulder extension    Shoulder abduction    Shoulder adduction    Shoulder extension    Shoulder internal rotation    Shoulder external rotation    Elbow flexion    Elbow extension    Wrist flexion    Wrist extension    Wrist ulnar deviation    Wrist radial deviation    Wrist pronation    Wrist supination     (Blank rows = not tested)  UPPER EXTREMITY MMT:  MMT Right eval Left eval  Shoulder flexion 4- 4-  Shoulder extension    Shoulder abduction    Shoulder adduction    Shoulder extension 4- 4-  Shoulder internal rotation 4 4  Shoulder external rotation    Middle trapezius    Lower trapezius    Elbow flexion    Elbow extension    Wrist flexion    Wrist extension    Wrist ulnar deviation    Wrist radial deviation    Wrist pronation    Wrist supination    Grip strength     (Blank rows = not tested)   TODAY'S TREATMENT:                                                                                                                               DATE:  12/29/22 UBE 4 minutes backward level 1 Standing:  GTB retractions 2X10  GTB rows 2X10  GTB extensions 2X10  Upper trap stretch 3X30" each side Seated:  manual to bil upper trap/scap  Myfascial to supraclavicular region Rt   12/22/22 Seated STM on B upper trapezius x 10' Shoulder rolls ant/post x 10 x 2 each Cervical rot x 3" x  10 x 2 on each L passive Upper trapezius stretch x 30" x 3 R active Upper trapezius stretch x 30" x 3 Mid rows x 3" x 10 RTB Unilateral ER R shoulder 2 x 10, RTB  12/08/22 Chin tuck 2x 10 x 5" Standing cervical retraction isometric 2 x 5 second holds Manual inferior R 1st rib glides grade II-III 5 x 1 minute Band rows RTB 2 x 15  Unilateral ER R shoulder 1x 10   12/03/22 Chin tuck 10 x 5" Scap retraction 10 x 5"  Band rows RTB 2 x 10  Radial nerve glides seated 2 x 10   11/26/22 Performed and checked for R ULTT Passive R median nerve glide x 1' Self R median nerve floss x 1' Supine cervical chin tucks x 3" x 5 Self upper trapezius stretch x 30" x 2 Shoulder rolls forward/backwards x 10 on each Scapular retraction x 5" Postural correction x 10" in sitting  11/18/22 Eval    PATIENT EDUCATION:  Education details: updated HEP Person educated: Patient Education method: Consulting civil engineer, Demonstration, Corporate treasurer cues, Verbal cues, and Handouts Education comprehension: needs further education  HOME EXERCISE PROGRAM: Access Code: [email or web address] URL: [email or web address] 12/08/22 - Standing Shoulder Row with Anchored Resistance  - 1 x daily - 7 x weekly - 2 sets - 10 reps  11/26/22 Access Code: [email or web address] URL: [email or web address] Date: 11/26/2022 Prepared by: Rexene Alberts  Exercises - Correct Seated Posture  - 2-3 x daily - 7 x weekly - 1 sets - 10 reps - 5 second hold - Seated Scapular Retraction  - 2-3 x daily - 7 x  weekly - 1-2 sets - 10 reps - 5 second hold - Median Nerve Flossing - Tray  - 1-2 x daily - 7 x weekly - Seated Upper Trapezius Stretch  - 1-2 x daily - 7 x weekly - 2 reps - 30 hold  Date: 11/18/2022 Prepared by: Josue Hector  Exercises - Correct Seated Posture  - 2-3 x daily - 7 x weekly - 1 sets - 10 reps - 5 second hold - Seated Scapular Retraction  - 2-3 x daily - 7 x weekly - 1-2 sets - 10 reps - 5 second hold - Seated Cervical Retraction  - 2-3 x daily - 7 x weekly - 1-2 sets - 10 reps - 5 second hold  ASSESSMENT:  CLINICAL IMPRESSION: Continued with focus on improving cervical flexibility and strength while reducing pain. Began with UBE for active warm up followed by postural strengthening.  PT required cues for general form, increased hold times and posturing during activity.  Extreme weakness and immobility in thoracic/scapular muscular noted with band activities.  No radicular symptoms reported into his Rt hand, however reports he does still continue to have these often. Noted with some SOB, suggested using spirometer to help possibly and to ask MD regarding this.Manual continued to bil upper traps, scaps and myofascial technique into supraclavicular region on Rt as well.  Pt tolerated this well and reported reduced pain following.  General tightness bilaterally. Pt will continue to benefit from skilled PT to address impairments and improve function.   OBJECTIVE IMPAIRMENTS: decreased activity tolerance, decreased mobility, decreased ROM, decreased strength, hypomobility, impaired flexibility, impaired UE functional use, improper body mechanics, postural dysfunction, and pain.   ACTIVITY LIMITATIONS: carrying, lifting, bending, sleeping, bathing, toileting, dressing, reach over head, hygiene/grooming, and caring for others  PARTICIPATION LIMITATIONS: meal prep, cleaning, laundry, driving, shopping, community activity, and yard work  PERSONAL FACTORS: Past/current experiences,  Time since onset of injury/illness/exacerbation, and 3+ comorbidities: See above  are also affecting patient's functional outcome.   REHAB POTENTIAL: Fair See above   CLINICAL DECISION MAKING: Stable/uncomplicated  EVALUATION COMPLEXITY: Low   GOALS: SHORT TERM GOALS: Target date: 12/30/2022  Patient will be independent with initial HEP and self-management strategies to improve functional outcomes Baseline:  Goal status: INITIAL   LONG TERM GOALS: Target date: 12/30/2022  Patient will be independent with advanced HEP and self-management strategies to improve functional outcomes Baseline:  Goal status: INITIAL  2.  Patient will improve tested UE MMT to at least 4/5 throughout for improved ability to perform [facility] ADLs. Baseline: See MMT  Goal status: INITIAL  3.  Patient will demo improved bilat cervical rotation by 10 degrees in order to improve ability to scan environment for safety and while driving. Baseline: See AROM Goal status: INITIAL  4. Patient will report a decrease in neck pain to 0/10 at rest for improved quality of life and ability to perform UE ADLs  Baseline: 3/10 Goal status: INITIAL   5. Patient will improve bilateral shoulder flexion to at least 125 degrees for improved ability to perform [place] ADLs.  Baseline: See AROM  Goal status: INITIAL   PLAN:  PT FREQUENCY: 1-2x/week  PT DURATION: 6 weeks  PLANNED INTERVENTIONS: Therapeutic exercises, Therapeutic activity, Neuromuscular re-education, Balance training, Gait training, Patient/Family education, Joint manipulation, Joint mobilization, Stair training, Aquatic Therapy, Dry Needling, Electrical stimulation, Spinal manipulation, Spinal mobilization, Cryotherapy, Moist heat, scar mobilization, Taping, Traction, Ultrasound, Biofeedback, Ionotophoresis 4mg /ml Dexamethasone, and Manual therapy.   PLAN FOR NEXT SESSION: Continue POC and may progress as tolerated with emphasis on centralization of pain, cervical  strengthening, flexibility, and correction of posture. Continue with R first rib mobs, trial thoracic extension exercises, postural strengthening  1:48 PM, 12/29/22 Teena Irani, PTA/CLT [place] Ph: [phone]

## 2022-12-30 ENCOUNTER — Other Ambulatory Visit: Payer: Self-pay | Admitting: Primary Care

## 2022-12-30 ENCOUNTER — Encounter: Payer: Self-pay | Admitting: Primary Care

## 2022-12-30 ENCOUNTER — Ambulatory Visit (INDEPENDENT_AMBULATORY_CARE_PROVIDER_SITE_OTHER): Payer: Medicare Other | Admitting: Primary Care

## 2022-12-30 VITALS — BP 156/70 | HR 48 | Ht 66.0 in | Wt 235.0 lb

## 2022-12-30 DIAGNOSIS — R0602 Shortness of breath: Secondary | ICD-10-CM

## 2022-12-30 DIAGNOSIS — J986 Disorders of diaphragm: Secondary | ICD-10-CM | POA: Diagnosis not present

## 2022-12-30 DIAGNOSIS — G4733 Obstructive sleep apnea (adult) (pediatric): Secondary | ICD-10-CM | POA: Diagnosis not present

## 2022-12-30 MED ORDER — LEVALBUTEROL HCL 0.63 MG/3ML IN NEBU: 0.6300 mg | INHALATION_SOLUTION | Freq: Four times a day (QID) | RESPIRATORY_TRACT | 2 refills | Status: AC | PRN

## 2022-12-30 NOTE — Assessment & Plan Note (Signed)
-   Patient is consistently wearing BiPAP but reports increased episodes of apneas.  He is self adjusting pressure.  Download today from [place] card does not give average AHI.  Current min EPAP 8 cm H2O; Max IPAP 13.6; pressure support 4.8.  Recommending patient have BiPAP titration study in lab to reassess optimal pressure settings.  Courage patient wear BiPAP every night for minimum 4 to 6 hours or longer.

## 2022-12-30 NOTE — Addendum Note (Signed)
Addended by: June Leap on: 12/30/2022 01:54 PM   Modules accepted: Orders

## 2022-12-30 NOTE — Progress Notes (Signed)
@Patient  ID: [name] [name], male    DOB: August 11, 1951, 72 y.o.   MRN: [id number]  Chief Complaint  Patient presents with   Follow-up    OSA DOE  ED 12/17/22    Referring provider: Tempie Hoist, FNP  HPI: 72 yo obese, never smoker for follow-up of severe OSA and severe dyspnea following neck surgery    PMH -OSA, AFIB, CHF, HTN, myocardial infarction, GERD, hyperlipidemia, spinal stenosis.    10/2019 following cervical discectomy and decompression for spinal stenosis. developed diaphragmatic paralysis post neck surgery   Chief Complaint  Patient presents with   Follow-up    Bipap working well.     Auto BiPAP ordered oct 2022  He feels that BiPAP is much better than his previous CPAP experience. He uses this even in the daytime.  He reports dyspnea on exertion and when supine.  He used to sleep in a recliner but is now able to sleep in a bed with his head elevated   12/30/2022 - Interim hx  Patient presents today for ED follow-up for chest pain. He presented to the emergency room on 12/17/2022 with chest discomfort and shortness of breath. He has been more short winded last several years. He has afib, on anticoagulation and follows with cardiology. He does notice improvement in breathing with Albuterol hfa but medication increases his blood pressure. CXR showed similar mild linear atelectasis/scar left lung base, otherwise both lungs are clear. Wearing BIPAP nightly, feels he is having more events at night. He adjusts his pressure himself. He has a chronic cough with clear mucus production.    Significant tests/ events reviewed  Sniff test 09/2019 neg CT angiogram chest 1/8 low lung volumes bilateral, raised bilateral hemidiaphragm CT angiogram 1/21 left lower lobe atelectasis/consolidation  CXR 12/17/22 >> similar mild linear atelectasis/scar left lung base, otherwise both lungs are clear   07/2021 BiPAP titration >> optimal 15/11, could not tolerate CPAP  PSG 09/2010 RDI  80/h Auto 12/2010:  Optimal cpap 14cm, great compliance   Allergies  Allergen Reactions   Vancomycin Anaphylaxis, Shortness Of Breath and Other (See Comments)    Was informed after heart surgery Breathing problem patient unsure   Aldactone [Spironolactone]     GYNECOMASTIA    Chlorthalidone Other (See Comments)    GOUT   Nsaids Other (See Comments)    On blood thinner   Coreg [Carvedilol] Other (See Comments)    Gout flare in ankle     Immunization History  Administered Date(s) Administered   Fluad Quad(high Dose 65+) 06/30/2021, 06/20/2022   Influenza Inj Mdck Quad Pf 07/07/2018   Influenza Whole 07/05/2010, 07/14/2012   Influenza, High Dose Seasonal PF 07/31/2019   Influenza, Seasonal, Injecte, Preservative Fre 08/11/2013   Influenza,inj,Quad PF,6+ Mos 06/15/2014, 07/15/2015, 07/09/2016, 06/22/2017   Influenza-Unspecified 09/08/2003, 07/10/2004, 07/30/2007, 06/23/2008, 06/24/2009, 07/01/2010, 07/18/2011, 07/12/2012, 07/13/2013, 07/07/2018, 07/31/2019, 06/21/2020   Moderna Sars-Covid-2 Vaccination 12/13/2019, 01/13/2020, 05/20/2020   Pneumococcal Polysaccharide-23 08/26/2010   Td 10/05/1993, 09/08/2010   Tdap 09/08/2010   Zoster Recombinat (Shingrix) 08/24/2020   Zoster, Live 08/18/2013    Past Medical History:  Diagnosis Date   Anxiety    CHF (congestive heart failure) ([facility])    h/o due to fluid overload in recovery room   Coronary artery disease    2v CABG, 2004 ([place])   COVID-19    DJD (degenerative joint disease)    Dyspnea    on exertion   GERD (gastroesophageal reflux disease)    History of kidney  stones    3 stones yrs. ago   HLD (hyperlipidemia)    Hypertension    MI, old 2004   OA (osteoarthritis)    Obesity    Persistent atrial fibrillation (HCC)    failed medical therapy with sotalol, s/p PVI x 2   Sleep apnea    on CPAP   Spinal stenosis    Typical atrial flutter (HCC)     Tobacco History: Social History   Tobacco Use  Smoking Status  Never  Smokeless Tobacco Never   Counseling given: Not Answered   Outpatient Medications Prior to Visit  Medication Sig Dispense Refill   acetaminophen (TYLENOL) 325 MG tablet Take 2 tablets (650 mg total) by mouth every 4 (four) hours as needed for mild pain ((score 1 to 3) or temp > 100.5).     albuterol (VENTOLIN HFA) 108 (90 Base) MCG/ACT inhaler Inhale 2 puffs into the lungs every 6 (six) hours as needed.     atorvastatin (LIPITOR) 20 MG tablet Take 1 tablet (20 mg total) by mouth daily. 90 tablet 1   Coenzyme Q10 (CO Q 10 PO) Take 100 mg by mouth daily.     diltiazem (CARDIZEM CD) 180 MG 24 hr capsule TAKE 1 CAPSULE BY MOUTH EVERY DAY 90 capsule 3   ELIQUIS 5 MG TABS tablet TAKE 1 TABLET BY MOUTH TWICE A DAY 180 tablet 1   furosemide (LASIX) 20 MG tablet TAKE 2 TABLETS BY MOUTH EVERY DAY 180 tablet 2   hydrALAZINE (APRESOLINE) 100 MG tablet TAKE 1 TABLET BY MOUTH 3 TIMES DAILY. 270 tablet 3   irbesartan (AVAPRO) 300 MG tablet TAKE 1 TABLET BY MOUTH EVERY DAY 90 tablet 1   LORazepam (ATIVAN) 0.5 MG tablet Take 0.5 mg by mouth every 8 (eight) hours.     meclizine (ANTIVERT) 25 MG tablet Take 25 mg by mouth 3 (three) times daily as needed.     metoprolol tartrate (LOPRESSOR) 25 MG tablet Take 1 tablet (25 mg total) by mouth 2 (two) times daily. May take an additional tablet once a day if heart rate is greater than 110 180 tablet 1   Multiple Vitamin (MULTIVITAMIN) tablet Take 1 tablet by mouth daily.     omeprazole (PRILOSEC) 20 MG capsule TAKE 1 CAPSULE BY MOUTH EVERY OTHER DAY 45 capsule 3   PRESCRIPTION MEDICATION Pt uses BiPAP machine at bedtime     sertraline (ZOLOFT) 100 MG tablet Take 100 mg by mouth daily.     ezetimibe (ZETIA) 10 MG tablet Take 1 tablet (10 mg total) by mouth daily. (Patient not taking: Reported on 12/30/2022) 90 tablet 1   nitroGLYCERIN (NITROSTAT) 0.4 MG SL tablet Place 1 tablet (0.4 mg total) under the tongue every 5 (five) minutes x 3 doses as needed for chest  pain. (Patient not taking: Reported on 12/30/2022) 25 tablet 1   No facility-administered medications prior to visit.   Review of Systems  Review of Systems  Constitutional: Negative.   HENT: Negative.    Respiratory:  Positive for cough and shortness of breath.   Psychiatric/Behavioral:  Positive for sleep disturbance.    Physical Exam  BP (!) 156/70 Comment: 11:12 am Patient does take BP meds  Pulse (!) 48   Ht 5\' 6"  (1.676 m)   Wt 235 lb (106.6 kg)   SpO2 96%   BMI 37.93 kg/m  Physical Exam Constitutional:      General: He is not in acute distress.    Appearance: Normal appearance.  He is obese. He is not ill-appearing.  HENT:     Head: Normocephalic and atraumatic.  Cardiovascular:     Rate and Rhythm: Normal rate and regular rhythm.  Pulmonary:     Effort: Pulmonary effort is normal.     Breath sounds: Normal breath sounds.     Comments: Diminished left lung base Musculoskeletal:        General: Normal range of motion.  Skin:    General: Skin is warm and dry.  Neurological:     General: No focal deficit present.     Mental Status: He is alert and oriented to person, place, and time. Mental status is at baseline.  Psychiatric:        Mood and Affect: Mood normal.        Behavior: Behavior normal.        Thought Content: Thought content normal.        Judgment: Judgment normal.      Lab Results:  CBC    Component Value Date/Time   WBC 12.9 (H) 12/17/2022 1237   RBC 4.71 12/17/2022 1237   HGB 12.9 (L) 12/17/2022 1237   HGB 13.5 05/17/2018 1110   HCT 39.1 12/17/2022 1237   HCT 41.8 05/17/2018 1110   PLT 332 12/17/2022 1237   PLT 302 05/17/2018 1110   MCV 83.0 12/17/2022 1237   MCV 88 05/17/2018 1110   MCH 27.4 12/17/2022 1237   MCHC 33.0 12/17/2022 1237   RDW 15.4 12/17/2022 1237   RDW 13.4 05/17/2018 1110   LYMPHSABS 1.3 02/20/2021 1525   LYMPHSABS 1.8 05/17/2018 1110   MONOABS 0.9 02/20/2021 1525   EOSABS 0.0 02/20/2021 1525   EOSABS 0.2  05/17/2018 1110   BASOSABS 0.1 02/20/2021 1525   BASOSABS 0.1 05/17/2018 1110    BMET    Component Value Date/Time   NA 143 12/17/2022 1237   NA 143 05/17/2018 1110   K 3.5 12/17/2022 1237   CL 108 12/17/2022 1237   CO2 27 12/17/2022 1237   GLUCOSE 94 12/17/2022 1237   BUN 10 12/17/2022 1237   BUN 19 05/17/2018 1110   CREATININE 1.10 12/17/2022 1237   CREATININE 1.05 10/10/2018 1024   CALCIUM 9.3 12/17/2022 1237   GFRNONAA >60 12/17/2022 1237   GFRAA >60 05/17/2020 0818    BNP    Component Value Date/Time   BNP 265.7 (H) 12/17/2022 1300   BNP 88 11/01/2019 1601    ProBNP    Component Value Date/Time   PROBNP 310.0 (H) 08/23/2010 0915    Imaging: DG Chest 2 View  Result Date: 12/17/2022 CLINICAL DATA:  cp EXAM: CHEST - 2 VIEW COMPARISON:  None Available. FINDINGS: The heart size and mediastinal contours are unchanged. CABG and median sternotomy. Similar mild linear atelectasis/scar at the left lung base. Otherwise, both lungs are clear. The visualized skeletal structures are unremarkable. IMPRESSION: No active cardiopulmonary disease. Electronically Signed   By: Margaretha Sheffield M.D.   On: 12/17/2022 13:13     Assessment & Plan:   Obstructive sleep apnea - Patient is consistently wearing BiPAP but reports increased episodes of apneas.  He is self adjusting pressure.  Download today from [facility] card does not give average AHI.  Current min EPAP 8 cm H2O; Max IPAP 13.6; pressure support 4.8.  Recommending patient have BiPAP titration study in lab to reassess optimal pressure settings.  Courage patient wear BiPAP every night for minimum 4 to 6 hours or longer.  Diaphragm paralysis - Patient has  been feeling more short winded lately. Felt to have some degree of diaphragmatic dysfunction.  Sniff test in December 2020 was negative. CTA in January 2021 showed mild eventration of the left hemidiaphragm.  Left lower lobe atelectasis/consolidation. CXR 12/17/22 showed similar mild  linear atelectasis/scar left lung base, otherwise both lungs are clear  - May want to consider getting updated CT imaging of chest following pulmonary testing if abnormal  - Encourage weight loss, continue BIPAP and sending in Levalbuterol nebulizer to use every 4-6 hours for shortness of breath  - Advised he follow-up in 4-6 weeks with Dr Elsworth Soho after PFTs      Martyn Ehrich, NP 12/30/2022

## 2022-12-30 NOTE — Assessment & Plan Note (Addendum)
-   Patient has been feeling more short winded lately. Felt to have some degree of diaphragmatic dysfunction.  Sniff test in December 2020 was negative. CTA in January 2021 showed mild eventration of the left hemidiaphragm.  Left lower lobe atelectasis/consolidation. CXR 12/17/22 showed similar mild linear atelectasis/scar left lung base, otherwise both lungs are clear  - May want to consider getting updated CT imaging of chest following pulmonary testing if abnormal  - Encourage weight loss, continue BIPAP and sending in Levalbuterol nebulizer to use every 4-6 hours for shortness of breath  - Advised he follow-up in 4-6 weeks with Dr Elsworth Soho after PFTs

## 2022-12-30 NOTE — Patient Instructions (Addendum)
Recommendations: Continue to work on weight loss Continue BIPAP nightly Use Levalbuterol nebulizer every 4-6 hours as needed for shortness of breath  Use incentive spirometer 3-4 times a day   Orders:  New nebulizer machine re: diaphragm paralysis, dyspnea Incentive spirometer (ordered) Pulmonary function testing (ordered- [facility]) BIPAP titration study (ordered)  Rx: Levalbuterol sent   Follow-up: 4-6 weeks with Dr. Elsworth Soho in [place] (after PFTs)

## 2022-12-31 ENCOUNTER — Encounter (HOSPITAL_BASED_OUTPATIENT_CLINIC_OR_DEPARTMENT_OTHER): Payer: Self-pay

## 2022-12-31 ENCOUNTER — Telehealth: Payer: Self-pay | Admitting: Primary Care

## 2022-12-31 NOTE — Telephone Encounter (Signed)
Called to clarify order for nebulizer with spirometer.  Stated that they would need a diagnosis to bill medicare for the spirometer and they don't have the nebulizer.  Please advise and call to discuss further at [phone]

## 2023-01-01 NOTE — Telephone Encounter (Signed)
Called Katy from [employer] and she states the Diaphragm paralysis and the SOB is not covered for Medicare for the nebulizer. She stated she called and spoke to patient and he states if he can not get it covered then he is not wanting it.   Do you have any other diagnosis for the nebulizer machine [name]?  Please advise

## 2023-01-01 NOTE — Telephone Encounter (Signed)
Called katy back and the Dyspnea and the restrictive lung disease both do not work. She is going to fax over the list of Dxs that are approved from medicare. Nothing further needed

## 2023-01-01 NOTE — Telephone Encounter (Signed)
Try dyspnea and/or restrictive lung disease

## 2023-01-06 ENCOUNTER — Ambulatory Visit (HOSPITAL_BASED_OUTPATIENT_CLINIC_OR_DEPARTMENT_OTHER): Payer: Medicare Other | Attending: Primary Care | Admitting: Pulmonary Disease

## 2023-01-06 DIAGNOSIS — I493 Ventricular premature depolarization: Secondary | ICD-10-CM | POA: Insufficient documentation

## 2023-01-06 DIAGNOSIS — G4733 Obstructive sleep apnea (adult) (pediatric): Secondary | ICD-10-CM | POA: Insufficient documentation

## 2023-01-14 ENCOUNTER — Ambulatory Visit (INDEPENDENT_AMBULATORY_CARE_PROVIDER_SITE_OTHER): Payer: Medicare Other | Admitting: Pulmonary Disease

## 2023-01-14 ENCOUNTER — Encounter (HOSPITAL_BASED_OUTPATIENT_CLINIC_OR_DEPARTMENT_OTHER): Payer: Self-pay | Admitting: Pulmonary Disease

## 2023-01-14 VITALS — BP 148/88 | HR 63 | Temp 98.4°F | Ht 66.0 in | Wt 235.6 lb

## 2023-01-14 DIAGNOSIS — G4733 Obstructive sleep apnea (adult) (pediatric): Secondary | ICD-10-CM

## 2023-01-14 DIAGNOSIS — R0609 Other forms of dyspnea: Secondary | ICD-10-CM | POA: Diagnosis not present

## 2023-01-14 DIAGNOSIS — J986 Disorders of diaphragm: Secondary | ICD-10-CM | POA: Diagnosis not present

## 2023-01-14 DIAGNOSIS — R0602 Shortness of breath: Secondary | ICD-10-CM

## 2023-01-14 LAB — PULMONARY FUNCTION TEST
DL/VA % pred: 122 %
DL/VA: 5.09 ml/min/mmHg/L
DLCO cor % pred: 72 %
DLCO cor: 16.72 ml/min/mmHg
DLCO unc % pred: 68 %
DLCO unc: 15.86 ml/min/mmHg
FEF 25-75 Post: 1.99 L/sec
FEF 25-75 Pre: 1.58 L/sec
FEF2575-%Change-Post: 25 %
FEF2575-%Pred-Post: 92 %
FEF2575-%Pred-Pre: 73 %
FEV1-%Change-Post: 7 %
FEV1-%Pred-Post: 53 %
FEV1-%Pred-Pre: 49 %
FEV1-Post: 1.49 L
FEV1-Pre: 1.39 L
FEV1FVC-%Change-Post: 8 %
FEV1FVC-%Pred-Pre: 112 %
FEV6-%Change-Post: 0 %
FEV6-%Pred-Post: 46 %
FEV6-%Pred-Pre: 46 %
FEV6-Post: 1.66 L
FEV6-Pre: 1.66 L
FEV6FVC-%Change-Post: 1 %
FEV6FVC-%Pred-Post: 106 %
FEV6FVC-%Pred-Pre: 105 %
FVC-%Change-Post: -1 %
FVC-%Pred-Post: 43 %
FVC-%Pred-Pre: 44 %
FVC-Post: 1.66 L
FVC-Pre: 1.68 L
Post FEV1/FVC ratio: 90 %
Post FEV6/FVC ratio: 100 %
Pre FEV1/FVC ratio: 83 %
Pre FEV6/FVC Ratio: 99 %
RV % pred: 81 %
RV: 1.8 L
TLC % pred: 66 %
TLC: 4.15 L

## 2023-01-14 MED ORDER — BUDESONIDE-FORMOTEROL FUMARATE 80-4.5 MCG/ACT IN AERO: 2.0000 | INHALATION_SPRAY | Freq: Two times a day (BID) | RESPIRATORY_TRACT | 0 refills | Status: DC

## 2023-01-14 NOTE — Patient Instructions (Signed)
Full PFT Performed Today  

## 2023-01-14 NOTE — Procedures (Signed)
Patient Name: [name], [name] Date: 01/06/2023 Gender: Male D.O.B: 04-22-1951 Age (years): 82 Referring Provider: Ames Dura NP Height (inches): 66 Interpreting Physician: Cyril Mourning MD, ABSM Weight (lbs): 225 RPSGT: Armen Pickup BMI: 36 MRN: [id number] Neck Size: 18.00 <br> <br> CLINICAL INFORMATION The patient is referred for a BiPAP titration to treat sleep apnea due to residual event s on his Bipap download    07/2021 BiPAP titration >> optimal 15/11, could not tolerate CPAP  PSG 09/2010 RDI 80/h  SLEEP STUDY TECHNIQUE As per the AASM Manual for the Scoring of Sleep and Associated Events v2.3 (April 2016) with a hypopnea requiring 4% desaturations.  The channels recorded and monitored were frontal, central and occipital EEG, electrooculogram (EOG), submentalis EMG (chin), nasal and oral airflow, thoracic and abdominal wall motion, anterior tibialis EMG, snore microphone, electrocardiogram, and pulse oximetry. Bilevel positive airway pressure (BPAP) was initiated at the beginning of the study and titrated to treat sleep-disordered breathing.  MEDICATIONS Medications self-administered by patient taken the night of the study : [name]  RESPIRATORY PARAMETERS Optimal IPAP Pressure (cm): 17 AHI at Optimal Pressure (/hr) 0 Optimal EPAP Pressure (cm): 13   Overall Minimal O2 (%): 86.0 Minimal O2 at Optimal Pressure (%): 89.0 SLEEP ARCHITECTURE Start Time: 10:30:54 PM Stop Time: 4:36:05 AM Total Time (min): 365.2 Total Sleep Time (min): 246.5 Sleep Latency (min): 78.1 Sleep Efficiency (%): 67.5% REM Latency (min): 106.5 WASO (min): 40.6 Stage N1 (%): 6.5% Stage N2 (%): 69.2% Stage N3 (%): 0.0% Stage R (%): 24.3 Supine (%): 0.00 Arousal Index (/hr): 28.7     CARDIAC DATA The 2 lead EKG demonstrated sinus rhythm. The mean heart rate was 51.3 beats per minute. Other EKG findings include: PVCs.   LEG MOVEMENT DATA The total Periodic Limb Movements of Sleep (PLMS) were 0.  The PLMS index was 0.0. A PLMS index of <15 is considered normal in adults.  IMPRESSIONS - An optimal biPAP pressure was selected for this patient ( 17 / 13cm of water) - Moderate oxygen desaturations were observed during this titration (min O2 = 86.0%). - No snoring was audible during this study. - 2-lead EKG demonstrated: PVCs - Clinically significant periodic limb movements were not noted during this study. Arousals associated with PLMs were significant.   DIAGNOSIS - Obstructive Sleep Apnea (G47.33)   RECOMMENDATIONS - Trial of BiPAP therapy on 17/13 cm H2O with a Medium size Resmed Full Face AirFit F20 mask and heated humidification. - Avoid alcohol, sedatives and other CNS depressants that may worsen sleep apnea and disrupt normal sleep architecture. - Sleep hygiene should be reviewed to assess factors that may improve sleep quality. - Weight management and regular exercise should be initiated or continued. - [facility] to [facility] for [facility]-evaluation after 4 weeks of therapy   Cyril Mourning MD Board Certified in Sleep medicine

## 2023-01-14 NOTE — Progress Notes (Signed)
Full PFT Performed Today  

## 2023-01-14 NOTE — Assessment & Plan Note (Signed)
Cause for increased dyspnea is unclear. He does not seem to be in fluid overload.  PFTs did not show significant airway obstruction and no significant bronchodilator response. Albuterol does seem to provide some relief but does cause increase in blood pressure.  Will provide him steroid/LABA combination and treat as asthma.  Prescription for Symbicort will be sent. If necessary we could also send in a prescription for nebulizer with albuterol nebs and using asthma as a diagnosis however he would prefer to hold off for now We will consider diaphragmatic paralysis in the past but sniff test was negative and current chest x-ray does not show this either. We also discussed weight-based dosing of Lasix.

## 2023-01-14 NOTE — Patient Instructions (Signed)
  X Rx for symbicort - 2 puffs twice daily, rinse mouth after use  X Change to auto bipap - EPAP min 8, IPAP max 17, PS +4   Weigh yourself twice a week & take extra lasix if wt > 235 lbs  Take OTC zyrtec daily x 4 weeks

## 2023-01-14 NOTE — Progress Notes (Signed)
   Subjective:    Patient ID: [name] [name], male    DOB: [birth year], 72 y.o.   MRN: [id number]  HPI  72 yo obese, never smoker for follow-up of severe OSA and persistent dyspnea following neck surgery -on Auto BiPAP since oct 2022     PMH -OSA, AFIB, CHF, HTN, myocardial infarction, GERD, hyperlipidemia, spinal stenosis.    10/2019 following cervical discectomy and decompression for spinal stenosis. developed diaphragmatic paralysis post neck surgery  82-month follow-up visit ED visit 3/14 for CP/sob He was seen by APP in follow-up visit on 3/27, given prescription for albuterol nebulizer PFTs and BiPAP titration study were scheduled the Diaphragm paralysis and the SOB is not covered for Medicare for the nebulizer -so he never received this  He complains of increased shortness of breath on exertion.  He was using albuterol MDI up to 2 or 3 times daily but this raises blood pressure so he stopped, and requests alternative. We reviewed PFTs today   Chest x-ray 3/14 shows clear lungs, no pleural effusions. Weight is marginally increased from 230 to 235 pounds -he is compliant with Lasix 40 mg daily and follows with [name]  Significant tests/ events reviewed  PFTs/2024 normal ratio 83, severe restriction, FEV1 49%, FVC 44%, TLC 66%, DLCO 68%  Sniff test 09/2019 neg CT angiogram chest 1/8 low lung volumes bilateral, raised bilateral hemidiaphragm CT angiogram 1/21 left lower lobe atelectasis/consolidation    BiPAP titration 01/2023 17/13 cm optimal   07/2021 BiPAP titration >> optimal 15/11, could not tolerate CPAP    PSG 09/2010 RDI 80/h Auto 12/2010:  Optimal cpap 14cm, great compliance  Review of Systems neg for any significant sore throat, dysphagia, itching, sneezing, nasal congestion or excess/ purulent secretions, fever, chills, sweats, unintended wt loss, pleuritic or exertional cp, hempoptysis, orthopnea pnd or change in chronic leg swelling. Also denies presyncope,  palpitations, heartburn, abdominal pain, nausea, vomiting, diarrhea or change in bowel or urinary habits, dysuria,hematuria, rash, arthralgias, visual complaints, headache, numbness weakness or ataxia.     Objective:   Physical Exam  Gen. Pleasant, obese, in no distress, normal affect ENT - no pallor,icterus, no post nasal drip, class 2-3 airway Neck: No JVD, no thyromegaly, no carotid bruits Lungs: no use of accessory muscles, no dullness to percussion, decreased without rales or rhonchi  Cardiovascular: Rhythm regular, heart sounds  normal, no murmurs or gallops, 1+ peripheral edema Abdomen: soft and non-tender, no hepatosplenomegaly, BS normal. Musculoskeletal: No deformities, no cyanosis or clubbing Neuro:  alert, non focal, no tremors       Assessment & Plan:

## 2023-01-14 NOTE — Assessment & Plan Note (Signed)
PAP download shows reasonable control of events on 13.6/8 cm there is Rachell 3.4 moderate leak.  He has excellent compliance and he sometimes even uses this during the daytime. Based on titration study we will change him to auto BiPAP settings with pressure support +4, EPAP minimum of 8 and IPAP maximum of 17 cm hopefully the increased pressure support will help him recent labs showed bicarbonate of 27 so he does not seem to be retaining much carbon dioxide  Weight loss encouraged, compliance with goal of at least 4-6 hrs every night is the expectation. Advised against medications with sedative side effects Cautioned against driving when sleepy - understanding that sleepiness will vary on a day to day basis

## 2023-02-01 ENCOUNTER — Other Ambulatory Visit: Payer: Self-pay | Admitting: Cardiology

## 2023-02-03 ENCOUNTER — Other Ambulatory Visit: Payer: Self-pay | Admitting: Nurse Practitioner

## 2023-02-13 ENCOUNTER — Other Ambulatory Visit: Payer: Self-pay | Admitting: Cardiology

## 2023-03-16 ENCOUNTER — Telehealth (HOSPITAL_BASED_OUTPATIENT_CLINIC_OR_DEPARTMENT_OTHER): Payer: Self-pay | Admitting: Pulmonary Disease

## 2023-03-16 MED ORDER — BUDESONIDE-FORMOTEROL FUMARATE 80-4.5 MCG/ACT IN AERO: 2.0000 | INHALATION_SPRAY | Freq: Two times a day (BID) | RESPIRATORY_TRACT | 2 refills | Status: AC

## 2023-03-16 NOTE — Telephone Encounter (Signed)
PT calling because [facility] has sent in req for his budesonide-formoterol . He only has a few puffs left and sounds SOB. Golda Acre says 2 refills, but the sticker on his bottle says 2 refills  [facility] [address] [facility]  PT # is [phone]

## 2023-03-16 NOTE — Telephone Encounter (Signed)
Please try to send in 2 refills. (Rerouted to [facility])

## 2023-03-16 NOTE — Telephone Encounter (Signed)
Called and spoke w/ pt wife let her know that I am sending in refills of his inhaler. She verbalized understanding. NFN att.

## 2023-03-19 ENCOUNTER — Ambulatory Visit: Payer: Medicare Other | Admitting: Cardiovascular Disease

## 2023-04-25 ENCOUNTER — Other Ambulatory Visit: Payer: Self-pay | Admitting: Cardiology

## 2023-04-26 NOTE — Telephone Encounter (Signed)
Prescription refill request for Eliquis received. Indication: AF Last office visit: 12/07/22  Shawnie Dapper NP Scr: 1.10 on 12/17/22  Epic Age: 72 Weight: 108.2kg  Based on above findings Eliquis 5mg  twice daily is the appropriate dose.  Refill approved.

## 2023-05-21 ENCOUNTER — Ambulatory Visit: Payer: Medicare Other | Admitting: Pulmonary Disease

## 2023-05-21 ENCOUNTER — Encounter: Payer: Self-pay | Admitting: Pulmonary Disease

## 2023-05-21 VITALS — BP 142/68 | HR 55 | Ht 66.0 in | Wt 232.0 lb

## 2023-05-21 DIAGNOSIS — R0609 Other forms of dyspnea: Secondary | ICD-10-CM | POA: Diagnosis not present

## 2023-05-21 DIAGNOSIS — I10 Essential (primary) hypertension: Secondary | ICD-10-CM | POA: Diagnosis not present

## 2023-05-21 DIAGNOSIS — G4733 Obstructive sleep apnea (adult) (pediatric): Secondary | ICD-10-CM

## 2023-05-21 NOTE — Progress Notes (Signed)
   Subjective:    Patient ID: [name] [name], male    DOB: 08/17/1951, 72 y.o.   MRN: [id number]  HPI  72 yo obese, never smoker for follow-up of severe OSA and persistent dyspnea following neck surgery -on Auto BiPAP since oct 2022     PMH -OSA, AFIB, CHF, HTN, myocardial infarction, GERD, hyperlipidemia, spinal stenosis.    10/2019 following cervical discectomy and decompression for spinal stenosis. developed diaphragmatic paralysis post neck surgery  4 month FU visit  Last OV >> he reported increased dyspnea and renew prescription for Symbicort , lasix based on wt  He states that he has been using Symbicort alone as an basis and this seems to work especially the albuterol. Breathing is much improved. Blood pressure is high today just due to traffic and rushing in trying to make the appointment time.  He denies headache or blurred vision.  BiPAP is working well.  He has maintained around 13.6/8 cm  Significant tests/ events reviewed   PFTs/2024 normal ratio 83, severe restriction, FEV1 49%, FVC 44%, TLC 66%, DLCO 68%   Sniff test 09/2019 neg CT angiogram chest 1/8 low lung volumes bilateral, raised bilateral hemidiaphragm CT angiogram 1/21 left lower lobe atelectasis/consolidation     BiPAP titration 01/2023 17/13 cm optimal    07/2021 BiPAP titration >> optimal 15/11, could not tolerate CPAP    PSG 09/2010 RDI 80/h Auto 12/2010:  Optimal cpap 14cm, great compliance  Review of Systems neg for any significant sore throat, dysphagia, itching, sneezing, nasal congestion or excess/ purulent secretions, fever, chills, sweats, unintended wt loss, pleuritic or exertional cp, hempoptysis, orthopnea pnd or change in chronic leg swelling. Also denies presyncope, palpitations, heartburn, abdominal pain, nausea, vomiting, diarrhea or change in bowel or urinary habits, dysuria,hematuria, rash, arthralgias, visual complaints, headache, numbness weakness or ataxia.     Objective:    Physical Exam  Gen. Pleasant, obese, in no distress ENT - no lesions, no post nasal drip Neck: No JVD, no thyromegaly, no carotid bruits Lungs: no use of accessory muscles, no dullness to percussion, decreased without rales or rhonchi  Cardiovascular: Rhythm regular, heart sounds  normal, no murmurs or gallops, no peripheral edema Musculoskeletal: No deformities, no cyanosis or clubbing , no tremors       Assessment & Plan:

## 2023-05-21 NOTE — Assessment & Plan Note (Signed)
Download available until April which shows good control of events on current settings excellent compliance and minimal leak. He brought in the card today but we could not obtain a more recent download. Overall BiPAP is certainly helped improve his daytime somnolence and fatigue Weight loss encouraged, compliance with goal of at least 4-6 hrs every night is the expectation. Advised against medications with sedative side effects Cautioned against driving when sleepy - understanding that sleepiness will vary on a day to day basis

## 2023-05-21 NOTE — Assessment & Plan Note (Signed)
He reports subjective improvement with Symbicort and we will continue prescription for him to use on a as needed basis

## 2023-05-21 NOTE — Addendum Note (Signed)
Addended by: Shelby Dubin on: 05/21/2023 12:06 PM   Modules accepted: Orders

## 2023-05-21 NOTE — Patient Instructions (Signed)
Continue symbicort  BiPAP supplies will be renewed x 1 year

## 2023-05-21 NOTE — Assessment & Plan Note (Signed)
Blood pressure high today but he is asymptomatic. He will check it at home again and follow-up with PCP

## 2023-06-06 ENCOUNTER — Other Ambulatory Visit: Payer: Self-pay | Admitting: Nurse Practitioner

## 2023-06-21 ENCOUNTER — Ambulatory Visit: Payer: Medicare Other | Admitting: Cardiology

## 2023-06-24 ENCOUNTER — Ambulatory Visit: Payer: Medicare Other | Admitting: Pulmonary Disease

## 2023-08-04 ENCOUNTER — Other Ambulatory Visit: Payer: Self-pay | Admitting: Nurse Practitioner

## 2023-08-04 MED ORDER — IRBESARTAN 300 MG PO TABS: 300.0000 mg | ORAL_TABLET | Freq: Every day | ORAL | 1 refills | Status: DC

## 2023-08-10 ENCOUNTER — Telehealth: Payer: Self-pay | Admitting: Cardiology

## 2023-08-10 NOTE — Telephone Encounter (Signed)
   Pre-operative Risk Assessment    Patient Name: [name] [name]  DOB: 1950/10/16 MRN: [id number]      Request for Surgical Clearance    Procedure:   Cervical Epidural injection   Date of Surgery:  Clearance TBD                             {    Surgeon:  Dr. Aileen Fass Surgeon's Group or Practice Name:  [facility] Phone number:  [phone] Fax number:  [phone]   Type of Clearance Requested:   - Pharmacy:  Hold Apixaban (Eliquis) for 3 days prior to procedure    Type of Anesthesia:  Not Indicated   Additional requests/questions:    Sallyanne Havers   08/10/2023, 12:34 PM

## 2023-08-11 NOTE — Telephone Encounter (Signed)
Pharmacy please advise on holding Eliquis prior to epidural ESI scheduled for TBD. Thank you.

## 2023-08-12 NOTE — Telephone Encounter (Signed)
Patient with diagnosis of afib on Eliquis for anticoagulation.    Procedure: Cervical Epidural injection  Date of procedure: TBD   CHA2DS2-VASc Score = 4   This indicates a 4.8% annual risk of stroke. The patient's score is based upon: CHF History: 1 HTN History: 1 Diabetes History: 0 Stroke History: 0 Vascular Disease History: 1 Age Score: 1 Gender Score: 0      CrCl 71 ml/min Platelet count 333  Per office protocol, patient can hold Eliquis for 3 days prior to procedure.    **This guidance is not considered finalized until pre-operative APP has relayed final recommendations.**

## 2023-08-12 NOTE — Telephone Encounter (Signed)
   Patient Name: [name] [name]  DOB: 1951-05-26 MRN: [id number]  Primary Cardiologist: Dina Rich, MD  Clinical pharmacists have reviewed the patient's past medical history, labs, and current medications as part of preoperative protocol coverage. The following recommendations have been made:  Per office protocol, patient can hold Eliquis for 3 days prior to procedure.    I will route this recommendation to the requesting party via Epic fax function and remove from pre-op pool.  Please call with questions.  Napoleon Form, Leodis Rains, NP 08/12/2023, 8:48 AM

## 2023-08-26 ENCOUNTER — Encounter: Payer: Self-pay | Admitting: Cardiology

## 2023-08-26 ENCOUNTER — Ambulatory Visit: Payer: Medicare Other | Attending: Cardiology | Admitting: Cardiology

## 2023-08-26 VITALS — BP 116/80 | HR 60 | Ht 66.0 in | Wt 234.0 lb

## 2023-08-26 DIAGNOSIS — E782 Mixed hyperlipidemia: Secondary | ICD-10-CM | POA: Insufficient documentation

## 2023-08-26 DIAGNOSIS — E785 Hyperlipidemia, unspecified: Secondary | ICD-10-CM | POA: Diagnosis present

## 2023-08-26 DIAGNOSIS — I2510 Atherosclerotic heart disease of native coronary artery without angina pectoris: Secondary | ICD-10-CM | POA: Insufficient documentation

## 2023-08-26 DIAGNOSIS — R002 Palpitations: Secondary | ICD-10-CM | POA: Diagnosis present

## 2023-08-26 DIAGNOSIS — I10 Essential (primary) hypertension: Secondary | ICD-10-CM | POA: Diagnosis present

## 2023-08-26 DIAGNOSIS — I471 Supraventricular tachycardia, unspecified: Secondary | ICD-10-CM | POA: Diagnosis present

## 2023-08-26 DIAGNOSIS — Z79899 Other long term (current) drug therapy: Secondary | ICD-10-CM | POA: Insufficient documentation

## 2023-08-26 NOTE — Progress Notes (Signed)
Clinical Summary [name] [name] is a 72 y.o.male seen today for follow up of the following medical problems.     1. HTN      Headaches on hydralazine, stopped. Started on prazosin due to lack of other options, stopped due to severe dizziness. Dilt prevoiusly lowered from 360 to 180 for possible fatigue and headaches. Off prazosin due to dizziness. Aldaconte caused gynecomastia, eplrenone caused upset stomach. Coreg caused higher bp's per his report, changed back to metprolol  - bp with pcp 133/76. - home bp's vary to some degree.     2. CAD   - prior 2 vessel CABG in 2004 at [facility]   - apparently had follow up cath in 2006 from [place] with patent grafts. Reports negative stress test in 2011 by Dr Hyacinth Meeker in [place].  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 Can last hours at a time.  - compliant with meds - chronic SOB, in general difficultly laying flat for procedures including stress testing.      3. OSA   - compliant with bipap - followed by Dr Vassie Loll.      4. Hyperlipidemia   - noted some side effects on higher lipitor doses, has tolerated lower dose well  - PA Peck started zetia 12/2022 - 02/2023 TC 154 TG 217 HDL 45 LDL 66   5. Afib - s/p ablation. He had failed rate control as well as multiple antiarrhythmics - no recent palpitations. No bleeding on eliquis   - recurrent afib 02/2018 - 05/2018 repeat ablation.  - did not tolerate higher doses of beta blocker - no recent symptoms   6. 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     Past Medical History:   Diagnosis Date   Anxiety    CHF (congestive heart failure) (HCC)    h/o due to fluid overload in recovery room   Coronary artery disease    2v CABG, 2004 ([facility])   COVID-19    DJD (degenerative joint disease)    Dyspnea    on exertion   GERD (gastroesophageal reflux disease)    History of kidney stones    3 stones yrs. ago   HLD (hyperlipidemia)    Hypertension    MI, old 2004   OA (osteoarthritis)    Obesity    Persistent atrial fibrillation (HCC)    failed medical therapy with sotalol, s/p PVI x 2   Sleep apnea    on CPAP   Spinal stenosis    Typical atrial flutter (HCC)      Allergies  Allergen Reactions   Vancomycin Anaphylaxis, Shortness Of Breath and Other (See Comments)    Was informed after heart surgery Breathing problem patient unsure   Aldactone [Spironolactone]     GYNECOMASTIA    Chlorthalidone Other (See Comments)    GOUT   Nsaids Other (See Comments)    On blood thinner   Coreg [Carvedilol] Other (See Comments)    Gout flare in ankle      Current Outpatient Medications  Medication Sig Dispense Refill  acetaminophen (TYLENOL) 325 MG tablet Take 2 tablets (650 mg total) by mouth every 4 (four) hours as needed for mild pain ((score 1 to 3) or temp > 100.5).     albuterol (VENTOLIN HFA) 108 (90 Base) MCG/ACT inhaler Inhale 2 puffs into the lungs every 6 (six) hours as needed.     atorvastatin (LIPITOR) 20 MG tablet TAKE 1 TABLET BY MOUTH EVERY DAY 90 tablet 1   budesonide-formoterol (SYMBICORT) 80-4.5 MCG/ACT inhaler Inhale 2 puffs into the lungs 2 (two) times daily. 10.2 g 2   Coenzyme Q10 (CO Q 10 PO) Take 100 mg by mouth daily.     diltiazem (CARDIZEM CD) 180 MG 24 hr capsule TAKE 1 CAPSULE BY MOUTH EVERY DAY 90 capsule 1   ELIQUIS 5 MG TABS tablet TAKE 1 TABLET BY MOUTH TWICE A DAY 180 tablet 1   furosemide (LASIX) 20 MG tablet TAKE 2 TABLETS BY MOUTH EVERY DAY 180 tablet 1   hydrALAZINE (APRESOLINE) 100 MG tablet TAKE 1 TABLET BY MOUTH 3 TIMES  DAILY. 270 tablet 3   irbesartan (AVAPRO) 300 MG tablet Take 1 tablet (300 mg total) by mouth daily. 90 tablet 1   levalbuterol (XOPENEX) 0.63 MG/3ML nebulizer solution Take 3 mLs (0.63 mg total) by nebulization every 6 (six) hours as needed for wheezing or shortness of breath. 360 mL 2   LORazepam (ATIVAN) 0.5 MG tablet Take 0.5 mg by mouth every 8 (eight) hours.     metoprolol tartrate (LOPRESSOR) 25 MG tablet Take 1 tablet (25 mg total) by mouth 2 (two) times daily. May take an additional tablet once a day if heart rate is greater than 110 180 tablet 1   Multiple Vitamin (MULTIVITAMIN) tablet Take 1 tablet by mouth daily.     omeprazole (PRILOSEC) 20 MG capsule TAKE 1 CAPSULE BY MOUTH EVERY OTHER DAY 45 capsule 3   PRESCRIPTION MEDICATION Pt uses BiPAP machine at bedtime     sertraline (ZOLOFT) 100 MG tablet Take 100 mg by mouth daily.     No current facility-administered medications for this visit.     Past Surgical History:  Procedure Laterality Date   ANTERIOR CERVICAL DECOMP/DISCECTOMY FUSION [name] 09/21/2019   Procedure: ANTERIOR CERVICAL DECOMPRESSION/DISCECTOMY FUSION CERVICAL THREE- CERVICAL FOUR, CERVICAL FOUR- CERVICAL FIVE, CERVICAL FIVE- CERVICAL SIX;  Surgeon: Tressie Stalker, MD;  Location: [facility] OR;  Service: Neurosurgery;  Laterality: [name];  ANTERIOR CERVICAL DECOMPRESSION/DISCECTOMY FUSION CERVICAL THREE- CERVICAL FOUR, CERVICAL FOUR- CERVICAL FIVE, CERVICAL FIVE- CERVICAL SIX   ATRIAL FIBRILLATION ABLATION [name] 05/24/2018   Procedure: ATRIAL FIBRILLATION ABLATION;  Surgeon: Hillis Range, MD;  Location: [facility] INVASIVE CV LAB;  Service: Cardiovascular;  Laterality: [name];   BACK SURGERY  2011   CARPAL TUNNEL RELEASE     bilateral   CHOLECYSTECTOMY     CORONARY ARTERY BYPASS GRAFT     2004   ELECTROPHYSIOLOGIC STUDY [name] 09/15/2016   Procedure: Atrial Fibrillation Ablation;  Surgeon: Hillis Range, MD;  Location[facility] INVASIVE CV LAB;  Service: Cardiovascular;  Laterality: [name];    fistula surgery     hemorrhoidectomy     LEFT HEART CATH AND CORS/GRAFTS ANGIOGRAPHY [name] 02/07/2018   Procedure: LEFT HEART CATH AND CORS/GRAFTS ANGIOGRAPHY;  Surgeon: [name], Peter M, MD;  Location: [facility] INVASIVE CV LAB;  Service: Cardiovascular;  Laterality: [name];   LUMBAR LAMINECTOMY     right foot fracture     TEE WITHOUT CARDIOVERSION [name] 09/15/2016   Procedure: TRANSESOPHAGEAL ECHOCARDIOGRAM (TEE);  Surgeon: Lewayne Bunting, MD;  Location: [facility] ENDOSCOPY;  Service: Cardiovascular;  Laterality: [name];   TOTAL KNEE ARTHROPLASTY Left 07/07/2017   Procedure: LEFT TOTAL KNEE ARTHROPLASTY;  Surgeon: Ranee Gosselin, MD;  Location: [facility] ORS;  Service: Orthopedics;  Laterality: Left;   TRANSTHORACIC ECHOCARDIOGRAM  08/25/2010   EF 55-60%     Allergies  Allergen Reactions   Vancomycin Anaphylaxis, Shortness Of Breath and Other (See Comments)    Was informed after heart surgery Breathing problem patient unsure   Aldactone [Spironolactone]     GYNECOMASTIA    Chlorthalidone Other (See Comments)    GOUT   Nsaids Other (See Comments)    On blood thinner   Coreg [Carvedilol] Other (See Comments)    Gout flare in ankle       Family History  Problem Relation Age of Onset   Breast cancer Mother    Hypertension Mother    Coronary artery disease Father    Stroke Father    Hypertension Father      Social History Mr. Lawrimore reports that he has never smoked. He has never used smokeless tobacco. Mr. Nazareno reports current alcohol use.   Review of Systems CONSTITUTIONAL: No weight loss, fever, chills, weakness or fatigue.  HEENT: Eyes: No visual loss, blurred vision, double vision or yellow sclerae.No hearing loss, sneezing, congestion, runny nose or sore throat.  SKIN: No rash or itching.  CARDIOVASCULAR: per hpi RESPIRATORY: per hpi GASTROINTESTINAL: No anorexia, nausea, vomiting or diarrhea. No abdominal pain or blood.  GENITOURINARY: No burning on urination, no polyuria NEUROLOGICAL:  No headache, dizziness, syncope, paralysis, ataxia, numbness or tingling in the extremities. No change in bowel or bladder control.  MUSCULOSKELETAL: No muscle, back pain, joint pain or stiffness.  LYMPHATICS: No enlarged nodes. No history of splenectomy.  PSYCHIATRIC: No history of depression or anxiety.  ENDOCRINOLOGIC: No reports of sweating, cold or heat intolerance. No polyuria or polydipsia.  Marland Kitchen   Physical Examination Today's Vitals   08/26/23 0909  BP: 116/80  Pulse: 60  SpO2: 98%  Weight: 234 lb (106.1 kg)  Height: 5\' 6"  (1.676 m)   Body mass index is 37.77 kg/m.  Gen: resting comfortably, no acute distress HEENT: no scleral icterus, pupils equal round and reactive, no palptable cervical adenopathy,  CV: RRR, no m/rg, no jvd Resp: Clear to auscultation bilaterally GI: abdomen is soft, non-tender, non-distended, normal bowel sounds, no hepatosplenomegaly MSK: extremities are warm, no edema.  Skin: warm, no rash Neuro:  no focal deficits Psych: appropriate affect   Diagnostic Studies   03/2015 echo Study Conclusions  - Left ventricle: The cavity size was normal. Wall thickness was   increased in a pattern of mild LVH. Systolic function was normal.   The estimated ejection fraction was in the range of 50% to 55%.   Wall motion was normal; there were no regional wall motion   abnormalities. Doppler parameters are consistent with high   ventricular filling pressure. - Left atrium: The atrium was mildly dilated.     03/2015 Lexiscan MPI No T wave inversion was noted during stress. There was no ST segment deviation noted during stress. The study is normal. This is a low risk study. The left ventricular ejection fraction is normal (55-65%). Nuclear stress EF: 61%.   02/2018 cath Prox LAD lesion is 100% stenosed. Prox Cx to Mid Cx lesion is 60% stenosed. Post Atrio lesion is 100% stenosed. SVG graft was visualized by angiography and is large. The graft exhibits  minimal luminal irregularities. LIMA graft was  visualized by angiography and is normal in caliber. The graft exhibits no disease. The left ventricular systolic function is normal. LV end diastolic pressure is normal. The left ventricular ejection fraction is 55-65% by visual estimate.   1. 2 vessel occlusive CAD    - 100% proximal LAD    - 60% mid LCx    - 100% PLOM with left to right collaterals. 2. Patent LIMA to the LAD 3. Patent SVG to the diagonal 4. Normal LV function 5. Normal LVEDP     12/2018 heart monitor 48 hr holter monitor Min HR 46, Max HR 106, Avg HR 62 Rare ventricular ectopy, all in the form of isolated PVCs Occasional supraventricular ectopy, primarily as isolated PACs. One 3 beat run of atach. No significant arrhythmias    Assessment and Plan   1. HTN - bp's overall at goal. Side effects with prior agents as documented above, continue current therapy.    2. Afib/acquired thrombophilia -overall doing well, continue current meds   3. CAD -chronic atypical chest pains unchanged.  - we will continue current meds   4. Hyperlipidemia - LDL at goal on last check, will update lipid panel - continue current meds     Antoine Poche, M.D.

## 2023-08-26 NOTE — Patient Instructions (Signed)
Medication Instructions:   Continue all current medications.   Labwork:  CMET, CBC, TSH, FLP, HgA1c, Mg - orders given today Reminder:  Nothing to eat or drink after 12 midnight prior to labs. Office will contact with results via phone, letter or mychart.     Testing/Procedures:  none  Follow-Up:  6 months   Any Other Special Instructions Will Be Listed Below (If Applicable).   If you need a refill on your cardiac medications before your next appointment, please call your pharmacy.

## 2023-09-01 ENCOUNTER — Telehealth: Payer: Self-pay | Admitting: Cardiology

## 2023-09-01 NOTE — Telephone Encounter (Signed)
Patient called to follow-up on lab results.

## 2023-09-07 NOTE — Telephone Encounter (Signed)
-----   Message from Dina Rich sent at 09/06/2023  3:25 PM EST ----- Labs show cholesterol is above goal. He has only tolerated low dose statin. I can't recall if he had tried the zetia 10mg  daily that NP Philis Nettle had recommended at prior visit. If not I would recommend adding to his current regimen, would remain on low dose statin  Dominga Ferry MD

## 2023-09-07 NOTE — Telephone Encounter (Signed)
Discussed and says he has never been on zetia and will contact us back if he is willing to start zetia after discussing results with his other doctors.  Copy sent to PCP

## 2023-09-25 ENCOUNTER — Other Ambulatory Visit: Payer: Self-pay | Admitting: Cardiology

## 2023-10-07 ENCOUNTER — Telehealth: Payer: Self-pay | Admitting: Pulmonary Disease

## 2023-10-07 NOTE — Telephone Encounter (Signed)
 Patient will now be using [employer] in [facility]

## 2023-11-02 ENCOUNTER — Other Ambulatory Visit: Payer: Self-pay | Admitting: Cardiology

## 2023-11-02 MED ORDER — DILTIAZEM HCL ER COATED BEADS 180 MG PO CP24: 180.0000 mg | ORAL_CAPSULE | Freq: Every day | ORAL | 1 refills | Status: DC

## 2023-11-08 ENCOUNTER — Telehealth: Payer: Self-pay | Admitting: Cardiology

## 2023-11-08 MED ORDER — METOPROLOL TARTRATE 25 MG PO TABS: 25.0000 mg | ORAL_TABLET | Freq: Two times a day (BID) | ORAL | 1 refills | Status: DC

## 2023-11-08 MED ORDER — HYDRALAZINE HCL 100 MG PO TABS: 100.0000 mg | ORAL_TABLET | Freq: Three times a day (TID) | ORAL | 1 refills | Status: DC

## 2023-11-08 MED ORDER — FUROSEMIDE 20 MG PO TABS: 40.0000 mg | ORAL_TABLET | Freq: Every day | ORAL | 1 refills | Status: DC

## 2023-11-08 MED ORDER — ATORVASTATIN CALCIUM 20 MG PO TABS: 20.0000 mg | ORAL_TABLET | Freq: Every day | ORAL | 1 refills | Status: DC

## 2023-11-08 MED ORDER — IRBESARTAN 300 MG PO TABS: 300.0000 mg | ORAL_TABLET | Freq: Every day | ORAL | 1 refills | Status: DC

## 2023-11-08 MED ORDER — APIXABAN 5 MG PO TABS: 5.0000 mg | ORAL_TABLET | Freq: Two times a day (BID) | ORAL | 1 refills | Status: DC

## 2023-11-08 NOTE — Telephone Encounter (Signed)
*  STAT* If patient is at the pharmacy, call can be transferred to refill team.   1. Which medications need to be refilled? (please list name of each medication and dose if known) atorvastatin (LIPITOR) 20 MG tablet   ELIQUIS 5 MG TABS tablet ]  furosemide (LASIX) 20 MG tablet   hydrALAZINE (APRESOLINE) 100 MG tablet    irbesartan (AVAPRO) 300 MG tablet   metoprolol tartrate (LOPRESSOR) 25 MG tablet   2. Which pharmacy/location (including street and city if local pharmacy) is medication to be sent to?  [facility] [address] [place] - [address]    3. Do they need a 30 day or 90 day supply? 90

## 2023-11-08 NOTE — Telephone Encounter (Signed)
Filled all but eliquis, sent to anticoag

## 2023-11-08 NOTE — Telephone Encounter (Signed)
Prescription refill request for Eliquis received. Indication: AF Last office visit: 08/26/23  Dominga Ferry MD Scr: 1.11 on 08/30/23  Epic Age: 73 Weight: 106.1kg  Based on above findings Eliquis 5mg  twice daily is the appropriate dose.  Refill approved.

## 2024-02-11 ENCOUNTER — Encounter: Payer: Self-pay | Admitting: Cardiology

## 2024-02-11 ENCOUNTER — Ambulatory Visit: Payer: Medicare Other | Attending: Cardiology | Admitting: Cardiology

## 2024-02-11 VITALS — BP 140/82 | HR 64 | Ht 66.0 in | Wt 233.6 lb

## 2024-02-11 DIAGNOSIS — E782 Mixed hyperlipidemia: Secondary | ICD-10-CM

## 2024-02-11 DIAGNOSIS — I10 Essential (primary) hypertension: Secondary | ICD-10-CM | POA: Diagnosis present

## 2024-02-11 DIAGNOSIS — I2510 Atherosclerotic heart disease of native coronary artery without angina pectoris: Secondary | ICD-10-CM | POA: Diagnosis present

## 2024-02-11 DIAGNOSIS — I480 Paroxysmal atrial fibrillation: Secondary | ICD-10-CM

## 2024-02-11 MED ORDER — METOPROLOL TARTRATE 25 MG PO TABS: 25.0000 mg | ORAL_TABLET | Freq: Two times a day (BID) | ORAL | 3 refills | Status: DC

## 2024-02-11 MED ORDER — IRBESARTAN 300 MG PO TABS: 300.0000 mg | ORAL_TABLET | Freq: Every day | ORAL | 3 refills | Status: DC

## 2024-02-11 MED ORDER — DILTIAZEM HCL ER COATED BEADS 180 MG PO CP24: 180.0000 mg | ORAL_CAPSULE | Freq: Every day | ORAL | 3 refills | Status: DC

## 2024-02-11 MED ORDER — APIXABAN 5 MG PO TABS: 5.0000 mg | ORAL_TABLET | Freq: Two times a day (BID) | ORAL | 3 refills | Status: DC

## 2024-02-11 MED ORDER — HYDRALAZINE HCL 100 MG PO TABS: 100.0000 mg | ORAL_TABLET | Freq: Three times a day (TID) | ORAL | 3 refills | Status: DC

## 2024-02-11 MED ORDER — FUROSEMIDE 20 MG PO TABS: 40.0000 mg | ORAL_TABLET | Freq: Every day | ORAL | 3 refills | Status: DC

## 2024-02-11 MED ORDER — ATORVASTATIN CALCIUM 20 MG PO TABS: 20.0000 mg | ORAL_TABLET | Freq: Every day | ORAL | 3 refills | Status: DC

## 2024-02-11 MED ORDER — EZETIMIBE 10 MG PO TABS: 10.0000 mg | ORAL_TABLET | Freq: Every day | ORAL | 3 refills | Status: DC

## 2024-02-11 NOTE — Patient Instructions (Signed)
 Medication Instructions:   Begin Zetia  10mg  daily  All cardiac medications refilled today as well.   Continue all other medications.     Labwork:  none  Testing/Procedures:  none  Follow-Up:  6 months   Any Other Special Instructions Will Be Listed Below (If Applicable).   If you need a refill on your cardiac medications before your next appointment, please call your pharmacy.

## 2024-02-11 NOTE — Progress Notes (Signed)
 Clinical Summary Mr. Ditter is a 73 y.o.male seen today for follow up of the following medical problems.     1. HTN      Headaches on hydralazine , stopped however now back on. Started on prazosin  due to lack of other options, stopped due to severe dizziness. Dilt prevoiusly lowered from 360 to 180 for possible fatigue and headaches. Off prazosin  due to dizziness. Aldaconte caused gynecomastia, eplrenone caused upset stomach. Coreg  caused higher bp's per his report, changed back to metprolol  - bp with pcp 133/76. - home bp's vary to some degree.     - home bp's typically 130s/70s - often elevated in clinic but typically controled at home   2. CAD   - prior 2 vessel CABG in 2004 at [facility]   - apparently had follow up cath in 2006 from [place] with patent grafts. Reports negative stress test in 2011 by Dr Annabell Key in [place].  - 03/2015 Lexiscan  MPI no ischemia - long history of chronic atypical muskoloskeltal chest pain        - 02/2018 seen in clinic with symptoms concernign for unstable angina - 02/2018 cath with native disease and patent grafts      - chronic neck and arm pains, prior neck surgery - right and and left chest pain, dull/pressure. Can last hours at a time.  -chronic stable symptoms unchanged -sedentary lifestyle due to chronic back pains.      3. OSA   - compliant with bipap - followed by Dr Villa Greaser.      4. Hyperlipidemia   - noted some side effects on higher lipitor doses, has tolerated lower dose well  - PA Peck started zetia  12/2022 - 02/2023 TC 154 TG 217 HDL 45 LDL 66  08/2023 TC 161 TG 096 HDL 45 LDL 103 - had discussed adding zetia  at prior appointments but has not been started.      5. Afib - s/p ablation. He had failed rate control as well as multiple antiarrhythmics - no recent palpitations. No bleeding on eliquis    - recurrent afib 02/2018 - 05/2018 repeat ablation.  - did not tolerate higher doses of beta blocker - no  recent symptoms  - EKG today shows SR, PACs - no significant symptoms - compliant with meds. No bleeding on eliquis    6. 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    - chronic symptoms overall changed   7. Chronic neck/back pain - followed in pain clinic   Past Medical History:  Diagnosis Date   Anxiety    CHF (congestive heart failure) (HCC)    h/o due to fluid overload in recovery room   Coronary artery disease    2v CABG, 2004 ([facility])   COVID-19    DJD (degenerative joint disease)    Dyspnea    on exertion   GERD (gastroesophageal reflux disease)    History of kidney stones    3 stones yrs. ago   HLD (hyperlipidemia)    Hypertension    MI, old 2004   OA (osteoarthritis)    Obesity    Persistent atrial fibrillation (HCC)    failed medical therapy with sotalol , s/p PVI x 2   Sleep apnea    on CPAP   Spinal stenosis    Typical atrial flutter (HCC)      Allergies  Allergen Reactions   Vancomycin Anaphylaxis,  Shortness Of Breath and Other (See Comments)    Was informed after heart surgery Breathing problem patient unsure   Aldactone  [Spironolactone ]     GYNECOMASTIA    Chlorthalidone  Other (See Comments)    GOUT   Nsaids Other (See Comments)    On blood thinner   Coreg  [Carvedilol ] Other (See Comments)    Gout flare in ankle      Current Outpatient Medications  Medication Sig Dispense Refill   acetaminophen  (TYLENOL ) 325 MG tablet Take 2 tablets (650 mg total) by mouth every 4 (four) hours as needed for mild pain ((score 1 to 3) or temp > 100.5).     albuterol  (VENTOLIN  HFA) 108 (90 Base) MCG/ACT inhaler Inhale 2 puffs into the lungs every 6 (six) hours as needed.     apixaban  (ELIQUIS ) 5 MG TABS tablet Take 1 tablet (5 mg total) by mouth 2 (two) times daily. 180 tablet 1   atorvastatin  (LIPITOR) 20 MG tablet Take 1 tablet (20 mg total) by  mouth daily. 90 tablet 1   budesonide -formoterol  (SYMBICORT ) 80-4.5 MCG/ACT inhaler Inhale 2 puffs into the lungs 2 (two) times daily. 10.2 g 2   Coenzyme Q10 (CO Q 10 PO) Take 100 mg by mouth daily.     dicyclomine (BENTYL) 20 MG tablet Take 20 mg by mouth 4 (four) times daily.     diltiazem  (CARDIZEM  CD) 180 MG 24 hr capsule Take 1 capsule (180 mg total) by mouth daily. 90 capsule 1   furosemide  (LASIX ) 20 MG tablet Take 2 tablets (40 mg total) by mouth daily. 180 tablet 1   hydrALAZINE  (APRESOLINE ) 100 MG tablet Take 1 tablet (100 mg total) by mouth 3 (three) times daily. 270 tablet 1   irbesartan  (AVAPRO ) 300 MG tablet Take 1 tablet (300 mg total) by mouth daily. 90 tablet 1   levalbuterol  (XOPENEX ) 0.63 MG/3ML nebulizer solution Take 3 mLs (0.63 mg total) by nebulization every 6 (six) hours as needed for wheezing or shortness of breath. 360 mL 2   LORazepam  (ATIVAN ) 0.5 MG tablet Take 0.5 mg by mouth every 8 (eight) hours.     metoprolol  tartrate (LOPRESSOR ) 25 MG tablet Take 1 tablet (25 mg total) by mouth 2 (two) times daily. May take an additional tablet once a day if heart rate is greater than 110 180 tablet 1   Multiple Vitamin (MULTIVITAMIN) tablet Take 1 tablet by mouth daily.     omeprazole  (PRILOSEC) 20 MG capsule TAKE 1 CAPSULE BY MOUTH EVERY OTHER DAY 45 capsule 3   PRESCRIPTION MEDICATION Pt uses BiPAP machine at bedtime     sertraline  (ZOLOFT ) 100 MG tablet Take 100 mg by mouth daily.     No current facility-administered medications for this visit.     Past Surgical History:  Procedure Laterality Date   ANTERIOR CERVICAL DECOMP/DISCECTOMY FUSION [name] 09/21/2019   Procedure: ANTERIOR CERVICAL DECOMPRESSION/DISCECTOMY FUSION CERVICAL THREE- CERVICAL FOUR, CERVICAL FOUR- CERVICAL FIVE, CERVICAL FIVE- CERVICAL SIX;  Surgeon: Garry Kansas, MD;  Location: [facility] OR;  Service: Neurosurgery;  Laterality: [name];  ANTERIOR CERVICAL DECOMPRESSION/DISCECTOMY FUSION CERVICAL THREE- CERVICAL  FOUR, CERVICAL FOUR- CERVICAL FIVE, CERVICAL FIVE- CERVICAL SIX   ATRIAL FIBRILLATION ABLATION [name] 05/24/2018   Procedure: ATRIAL FIBRILLATION ABLATION;  Surgeon: Jolly Needle, MD;  Location: [facility] INVASIVE CV LAB;  Service: Cardiovascular;  Laterality: [name];   BACK SURGERY  2011   CARPAL TUNNEL RELEASE     bilateral   CHOLECYSTECTOMY     CORONARY ARTERY BYPASS GRAFT  2004   ELECTROPHYSIOLOGIC STUDY [name] 09/15/2016   Procedure: Atrial Fibrillation Ablation;  Surgeon: Jolly Needle, MD;  Location: [facility] INVASIVE CV LAB;  Service: Cardiovascular;  Laterality: [name];   fistula surgery     hemorrhoidectomy     LEFT HEART CATH AND CORS/GRAFTS ANGIOGRAPHY [name] 02/07/2018   Procedure: LEFT HEART CATH AND CORS/GRAFTS ANGIOGRAPHY;  Surgeon: [name], Peter M, MD;  Location: [facility] INVASIVE CV LAB;  Service: Cardiovascular;  Laterality: [name];   LUMBAR LAMINECTOMY     right foot fracture     TEE WITHOUT CARDIOVERSION [name] 09/15/2016   Procedure: TRANSESOPHAGEAL ECHOCARDIOGRAM (TEE);  Surgeon: Lenise Quince, MD;  Location: [facility] ENDOSCOPY;  Service: Cardiovascular;  Laterality: [name];   TOTAL KNEE ARTHROPLASTY Left 07/07/2017   Procedure: LEFT TOTAL KNEE ARTHROPLASTY;  Surgeon: Hazle Lites, MD;  Location: [facility] ORS;  Service: Orthopedics;  Laterality: Left;   TRANSTHORACIC ECHOCARDIOGRAM  08/25/2010   EF 55-60%     Allergies  Allergen Reactions   Vancomycin Anaphylaxis, Shortness Of Breath and Other (See Comments)    Was informed after heart surgery Breathing problem patient unsure   Aldactone  [Spironolactone ]     GYNECOMASTIA    Chlorthalidone  Other (See Comments)    GOUT   Nsaids Other (See Comments)    On blood thinner   Coreg  Cali.Calandra ] Other (See Comments)    Gout flare in ankle       Family History  Problem Relation Age of Onset   Breast cancer Mother    Hypertension Mother    Coronary artery disease Father    Stroke Father    Hypertension Father      Social History Mr. Magner reports  that he has never smoked. He has never used smokeless tobacco. Mr. Fambrough reports current alcohol use.    Physical Examination Today's Vitals   02/11/24 0836 02/11/24 0928  BP: (!) 150/80 (!) 140/82  Pulse: 64   SpO2: 97%   Weight: 233 lb 9.6 oz (106 kg)   Height: 5\' 6"  (1.676 m)    Body mass index is 37.7 kg/m.  Gen: resting comfortably, no acute distress HEENT: no scleral icterus, pupils equal round and reactive, no palptable cervical adenopathy,  CV: RRR, no m/rg, no jvd Resp: Clear to auscultation bilaterally GI: abdomen is soft, non-tender, non-distended, normal bowel sounds, no hepatosplenomegaly MSK: extremities are warm, no edema.  Skin: warm, no rash Neuro:  no focal deficits Psych: appropriate affect   Diagnostic Studies  03/2015 echo Study Conclusions  - Left ventricle: The cavity size was normal. Wall thickness was   increased in a pattern of mild LVH. Systolic function was normal.   The estimated ejection fraction was in the range of 50% to 55%.   Wall motion was normal; there were no regional wall motion   abnormalities. Doppler parameters are consistent with high   ventricular filling pressure. - Left atrium: The atrium was mildly dilated.     03/2015 Lexiscan  MPI No T wave inversion was noted during stress. There was no ST segment deviation noted during stress. The study is normal. This is a low risk study. The left ventricular ejection fraction is normal (55-65%). Nuclear stress EF: 61%.   02/2018 cath Prox LAD lesion is 100% stenosed. Prox Cx to Mid Cx lesion is 60% stenosed. Post Atrio lesion is 100% stenosed. SVG graft was visualized by angiography and is large. The graft exhibits minimal luminal irregularities. LIMA graft was visualized by angiography and is normal in caliber. The graft exhibits  no disease. The left ventricular systolic function is normal. LV end diastolic pressure is normal. The left ventricular ejection fraction is  55-65% by visual estimate.   1. 2 vessel occlusive CAD    - 100% proximal LAD    - 60% mid LCx    - 100% PLOM with left to right collaterals. 2. Patent LIMA to the LAD 3. Patent SVG to the diagonal 4. Normal LV function 5. Normal LVEDP     12/2018 heart monitor 48 hr holter monitor Min HR 46, Max HR 106, Avg HR 62 Rare ventricular ectopy, all in the form of isolated PVCs Occasional supraventricular ectopy, primarily as isolated PACs. One 3 beat run of atach. No significant arrhythmias     Assessment and Plan   1. HTN -Side effects with prior agents as documented above - bp elevated in clinic but home numbers at goal - continue current meds   2. Afib/acquired thrombophilia - no significant symptoms, continue current meds including elqiuis for stroke prevention - EKG today shows SR, PACs    3. CAD -chronic atypical chest pains unchanged.  -- continue current therapy at this time   4. Hyperlipidemia - LDL above goal. Limited statin tolerance. Will add zetia  10mg  daily.     F/u 6 months   Laurann Pollock, M.D.

## 2024-03-13 ENCOUNTER — Ambulatory Visit (HOSPITAL_BASED_OUTPATIENT_CLINIC_OR_DEPARTMENT_OTHER): Payer: Self-pay | Admitting: Pulmonary Disease

## 2024-03-13 NOTE — Telephone Encounter (Signed)
 Please advise as we have no available appts for acute

## 2024-03-13 NOTE — Telephone Encounter (Signed)
 Pt does not wish to schedule at this time he was offered 03/15/2024 with Dr Waylan Haggard and Dr Waymond Hailey but he declined

## 2024-03-13 NOTE — Telephone Encounter (Signed)
 Pt is scheduled with Dr. Gaynell Keeler for 03/14/24.

## 2024-03-13 NOTE — Telephone Encounter (Signed)
 FYI Only or Action Required?: Action required by provider  Patient is followed in Pulmonology for Obstructive Sleep Apnea, last seen on 05/21/2023 by Lind Repine, MD. Called Nurse Triage reporting Shortness of Breath. Symptoms began worsening over the past 3-4 weeks. Interventions attempted: Mucinex. Symptoms are: gradually worsening.  Triage Disposition: See HCP Within 4 Hours (Or PCP Triage)  Patient/caregiver understands and will follow disposition?: No, wishes to speak with PCP                       Copied from CRM [id number]. Topic: Clinical - Red Word Triage >> Mar 13, 2024  8:15 AM Juliana Ocean wrote: Red Word that prompted transfer to Nurse Triage: pt can hardly get his breath when he tries to walk from anywhere.  Wife Debbie calling.  Pt's diaphragm's are paralyzed.  She said Dr Villa Greaser would get him in Reason for Disposition  [1] MILD difficulty breathing (e.g., minimal/no SOB at rest, SOB with walking, pulse <100) AND [2] NEW-onset or WORSE than normal  Answer Assessment - Initial Assessment Questions E2C2 Pulmonary Triage - Initial Assessment Questions "Chief Complaint (e.g., cough, sob, wheezing, fever, chills, sweat or additional symptoms) *Go to specific symptom protocol after initial questions. Shortness of Breath  "How long have symptoms been present?" About 3-4 weeks  Have you tested for COVID or Flu? Note: If not, ask patient if a home test can be taken. If so, instruct patient to call back for positive results. No  MEDICINES:   "Have you used any OTC meds to help with symptoms?" Yes If yes, ask "What medications?" Mucinex a few days about 3 weeks ago  "Have you used your inhalers/maintenance medication?" Yes If yes, "What medications?" Symbicort --Inhale 2 puffs into the lungs 2 (two) times daily  If inhaler, ask "How many puffs and how often?" Note: Review instructions on medication in the chart. Symbicort --Inhale 2 puffs into the lungs 2 (two) times  daily  OXYGEN: "Do you wear supplemental oxygen?" No If yes, "How many liters are you supposed to use?" [name]  "Do you monitor your oxygen levels?" Yes If yes, "What is your reading (oxygen level) today?" 95%  64-65 heart rate  "What is your usual oxygen saturation reading?"  (Note: Pulmonary O2 sats should be 90% or greater) Mostly around 93%   1. RESPIRATORY STATUS: "Describe your breathing?" (e.g., wheezing, shortness of breath, unable to speak, severe coughing)      Shortness of breath---worse on exertion in the past 3-4 weeks 2. ONSET: "When did this breathing problem begin?"      Gotten worse in the past 3-4 weeks 3. PATTERN "Does the difficult breathing come and go, or has it been constant since it started?"      Worse on exertion 4. SEVERITY: "How bad is your breathing?" (e.g., mild, moderate, severe)    - MILD: No SOB at rest, mild SOB with walking, speaks normally in sentences, can lie down, no retractions, pulse < 100.    - MODERATE: SOB at rest, SOB with minimal exertion and prefers to sit, cannot lie down flat, speaks in phrases, mild retractions, audible wheezing, pulse 100-120.    - SEVERE: Very SOB at rest, speaks in single words, struggling to breathe, sitting hunched forward, retractions, pulse > 120      Mild---only worse on exertion 5. RECURRENT SYMPTOM: "Have you had difficulty breathing before?" If Yes, ask: "When was the last time?" and "What happened that time?"      "  Maybe when I first came out of the hospital   I just noticed that I get easily winded when walking" 6. CARDIAC HISTORY: "Do you have any history of heart disease?" (e.g., heart attack, angina, bypass surgery, angioplasty)      Significant 7. LUNG HISTORY: "Do you have any history of lung disease?"  (e.g., pulmonary embolus, asthma, emphysema)     Obstructive Sleep Apnea, Diaphragm paralysis for 2-3 years ago per patient 8. CAUSE: "What do you think is causing the breathing problem?"      Unsure 9.  OTHER SYMPTOMS: "Do you have any other symptoms? (e.g., dizziness, runny nose, cough, chest pain, fever)     No 12. TRAVEL: "Have you traveled out of the country in the last month?" (e.g., travel history, exposures)       No  Protocols used: Breathing Difficulty-A-AH

## 2024-03-14 ENCOUNTER — Encounter: Payer: Self-pay | Admitting: Pulmonary Disease

## 2024-03-14 ENCOUNTER — Ambulatory Visit (INDEPENDENT_AMBULATORY_CARE_PROVIDER_SITE_OTHER): Admitting: Pulmonary Disease

## 2024-03-14 ENCOUNTER — Ambulatory Visit

## 2024-03-14 VITALS — BP 152/94 | HR 63 | Ht 66.0 in | Wt 228.2 lb

## 2024-03-14 DIAGNOSIS — J986 Disorders of diaphragm: Secondary | ICD-10-CM

## 2024-03-14 DIAGNOSIS — R0602 Shortness of breath: Secondary | ICD-10-CM | POA: Diagnosis not present

## 2024-03-14 DIAGNOSIS — G4733 Obstructive sleep apnea (adult) (pediatric): Secondary | ICD-10-CM

## 2024-03-14 NOTE — Patient Instructions (Signed)
 We will get chest x-ray today  Exercise activities as tolerated  We will send supplies to the medical supply company regarding your BiPAP  The compliance from your BiPAP shows it is working well  I will see you in 3 months  The devices we looked at online- -one is called power lung -The other one is called IMT/PEP -Both of these devices are muscle trainers which can help breathing muscles

## 2024-03-14 NOTE — Progress Notes (Unsigned)
 [name] [name]    [id number]    05/22/1951  Primary Care Physician:Blair, Glenn Lange, FNP  Referring Physician: Daphney Eans, FNP [address] [place],  [place] [address]  Chief complaint:   In for follow-up of obstructive sleep apnea  HPI:  Patient with obstructive sleep apnea on BiPAP therapy  Has a lot of comorbidities including hypertension, coronary artery disease, history of heart failure, atrial fibrillation, hypertension, spinal stenosis, hyperlipidemia,  Has been on BiPAP therapy since October 2022  Diaphragmatic paralysis post neck surgery in January 2021 following a cervical discectomy  Pets: Occupation: Exposures: Smoking history: Travel history: Relevant family history:  Outpatient Encounter Medications as of 03/14/2024  Medication Sig  . acetaminophen  (TYLENOL ) 325 MG tablet Take 2 tablets (650 mg total) by mouth every 4 (four) hours as needed for mild pain ((score 1 to 3) or temp > 100.5).  . albuterol  (VENTOLIN  HFA) 108 (90 Base) MCG/ACT inhaler Inhale 2 puffs into the lungs every 6 (six) hours as needed.  . apixaban  (ELIQUIS ) 5 MG TABS tablet Take 1 tablet (5 mg total) by mouth 2 (two) times daily.  . atorvastatin  (LIPITOR) 20 MG tablet Take 1 tablet (20 mg total) by mouth daily.  . budesonide -formoterol  (SYMBICORT ) 80-4.5 MCG/ACT inhaler Inhale 2 puffs into the lungs 2 (two) times daily.  . Coenzyme Q10 (CO Q 10 PO) Take 100 mg by mouth daily.  Aaron Aas dicyclomine (BENTYL) 20 MG tablet Take 20 mg by mouth 4 (four) times daily.  . diltiazem  (CARDIZEM  CD) 180 MG 24 hr capsule Take 1 capsule (180 mg total) by mouth daily.  . ezetimibe  (ZETIA ) 10 MG tablet Take 1 tablet (10 mg total) by mouth daily.  . furosemide  (LASIX ) 20 MG tablet Take 2 tablets (40 mg total) by mouth daily.  . hydrALAZINE  (APRESOLINE ) 100 MG tablet Take 1 tablet (100 mg total) by mouth 3 (three) times daily.  . irbesartan  (AVAPRO ) 300 MG tablet Take 1 tablet (300 mg total)  by mouth daily.  . levalbuterol  (XOPENEX ) 0.63 MG/3ML nebulizer solution Take 3 mLs (0.63 mg total) by nebulization every 6 (six) hours as needed for wheezing or shortness of breath.  . LORazepam  (ATIVAN ) 0.5 MG tablet Take 0.5 mg by mouth every 8 (eight) hours.  . metoprolol  tartrate (LOPRESSOR ) 25 MG tablet Take 1 tablet (25 mg total) by mouth 2 (two) times daily. May take an additional tablet once a day if heart rate is greater than 110  . Multiple Vitamin (MULTIVITAMIN) tablet Take 1 tablet by mouth daily.  . omeprazole  (PRILOSEC) 20 MG capsule TAKE 1 CAPSULE BY MOUTH EVERY OTHER DAY  . PRESCRIPTION MEDICATION Pt uses BiPAP machine at bedtime  . sertraline  (ZOLOFT ) 100 MG tablet Take 100 mg by mouth daily.   No facility-administered encounter medications on file as of 03/14/2024.    Allergies as of 03/14/2024 - Review Complete 02/11/2024  Allergen Reaction Noted  . Vancomycin Anaphylaxis, Shortness Of Breath, and Other (See Comments)   . Aldactone  [spironolactone ]  09/20/2018  . Chlorthalidone  Other (See Comments) 09/20/2018  . Nsaids Other (See Comments)   . Coreg  [carvedilol ] Other (See Comments) 12/15/2021    Past Medical History:  Diagnosis Date  . Anxiety   . CHF (congestive heart failure) (HCC)    h/o due to fluid overload in recovery room  . Coronary artery disease    2v CABG, 2004 [facility])  . COVID-19   . DJD (degenerative joint disease)   .  Dyspnea    on exertion  . GERD (gastroesophageal reflux disease)   . History of kidney stones    3 stones yrs. ago  . HLD (hyperlipidemia)   . Hypertension   . MI, old 2004  . OA (osteoarthritis)   . Obesity   . Persistent atrial fibrillation (HCC)    failed medical therapy with sotalol , s/p PVI x 2  . Sleep apnea    on CPAP  . Spinal stenosis   . Typical atrial flutter [facility])     Past Surgical History:  Procedure Laterality Date  . ANTERIOR CERVICAL DECOMP/DISCECTOMY FUSION [name] 09/21/2019   Procedure: ANTERIOR CERVICAL  DECOMPRESSION/DISCECTOMY FUSION CERVICAL THREE- CERVICAL FOUR, CERVICAL FOUR- CERVICAL FIVE, CERVICAL FIVE- CERVICAL SIX;  Surgeon: Garry Kansas, MD;  Location: [facility] OR;  Service: Neurosurgery;  Laterality: [name];  ANTERIOR CERVICAL DECOMPRESSION/DISCECTOMY FUSION CERVICAL THREE- CERVICAL FOUR, CERVICAL FOUR- CERVICAL FIVE, CERVICAL FIVE- CERVICAL SIX  . ATRIAL FIBRILLATION ABLATION [name] 05/24/2018   Procedure: ATRIAL FIBRILLATION ABLATION;  Surgeon: Jolly Needle, MD;  Location: [facility] INVASIVE CV LAB;  Service: Cardiovascular;  Laterality: [name];  . BACK SURGERY  2011  . CARPAL TUNNEL RELEASE     bilateral  . CHOLECYSTECTOMY    . CORONARY ARTERY BYPASS GRAFT     2004  . ELECTROPHYSIOLOGIC STUDY [name] 09/15/2016   Procedure: Atrial Fibrillation Ablation;  Surgeon: Jolly Needle, MD;  Location: [facility] INVASIVE CV LAB;  Service: Cardiovascular;  Laterality: [name];  . fistula surgery    . hemorrhoidectomy    . LEFT HEART CATH AND CORS/GRAFTS ANGIOGRAPHY [name] 02/07/2018   Procedure: LEFT HEART CATH AND CORS/GRAFTS ANGIOGRAPHY;  Surgeon: [name], Peter M, MD;  Location: [facility] INVASIVE CV LAB;  Service: Cardiovascular;  Laterality: [name];  . LUMBAR LAMINECTOMY    . right foot fracture    . TEE WITHOUT CARDIOVERSION [name] 09/15/2016   Procedure: TRANSESOPHAGEAL ECHOCARDIOGRAM (TEE);  Surgeon: Lenise Quince, MD;  Location: [facility] ENDOSCOPY;  Service: Cardiovascular;  Laterality: [name];  . TOTAL KNEE ARTHROPLASTY Left 07/07/2017   Procedure: LEFT TOTAL KNEE ARTHROPLASTY;  Surgeon: Hazle Lites, MD;  Location: [facility] ORS;  Service: Orthopedics;  Laterality: Left;  . TRANSTHORACIC ECHOCARDIOGRAM  08/25/2010   EF 55-60%    Family History  Problem Relation Age of Onset  . Breast cancer Mother   . Hypertension Mother   . Coronary artery disease Father   . Stroke Father   . Hypertension Father     Social History   Socioeconomic History  . Marital status: Married    Spouse name: Not on file  . Number of children: 0  . Years  of education: Not on file  . Highest education level: Not on file  Occupational History  . Occupation: Architect: [employer]  Tobacco Use  . Smoking status: Never  . Smokeless tobacco: Never  Vaping Use  . Vaping status: Never Used  Substance and Sexual Activity  . Alcohol use: Yes    Alcohol/week: 0.0 standard drinks of alcohol    Comment: ocassional  . Drug use: No  . Sexual activity: Yes    Partners: Female  Other Topics Concern  . Not on file  Social History Narrative  . Not on file   Social Drivers of Health   Financial Resource Strain: Not on file  Food Insecurity: Not on file  Transportation Needs: Not on file  Physical Activity: Sufficiently Active (02/05/2018)   Exercise Vital Sign   . Days of Exercise per Week:  7 days   . Minutes of Exercise per Session: 30 min  Stress: No Stress Concern Present (02/05/2018)   [employer] of Occupational Health - Occupational Stress Questionnaire   . Feeling of Stress : Only a little  Social Connections: Not on file  Intimate Partner Violence: Not on file    Review of Systems  Vitals:   03/14/24 0944  BP: (!) 152/94  Pulse: 63  SpO2: 96%     Physical Exam   Data Reviewed: ***  Assessment:  ***  Plan/Recommendations: ***   Myer Artis MD [facility] 03/14/2024, 9:50 AM  CC: Blair, Diane W, FNP

## 2024-06-16 ENCOUNTER — Telehealth: Payer: Self-pay

## 2024-06-16 NOTE — Telephone Encounter (Signed)
 Called patient to bring sd card or machine to appointment on Monday verbalized understanding

## 2024-06-19 ENCOUNTER — Encounter: Payer: Self-pay | Admitting: Pulmonary Disease

## 2024-06-19 ENCOUNTER — Ambulatory Visit: Admitting: Pulmonary Disease

## 2024-06-19 VITALS — BP 159/88 | HR 54 | Temp 97.9°F | Ht 66.0 in | Wt 235.0 lb

## 2024-06-19 DIAGNOSIS — G4733 Obstructive sleep apnea (adult) (pediatric): Secondary | ICD-10-CM

## 2024-06-19 DIAGNOSIS — Z9989 Dependence on other enabling machines and devices: Secondary | ICD-10-CM | POA: Diagnosis not present

## 2024-06-19 MED ORDER — ALBUTEROL SULFATE HFA 108 (90 BASE) MCG/ACT IN AERS: 2.0000 | INHALATION_SPRAY | Freq: Four times a day (QID) | RESPIRATORY_TRACT | 2 refills | Status: AC | PRN

## 2024-06-19 NOTE — Progress Notes (Signed)
 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   albuterol  (VENTOLIN  HFA) 108 (90 Base) MCG/ACT inhaler Inhale 2 puffs into the lungs every 6 (six) hours as needed.   apixaban  (ELIQUIS ) 5 MG TABS tablet Take 1 tablet (5 mg total) by mouth 2 (two) times daily.   atorvastatin  (LIPITOR) 20 MG tablet Take 1 tablet (20 mg total) by mouth daily.   Coenzyme Q10 (CO Q 10 PO) Take 100 mg by mouth daily.   budesonide -formoterol  (SYMBICORT ) 80-4.5 MCG/ACT inhaler Inhale 2 puffs into the lungs 2 (two) times daily. (Patient not taking: Reported on 06/19/2024)   dicyclomine (BENTYL) 20 MG tablet Take 20 mg by mouth 4 (four) times daily.   diltiazem  (CARDIZEM  CD) 180 MG 24 hr capsule Take 1 capsule (180 mg total) by mouth daily.   ezetimibe  (ZETIA ) 10 MG tablet Take 1 tablet (10 mg total) by mouth daily.   furosemide   (LASIX ) 20 MG tablet Take 2 tablets (40 mg total) by mouth daily.   hydrALAZINE  (APRESOLINE ) 100 MG tablet Take 1 tablet (100 mg total) by mouth 3 (three) times daily.   irbesartan  (AVAPRO ) 300 MG tablet Take 1 tablet (300 mg total) by mouth daily.   levalbuterol  (XOPENEX ) 0.63 MG/3ML nebulizer solution Take 3 mLs (0.63 mg total) by nebulization every 6 (six) hours as needed for wheezing or shortness of breath.   LORazepam  (ATIVAN ) 0.5 MG tablet Take 0.5 mg by mouth every 8 (eight) hours.   metoprolol  tartrate (LOPRESSOR ) 25 MG tablet Take 1 tablet (25 mg total) by mouth 2 (two) times daily. May take an additional tablet once a day if heart rate is greater than 110   Multiple Vitamin (MULTIVITAMIN) tablet Take 1 tablet by mouth daily.   omeprazole  (PRILOSEC) 20 MG capsule TAKE 1 CAPSULE BY MOUTH EVERY OTHER DAY   PRESCRIPTION MEDICATION Pt uses BiPAP machine at bedtime   sertraline  (ZOLOFT ) 100 MG tablet Take 100 mg by mouth daily.   No facility-administered encounter medications on file as of 06/19/2024.    Allergies as of 06/19/2024 - Review Complete 06/19/2024  Allergen Reaction Noted   Vancomycin Anaphylaxis, Shortness Of Breath, and Other (See Comments)    Aldactone  [spironolactone ]  09/20/2018   Chlorthalidone  Other (See Comments) 09/20/2018   Nsaids Other (See Comments)    Coreg  [carvedilol ] Other (See Comments) 12/15/2021    Past Medical History:  Diagnosis Date  Anxiety    CHF (congestive heart failure) (HCC)    h/o due to fluid overload in recovery room   Coronary artery disease    2v CABG, 2004 [facility])   COVID-19    DJD (degenerative joint disease)    Dyspnea    on exertion   GERD (gastroesophageal reflux disease)    History of kidney stones    3 stones yrs. ago   HLD (hyperlipidemia)    Hypertension    MI, old 2004   OA (osteoarthritis)    Obesity    Persistent atrial fibrillation (HCC)    failed medical therapy with sotalol , s/p PVI x 2   Sleep apnea    on  CPAP   Spinal stenosis    Typical atrial flutter [facility])     Past Surgical History:  Procedure Laterality Date   ANTERIOR CERVICAL DECOMP/DISCECTOMY FUSION [name] 09/21/2019   Procedure: ANTERIOR CERVICAL DECOMPRESSION/DISCECTOMY FUSION CERVICAL THREE- CERVICAL FOUR, CERVICAL FOUR- CERVICAL FIVE, CERVICAL FIVE- CERVICAL SIX;  Surgeon: Mavis Purchase, MD;  Location: [facility] OR;  Service: Neurosurgery;  Laterality: [name];  ANTERIOR CERVICAL DECOMPRESSION/DISCECTOMY FUSION CERVICAL THREE- CERVICAL FOUR, CERVICAL FOUR- CERVICAL FIVE, CERVICAL FIVE- CERVICAL SIX   ATRIAL FIBRILLATION ABLATION [name] 05/24/2018   Procedure: ATRIAL FIBRILLATION ABLATION;  Surgeon: Kelsie Agent, MD;  Location: [facility] INVASIVE CV LAB;  Service: Cardiovascular;  Laterality: [name];   BACK SURGERY  2011   CARPAL TUNNEL RELEASE     bilateral   CHOLECYSTECTOMY     CORONARY ARTERY BYPASS GRAFT     2004   ELECTROPHYSIOLOGIC STUDY [name] 09/15/2016   Procedure: Atrial Fibrillation Ablation;  Surgeon: Agent Kelsie, MD;  Location: [facility] INVASIVE CV LAB;  Service: Cardiovascular;  Laterality: [name];   fistula surgery     hemorrhoidectomy     LEFT HEART CATH AND CORS/GRAFTS ANGIOGRAPHY [name] 02/07/2018   Procedure: LEFT HEART CATH AND CORS/GRAFTS ANGIOGRAPHY;  Surgeon: [name], Peter M, MD;  Location: [facility] INVASIVE CV LAB;  Service: Cardiovascular;  Laterality: [name];   LUMBAR LAMINECTOMY     right foot fracture     TEE WITHOUT CARDIOVERSION [name] 09/15/2016   Procedure: TRANSESOPHAGEAL ECHOCARDIOGRAM (TEE);  Surgeon: Redell GORMAN Shallow, MD;  Location: [facility] ENDOSCOPY;  Service: Cardiovascular;  Laterality: [name];   TOTAL KNEE ARTHROPLASTY Left 07/07/2017   Procedure: LEFT TOTAL KNEE ARTHROPLASTY;  Surgeon: Heide Ingle, MD;  Location: [facility] ORS;  Service: Orthopedics;  Laterality: Left;   TRANSTHORACIC ECHOCARDIOGRAM  08/25/2010   EF 55-60%    Family History  Problem Relation Age of Onset   Breast cancer Mother    Hypertension Mother    Coronary artery disease  Father    Stroke Father    Hypertension Father     Social History   Socioeconomic History   Marital status: Married    Spouse name: Not on file   Number of children: 0   Years of education: Not on file   Highest education level: Not on file  Occupational History   Occupation: Architect: [employer]  Tobacco Use   Smoking status: Never   Smokeless tobacco: Never  Vaping Use   Vaping status: Never Used  Substance and Sexual Activity   Alcohol use: Yes    Alcohol/week: 0.0 standard drinks of alcohol    Comment: ocassional   Drug use: No   Sexual activity: Yes    Partners: Female  Other Topics Concern   Not on file  Social History Narrative   Not on file  Social Drivers of Corporate investment banker Strain: Not on file  Food Insecurity: Not on file  Transportation Needs: Not on file  Physical Activity: Sufficiently Active (02/05/2018)   Exercise Vital Sign    Days of Exercise per Week: 7 days    Minutes of Exercise per Session: 30 min  Stress: No Stress Concern Present (02/05/2018)   [employer] of Occupational Health - Occupational Stress Questionnaire    Feeling of Stress : Only a little  Social Connections: Not on file  Intimate Partner Violence: Not on file    Review of Systems  Respiratory:  Positive for apnea and shortness of breath.   Psychiatric/Behavioral:  Positive for sleep disturbance.     There were no vitals filed for this visit.    Physical Exam Constitutional:      Appearance: He is obese.  HENT:     Head: Normocephalic.     Mouth/Throat:     Mouth: Mucous membranes are moist.  Eyes:     General: No scleral icterus. Cardiovascular:     Rate and Rhythm: Normal rate and regular rhythm.     Heart sounds: No murmur heard.    No friction rub.  Pulmonary:     Effort: No respiratory distress.     Breath sounds: No stridor. No wheezing or rhonchi.     Comments: Decreased air movement bilaterally worse at the  left base Musculoskeletal:     Cervical back: No rigidity or tenderness.  Neurological:     Mental Status: He is alert.  Psychiatric:        Mood and Affect: Mood normal.    Data Reviewed: Download from his BiPAP shows 100% compliance over the last 90 days average use of 9 hours 51 minutes He is on 15/8 with a pressure support of 4 Residual AHI 1.2 - Comparable with last download  BiPAP titration October 2022 reviewed  Sleep study from 2011 reviewed  Assessment:  Obstructive sleep apnea -Compliant with BiPAP - Benefiting from BiPAP use  Multifactorial shortness of breath  Chest x-ray looks stable Diaphragmatic paralysis Elevated left hemidiaphragm   Plan/Recommendations: Continue BiPAP nightly  Diaphragmatic paralysis - Appears to be stable  Continue graded activities as tolerated  Follow-up in 6 months  Jennet Epley MD  Pulmonary and Critical Care 06/19/2024, 1:26 PM  CC: Blair, Diane W, FNP

## 2024-06-19 NOTE — Patient Instructions (Signed)
 Your BiPAP is working very well Events are well-controlled  Will place a prescription into the medical supply company for your mask  Refills for your inhaler sent in  Follow-up in 6 months

## 2024-06-23 ENCOUNTER — Telehealth: Payer: Self-pay

## 2024-06-23 NOTE — Telephone Encounter (Signed)
 Copied from CRM [id number]. Topic: Clinical - Order For Equipment >> Jun 23, 2024  9:16 AM [name] [name] wrote: Reason for CRM: Patient called in regarding bipap supplies. Patient stated he discussed this with Dr.Olalere. Patient stated the women at [facility] needs an order for the supplies. Patient was thankful and verbalized understanding.   Order placed 9/15 to [facility] for new mask.

## 2024-06-28 NOTE — Telephone Encounter (Signed)
 Copied from CRM [id number]. Topic: Clinical - Order For Equipment >> Jun 28, 2024  9:21 AM Ismael A wrote: Reason for CRM: [name] from [facility] [phone] calling in to have order for BIPAP supplies signed as soon as possible

## 2024-06-28 NOTE — Telephone Encounter (Signed)
 Copied from CRM #[id number]. Topic: Clinical - Order For Equipment >> Jun 28, 2024  4:43 PM Devaughn RAMAN wrote: Reason for CRM: Patient called regarding order for mask from [facility]. Patient stated [facility] advised him Dr.Olalere needed to sign and send the order back to them. Contacted [facility] [place] spoke with [place] attempted to get a clinical staff on the line currently busy, advised patient a follow up callback would be made. Patient was thankful and verbalized understanding.

## 2024-06-30 NOTE — Telephone Encounter (Signed)
 Copied from CRM #[id number]. Topic: Clinical - Order For Equipment >> Jun 27, 2024  5:23 PM Rozanna G wrote: Reason for CRM: Pt spouse called to check on his order for BiPAP supplies and still no information. Stated they called [facility] and they are saying they do not have the order.  Per note showing order sent on 09/15.    [facility] has received signed CMN   -NFN

## 2024-07-20 ENCOUNTER — Ambulatory Visit (HOSPITAL_BASED_OUTPATIENT_CLINIC_OR_DEPARTMENT_OTHER): Admitting: Internal Medicine

## 2024-07-21 ENCOUNTER — Telehealth (HOSPITAL_BASED_OUTPATIENT_CLINIC_OR_DEPARTMENT_OTHER): Payer: Self-pay

## 2024-07-21 NOTE — Telephone Encounter (Signed)
 CMN received for CPAP supplies from [facility] signed by provider and faxed confirmation received

## 2024-07-27 ENCOUNTER — Telehealth (HOSPITAL_BASED_OUTPATIENT_CLINIC_OR_DEPARTMENT_OTHER): Payer: Self-pay | Admitting: *Deleted

## 2024-07-27 NOTE — Telephone Encounter (Signed)
   Pre-operative Risk Assessment    Patient Name: [name] [name]  DOB: 04-03-1951 MRN: [id number]   Date of last office visit: 02/21/2024 Date of next office visit: 08/16/2024   Request for Surgical Clearance    Procedure:  Lumbar Epidural  Date of Surgery:  Clearance TBD                                 Surgeon:  Dr. Deatrice Manus Surgeon's Group or Practice Name:  [facility] Phone number:  [phone] Fax number:  [phone]   Type of Clearance Requested:   - Medical  - Pharmacy:  Hold Apixaban  (Eliquis ) 3 days prior and continue day after.   Type of Anesthesia:  None    Additional requests/questions:  Added EKG to upcoming appt notes.  Please delete if not needed.  Signed, Edsel Grayce Sanders   07/27/2024, 10:10 AM

## 2024-07-27 NOTE — Telephone Encounter (Signed)
 Patient is scheduled for an in office appointment on 08/16/24 with Dr. Dorn Ross, MD. At that time it has been noted that he will also have pre-op clearance. Upon speaking to [name], he asked me why he needed clearance and if it was completely necessary. I explained to him that we had the clearance they are requesting cardiac clearance due to the medicine he is on. He asked if he needed to cancel his appointment with pain clinic on Monday. I told him I had no control over if he wanted to cancel that appointment or not. He understood that he needed to come to his appointment on 08/16/24 for pre-op clearance.

## 2024-07-27 NOTE — Telephone Encounter (Signed)
   Name: SHAYAAN PARKE  DOB: May 21, 1951  MRN: [id number]  Primary Cardiologist: Alvan Carrier, MD  Chart reviewed as part of pre-operative protocol coverage. Because of Momen R Galambos's past medical history and time since last visit, he will require a follow-up in-office visit in order to better assess preoperative cardiovascular risk.  Patient lives in [place] , therefore, we are unable to complete a televisit.  Pre-op covering staff: - Please schedule appointment and call patient to inform them. If patient already had an upcoming appointment within acceptable timeframe, please add pre-op clearance to the appointment notes so provider is aware. - Please contact requesting surgeon's office via preferred method (i.e, phone, fax) to inform them of need for appointment prior to surgery.  This message will also be routed to pharmacy pool for input on holding Eliquis  as requested below so that this information is available to the clearing provider at time of patient's appointment.   Damien JAYSON Braver, NP  07/27/2024, 4:35 PM

## 2024-08-03 NOTE — Telephone Encounter (Signed)
 Patient with diagnosis of afib on Eliquis  for anticoagulation.    Procedure: Lumbar Epidural  Date of procedure: TBD   CHA2DS2-VASc Score = 4   This indicates a 4.8% annual risk of stroke. The patient's score is based upon: CHF History: 1 HTN History: 1 Diabetes History: 0 Stroke History: 0 Vascular Disease History: 1 Age Score: 1 Gender Score: 0      CrCl 72 ml/min Platelet count 312  Patient has not had an Afib/aflutter ablation in the last 3 months, DCCV within the last 4 weeks or a watchman implanted in the last 45 days   Per office protocol, patient can hold Eliquis  for 3 days prior to procedure.    **This guidance is not considered finalized until pre-operative APP has relayed final recommendations.**

## 2024-08-15 NOTE — Telephone Encounter (Signed)
 Caller (Lauren) called to follow-up on the status of patient's clearance.

## 2024-08-15 NOTE — Telephone Encounter (Signed)
 Pre-op team, is the caller Lauren from the surgeon's office?   His preoperative clearance will be addressed at his in-office visit scheduled for tomorrow.

## 2024-08-15 NOTE — Telephone Encounter (Signed)
 Pt has appt 08/16/24 with Dr. Alvan. Appt notes reflect preop clearance needed.   Will update the surgeon office.

## 2024-08-16 ENCOUNTER — Ambulatory Visit: Attending: Cardiology | Admitting: Cardiology

## 2024-08-16 ENCOUNTER — Encounter: Payer: Self-pay | Admitting: Cardiology

## 2024-08-16 VITALS — BP 120/70 | HR 62 | Ht 66.0 in | Wt 228.6 lb

## 2024-08-16 DIAGNOSIS — R7989 Other specified abnormal findings of blood chemistry: Secondary | ICD-10-CM | POA: Diagnosis present

## 2024-08-16 DIAGNOSIS — I10 Essential (primary) hypertension: Secondary | ICD-10-CM | POA: Insufficient documentation

## 2024-08-16 DIAGNOSIS — I2510 Atherosclerotic heart disease of native coronary artery without angina pectoris: Secondary | ICD-10-CM | POA: Insufficient documentation

## 2024-08-16 DIAGNOSIS — I480 Paroxysmal atrial fibrillation: Secondary | ICD-10-CM | POA: Diagnosis not present

## 2024-08-16 DIAGNOSIS — R739 Hyperglycemia, unspecified: Secondary | ICD-10-CM | POA: Insufficient documentation

## 2024-08-16 DIAGNOSIS — E782 Mixed hyperlipidemia: Secondary | ICD-10-CM | POA: Insufficient documentation

## 2024-08-16 DIAGNOSIS — D6869 Other thrombophilia: Secondary | ICD-10-CM | POA: Diagnosis not present

## 2024-08-16 MED ORDER — APIXABAN 5 MG PO TABS: 5.0000 mg | ORAL_TABLET | Freq: Two times a day (BID) | ORAL | 3 refills | Status: AC

## 2024-08-16 MED ORDER — ATORVASTATIN CALCIUM 20 MG PO TABS: 20.0000 mg | ORAL_TABLET | Freq: Every day | ORAL | 3 refills | Status: AC

## 2024-08-16 MED ORDER — IRBESARTAN 300 MG PO TABS: 300.0000 mg | ORAL_TABLET | Freq: Every day | ORAL | 3 refills | Status: AC

## 2024-08-16 MED ORDER — DILTIAZEM HCL ER COATED BEADS 180 MG PO CP24
180.0000 mg | ORAL_CAPSULE | Freq: Every day | ORAL | 3 refills | Status: AC
Start: 2024-08-16 — End: ?

## 2024-08-16 MED ORDER — METOPROLOL TARTRATE 25 MG PO TABS: 25.0000 mg | ORAL_TABLET | Freq: Two times a day (BID) | ORAL | 3 refills | Status: AC

## 2024-08-16 MED ORDER — FUROSEMIDE 20 MG PO TABS: 40.0000 mg | ORAL_TABLET | Freq: Every day | ORAL | 3 refills | Status: AC

## 2024-08-16 MED ORDER — HYDRALAZINE HCL 100 MG PO TABS: 100.0000 mg | ORAL_TABLET | Freq: Three times a day (TID) | ORAL | 3 refills | Status: AC

## 2024-08-16 NOTE — Progress Notes (Signed)
 Clinical Summary Mr. Houdeshell is a 73 y.o.male seen today for follow up of the following medical problems.      1. HTN      Headaches on hydralazine , stopped however now back on. Started on prazosin  due to lack of other options, stopped due to severe dizziness. Dilt prevoiusly lowered from 360 to 180 for possible fatigue and headaches. Off prazosin  due to dizziness. Aldaconte caused gynecomastia, eplrenone caused upset stomach. Coreg  caused higher bp's per his report, changed back to metprolol  - bp with pcp 133/76. - home bp's vary to some degree.      - home bp's typically 110s-140s/70s. HRs typically 60s - often elevated in clinic but typically controled at home   2. CAD   - prior 2 vessel CABG in 2004 at [facility]   - apparently had follow up cath in 2006 from [place] with patent grafts. Reports negative stress test in 2011 by Dr Cleotilde in [place].  - 03/2015 Lexiscan  MPI no ischemia - long history of chronic atypical muskoloskeltal chest pain        - 02/2018 seen in clinic with symptoms concernign for unstable angina - 02/2018 cath with native disease and patent grafts      - chronic neck and arm pains, prior neck surgery - right and and left chest pain, dull/pressure. Can last hours at a time.  -chronic stable symptoms unchanged -sedentary lifestyle due to chronic back pains.    Chronic symptoms unchanged, no exertional symptoms.    3. OSA   - compliant with bipap - followed by Dr Jude.      4. Hyperlipidemia   - noted some side effects on higher lipitor doses, has tolerated lower dose well  - PA Peck started zetia  12/2022 - 02/2023 TC 154 TG 217 HDL 45 LDL 66   08/2023 TC 176 TG 140 HDL 45 LDL 103 - had discussed adding zetia  at prior appointments but has not been started.     - reports on zetia  he had palpitations, since stopping symptoms have resolved.    5. Afib - s/p ablation. He had failed rate control as well as multiple  antiarrhythmics - no recent palpitations. No bleeding on eliquis    - recurrent afib 02/2018 - 05/2018 repeat ablation.  - did not tolerate higher doses of beta blocker - no recent symptoms   - EKG today shows SR, PACs - no significant symptoms - compliant with meds. No bleeding on eliquis    6. 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    - chronic symptoms overall changed     7. Chronic neck/back pain - followed in pain clinic -* needs clerance for lumbar epidural, do be off eliquis   8. Basal cell skin cancer face - receiving radiation Past Medical History:  Diagnosis Date   Anxiety    CHF (congestive heart failure) (HCC)    h/o due to fluid overload in recovery room   Coronary artery disease    2v CABG, 2004 ([facility])   COVID-19    DJD (degenerative joint disease)    Dyspnea    on exertion   GERD (gastroesophageal reflux disease)    History of kidney stones    3 stones yrs. ago   HLD (hyperlipidemia)    Hypertension    MI, old 2004   OA (osteoarthritis)    Obesity    Persistent  atrial fibrillation (HCC)    failed medical therapy with sotalol , s/p PVI x 2   Sleep apnea    on CPAP   Spinal stenosis    Typical atrial flutter (HCC)      Allergies  Allergen Reactions   Vancomycin Anaphylaxis, Shortness Of Breath and Other (See Comments)    Was informed after heart surgery Breathing problem patient unsure   Aldactone  [Spironolactone ]     GYNECOMASTIA    Chlorthalidone  Other (See Comments)    GOUT   Nsaids Other (See Comments)    On blood thinner   Coreg  [Carvedilol ] Other (See Comments)    Gout flare in ankle      Current Outpatient Medications  Medication Sig Dispense Refill   acetaminophen  (TYLENOL ) 325 MG tablet Take 2 tablets (650 mg total) by mouth every 4 (four) hours as needed for mild pain ((score 1 to 3) or temp > 100.5).      albuterol  (VENTOLIN  HFA) 108 (90 Base) MCG/ACT inhaler Inhale 2 puffs into the lungs every 6 (six) hours as needed. 18 g 2   apixaban  (ELIQUIS ) 5 MG TABS tablet Take 1 tablet (5 mg total) by mouth 2 (two) times daily. 180 tablet 3   atorvastatin  (LIPITOR) 20 MG tablet Take 1 tablet (20 mg total) by mouth daily. 90 tablet 3   budesonide -formoterol  (SYMBICORT ) 80-4.5 MCG/ACT inhaler Inhale 2 puffs into the lungs 2 (two) times daily. (Patient not taking: Reported on 06/19/2024) 10.2 g 2   cephALEXin  (KEFLEX ) 500 MG capsule Take 500 mg by mouth 3 (three) times daily.     Coenzyme Q10 (CO Q 10 PO) Take 100 mg by mouth daily.     dicyclomine (BENTYL) 20 MG tablet Take 20 mg by mouth 4 (four) times daily.     diltiazem  (CARDIZEM  CD) 180 MG 24 hr capsule Take 1 capsule (180 mg total) by mouth daily. 90 capsule 3   ezetimibe  (ZETIA ) 10 MG tablet Take 1 tablet (10 mg total) by mouth daily. 90 tablet 3   furosemide  (LASIX ) 20 MG tablet Take 2 tablets (40 mg total) by mouth daily. 180 tablet 3   hydrALAZINE  (APRESOLINE ) 100 MG tablet Take 1 tablet (100 mg total) by mouth 3 (three) times daily. 270 tablet 3   irbesartan  (AVAPRO ) 300 MG tablet Take 1 tablet (300 mg total) by mouth daily. 90 tablet 3   levalbuterol  (XOPENEX ) 0.63 MG/3ML nebulizer solution Take 3 mLs (0.63 mg total) by nebulization every 6 (six) hours as needed for wheezing or shortness of breath. (Patient not taking: Reported on 06/19/2024) 360 mL 2   LORazepam  (ATIVAN ) 0.5 MG tablet Take 0.5 mg by mouth every 8 (eight) hours.     metoprolol  tartrate (LOPRESSOR ) 25 MG tablet Take 1 tablet (25 mg total) by mouth 2 (two) times daily. May take an additional tablet once a day if heart rate is greater than 110 270 tablet 3   Multiple Vitamin (MULTIVITAMIN) tablet Take 1 tablet by mouth daily.     mupirocin ointment (BACTROBAN) 2 % SMARTSIG:1 Application Topical 2-3 Times Daily     omeprazole  (PRILOSEC) 20 MG capsule TAKE 1 CAPSULE BY MOUTH EVERY OTHER  DAY 45 capsule 3   PRESCRIPTION MEDICATION Pt uses BiPAP machine at bedtime     sertraline  (ZOLOFT ) 100 MG tablet Take 100 mg by mouth daily.     No current facility-administered medications for this visit.     Past Surgical History:  Procedure Laterality Date   ANTERIOR CERVICAL DECOMP/DISCECTOMY  FUSION [name] 09/21/2019   Procedure: ANTERIOR CERVICAL DECOMPRESSION/DISCECTOMY FUSION CERVICAL THREE- CERVICAL FOUR, CERVICAL FOUR- CERVICAL FIVE, CERVICAL FIVE- CERVICAL SIX;  Surgeon: Mavis Purchase, MD;  Location[facility];  Service: Neurosurgery;  Laterality: [name];  ANTERIOR CERVICAL DECOMPRESSION/DISCECTOMY FUSION CERVICAL THREE- CERVICAL FOUR, CERVICAL FOUR- CERVICAL FIVE, CERVICAL FIVE- CERVICAL SIX   ATRIAL FIBRILLATION ABLATION [name] 05/24/2018   Procedure: ATRIAL FIBRILLATION ABLATION;  Surgeon: Kelsie Agent, MD;  Location: [facility] INVASIVE CV LAB;  Service: Cardiovascular;  Laterality: [name];   BACK SURGERY  2011   CARPAL TUNNEL RELEASE     bilateral   CHOLECYSTECTOMY     CORONARY ARTERY BYPASS GRAFT     2004   ELECTROPHYSIOLOGIC STUDY [name] 09/15/2016   Procedure: Atrial Fibrillation Ablation;  Surgeon: Agent Kelsie, MD;  Location: [facility] INVASIVE CV LAB;  Service: Cardiovascular;  Laterality: [name];   fistula surgery     hemorrhoidectomy     LEFT HEART CATH AND CORS/GRAFTS ANGIOGRAPHY [name] 02/07/2018   Procedure: LEFT HEART CATH AND CORS/GRAFTS ANGIOGRAPHY;  Surgeon: Jordan, Peter M, MD;  Location: [facility] INVASIVE CV LAB;  Service: Cardiovascular;  Laterality: [name];   LUMBAR LAMINECTOMY     right foot fracture     TEE WITHOUT CARDIOVERSION [name] 09/15/2016   Procedure: TRANSESOPHAGEAL ECHOCARDIOGRAM (TEE);  Surgeon: Redell GORMAN Shallow, MD;  Location: [facility] ENDOSCOPY;  Service: Cardiovascular;  Laterality: [name];   TOTAL KNEE ARTHROPLASTY Left 07/07/2017   Procedure: LEFT TOTAL KNEE ARTHROPLASTY;  Surgeon: Heide Ingle, MD;  Location: [facility] ORS;  Service: Orthopedics;  Laterality: Left;   TRANSTHORACIC ECHOCARDIOGRAM   08/25/2010   EF 55-60%     Allergies  Allergen Reactions   Vancomycin Anaphylaxis, Shortness Of Breath and Other (See Comments)    Was informed after heart surgery Breathing problem patient unsure   Aldactone  [Spironolactone ]     GYNECOMASTIA    Chlorthalidone  Other (See Comments)    GOUT   Nsaids Other (See Comments)    On blood thinner   Coreg  [Carvedilol ] Other (See Comments)    Gout flare in ankle       Family History  Problem Relation Age of Onset   Breast cancer Mother    Hypertension Mother    Coronary artery disease Father    Stroke Father    Hypertension Father      Social History Mr. Kohlmann reports that he has never smoked. He has been exposed to tobacco smoke. He has never used smokeless tobacco. Mr. Brayboy reports current alcohol use.      Physical Examination Today's Vitals   08/16/24 1045  BP: 120/70  Pulse: 62  SpO2: 98%  Weight: 228 lb 9.6 oz (103.7 kg)  Height: 5' 6 (1.676 m)   Body mass index is 36.9 kg/m.  Gen: resting comfortably, no acute distress HEENT: no scleral icterus, pupils equal round and reactive, no palptable cervical adenopathy,  CV: RRR, no m/rg, no jvd Resp: Clear to auscultation bilaterally GI: abdomen is soft, non-tender, non-distended, normal bowel sounds, no hepatosplenomegaly MSK: extremities are warm, no edema.  Skin: warm, no rash Neuro:  no focal deficits Psych: appropriate affect   Diagnostic Studies  03/2015 echo Study Conclusions  - Left ventricle: The cavity size was normal. Wall thickness was   increased in a pattern of mild LVH. Systolic function was normal.   The estimated ejection fraction was in the range of 50% to 55%.   Wall motion was normal; there were no regional wall motion   abnormalities. Doppler parameters are consistent  with high   ventricular filling pressure. - Left atrium: The atrium was mildly dilated.     03/2015 Lexiscan  MPI No T wave inversion was noted during  stress. There was no ST segment deviation noted during stress. The study is normal. This is a low risk study. The left ventricular ejection fraction is normal (55-65%). Nuclear stress EF: 61%.   02/2018 cath Prox LAD lesion is 100% stenosed. Prox Cx to Mid Cx lesion is 60% stenosed. Post Atrio lesion is 100% stenosed. SVG graft was visualized by angiography and is large. The graft exhibits minimal luminal irregularities. LIMA graft was visualized by angiography and is normal in caliber. The graft exhibits no disease. The left ventricular systolic function is normal. LV end diastolic pressure is normal. The left ventricular ejection fraction is 55-65% by visual estimate.   1. 2 vessel occlusive CAD    - 100% proximal LAD    - 60% mid LCx    - 100% PLOM with left to right collaterals. 2. Patent LIMA to the LAD 3. Patent SVG to the diagonal 4. Normal LV function 5. Normal LVEDP     12/2018 heart monitor 48 hr holter monitor Min HR 46, Max HR 106, Avg HR 62 Rare ventricular ectopy, all in the form of isolated PVCs Occasional supraventricular ectopy, primarily as isolated PACs. One 3 beat run of atach. No significant arrhythmias     Assessment and Plan  1. HTN -Side effects with prior agents as documented above - bp at goal, continue curren tmeds   2. Afib/acquired thrombophilia - no symptosm, continue curren tmeds     3. CAD -chronic atypical chest pains unchanged.  -- continue current therapy   4. Hyperlipidemia - LDL above goal. Limited statin tolerance. Did not tolerate zetia  - repeat lipid panel. Pending results can discuss [id number] at f/u      Dorn PHEBE Ross, M.D

## 2024-08-16 NOTE — Patient Instructions (Signed)
 Medication Instructions:  Your physician has recommended you make the following change in your medication:  Stop taking Zetia  Continue taking all other medications as prescribed   Labwork: Fasting Lipid Panel, CBC, CMET, Magnesium , A1C and TSH to be completed fasting at [facility]  Testing/Procedures: None  Follow-Up: Your physician recommends that you schedule a follow-up appointment in: 6 months  Any Other Special Instructions Will Be Listed Below (If Applicable). Thank you for choosing [facility]!     If you need a refill on your cardiac medications before your next appointment, please call your pharmacy.

## 2024-08-18 NOTE — Telephone Encounter (Addendum)
 Thanks Dr. Alvan. Surgeon's office is requesting medical clearance as well. I see you saw him earlier this week so I  just wanted to clarify that he is okay to proceed with procedure?  Please route response back to P CV DIV PREOP.  Thank you! [name] [name]

## 2024-08-21 NOTE — Telephone Encounter (Signed)
     Primary Cardiologist: Alvan Carrier, MD  Chart reviewed as part of pre-operative protocol coverage. Given past medical history and time since last visit, based on ACC/AHA guidelines, [name] [name] would be at acceptable risk for the planned procedure without further cardiovascular testing.   Patient with diagnosis of afib on Eliquis  for anticoagulation.     Procedure: Lumbar Epidural  Date of procedure: TBD     CHA2DS2-VASc Score = 4   This indicates a 4.8% annual risk of stroke. The patient's score is based upon: CHF History: 1 HTN History: 1 Diabetes History: 0 Stroke History: 0 Vascular Disease History: 1 Age Score: 1 Gender Score: 0       CrCl 72 ml/min Platelet count 312   Patient has not had an Afib/aflutter ablation in the last 3 months, DCCV within the last 4 weeks or a watchman implanted in the last 45 days    Per office protocol, patient can hold Eliquis  for 3 days prior to procedure.  I will route this recommendation to the requesting party via Epic fax function and remove from pre-op pool.  Josefa HERO. [name] NP-C     08/21/2024, 4:15 PM [facility] [address] [place], [place] [address] Office [phone]

## 2024-09-12 ENCOUNTER — Telehealth: Payer: Self-pay | Admitting: Cardiology

## 2024-09-12 ENCOUNTER — Encounter: Payer: Self-pay | Admitting: Cardiology

## 2024-09-12 NOTE — Telephone Encounter (Signed)
 Replied via mychart.

## 2024-09-12 NOTE — Telephone Encounter (Signed)
 Wife is calling for lab results. Please advise

## 2024-09-21 ENCOUNTER — Ambulatory Visit: Payer: Self-pay | Admitting: Cardiology

## 2024-09-22 NOTE — Telephone Encounter (Signed)
-----   Message from Dorn Ross, MD sent at 09/21/2024 10:38 AM EST ----- Cholestserol too high, can we refer him to pharmD clinic to discuss options  JINNY Ross MD

## 2024-09-22 NOTE — Telephone Encounter (Addendum)
 The patient has been notified of the result and verbalized understanding.  All questions (if any) were answered. Rosina JAYSON Cornea, CMA 09/22/2024 11:15 AM   Patient would like to try to get cholesterol down himself first.

## 2025-01-22 ENCOUNTER — Ambulatory Visit: Admitting: Pulmonary Disease
